# Patient Record
Sex: Female | Born: 1950
Health system: Southern US, Community
[De-identification: ages and names within clinical notes are randomized; demographics above are authoritative.]

## PROBLEM LIST (undated history)

## (undated) DIAGNOSIS — I5032 Chronic diastolic (congestive) heart failure: Secondary | ICD-10-CM

## (undated) DIAGNOSIS — E782 Mixed hyperlipidemia: Secondary | ICD-10-CM

## (undated) DIAGNOSIS — I639 Cerebral infarction, unspecified: Secondary | ICD-10-CM

## (undated) DIAGNOSIS — F32A Depression, unspecified: Secondary | ICD-10-CM

## (undated) DIAGNOSIS — R519 Headache, unspecified: Secondary | ICD-10-CM

## (undated) DIAGNOSIS — E663 Overweight: Secondary | ICD-10-CM

## (undated) DIAGNOSIS — G2581 Restless legs syndrome: Secondary | ICD-10-CM

## (undated) DIAGNOSIS — I219 Acute myocardial infarction, unspecified: Secondary | ICD-10-CM

## (undated) DIAGNOSIS — G4730 Sleep apnea, unspecified: Secondary | ICD-10-CM

## (undated) DIAGNOSIS — G9341 Metabolic encephalopathy: Secondary | ICD-10-CM

## (undated) DIAGNOSIS — M519 Unspecified thoracic, thoracolumbar and lumbosacral intervertebral disc disorder: Secondary | ICD-10-CM

## (undated) DIAGNOSIS — M1990 Unspecified osteoarthritis, unspecified site: Secondary | ICD-10-CM

## (undated) DIAGNOSIS — G459 Transient cerebral ischemic attack, unspecified: Secondary | ICD-10-CM

## (undated) DIAGNOSIS — M810 Age-related osteoporosis without current pathological fracture: Secondary | ICD-10-CM

## (undated) DIAGNOSIS — R5381 Other malaise: Secondary | ICD-10-CM

## (undated) DIAGNOSIS — R42 Dizziness and giddiness: Secondary | ICD-10-CM

## (undated) DIAGNOSIS — K5903 Drug induced constipation: Secondary | ICD-10-CM

## (undated) DIAGNOSIS — R61 Generalized hyperhidrosis: Secondary | ICD-10-CM

## (undated) DIAGNOSIS — E785 Hyperlipidemia, unspecified: Secondary | ICD-10-CM

## (undated) DIAGNOSIS — F419 Anxiety disorder, unspecified: Secondary | ICD-10-CM

## (undated) DIAGNOSIS — I2510 Atherosclerotic heart disease of native coronary artery without angina pectoris: Secondary | ICD-10-CM

## (undated) DIAGNOSIS — Z720 Tobacco use: Secondary | ICD-10-CM

## (undated) DIAGNOSIS — N3941 Urge incontinence: Secondary | ICD-10-CM

## (undated) DIAGNOSIS — K219 Gastro-esophageal reflux disease without esophagitis: Secondary | ICD-10-CM

## (undated) DIAGNOSIS — G4733 Obstructive sleep apnea (adult) (pediatric): Secondary | ICD-10-CM

## (undated) DIAGNOSIS — I119 Hypertensive heart disease without heart failure: Secondary | ICD-10-CM

## (undated) DIAGNOSIS — R51 Headache: Secondary | ICD-10-CM

## (undated) DIAGNOSIS — I739 Peripheral vascular disease, unspecified: Secondary | ICD-10-CM

## (undated) DIAGNOSIS — F329 Major depressive disorder, single episode, unspecified: Secondary | ICD-10-CM

## (undated) HISTORY — DX: Acute myocardial infarction, unspecified: I21.9

## (undated) HISTORY — DX: Tobacco use: Z72.0

## (undated) HISTORY — DX: Mixed hyperlipidemia: E78.2

## (undated) HISTORY — DX: Restless legs syndrome: G25.81

## (undated) HISTORY — DX: Other malaise: R53.81

## (undated) HISTORY — DX: Chronic diastolic (congestive) heart failure: I50.32

## (undated) HISTORY — DX: Overweight: E66.3

## (undated) HISTORY — DX: Metabolic encephalopathy: G93.41

## (undated) HISTORY — DX: Depression, unspecified: F32.A

## (undated) HISTORY — DX: Drug induced constipation: K59.03

## (undated) HISTORY — DX: Obstructive sleep apnea (adult) (pediatric): G47.33

## (undated) HISTORY — PX: EYE SURGERY: SHX253

## (undated) HISTORY — DX: Major depressive disorder, single episode, unspecified: F32.9

## (undated) HISTORY — DX: Dizziness and giddiness: R42

## (undated) HISTORY — DX: Generalized hyperhidrosis: R61

## (undated) HISTORY — DX: Peripheral vascular disease, unspecified: I73.9

## (undated) HISTORY — DX: Age-related osteoporosis without current pathological fracture: M81.0

## (undated) HISTORY — DX: Unspecified osteoarthritis, unspecified site: M19.90

## (undated) HISTORY — PX: KNEE ARTHROSCOPY: SUR90

## (undated) HISTORY — DX: Cerebral infarction, unspecified: I63.9

## (undated) HISTORY — PX: TUBAL LIGATION: SHX77

## (undated) HISTORY — DX: Urge incontinence: N39.41

## (undated) HISTORY — PX: LUMBAR LAMINECTOMY: SHX95

## (undated) HISTORY — DX: Anxiety disorder, unspecified: F41.9

## (undated) HISTORY — DX: Hypertensive heart disease without heart failure: I11.9

## (undated) HISTORY — PX: ILIAC ARTERY STENT: SHX1786

## (undated) HISTORY — PX: OTHER SURGICAL HISTORY: SHX169

## (undated) HISTORY — DX: Hyperlipidemia, unspecified: E78.5

---

## 1999-05-18 ENCOUNTER — Other Ambulatory Visit: Admission: RE | Admit: 1999-05-18 | Discharge: 1999-05-18 | Payer: Self-pay | Admitting: Obstetrics and Gynecology

## 1999-05-30 ENCOUNTER — Encounter: Payer: Self-pay | Admitting: Obstetrics and Gynecology

## 1999-05-30 ENCOUNTER — Ambulatory Visit (HOSPITAL_COMMUNITY): Admission: RE | Admit: 1999-05-30 | Discharge: 1999-05-30 | Payer: Self-pay | Admitting: Obstetrics and Gynecology

## 1999-06-23 ENCOUNTER — Encounter: Payer: Self-pay | Admitting: Gastroenterology

## 1999-06-28 ENCOUNTER — Ambulatory Visit (HOSPITAL_COMMUNITY): Admission: RE | Admit: 1999-06-28 | Discharge: 1999-06-28 | Payer: Self-pay | Admitting: Otolaryngology

## 1999-06-28 ENCOUNTER — Encounter: Payer: Self-pay | Admitting: Otolaryngology

## 1999-06-30 ENCOUNTER — Encounter (INDEPENDENT_AMBULATORY_CARE_PROVIDER_SITE_OTHER): Payer: Self-pay | Admitting: Specialist

## 1999-06-30 ENCOUNTER — Other Ambulatory Visit: Admission: RE | Admit: 1999-06-30 | Discharge: 1999-06-30 | Payer: Self-pay | Admitting: Otolaryngology

## 1999-07-24 ENCOUNTER — Encounter (INDEPENDENT_AMBULATORY_CARE_PROVIDER_SITE_OTHER): Payer: Self-pay | Admitting: *Deleted

## 1999-12-28 ENCOUNTER — Encounter: Payer: Self-pay | Admitting: Otolaryngology

## 1999-12-28 ENCOUNTER — Encounter: Admission: RE | Admit: 1999-12-28 | Discharge: 1999-12-28 | Payer: Self-pay | Admitting: Otolaryngology

## 2004-07-31 ENCOUNTER — Ambulatory Visit (HOSPITAL_COMMUNITY): Admission: RE | Admit: 2004-07-31 | Discharge: 2004-07-31 | Payer: Self-pay | Admitting: Neurosurgery

## 2004-08-30 ENCOUNTER — Observation Stay (HOSPITAL_COMMUNITY): Admission: RE | Admit: 2004-08-30 | Discharge: 2004-09-01 | Payer: Self-pay | Admitting: Neurosurgery

## 2008-11-10 ENCOUNTER — Ambulatory Visit (HOSPITAL_COMMUNITY): Admission: AD | Admit: 2008-11-10 | Discharge: 2008-11-10 | Payer: Self-pay | Admitting: Vascular Surgery

## 2008-11-10 ENCOUNTER — Ambulatory Visit: Payer: Self-pay | Admitting: Vascular Surgery

## 2008-11-25 ENCOUNTER — Ambulatory Visit: Payer: Self-pay | Admitting: *Deleted

## 2008-12-02 ENCOUNTER — Ambulatory Visit: Payer: Self-pay | Admitting: Cardiology

## 2008-12-02 ENCOUNTER — Ambulatory Visit (HOSPITAL_COMMUNITY): Admission: RE | Admit: 2008-12-02 | Discharge: 2008-12-02 | Payer: Self-pay | Admitting: Cardiology

## 2008-12-03 ENCOUNTER — Inpatient Hospital Stay (HOSPITAL_COMMUNITY): Admission: EM | Admit: 2008-12-03 | Discharge: 2008-12-05 | Payer: Self-pay | Admitting: Emergency Medicine

## 2008-12-03 ENCOUNTER — Ambulatory Visit: Payer: Self-pay | Admitting: Surgery

## 2008-12-03 ENCOUNTER — Encounter: Payer: Self-pay | Admitting: Cardiology

## 2008-12-29 ENCOUNTER — Ambulatory Visit: Payer: Self-pay | Admitting: *Deleted

## 2008-12-30 ENCOUNTER — Ambulatory Visit: Payer: Self-pay | Admitting: *Deleted

## 2009-01-17 ENCOUNTER — Ambulatory Visit: Payer: Self-pay | Admitting: *Deleted

## 2009-04-07 ENCOUNTER — Ambulatory Visit: Payer: Self-pay | Admitting: *Deleted

## 2009-07-07 ENCOUNTER — Encounter (INDEPENDENT_AMBULATORY_CARE_PROVIDER_SITE_OTHER): Payer: Self-pay | Admitting: *Deleted

## 2009-10-12 ENCOUNTER — Ambulatory Visit: Payer: Self-pay | Admitting: Vascular Surgery

## 2009-11-11 ENCOUNTER — Ambulatory Visit: Payer: Self-pay | Admitting: Vascular Surgery

## 2010-01-22 DIAGNOSIS — I2510 Atherosclerotic heart disease of native coronary artery without angina pectoris: Secondary | ICD-10-CM

## 2010-01-22 HISTORY — DX: Atherosclerotic heart disease of native coronary artery without angina pectoris: I25.10

## 2010-02-10 ENCOUNTER — Ambulatory Visit: Payer: Self-pay | Admitting: Internal Medicine

## 2010-02-13 ENCOUNTER — Encounter (INDEPENDENT_AMBULATORY_CARE_PROVIDER_SITE_OTHER): Payer: Self-pay | Admitting: *Deleted

## 2010-03-07 ENCOUNTER — Encounter (INDEPENDENT_AMBULATORY_CARE_PROVIDER_SITE_OTHER): Payer: Self-pay | Admitting: *Deleted

## 2010-04-25 ENCOUNTER — Ambulatory Visit: Payer: Self-pay | Admitting: Cardiology

## 2010-08-31 ENCOUNTER — Inpatient Hospital Stay (HOSPITAL_COMMUNITY): Admission: EM | Admit: 2010-08-31 | Discharge: 2010-02-13 | Payer: Self-pay | Admitting: Internal Medicine

## 2010-10-12 ENCOUNTER — Ambulatory Visit
Admission: RE | Admit: 2010-10-12 | Discharge: 2010-10-12 | Payer: Self-pay | Source: Home / Self Care | Attending: Vascular Surgery | Admitting: Vascular Surgery

## 2010-10-20 NOTE — Procedures (Unsigned)
AORTA-ILIAC DUPLEX EVALUATION  INDICATION:  Followup right external iliac artery stent.  HISTORY: Diabetes:  No. Cardiac:  No. Hypertension:  Yes. Smoking:  Yes. Previous Surgery:  Right external iliac artery stent on 11/10/2008.              SINGLE LEVEL ARTERIAL EXAM                             RIGHT                  LEFT Brachial:                  143                    150 Anterior tibial:           162                    154 Posterior tibial:          180                    169 Peroneal: Ankle/brachial index:      1.2                    1.13 Previous ABI/date:         10/12/09  1.14            1.05  AORTA-ILIAC DUPLEX EXAM Aorta - Proximal Aorta - Mid Aorta - Distal  RIGHT                                   LEFT 122 cm/s          CIA-PROXIMAL 72 cm/s           CIA-DISTAL Not visualized    HYPOGASTRIC 104 cm/s          EIA-PROXIMAL 133 cm/s          EIA-MID 151 cm/s          EIA-DISTAL    IMPRESSION: 1. Patent right external iliac artery stent with triphasic waveforms     and no significant increase in velocities noted in the right common     iliac and external iliac arteries. 2. Bilateral ankle brachial indices and Doppler waveforms suggest     normal perfusion of the bilateral lower extremities.  The bilateral     ankle brachial indices appears stable when compared to the previous     exam.  ___________________________________________ Janetta Hora. Fields, MD  CH/MEDQ  D:  10/13/2010  T:  10/13/2010  Job:  [id number]

## 2010-10-24 NOTE — Procedures (Signed)
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

## 2010-10-24 NOTE — Letter (Signed)
Summary: New Patient letter  [facility]  [address] [place], [place] [address]   Phone: [phone]  Fax: [phone]       03/07/2010 MRN: [id number]  Sonya Small [address] [place], [place]  [address]  Dear Ms. Stradford,  Welcome to the Gastroenterology Division at [facility].    You are scheduled to see Dr.  Russella Dar on 04/24/2010 at 2:30pm on the 3rd floor at [facility], [address]  We ask that you try to arrive at our office 15 minutes prior to your appointment time to allow for check-in.  We would like you to complete the enclosed self-administered evaluation form prior to your visit and bring it with you on the day of your appointment.  We will review it with you.  Also, please bring a complete list of all your medications or, if you prefer, bring the medication bottles and we will list them.  Please bring your insurance card so that we may make a copy of it.  If your insurance requires a referral to see a specialist, please bring your referral form from your primary care physician.  Co-payments are due at the time of your visit and may be paid by cash, check or credit card.     Your office visit will consist of a consult with your physician (includes a physical exam), any laboratory testing he/she may order, scheduling of any necessary diagnostic testing (e.g. x-ray, ultrasound, CT-scan), and scheduling of a procedure (e.g. Endoscopy, Colonoscopy) if required.  Please allow enough time on your schedule to allow for any/all of these possibilities.    If you cannot keep your appointment, please call [phone] to cancel or reschedule prior to your appointment date.  This allows [place] the opportunity to schedule an appointment for another patient in need of care.  If you do not cancel or reschedule by 5 p.m. the business day prior to your appointment date, you will be charged a $50.00 late cancellation/no-show fee.    Thank you for choosing   Gastroenterology for your medical needs.  We appreciate the opportunity to care for you.  Please visit [place] at our website  to learn more about our practice.                     Sincerely,                                                             The Gastroenterology Division

## 2010-10-24 NOTE — Procedures (Signed)
Summary: EGD/[facility]  EGD/[facility]   Imported By: Sherian Rein 03/07/2010 13:44:37  _____________________________________________________________________  External Attachment:    Type:   Image     Comment:   External Document

## 2010-10-24 NOTE — Progress Notes (Signed)
Summary: [facility]   Imported By: Sherian Rein 03/07/2010 13:45:51  _____________________________________________________________________  External Attachment:    Type:   Image     Comment:   External Document

## 2010-10-24 NOTE — Discharge Summary (Signed)
Summary: Acute non-ST-elevation myocardial infarction     NAME:  Sonya Small, Sonya Small                  ACCOUNT NO.:  [id number]      MEDICAL RECORD NO.:  [id number]          PATIENT TYPE:  INP      LOCATION:  2022                         FACILITY:  [facility]      PHYSICIAN:  [name]. [name], M.D.  DATE OF BIRTH:  1951/06/05      DATE OF ADMISSION:  02/10/2010   DATE OF DISCHARGE:  02/13/2010                                  DISCHARGE SUMMARY      HISTORY OF PRESENT ILLNESS:  Sonya Small is a 60 year old female with   history of tobacco abuse, hypertension, hyperlipidemia, and coronary   artery disease who presents with symptoms of chest pain.  She is status   post stenting of the left circumflex coronary artery in March 2010,   using a 3.0 x 12-mm Xience stent.  She had done well since then.  She   had a pharmacologic stress Cardiolite study 1 week ago that showed a   fixed small apical defect.  There was no other ischemia.  Left   ventricular function was normal.  Her aspirin and Plavix were held and   she underwent lumbar surgery 1 week ago at [facility].  She had not resumed   her Plavix yet and then started her aspirin back just 2 days prior.  On   the day of admission, the patient states she had a sudden onset of chest   discomfort.  Her pain persisted throughout the day.  She took some   nitroglycerin without relief.  The pain then began radiating into her   jaw and arms and she presented to the hospital.  Point-of-care cardiac   markers were abnormal with a troponin of 0.15.  ECG showed no acute   changes.  The patient was transferred to our facility for further   management.  For details of her past medical history, social history,   family history, and physical exam, please see admission history and   physical.      LABORATORY DATA:  ECG shows normal sinus rhythm with no ST or T-wave   changes.  Chest x-ray showed no active disease.  White count was 11,000,   hemoglobin 12.2, hematocrit 36.6,  platelets 370,000.  Coags were normal.   Sodium 138, potassium 3.6, chloride 104, CO2 of 26, glucose 148, BUN 17,   creatinine 0.73.  Total cholesterol is 146, triglycerides 163, HDL is   27, LDL 86.  Troponin peaked at 2.33, CPK was 160 with 23 MB.      HOSPITAL COURSE:  The patient was admitted to telemetry monitoring.  She   was started on IV heparin.  She was given IV nitroglycerin.  She was   loaded with p.o. Plavix and aspirin.  Given the time course of events,   we were very concerned about a possible acute stent thrombosis.  The   patient later in the morning developed recurrent chest pain.  She was   started on IV Integrilin in addition to her other medications.  Her pain   was resolved with IV morphine.  On Feb 10, 2010, she underwent   diagnostic cardiac catheterization.  This demonstrated the second   diagonal had a 50-70% stenosis in the midvessel.  The first obtuse   marginal vessel had a 20-30% proximal stenosis.  The stent in the mid   circumflex was widely patent.  Right coronary artery was a dominant   vessel.  We noted oscillating filling defects noted in the distal vessel   consistent with thrombus that were nonobstructive.  Left ventricular   angiography demonstrated distal inferior hypokinesia with akinesia and   ejection fraction of approximately 55%.  Based on these findings, it was   felt that her acute event was related to thrombotic lesion in the distal   right coronary artery.  Since this appeared to be nonobstructive, we   treated her with aggressive medical therapy.  She was maintained on IV   Integrilin for an additional 24 hours.  She was maintained on IV heparin   for 48 hours.  She was continued on aspirin and Plavix.  Blood pressure   control was achieved with her usual medications.  She continued to do   well.  The second hospital day, she still had an atypical chest   discomfort described as a pulling in her chest when she sat up.  This   also  resolved.  At the time of discharge, she had not had any recurrent   chest pain for 48 hours.  ECG showed no serial changes.  Her CBC and   BMET remained stable.  She was ambulated.  She was discharged home in   stable condition on Feb 13, 2010.      DISCHARGE DIAGNOSES:   1. Acute non-ST-elevation myocardial infarction secondary to       thrombotic disease in the distal right coronary artery.   2. Coronary artery disease status post stenting of the mid left       circumflex coronary artery.   3. Ongoing tobacco use.   4. Peripheral vascular disease status post right iliac stent.   5. Hypertension.   6. Hyperlipidemia.   7. Chronic obstructive pulmonary disease.   8. History of anxiety/depression.   9. Recent lumbar surgery.      DISCHARGE MEDICATIONS:   1. Alprazolam 0.25 mg at bedtime p.r.n.   2. Metoprolol 50 mg b.i.d.   3. Ranitidine 150 mg twice daily.   4. Aspirin 325 mg daily.   5. Citalopram 10 mg daily.   6. Cyclobenzaprine 10 mg p.r.n.   7. Fish oil 1000 mg daily.   8. Hydrochlorothiazide 25 mg daily.   9. Lisinopril 10 mg daily.   10.Nitroglycerin sublingual p.r.n.   11.Oxycodone 5 mg 1-2 tablets every 4 hours as needed.   12.Plavix 75 mg daily.   13.Pravastatin 40 mg 2 tablets daily.   14.Trilipix 135 mg daily.      The patient will follow up with Dr. [name] in 2 weeks.  She will   increase her activity slowly.  Her discharge status is improved.______________________________   [name]. [name], M.D.            PMJ/MEDQ  D:  02/13/2010  T:  02/13/2010  Job:  [id number]      cc:   Larina Earthly, M.D.   Lacretia Leigh. Quintella Reichert, M.D.      Electronically Signed by [name] [name] M.D. on 02/23/2010 09:09:59 AM

## 2010-11-01 ENCOUNTER — Ambulatory Visit (INDEPENDENT_AMBULATORY_CARE_PROVIDER_SITE_OTHER): Payer: Medicaid Other | Admitting: Cardiology

## 2010-11-01 DIAGNOSIS — F17200 Nicotine dependence, unspecified, uncomplicated: Secondary | ICD-10-CM

## 2010-11-01 DIAGNOSIS — I739 Peripheral vascular disease, unspecified: Secondary | ICD-10-CM

## 2010-11-01 DIAGNOSIS — E7800 Pure hypercholesterolemia, unspecified: Secondary | ICD-10-CM

## 2010-11-01 DIAGNOSIS — I2510 Atherosclerotic heart disease of native coronary artery without angina pectoris: Secondary | ICD-10-CM

## 2010-12-11 LAB — CBC
HCT: 36 % (ref 36.0–46.0)
Hemoglobin: 13 g/dL (ref 12.0–15.0)
MCHC: 33.2 g/dL (ref 30.0–36.0)
MCHC: 34.2 g/dL (ref 30.0–36.0)
MCV: 86.6 fL (ref 78.0–100.0)
MCV: 86.9 fL (ref 78.0–100.0)
Platelets: 327 10*3/uL (ref 150–400)
RBC: 4.17 MIL/uL (ref 3.87–5.11)
RBC: 4.39 MIL/uL (ref 3.87–5.11)
WBC: 11 10*3/uL — ABNORMAL HIGH (ref 4.0–10.5)
WBC: 9.5 10*3/uL (ref 4.0–10.5)

## 2010-12-11 LAB — BASIC METABOLIC PANEL
BUN: 12 mg/dL (ref 6–23)
CO2: 24 mEq/L (ref 19–32)
CO2: 26 mEq/L (ref 19–32)
Chloride: 104 mEq/L (ref 96–112)
Chloride: 106 mEq/L (ref 96–112)
GFR calc Af Amer: >60 mL/min (ref >60)
Glucose, Bld: 148 mg/dL — ABNORMAL HIGH (ref 70–99)
Potassium: 3.6 mEq/L (ref 3.5–5.1)
Potassium: 3.7 mEq/L (ref 3.5–5.1)
Sodium: 138 mEq/L (ref 135–145)

## 2010-12-11 LAB — LIPID PANEL
Cholesterol: 146 mg/dL (ref 0–200)
HDL: 27 mg/dL — ABNORMAL LOW (ref >39)
LDL Cholesterol: 86 mg/dL (ref 0–99)
Total CHOL/HDL Ratio: 5.4 RATIO
Triglycerides: 163 mg/dL — ABNORMAL HIGH (ref <150)

## 2010-12-11 LAB — DIFFERENTIAL
Basophils Relative: 0 % (ref 0–1)
Eosinophils Absolute: 0.1 10*3/uL (ref 0.0–0.7)
Eosinophils Absolute: 0.2 10*3/uL (ref 0.0–0.7)
Eosinophils Relative: 2 % (ref 0–5)
Lymphs Abs: 2.6 10*3/uL (ref 0.7–4.0)
Monocytes Absolute: 1 10*3/uL (ref 0.1–1.0)
Monocytes Relative: 9 % (ref 3–12)
Monocytes Relative: 9 % (ref 3–12)

## 2010-12-11 LAB — CARDIAC PANEL(CRET KIN+CKTOT+MB+TROPI)
CK, MB: 23.1 ng/mL (ref 0.3–4.0)
Relative Index: 14.4 — ABNORMAL HIGH (ref 0.0–2.5)
Total CK: 160 U/L (ref 7–177)

## 2010-12-11 LAB — APTT: aPTT: 32 seconds (ref 24–37)

## 2011-01-04 LAB — CBC
HCT: 38.7 % (ref 36.0–46.0)
Hemoglobin: 13.2 g/dL (ref 12.0–15.0)
Hemoglobin: 13.7 g/dL (ref 12.0–15.0)
MCHC: 34 g/dL (ref 30.0–36.0)
MCHC: 34.6 g/dL (ref 30.0–36.0)
MCV: 85.6 fL (ref 78.0–100.0)
Platelets: 243 10*3/uL (ref 150–400)
RBC: 4.68 MIL/uL (ref 3.87–5.11)
RDW: 13.9 % (ref 11.5–15.5)
RDW: 14.1 % (ref 11.5–15.5)
WBC: 13.4 10*3/uL — ABNORMAL HIGH (ref 4.0–10.5)

## 2011-01-04 LAB — BASIC METABOLIC PANEL
BUN: 13 mg/dL (ref 6–23)
BUN: 9 mg/dL (ref 6–23)
CO2: 23 mEq/L (ref 19–32)
CO2: 29 mEq/L (ref 19–32)
Calcium: 8.3 mg/dL — ABNORMAL LOW (ref 8.4–10.5)
Calcium: 8.3 mg/dL — ABNORMAL LOW (ref 8.4–10.5)
Chloride: 104 mEq/L (ref 96–112)
Chloride: 105 mEq/L (ref 96–112)
Creatinine, Ser: 0.62 mg/dL (ref 0.4–1.2)
Creatinine, Ser: 0.82 mg/dL (ref 0.4–1.2)
GFR calc non Af Amer: >60 mL/min (ref >60)
Glucose, Bld: 117 mg/dL — ABNORMAL HIGH (ref 70–99)
Glucose, Bld: 145 mg/dL — ABNORMAL HIGH (ref 70–99)
Potassium: 4.9 mEq/L (ref 3.5–5.1)

## 2011-01-04 LAB — CK TOTAL AND CKMB (NOT AT ARMC): Total CK: 85 U/L (ref 7–177)

## 2011-01-04 LAB — PROTIME-INR: Prothrombin Time: 12.5 seconds (ref 11.6–15.2)

## 2011-01-04 LAB — POCT CARDIAC MARKERS: Myoglobin, poc: 214 ng/mL (ref 12–200)

## 2011-01-04 LAB — COMPREHENSIVE METABOLIC PANEL
ALT: 18 U/L (ref 0–35)
AST: 26 U/L (ref 0–37)
Alkaline Phosphatase: 114 U/L (ref 39–117)
GFR calc Af Amer: >60 mL/min (ref >60)
Glucose, Bld: 131 mg/dL — ABNORMAL HIGH (ref 70–99)
Potassium: 3 mEq/L — ABNORMAL LOW (ref 3.5–5.1)
Sodium: 142 mEq/L (ref 135–145)
Total Protein: 6.4 g/dL (ref 6.0–8.3)

## 2011-01-04 LAB — APTT: aPTT: 31 seconds (ref 24–37)

## 2011-01-04 LAB — CARDIAC PANEL(CRET KIN+CKTOT+MB+TROPI)
Relative Index: 11.6 — ABNORMAL HIGH (ref 0.0–2.5)
Total CK: 361 U/L — ABNORMAL HIGH (ref 7–177)
Troponin I: 4.9 ng/mL (ref 0.00–0.06)
Troponin I: 6.27 ng/mL (ref 0.00–0.06)

## 2011-01-04 LAB — HEPARIN LEVEL (UNFRACTIONATED): Heparin Unfractionated: 0.17 IU/mL — ABNORMAL LOW (ref 0.30–0.70)

## 2011-01-25 ENCOUNTER — Encounter: Payer: Self-pay | Admitting: Family Medicine

## 2011-02-06 NOTE — Procedures (Signed)
BYPASS GRAFT EVALUATION   INDICATION:  Right first and fifth toe discoloration which has been  occurring for 3 weeks.   HISTORY:  Diabetes:  No.  Cardiac:  MI in February of 2010 by Dr. [name].  Hypertension:  Yes.  Smoking:  Less than pack per day.  Previous Surgery:  Right external iliac artery PTA and stent on  11/10/2008 by Dr. Madilyn Fireman.   SINGLE LEVEL ARTERIAL EXAM                               RIGHT              LEFT  Brachial:                    130                138  Anterior tibial:             138                122  Posterior tibial:            140                138  Peroneal:  Ankle/brachial index:        >1.0               1.0   PREVIOUS ABI:  Date:  RIGHT:  LEFT:   LOWER EXTREMITY BYPASS GRAFT DUPLEX EXAM:   DUPLEX:  Doppler arterial waveforms are triphasic throughout the  external iliac artery and common femoral artery with no evidence of  significant stenosis.   IMPRESSION:  1. ABI suggests no evidence of arterial occlusive disease bilaterally.  2. Patent right external iliac artery stent.   ___________________________________________  P. Liliane Bade, M.D.   MC/MEDQ  D:  04/07/2009  T:  04/07/2009  Job:  [id number]

## 2011-02-06 NOTE — Assessment & Plan Note (Signed)
OFFICE VISIT   Sonya [name], Sonya [name]  DOB:  1951/05/12                                       12/30/2008  CHART#:[id number]   The patient suffered an atheroembolic episode to her right lower  extremity in February of this year.  She underwent a diagnostic  arteriogram and angioplasty and stenting of a right external iliac  lesion.   Since last seen she has been evaluated by Dr. [name] [name], underwent  cardiac catheterization, apparently suffered a postprocedure MI and  required placement of 2 coronary stents.  She is on Plavix and aspirin.   Her complaints at present are that of some lower extremity swelling.  She notes some discomfort in the thighs bilaterally.  Aching discomfort  in her right foot where she suffered an atheroembolic shower.   On evaluation, she appears generally well.  BP 171/97, pulse of 69 per  minute.  Her femoral pulses are intact bilaterally.  No bruits.  Her  right foot has recovered remarkably well.  She is forming new skin.  I  do not feel she will lose any tissue.  She has a 2+ right dorsalis pedis  pulse.   Overall doing fairly well following her right iliac angioplasty.  Will  plan follow-up in 6 months with protocol Doppler evaluation.  Renewed a  prescription for Darvocet times 30 tablets.  Return sooner if any  problems should arise.   Balinda Quails, M.D.  Electronically Signed   PGH/MEDQ  D:  12/30/2008  T:  12/31/2008  Job:  [id number]   cc:   [name]. [name], M.D.

## 2011-02-06 NOTE — H&P (Signed)
Sonya [name], Sonya [name]                  ACCOUNT NO.:  [id number]   MEDICAL RECORD NO.:  [id number]          PATIENT TYPE:  OIB   LOCATION:  2899                         FACILITY:  [facility]   PHYSICIAN:  Quita Skye. Hart Rochester, M.D.  DATE OF BIRTH:  Apr 27, 1951   DATE OF ADMISSION:  11/10/2008  DATE OF DISCHARGE:  11/10/2008                              HISTORY & PHYSICAL   CHIEF COMPLAINT:  Pain and bluish discoloration of toes of right foot.   HISTORY OF PRESENT ILLNESS:  This 60 year old female patient has been  having pain in the right foot for 3 weeks.  This was preceded by severe  claudication symptoms in the right hip and thigh over the last several  months limiting her to walk less than half block.  She has no symptoms  in the contralateral left leg.  Today, she noted some bluish  discoloration of the foot and toes, and she went to the [facility] Department where she was evaluated with a CT  angiogram of the abdominal aorta with runoff.  This revealed a severe  stenosis in the right external iliac artery with the impression that  this had probably embolized to her right foot.  She was referred to [facility] for further evaluation.   PAST MEDICAL HISTORY:  1. Hypertension.  2. Negative for diabetes, coronary artery disease, COPD,      hyperlipidemia, or stroke.   PREVIOUS SURGERIES:  1. Lumbar laminectomy.  2. Hysterectomy.  3. Tubal ligation.   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   FAMILY HISTORY:  Negative for coronary artery disease, diabetes, or  stroke.   REVIEW OF SYSTEMS:  Denies any chest pain, dyspnea on exertion, PND,  orthopnea, or GU symptoms.  Denies any neurologic symptoms, such as,  hemiparesis, aphasia, amaurosis fugax, diplopia,  blurred vision, or  syncope.   PHYSICAL EXAMINATION:  VITAL SIGNS:  Blood pressure 170/80, heart rate  70, respirations 18.  GENERAL:  She is a middle-aged female, in no apparent distress, alert  and oriented x3.  NECK:  Supple, 3+ carotid pulses palpable.  No bruits are audible.  No  palpable adenopathy in the neck.  NEUROLOGIC:  Normal.  CHEST:  Clear to auscultation.  CARDIOVASCULAR:  Regular rhythm.  No murmurs.  ABDOMEN:  Obese.  No palpable masses.  EXTREMITIES:  Left leg has 3+ femoral, popliteal, and dorsalis pedis  pulse palpable.  Right leg has absent to 1+ femoral pulse.  No distal  pulses.  There is embolic bluish discoloration in the right first,  third, and fifth toes on the lateral aspect of the right foot.  There is  no dry gangrene or cellulitis or fluctuance, and the foot is slightly  mottled but does have capillary refill.   IMPRESSION:  1. Emboli to right foot from partially occlusive lesion in right      external iliac artery.  2. Tobacco abuse.  3. Hypertension.   Plan is to proceed with angiography and  possible percutaneous  transluminal angioplasty and stenting of right external iliac artery by  Dr. Madilyn Fireman.  Risks and benefits have been thoroughly discussed with Ms.  Vrba, who would like to proceed.   ALLERGIES:  None known on the history and physical.      Quita Skye. Hart Rochester, M.D.  Electronically Signed     JDL/MEDQ  D:  11/10/2008  T:  11/11/2008  Job:  [id number]

## 2011-02-06 NOTE — Cardiovascular Report (Signed)
Sonya [name], IRVING                  ACCOUNT NO.:  [id number]   MEDICAL RECORD NO.:  [id number]          PATIENT TYPE:  OIB   LOCATION:  2899                         FACILITY:  [facility]   PHYSICIAN:  [name]. [name], M.D.  DATE OF BIRTH:  Mar 12, 1951   DATE OF PROCEDURE:  12/02/2008  DATE OF DISCHARGE:  12/02/2008                            CARDIAC CATHETERIZATION   INDICATION FOR PROCEDURE:  A 60 year old white female with history of  hypertension, hypercholesterolemia, tobacco abuse, and peripheral  vascular disease presents with symptoms of chest pain, dyspnea on  exertion, and fatigue.   PROCEDURES:  Left heart catheterization, coronary and left ventricular  angiography.   EQUIPMENT USED:  6-French 4 cm right and left Judkins catheter, 6-French  pigtail catheter, 6-French arterial sheath.  Access is via the left  femoral artery using the standard Seldinger technique.   MEDICATIONS:  Local anesthesia:  1% Xylocaine, Versed 2 mg IV.  Contrast:  100 mL of Omnipaque.   HEMODYNAMIC DATA:  Aortic pressure 189/89 with a mean of 131 mmHg.  Left  ventricular pressure is 187 with EDP of 21 mmHg.   ANGIOGRAPHIC DATA:  Left coronary arises and distributes normally.  The  left main coronary artery is normal.   The left anterior descending artery with a large vessel and appears  normal.  There is a small first diagonal branch which has a 20%  narrowing proximally.   The left circumflex coronary artery gives rise to 2 marginal branches.  There is a 30% narrowing in the mid circumflex.   The right coronary artery is a dominant vessel.  It has a 10-20%  irregularity in the midvessel.   Left ventricular angiography was performed in the RAO view.  This  demonstrates normal left ventricular size and contractility with normal  systolic function.  Ejection fraction is estimated at 55%.  There is no  significant mitral insufficiency.   FINAL INTERPRETATION:  1. Minor nonobstructive  atherosclerotic coronary artery disease.  2. Normal left ventricular function.   PLAN:  We would recommend to continue risk factor modification.           ______________________________  [name]. [name], M.D.     PMJ/MEDQ  D:  12/02/2008  T:  12/03/2008  Job:  [id number]   cc:   Balinda Quails, M.D.  Lacretia Leigh. Quintella Reichert, M.D.

## 2011-02-06 NOTE — Procedures (Signed)
BYPASS GRAFT EVALUATION   INDICATION:  Followup right external iliac stent.   HISTORY:  Diabetes:  No.  Cardiac:  MI in February of 2010.  Hypertension:  Yes.  Smoking:  Yes.  Previous Surgery:  Right external iliac stent on 11/10/2008.   SINGLE LEVEL ARTERIAL EXAM                               RIGHT              LEFT  Brachial:                    121                129  Anterior tibial:             144                130  Posterior tibial:            142                136  Peroneal:  Ankle/brachial index:        1.12               1.05   PREVIOUS ABI:  Date:  04/07/2009  RIGHT:  >1.0  LEFT:  1.0   LOWER EXTREMITY BYPASS GRAFT DUPLEX EXAM:   DUPLEX:  Patent right external iliac stent with biphasic waveforms  noted.   IMPRESSION:  1. Normal ankle brachial indices bilaterally.  2. Patent right external iliac artery stent.   ___________________________________________  Janetta Hora. Fields, MD   CB/MEDQ  D:  10/12/2009  T:  10/12/2009  Job:  [id number]

## 2011-02-06 NOTE — H&P (Signed)
NAMELINDALEE, HUIZINGA                  ACCOUNT NO.:  [id number]   MEDICAL RECORD NO.:  [id number]          PATIENT TYPE:  OIB   LOCATION:                               FACILITY:  [facility]   PHYSICIAN:  [name]. [name], M.D.  DATE OF BIRTH:  02-Sep-1951   DATE OF ADMISSION:  12/02/2008  DATE OF DISCHARGE:                              HISTORY & PHYSICAL   HISTORY OF PRESENT ILLNESS:  Ms. Byrns is a 60 year old white female who  is seen at the request of Dr. Madilyn Fireman for evaluation of chest pain.  The  patient has known history of significant peripheral vascular disease.  She is status post stenting of the right iliac artery on November 10, 2008.  She has a known history of hypercholesterolemia and hypertension.  She is a chronic smoker.  She reports having some type of nuclear stress  test approximately 5 years ago in [place].  More recently, she has been  experiencing symptoms of significant fatigue and dyspnea on exertion and  chest pain.  She states her chest pain is described as a tightness.  Occasionally, she feels her heart is speeding up as well.  Given her  fairly typical anginal symptoms and multiple cardiac risk factors, it  was recommended she undergo cardiac evaluation with cardiac  catheterization, and she is now admitted for that purpose.   PAST MEDICAL HISTORY:  1. Hypertension.  2. Hypercholesterolemia.  3. Status post stenting of the right external iliac artery.  4. Status post knee surgery, arthroscopic, in 1984.  5. History of hysterectomy at age 14.  55. History eye surgery at age 23.  4. History of osteoporosis.  8. History of lumbar laminectomy.  9. Status post tubal ligation.   SOCIAL HISTORY:  The patient works as a Neurosurgeon.  She smokes 1 pack  per day since approximately 30 years.  She denies alcohol use.  She is  single and has 3 children.   FAMILY HISTORY:  Father died at age 30, accident.  Mother died at age 55  with Alzheimer disease.  She has 4 siblings,  one sister has a history of  heart disease.   REVIEW OF SYSTEMS:  She still has numbness and discomfort in her right  foot and toe.  She still has bluish black discoloration of the right big  toe and pinky toe.  She has had no edema, orthopnea, or PND.  She denies  any bleeding difficulties.  She has had no history of TIA or stroke.  Appetite has been good.  She has had no recent change in bowel or  bladder habits.  All other systems are reviewed and are negative.   PHYSICAL EXAMINATION:  GENERAL:  The patient is a pleasant white female,  in no distress.  VITAL SIGNS:  Weight is 179, blood pressure 152/98, pulse is 80 and  regular, respirations were normal.  HEENT:  She is normocephalic, atraumatic.  Pupils equal, round, and  reactive to light and accommodation.  Extraocular movements were full.  Oropharynx is clear.  NECK:  Supple without JVD, adenopathy, thyromegaly,  or bruits.  There  are no subclavian bruits.  LUNGS:  Clear to auscultation and percussion.  CARDIAC:  Regular rate and rhythm without gallop, murmur, rub, or click.  ABDOMEN:  Obese, soft, nontender without mass or bruits.  EXTREMITIES:  She has excellent femoral pulses bilaterally.  She has  good pedal pulses bilaterally.  There is evidence of embolic disease to  her right foot with bluish black discoloration of the right first and  fifth toe.  NEUROLOGIC:  She is alert and oriented x4.  Cranial nerves II-XII are  intact.   CURRENT MEDICATIONS:  1. Oxycodone p.r.n.  2. Pravastatin 40 mg per day.  3. Triamterene/hydrochlorothiazide 75/50 mg daily.  4. Plavix 75 mg daily.  5. Omeprazole 20 mg per day.  6. Aspirin 81 mg per day.   LABORATORY DATA:  Her BMET is normal.  Coags are normal.  CBC shows a  white count of 15,400, otherwise, is normal.   Her ECG at rest shows normal sinus rhythm, possible anterior infarct,  age undetermined.   IMPRESSION:  1. Typical symptoms of angina with dyspnea on exertion, chest  pain,      and fatigue in patient with multiple cardiac risk factors.  2. Peripheral vascular disease status post right external iliac      stenting with some dry gangrene in her right fifth toe.  3. Hypertension.  4. Hypercholesterolemia.  5. Tobacco abuse.   PLAN:  We will proceed with diagnostic cardiac catheterization with  further therapy pending these results.           ______________________________  [name]. [name], M.D.     PMJ/MEDQ  D:  11/30/2008  T:  11/30/2008  Job:  [id number]   cc:   Balinda Quails, M.D.  Lacretia Leigh. Quintella Reichert, M.D.

## 2011-02-06 NOTE — H&P (Signed)
NAMEMAHOGONY, GILCHREST                  ACCOUNT NO.:  [id number]   MEDICAL RECORD NO.:  [id number]          PATIENT TYPE:  INP   LOCATION:  2603                         FACILITY:  [facility]   PHYSICIAN:  Darryl D. Prime, MD    DATE OF BIRTH:  July 03, 1951   DATE OF ADMISSION:  12/02/2008  DATE OF DISCHARGE:                              HISTORY & PHYSICAL   The patient is full code.   PRIMARY CARE PHYSICIAN:  Lacretia Leigh. Quintella Reichert, MD   CARDIOLOGIST:  [name]. [name], MD   The patient is here for chest pain.   HISTORY OF PRESENT ILLNESS:  Ms. Penn is a 60 year old female with a  history of peripheral vascular disease.  She had a possible embolic  phenomenon with the right blue toe in February 2010 and had on the 17th,  a right iliac stent placed in February 2010.  She does still have a dry  gangrene of first and second toes on the right.  She has a history of  hyperlipidemia, hypertension, and tobacco abuse, who has been having  shortness of breath, chest pain, and fatigue.  The patient describes  dyspnea on exertion over the last year that has been progressive and she  thinks it is related to smoking and she has had chest pain and chest  tightness occasionally, but mild over the last 10 years.  She has had a  recent worsening symptoms and because of the significant peripheral  vascular disease, she was referred to Dr. [name] for possible cardiac  catheterization.  She had a stress test greater than 5 years ago that  was negative per patient.  She underwent cardiac catheterization today  and she notes the results were unremarkable and left around 7:00 p.m.  She notes at home, however, tonight a sudden onset of chest pain,  substernal, sharp sensation and a squeezing sensation at the same time,  10/10, radiating to the jaw and to the bilateral arms and elbows.  She  has never had pain like this before.  She called EMS after taking half a  Percocet.  She was given sublingual nitroglycerin x3 and  morphine, which  helped the pain somewhat.  The patient notes chronic wheezing and yellow  productive cough, but this is chronic.  The patient in the emergency  room was still having mild chest discomfort and admission was requested.   PAST MEDICAL HISTORY:  As above.  She has also a history of status post  right knee surgery, arthroscopic in 1984; status post hysterectomy at  age 44; status post eye surgery at age 38; history of osteoporosis; and  history of lumbar laminectomy in 2005.   ALLERGIES:  No known drug allergies.   MEDICATIONS:  She is on;  1. Oxycodone as stated.  2. Pravastatin 40 mg daily.  3. Triamterene and hydrochlorothiazide 75/50 daily.  4. Plavix 75 mg daily.  5. Aspirin 81 mg daily.  6. Omeprazole 20 mg daily.   SOCIAL HISTORY:  She lives with her sister.  She has 3 children.  She is  single.  History of  tobacco for 30 years, 1 pack per day.  No alcohol.  She is a Museum/gallery conservator.   FAMILY HISTORY:  Mother died with complications of Alzheimer's at age  65.  Father died of an accident.  No premature coronary artery disease  in the family.   REVIEW OF SYSTEMS:  Positive for persistent numbness and tingling in the  right toe and foot, and discoloration of the right toe, first and  second.   PHYSICAL EXAMINATION:  VITAL SIGNS:  Blood pressure on the right forearm  is 171/64 by cuff and on the left 166/84.  The patient's temperature,  she is afebrile, pulse of 83, respiratory rate of 14, and sats 98% on  room air.  GENERAL:  She is a female that seems to be in no acute distress, lying  flat in bed.  HEENT:  Normocephalic, atraumatic.  Pupils are equal, round, and  reactive to light with the extraocular movements being intact.  The  oropharynx shows no posterior oropharyngeal lesion.  NECK:  Supple with no lymphadenopathy or thyromegaly.  No carotid  bruits.  No jugular venous distention.  CARDIOVASCULAR:  Regular rhythm and rate with normal S1 and S2.  No S3  or  S4.  Point of maximal impulse is not displaced.  LUNGS:  Clear to auscultation bilaterally.  No wheezing.  SKIN:  Discoloration with first and second toe on the right.  She has  decreased pulses, right side greater than the left dorsalis pedis.  Her  femoral access site on the left is clean, dry and intact with no signs  of hematoma.  ABDOMEN:  Soft, nontender, and nondistended with no hepatosplenomegaly.  EXTREMITIES:  No clubbing, cyanosis, or edema.  MUSCULOSKELETAL:  No signs of joint deformity, effusions, or CVA  tenderness.  NEUROLOGIC:  She is alert and oriented x4.  Cranial nerves II-XII are  grossly intact.  Strength and sensation is grossly intact.   Chest x-ray showed no acute cardiopulmonary disease.  There is no  widening of her mediastinum.  EKG showed sinus rhythm at a vent rate of  83 beats per minute with normal axis.  PR interval 136, QRS 90, QT  corrected 456.  She has a possible anterior infarct.  No major change  from EKG in 2005.  White count is 10.7 with hemoglobin of 13.2,  hematocrit of 38.2, and platelets 278.  Cardiac markers are pending as  well as the basic metabolic panel.   ASSESSMENT AND PLAN:  This is a patient with a history of significant  peripheral vascular disease who had a cardiac catheterization today with  typical angina.  She now has severe, sudden chest pain and is concerned  for pulmonary embolus or dissection.  Given her recent history, as she  has had to lay flat, she is at risk for pulmonary embolus, and given her  history for recent catheterization and significant peripheral vascular  disease, she is at risk for dissection or embolic phenomenon from her  aorta to a coronary artery.  At this time, we will get a CT scan of the  chest to rule out dissection and to rule out pulmonary embolism and also  ultrasound of the lower extremities to rule out DVT.  We will follow her  cardiac markers closely.  If the findings on the CT are negative  for  dissection, we will start anticoagulation while we cycle her markers.  She will be continued on her statin therapy, aspirin, and Plavix.  We  will  also, as her blood pressure is elevated, give her beta-blockers IV  then p.o. and then also nitrates.  She will be followed closely in a  level 2 bed.  DVT and GI prophylaxis will be with a proton pump  inhibitor.  DVT prophylaxis with pneumatic compression devices for now.      Darryl D. Prime, MD  Electronically Signed     DDP/MEDQ  D:  12/03/2008  T:  12/03/2008  Job:  [id number]

## 2011-02-06 NOTE — Assessment & Plan Note (Signed)
OFFICE VISIT   KAISLEE, CHAO  DOB:  21-Aug-1951                                       11/11/2009  CHART#:[id number]   The patient presents today for followup of lower extremity arterial  pathology.  She was an established patient of Dr. Liliane Bade.  She  presented initially with emboli to her right foot on 11/10/2008.  She  underwent arteriogram and right external iliac percutaneous transluminal  angioplasty of and a SMART stent placement.  She initially had recovery  of the tissue loss on her right foot with no amputations required.  She  had been seen recently and had noninvasive vascular laboratory studies  revealing normal ankle arm indices bilaterally with normal waveforms  bilaterally.  She requested followup today due to multiple complaints.  She reports that she falls frequently.  She has no energy and is  depressed.  She reports that she has cramping in her feet and legs at  night.  She reports that both legs give out.  She also reports some  discomfort in her right groin at her prior puncture site.  Her symptoms  can occur with walking and she reports a discomfort across her lower  back extending into her legs with numbness and burning.  She does have a  history of prior back surgery.  She does not have any other episodes of  tissue loss.   REVIEW OF SYSTEMS:  Is unchanged from in the past.  She has no new  cardiac difficulties since her prior coronary stenting and does not have  any weight loss or weight gain.   SOCIAL HISTORY:  She unfortunately does continue to smoke cigarettes.   PHYSICAL EXAM:  General:  A well-developed, well-nourished white female  appearing stated age of 39, in no acute distress.  Vital signs:  Blood  pressure is 122/80, pulse 68, respirations 18, her temperature is 97.9.  HEENT:  Normal.  Musculoskeletal:  No major deformities or cyanosis.  Neurological:  No weakness or paresthesias.  Skin:  No ulcers or rashes.  She  does have 2+ dorsalis pedis pulses bilaterally and 2+ femoral pulses  bilaterally.  Her right groin does not show any evidence of false  aneurysm or other abnormality from her prior groin puncture.   I reviewed her recent ultrasound with the patient and her family  present.  I explained that I do not see any evidence of arterial  insufficiency which would cause her multiple symptoms.  I explained that  this sounds to be more neurologic than vascular.  They had requested  that we refer her to Dr. Timoteo Ace in [place], [place], and we will  attempt to do this.  She has a followup with Dr. [name] in 1 month.  She  will continue to be followed in our noninvasive vascular lab per  protocol.     Larina Earthly, M.D.  Electronically Signed   TFE/MEDQ  D:  11/11/2009  T:  11/11/2009  Job:  [id number]   cc:   [name]. [name], M.D.

## 2011-02-06 NOTE — Cardiovascular Report (Signed)
Sonya [name], Sonya [name] NO.:  [id number]   MEDICAL RECORD NO.:  [id number]          PATIENT TYPE:  INP   LOCATION:  2505                         FACILITY:  [facility]   PHYSICIAN:  [name]. [name], M.D.  DATE OF BIRTH:  Apr 17, 1951   DATE OF PROCEDURE:  DATE OF DISCHARGE:                            CARDIAC CATHETERIZATION   INDICATIONS FOR PROCEDURE:  The patient is a 60 year old white female  who presented with recent increase in chest pain.  She has peripheral  vascular disease and has had a recent right iliac stent.  She has a  history of hypertension, hypercholesterolemia, and tobacco abuse.  The  patient underwent cardiac catheterization on December 02, 2008.  This  demonstrated nonobstructive coronary artery disease with only a 40%  stenosis in the mid circumflex.  She returned; however, that night with  recurrent chest pain and had positive cardiac troponins.  She was  brought back to the cardiac catheterization for reevaluation.   PROCEDURE:  Left heart catheterization, coronary left ventricular  angiography.   EQUIPMENT USED:  A 6-French 4-cm right and left Judkins catheter, 6-  French pigtail catheter, 6-French arterial sheath, 6-French FL-4 guide,  a 0.014 High-torque Floppy wire, a 2.5 x 12-mm apex balloon, a 3.0 x 12-  mm XIENCE stent, and a 3.25 x 8-mm Quantum Maverick balloon.   MEDICATIONS:  1. Local anesthesia 1% Xylocaine.  2. Nitroglycerin 200 mcg intracoronary.  3. Versed a total of 3 mg IV.  4. Fentanyl 50 mcg IV.  5. Angiomax bolus of 0.75 mg/kg followed by continuous infusion of      1.75 mg/kg per hour.   CONTRAST:  160 mL of Omnipaque.   HEMODYNAMIC DATA:  Aortic pressure is 154/77 with a mean of 106.  Left  ventricle pressure was 150 with EDP of 11.   ANGIOGRAPHIC DATA:  The left coronary artery arises and distributes  normally.  Left main coronary artery is normal.   The left anterior descending artery appears normal.  There is a  small  diagonal branch, which has a 20% narrowing proximally.   The left circumflex coronary artery gives rise to a large first marginal  vessel and then a second marginal vessel.  In the midvessel, there is an  eccentric 80% stenosis.  There is TIMI grade III flow.   The right coronary artery is a dominant vessel and has less than 10-20%  irregularities in the midvessel.   Left ventricular angiography was performed in the LAO cranial view.  This demonstrates minimal hypokinesia of the distal lateral wall.  Overall normal left ventricular function.   We proceeded with stenting of the mid circumflex lesion.  After  anticoagulation, we predilated that lesion using a 2.5 x 12-mm Apex  balloon up to 6 atmospheres.  This did reproduce her clinical symptoms  of chest and jaw pain.  We then stented the lesion using a 3.0 x 12-mm  IM stent deploying this at 8 atmospheres.  The stent was postdilated  with a 3.25 x 8-mm Quantum Maverick balloon up to 12 atmospheres x2.  This  yielded excellent angiographic result with 0% residual stenosis and  TIMI grade III flow.   FINAL ASSESSMENT:  1. Single-vessel obstructive coronary artery disease.  2. Good left ventricular function.  3. Successful stenting of the mid left circumflex coronary.           ______________________________  [name]. [name], M.D.     PMJ/MEDQ  D:  12/03/2008  T:  12/04/2008  Job:  [id number]   cc:   Lacretia Leigh. Quintella Reichert, M.D.  Balinda Quails, M.D.

## 2011-02-06 NOTE — Op Note (Signed)
[name], [name]                  ACCOUNT NO.:  [id number]   MEDICAL RECORD NO.:  [id number]          PATIENT TYPE:  OIB   LOCATION:  2899                         FACILITY:  [facility]   PHYSICIAN:  Balinda Quails, M.D.    DATE OF BIRTH:  19-Sep-1951   DATE OF PROCEDURE:  11/10/2008  DATE OF DISCHARGE:  11/10/2008                               OPERATIVE REPORT   PHYSICIAN:  Denman George, MD   DIAGNOSIS:  Atheroemboli right foot.   PROCEDURE:  1. Right lower extremity arteriogram.  2. Right external iliac percutaneous transluminal angioplasty and      stent (Smart 8 mm x 40 mm).   ACCESS:  Right common femoral artery 6-French sheath.   CONTRAST:  90 mL of Visipaque.   COMPLICATIONS:  None apparent.   CLINICAL NOTE:  Sonya [name] is a 60 year old female heavy tobacco user  who developed a painful discolored right foot over the past couple of  weeks.  She was seen in the emergency department at [name].  CT angiography revealed evidence of a severe right external iliac artery  stenosis.  Brought to the Cath Lab at this time for diagnostic  arteriogram and possible intervention.  The patient was preloaded with  Plavix and aspirin.   PROCEDURE NOTE:  The patient was brought to the Cath Lab in stable  condition.  Placed in a supine position.  Both groins were prepped and  draped in a sterile fashion.  Administered 50 mcg of fentanyl  intravenously.  Xylocaine 1% instilled in the right groin.  An 18-gauge  needle introduced into the right common femoral artery.  A 0.035 Wholey  guidewire advanced through the needle into the mid abdominal aorta under  fluoroscopy.  The needle removed and a short 6-French sheath advanced  over the guidewire.  The dilator removed and sheath flushed with heparin  saline solution.   Right lower extremity arteriography was obtained.  In the LAO  projection, retrograde left iliac arteriogram obtained.  This verified a  high-grade stenosis of the  proximal left external iliac artery.  This  was estimated to be greater than 90%.   A right lower extremity runoff arteriogram was obtained.  This revealed  the common femoral and profunda femoris to be patent.  The right  superficial, femoral and popliteal arteries were intact.  Tibial runoff  was via the anterior tibial and peroneal arteries.  The right posterior  tibial artery was occluded.   The patient was administered 5000 units of heparin intravenously.  An 8  x 40 Smart stent was then advanced over the guidewire and positioned at  the area of maximal stenosis and deployed.  This was then postdilated  with a 7 mm x 40 Powerflex balloon at 10 atmospheres for 60 seconds.   A completion arteriogram revealed an excellent technical result without  residual stenosis.   The patient tolerated the procedure well.  No apparent complications.   FINAL IMPRESSION:  1. Atheroemboli right foot.  2. High-grade right proximal right external iliac artery stenosis.  3. Successful stenting with angioplasty  right external iliac artery      stenosis.   DISPOSITION:  The patient will be prescribed Plavix and aspirin along  with Percocet for pain control.   PLAN:  Discharge today with follow up in the office in 2 weeks.      Balinda Quails, M.D.  Electronically Signed     PGH/MEDQ  D:  11/10/2008  T:  11/11/2008  Job:  [id number]   cc:   Lacretia Leigh. Quintella Reichert, M.D.

## 2011-02-06 NOTE — Assessment & Plan Note (Signed)
OFFICE VISIT   ARIZONA, SORN  DOB:  12-15-1950                                       11/25/2008  CHART#:[id number]   The patient suffered an atheroembolic episode to her right foot and was  seen at [facility] on 02/17/29010.  Workup for this revealed a  severe right external iliac artery stenosis and she underwent  angioplasty and stenting.   She returns at this time for a check of her foot.  She does have  atheroembolic changes in the right first toe and fifth toe.  2+ right  dorsalis pedis and posterior tibial pulses.   To continue aspirin 81 mg daily and Plavix 75 mg daily.  Once the 6  weeks of Plavix is completed continue aspirin 325 mg daily.   She does note shortness of breath and some chest tightness with  ambulation, very concerning for coronary artery disease.  Referred to  [facility] for evaluation and workup.   Balinda Quails, M.D.  Electronically Signed   PGH/MEDQ  D:  11/25/2008  T:  11/26/2008  Job:  [id number]   cc:   Lacretia Leigh. Quintella Reichert, M.D.  Colleen Can. Deborah Chalk, M.D.

## 2011-02-09 NOTE — H&P (Signed)
NAMEGREYDIS, STLOUIS                  ACCOUNT NO.:  [id number]   MEDICAL RECORD NO.:  [id number]          PATIENT TYPE:  INP   LOCATION:  3038                         FACILITY:  [facility]   PHYSICIAN:  Hilda Lias, M.D.   DATE OF BIRTH:  1951-01-25   DATE OF ADMISSION:  08/30/2004  DATE OF DISCHARGE:                                HISTORY & PHYSICAL   HISTORY OF PRESENT ILLNESS:  [name] is a lady who had been complaining of  back pain with radiation to both legs.  This problem had been going on for  many years and although it is mostly going to the left side, nevertheless,  she complained of some pain down into the right leg.  The patient has failed  with conservative treatment.  X-rays showed that she has a lumbar stenosis  at the level of 4-5.  Surgery was advised.   PAST MEDICAL HISTORY:   ALLERGIES:  She is not allergic to any medication.   SOCIAL HISTORY:  She used to smoke a pack a day, but she stopped smoking  about a week ago.   FAMILY HISTORY:  Unremarkable.   REVIEW OF SYSTEMS:  Review of systems positive for back pain, pain in the  shoulder, shortness of breath and balance disturbance.   PHYSICAL EXAMINATION:  HEENT:  Normal.  NECK:  Normal.  LUNGS:  Clear.  HEART:  Heart sounds normal.  ABDOMEN:  Normal.  EXTREMITIES:  Normal pulses.  NEUROLOGIC:  Mental status normal.  Cranial nerves normal.  Strength shows  weakness on dorsiflexion of both feet.  She has some decrease in flexibility  of the lumbar spine.  Reflexes are symmetrical.  Sensation normal.   IMAGING STUDIES:  The lumbar spine x-ray showed that she had a herniated  disk at the level of 4-5, mostly going to the left side.  Also in the right  side, she has foraminal stenosis.   CLINICAL IMPRESSION:  L4-L5 herniated disk with stenosis.   RECOMMENDATION:  The patient is being admitted for surgery and the procedure  will be an L4-L5 bilateral foraminotomy and diskectomy according to the  findings.  The  risks were explained to her in the office and include  infection, CSF leak, worsening in the pain, paralysis and need for further  surgery.       EB/MEDQ  D:  09/01/2004  T:  09/01/2004  Job:  [id number]

## 2011-02-09 NOTE — Op Note (Signed)
Sonya Small, Sonya Small                  ACCOUNT NO.:  [id number]   MEDICAL RECORD NO.:  [id number]          PATIENT TYPE:  INP   LOCATION:  3038                         FACILITY:  [facility]   PHYSICIAN:  Hilda Lias, M.D.   DATE OF BIRTH:  May 19, 1951   DATE OF PROCEDURE:  08/30/2004  DATE OF DISCHARGE:                                 OPERATIVE REPORT   PREOPERATIVE DIAGNOSIS:  L4-5 stenosis with central and foraminal, rule out  herniated disk.   POSTOPERATIVE DIAGNOSIS:  L4-5 stenosis with central and foraminal, rule out  herniated disk.   OPERATION PERFORMED:  Bilateral L4-5 laminotomy.  Decompression of the L4-5  nerve root with foraminotomy.  Microscope.   SURGEON:  Hilda Lias, M.D.   ASSISTANT:  Stefani Dama, M.D.   ANESTHESIA:  General.   INDICATIONS FOR PROCEDURE:  The patient was admitted because of back pain  with radiation to both legs.  The patient was scheduled for surgery but she  had to have a cardiovascular work-up.  Right now she is complaining of back  pain with radiation to both legs.  Surgery was fully explained including the  possibility of CSF leak, no improvement whatsoever, need for further surgery  which might require fusion.   DESCRIPTION OF PROCEDURE:  The patient was taken to the operating room and  she was positioned in prone manner.  The back was prepped with Betadine.  Midline incision from L4 to L5 was made.  X-ray showed that indeed we were  at the level 4-5.  With the microscope we drilled the lower lamina of L4 and  L5 bilaterally.  A thick yellow ligament was also excised.  In the left  side, we found that indeed the canal was quite narrow.  With the 1, 2 and 3  mm Kerrison punch, we did the foraminotomy, decompressing the L5 and L4  nerve roots as well as the dural sac.  We retracted the nerve root and the  disk was a little bulging but there was no need for any diskectomy.  The  same procedure was done on the right side with the same  finding.  No  diskectomy was accomplished but good decompression of the L4 and L5 was  achieved.  The Valsalva maneuver was negative although there was a little  bit of arachnoid pouch.  Tisseel was used bilaterally.  From then on the  area was irrigated and the wound was closed with Vicryl and Steri-Strips.       EB/MEDQ  D:  08/30/2004  T:  08/31/2004  Job:  [id number]

## 2011-02-09 NOTE — Discharge Summary (Signed)
Sonya Small, SOUCY NO.:  [id number]   MEDICAL RECORD NO.:  [id number]          PATIENT TYPE:  INP   LOCATION:  4714                         FACILITY:  [facility]   PHYSICIAN:  [name]. [name], M.D.  DATE OF BIRTH:  06/02/1951   DATE OF ADMISSION:  12/03/2008  DATE OF DISCHARGE:  12/05/2008                               DISCHARGE SUMMARY   HISTORY OF PRESENT ILLNESS:  Ms. Nash is a 60 year old white female  with history of peripheral vascular disease.  She has had previous  angioplasty of the right iliac artery in February 2010.  She has a  history of hyperlipidemia, hypertension, and tobacco abuse.  The patient  did have a cardiac catheterization earlier in the day, which appeared to  be nonobstructive disease.  On subsequent discharge to home, the patient  developed acute substernal chest pain and dyspnea.  She also had  bilateral elbow pain and jaw pain.  She presented back to the emergency  room for further evaluation.   For details of her past medical history, social history, family history,  and physical exam, please see admission history and physical.   LABORATORY DATA:  Her chest x-ray showed no active disease.  ECG showed  normal sinus rhythm with possible old anterior myocardial infarction.  There were no acute ST or T-wave changes.  White count 11,700,  hemoglobin 13.2, hematocrit 38.2, platelets 278,000.  Coags were normal.  Sodium 138, potassium 3.0, chloride 99, CO2 of 23, BUN 9, creatinine  0.82, glucose 145.  Subsequent potassium normalized to 4.9.  Liver  function studies were normal.  Albumin was 3.2.  Initial CPK was 85 with  6.9 MB then rose to 361 with 42 MB and then 271 with 27.9 MB, troponin  was 0.34 and then increased to 6.27 and then 4.90.   HOSPITAL COURSE:  The patient was treated with beta-blocker.  She was  continued on aspirin and Plavix.  She underwent a CT of the abdomen and  pelvis.  This demonstrated mild aortic atherosclerosis  without aneurysm.  There was dependent ground-glass opacity in the lungs.  There were no  other active findings in the chest.  Abdominal CT showed again moderate  aortic atherosclerosis without evidence of dissection.  There was a 2-cm  right adrenal gland nodule.  There was a left inguinal hematoma without  peritoneal component.   The patient continued to experience symptoms of chest pain.  Because of  her refractory pain and negative CT findings and positive cardiac  enzymes, we recommended repeat cardiac catheterization, this was  performed on December 03, 2008.  This demonstrated eccentric 8% stenosis in  the mid left circumflex coronary.  This was felt to be the most likely  culprit.  Otherwise, she had only scattered irregularities.  Left  ventricular angiography showed minimal distal lateral hypokinesia.  We  therefore went ahead and proceeded with stenting of the mid circumflex  using a 3.0 x 12 mm Xience stent.  This postdilated to 3.25 mm.  This  yielded excellent result with 0% residual stenosis and TIMI grade  3  flow.  The patient had complete resolution of her chest pain.  She had  no serial ECG changes.  She was progressively ambulated and discharged  home in stable condition on December 05, 2008.   DISCHARGE DIAGNOSES:  1. Non-Q-wave myocardial infarction with culprit lesion in the mid      left circumflex coronary artery.  2. Peripheral vascular disease.  3. Hypertension.  4. Hyperlipidemia.  5. Gastroesophageal reflux disease.   DISCHARGE MEDICATIONS:  1. Altace 5 mg per day.  2. Aspirin 325 mg per day.  3. Lopressor 50 mg 1-1/2 tablets 2 times daily.  4. Omeprazole 20 mg per day.  5. Plavix 75 mg per day.  6. Pravachol 80 mg per day.  7. Nitroglycerin sublingual p.r.n.   She was instructed to stop taking Maxzide.  She will increase her  activity slowly with no lifting for 1 week.  She will remain on low-  sodium, heart-healthy diet.  She will follow up with Dr.  [name] in 1-2  weeks.   DISCHARGE STATUS:  Improved.           ______________________________  [name]. [name], M.D.     PMJ/MEDQ  D:  01/20/2009  T:  01/20/2009  Job:  [id number]   cc:   Titus Dubin. Alwyn Ren, MD,FACP,FCCP

## 2011-03-02 ENCOUNTER — Telehealth: Payer: Self-pay | Admitting: *Deleted

## 2011-03-02 NOTE — Telephone Encounter (Signed)
Pt called with 90 day supply of plavix/arrived.Alfonso Ramus RN

## 2011-03-07 ENCOUNTER — Telehealth: Payer: Self-pay | Admitting: Cardiology

## 2011-03-07 NOTE — Telephone Encounter (Signed)
Called wanting a refill of Nexium and Alprazolam at [facility]. Please call back.

## 2011-03-08 ENCOUNTER — Other Ambulatory Visit: Payer: Self-pay | Admitting: *Deleted

## 2011-03-08 MED ORDER — ALPRAZOLAM 0.25 MG PO TABS: 0.2500 mg | ORAL_TABLET | Freq: Every evening | ORAL | Status: DC | PRN

## 2011-03-08 MED ORDER — ESOMEPRAZOLE MAGNESIUM 40 MG PO CPDR: 40.0000 mg | DELAYED_RELEASE_CAPSULE | Freq: Every day | ORAL | Status: DC

## 2011-03-08 NOTE — Telephone Encounter (Signed)
Faxed refill

## 2011-03-30 ENCOUNTER — Other Ambulatory Visit: Payer: Self-pay | Admitting: *Deleted

## 2011-03-30 MED ORDER — CITALOPRAM HYDROBROMIDE 20 MG PO TABS: 20.0000 mg | ORAL_TABLET | Freq: Every day | ORAL | Status: DC

## 2011-03-30 NOTE — Telephone Encounter (Signed)
escribe medication per fax request  

## 2011-05-02 ENCOUNTER — Encounter: Payer: Self-pay | Admitting: Cardiology

## 2011-05-02 ENCOUNTER — Telehealth: Payer: Self-pay | Admitting: Cardiology

## 2011-05-02 NOTE — Telephone Encounter (Signed)
lm

## 2011-05-02 NOTE — Telephone Encounter (Signed)
Called stating she just got back from [place]. But she was having chest pain,SOB,and pain in both arms last night. Didn't want to go to ER. Not having pain now. Will work her in tomorrow but advised needs to go to ER if has further pain and take NTG. States she did take NTG last PM w/relief.

## 2011-05-02 NOTE — Telephone Encounter (Signed)
Wants to speak with you regarding her recent chest discomfort last night. She said that she is in [place] [facility] and will be leaving for home soon. She has an appointment Sept.7th and wants to know if she should be seen sooner.

## 2011-05-03 ENCOUNTER — Ambulatory Visit (INDEPENDENT_AMBULATORY_CARE_PROVIDER_SITE_OTHER): Payer: Medicaid Other | Admitting: Cardiology

## 2011-05-03 ENCOUNTER — Encounter: Payer: Self-pay | Admitting: Cardiology

## 2011-05-03 DIAGNOSIS — E785 Hyperlipidemia, unspecified: Secondary | ICD-10-CM

## 2011-05-03 DIAGNOSIS — I214 Non-ST elevation (NSTEMI) myocardial infarction: Secondary | ICD-10-CM

## 2011-05-03 DIAGNOSIS — Z720 Tobacco use: Secondary | ICD-10-CM

## 2011-05-03 DIAGNOSIS — I739 Peripheral vascular disease, unspecified: Secondary | ICD-10-CM

## 2011-05-03 DIAGNOSIS — I2510 Atherosclerotic heart disease of native coronary artery without angina pectoris: Secondary | ICD-10-CM

## 2011-05-03 DIAGNOSIS — E782 Mixed hyperlipidemia: Secondary | ICD-10-CM | POA: Insufficient documentation

## 2011-05-03 DIAGNOSIS — F17200 Nicotine dependence, unspecified, uncomplicated: Secondary | ICD-10-CM

## 2011-05-03 NOTE — Assessment & Plan Note (Signed)
Her exam today reveals that good pedal pulses. I think some of her pain in her feet may be neuropathic. She was started on Lyrica by primary care. Her last lower extremity Doppler studies in early February were good.

## 2011-05-03 NOTE — Assessment & Plan Note (Signed)
I am pleased she quit smoking. I encouraged her with her continued efforts.

## 2011-05-03 NOTE — Patient Instructions (Signed)
Continue your current therapy.  I will get a copy of your lab work from Dr. Sedalia Muta  I will see you again in 6 months.  Congratulations on quitting smoking.

## 2011-05-03 NOTE — Assessment & Plan Note (Signed)
She did have one episode of chest pain recently. I have counseled her on the use of nitroglycerin. I think unless she has recurrent symptoms I would not recommend further evaluation at this time given her extensive evaluation from one year ago. If she should have recurrent chest pain we would need to consider repeating a nuclear stress test.

## 2011-05-03 NOTE — Progress Notes (Signed)
Sonya Small Date of Birth: Feb 15, 1951   History of Present Illness: Sonya Small is seen as a work in today. She reports that 2 nights ago she awoke with chest pain with some radiation to both arms and into her throat. It lasted 10-15 minutes and then resolved. She did not take nitroglycerin. This is the only episode of discomfort she has had. She has quit smoking. She has had no increase in edema orthopnea.  Current Outpatient Prescriptions on File Prior to Visit  Medication Sig Dispense Refill  . ALPRAZolam (XANAX) 0.25 MG tablet Take 0.5 mg by mouth daily.        [name] aspirin 325 MG tablet Take 325 mg by mouth daily.        [name] atorvastatin (LIPITOR) 80 MG tablet Take 80 mg by mouth daily.        [name] buPROPion (WELLBUTRIN XL) 300 MG 24 hr tablet Take 300 mg by mouth daily.        . Choline Fenofibrate (TRILIPIX) 135 MG capsule Take 135 mg by mouth daily.        . clopidogrel (PLAVIX) 75 MG tablet Take 75 mg by mouth daily.        [name] esomeprazole (NEXIUM) 40 MG capsule Take 1 capsule (40 mg total) by mouth daily before breakfast.  30 capsule  5  . fish oil-omega-3 fatty acids 1000 MG capsule Take 4 g by mouth daily.        . hydrochlorothiazide 25 MG tablet Take 25 mg by mouth daily.        [name] HYDROcodone-acetaminophen (NORCO) 5-325 MG per tablet Take 1 tablet by mouth every 6 (six) hours as needed.        [name] lisinopril (PRINIVIL,ZESTRIL) 10 MG tablet Take 10 mg by mouth daily.        . metoprolol (TOPROL-XL) 50 MG 24 hr tablet Take 50 mg by mouth daily.        . nitroGLYCERIN (NITROSTAT) 0.4 MG SL tablet Place 0.4 mg under the tongue every 5 (five) minutes as needed.        . ALPRAZolam (XANAX) 0.25 MG tablet Take 1 tablet (0.25 mg total) by mouth at bedtime as needed for sleep.  30 tablet  5  . pravastatin (PRAVACHOL) 80 MG tablet Take 80 mg by mouth daily.          No Known Allergies  Past Medical History  Diagnosis Date  . Coronary artery disease   . Non Q wave myocardial infarction  01/2010    RELATED TO THROMBOSIS OF THE DISTAL RIGHT CORONARY   . PAD (peripheral artery disease)   . Anxiety   . Depression   . Hyperlipidemia   . Tobacco abuse   . Overweight   . OP (osteoporosis)     Past Surgical History  Procedure Date  . Cardiac catheterization 12/03/2008    MINIMAL HYPOKINESIA OF THE DISTAL WALL. NORMAL LV FUNCTION  . Iliac artery stent     RIGHT ILIAC STENT  . Knee arthroscopy   . Lumbar laminectomy   . Tubal ligation   . Cardiovascular stress test 02/02/2010    EF 65%    History  Smoking status  . Former Smoker  . Quit date: 02/22/2010  Smokeless tobacco  . Not on file    History  Alcohol Use No    Family History  Problem Relation Age of Onset  . Alzheimer's disease Mother     Review of Systems: The  review of systems is positive for chronic pain in both feet. Sometimes her feet turn a little bit purple. This is non-positional..  All other systems were reviewed and are negative.  Physical Exam: BP 110/60  Pulse 68  Ht 5\' 4"  (1.626 m)  Wt 185 lb (83.915 kg)  BMI 31.76 kg/m2 She is a pleasant white female in no acute distress. She is normocephalic, atraumatic. Pupils are equal round and reactive to light accommodation. Extraocular movements are full. Oropharynx is clear. Neck is supple without JVD, adenopathy, thyromegaly, or bruits. Lungs are clear. Cardiac exam reveals a regular rate and rhythm without gallop, murmur, or click. Abdomen is soft and nontender. She has no masses or bruits. Extremities are without edema. Her feet are warm and dry. There is no discoloration. She has good capillary refill. Dorsalis pedis  pulses are easily palpable bilaterally. Neurologic exam is nonfocal. LABORATORY DATA: Date Jan 25, 2011 chemistry panel was normal. Hemoglobin was 15.1. Total cholesterol 176, triglycerides 162, HDL 40, LDL 104. LDL particle was 2228. She was switched from pravastatin to Lipitor at that time. ECG today is normal.  Assessment /  Plan:

## 2011-05-07 ENCOUNTER — Other Ambulatory Visit: Payer: Self-pay | Admitting: *Deleted

## 2011-05-07 MED ORDER — HYDROCHLOROTHIAZIDE 12.5 MG PO TABS: ORAL_TABLET | ORAL | Status: DC

## 2011-05-07 NOTE — Telephone Encounter (Signed)
escribe medication per fax request  

## 2011-05-17 ENCOUNTER — Telehealth: Payer: Self-pay | Admitting: Cardiology

## 2011-05-17 NOTE — Telephone Encounter (Signed)
Spoke w/Dr [name]. She has seen Mrs. Munley and is c/o SOB. She would like her to have an Echo. Advised her that we had not had an Echo w/ [place] or at [facility]. Will schedule and send her a copy. She states she didn't receive our office note from 8/9. Will fax to [phone]. OK with Dr. [name]

## 2011-05-17 NOTE — Telephone Encounter (Signed)
Dr. Sedalia Muta calling to speak about pt's latest Echo performed. Dr. Sedalia Muta states it is all right to call back.  Please call back.  Chart in box

## 2011-05-18 ENCOUNTER — Other Ambulatory Visit: Payer: Self-pay | Admitting: *Deleted

## 2011-05-18 DIAGNOSIS — R0600 Dyspnea, unspecified: Secondary | ICD-10-CM

## 2011-05-24 ENCOUNTER — Ambulatory Visit (HOSPITAL_COMMUNITY): Payer: Medicare Other | Attending: Cardiology | Admitting: Radiology

## 2011-05-24 DIAGNOSIS — R072 Precordial pain: Secondary | ICD-10-CM

## 2011-05-24 DIAGNOSIS — E669 Obesity, unspecified: Secondary | ICD-10-CM | POA: Insufficient documentation

## 2011-05-24 DIAGNOSIS — I079 Rheumatic tricuspid valve disease, unspecified: Secondary | ICD-10-CM | POA: Insufficient documentation

## 2011-05-24 DIAGNOSIS — I080 Rheumatic disorders of both mitral and aortic valves: Secondary | ICD-10-CM | POA: Insufficient documentation

## 2011-05-24 DIAGNOSIS — R0600 Dyspnea, unspecified: Secondary | ICD-10-CM

## 2011-05-24 DIAGNOSIS — F17200 Nicotine dependence, unspecified, uncomplicated: Secondary | ICD-10-CM | POA: Insufficient documentation

## 2011-05-24 DIAGNOSIS — E785 Hyperlipidemia, unspecified: Secondary | ICD-10-CM | POA: Insufficient documentation

## 2011-05-29 NOTE — Progress Notes (Signed)
lm

## 2011-05-30 ENCOUNTER — Telehealth: Payer: Self-pay | Admitting: Cardiology

## 2011-05-30 NOTE — Telephone Encounter (Signed)
Notified of Echo results. Will send copy to Dr. [name]

## 2011-05-30 NOTE — Telephone Encounter (Signed)
Returned your phone call.  Please call back. 

## 2011-05-30 NOTE — Telephone Encounter (Signed)
Message copied by Lorayne Bender on Wed May 30, 2011 10:41 AM ------      Message from: [name], [name]      Created: Fri May 25, 2011 10:15 AM       Echo looks great!      Theron Arista [name]

## 2011-06-01 ENCOUNTER — Ambulatory Visit: Payer: Medicaid Other | Admitting: Cardiology

## 2011-06-08 ENCOUNTER — Other Ambulatory Visit: Payer: Self-pay | Admitting: *Deleted

## 2011-06-08 MED ORDER — LISINOPRIL 10 MG PO TABS: 10.0000 mg | ORAL_TABLET | Freq: Every day | ORAL | Status: DC

## 2011-06-08 MED ORDER — CHOLINE FENOFIBRATE 135 MG PO CPDR: 135.0000 mg | DELAYED_RELEASE_CAPSULE | Freq: Every day | ORAL | Status: DC

## 2011-06-08 NOTE — Telephone Encounter (Signed)
escribe medication per fax request  

## 2011-06-20 ENCOUNTER — Other Ambulatory Visit: Payer: Self-pay | Admitting: Cardiology

## 2011-06-20 MED ORDER — CLOPIDOGREL BISULFATE 75 MG PO TABS: 75.0000 mg | ORAL_TABLET | Freq: Every day | ORAL | Status: DC

## 2011-06-20 NOTE — Telephone Encounter (Signed)
escribe medication per fax request  

## 2011-06-20 NOTE — Telephone Encounter (Signed)
Pt wants refill of plavix only two pills left

## 2011-09-14 ENCOUNTER — Telehealth: Payer: Self-pay | Admitting: Cardiology

## 2011-09-14 MED ORDER — ESOMEPRAZOLE MAGNESIUM 40 MG PO CPDR: 40.0000 mg | DELAYED_RELEASE_CAPSULE | Freq: Every day | ORAL | Status: DC

## 2011-09-14 NOTE — Telephone Encounter (Signed)
[facility] pt requesting refill of nexium

## 2011-10-30 ENCOUNTER — Encounter: Payer: Self-pay | Admitting: Cardiology

## 2011-10-30 ENCOUNTER — Ambulatory Visit (INDEPENDENT_AMBULATORY_CARE_PROVIDER_SITE_OTHER): Payer: Medicare Other | Admitting: Cardiology

## 2011-10-30 VITALS — BP 110/68 | HR 68 | Ht 65.0 in | Wt 185.6 lb

## 2011-10-30 DIAGNOSIS — I739 Peripheral vascular disease, unspecified: Secondary | ICD-10-CM

## 2011-10-30 DIAGNOSIS — E785 Hyperlipidemia, unspecified: Secondary | ICD-10-CM

## 2011-10-30 DIAGNOSIS — I779 Disorder of arteries and arterioles, unspecified: Secondary | ICD-10-CM

## 2011-10-30 DIAGNOSIS — Z720 Tobacco use: Secondary | ICD-10-CM

## 2011-10-30 DIAGNOSIS — I2510 Atherosclerotic heart disease of native coronary artery without angina pectoris: Secondary | ICD-10-CM

## 2011-10-30 DIAGNOSIS — F17200 Nicotine dependence, unspecified, uncomplicated: Secondary | ICD-10-CM

## 2011-10-30 MED ORDER — CLOPIDOGREL BISULFATE 75 MG PO TABS: 75.0000 mg | ORAL_TABLET | Freq: Every day | ORAL | Status: DC

## 2011-10-30 MED ORDER — LISINOPRIL 10 MG PO TABS: 10.0000 mg | ORAL_TABLET | Freq: Every day | ORAL | Status: DC

## 2011-10-30 NOTE — Assessment & Plan Note (Signed)
She remains asymptomatic from a cardiac standpoint. She will continue on aspirin and Plavix therapy but I have recommended that she reduce her aspirin to 81 mg daily. We will continue with risk factor modification.

## 2011-10-30 NOTE — Assessment & Plan Note (Signed)
She is currently on high-dose Lipitor and Trilipix. She is scheduled for fasting lab work in April.

## 2011-10-30 NOTE — Assessment & Plan Note (Signed)
I have encouraged her efforts at smoking cessation.

## 2011-10-30 NOTE — Assessment & Plan Note (Addendum)
She has chronic claudication symptoms. She is scheduled for followup with vascular surgery later this month. I have encouraged her to walk as much as possible.

## 2011-10-30 NOTE — Patient Instructions (Signed)
You may reduce your ASA to 81 mg daily.  Continue your other medications.  I will see you again in 6 months.

## 2011-10-30 NOTE — Progress Notes (Signed)
Sonya Small Date of Birth: June 08, 1951   History of Present Illness: Sonya Small is seen for a followup visit. She reports that she is doing well. Her biggest complaint is that her legs bother her. It hurts for her to cross her legs and it hurts when she is walking. She is scheduled to see Dr. Arbie Cookey in 2 weeks. Sometimes her legs changed colors she has had some swelling. She has had some shortness of breath and she is now on both Advair and Combivent inhalers. She has had no chest pain. She reports that she is no longer smoking.  Current Outpatient Prescriptions on File Prior to Visit  Medication Sig Dispense Refill  . ALPRAZolam (XANAX) 0.25 MG tablet Take 0.5 mg by mouth daily. Takes twice daily      . aspirin 325 MG tablet Take 325 mg by mouth daily.        [name] atorvastatin (LIPITOR) 80 MG tablet Take 80 mg by mouth daily.        [name] buPROPion (WELLBUTRIN XL) 300 MG 24 hr tablet Take 300 mg by mouth daily.        . Choline Fenofibrate (TRILIPIX) 135 MG capsule Take 1 capsule (135 mg total) by mouth daily.  30 capsule  5  . esomeprazole (NEXIUM) 40 MG capsule Take 1 capsule (40 mg total) by mouth daily before breakfast.  30 capsule  5  . fish oil-omega-3 fatty acids 1000 MG capsule Take 4 g by mouth daily.        . hydrochlorothiazide (HYDRODIURIL) 12.5 MG tablet Take one tablet daily  30 tablet  5  . metoprolol (TOPROL-XL) 50 MG 24 hr tablet Take 50 mg by mouth daily.        . nitroGLYCERIN (NITROSTAT) 0.4 MG SL tablet Place 0.4 mg under the tongue every 5 (five) minutes as needed.        [name] DISCONTD: clopidogrel (PLAVIX) 75 MG tablet Take 1 tablet (75 mg total) by mouth daily.  30 tablet  5  . DISCONTD: lisinopril (PRINIVIL,ZESTRIL) 10 MG tablet Take 1 tablet (10 mg total) by mouth daily.  30 tablet  5  . ALPRAZolam (XANAX) 0.25 MG tablet Take 1 tablet (0.25 mg total) by mouth at bedtime as needed for sleep.  30 tablet  5    No Known Allergies  Past Medical History  Diagnosis Date  .  Coronary artery disease   . Non Q wave myocardial infarction 01/2010    RELATED TO THROMBOSIS OF THE DISTAL RIGHT CORONARY   . PAD (peripheral artery disease)   . Anxiety   . Depression   . Hyperlipidemia   . Tobacco abuse   . Overweight   . OP (osteoporosis)     Past Surgical History  Procedure Date  . Cardiac catheterization 12/03/2008    MINIMAL HYPOKINESIA OF THE DISTAL WALL. NORMAL LV FUNCTION  . Iliac artery stent     RIGHT ILIAC STENT  . Knee arthroscopy   . Lumbar laminectomy   . Tubal ligation   . Cardiovascular stress test 02/02/2010    EF 65%    History  Smoking status  . Former Smoker  . Quit date: 02/22/2010  Smokeless tobacco  . Not on file    History  Alcohol Use No    Family History  Problem Relation Age of Onset  . Alzheimer's disease Mother     Review of Systems: The review of systems is positive for chronic pain in both feet.  Sometimes her feet turn a little bit purple. This is non-positional..  All other systems were reviewed and are negative.  Physical Exam: BP 110/68  Pulse 68  Ht 5\' 5"  (1.651 m)  Wt 185 lb 9.6 oz (84.188 kg)  BMI 30.89 kg/m2 She is a pleasant white female in no acute distress. She is normocephalic, atraumatic. Pupils are equal round and reactive to light accommodation. Extraocular movements are full. Oropharynx is clear. Neck is supple without JVD, adenopathy, thyromegaly, or bruits. Lungs are clear. Cardiac exam reveals a regular rate and rhythm without gallop, murmur, or click. Abdomen is soft and nontender. She has no masses or bruits. Extremities are without edema. Her feet are warm and dry. There is no discoloration. She has good capillary refill. Dorsalis pedis  pulses are easily palpable bilaterally. Neurologic exam is nonfocal. LABORATORY DATA:   Assessment / Plan:

## 2011-11-07 ENCOUNTER — Encounter: Payer: Self-pay | Admitting: Physician Assistant

## 2011-11-08 ENCOUNTER — Encounter (INDEPENDENT_AMBULATORY_CARE_PROVIDER_SITE_OTHER): Payer: Medicare Other | Admitting: *Deleted

## 2011-11-08 ENCOUNTER — Other Ambulatory Visit (INDEPENDENT_AMBULATORY_CARE_PROVIDER_SITE_OTHER): Payer: Medicare Other | Admitting: *Deleted

## 2011-11-08 DIAGNOSIS — Z48812 Encounter for surgical aftercare following surgery on the circulatory system: Secondary | ICD-10-CM

## 2011-11-08 DIAGNOSIS — I743 Embolism and thrombosis of arteries of the lower extremities: Secondary | ICD-10-CM

## 2011-11-16 ENCOUNTER — Encounter: Payer: Self-pay | Admitting: Vascular Surgery

## 2011-11-16 NOTE — Procedures (Unsigned)
AORTA-ILIAC DUPLEX EVALUATION  INDICATION:  Right external iliac artery stent.  HISTORY: Diabetes:  No. Cardiac:  No. Hypertension:  Yes. Smoking:  Yes. Previous Surgery:  Right external iliac artery PTA/stent on 11/10/2008.              SINGLE LEVEL ARTERIAL EXAM                             RIGHT                  LEFT Brachial: Anterior tibial: Posterior tibial: Peroneal: Ankle/brachial index: Previous ABI/date:  AORTA-ILIAC DUPLEX EXAM Aorta - Proximal     Not visualized Aorta - Mid          Not visualized Aorta - Distal       67 cm/s  RIGHT                                   LEFT 102 Cm/s          CIA-PROXIMAL 136 cm/s          CIA-DISTAL Not visualized    HYPOGASTRIC 129 cm/s          EIA-PROXIMAL 109 cm/s          EIA-MID 190 cm/s          EIA-DISTAL  IMPRESSION: 1. Patent right external iliac artery stent with no evidence of     hemodynamically significant stenosis. 2. Bilateral ankle brachial indices are noted on a separate report.  ___________________________________________ Larina Earthly, M.D.  CH/MEDQ  D:  11/08/2011  T:  11/08/2011  Job:  [id number]

## 2011-12-17 ENCOUNTER — Other Ambulatory Visit: Payer: Self-pay | Admitting: Rehabilitation

## 2011-12-17 DIAGNOSIS — M545 Low back pain: Secondary | ICD-10-CM

## 2011-12-25 ENCOUNTER — Ambulatory Visit
Admission: RE | Admit: 2011-12-25 | Discharge: 2011-12-25 | Disposition: A | Discharge status: Home / Self Care | Payer: Medicare Other | Source: Ambulatory Visit | Attending: Rehabilitation | Admitting: Rehabilitation

## 2011-12-25 DIAGNOSIS — M545 Low back pain: Secondary | ICD-10-CM

## 2011-12-25 MED ORDER — GADOBENATE DIMEGLUMINE 529 MG/ML IV SOLN
15.0000 mL | Freq: Once | INTRAVENOUS | Status: AC | PRN
  Administered 2011-12-25: 15 mL via INTRAVENOUS

## 2012-05-21 ENCOUNTER — Ambulatory Visit (INDEPENDENT_AMBULATORY_CARE_PROVIDER_SITE_OTHER): Payer: Medicare Other | Admitting: Cardiology

## 2012-05-21 ENCOUNTER — Encounter: Payer: Self-pay | Admitting: Cardiology

## 2012-05-21 VITALS — BP 117/71 | HR 61 | Ht 65.0 in | Wt 192.0 lb

## 2012-05-21 DIAGNOSIS — I2510 Atherosclerotic heart disease of native coronary artery without angina pectoris: Secondary | ICD-10-CM

## 2012-05-21 DIAGNOSIS — I10 Essential (primary) hypertension: Secondary | ICD-10-CM

## 2012-05-21 DIAGNOSIS — I739 Peripheral vascular disease, unspecified: Secondary | ICD-10-CM

## 2012-05-21 DIAGNOSIS — E786 Lipoprotein deficiency: Secondary | ICD-10-CM

## 2012-05-21 NOTE — Patient Instructions (Signed)
Stop smoking  Get more exercise.  I will get your lab work from Dr. Sedalia Muta.  I will see you again in 6 months.

## 2012-05-21 NOTE — Progress Notes (Signed)
Sonya Small Date of Birth: 12-09-1950   History of Present Illness: Sonya Small is seen for a followup visit. Sonya Small reports that Sonya Small has been doing well from a cardiac standpoint without any significant symptoms of chest pain. Her walking activity is predominantly limited by dyspnea and lower back pain. Sonya Small denies any significant claudication symptoms. Sonya Small still has some discoloration of her feet when they are dangling. Sonya Small has resumed smoking but states Sonya Small is smoking less than one pack per day.  Current Outpatient Prescriptions on File Prior to Visit  Medication Sig Dispense Refill  . ADVAIR DISKUS 500-50 MCG/DOSE AEPB       . ALPRAZolam (XANAX) 0.25 MG tablet Take 1 tablet (0.25 mg total) by mouth at bedtime as needed for sleep.  30 tablet  5  . ALPRAZolam (XANAX) 0.25 MG tablet Take 0.5 mg by mouth daily. Takes twice daily      . aspirin 325 MG tablet Take 81 mg by mouth daily.       [name] atorvastatin (LIPITOR) 80 MG tablet Take 80 mg by mouth daily.        [name] buPROPion (WELLBUTRIN XL) 300 MG 24 hr tablet Take 300 mg by mouth daily.        . Choline Fenofibrate (TRILIPIX) 135 MG capsule Take 1 capsule (135 mg total) by mouth daily.  30 capsule  5  . clopidogrel (PLAVIX) 75 MG tablet Take 1 tablet (75 mg total) by mouth daily.  90 tablet  5  . COMBIVENT RESPIMAT 20-100 MCG/ACT AERS respimat       . esomeprazole (NEXIUM) 40 MG capsule Take 1 capsule (40 mg total) by mouth daily before breakfast.  30 capsule  5  . furosemide (LASIX) 20 MG tablet Take 20 mg by mouth 2 (two) times daily.      [name] lisinopril (PRINIVIL,ZESTRIL) 10 MG tablet Take 1 tablet (10 mg total) by mouth daily.  90 tablet  5  . metoprolol (TOPROL-XL) 50 MG 24 hr tablet Take 50 mg by mouth daily.        . nitroGLYCERIN (NITROSTAT) 0.4 MG SL tablet Place 0.4 mg under the tongue every 5 (five) minutes as needed.        . pregabalin (LYRICA) 75 MG capsule Take 75 mg by mouth 2 (two) times daily.      . sertraline (ZOLOFT) 100 MG  tablet       . VESICARE 5 MG tablet Take 5 mg by mouth daily.       . fish oil-omega-3 fatty acids 1000 MG capsule Take 4 g by mouth daily.        . hydrochlorothiazide (HYDRODIURIL) 12.5 MG tablet Take one tablet daily  30 tablet  5  . HYDROcodone-acetaminophen (NORCO) 10-325 MG per tablet Take 1 tablet by mouth every 6 (six) hours as needed.        No Known Allergies  Past Medical History  Diagnosis Date  . Coronary artery disease   . Non Q wave myocardial infarction 01/2010    RELATED TO THROMBOSIS OF THE DISTAL RIGHT CORONARY   . PAD (peripheral artery disease)   . Anxiety   . Depression   . Hyperlipidemia   . Tobacco abuse   . Overweight   . OP (osteoporosis)     Past Surgical History  Procedure Date  . Cardiac catheterization 12/03/2008    MINIMAL HYPOKINESIA OF THE DISTAL WALL. NORMAL LV FUNCTION  . Iliac artery stent  RIGHT ILIAC STENT  . Knee arthroscopy   . Lumbar laminectomy   . Tubal ligation   . Cardiovascular stress test 02/02/2010    EF 65%    History  Smoking status  . Current Everyday Smoker  . Last Attempt to Quit: 02/22/2010  Smokeless tobacco  . Not on file    History  Alcohol Use No    Family History  Problem Relation Age of Onset  . Alzheimer's disease Mother     Review of Systems: The review of systems is positive for low back pain. Sometimes her feet turn a little bit purple. This is non-positional..  All other systems were reviewed and are negative.  Physical Exam: BP 117/71  Pulse 61  Ht 5\' 5"  (1.651 m)  Wt 192 lb (87.091 kg)  BMI 31.95 kg/m2 Sonya Small is a pleasant white female in no acute distress. Sonya Small is normocephalic, atraumatic. Pupils are equal round and reactive to light accommodation. Extraocular movements are full. Oropharynx is clear. Neck is supple without JVD, adenopathy, thyromegaly, or bruits. Lungs are clear. Cardiac exam reveals a regular rate and rhythm without gallop, murmur, or click. Abdomen is soft and nontender.  Sonya Small has no masses or bruits. Extremities are without edema. Her feet are warm and dry. There is no discoloration. Sonya Small has good capillary refill. Dorsalis pedis  pulses are easily palpable bilaterally. Neurologic exam is nonfocal. LABORATORY DATA: ECG demonstrates normal sinus rhythm with old septal infarct. No acute changes.  Assessment / Plan: 1. Coronary disease with history of non-ST elevation myocardial infarction in 2011 related to thrombosis of the distal right coronary. Clinically Sonya Small is stable medical therapy. Continue aspirin, Plavix, statin therapy, and metoprolol.  2. Peripheral arterial disease. Followed by Dr. Arbie Cookey. Stable claudication symptoms.  3. Hyperlipidemia. we will obtain a copy of her most recent lab work from Dr. Sedalia Muta.  4. Tobacco abuse. Patient has resumed smoking. We discussed the importance of smoking cessation. Sonya Small's not ready to quit again at this time.

## 2012-09-26 ENCOUNTER — Other Ambulatory Visit: Payer: Self-pay

## 2012-09-26 MED ORDER — NITROGLYCERIN 0.4 MG SL SUBL: 0.4000 mg | SUBLINGUAL_TABLET | SUBLINGUAL | Status: DC | PRN

## 2012-10-16 ENCOUNTER — Observation Stay (HOSPITAL_COMMUNITY): Payer: Medicare Other

## 2012-10-16 ENCOUNTER — Inpatient Hospital Stay (HOSPITAL_COMMUNITY)
Admission: EM | Admit: 2012-10-16 | Discharge: 2012-10-18 | DRG: 313 | Disposition: A | Discharge status: Home / Self Care | Payer: Medicare Other | Source: Other Acute Inpatient Hospital | Attending: Internal Medicine | Admitting: Internal Medicine

## 2012-10-16 ENCOUNTER — Encounter (HOSPITAL_COMMUNITY): Payer: Self-pay | Admitting: *Deleted

## 2012-10-16 DIAGNOSIS — I252 Old myocardial infarction: Secondary | ICD-10-CM

## 2012-10-16 DIAGNOSIS — I359 Nonrheumatic aortic valve disorder, unspecified: Secondary | ICD-10-CM

## 2012-10-16 DIAGNOSIS — R0789 Other chest pain: Principal | ICD-10-CM | POA: Diagnosis present

## 2012-10-16 DIAGNOSIS — Z7902 Long term (current) use of antithrombotics/antiplatelets: Secondary | ICD-10-CM

## 2012-10-16 DIAGNOSIS — E785 Hyperlipidemia, unspecified: Secondary | ICD-10-CM | POA: Diagnosis present

## 2012-10-16 DIAGNOSIS — Z720 Tobacco use: Secondary | ICD-10-CM

## 2012-10-16 DIAGNOSIS — I214 Non-ST elevation (NSTEMI) myocardial infarction: Secondary | ICD-10-CM

## 2012-10-16 DIAGNOSIS — Z7982 Long term (current) use of aspirin: Secondary | ICD-10-CM

## 2012-10-16 DIAGNOSIS — F17200 Nicotine dependence, unspecified, uncomplicated: Secondary | ICD-10-CM | POA: Diagnosis present

## 2012-10-16 DIAGNOSIS — J449 Chronic obstructive pulmonary disease, unspecified: Secondary | ICD-10-CM | POA: Diagnosis present

## 2012-10-16 DIAGNOSIS — I10 Essential (primary) hypertension: Secondary | ICD-10-CM | POA: Diagnosis present

## 2012-10-16 DIAGNOSIS — F411 Generalized anxiety disorder: Secondary | ICD-10-CM | POA: Diagnosis present

## 2012-10-16 DIAGNOSIS — I2510 Atherosclerotic heart disease of native coronary artery without angina pectoris: Secondary | ICD-10-CM | POA: Diagnosis present

## 2012-10-16 DIAGNOSIS — E782 Mixed hyperlipidemia: Secondary | ICD-10-CM | POA: Diagnosis present

## 2012-10-16 DIAGNOSIS — Z9861 Coronary angioplasty status: Secondary | ICD-10-CM

## 2012-10-16 DIAGNOSIS — F329 Major depressive disorder, single episode, unspecified: Secondary | ICD-10-CM | POA: Diagnosis present

## 2012-10-16 DIAGNOSIS — J4489 Other specified chronic obstructive pulmonary disease: Secondary | ICD-10-CM | POA: Diagnosis present

## 2012-10-16 DIAGNOSIS — Z79899 Other long term (current) drug therapy: Secondary | ICD-10-CM

## 2012-10-16 DIAGNOSIS — R079 Chest pain, unspecified: Secondary | ICD-10-CM

## 2012-10-16 DIAGNOSIS — M810 Age-related osteoporosis without current pathological fracture: Secondary | ICD-10-CM | POA: Diagnosis present

## 2012-10-16 DIAGNOSIS — F3289 Other specified depressive episodes: Secondary | ICD-10-CM | POA: Diagnosis present

## 2012-10-16 DIAGNOSIS — I739 Peripheral vascular disease, unspecified: Secondary | ICD-10-CM | POA: Diagnosis present

## 2012-10-16 HISTORY — DX: Unspecified thoracic, thoracolumbar and lumbosacral intervertebral disc disorder: M51.9

## 2012-10-16 HISTORY — DX: Atherosclerotic heart disease of native coronary artery without angina pectoris: I25.10

## 2012-10-16 LAB — COMPREHENSIVE METABOLIC PANEL
Albumin: 3.2 g/dL — ABNORMAL LOW (ref 3.5–5.2)
Alkaline Phosphatase: 76 U/L (ref 39–117)
BUN: 20 mg/dL (ref 6–23)
CO2: 25 mEq/L (ref 19–32)
Chloride: 104 mEq/L (ref 96–112)
Creatinine, Ser: 0.7 mg/dL (ref 0.50–1.10)
GFR calc non Af Amer: >90 mL/min (ref >90)
Potassium: 3.9 mEq/L (ref 3.5–5.1)
Total Bilirubin: 0.2 mg/dL — ABNORMAL LOW (ref 0.3–1.2)

## 2012-10-16 LAB — LIPID PANEL: Cholesterol: 172 mg/dL (ref 0–200)

## 2012-10-16 LAB — CBC WITH DIFFERENTIAL/PLATELET
Basophils Absolute: 0 10*3/uL (ref 0.0–0.1)
Eosinophils Absolute: 0.2 10*3/uL (ref 0.0–0.7)
Eosinophils Relative: 2 % (ref 0–5)
Lymphs Abs: 3 10*3/uL (ref 0.7–4.0)
MCH: 28.7 pg (ref 26.0–34.0)
MCV: 89 fL (ref 78.0–100.0)
Neutrophils Relative %: 54 % (ref 43–77)
Platelets: 225 10*3/uL (ref 150–400)
RBC: 4.53 MIL/uL (ref 3.87–5.11)
RDW: 13.9 % (ref 11.5–15.5)
WBC: 8.2 10*3/uL (ref 4.0–10.5)

## 2012-10-16 LAB — PRO B NATRIURETIC PEPTIDE: Pro B Natriuretic peptide (BNP): 51.4 pg/mL (ref 0–125)

## 2012-10-16 LAB — TSH: TSH: 1.484 u[IU]/mL (ref 0.350–4.500)

## 2012-10-16 MED ORDER — OXYCODONE-ACETAMINOPHEN 10-325 MG PO TABS: 1.0000 | ORAL_TABLET | Freq: Four times a day (QID) | ORAL | Status: DC | PRN

## 2012-10-16 MED ORDER — OXYCODONE-ACETAMINOPHEN 5-325 MG PO TABS
1.0000 | ORAL_TABLET | Freq: Four times a day (QID) | ORAL | Status: DC | PRN
  Administered 2012-10-16 – 2012-10-18 (×3): 1 via ORAL
  Filled 2012-10-16 (×3): qty 1

## 2012-10-16 MED ORDER — FUROSEMIDE 20 MG PO TABS
20.0000 mg | ORAL_TABLET | Freq: Two times a day (BID) | ORAL | Status: DC
  Administered 2012-10-16 – 2012-10-18 (×5): 20 mg via ORAL
  Filled 2012-10-16 (×8): qty 1

## 2012-10-16 MED ORDER — ACETAMINOPHEN 650 MG RE SUPP: 650.0000 mg | Freq: Four times a day (QID) | RECTAL | Status: DC | PRN

## 2012-10-16 MED ORDER — PANTOPRAZOLE SODIUM 40 MG PO TBEC
40.0000 mg | DELAYED_RELEASE_TABLET | Freq: Every day | ORAL | Status: DC
  Administered 2012-10-16: 40 mg via ORAL
  Filled 2012-10-16: qty 1

## 2012-10-16 MED ORDER — SODIUM CHLORIDE 0.9 % IJ SOLN
3.0000 mL | Freq: Two times a day (BID) | INTRAMUSCULAR | Status: DC
  Administered 2012-10-16 – 2012-10-18 (×4): 3 mL via INTRAVENOUS

## 2012-10-16 MED ORDER — SODIUM CHLORIDE 0.9 % IJ SOLN
3.0000 mL | Freq: Two times a day (BID) | INTRAMUSCULAR | Status: DC
  Administered 2012-10-16 – 2012-10-18 (×4): 3 mL via INTRAVENOUS

## 2012-10-16 MED ORDER — IPRATROPIUM-ALBUTEROL 20-100 MCG/ACT IN AERS: 1.0000 | INHALATION_SPRAY | Freq: Four times a day (QID) | RESPIRATORY_TRACT | Status: DC | PRN

## 2012-10-16 MED ORDER — ASPIRIN EC 81 MG PO TBEC
81.0000 mg | DELAYED_RELEASE_TABLET | Freq: Every day | ORAL | Status: DC
  Administered 2012-10-16 – 2012-10-18 (×3): 81 mg via ORAL
  Filled 2012-10-16 (×3): qty 1

## 2012-10-16 MED ORDER — MOMETASONE FURO-FORMOTEROL FUM 200-5 MCG/ACT IN AERO
2.0000 | INHALATION_SPRAY | Freq: Two times a day (BID) | RESPIRATORY_TRACT | Status: DC
  Administered 2012-10-16 – 2012-10-18 (×5): 2 via RESPIRATORY_TRACT
  Filled 2012-10-16 (×2): qty 8.8

## 2012-10-16 MED ORDER — ONDANSETRON HCL 4 MG PO TABS: 4.0000 mg | ORAL_TABLET | Freq: Four times a day (QID) | ORAL | Status: DC | PRN

## 2012-10-16 MED ORDER — ENOXAPARIN SODIUM 40 MG/0.4ML ~~LOC~~ SOLN
40.0000 mg | SUBCUTANEOUS | Status: DC
  Administered 2012-10-16 – 2012-10-17 (×2): 40 mg via SUBCUTANEOUS
  Filled 2012-10-16 (×3): qty 0.4

## 2012-10-16 MED ORDER — PREGABALIN 50 MG PO CAPS
75.0000 mg | ORAL_CAPSULE | Freq: Two times a day (BID) | ORAL | Status: DC
  Administered 2012-10-16 – 2012-10-18 (×5): 75 mg via ORAL
  Filled 2012-10-16 (×5): qty 1

## 2012-10-16 MED ORDER — FENOFIBRATE 160 MG PO TABS
160.0000 mg | ORAL_TABLET | Freq: Every day | ORAL | Status: DC
  Administered 2012-10-16 – 2012-10-18 (×3): 160 mg via ORAL
  Filled 2012-10-16 (×3): qty 1

## 2012-10-16 MED ORDER — METOPROLOL SUCCINATE ER 50 MG PO TB24
50.0000 mg | ORAL_TABLET | Freq: Every day | ORAL | Status: DC
  Administered 2012-10-17 – 2012-10-18 (×2): 50 mg via ORAL
  Filled 2012-10-16 (×3): qty 1

## 2012-10-16 MED ORDER — TECHNETIUM TC 99M SESTAMIBI GENERIC - CARDIOLITE
30.0000 | Freq: Once | INTRAVENOUS | Status: AC | PRN
  Administered 2012-10-16: 30 via INTRAVENOUS

## 2012-10-16 MED ORDER — OMEGA-3-ACID ETHYL ESTERS 1 G PO CAPS
4.0000 g | ORAL_CAPSULE | Freq: Every day | ORAL | Status: DC
  Administered 2012-10-16 – 2012-10-17 (×2): 4 g via ORAL
  Administered 2012-10-18: 1 g via ORAL
  Filled 2012-10-16 (×3): qty 4

## 2012-10-16 MED ORDER — ONDANSETRON HCL 4 MG/2ML IJ SOLN: 4.0000 mg | Freq: Four times a day (QID) | INTRAMUSCULAR | Status: DC | PRN

## 2012-10-16 MED ORDER — ALPRAZOLAM 0.25 MG PO TABS: 0.2500 mg | ORAL_TABLET | Freq: Every evening | ORAL | Status: DC | PRN

## 2012-10-16 MED ORDER — TECHNETIUM TC 99M SESTAMIBI GENERIC - CARDIOLITE
10.0000 | Freq: Once | INTRAVENOUS | Status: AC | PRN
  Administered 2012-10-16: 10 via INTRAVENOUS

## 2012-10-16 MED ORDER — ACETAMINOPHEN 325 MG PO TABS: 650.0000 mg | ORAL_TABLET | Freq: Four times a day (QID) | ORAL | Status: DC | PRN

## 2012-10-16 MED ORDER — ATORVASTATIN CALCIUM 80 MG PO TABS
80.0000 mg | ORAL_TABLET | Freq: Every day | ORAL | Status: DC
  Administered 2012-10-16 – 2012-10-18 (×3): 80 mg via ORAL
  Filled 2012-10-16 (×3): qty 1

## 2012-10-16 MED ORDER — DARIFENACIN HYDROBROMIDE ER 7.5 MG PO TB24
7.5000 mg | ORAL_TABLET | Freq: Every day | ORAL | Status: DC
  Administered 2012-10-17: 7.5 mg via ORAL
  Filled 2012-10-16 (×3): qty 1

## 2012-10-16 MED ORDER — ALPRAZOLAM 0.5 MG PO TABS
0.5000 mg | ORAL_TABLET | Freq: Every day | ORAL | Status: DC
  Administered 2012-10-16 – 2012-10-18 (×3): 0.5 mg via ORAL
  Filled 2012-10-16 (×3): qty 1

## 2012-10-16 MED ORDER — REGADENOSON 0.4 MG/5ML IV SOLN
INTRAVENOUS | Status: AC
  Filled 2012-10-16: qty 5

## 2012-10-16 MED ORDER — HYDROCHLOROTHIAZIDE 12.5 MG PO CAPS
12.5000 mg | ORAL_CAPSULE | Freq: Every day | ORAL | Status: DC
  Filled 2012-10-16: qty 1

## 2012-10-16 MED ORDER — OXYCODONE HCL 5 MG PO TABS
5.0000 mg | ORAL_TABLET | Freq: Four times a day (QID) | ORAL | Status: DC | PRN
  Administered 2012-10-17 – 2012-10-18 (×3): 5 mg via ORAL
  Filled 2012-10-16 (×3): qty 1

## 2012-10-16 MED ORDER — REGADENOSON 0.4 MG/5ML IV SOLN
0.4000 mg | Freq: Once | INTRAVENOUS | Status: AC
  Administered 2012-10-16: 0.4 mg via INTRAVENOUS
  Filled 2012-10-16: qty 5

## 2012-10-16 MED ORDER — CLOPIDOGREL BISULFATE 75 MG PO TABS
75.0000 mg | ORAL_TABLET | Freq: Every day | ORAL | Status: DC
  Administered 2012-10-16 – 2012-10-18 (×3): 75 mg via ORAL
  Filled 2012-10-16 (×4): qty 1

## 2012-10-16 MED ORDER — SERTRALINE HCL 100 MG PO TABS
100.0000 mg | ORAL_TABLET | Freq: Every day | ORAL | Status: DC
  Filled 2012-10-16 (×3): qty 1

## 2012-10-16 MED ORDER — MORPHINE SULFATE 2 MG/ML IJ SOLN
1.0000 mg | INTRAMUSCULAR | Status: DC | PRN
  Administered 2012-10-16: 1 mg via INTRAVENOUS
  Filled 2012-10-16: qty 1

## 2012-10-16 MED ORDER — BUPROPION HCL ER (XL) 300 MG PO TB24
300.0000 mg | ORAL_TABLET | Freq: Every day | ORAL | Status: DC
  Administered 2012-10-16 – 2012-10-18 (×3): 300 mg via ORAL
  Filled 2012-10-16 (×3): qty 1

## 2012-10-16 MED ORDER — LISINOPRIL 5 MG PO TABS
5.0000 mg | ORAL_TABLET | Freq: Every day | ORAL | Status: DC
  Administered 2012-10-16 – 2012-10-18 (×3): 5 mg via ORAL
  Filled 2012-10-16 (×3): qty 1

## 2012-10-16 MED ORDER — LISINOPRIL 10 MG PO TABS
10.0000 mg | ORAL_TABLET | Freq: Every day | ORAL | Status: DC
  Filled 2012-10-16: qty 1

## 2012-10-16 NOTE — Progress Notes (Signed)
Lexiscan Myoview performed. 

## 2012-10-16 NOTE — Progress Notes (Signed)
  Echocardiogram 2D Echocardiogram has been performed.  Cathie Beams 10/16/2012, 3:17 PM

## 2012-10-16 NOTE — H&P (Signed)
Sonya Small is an 62 y.o. female. Patient was seen and examined on October 16, 2012. PCP - in [place]. Cardiologist - Dr. [name] [name].  Chief Complaint: Chest pain. HPI: 62 year-old female with history of CAD, peripheral arterial disease, COPD and ongoing tobacco abuse presented to the ER at [facility] with complaints of chest pain. Last evening while patient was in the church at around 7:30 PM start helping chest pain which was pressure across the chest radiating to her left arm. The pressure lasted for around half an hour and had gone to the ER where nitroglycerin paste relieve the chest pain. Over there in the ER cardiac enzymes were negative and patient became chest pain-free and was planned to be admitted for further observation and at that point patient wanted to be transferred to [facility] has a cardiologist was here. Patient after reaching at [facility] started having another episode of chest pain which got relieved after morphine and sublingual nitroglycerin. Presently patient is chest pain-free. Repeat EKG done here does not show anything acute. Patient has been admitted for further management. Labs done at [facility] show normal chest x-ray negative cardiac enzyme negative d-dimer, sodium 142, potassium 3.9 bicarbonate 25 creatinine one post 127 calcium 9.3 WBC 10 hemoglobin 14 platelets 268.  Past Medical History  Diagnosis Date  . Coronary artery disease   . Non Q wave myocardial infarction 01/2010    RELATED TO THROMBOSIS OF THE DISTAL RIGHT CORONARY   . PAD (peripheral artery disease)   . Anxiety   . Depression   . Hyperlipidemia   . Tobacco abuse   . Overweight   . OP (osteoporosis)     Past Surgical History  Procedure Date  . Cardiac catheterization 12/03/2008    MINIMAL HYPOKINESIA OF THE DISTAL WALL. NORMAL LV FUNCTION  . Iliac artery stent     RIGHT ILIAC STENT  . Knee arthroscopy   . Lumbar laminectomy   . Tubal ligation   . Cardiovascular stress test  02/02/2010    EF 65%    Family History  Problem Relation Age of Onset  . Alzheimer's disease Mother    Social History:  reports that she has been smoking.  She does not have any smokeless tobacco history on file. She reports that she does not drink alcohol or use illicit drugs.  Allergies: No Known Allergies  Medications Prior to Admission  Medication Sig Dispense Refill  . ALPRAZolam (XANAX) 0.25 MG tablet Take 1 tablet (0.25 mg total) by mouth at bedtime as needed for sleep.  30 tablet  5  . atorvastatin (LIPITOR) 80 MG tablet Take 80 mg by mouth daily.        . clopidogrel (PLAVIX) 75 MG tablet Take 1 tablet (75 mg total) by mouth daily.  90 tablet  5  . HYDROcodone-acetaminophen (NORCO) 10-325 MG per tablet Take 1 tablet by mouth every 6 (six) hours as needed.      [name] lisinopril (PRINIVIL,ZESTRIL) 10 MG tablet Take 1 tablet (10 mg total) by mouth daily.  90 tablet  5  . metoprolol (TOPROL-XL) 50 MG 24 hr tablet Take 50 mg by mouth daily.        . pregabalin (LYRICA) 75 MG capsule Take 75 mg by mouth 2 (two) times daily.      . VESICARE 5 MG tablet Take 5 mg by mouth daily.       [name] ADVAIR DISKUS 500-50 MCG/DOSE AEPB       . ALPRAZolam (  XANAX) 0.25 MG tablet Take 0.5 mg by mouth daily. Takes twice daily      . aspirin 325 MG tablet Take 81 mg by mouth daily.       [name] buPROPion (WELLBUTRIN XL) 300 MG 24 hr tablet Take 300 mg by mouth daily.        . Choline Fenofibrate (TRILIPIX) 135 MG capsule Take 1 capsule (135 mg total) by mouth daily.  30 capsule  5  . COMBIVENT RESPIMAT 20-100 MCG/ACT AERS respimat       . esomeprazole (NEXIUM) 40 MG capsule Take 1 capsule (40 mg total) by mouth daily before breakfast.  30 capsule  5  . fish oil-omega-3 fatty acids 1000 MG capsule Take 4 g by mouth daily.        . furosemide (LASIX) 20 MG tablet Take 20 mg by mouth 2 (two) times daily.      . hydrochlorothiazide (HYDRODIURIL) 12.5 MG tablet Take one tablet daily  30 tablet  5  . methocarbamol  (ROBAXIN) 500 MG tablet       . nitroGLYCERIN (NITROSTAT) 0.4 MG SL tablet Place 1 tablet (0.4 mg total) under the tongue every 5 (five) minutes as needed.  25 tablet  2  . oxyCODONE-acetaminophen (PERCOCET) 10-325 MG per tablet       . sertraline (ZOLOFT) 100 MG tablet         No results found for this or any previous visit (from the past 48 hour(s)). No results found.  Review of Systems  Constitutional: Negative.   HENT: Negative.   Eyes: Negative.   Respiratory: Positive for cough and shortness of breath.   Cardiovascular: Positive for chest pain.  Gastrointestinal: Negative.   Genitourinary: Negative.   Musculoskeletal: Negative.   Skin: Negative.   Neurological: Negative.   Endo/Heme/Allergies: Negative.   Psychiatric/Behavioral: Negative.     Blood pressure 102/68, pulse 63, temperature 98 F (36.7 C), temperature source Oral, resp. rate 20, height 5\' 5"  (1.651 m), weight 87.363 kg (192 lb 9.6 oz), SpO2 95.00%. Physical Exam  Constitutional: She is oriented to person, place, and time. She appears well-developed and well-nourished. No distress.  HENT:  Head: Normocephalic and atraumatic.  Right Ear: External ear normal.  Left Ear: External ear normal.  Nose: Nose normal.  Mouth/Throat: Oropharynx is clear and moist. No oropharyngeal exudate.  Eyes: Conjunctivae normal are normal. Pupils are equal, round, and reactive to light. Right eye exhibits no discharge. Left eye exhibits no discharge. No scleral icterus.  Neck: Normal range of motion. Neck supple.  Cardiovascular: Normal rate and regular rhythm.   Respiratory: Effort normal and breath sounds normal. No respiratory distress. She has no wheezes. She has no rales.  GI: Soft. Bowel sounds are normal. She exhibits no distension. There is no tenderness. There is no rebound.  Musculoskeletal: She exhibits no edema and no tenderness.  Neurological: She is alert and oriented to person, place, and time.  Skin: Skin is warm  and dry. She is not diaphoretic.     Assessment/Plan #1. Chest pain with history of CAD - cycle cardiac markers. Continue patient's antiplatelet agents. For now patient will be kept n.p.o. except medications until cardiologist see. Further recommendations per cardiology. #2. COPD - presently not wheezing. Continue inhalers. #3. Tobacco abuse - patient advised to quit smoking. #4. Peripheral arterial disease - presently no acute issues. #5. Hyperlipidemia - continue present medications.  Patient's repeat metabolic panel cardiac enzymes CBC are pending.  CODE STATUS - full code.  Eduard Clos 10/16/2012, 6:22 AM

## 2012-10-16 NOTE — Plan of Care (Signed)
Problem: Phase I Progression Outcomes Goal: Anginal pain relieved Outcome: Not Met (add Reason) Pts chest pain is 5/10

## 2012-10-16 NOTE — Progress Notes (Signed)
Patient articulated to the nurse that she was experiencing chest pain 5/10. The nurse contacted NP Craige Cotta who wrote an order for 1mg  of Morphine IV. The nurse administered morphine to the patient. Harmon Pier

## 2012-10-16 NOTE — Consult Note (Signed)
CARDIOLOGY CONSULT NOTE  Patient ID: Sonya Small, MRN: [id number], DOB/AGE: [birth year] 62 y.o. Admit date: 10/16/2012   Date of Consult: 10/16/2012 Primary Physician: Blane Ohara, MD Primary Cardiologist: [name]  Chief Complaint: chest pain Reason for Consult: chest pain in a pt with known history of CAD  HPI: [name] is a 62 y/o F with history of CAD (NSTEMI 2010 s/p LCX DES, NSTEMI 2011 2/2 thrombotic RCA lesion tx medically), PAD s/p LE stent, ongoing tobacco abuse who presented initially to [facility] with complaints of chest pain.  Last night while standing at the [employer], she developed substernal chest pain that radiated into her neck/jaw with associated diaphoresis, and paleness. This pain reminded her of her prior MI in 2011. She took 2 SL NTG with some relief,  then her family drove her to the ER. She received full dose lovenox, metoprolol 12.5mg  x 1, morphine, NTG paste, and Zofran. She still had low grade pain this AM - she received IV morphine for her back pain, which completely relieved her chest discomfort as well. EKG is unchanged from 04/2012 with nonspecific ST-T changes. Troponins neg x 3 since 10pm last night. LFTs mildly elevated on admission, improved today. CXR no active disease. She has also noticed worsening of her chronic DOE over the last several weeks. A few days ago while carrying in groceries, she felt SOB, chest pounding, and diaphoresis. This resolved with rest. She thinks she may have had heart palpitations with each of these episodes but isn't sure if it was just increase in heart pounding. She also reports 14lb wt gain in 2 months. + Mild orthopnea, occasional tr LEE after being on feet al day but not now.   She also reports an occasional "terrible pain" in her RUQ typically occurring after meals that lasts for an hour. This is different than the presenting CP. She has also been taking 1 packet of BC powder 4x/day for back pain. She reports compliance with  other meds. .  Past Medical History  Diagnosis Date  . Coronary artery disease   . CAD (coronary artery disease) 01/2010    a. NSTEMI 11/2008 s/p DES to LCx. b. NSTEMI 01/2010 secondary to thrombotic RCA lesion, med rx.  [name] PAD (peripheral artery disease)     a. Emboli to R foot 2010 from partially occlusive lesion in R EIA, s/p stenting.  . Anxiety   . Depression   . Hyperlipidemia   . Tobacco abuse   . Overweight   . OP (osteoporosis)   . HTN (hypertension)   . Lumbar disc disease       Most Recent Cardiac Studies: Cardiac Cath 01/2010 HEMODYNAMIC DATA:  Aortic pressure was 139/73 with a mean of 100 mmHg. Left ventricle pressure was 136 with an EDP of 17 mmHg. ANGIOGRAPHIC DATA:  The left coronary artery arises and distributes normally.  The left main coronary artery appears normal. The left anterior descending artery is a large vessel, which extends out to the apex.  There are minor irregularities less than 10% in this vessel.  The first diagonal is small and without significant disease. The second diagonal branch has a focal 50-70% stenosis in the midvessel. Left circumflex coronary gives rise to two marginal branches.  The first marginal branch has a 20-30% narrowing in the proximal vessel.  The midcircumflex site of previous stent is widely patent with excellent runoff down the second marginal branch. The right coronary is a large dominant vessel.  Has 10-20% narrowing  in the proximal to mid vessel.  In the distal vessel, there are oscillating filling defects consistent with thrombus that her nonobstructive.  There is no clear evidence of dissection or intimal disruption. Left ventricular angiography performed in the RAO view demonstrates normal left ventricular size with focal apical akinesia and distal inferior hypokinesia with overall ejection fraction of 55%. IMPRESSION: 1. Nonobstructive atherosclerotic coronary artery disease.  There is     continued excellent  long-term patency of the stent in the     midcircumflex. 2. Filling defects noted in the distal right coronary artery     consistent with thrombus.  There does not appear to be any     obstruction. 3. Good left ventricular systolic function with inferior apical wall     motion abnormality. PLAN:  It appears that her culprit lesion is thrombus in the distal right coronary artery .  Since this is nonobstructive at this time, we will treat with aggressive antiplatelet and antithrombin therapy.  Watch closely for recurrent symptoms.  2D Echo Study Conclusions 04/2011  - Left ventricle: The cavity size was normal. Wall thickness was normal. Systolic function was normal. The estimated ejection fraction was in the range of 55% to 60%. Doppler parameters are consistent with abnormal left ventricular relaxation (grade 1 diastolic dysfunction). - Aortic valve: Mild regurgitation. - Mitral valve: Mild regurgitation.   Surgical History:  Past Surgical History  Procedure Date  . Iliac artery stent     RIGHT ILIAC STENT  . Knee arthroscopy   . Lumbar laminectomy   . Tubal ligation   . Hysterectomy -age 42      Home Meds: Prior to Admission medications   Medication Sig Start Date End Date Taking? Authorizing Provider  ALPRAZolam (XANAX) 0.25 MG tablet Take 1 tablet (0.25 mg total) by mouth at bedtime as needed for sleep. 03/08/11  Yes [name] [name], MD  atorvastatin (LIPITOR) 80 MG tablet Take 80 mg by mouth daily.     Yes Historical Provider, MD  clopidogrel (PLAVIX) 75 MG tablet Take 1 tablet (75 mg total) by mouth daily. 10/30/11  Yes [name] [name], MD  HYDROcodone-acetaminophen [facility]) 10-325 MG per tablet Take 1 tablet by mouth every 6 (six) hours as needed.   Yes Historical Provider, MD  lisinopril (PRINIVIL,ZESTRIL) 10 MG tablet Take 1 tablet (10 mg total) by mouth daily. 10/30/11  Yes [name] [name], MD  metoprolol (TOPROL-XL) 50 MG 24 hr tablet Take 50 mg by mouth daily.     Yes Historical  Provider, MD  pregabalin (LYRICA) 75 MG capsule Take 75 mg by mouth 2 (two) times daily.   Yes Historical Provider, MD  VESICARE 5 MG tablet Take 5 mg by mouth daily.  04/29/12  Yes Historical Provider, MD  ADVAIR DISKUS 500-50 MCG/DOSE AEPB  04/18/12   Historical Provider, MD  ALPRAZolam Prudy Feeler) 0.25 MG tablet Take 0.5 mg by mouth daily. Takes twice daily    Historical Provider, MD  aspirin 325 MG tablet Take 81 mg by mouth daily.     Historical Provider, MD  buPROPion (WELLBUTRIN XL) 300 MG 24 hr tablet Take 300 mg by mouth daily.      Historical Provider, MD  Choline Fenofibrate (TRILIPIX) 135 MG capsule Take 1 capsule (135 mg total) by mouth daily. 06/08/11   [name] [name], MD  COMBIVENT RESPIMAT 20-100 MCG/ACT AERS respimat  04/18/12   Historical Provider, MD  esomeprazole (NEXIUM) 40 MG capsule Take 1 capsule (40 mg total) by  mouth daily before breakfast. 09/14/11 09/13/12  [name] [name], MD  fish oil-omega-3 fatty acids 1000 MG capsule Take 4 g by mouth daily.      Historical Provider, MD  furosemide (LASIX) 20 MG tablet Take 20 mg by mouth 2 (two) times daily.    Historical Provider, MD  hydrochlorothiazide (HYDRODIURIL) 12.5 MG tablet Take one tablet daily 05/07/11   [name] [name], MD  methocarbamol (ROBAXIN) 500 MG tablet  04/29/12   Historical Provider, MD  nitroGLYCERIN (NITROSTAT) 0.4 MG SL tablet Place 1 tablet (0.4 mg total) under the tongue every 5 (five) minutes as needed. 09/26/12   [name] [name], MD  oxyCODONE-acetaminophen [facility]) 10-325 MG per tablet  05/16/12   Historical Provider, MD  sertraline (ZOLOFT) 100 MG tablet  04/18/12   Historical Provider, MD    Inpatient Medications:     . ALPRAZolam  0.5 mg Oral Daily  . aspirin EC  81 mg Oral Daily  . atorvastatin  80 mg Oral Daily  . buPROPion  300 mg Oral Daily  . clopidogrel  75 mg Oral Q breakfast  . darifenacin  7.5 mg Oral Daily  . enoxaparin (LOVENOX) injection  40 mg Subcutaneous Q24H  . fenofibrate  160 mg Oral  Daily  . furosemide  20 mg Oral BID  . hydrochlorothiazide  12.5 mg Oral Daily  . lisinopril  10 mg Oral Daily  . metoprolol succinate  50 mg Oral Daily  . mometasone-formoterol  2 puff Inhalation BID  . omega-3 acid ethyl esters  4 g Oral Daily  . pantoprazole  40 mg Oral Daily  . pregabalin  75 mg Oral BID  . sertraline  100 mg Oral Daily  . sodium chloride  3 mL Intravenous Q12H  . sodium chloride  3 mL Intravenous Q12H    Allergies: No Known Allergies  History   Social History  . Marital Status: Legally Separated    Spouse Name: N/A    Number of Children: 3  . Years of Education: N/A   Occupational History  . DISABLED    Social History Main Topics  . Smoking status: Current Every Day Smoker    Last Attempt to Quit: 02/22/2010  . Smokeless tobacco: Not on file  . Alcohol Use: No  . Drug Use: No  . Sexually Active:    Other Topics Concern  . Not on file   Social History Narrative  . No narrative on file     Family History  Problem Relation Age of Onset  . Alzheimer's disease Mother      Review of Systems: General: negative for chills, fever, night sweats. Wt gain as above Cardiovascular: see above Dermatological: negative for rash Respiratory: chronic unchanged cough, occasionally productive of sputum (white or brown, worse in AM, no hemoptysis) Urologic: negative for hematuria Abdominal: negative for nausea, vomiting, diarrhea, bright red blood per rectum, melena, or hematemesis. RUQ pain as above post-prandial Neurologic: negative for visual changes, syncope. Does occasionally feel dizzy when she exerts herself or stands up, occurs at random times. She reports short-term memory loss over the last year or so, no acute neurologic deficits/visual loss/weakness/delirium/hallucinations All other systems reviewed and are otherwise negative except as noted above.  Labs:  Labs at [place]: WBC 10, Hgb 14, Hct 42, Plt 268, INR 1, D-dimer 138 (neg is <400), Na  143, K 3.9, Cl 105, CO@ 25, BUN 22, Cr 1.0, glu 127 Mildly elevated LFTs with AST 44 (ref range 14-36), ALT 60 (  ref 9-52), Tbili 0.1, albumin 4.0 CE's: neg x 2 there (1/22 at 20:25, 23:20)   Basename 10/16/12 0640  CKTOTAL --  CKMB --  TROPONINI <0.30   Lab Results  Component Value Date   WBC 8.2 10/16/2012   HGB 13.0 10/16/2012   HCT 40.3 10/16/2012   MCV 89.0 10/16/2012   PLT 225 10/16/2012     Radiology/Studies:  CXR 1/22: No acute disease. Lungs clear. Heart size normal. No ptx or pleural fluid.  EKG: no significant change from 04/2012 [facility] EKG 1/22 at 20:17: NSR 78bpm, nonspecific ST-T changes including T wave inversion in avL, somewhat decreased voltage, Q's V1-V2 This AM's EKG: sinus bradycardia 59bpm, nonspecific ST-T changes including T wave inversion in avL, possible anterior infarct age undetermined (Qs V1-V3), decreased voltage.  Physical Exam: Blood pressure 102/68, pulse 63, temperature 98 F (36.7 C), temperature source Oral, resp. rate 20, height 5\' 5"  (1.651 m), weight 192 lb 9.6 oz (87.363 kg), SpO2 95.00%. General: Well developed, well nourished WF in no acute distress. Head: Normocephalic, atraumatic, sclera non-icteric, no xanthomas, nares are without discharge.  Neck: Negative for carotid bruits. JVD not elevated. Lungs: Clear bilaterally to auscultation without wheezes, rales, or rhonchi. Breathing is unlabored. Bronchitic sounding cough which pt states is chronic. Heart: RRR with S1 S2. No murmurs, rubs, or gallops appreciated. Abdomen: Soft, non-tender, non-distended with normoactive bowel sounds. No hepatomegaly. No rebound/guarding. No obvious abdominal masses. Neg Murphy's sign. Msk:  Strength and tone appear normal for age. Extremities: No clubbing or cyanosis. No edema.  Distal pedal pulses are 2+ and equal bilaterally. Neuro: Alert and oriented X 3. No facial asymmetry. No focal deficit. Moves all extremities spontaneously. Psych:  Responds to  questions appropriately with a normal affect.   Assessment and Plan:   1. Chest pain, concerning for unstable angina with known history of CAD - No objective evidence of ischemia thus far but does need further testing given her history. Will discuss further eval with MD. Continue ASA, statin, BB. She also reports some symptoms that may represent mild CHF but exam is benign. Check 2D echo. 2. Post-prandial RUQ discomfort - obtain abd [place]. 3. Mild transaminitis - improved this AM. Would recommend to decrease frequency of BC powder at discharge to avoid Tylenol toxicity as she is also taking Norco at home. Should be followed as OP given high dose statin use. 4. Ongoing tobacco use with suspicion for COPD - counseled regarding cessation. Probably needs OP eval with pulmonology. 5. HTN - she does report some dizziness on standing. Check orthostatics. Discontinue HCTZ and cut lisinopril in half given borderline BP this AM. 6. PAD, no recent claudication symptoms - stable. 7. Hyperlipidemia - LDL well controlled. See above.  Signed, Ronie Spies PA-C 10/16/2012, 9:18 AM  Patient seen with PA, agree with the above note.  Patient had an episode of chest pain lasting about 45 minutes that gradually resolved.  Cardiac enzymes have been negative and ECG is nonacute.  She has a history of CAD and is an active smoker.  She has chronic exertional dyspnea.  She also gets RUQ pain after eating that can be quite severe.  This is different than the chest pain.  - Lexiscan Sestamibi today - Echo  - RUQ [place] to assess for gallstones  Marca Ancona 10/16/2012 9:47 AM

## 2012-10-16 NOTE — Progress Notes (Signed)
Nurse inquired into whether the morphine elevated the 5/10 chest pain. Patient informed the nurse that the chest pain was elevated. Harmon Pier

## 2012-10-17 ENCOUNTER — Encounter (HOSPITAL_COMMUNITY): Payer: Self-pay | Admitting: Radiology

## 2012-10-17 ENCOUNTER — Inpatient Hospital Stay (HOSPITAL_COMMUNITY): Payer: Medicare Other

## 2012-10-17 DIAGNOSIS — R079 Chest pain, unspecified: Secondary | ICD-10-CM

## 2012-10-17 LAB — BASIC METABOLIC PANEL
CO2: 27 mEq/L (ref 19–32)
Chloride: 106 mEq/L (ref 96–112)
Glucose, Bld: 102 mg/dL — ABNORMAL HIGH (ref 70–99)
Potassium: 4 mEq/L (ref 3.5–5.1)
Sodium: 143 mEq/L (ref 135–145)

## 2012-10-17 LAB — CBC
HCT: 42.3 % (ref 36.0–46.0)
Hemoglobin: 13.1 g/dL (ref 12.0–15.0)
MCH: 27.7 pg (ref 26.0–34.0)
MCV: 89.4 fL (ref 78.0–100.0)
Platelets: 211 10*3/uL (ref 150–400)
RBC: 4.73 MIL/uL (ref 3.87–5.11)
WBC: 6.7 10*3/uL (ref 4.0–10.5)

## 2012-10-17 MED ORDER — PANTOPRAZOLE SODIUM 40 MG PO TBEC
40.0000 mg | DELAYED_RELEASE_TABLET | Freq: Two times a day (BID) | ORAL | Status: DC
  Administered 2012-10-17 – 2012-10-18 (×3): 40 mg via ORAL
  Filled 2012-10-17 (×2): qty 1

## 2012-10-17 MED ORDER — IOHEXOL 300 MG/ML  SOLN
100.0000 mL | Freq: Once | INTRAMUSCULAR | Status: AC | PRN
  Administered 2012-10-17: 100 mL via INTRAVENOUS

## 2012-10-17 NOTE — Progress Notes (Signed)
[facility] PROGRESS NOTE Assessment/Plan: Chest pain/epigastric - Myoview negative no further cardiac w/u. - Ct abdomen and pelvis rule out stone,  LFT's with normal limits. Pain worst with eating. - no rashes, tender in the costal area reproducible by palpation, check a CXR rule out fracture.  PAD (peripheral artery disease) () - cont ASA  Hyperlipidemia () -statins    Code Status: full Family Communication: sister  Disposition Plan: home 1-2 days   Consultants:  cards  Procedures:  [place] 1.23.2014: mild dilation of bile duct  myoview 1.24.2014 no ischemia  Antibiotics:  None  HPI/Subjective: Still having pain  Objective: Filed Vitals:   10/16/12 1435 10/16/12 1900 10/16/12 1947 10/17/12 0408  BP: 104/65  117/65 150/80  Pulse:  67 77 73  Temp:   98.2 F (36.8 C) 97.3 F (36.3 C)  TempSrc:   Oral Oral  Resp:  18 18 18   Height:      Weight:      SpO2: 100% 96% 95% 100%    Intake/Output Summary (Last 24 hours) at 10/17/12 1004 Last data filed at 10/16/12 2143  Gross per 24 hour  Intake    603 ml  Output      0 ml  Net    603 ml   Filed Weights   10/16/12 0441  Weight: 87.363 kg (192 lb 9.6 oz)    Exam:  General: Alert, awake, oriented x3, in no acute distress.  HEENT: No bruits, no goiter.  Heart: Regular rate and rhythm, without murmurs, rubs, gallops.  Lungs: Good air movement, bilateral air movement.  Abdomen: render in rib upper quadrant around the rib cage murphy negative.   Data Reviewed: Basic Metabolic Panel:  Lab 10/17/12 1478 10/16/12 0640  NA 143 141  K 4.0 3.9  CL 106 104  CO2 27 25  GLUCOSE 102* 93  BUN 15 20  CREATININE 0.63 0.70  CALCIUM 9.2 8.9  MG -- --  PHOS -- --   Liver Function Tests:  Lab 10/16/12 0640  AST 27  ALT 35  ALKPHOS 76  BILITOT 0.2*  PROT 6.2  ALBUMIN 3.2*   No results found for this basename: LIPASE:5,AMYLASE:5 in the last 168 hours No results found for this basename: AMMONIA:5 in  the last 168 hours CBC:  Lab 10/17/12 0515 10/16/12 0640  WBC 6.7 8.2  NEUTROABS -- 4.4  HGB 13.1 13.0  HCT 42.3 40.3  MCV 89.4 89.0  PLT 211 225   Cardiac Enzymes:  Lab 10/16/12 1757 10/16/12 1408 10/16/12 0640  CKTOTAL -- -- --  CKMB -- -- --  CKMBINDEX -- -- --  TROPONINI <0.30 <0.30 <0.30   BNP (last 3 results)  Basename 10/16/12 1408  PROBNP 51.4   CBG: No results found for this basename: GLUCAP:5 in the last 168 hours  No results found for this or any previous visit (from the past 240 hour(s)).   Studies: US Abdomen Complete  10/16/2012  *RADIOLOGY REPORT*  Clinical Data:  Right upper quadrant abdominal pain, chest pain, elevated enzymes  COMPLETE ABDOMINAL ULTRASOUND  Comparison:  CT abdomen and pelvis - 07/26/2011  Findings:  Gallbladder:  Sonographically normal.  No echogenic gallstones or gall sludge.  No gallbladder wall thickening or pericholecystic fluid.  Negative sonographic Murphy's sign.  Common bile duct:  Borderline enlarged measuring 7.5 mm in diameter  Liver:  There is diffuse increased echogenicity of the hepatic parenchyma suggestive of hepatic steatosis.  There is possible minimal amount of focal fatty sparing  adjacent to the gallbladder fossa (image 41).  No discrete hepatic lesions.  No definite intrahepatic biliary duct dilatation.  No ascites.  IVC:  Appears normal.  Pancreas:  Limited visualization of the pancreatic head and neck is normal.  Visualization of the pancreatic body and tail is obscured by bowel gas.  Spleen:  Normal in size measuring 9.2 cm in length  Right Kidney:  Normal cortical thickness, echogenicity and size, measuring 11.9 cm in length.  No focal renal lesions.  No echogenic renal stones.  Resolution of previously noted right-sided pelvicaliectasis as demonstrated on prior abdominal CT.  No urinary obstruction.  Left Kidney:  Normal cortical thickness, echogenicity and size, measuring 13.6 cm in length.  No focal renal lesions.  No  echogenic renal stones.  No urinary obstruction.  Abdominal aorta:  No aneurysm identified.  IMPRESSION:  1.   Findings suggestive of hepatic steatosis with focal fatty sparing adjacent to the gallbladder fossa.  2.  Borderline enlargement of the caliber of the common bile duct, the etiology of which is not depicted on this examination.  Further evaluation with MRCP may be performed as clinically indicated.   Original Report Authenticated By: Tacey Ruiz, MD    Nm Myocar Multi W/spect W/wall Motion / Ef  10/16/2012  Clinical Data:  Chest pain.  Tobacco use.  Hyper lipidemia. Coronary artery disease.  Peripheral vascular disease.  Technique:  Standard myocardial SPECT imaging performed after resting intravenous injection of Tc-53m tetrofosmin.  Subsequently, stress in the form of Lexiscan  was administered under the supervision of the Cardiology staff.  At peak stress, Tc-44m tetrofosmin was injected intravenously and standard myocardial SPECT imaging performed.  Quantitative gated imaging also performed to evaluate left ventricular wall motion and estimate left ventricular ejection fraction.  Radiopharmaceutical: Tc-56m tetrofosmin, 10 mCi at rest and 30 mCi at stress.  Comparison:  None  MYOCARDIAL IMAGING WITH SPECT (REST AND STRESS)  Findings:  Reduced activity and apical segments, especially anteriorly and laterally, on both stress and rest images favors scarring.  No inducible ischemia identified.  LEFT VENTRICULAR EJECTION FRACTION  Findings:  Left ventricular end-diastolic volume is 81 cc.  End- systolic volume is 31 cc.  Derived LV ejection fraction is 62%.  GATED LEFT VENTRICULAR WALL MOTION STUDY  Findings:  Wall motion unremarkable.  Mildly reduced wall thickening of the cardiac apex.  IMPRESSION:  1.  Suspected scarring at the cardiac apex, without inducible ischemia. Left ventricular ejection fraction calculated at 62%.   Original Report Authenticated By: Gaylyn Rong, M.D.     Scheduled  Meds:   . ALPRAZolam  0.5 mg Oral Daily  . aspirin EC  81 mg Oral Daily  . atorvastatin  80 mg Oral Daily  . buPROPion  300 mg Oral Daily  . clopidogrel  75 mg Oral Q breakfast  . darifenacin  7.5 mg Oral Daily  . enoxaparin (LOVENOX) injection  40 mg Subcutaneous Q24H  . fenofibrate  160 mg Oral Daily  . furosemide  20 mg Oral BID  . lisinopril  5 mg Oral Daily  . metoprolol succinate  50 mg Oral Daily  . mometasone-formoterol  2 puff Inhalation BID  . omega-3 acid ethyl esters  4 g Oral Daily  . pantoprazole  40 mg Oral BID  . pregabalin  75 mg Oral BID  . sertraline  100 mg Oral Daily  . sodium chloride  3 mL Intravenous Q12H  . sodium chloride  3 mL Intravenous Q12H  Continuous Infusions:    Marinda Elk  [facility] Pager [phone].  If 8PM-8AM, please contact night-coverage at [email or web address], password [email or web address] 10/17/2012, 10:04 AM  LOS: 1 day

## 2012-10-17 NOTE — Progress Notes (Signed)
Patient ID: Sonya Small, female   DOB: October 20, 1950, 62 y.o.   MRN: [id number]    SUBJECTIVE: No further chest pain.  She continues to have RUQ pain after eating.      [name] ALPRAZolam  0.5 mg Oral Daily  . aspirin EC  81 mg Oral Daily  . atorvastatin  80 mg Oral Daily  . buPROPion  300 mg Oral Daily  . clopidogrel  75 mg Oral Q breakfast  . darifenacin  7.5 mg Oral Daily  . enoxaparin (LOVENOX) injection  40 mg Subcutaneous Q24H  . fenofibrate  160 mg Oral Daily  . furosemide  20 mg Oral BID  . lisinopril  5 mg Oral Daily  . metoprolol succinate  50 mg Oral Daily  . mometasone-formoterol  2 puff Inhalation BID  . omega-3 acid ethyl esters  4 g Oral Daily  . pantoprazole  40 mg Oral BID  . pregabalin  75 mg Oral BID  . sertraline  100 mg Oral Daily  . sodium chloride  3 mL Intravenous Q12H  . sodium chloride  3 mL Intravenous Q12H      Filed Vitals:   10/16/12 1435 10/16/12 1900 10/16/12 1947 10/17/12 0408  BP: 104/65  117/65 150/80  Pulse:  67 77 73  Temp:   98.2 F (36.8 C) 97.3 F (36.3 C)  TempSrc:   Oral Oral  Resp:  18 18 18   Height:      Weight:      SpO2: 100% 96% 95% 100%    Intake/Output Summary (Last 24 hours) at 10/17/12 0812 Last data filed at 10/16/12 2143  Gross per 24 hour  Intake    603 ml  Output      0 ml  Net    603 ml    LABS: Basic Metabolic Panel:  Basename 10/17/12 0515 10/16/12 0640  NA 143 141  K 4.0 3.9  CL 106 104  CO2 27 25  GLUCOSE 102* 93  BUN 15 20  CREATININE 0.63 0.70  CALCIUM 9.2 8.9  MG -- --  PHOS -- --   Liver Function Tests:  Basename 10/16/12 0640  AST 27  ALT 35  ALKPHOS 76  BILITOT 0.2*  PROT 6.2  ALBUMIN 3.2*   No results found for this basename: LIPASE:2,AMYLASE:2 in the last 72 hours CBC:  Basename 10/17/12 0515 10/16/12 0640  WBC 6.7 8.2  NEUTROABS -- 4.4  HGB 13.1 13.0  HCT 42.3 40.3  MCV 89.4 89.0  PLT 211 225   Cardiac Enzymes:  Basename 10/16/12 1757 10/16/12 1408 10/16/12 0640    CKTOTAL -- -- --  CKMB -- -- --  CKMBINDEX -- -- --  TROPONINI <0.30 <0.30 <0.30   BNP: No components found with this basename: POCBNP:3 D-Dimer: No results found for this basename: DDIMER:2 in the last 72 hours Hemoglobin A1C: No results found for this basename: HGBA1C in the last 72 hours Fasting Lipid Panel:  Basename 10/16/12 0640  CHOL 172  HDL 37*  LDLCALC 75  TRIG 784*  CHOLHDL 4.6  LDLDIRECT --   Thyroid Function Tests:  Basename 10/16/12 0640  TSH 1.484  T4TOTAL --  T3FREE --  THYROIDAB --   Anemia Panel: No results found for this basename: VITAMINB12,FOLATE,FERRITIN,TIBC,IRON,RETICCTPCT in the last 72 hours  RADIOLOGY: US Abdomen Complete  10/16/2012  *RADIOLOGY REPORT*  Clinical Data:  Right upper quadrant abdominal pain, chest pain, elevated enzymes  COMPLETE ABDOMINAL ULTRASOUND  Comparison:  CT abdomen and pelvis -  07/26/2011  Findings:  Gallbladder:  Sonographically normal.  No echogenic gallstones or gall sludge.  No gallbladder wall thickening or pericholecystic fluid.  Negative sonographic Murphy's sign.  Common bile duct:  Borderline enlarged measuring 7.5 mm in diameter  Liver:  There is diffuse increased echogenicity of the hepatic parenchyma suggestive of hepatic steatosis.  There is possible minimal amount of focal fatty sparing adjacent to the gallbladder fossa (image 41).  No discrete hepatic lesions.  No definite intrahepatic biliary duct dilatation.  No ascites.  IVC:  Appears normal.  Pancreas:  Limited visualization of the pancreatic head and neck is normal.  Visualization of the pancreatic body and tail is obscured by bowel gas.  Spleen:  Normal in size measuring 9.2 cm in length  Right Kidney:  Normal cortical thickness, echogenicity and size, measuring 11.9 cm in length.  No focal renal lesions.  No echogenic renal stones.  Resolution of previously noted right-sided pelvicaliectasis as demonstrated on prior abdominal CT.  No urinary obstruction.   Left Kidney:  Normal cortical thickness, echogenicity and size, measuring 13.6 cm in length.  No focal renal lesions.  No echogenic renal stones.  No urinary obstruction.  Abdominal aorta:  No aneurysm identified.  IMPRESSION:  1.   Findings suggestive of hepatic steatosis with focal fatty sparing adjacent to the gallbladder fossa.  2.  Borderline enlargement of the caliber of the common bile duct, the etiology of which is not depicted on this examination.  Further evaluation with MRCP may be performed as clinically indicated.   Original Report Authenticated By: Tacey Ruiz, MD    Nm Myocar Multi W/spect W/wall Motion / Ef  10/16/2012  Clinical Data:  Chest pain.  Tobacco use.  Hyper lipidemia. Coronary artery disease.  Peripheral vascular disease.  Technique:  Standard myocardial SPECT imaging performed after resting intravenous injection of Tc-35m tetrofosmin.  Subsequently, stress in the form of Lexiscan  was administered under the supervision of the Cardiology staff.  At peak stress, Tc-49m tetrofosmin was injected intravenously and standard myocardial SPECT imaging performed.  Quantitative gated imaging also performed to evaluate left ventricular wall motion and estimate left ventricular ejection fraction.  Radiopharmaceutical: Tc-31m tetrofosmin, 10 mCi at rest and 30 mCi at stress.  Comparison:  None  MYOCARDIAL IMAGING WITH SPECT (REST AND STRESS)  Findings:  Reduced activity and apical segments, especially anteriorly and laterally, on both stress and rest images favors scarring.  No inducible ischemia identified.  LEFT VENTRICULAR EJECTION FRACTION  Findings:  Left ventricular end-diastolic volume is 81 cc.  End- systolic volume is 31 cc.  Derived LV ejection fraction is 62%.  GATED LEFT VENTRICULAR WALL MOTION STUDY  Findings:  Wall motion unremarkable.  Mildly reduced wall thickening of the cardiac apex.  IMPRESSION:  1.  Suspected scarring at the cardiac apex, without inducible ischemia. Left  ventricular ejection fraction calculated at 62%.   Original Report Authenticated By: Gaylyn Rong, M.D.     PHYSICAL EXAM General: NAD Neck: No JVD, no thyromegaly or thyroid nodule.  Lungs: Clear to auscultation bilaterally with normal respiratory effort. CV: Nondisplaced PMI.  Heart regular S1/S2, no S3/S4, no murmur.  No peripheral edema.  No carotid bruit.  Normal pedal pulses.  Abdomen: Soft, nontender, no hepatosplenomegaly, no distention.  Neurologic: Alert and oriented x 3.  Psych: Normal affect. Extremities: No clubbing or cyanosis.   TELEMETRY: Reviewed telemetry pt in NSR  ASSESSMENT AND PLAN:  62 yo with history of CAD presented with episode of chest  pain and RUQ pain after eating.  1. Chest pain: No further symptoms.  EF low normal on echo.  Myoview with fixed apical defect suggestive of prior MI, no ischemia (low risk study).  Probably noncardiac chest pain.  Continue home cardiac meds, no further workup at this time.  Will followup with Dr. [name].  2. RUQ pain: No gallstones in gallbladder on [place] but had dilated CBD.  Will leave workup to [facility], may need MRCP.   Marca Ancona 10/17/2012 8:15 AM

## 2012-10-17 NOTE — Care Management Note (Signed)
    Page 1 of 1   10/17/2012     2:08:27 PM   CARE MANAGEMENT NOTE 10/17/2012  Patient:  Sonya Small, Sonya Small   Account Number:  [id number]  Date Initiated:  10/17/2012  Documentation initiated by:  Pankaj Haack  Subjective/Objective Assessment:   PT ADM ON 10/16/12 WITH CHEST PAIN.  PTA, PT INDEPENDENT, LIVES WITH SISTER.     Action/Plan:   MET WITH PT TO DISCUSS DC NEEDS.  SISTER TO PROVIDE CARE AT DC.  PT CONCERNED ABOUT HOSPITAL BILL PAYMENT.  WILL CONSULT FINANCIAL COUNSELOR.   Anticipated DC Date:  10/17/2012   Anticipated DC Plan:  HOME/SELF CARE      DC Planning Services  CM consult      Choice offered to / List presented to:             Status of service:  Completed, signed off Medicare Important Message given?   (If response is "NO", the following Medicare IM given date fields will be blank) Date Medicare IM given:   Date Additional Medicare IM given:    Discharge Disposition:  HOME/SELF CARE  Per UR Regulation:  Reviewed for med. necessity/level of care/duration of stay  If discussed at Long Length of Stay Meetings, dates discussed:    Comments:

## 2012-10-18 DIAGNOSIS — E785 Hyperlipidemia, unspecified: Secondary | ICD-10-CM

## 2012-10-18 MED ORDER — PANTOPRAZOLE SODIUM 40 MG PO TBEC: 40.0000 mg | DELAYED_RELEASE_TABLET | Freq: Every day | ORAL | Status: DC

## 2012-10-18 MED ORDER — ASPIRIN 81 MG PO TBEC: 81.0000 mg | DELAYED_RELEASE_TABLET | Freq: Every day | ORAL | Status: DC

## 2012-10-18 NOTE — Progress Notes (Signed)
Pt given discharge instructions, medication lists, follow up appointments, and when to call the doctor.  Pt verbalizes understanding. Sonya Small   

## 2012-10-18 NOTE — Discharge Summary (Signed)
Physician Discharge Summary  Sonya Small:[id number] DOB: December 03, 1950 DOA: 10/16/2012  PCP: Blane Ohara, MD  Admit date: 10/16/2012 Discharge date: 10/18/2012  Time spent: 35 minutes  Recommendations for Outpatient Follow-up:  1. Follow up with PCP  Discharge Diagnoses:  Principal Problem:  *Chest pain Active Problems:  Coronary artery disease  PAD (peripheral artery disease)  Hyperlipidemia   Discharge Condition: stable  Diet recommendation: heart healthy diet  Filed Weights   10/16/12 0441  Weight: 87.363 kg (192 lb 9.6 oz)    History of present illness:  62 year-old female with history of CAD, peripheral arterial disease, COPD and ongoing tobacco abuse presented to the ER at [facility] with complaints of chest pain. Last evening while patient was in the church at around 7:30 PM start helping chest pain which was pressure across the chest radiating to her left arm. The pressure lasted for around half an hour and had gone to the ER where nitroglycerin paste relieve the chest pain. Over there in the ER cardiac enzymes were negative and patient became chest pain-free and was planned to be admitted for further observation and at that point patient wanted to be transferred to [facility] has a cardiologist was here. Patient after reaching at [facility] started having another episode of chest pain which got relieved after morphine and sublingual nitroglycerin. Presently patient is chest pain-free. Repeat EKG done here does not show anything acute. Patient has been admitted for further management.  Labs done at [facility] show normal chest x-ray negative cardiac enzyme negative d-dimer, sodium 142, potassium 3.9 bicarbonate 25 creatinine one post 127 calcium 9.3 WBC 10 hemoglobin 14 platelets 268.   Hospital Course:  Chest pain/epigastric, most likely muscular skeletal: - Myoview negative no further cardiac w/u.  - Ct abdomen and pelvis showed no stone, LFT's with normal limits.  Pain reproducible by palpation. - no rashes, tender in the costal area reproducible by palpation, check a CXR showed no fracture. - PPI.  PAD (peripheral artery disease) () - cont ASA   Hyperlipidemia () -statins   Procedures: Ct abdomen and pelvis 1.24.2014: no stone no bile duct dilation Myoview no inducible ischemia [place] abd: Findings suggestive of hepatic steatosis   Consultations:  cardiology  Discharge Exam: Filed Vitals:   10/17/12 0408 10/17/12 2034 10/17/12 2125 10/18/12 0409  BP: 150/80 120/71  109/69  Pulse: 73 73 74 75  Temp: 97.3 F (36.3 C) 98.3 F (36.8 C)  98.3 F (36.8 C)  TempSrc: Oral Oral  Oral  Resp: 18 18 18 19   Height:      Weight:      SpO2: 100% 98% 98% 96%    General: A&O x3 Cardiovascular: RRR Respiratory: good air movement CTA B/L  Discharge Instructions      Discharge Orders    Future Appointments: Provider: Department: Dept Phone: Center:   10/27/2012 10:00 AM Rosalio Macadamia, NP [facility] Main Office [place]) [phone] [email or web address]   11/18/2012 9:00 AM [facility] [phone] [facility]   11/18/2012 10:00 AM [facility] [phone] [facility]   11/18/2012 10:40 AM Evern Bio, NP [facility] [phone] [facility]     Future Orders Please Complete By Expires   Diet - low sodium heart healthy      Increase activity slowly          Medication List     As of 10/18/2012 10:43 AM  TAKE these medications         ADVAIR DISKUS 500-50 MCG/DOSE Aepb   Generic drug: Fluticasone-Salmeterol      ALPRAZolam 0.25 MG tablet   Commonly known as: XANAX   Take 1 tablet (0.25 mg total) by mouth at bedtime as needed for sleep.      aspirin 325 MG tablet   Take 81 mg by mouth daily.      aspirin 81 MG EC tablet   Take 1 tablet (81 mg total) by mouth daily.      atorvastatin 80 MG tablet   Commonly known as: LIPITOR   Take  80 mg by mouth daily.      buPROPion 300 MG 24 hr tablet   Commonly known as: WELLBUTRIN XL   Take 300 mg by mouth daily.      Choline Fenofibrate 135 MG capsule   Take 1 capsule (135 mg total) by mouth daily.      clopidogrel 75 MG tablet   Commonly known as: PLAVIX   Take 1 tablet (75 mg total) by mouth daily.      COMBIVENT RESPIMAT 20-100 MCG/ACT Aers respimat   Generic drug: Ipratropium-Albuterol      esomeprazole 40 MG capsule   Commonly known as: NEXIUM   Take 1 capsule (40 mg total) by mouth daily before breakfast.      fish oil-omega-3 fatty acids 1000 MG capsule   Take 4 g by mouth daily.      furosemide 20 MG tablet   Commonly known as: LASIX   Take 20 mg by mouth 2 (two) times daily.      hydrochlorothiazide 12.5 MG tablet   Commonly known as: HYDRODIURIL   Take one tablet daily      HYDROcodone-acetaminophen 10-325 MG per tablet   Commonly known as: NORCO   Take 1 tablet by mouth every 6 (six) hours as needed.      lisinopril 10 MG tablet   Commonly known as: PRINIVIL,ZESTRIL   Take 1 tablet (10 mg total) by mouth daily.      methocarbamol 500 MG tablet   Commonly known as: ROBAXIN      metoprolol succinate 50 MG 24 hr tablet   Commonly known as: TOPROL-XL   Take 50 mg by mouth daily.      nitroGLYCERIN 0.4 MG SL tablet   Commonly known as: NITROSTAT   Place 1 tablet (0.4 mg total) under the tongue every 5 (five) minutes as needed.      oxyCODONE-acetaminophen 10-325 MG per tablet   Commonly known as: PERCOCET      pantoprazole 40 MG tablet   Commonly known as: PROTONIX   Take 1 tablet (40 mg total) by mouth daily.      pregabalin 75 MG capsule   Commonly known as: LYRICA   Take 75 mg by mouth 2 (two) times daily.      sertraline 100 MG tablet   Commonly known as: ZOLOFT      VESICARE 5 MG tablet   Generic drug: solifenacin   Take 5 mg by mouth daily.        Follow-up Information    Follow up with Norma Fredrickson, NP. (10/27/12 at 10am)     Contact information:   [facility] [address] [place] [address] [phone]           The results of significant diagnostics from this hospitalization (including imaging, microbiology, ancillary and laboratory) are listed below  for reference.    Significant Diagnostic Studies: Dg Ribs Unilateral W/chest Right  10/17/2012  *RADIOLOGY REPORT*  Clinical Data: Right upper quadrant pain  RIGHT RIBS AND CHEST - 3+ VIEW  Comparison: 01/22/ 14  Findings: Four views right wrist submitted.  No acute infiltrate or pulmonary edema.  No right rib fracture is identified.  No diagnostic pneumothorax.  IMPRESSION: No right rib fracture is identified.  No diagnostic pneumothorax.   Original Report Authenticated By: Natasha Mead, M.D.    US Abdomen Complete  10/16/2012  *RADIOLOGY REPORT*  Clinical Data:  Right upper quadrant abdominal pain, chest pain, elevated enzymes  COMPLETE ABDOMINAL ULTRASOUND  Comparison:  CT abdomen and pelvis - 07/26/2011  Findings:  Gallbladder:  Sonographically normal.  No echogenic gallstones or gall sludge.  No gallbladder wall thickening or pericholecystic fluid.  Negative sonographic Murphy's sign.  Common bile duct:  Borderline enlarged measuring 7.5 mm in diameter  Liver:  There is diffuse increased echogenicity of the hepatic parenchyma suggestive of hepatic steatosis.  There is possible minimal amount of focal fatty sparing adjacent to the gallbladder fossa (image 41).  No discrete hepatic lesions.  No definite intrahepatic biliary duct dilatation.  No ascites.  IVC:  Appears normal.  Pancreas:  Limited visualization of the pancreatic head and neck is normal.  Visualization of the pancreatic body and tail is obscured by bowel gas.  Spleen:  Normal in size measuring 9.2 cm in length  Right Kidney:  Normal cortical thickness, echogenicity and size, measuring 11.9 cm in length.  No focal renal lesions.  No echogenic renal stones.  Resolution of  previously noted right-sided pelvicaliectasis as demonstrated on prior abdominal CT.  No urinary obstruction.  Left Kidney:  Normal cortical thickness, echogenicity and size, measuring 13.6 cm in length.  No focal renal lesions.  No echogenic renal stones.  No urinary obstruction.  Abdominal aorta:  No aneurysm identified.  IMPRESSION:  1.   Findings suggestive of hepatic steatosis with focal fatty sparing adjacent to the gallbladder fossa.  2.  Borderline enlargement of the caliber of the common bile duct, the etiology of which is not depicted on this examination.  Further evaluation with MRCP may be performed as clinically indicated.   Original Report Authenticated By: Tacey Ruiz, MD    Ct Abdomen Pelvis W Contrast  10/17/2012  *RADIOLOGY REPORT*  Clinical Data: Evaluate common bile duct.  Abdominal pain, worse after eating.  CT ABDOMEN AND PELVIS WITH CONTRAST  Technique:  Multidetector CT imaging of the abdomen and pelvis was performed following the standard protocol during bolus administration of intravenous contrast.  Contrast: OMNIPAQUE IOHEXOL 300 MG/ML  SOLN  Comparison: Ultrasound of 10/16/2012.  CT of 07/26/2011.  Findings: Lung bases:  Clear lung bases.  Normal heart size without pericardial or pleural effusion.  Abdomen/pelvis:  Mild hepatic steatosis, without focal hepatic lesion.  Hepatomegaly, greater than 22 cm cranial caudal.  Normal spleen.  Underdistended proximal stomach.  Normal pancreas, gallbladder.  No intra or extrahepatic biliary ductal dilatation. The common duct measures maximally 6 mm on coronal image 68/series 5.  No obstructive stone or mass.  Normal left adrenal gland.  A right adrenal nodule measures 2.0 cm and is similar in size to 07/26/2011 and 12/03/2008.  Too small to characterize bilateral renal lesions. Lower pole left renal calculus, nonobstructive.  Advanced aortic atherosclerosis. No retroperitoneal or retrocrural adenopathy.  Normal colon, appendix, and  terminal ileum.  Normal small bowel without abdominal ascites.  Status  post right common iliac artery stent, with distal patency. No pelvic adenopathy.  Significant atherosclerosis within the left common iliac artery as well.  Hysterectomy.  Mild pelvic floor laxity. No significant free fluid. .  Bones/Musculoskeletal:  Prior lumbar laminectomies. No acute osseous abnormality. Disc bulges at L4-L5 and L5-S1.  IMPRESSION:  1.  No biliary ductal dilatation or obstructive stone/mass. 2.  Hepatic steatosis and hepatomegaly. 3.  Left nephrolithiasis. 4.  Pelvic floor laxity.   Original Report Authenticated By: Jeronimo Greaves, M.D.    Nm Myocar Multi W/spect W/wall Motion / Ef  10/16/2012  Clinical Data:  Chest pain.  Tobacco use.  Hyper lipidemia. Coronary artery disease.  Peripheral vascular disease.  Technique:  Standard myocardial SPECT imaging performed after resting intravenous injection of Tc-69m tetrofosmin.  Subsequently, stress in the form of Lexiscan  was administered under the supervision of the Cardiology staff.  At peak stress, Tc-1m tetrofosmin was injected intravenously and standard myocardial SPECT imaging performed.  Quantitative gated imaging also performed to evaluate left ventricular wall motion and estimate left ventricular ejection fraction.  Radiopharmaceutical: Tc-73m tetrofosmin, 10 mCi at rest and 30 mCi at stress.  Comparison:  None  MYOCARDIAL IMAGING WITH SPECT (REST AND STRESS)  Findings:  Reduced activity and apical segments, especially anteriorly and laterally, on both stress and rest images favors scarring.  No inducible ischemia identified.  LEFT VENTRICULAR EJECTION FRACTION  Findings:  Left ventricular end-diastolic volume is 81 cc.  End- systolic volume is 31 cc.  Derived LV ejection fraction is 62%.  GATED LEFT VENTRICULAR WALL MOTION STUDY  Findings:  Wall motion unremarkable.  Mildly reduced wall thickening of the cardiac apex.  IMPRESSION:  1.  Suspected scarring at the cardiac  apex, without inducible ischemia. Left ventricular ejection fraction calculated at 62%.   Original Report Authenticated By: Gaylyn Rong, M.D.     Microbiology: No results found for this or any previous visit (from the past 240 hour(s)).   Labs: Basic Metabolic Panel:  Lab 10/17/12 1610 10/16/12 0640  NA 143 141  K 4.0 3.9  CL 106 104  CO2 27 25  GLUCOSE 102* 93  BUN 15 20  CREATININE 0.63 0.70  CALCIUM 9.2 8.9  MG -- --  PHOS -- --   Liver Function Tests:  Lab 10/16/12 0640  AST 27  ALT 35  ALKPHOS 76  BILITOT 0.2*  PROT 6.2  ALBUMIN 3.2*   No results found for this basename: LIPASE:5,AMYLASE:5 in the last 168 hours No results found for this basename: AMMONIA:5 in the last 168 hours CBC:  Lab 10/17/12 0515 10/16/12 0640  WBC 6.7 8.2  NEUTROABS -- 4.4  HGB 13.1 13.0  HCT 42.3 40.3  MCV 89.4 89.0  PLT 211 225   Cardiac Enzymes:  Lab 10/16/12 1757 10/16/12 1408 10/16/12 0640  CKTOTAL -- -- --  CKMB -- -- --  CKMBINDEX -- -- --  TROPONINI <0.30 <0.30 <0.30   BNP: BNP (last 3 results)  Basename 10/16/12 1408  PROBNP 51.4   CBG: No results found for this basename: GLUCAP:5 in the last 168 hours     Signed:  Marinda Elk  [facility] 10/18/2012, 10:43 AM

## 2012-10-18 NOTE — Progress Notes (Signed)
No further chest pain. No further workup in hospital from cardiac standpoint. Okay for discharge soon and follow up in office with Dr. [name]. Will sign off.

## 2012-10-19 NOTE — Progress Notes (Deleted)
Pt given discharge instructions, medication lists, follow up appointments, and when to call the doctor.  Pt verbalizes understanding. Sonya Small   

## 2012-10-27 ENCOUNTER — Encounter: Payer: Self-pay | Admitting: Nurse Practitioner

## 2012-10-27 ENCOUNTER — Ambulatory Visit (INDEPENDENT_AMBULATORY_CARE_PROVIDER_SITE_OTHER): Payer: Medicare Other | Admitting: Nurse Practitioner

## 2012-10-27 VITALS — BP 110/68 | HR 64 | Ht 65.0 in | Wt 191.4 lb

## 2012-10-27 DIAGNOSIS — R2681 Unsteadiness on feet: Secondary | ICD-10-CM

## 2012-10-27 DIAGNOSIS — R0989 Other specified symptoms and signs involving the circulatory and respiratory systems: Secondary | ICD-10-CM

## 2012-10-27 DIAGNOSIS — R42 Dizziness and giddiness: Secondary | ICD-10-CM

## 2012-10-27 DIAGNOSIS — W19XXXA Unspecified fall, initial encounter: Secondary | ICD-10-CM

## 2012-10-27 DIAGNOSIS — I739 Peripheral vascular disease, unspecified: Secondary | ICD-10-CM

## 2012-10-27 DIAGNOSIS — I259 Chronic ischemic heart disease, unspecified: Secondary | ICD-10-CM

## 2012-10-27 DIAGNOSIS — R51 Headache: Secondary | ICD-10-CM

## 2012-10-27 DIAGNOSIS — R269 Unspecified abnormalities of gait and mobility: Secondary | ICD-10-CM

## 2012-10-27 DIAGNOSIS — I639 Cerebral infarction, unspecified: Secondary | ICD-10-CM

## 2012-10-27 DIAGNOSIS — I6350 Cerebral infarction due to unspecified occlusion or stenosis of unspecified cerebral artery: Secondary | ICD-10-CM

## 2012-10-27 NOTE — Progress Notes (Signed)
Sonya Small Date of Birth: April 10, 1951 Medical Record #[id number]  History of Present Illness: [name] is seen back today for a post hospital visit. She is seen for Dr. [name]. She has known CAD with prior NSTEMI back in 2011, PAD, COPD and ongoing tobacco abuse. Last seen here in August and was doing ok. Smoking cessation encouraged.   She was most recently admitted towards the latter part of January with recurrent chest pain. Cardiac enzymes were negative. Myoview was negative. She had a CT of the abdomen and pelvis which showed no stones. Her pain was reportedly reproducible by palpation. No fracture on xray noted.   She comes in today. She is here with her daughter. She is doing ok from a cardiac standpoint. Really has not had any more chest pain. Her daughter is more upset about Jaylianna's balance. Says she has gotten unsteady. She is dropping things. She has a headache. She wonders if she has had a stroke and is asking if she had a stroke work up while recently admitted. They did NOT share these symptoms with the physicians taking care of her while hospitalized. She sees her PCP next week. She remains on Plavix. She is not ready to stop smoking.   Current Outpatient Prescriptions on File Prior to Visit  Medication Sig Dispense Refill  . ADVAIR DISKUS 500-50 MCG/DOSE AEPB       . ALPRAZolam (XANAX) 0.25 MG tablet Take 1 tablet (0.25 mg total) by mouth at bedtime as needed for sleep.  30 tablet  5  . atorvastatin (LIPITOR) 80 MG tablet Take 80 mg by mouth daily.        [name] buPROPion (WELLBUTRIN XL) 300 MG 24 hr tablet Take 300 mg by mouth daily.        . Choline Fenofibrate (TRILIPIX) 135 MG capsule Take 1 capsule (135 mg total) by mouth daily.  30 capsule  5  . clopidogrel (PLAVIX) 75 MG tablet Take 1 tablet (75 mg total) by mouth daily.  90 tablet  5  . COMBIVENT RESPIMAT 20-100 MCG/ACT AERS respimat       . esomeprazole (NEXIUM) 40 MG capsule Take 1 capsule (40 mg total) by mouth daily  before breakfast.  30 capsule  5  . fish oil-omega-3 fatty acids 1000 MG capsule Take 4 g by mouth daily.        . furosemide (LASIX) 20 MG tablet Take 20 mg by mouth 2 (two) times daily.      [name] HYDROcodone-acetaminophen (NORCO) 10-325 MG per tablet Take 1 tablet by mouth every 6 (six) hours as needed.      [name] lisinopril (PRINIVIL,ZESTRIL) 10 MG tablet Take 1 tablet (10 mg total) by mouth daily.  90 tablet  5  . methocarbamol (ROBAXIN) 500 MG tablet       . metoprolol (TOPROL-XL) 50 MG 24 hr tablet Take 75 mg by mouth daily.       . nitroGLYCERIN (NITROSTAT) 0.4 MG SL tablet Place 1 tablet (0.4 mg total) under the tongue every 5 (five) minutes as needed.  25 tablet  2  . pregabalin (LYRICA) 75 MG capsule Take 75 mg by mouth 2 (two) times daily.      . VESICARE 5 MG tablet Take 5 mg by mouth daily.         No Known Allergies  Past Medical History  Diagnosis Date  . CAD (coronary artery disease) 01/2010    a. NSTEMI 11/2008 s/p DES to LCx. b. NSTEMI  01/2010 secondary to thrombotic RCA lesion, med rx.; negative Myoview in January 2014  . PAD (peripheral artery disease)     a. Emboli to R foot 2010 from partially occlusive lesion in R EIA, s/p stenting. - followed by Dr. Arbie Cookey  . Anxiety   . Depression   . Hyperlipidemia   . Tobacco abuse   . Overweight   . OP (osteoporosis)   . HTN (hypertension)   . Lumbar disc disease     Past Surgical History  Procedure Date  . Iliac artery stent     RIGHT ILIAC STENT  . Knee arthroscopy   . Lumbar laminectomy   . Tubal ligation   . Hysterectomy -age 36     History  Smoking status  . Current Every Day Smoker  Smokeless tobacco  . Not on file    History  Alcohol Use No    Family History  Problem Relation Age of Onset  . Alzheimer's disease Mother     Review of Systems: The review of systems is per the HPI.  All other systems were reviewed and are negative.  Physical Exam: BP 110/68  Pulse 64  Ht 5\' 5"  (1.651 m)  Wt 191 lb 6.4  oz (86.818 kg)  BMI 31.85 kg/m2 Patient is pleasant and in no acute distress. Skin is warm and dry. Color is normal.  HEENT is unremarkable but I do appreciate a left carotid bruit. Normocephalic/atraumatic. PERRL. Sclera are nonicteric. Neck is supple. No masses. No JVD. Lungs are clear. Cardiac exam shows a regular rate and rhythm. Abdomen is soft. Extremities are without edema. Gait is a little unsteady. ROM appears intact. No gross neurologic deficits are noted.  LABORATORY DATA:  Lab Results  Component Value Date   WBC 6.7 10/17/2012   HGB 13.1 10/17/2012   HCT 42.3 10/17/2012   PLT 211 10/17/2012   GLUCOSE 102* 10/17/2012   CHOL 172 10/16/2012   TRIG 299* 10/16/2012   HDL 37* 10/16/2012   LDLCALC 75 10/16/2012   ALT 35 10/16/2012   AST 27 10/16/2012   NA 143 10/17/2012   K 4.0 10/17/2012   CL 106 10/17/2012   CREATININE 0.63 10/17/2012   BUN 15 10/17/2012   CO2 27 10/17/2012   TSH 1.484 10/16/2012   INR 0.98 02/10/2010     Echo Study Conclusions   - Left ventricle: The cavity size was normal. Wall thickness was normal. Systolic function was normal. The estimated ejection fraction was in the range of 50% to 55%. Wall motion was normal; there were no regional wall motion abnormalities. Doppler parameters are consistent with abnormal left ventricular relaxation (grade 1 diastolic dysfunction). - Aortic valve: Mild regurgitation. - Mitral valve: Mild regurgitation. - Atrial septum: There was redundancy of the septum, with borderline criteria for aneurysm. - Pulmonary arteries: Systolic pressure was mildly increased. PA peak pressure: 39mm Hg (S).  MYOVIEW IMPRESSION:  1. Suspected scarring at the cardiac apex, without inducible ischemia. Left ventricular ejection fraction calculated at 62%.   Original Report Authenticated By: Gaylyn Rong, M.D.        Last Resulted: 10/16/12 4:04 PM      Assessment / Plan: 1. Chest pain - has known CAD - recent admission for chest  pain with negative cardiac work up. This has basically improved. Smoking cessation and CV risk factor modification is encouraged.   2. Tobacco abuse - cessation is encouraged. She is not ready to quit  3. PVD - has follow up with [facility]  4. Unsteady gait/carotid bruit - will check MRA of head/neck and MRI of the brain to rule out stroke and look at posterior circulation.   Patient is agreeable to this plan and will call if any problems develop in the interim.

## 2012-10-27 NOTE — Patient Instructions (Addendum)
Stay on your current medicines  Stay active  Try to stop smoking  We are going to check a  MRI of the brain (no contrast), MRA of the head (without contrast) and MRA of the neck (with/without contrast) to see if you have had a stroke  See your primary care doctor as planned next week  See Dr. [name] in 6 months.   Call the [facility] office at [phone] if you have any questions, problems or concerns.

## 2012-10-29 ENCOUNTER — Encounter (HOSPITAL_COMMUNITY): Payer: Self-pay

## 2012-10-29 ENCOUNTER — Ambulatory Visit (HOSPITAL_COMMUNITY): Payer: Medicare Other

## 2012-10-29 ENCOUNTER — Ambulatory Visit (HOSPITAL_COMMUNITY)
Admission: RE | Admit: 2012-10-29 | Discharge: 2012-10-29 | Disposition: A | Discharge status: Home / Self Care | Payer: Medicare Other | Source: Ambulatory Visit | Attending: Nurse Practitioner | Admitting: Nurse Practitioner

## 2012-10-29 DIAGNOSIS — J3489 Other specified disorders of nose and nasal sinuses: Secondary | ICD-10-CM | POA: Insufficient documentation

## 2012-10-29 DIAGNOSIS — R51 Headache: Secondary | ICD-10-CM | POA: Insufficient documentation

## 2012-10-29 DIAGNOSIS — I6509 Occlusion and stenosis of unspecified vertebral artery: Secondary | ICD-10-CM | POA: Insufficient documentation

## 2012-10-29 DIAGNOSIS — I259 Chronic ischemic heart disease, unspecified: Secondary | ICD-10-CM

## 2012-10-29 DIAGNOSIS — Z8673 Personal history of transient ischemic attack (TIA), and cerebral infarction without residual deficits: Secondary | ICD-10-CM | POA: Insufficient documentation

## 2012-10-29 DIAGNOSIS — I739 Peripheral vascular disease, unspecified: Secondary | ICD-10-CM | POA: Insufficient documentation

## 2012-10-29 DIAGNOSIS — R42 Dizziness and giddiness: Secondary | ICD-10-CM | POA: Insufficient documentation

## 2012-10-29 DIAGNOSIS — I639 Cerebral infarction, unspecified: Secondary | ICD-10-CM

## 2012-10-29 DIAGNOSIS — I658 Occlusion and stenosis of other precerebral arteries: Secondary | ICD-10-CM | POA: Insufficient documentation

## 2012-10-29 DIAGNOSIS — R2681 Unsteadiness on feet: Secondary | ICD-10-CM

## 2012-10-29 DIAGNOSIS — I6529 Occlusion and stenosis of unspecified carotid artery: Secondary | ICD-10-CM | POA: Insufficient documentation

## 2012-10-29 DIAGNOSIS — R269 Unspecified abnormalities of gait and mobility: Secondary | ICD-10-CM | POA: Insufficient documentation

## 2012-10-29 DIAGNOSIS — R0989 Other specified symptoms and signs involving the circulatory and respiratory systems: Secondary | ICD-10-CM | POA: Insufficient documentation

## 2012-10-29 DIAGNOSIS — W19XXXA Unspecified fall, initial encounter: Secondary | ICD-10-CM

## 2012-10-29 MED ORDER — GADOBENATE DIMEGLUMINE 529 MG/ML IV SOLN
18.0000 mL | Freq: Once | INTRAVENOUS | Status: AC
  Administered 2012-10-29: 18 mL via INTRAVENOUS

## 2012-11-04 ENCOUNTER — Other Ambulatory Visit: Payer: Self-pay | Admitting: *Deleted

## 2012-11-04 DIAGNOSIS — Z48812 Encounter for surgical aftercare following surgery on the circulatory system: Secondary | ICD-10-CM

## 2012-11-04 DIAGNOSIS — I739 Peripheral vascular disease, unspecified: Secondary | ICD-10-CM

## 2012-11-08 ENCOUNTER — Other Ambulatory Visit: Payer: Self-pay

## 2012-11-18 ENCOUNTER — Ambulatory Visit: Payer: Medicare Other | Admitting: Neurosurgery

## 2012-11-18 ENCOUNTER — Other Ambulatory Visit: Payer: Medicare Other

## 2012-12-12 ENCOUNTER — Other Ambulatory Visit: Payer: Self-pay

## 2012-12-12 MED ORDER — CLOPIDOGREL BISULFATE 75 MG PO TABS: 75.0000 mg | ORAL_TABLET | Freq: Every day | ORAL | Status: DC

## 2013-01-20 ENCOUNTER — Ambulatory Visit: Payer: Medicare Other | Admitting: Neurosurgery

## 2013-01-20 ENCOUNTER — Other Ambulatory Visit: Payer: Medicare Other

## 2013-01-23 ENCOUNTER — Encounter: Payer: Self-pay | Admitting: Cardiology

## 2013-01-23 ENCOUNTER — Encounter: Payer: Self-pay | Admitting: Cardiovascular Disease

## 2013-02-13 ENCOUNTER — Encounter: Payer: Self-pay | Admitting: Vascular Surgery

## 2013-02-17 ENCOUNTER — Ambulatory Visit (INDEPENDENT_AMBULATORY_CARE_PROVIDER_SITE_OTHER): Payer: Medicare Other | Admitting: Vascular Surgery

## 2013-02-17 ENCOUNTER — Encounter (INDEPENDENT_AMBULATORY_CARE_PROVIDER_SITE_OTHER): Payer: Medicare Other | Admitting: *Deleted

## 2013-02-17 ENCOUNTER — Encounter: Payer: Self-pay | Admitting: Vascular Surgery

## 2013-02-17 ENCOUNTER — Other Ambulatory Visit (INDEPENDENT_AMBULATORY_CARE_PROVIDER_SITE_OTHER): Payer: Medicare Other | Admitting: *Deleted

## 2013-02-17 VITALS — BP 121/71 | HR 65 | Ht 65.0 in | Wt 197.0 lb

## 2013-02-17 DIAGNOSIS — I739 Peripheral vascular disease, unspecified: Secondary | ICD-10-CM

## 2013-02-17 DIAGNOSIS — Z48812 Encounter for surgical aftercare following surgery on the circulatory system: Secondary | ICD-10-CM

## 2013-02-17 NOTE — Progress Notes (Signed)
[facility] [place]   Patient name: Sonya Small MRN: [id number] DOB: [birth year] Sex: female   Referred by: [name]  Reason for referral:  Chief Complaint  Patient presents with  . Re-evaluation    right EIA pta/stent 11/10/2008 last OV 10/2009 - pt c/o bilateral swelling/pain X3 years    HISTORY OF PRESENT ILLNESS: The patient has today to discuss comfort in her lower extremities. She does have a prior history of atheroemboli to her right foot and was treated with right external iliac stenting. She's had very good long-term result from this. She does not have any area of concern regarding atheroemboli or acute ischemia. Does report multiple symptoms regarding her lower extremities. She does have swelling in her lower extremities which is worse at the end of the day and is somewhat relieved by elevation. She has not been able to tolerate compression garments. She also has total aching of both lower extremities and has been told that she does have degenerative disc disease and had prior back surgery as well  Past Medical History  Diagnosis Date  . CAD (coronary artery disease) 01/2010    a. NSTEMI 11/2008 s/p DES to LCx. b. NSTEMI 01/2010 secondary to thrombotic RCA lesion, med rx.; negative Myoview in January 2014  . PAD (peripheral artery disease)     a. Emboli to R foot 2010 from partially occlusive lesion in R EIA, s/p stenting. - followed by Dr. Arbie Cookey  . Anxiety   . Depression   . Hyperlipidemia   . Tobacco abuse   . Overweight   . OP (osteoporosis)   . HTN (hypertension)   . Lumbar disc disease   . CHF (congestive heart failure)   . Stroke   . Myocardial infarction     Past Surgical History  Procedure Laterality Date  . Iliac artery stent      RIGHT ILIAC STENT  . Knee arthroscopy    . Lumbar laminectomy    . Tubal ligation    . Hysterectomy -age 43      History   Social History  . Marital Status: Legally Separated    Spouse Name: N/A    Number  of Children: 3  . Years of Education: N/A   Occupational History  . DISABLED    Social History Main Topics  . Smoking status: Former Smoker    Quit date: 11/20/2012  . Smokeless tobacco: Not on file  . Alcohol Use: No  . Drug Use: No  . Sexually Active: No   Other Topics Concern  . Not on file   Social History Narrative  . No narrative on file    Family History  Problem Relation Age of Onset  . Alzheimer's disease Mother   . Cancer Brother     Allergies as of 02/17/2013  . (No Known Allergies)    Current Outpatient Prescriptions on File Prior to Visit  Medication Sig Dispense Refill  . ADVAIR DISKUS 500-50 MCG/DOSE AEPB       . ALPRAZolam (XANAX) 0.25 MG tablet Take 1 tablet (0.25 mg total) by mouth at bedtime as needed for sleep.  30 tablet  5  . atorvastatin (LIPITOR) 80 MG tablet Take 80 mg by mouth daily.        . baclofen (LIORESAL) 10 MG tablet Take 10 mg by mouth 2 (two) times daily.      [name] buPROPion (WELLBUTRIN XL) 300 MG 24 hr tablet Take 300 mg by mouth daily.        [name]  Choline Fenofibrate (TRILIPIX) 135 MG capsule Take 1 capsule (135 mg total) by mouth daily.  30 capsule  5  . clopidogrel (PLAVIX) 75 MG tablet Take 1 tablet (75 mg total) by mouth daily.  90 tablet  1  . COMBIVENT RESPIMAT 20-100 MCG/ACT AERS respimat       . fish oil-omega-3 fatty acids 1000 MG capsule Take 4 g by mouth daily.        . furosemide (LASIX) 20 MG tablet Take 20 mg by mouth 2 (two) times daily.      [name] HYDROcodone-acetaminophen (NORCO) 10-325 MG per tablet Take 1 tablet by mouth every 6 (six) hours as needed.      [name] lisinopril (PRINIVIL,ZESTRIL) 10 MG tablet Take 1 tablet (10 mg total) by mouth daily.  90 tablet  5  . methocarbamol (ROBAXIN) 500 MG tablet       . metoprolol (TOPROL-XL) 50 MG 24 hr tablet Take 75 mg by mouth daily.       . nitroGLYCERIN (NITROSTAT) 0.4 MG SL tablet Place 1 tablet (0.4 mg total) under the tongue every 5 (five) minutes as needed.  25 tablet  2  .  pregabalin (LYRICA) 75 MG capsule Take 75 mg by mouth 2 (two) times daily.      . VESICARE 5 MG tablet Take 5 mg by mouth daily.       [name] esomeprazole (NEXIUM) 40 MG capsule Take 1 capsule (40 mg total) by mouth daily before breakfast.  30 capsule  5   No current facility-administered medications on file prior to visit.     REVIEW OF SYSTEMS:  Positives indicated with an "X"  CARDIOVASCULAR:  [x ] chest pain   [ x] chest pressure   [ ]  palpitations   [x ] orthopnea   [x ] dyspnea on exertion   [x]  claudication   [x ] rest pain   [ ]  DVT   [ ]  phlebitis PULMONARY:   [ ]  productive cough   [ ]  asthma   [ ]  wheezing NEUROLOGIC:   [ ]  weakness  [x ] paresthesias  [ ]  aphasia  [ ]  amaurosis  [ ]  dizziness HEMATOLOGIC:   [ ]  bleeding problems   [ ]  clotting disorders MUSCULOSKELETAL:  [ ]  joint pain   [ ]  joint swelling GASTROINTESTINAL: [ ]   blood in stool  [ ]   hematemesis GENITOURINARY:  [ ]   dysuria  [ ]   hematuria PSYCHIATRIC:  [ ]  history of major depression INTEGUMENTARY:  [ ]  rashes  [ ]  ulcers CONSTITUTIONAL:  [ ]  fever   [ ]  chills  PHYSICAL EXAMINATION:  General: The patient is a well-nourished female, in no acute distress. Vital signs are BP 121/71  Pulse 65  Ht 5\' 5"  (1.651 m)  Wt 197 lb (89.359 kg)  BMI 32.78 kg/m2  SpO2 100% Pulmonary: There is a good air exchange bilaterally without wheezing or rales. Abdomen: Soft and non-tender with normal pitch bowel sounds. Musculoskeletal: There are no major deformities.  There is no significant extremity pain. Neurologic: No focal weakness or paresthesias are detected, Skin: There are no ulcer or rashes noted. Psychiatric: The patient has normal affect. Cardiovascular: There is a regular rate and rhythm without significant murmur appreciated. Pulse status: 2+ radial 2+ femoral and 2+ dorsalis pedis pulses bilaterally  VVS Vascular Lab Studies:  Ordered and Independently Reviewed this reveals normal ankle arm index  bilaterally and normal triphasic waveforms bilaterally  Impression and Plan:  Oral lower  extremity arterial exam. I had a long discussion with the patient explaining this. I do not see any evidence of vascular pathology to explain her discomfort. This appears to be mostly related to those fluid overload and edema and distal degenerative disc disease as well. The patient was reassured this discussion will continue to see [place] on an as-needed basis    Zaylon Bossier [facility] [place] Office: [phone]

## 2013-06-08 ENCOUNTER — Other Ambulatory Visit: Payer: Self-pay

## 2013-06-08 MED ORDER — CLOPIDOGREL BISULFATE 75 MG PO TABS: 75.0000 mg | ORAL_TABLET | Freq: Every day | ORAL | Status: DC

## 2013-07-30 ENCOUNTER — Other Ambulatory Visit: Payer: Self-pay

## 2014-05-28 ENCOUNTER — Telehealth: Payer: Self-pay | Admitting: Cardiology

## 2014-05-28 NOTE — Telephone Encounter (Signed)
Pt. Called and stated she has been having CP since last night with SOB pt encouraged to go to the ER at [facility] and trish call and message left

## 2014-05-28 NOTE — Telephone Encounter (Signed)
New problem   Pt is hurting across her chest and both sides of neck. Pt would like to be seen today. Pt took nitro last night. Please call.

## 2014-07-12 ENCOUNTER — Emergency Department (HOSPITAL_COMMUNITY): Payer: Medicare Other

## 2014-07-12 ENCOUNTER — Encounter (HOSPITAL_COMMUNITY): Payer: Self-pay | Admitting: Emergency Medicine

## 2014-07-12 ENCOUNTER — Inpatient Hospital Stay (HOSPITAL_COMMUNITY)
Admission: EM | Admit: 2014-07-12 | Discharge: 2014-07-13 | Disposition: A | Discharge status: Home / Self Care | Payer: Medicare Other | Attending: Internal Medicine | Admitting: Internal Medicine

## 2014-07-12 DIAGNOSIS — E785 Hyperlipidemia, unspecified: Secondary | ICD-10-CM | POA: Diagnosis present

## 2014-07-12 DIAGNOSIS — I739 Peripheral vascular disease, unspecified: Secondary | ICD-10-CM | POA: Diagnosis present

## 2014-07-12 DIAGNOSIS — Z79899 Other long term (current) drug therapy: Secondary | ICD-10-CM

## 2014-07-12 DIAGNOSIS — I5032 Chronic diastolic (congestive) heart failure: Secondary | ICD-10-CM | POA: Diagnosis present

## 2014-07-12 DIAGNOSIS — R29810 Facial weakness: Secondary | ICD-10-CM | POA: Diagnosis not present

## 2014-07-12 DIAGNOSIS — F418 Other specified anxiety disorders: Secondary | ICD-10-CM | POA: Diagnosis present

## 2014-07-12 DIAGNOSIS — R202 Paresthesia of skin: Secondary | ICD-10-CM | POA: Diagnosis present

## 2014-07-12 DIAGNOSIS — J449 Chronic obstructive pulmonary disease, unspecified: Secondary | ICD-10-CM | POA: Diagnosis present

## 2014-07-12 DIAGNOSIS — F329 Major depressive disorder, single episode, unspecified: Secondary | ICD-10-CM | POA: Diagnosis present

## 2014-07-12 DIAGNOSIS — I2510 Atherosclerotic heart disease of native coronary artery without angina pectoris: Secondary | ICD-10-CM | POA: Diagnosis present

## 2014-07-12 DIAGNOSIS — Z6830 Body mass index (BMI) 30.0-30.9, adult: Secondary | ICD-10-CM

## 2014-07-12 DIAGNOSIS — R4781 Slurred speech: Secondary | ICD-10-CM | POA: Diagnosis present

## 2014-07-12 DIAGNOSIS — E669 Obesity, unspecified: Secondary | ICD-10-CM | POA: Diagnosis present

## 2014-07-12 DIAGNOSIS — N390 Urinary tract infection, site not specified: Secondary | ICD-10-CM | POA: Diagnosis present

## 2014-07-12 DIAGNOSIS — I63511 Cerebral infarction due to unspecified occlusion or stenosis of right middle cerebral artery: Secondary | ICD-10-CM | POA: Diagnosis not present

## 2014-07-12 DIAGNOSIS — I639 Cerebral infarction, unspecified: Secondary | ICD-10-CM

## 2014-07-12 DIAGNOSIS — I10 Essential (primary) hypertension: Secondary | ICD-10-CM | POA: Diagnosis present

## 2014-07-12 DIAGNOSIS — M810 Age-related osteoporosis without current pathological fracture: Secondary | ICD-10-CM | POA: Diagnosis present

## 2014-07-12 DIAGNOSIS — Z720 Tobacco use: Secondary | ICD-10-CM

## 2014-07-12 DIAGNOSIS — I214 Non-ST elevation (NSTEMI) myocardial infarction: Secondary | ICD-10-CM

## 2014-07-12 DIAGNOSIS — Z7982 Long term (current) use of aspirin: Secondary | ICD-10-CM

## 2014-07-12 DIAGNOSIS — I252 Old myocardial infarction: Secondary | ICD-10-CM

## 2014-07-12 DIAGNOSIS — G4733 Obstructive sleep apnea (adult) (pediatric): Secondary | ICD-10-CM | POA: Diagnosis present

## 2014-07-12 DIAGNOSIS — Z955 Presence of coronary angioplasty implant and graft: Secondary | ICD-10-CM

## 2014-07-12 DIAGNOSIS — Z87891 Personal history of nicotine dependence: Secondary | ICD-10-CM

## 2014-07-12 DIAGNOSIS — E782 Mixed hyperlipidemia: Secondary | ICD-10-CM | POA: Diagnosis present

## 2014-07-12 HISTORY — DX: Gastro-esophageal reflux disease without esophagitis: K21.9

## 2014-07-12 HISTORY — DX: Sleep apnea, unspecified: G47.30

## 2014-07-12 HISTORY — DX: Transient cerebral ischemic attack, unspecified: G45.9

## 2014-07-12 HISTORY — DX: Headache: R51

## 2014-07-12 HISTORY — DX: Headache, unspecified: R51.9

## 2014-07-12 LAB — CBC WITH DIFFERENTIAL/PLATELET
BASOS ABS: 0 10*3/uL (ref 0.0–0.1)
Basophils Relative: 0 % (ref 0–1)
EOS ABS: 0.1 10*3/uL (ref 0.0–0.7)
EOS PCT: 1 % (ref 0–5)
HEMATOCRIT: 43.3 % (ref 36.0–46.0)
Hemoglobin: 13.6 g/dL (ref 12.0–15.0)
LYMPHS PCT: 28 % (ref 12–46)
Lymphs Abs: 3.1 10*3/uL (ref 0.7–4.0)
MCH: 27.7 pg (ref 26.0–34.0)
MCHC: 31.4 g/dL (ref 30.0–36.0)
MCV: 88.2 fL (ref 78.0–100.0)
MONO ABS: 0.7 10*3/uL (ref 0.1–1.0)
Monocytes Relative: 7 % (ref 3–12)
Neutro Abs: 7 10*3/uL (ref 1.7–7.7)
Neutrophils Relative %: 64 % (ref 43–77)
Platelets: 316 10*3/uL (ref 150–400)
RBC: 4.91 MIL/uL (ref 3.87–5.11)
RDW: 13.5 % (ref 11.5–15.5)
WBC: 10.9 10*3/uL — ABNORMAL HIGH (ref 4.0–10.5)

## 2014-07-12 LAB — URINALYSIS, ROUTINE W REFLEX MICROSCOPIC
GLUCOSE, UA: NEGATIVE mg/dL
Hgb urine dipstick: NEGATIVE
Ketones, ur: NEGATIVE mg/dL
Nitrite: NEGATIVE
PROTEIN: NEGATIVE mg/dL
Specific Gravity, Urine: 1.027 (ref 1.005–1.030)
UROBILINOGEN UA: 1 mg/dL (ref 0.0–1.0)
pH: 5 (ref 5.0–8.0)

## 2014-07-12 LAB — URINE MICROSCOPIC-ADD ON

## 2014-07-12 LAB — COMPREHENSIVE METABOLIC PANEL
ALT: 20 U/L (ref 0–35)
AST: 29 U/L (ref 0–37)
Albumin: 3.9 g/dL (ref 3.5–5.2)
Alkaline Phosphatase: 110 U/L (ref 39–117)
Anion gap: 15 (ref 5–15)
BUN: 28 mg/dL — ABNORMAL HIGH (ref 6–23)
CALCIUM: 9.4 mg/dL (ref 8.4–10.5)
CO2: 25 meq/L (ref 19–32)
CREATININE: 0.96 mg/dL (ref 0.50–1.10)
Chloride: 102 mEq/L (ref 96–112)
GFR, EST AFRICAN AMERICAN: 70 mL/min — AB (ref >90)
GFR, EST NON AFRICAN AMERICAN: 61 mL/min — AB (ref >90)
Glucose, Bld: 145 mg/dL — ABNORMAL HIGH (ref 70–99)
Potassium: 4.7 mEq/L (ref 3.7–5.3)
Sodium: 142 mEq/L (ref 137–147)
TOTAL PROTEIN: 7.3 g/dL (ref 6.0–8.3)
Total Bilirubin: <0.2 mg/dL — ABNORMAL LOW (ref 0.3–1.2)

## 2014-07-12 LAB — ETHANOL: Alcohol, Ethyl (B): <11 mg/dL (ref 0–11)

## 2014-07-12 LAB — PROTIME-INR
INR: 1.06 (ref 0.00–1.49)
PROTHROMBIN TIME: 14 s (ref 11.6–15.2)

## 2014-07-12 LAB — APTT: APTT: 32 s (ref 24–37)

## 2014-07-12 LAB — I-STAT TROPONIN, ED: TROPONIN I, POC: 0.01 ng/mL (ref 0.00–0.08)

## 2014-07-12 MED ORDER — ASPIRIN 325 MG PO TABS
325.0000 mg | ORAL_TABLET | Freq: Once | ORAL | Status: AC
  Administered 2014-07-12: 325 mg via ORAL
  Filled 2014-07-12: qty 1

## 2014-07-12 MED ORDER — ACETAMINOPHEN 325 MG PO TABS
650.0000 mg | ORAL_TABLET | ORAL | Status: AC
  Administered 2014-07-12: 650 mg via ORAL
  Filled 2014-07-12: qty 2

## 2014-07-12 NOTE — ED Notes (Signed)
Pt in c/o slurred speech and feels like her face was pulled to one side, states she woke up that way yesterday, pt was seen at [place] for same yesterday and dx with a UTI, started antibiotics yesterday. Family and patient concerned that symptoms have continued and are concerned that it is a stroke and not a UTI. Pt with left facial droop noted, c/o bilateral hand numbness and tingling, and generalized weakness to her legs.

## 2014-07-12 NOTE — H&P (Signed)
[facility] History and Physical  STEPHEN TURNBAUGH WNI:[id number] DOB: 10/07/1950 DOA: 07/12/2014  Referring physician: ED physician PCP: [name], MD  Specialists:   Chief Complaint: right side weakness and tingling  HPI: Sonya Small is a 63 y.o. female with PMH of coronary artery disease, s/p of stent placmentment, peripheral vascular disease, former smoker, who presents with right side weakness and tingling.   Patient reports that she started having slurred speech, and right side of weakness and tingling sensations at about 9:00 AM yesterday. She went to [facility] emergency room and had a negative CT-head. She was found to have UTI on urine analysis, but she denies any symptoms for UTI, including dysuria, increased urinary frequency and burning on urination. She was discharged home with oral Keflex for treatment of UTI. She took one pill of keflex. She still has weakness on the right side of her body. She was noticed by family members to have facial deviation to the right side. She also has tingling sensations on the right side of her body.  She has chronic mild shortness of breath due to COPD. She does not have fever, chills, cough chest pain, nausea, vomiting, abdominal pain, diarrhea, rashes.  Her CT head is negative. EKG showed Q-wave in the inferior lead III, which is old. She is admitted to inpatient for further evaluation and treatment. Neurology was consulted.  Review of Systems: As presented in the history of presenting illness, rest negative.  Where does patient live?  Lives with her sister in [place] Can patient participate in ADLs? Yes  Allergy: No Known Allergies  Past Medical History  Diagnosis Date  . CAD (coronary artery disease) 01/2010    a. NSTEMI 11/2008 s/p DES to LCx. b. NSTEMI 01/2010 secondary to thrombotic RCA lesion, med rx.; negative Myoview in January 2014  . PAD (peripheral artery disease)     a. Emboli to R foot 2010 from partially occlusive  lesion in R EIA, s/p stenting. - followed by Dr. Donnetta Hutching  . Anxiety   . Depression   . Hyperlipidemia   . Tobacco abuse   . Overweight(278.02)   . OP (osteoporosis)   . HTN (hypertension)   . Lumbar disc disease   . CHF (congestive heart failure)   . Stroke   . Myocardial infarction     Past Surgical History  Procedure Laterality Date  . Iliac artery stent      RIGHT ILIAC STENT  . Knee arthroscopy    . Lumbar laminectomy    . Tubal ligation    . Hysterectomy -age 39      Social History:  reports that she quit smoking about 19 months ago. She does not have any smokeless tobacco history on file. She reports that she does not drink alcohol or use illicit drugs.  Family History:  Family History  Problem Relation Age of Onset  . Alzheimer's disease Mother   . Cancer Brother      Prior to Admission medications   Medication Sig Start Date End Date Taking? Authorizing Provider  Aclidinium Bromide (TUDORZA PRESSAIR) 400 MCG/ACT AEPB Inhale 400 mcg into the lungs.   Yes Historical Provider, MD  albuterol (PROVENTIL HFA;VENTOLIN HFA) 108 (90 BASE) MCG/ACT inhaler Inhale 2 puffs into the lungs every 4 (four) hours as needed for wheezing or shortness of breath.   Yes Historical Provider, MD  ALPRAZolam Duanne Moron) 0.5 MG tablet Take 0.5 mg by mouth 3 (three) times daily.   Yes Historical Provider, MD  Armodafinil (  NUVIGIL) 200 MG TABS Take 200 mg by mouth daily.   Yes Historical Provider, MD  aspirin (ASPIRIN EC) 81 MG EC tablet Take 81 mg by mouth daily. Swallow whole.   Yes Historical Provider, MD  buPROPion (WELLBUTRIN XL) 300 MG 24 hr tablet Take 300 mg by mouth daily.    Yes Historical Provider, MD  cephALEXin (KEFLEX) 500 MG capsule Take 500 mg by mouth 4 (four) times daily.   Yes Historical Provider, MD  Choline Fenofibrate (TRILIPIX) 135 MG capsule Take 1 capsule (135 mg total) by mouth daily. 06/08/11  Yes [name] [name], MD  clopidogrel (PLAVIX) 75 MG tablet Take 1 tablet (75 mg  total) by mouth daily. 06/08/13  Yes [name] [name], MD  diclofenac sodium (VOLTAREN) 1 % GEL Apply 2 g topically daily as needed (mild pain).   Yes Historical Provider, MD  esomeprazole (NEXIUM) 40 MG capsule Take 40 mg by mouth daily at 12 noon.   Yes Historical Provider, MD  fluticasone (FLONASE) 50 MCG/ACT nasal spray Place 1 spray into both nostrils daily.   Yes Historical Provider, MD  Fluticasone-Salmeterol (ADVAIR) 250-50 MCG/DOSE AEPB Inhale 1 puff into the lungs 2 (two) times daily.   Yes Historical Provider, MD  furosemide (LASIX) 20 MG tablet Take 20 mg by mouth 2 (two) times daily.   Yes Historical Provider, MD  Icosapent Ethyl (VASCEPA) 1 G CAPS Take 1 g by mouth 2 (two) times daily.   Yes Historical Provider, MD  lisinopril (PRINIVIL,ZESTRIL) 10 MG tablet Take 1 tablet (10 mg total) by mouth daily. 10/30/11  Yes [name] [name], MD  meclizine (ANTIVERT) 25 MG tablet Take 25 mg by mouth 3 (three) times daily as needed for dizziness.   Yes Historical Provider, MD  metoprolol succinate (TOPROL-XL) 50 MG 24 hr tablet Take 50 mg by mouth daily. Take with or immediately following a meal.   Yes Historical Provider, MD  oxyCODONE-acetaminophen (PERCOCET) 10-325 MG per tablet Take 1 tablet by mouth every 8 (eight) hours as needed for pain.   Yes Historical Provider, MD  rosuvastatin (CRESTOR) 10 MG tablet Take 10 mg by mouth daily.   Yes Historical Provider, MD  tiZANidine (ZANAFLEX) 4 MG tablet Take 4 mg by mouth every 8 (eight) hours as needed for muscle spasms.   Yes Historical Provider, MD  valACYclovir (VALTREX) 500 MG tablet Take 500 mg by mouth daily as needed (fever blisters).   Yes Historical Provider, MD  VESICARE 5 MG tablet Take 5 mg by mouth daily.  04/29/12  Yes Historical Provider, MD  esomeprazole (NEXIUM) 40 MG capsule Take 1 capsule (40 mg total) by mouth daily before breakfast. 09/14/11 11/26/12  [name] [name], MD  nitroGLYCERIN (NITROSTAT) 0.4 MG SL tablet Place 1 tablet (0.4 mg  total) under the tongue every 5 (five) minutes as needed. 09/26/12   [name] [name], MD    Physical Exam: Filed Vitals:   07/12/14 2315 07/12/14 2330 07/13/14 0000 07/13/14 0025  BP: 109/69 110/60  137/64  Pulse: 64 65  69  Temp:   97.7 F (36.5 C) 97.5 F (36.4 C)  TempSrc:   Oral Oral  Resp: 11 12  16   Height:    5\' 5"  (1.651 m)  Weight:    83.961 kg (185 lb 1.6 oz)  SpO2: 96% 96%  99%   General: Not in acute distress HEENT:       Eyes: PERRL, EOMI, no scleral icterus       ENT:  No discharge from the ears and nose, no pharynx injection, no tonsillar enlargement.        Neck: No JVD, no bruit, no mass felt. Cardiac: S1/S2, RRR, No murmurs, gallops or rubs Pulm: Good air movement bilaterally. Clear to auscultation bilaterally. No rales, wheezing, rhonchi or rubs. Abd: Soft, nondistended, nontender, no rebound pain, no organomegaly, BS present Ext: No edema. 2+DP/PT pulse bilaterally Musculoskeletal: No joint deformities, erythema, or stiffness, ROM full Skin: No rashes.  Neuro: Alert and oriented X3, cranial nerves II-XII grossly intact, muscle strength 5/5 in all extremeties, sensation to light touch slightly decreased in right leg, but not in right arm.  Brachial reflex 2+ bilaterally. Knee reflex 1+ bilaterally. Negative Babinski's sign. Normal finger to nose test. Psych: Patient is not psychotic, no suicidal or hemocidal ideation.  Labs on Admission:  Basic Metabolic Panel:  Recent Labs Lab 07/12/14 1854  NA 142  K 4.7  CL 102  CO2 25  GLUCOSE 145*  BUN 28*  CREATININE 0.96  CALCIUM 9.4   Liver Function Tests:  Recent Labs Lab 07/12/14 1854  AST 29  ALT 20  ALKPHOS 110  BILITOT <0.2*  PROT 7.3  ALBUMIN 3.9   No results found for this basename: LIPASE, AMYLASE,  in the last 168 hours No results found for this basename: AMMONIA,  in the last 168 hours CBC:  Recent Labs Lab 07/12/14 1854  WBC 10.9*  NEUTROABS 7.0  HGB 13.6  HCT 43.3  MCV 88.2  PLT  316   Cardiac Enzymes: No results found for this basename: CKTOTAL, CKMB, CKMBINDEX, TROPONINI,  in the last 168 hours  BNP (last 3 results) No results found for this basename: PROBNP,  in the last 8760 hours CBG: No results found for this basename: GLUCAP,  in the last 168 hours  Radiological Exams on Admission: Ct Head Wo Contrast  07/12/2014   CLINICAL DATA:  Slurred speech, weakness, and facial droop. Patient was known well yesterday.  EXAM: CT HEAD WITHOUT CONTRAST  TECHNIQUE: Contiguous axial images were obtained from the base of the skull through the vertex without intravenous contrast.  COMPARISON:  07/11/2014  FINDINGS: Diffuse low attenuation change throughout the deep white matter bilaterally suggesting small vessel ischemia versus dysmyelinating or demyelinating process. Mild cerebral atrophy. No ventricular dilatation. No mass effect or midline shift. No abnormal extra-axial fluid collections. Gray-white matter junctions are distinct. Basal cisterns are not effaced. No evidence of acute intracranial hemorrhage. No depressed skull fractures. Mucosal thickening and partial opacification of the left maxillary antrum. Concha bullosa in the left middle terminate. Mastoid air cells are not opacified. Vascular calcifications.  IMPRESSION: No acute intracranial abnormalities. Chronic atrophy and small vessel ischemic changes.   Electronically Signed   By: Lucienne Capers M.D.   On: 07/12/2014 21:50    EKG: Independently reviewed.   Assessment/Plan Principal Problem:   Stroke Active Problems:   Coronary artery disease   Non Q wave myocardial infarction   PAD (peripheral artery disease)   Hyperlipidemia   HTN (hypertension)   COPD (chronic obstructive pulmonary disease)   OSA (obstructive sleep apnea)  1. Stroke: Patient's presentation is most likely caused by ischemic stroke. CT head is negative for acute intracranial abnormalities. Patient does not have fever or chills, making  infectious etiology unlikely to diagnosis. Neurology was consulted.  - will admit to tele bed - will follow up Neurology's Recs.  - Obtain MRI/MRA - will hold lasix and lisnopril to allow permissive HTN in the  setting of acute stroke - Check carotid dopplers  - Continue Plavix and add ASA -  fasting lipid panel and HbA1c - 2D transthoracic echocardiography  - Check UDS - PT/OT consult  2. CAD: s/p of MI and stent placment. No chest pain on admission - will continue crestor, Plavix and metoprolol - Aspirin was added.   3. PAD: No acute issues. continue home meds.   4. OSA: CPAP  5. COPD: stable. lung auscultation is clear bilaterally. - continue breathing treatment  6. positive UA: Patient is asymptomatic. -Will hold antibiotics -Repeat a urinalysis and urine   DVT ppx: SQ Heparin   Code Status: Full code Family Communication: None at bed side.  Disposition Plan: Admit to inpatient   Date of Service 07/13/2014    Ivor Costa [facility] Pager [phone]  If 7PM-7AM, please contact night-coverage [email or web address] Password [email or web address] 07/13/2014, 12:29 AM

## 2014-07-12 NOTE — ED Provider Notes (Signed)
CSN: [id number]     Arrival date & time 07/12/14  1835 History   First MD Initiated Contact with Patient 07/12/14 1955     Chief Complaint  Patient presents with  . Stroke Symptoms     (Consider location/radiation/quality/duration/timing/severity/associated sxs/prior Treatment) HPI 63 year old female with history of coronary artery disease, peripheral vascular disease, former smoker, last known well 2 evenings ago woke up yesterday morning with slightly slurred speech with slight numbness tingling to her right face arm and leg with slight weakness to her face arm and leg yesterday with slight persistent numbness to her right arm and leg today with slight persistent right leg weakness with slight change in gait today persistent. She was seen yesterday in the emergency department and discharged with a urinary tract infection. Patient denies any change in baseline chronic shortness of breath. She denies chest pain. She denies change in vision swallowing or understanding. She denies trauma headache or syncope. There is no treatment prior to arrival. Patient is concerned she had a stroke. Past Medical History  Diagnosis Date  . CAD (coronary artery disease) 01/2010    a. NSTEMI 11/2008 s/p DES to LCx. b. NSTEMI 01/2010 secondary to thrombotic RCA lesion, med rx.; negative Myoview in January 2014  . PAD (peripheral artery disease)     a. Emboli to R foot 2010 from partially occlusive lesion in R EIA, s/p stenting. - followed by Dr. Donnetta Hutching  . Anxiety   . Depression   . Hyperlipidemia   . Tobacco abuse   . Overweight(278.02)   . OP (osteoporosis)   . HTN (hypertension)   . Lumbar disc disease   . CHF (congestive heart failure)   . Stroke   . Myocardial infarction   . TIA (transient ischemic attack)   . Sleep apnea   . Shortness of breath   . GERD (gastroesophageal reflux disease)   . Headache    Past Surgical History  Procedure Laterality Date  . Iliac artery stent      RIGHT ILIAC STENT   . Knee arthroscopy    . Lumbar laminectomy    . Tubal ligation    . Hysterectomy -age 57    . Eye surgery      at age 48   Family History  Problem Relation Age of Onset  . Alzheimer's disease Mother   . Cancer Brother    History  Substance Use Topics  . Smoking status: Former Smoker    Quit date: 11/20/2012  . Smokeless tobacco: Never Used  . Alcohol Use: No   OB History    No data available     Review of Systems 10 Systems reviewed and are negative for acute change except as noted in the HPI.   Allergies  Review of patient's allergies indicates no known allergies.  Home Medications   Prior to Admission medications   Medication Sig Start Date End Date Taking? Authorizing Provider  Aclidinium Bromide (TUDORZA PRESSAIR) 400 MCG/ACT AEPB Inhale 400 mcg into the lungs.   Yes Historical Provider, MD  albuterol (PROVENTIL HFA;VENTOLIN HFA) 108 (90 BASE) MCG/ACT inhaler Inhale 2 puffs into the lungs every 4 (four) hours as needed for wheezing or shortness of breath.   Yes Historical Provider, MD  ALPRAZolam Duanne Moron) 0.5 MG tablet Take 0.5 mg by mouth 3 (three) times daily.   Yes Historical Provider, MD  Armodafinil (NUVIGIL) 200 MG TABS Take 200 mg by mouth daily.   Yes Historical Provider, MD  aspirin (ASPIRIN EC) 81 MG  EC tablet Take 81 mg by mouth daily. Swallow whole.   Yes Historical Provider, MD  buPROPion (WELLBUTRIN XL) 300 MG 24 hr tablet Take 300 mg by mouth daily.    Yes Historical Provider, MD  Choline Fenofibrate (TRILIPIX) 135 MG capsule Take 1 capsule (135 mg total) by mouth daily. 06/08/11  Yes [name] [name], MD  clopidogrel (PLAVIX) 75 MG tablet Take 1 tablet (75 mg total) by mouth daily. 06/08/13  Yes [name] [name], MD  diclofenac sodium (VOLTAREN) 1 % GEL Apply 2 g topically daily as needed (mild pain).   Yes Historical Provider, MD  esomeprazole (NEXIUM) 40 MG capsule Take 40 mg by mouth daily at 12 noon.   Yes Historical Provider, MD  fluticasone (FLONASE)  50 MCG/ACT nasal spray Place 1 spray into both nostrils daily.   Yes Historical Provider, MD  Fluticasone-Salmeterol (ADVAIR) 250-50 MCG/DOSE AEPB Inhale 1 puff into the lungs 2 (two) times daily.   Yes Historical Provider, MD  furosemide (LASIX) 20 MG tablet Take 20 mg by mouth 2 (two) times daily.   Yes Historical Provider, MD  Icosapent Ethyl (VASCEPA) 1 G CAPS Take 1 g by mouth 2 (two) times daily.   Yes Historical Provider, MD  lisinopril (PRINIVIL,ZESTRIL) 10 MG tablet Take 1 tablet (10 mg total) by mouth daily. 10/30/11  Yes [name] [name], MD  meclizine (ANTIVERT) 25 MG tablet Take 25 mg by mouth 3 (three) times daily as needed for dizziness.   Yes Historical Provider, MD  metoprolol succinate (TOPROL-XL) 50 MG 24 hr tablet Take 50 mg by mouth daily. Take with or immediately following a meal.   Yes Historical Provider, MD  oxyCODONE-acetaminophen (PERCOCET) 10-325 MG per tablet Take 1 tablet by mouth every 8 (eight) hours as needed for pain.   Yes Historical Provider, MD  tiZANidine (ZANAFLEX) 4 MG tablet Take 4 mg by mouth every 8 (eight) hours as needed for muscle spasms.   Yes Historical Provider, MD  valACYclovir (VALTREX) 500 MG tablet Take 500 mg by mouth daily as needed (fever blisters).   Yes Historical Provider, MD  VESICARE 5 MG tablet Take 5 mg by mouth daily.  04/29/12  Yes Historical Provider, MD  metoprolol (LOPRESSOR) 50 MG tablet Take 1 tablet by mouth daily. 05/17/14   Historical Provider, MD  nitroGLYCERIN (NITROSTAT) 0.4 MG SL tablet Place 1 tablet (0.4 mg total) under the tongue every 5 (five) minutes as needed. 09/26/12   [name] [name], MD  rosuvastatin (CRESTOR) 20 MG tablet Take 1 tablet (20 mg total) by mouth daily. 07/13/14   Annita Brod, MD   BP 143/68 mmHg  Pulse 75  Temp(Src) 97.7 F (36.5 C) (Oral)  Resp 18  Ht 5\' 5"  (1.651 m)  Wt 184 lb 15.5 oz (83.9 kg)  BMI 30.78 kg/m2  SpO2 98% Physical Exam  Nursing note and vitals reviewed. Constitutional:  Awake,  alert, nontoxic appearance with baseline speech for patient.  HENT:  Head: Atraumatic.  Mouth/Throat: No oropharyngeal exudate.  Eyes: EOM are normal. Pupils are equal, round, and reactive to light. Right eye exhibits no discharge. Left eye exhibits no discharge.  Neck: Neck supple.  Cardiovascular: Normal rate and regular rhythm.   No murmur heard. Pulmonary/Chest: Effort normal and breath sounds normal. No stridor. No respiratory distress. She has no wheezes. She has no rales. She exhibits no tenderness.  Abdominal: Soft. Bowel sounds are normal. She exhibits no mass. There is no tenderness. There is no rebound.  Musculoskeletal: She exhibits no tenderness.  Baseline ROM, moves extremities with no obvious new focal weakness.  Lymphadenopathy:    She has no cervical adenopathy.  Neurological: She is alert.  Awake, alert, cooperative and aware of situation; motor strength 5 out of 5 both arms 4+ out of 5 right leg 5 out of 5 left leg; sensation normal to light touch face with slight decreased light touch right arm and right leg; peripheral visual fields full to confrontation; no facial asymmetry; tongue midline; major cranial nerves appear intact; no pronator drift, normal finger to nose bilaterally, gait slightly abnormal seemingly due to mild right leg weakness without ataxia; slight dysarthria present  Skin: No rash noted.  Psychiatric: She has a normal mood and affect.    ED Course  Procedures (including critical care time) D/w Neurohosp so Pt added to list for consults; paged Unassigned Med for Obs. 2215 Labs Review Labs Reviewed  CBC WITH DIFFERENTIAL - Abnormal; Notable for the following:    WBC 10.9 (*)    All other components within normal limits  COMPREHENSIVE METABOLIC PANEL - Abnormal; Notable for the following:    Glucose, Bld 145 (*)    BUN 28 (*)    Total Bilirubin <0.2 (*)    GFR calc non Af Amer 61 (*)    GFR calc Af Amer 70 (*)    All other components within normal  limits  URINALYSIS, ROUTINE W REFLEX MICROSCOPIC - Abnormal; Notable for the following:    APPearance CLOUDY (*)    Bilirubin Urine SMALL (*)    Leukocytes, UA SMALL (*)    All other components within normal limits  URINE RAPID DRUG SCREEN (HOSP PERFORMED) - Abnormal; Notable for the following:    Benzodiazepines POSITIVE (*)    All other components within normal limits  URINE MICROSCOPIC-ADD ON - Abnormal; Notable for the following:    Squamous Epithelial / LPF FEW (*)    Bacteria, UA FEW (*)    All other components within normal limits  HEMOGLOBIN A1C - Abnormal; Notable for the following:    Hemoglobin A1C 6.1 (*)    Mean Plasma Glucose 128 (*)    All other components within normal limits  LIPID PANEL - Abnormal; Notable for the following:    Triglycerides 212 (*)    HDL 36 (*)    VLDL 42 (*)    LDL Cholesterol 101 (*)    All other components within normal limits  URINE CULTURE  ETHANOL  PROTIME-INR  APTT  TSH  I-STAT TROPOININ, ED    Imaging Review No results found.   EKG Interpretation   Date/Time:  Monday July 12 2014 20:26:09 EDT Ventricular Rate:  71 PR Interval:  160 QRS Duration: 93 QT Interval:  403 QTC Calculation: 438 R Axis:   36 Text Interpretation:  Sinus rhythm Abnormal inferior Q waves Probable  anteroseptal infarct, old ED PHYSICIAN INTERPRETATION AVAILABLE IN [place]  [place] Confirmed by TEST, Record ([id number]) on 07/14/2014 7:52:32 AM      MDM   Final diagnoses:  Stroke    Patient / Family / Caregiver informed of clinical course, understand medical decision-making process, and agree with plan. The patient appears reasonably stabilized for admission considering the current resources, flow, and capabilities available in the ED at this time, and I doubt any other [facility] requiring further screening and/or treatment in the ED prior to admission.    Babette Relic, MD 07/25/14 [phone]

## 2014-07-12 NOTE — ED Notes (Addendum)
Pt reports being seen at [facility] for slurred speech, weakness and facial droop.  Pt was cleared from [place] and pts symptoms did not improve. Pt reports last known well yesterday. Pt feels weaker than baseline with bilateral arm pain.

## 2014-07-12 NOTE — ED Notes (Signed)
Enid Derry, sister, [phone]

## 2014-07-13 ENCOUNTER — Encounter (HOSPITAL_COMMUNITY): Payer: Self-pay | Admitting: General Practice

## 2014-07-13 ENCOUNTER — Observation Stay (HOSPITAL_COMMUNITY): Payer: Medicare Other

## 2014-07-13 DIAGNOSIS — I639 Cerebral infarction, unspecified: Secondary | ICD-10-CM

## 2014-07-13 DIAGNOSIS — G4733 Obstructive sleep apnea (adult) (pediatric): Secondary | ICD-10-CM | POA: Diagnosis present

## 2014-07-13 DIAGNOSIS — Z6830 Body mass index (BMI) 30.0-30.9, adult: Secondary | ICD-10-CM | POA: Diagnosis not present

## 2014-07-13 DIAGNOSIS — Z7982 Long term (current) use of aspirin: Secondary | ICD-10-CM | POA: Diagnosis not present

## 2014-07-13 DIAGNOSIS — Z87891 Personal history of nicotine dependence: Secondary | ICD-10-CM | POA: Diagnosis not present

## 2014-07-13 DIAGNOSIS — I359 Nonrheumatic aortic valve disorder, unspecified: Secondary | ICD-10-CM

## 2014-07-13 DIAGNOSIS — R202 Paresthesia of skin: Secondary | ICD-10-CM | POA: Diagnosis present

## 2014-07-13 DIAGNOSIS — M810 Age-related osteoporosis without current pathological fracture: Secondary | ICD-10-CM | POA: Diagnosis present

## 2014-07-13 DIAGNOSIS — I5032 Chronic diastolic (congestive) heart failure: Secondary | ICD-10-CM | POA: Diagnosis present

## 2014-07-13 DIAGNOSIS — J449 Chronic obstructive pulmonary disease, unspecified: Secondary | ICD-10-CM | POA: Diagnosis present

## 2014-07-13 DIAGNOSIS — N390 Urinary tract infection, site not specified: Secondary | ICD-10-CM | POA: Diagnosis present

## 2014-07-13 DIAGNOSIS — E785 Hyperlipidemia, unspecified: Secondary | ICD-10-CM

## 2014-07-13 DIAGNOSIS — I10 Essential (primary) hypertension: Secondary | ICD-10-CM

## 2014-07-13 DIAGNOSIS — I739 Peripheral vascular disease, unspecified: Secondary | ICD-10-CM | POA: Diagnosis present

## 2014-07-13 DIAGNOSIS — Z955 Presence of coronary angioplasty implant and graft: Secondary | ICD-10-CM | POA: Diagnosis not present

## 2014-07-13 DIAGNOSIS — R4781 Slurred speech: Secondary | ICD-10-CM | POA: Diagnosis present

## 2014-07-13 DIAGNOSIS — F418 Other specified anxiety disorders: Secondary | ICD-10-CM | POA: Diagnosis present

## 2014-07-13 DIAGNOSIS — I252 Old myocardial infarction: Secondary | ICD-10-CM | POA: Diagnosis not present

## 2014-07-13 DIAGNOSIS — I2510 Atherosclerotic heart disease of native coronary artery without angina pectoris: Secondary | ICD-10-CM | POA: Diagnosis present

## 2014-07-13 DIAGNOSIS — R29810 Facial weakness: Secondary | ICD-10-CM | POA: Diagnosis present

## 2014-07-13 DIAGNOSIS — F329 Major depressive disorder, single episode, unspecified: Secondary | ICD-10-CM | POA: Diagnosis present

## 2014-07-13 DIAGNOSIS — Z79899 Other long term (current) drug therapy: Secondary | ICD-10-CM | POA: Diagnosis not present

## 2014-07-13 DIAGNOSIS — E669 Obesity, unspecified: Secondary | ICD-10-CM | POA: Diagnosis present

## 2014-07-13 DIAGNOSIS — I63511 Cerebral infarction due to unspecified occlusion or stenosis of right middle cerebral artery: Secondary | ICD-10-CM | POA: Diagnosis present

## 2014-07-13 LAB — RAPID URINE DRUG SCREEN, HOSP PERFORMED
Amphetamines: NOT DETECTED
Barbiturates: NOT DETECTED
Benzodiazepines: POSITIVE — AB
Cocaine: NOT DETECTED
Opiates: NOT DETECTED
Tetrahydrocannabinol: NOT DETECTED

## 2014-07-13 LAB — LIPID PANEL
CHOL/HDL RATIO: 5 ratio
CHOLESTEROL: 179 mg/dL (ref 0–200)
HDL: 36 mg/dL — AB (ref >39)
LDL Cholesterol: 101 mg/dL — ABNORMAL HIGH (ref 0–99)
Triglycerides: 212 mg/dL — ABNORMAL HIGH (ref <150)
VLDL: 42 mg/dL — AB (ref 0–40)

## 2014-07-13 LAB — TSH: TSH: 1.13 u[IU]/mL (ref 0.350–4.500)

## 2014-07-13 LAB — HEMOGLOBIN A1C
Hgb A1c MFr Bld: 6.1 % — ABNORMAL HIGH (ref <5.7)
Mean Plasma Glucose: 128 mg/dL — ABNORMAL HIGH (ref <117)

## 2014-07-13 MED ORDER — METOPROLOL SUCCINATE ER 50 MG PO TB24
50.0000 mg | ORAL_TABLET | Freq: Every day | ORAL | Status: DC
  Administered 2014-07-13: 50 mg via ORAL
  Filled 2014-07-13: qty 1

## 2014-07-13 MED ORDER — SENNOSIDES-DOCUSATE SODIUM 8.6-50 MG PO TABS: 1.0000 | ORAL_TABLET | Freq: Every evening | ORAL | Status: DC | PRN

## 2014-07-13 MED ORDER — OXYCODONE-ACETAMINOPHEN 10-325 MG PO TABS: 1.0000 | ORAL_TABLET | Freq: Three times a day (TID) | ORAL | Status: DC | PRN

## 2014-07-13 MED ORDER — OXYCODONE-ACETAMINOPHEN 5-325 MG PO TABS
1.0000 | ORAL_TABLET | Freq: Three times a day (TID) | ORAL | Status: DC | PRN
  Administered 2014-07-13: 1 via ORAL
  Filled 2014-07-13: qty 1

## 2014-07-13 MED ORDER — TIZANIDINE HCL 4 MG PO TABS
4.0000 mg | ORAL_TABLET | Freq: Three times a day (TID) | ORAL | Status: DC | PRN
  Filled 2014-07-13: qty 1

## 2014-07-13 MED ORDER — ALBUTEROL SULFATE (2.5 MG/3ML) 0.083% IN NEBU: 3.0000 mL | INHALATION_SOLUTION | RESPIRATORY_TRACT | Status: DC | PRN

## 2014-07-13 MED ORDER — CLOPIDOGREL BISULFATE 75 MG PO TABS
75.0000 mg | ORAL_TABLET | Freq: Every day | ORAL | Status: DC
Start: 2014-07-13 — End: 2014-07-13
  Administered 2014-07-13: 75 mg via ORAL
  Filled 2014-07-13: qty 1

## 2014-07-13 MED ORDER — MODAFINIL 100 MG PO TABS: 200.0000 mg | ORAL_TABLET | Freq: Every day | ORAL | Status: DC

## 2014-07-13 MED ORDER — HEPARIN SODIUM (PORCINE) 5000 UNIT/ML IJ SOLN
5000.0000 [IU] | Freq: Three times a day (TID) | INTRAMUSCULAR | Status: DC
  Filled 2014-07-13: qty 1

## 2014-07-13 MED ORDER — FENOFIBRATE 160 MG PO TABS
160.0000 mg | ORAL_TABLET | Freq: Every day | ORAL | Status: DC
  Administered 2014-07-13: 160 mg via ORAL
  Filled 2014-07-13: qty 1

## 2014-07-13 MED ORDER — ARMODAFINIL 200 MG PO TABS: 200.0000 mg | ORAL_TABLET | Freq: Every day | ORAL | Status: DC

## 2014-07-13 MED ORDER — ALPRAZOLAM 0.5 MG PO TABS: 0.5000 mg | ORAL_TABLET | Freq: Three times a day (TID) | ORAL | Status: DC

## 2014-07-13 MED ORDER — NITROGLYCERIN 0.4 MG SL SUBL: 0.4000 mg | SUBLINGUAL_TABLET | SUBLINGUAL | Status: DC | PRN

## 2014-07-13 MED ORDER — MODAFINIL 200 MG PO TABS: 200.0000 mg | ORAL_TABLET | Freq: Every day | ORAL | Status: DC

## 2014-07-13 MED ORDER — ALPRAZOLAM 0.5 MG PO TABS
0.5000 mg | ORAL_TABLET | Freq: Three times a day (TID) | ORAL | Status: DC
  Administered 2014-07-13 (×3): 0.5 mg via ORAL
  Filled 2014-07-13 (×3): qty 1

## 2014-07-13 MED ORDER — TIOTROPIUM BROMIDE MONOHYDRATE 18 MCG IN CAPS
18.0000 ug | ORAL_CAPSULE | Freq: Every day | RESPIRATORY_TRACT | Status: DC
  Administered 2014-07-13: 18 ug via RESPIRATORY_TRACT
  Filled 2014-07-13: qty 5

## 2014-07-13 MED ORDER — MOMETASONE FURO-FORMOTEROL FUM 100-5 MCG/ACT IN AERO
2.0000 | INHALATION_SPRAY | Freq: Two times a day (BID) | RESPIRATORY_TRACT | Status: DC
Start: 2014-07-13 — End: 2014-07-13
  Administered 2014-07-13: 2 via RESPIRATORY_TRACT
  Filled 2014-07-13: qty 8.8

## 2014-07-13 MED ORDER — DARIFENACIN HYDROBROMIDE ER 7.5 MG PO TB24
7.5000 mg | ORAL_TABLET | Freq: Every day | ORAL | Status: DC
  Filled 2014-07-13 (×2): qty 1

## 2014-07-13 MED ORDER — ICOSAPENT ETHYL 1 G PO CAPS: 1.0000 g | ORAL_CAPSULE | Freq: Two times a day (BID) | ORAL | Status: DC

## 2014-07-13 MED ORDER — OMEGA-3-ACID ETHYL ESTERS 1 G PO CAPS
1.0000 g | ORAL_CAPSULE | Freq: Two times a day (BID) | ORAL | Status: DC
  Administered 2014-07-13: 1 g via ORAL
  Filled 2014-07-13: qty 1

## 2014-07-13 MED ORDER — INFLUENZA VAC SPLIT QUAD 0.5 ML IM SUSY
0.5000 mL | PREFILLED_SYRINGE | Freq: Once | INTRAMUSCULAR | Status: AC
  Administered 2014-07-13: 0.5 mL via INTRAMUSCULAR
  Filled 2014-07-13: qty 0.5

## 2014-07-13 MED ORDER — FLUTICASONE PROPIONATE 50 MCG/ACT NA SUSP
1.0000 | Freq: Every day | NASAL | Status: DC
  Filled 2014-07-13 (×2): qty 16

## 2014-07-13 MED ORDER — ROSUVASTATIN CALCIUM 10 MG PO TABS
10.0000 mg | ORAL_TABLET | Freq: Every day | ORAL | Status: DC
  Administered 2014-07-13: 10 mg via ORAL
  Filled 2014-07-13 (×2): qty 1

## 2014-07-13 MED ORDER — OXYCODONE HCL 5 MG PO TABS: 5.0000 mg | ORAL_TABLET | Freq: Three times a day (TID) | ORAL | Status: DC | PRN

## 2014-07-13 MED ORDER — PANTOPRAZOLE SODIUM 40 MG PO TBEC
40.0000 mg | DELAYED_RELEASE_TABLET | Freq: Every day | ORAL | Status: DC
  Administered 2014-07-13: 40 mg via ORAL
  Filled 2014-07-13: qty 1

## 2014-07-13 MED ORDER — ASPIRIN EC 81 MG PO TBEC
81.0000 mg | DELAYED_RELEASE_TABLET | Freq: Every day | ORAL | Status: DC
Start: 2014-07-13 — End: 2014-07-13
  Administered 2014-07-13: 81 mg via ORAL
  Filled 2014-07-13 (×2): qty 1

## 2014-07-13 MED ORDER — DICLOFENAC SODIUM 1 % TD GEL
2.0000 g | Freq: Every day | TRANSDERMAL | Status: DC | PRN
  Filled 2014-07-13: qty 100

## 2014-07-13 MED ORDER — ROSUVASTATIN CALCIUM 20 MG PO TABS: 20.0000 mg | ORAL_TABLET | Freq: Every day | ORAL | Status: DC

## 2014-07-13 MED ORDER — STROKE: EARLY STAGES OF RECOVERY BOOK
Freq: Once | Status: AC
  Administered 2014-07-13: 06:00:00
  Filled 2014-07-13: qty 1

## 2014-07-13 MED ORDER — BUPROPION HCL ER (XL) 150 MG PO TB24
300.0000 mg | ORAL_TABLET | Freq: Every day | ORAL | Status: DC
  Administered 2014-07-13: 300 mg via ORAL
  Filled 2014-07-13: qty 2

## 2014-07-13 MED ORDER — INFLUENZA VAC SPLIT QUAD 0.5 ML IM SUSY: 0.5000 mL | PREFILLED_SYRINGE | INTRAMUSCULAR | Status: DC

## 2014-07-13 NOTE — Progress Notes (Signed)
*  PRELIMINARY RESULTS* Vascular Ultrasound Carotid Duplex (Doppler) has been completed.  Preliminary findings: Bilateral:  1-39% ICA stenosis.  Vertebral artery flow is antegrade.      Landry Mellow, RDMS, RVT  07/13/2014, 2:39 PM

## 2014-07-13 NOTE — Care Management Note (Unsigned)
    Page 1 of 1   07/13/2014     3:19:47 PM CARE MANAGEMENT NOTE 07/13/2014  Patient:  Sonya Small, Sonya Small   Account Number:  [id number]  Date Initiated:  07/13/2014  Documentation initiated by:  GRAVES-BIGELOW,Taneasha Fuqua  Subjective/Objective Assessment:   Pt admitted for Facial droop and right-sided weakness and numbness. MRI positive for stroke.     Action/Plan:   CM will continue to monitor for disposition needs.   Anticipated DC Date:  07/15/2014   Anticipated DC Plan:  [place]         Choice offered to / List presented to:             Status of service:  In process, will continue to follow Medicare Important Message given?   (If response is "NO", the following Medicare IM given date fields will be blank) Date Medicare IM given:   Medicare IM given by:   Date Additional Medicare IM given:   Additional Medicare IM given by:    Discharge Disposition:    Per UR Regulation:  Reviewed for med. necessity/level of care/duration of stay  If discussed at [place] of Stay Meetings, dates discussed:    Comments:

## 2014-07-13 NOTE — Discharge Instructions (Signed)
STROKE/TIA DISCHARGE INSTRUCTIONS SMOKING Cigarette smoking nearly doubles your risk of having a stroke & is the single most alterable risk factor  If you smoke or have smoked in the last 12 months, you are advised to quit smoking for your health.  Most of the excess cardiovascular risk related to smoking disappears within a year of stopping.  Ask you doctor about anti-smoking medications  [place] Quit Line: 1-800-QUIT NOW  Free Smoking Cessation Classes [phone]  CHOLESTEROL Know your levels; limit fat & cholesterol in your diet  Lipid Panel     Component Value Date/Time   CHOL 179 07/13/2014 0108   TRIG 212* 07/13/2014 0108   HDL 36* 07/13/2014 0108   CHOLHDL 5.0 07/13/2014 0108   VLDL 42* 07/13/2014 0108   LDLCALC 101* 07/13/2014 0108      Many patients benefit from treatment even if their cholesterol is at goal.  Goal: Total Cholesterol (CHOL) less than 160  Goal:  Triglycerides (TRIG) less than 150  Goal:  HDL greater than 40  Goal:  LDL (LDLCALC) less than 100   BLOOD PRESSURE American Stroke Association blood pressure target is less that 120/80 mm/Hg  Your discharge blood pressure is:  BP: 143/68 mmHg  Monitor your blood pressure  Limit your salt and alcohol intake  Many individuals will require more than one medication for high blood pressure  DIABETES (A1c is a blood sugar average for last 3 months) Goal HGBA1c is under 7% (HBGA1c is blood sugar average for last 3 months)  Diabetes: No known diagnosis of diabetes    Lab Results  Component Value Date   HGBA1C 6.1* 07/13/2014     Your HGBA1c can be lowered with medications, healthy diet, and exercise.  Check your blood sugar as directed by your physician  Call your physician if you experience unexplained or low blood sugars.  PHYSICAL ACTIVITY/REHABILITATION Goal is 30 minutes at least 4 days per week  Activity: Increase activity slowly, Therapies:  Return to work:   Activity decreases your risk of  heart attack and stroke and makes your heart stronger.  It helps control your weight and blood pressure; helps you relax and can improve your mood.  Participate in a regular exercise program.  Talk with your doctor about the best form of exercise for you (dancing, walking, swimming, cycling).  DIET/WEIGHT Goal is to maintain a healthy weight  Your discharge diet is: Cardiac  liquids Your height is:  Height: 5\' 5"  (165.1 cm) Your current weight is: Weight: 83.9 kg (184 lb 15.5 oz) Your Body Mass Index (BMI) is:  BMI (Calculated): 30.9  Following the type of diet specifically designed for you will help prevent another stroke.  Your goal weight range is:    Your goal Body Mass Index (BMI) is 19-24.  Healthy food habits can help reduce 3 risk factors for stroke:  High cholesterol, hypertension, and excess weight.  RESOURCES Stroke/Support Group:  Call [phone]   STROKE EDUCATION PROVIDED/REVIEWED AND GIVEN TO PATIENT Stroke warning signs and symptoms How to activate emergency medical system (call 911). Medications prescribed at discharge. Need for follow-up after discharge. Personal risk factors for stroke. Pneumonia vaccine given: No Flu vaccine given: Yes, Date 07/13/2014 My questions have been answered, the writing is legible, and I understand these instructions.  I will adhere to these goals & educational materials that have been provided to me after my discharge from the hospital.     Dyslipidemia Dyslipidemia is an imbalance of the lipids in  your blood. Lipids are waxy, fat-like proteins that your body needs in small amounts. Dyslipidemia often involves the lipids cholesterol or triglycerides. Common forms of dyslipidemia are:  High levels of bad cholesterol (LDL cholesterol). LDL cholesterol is the type of cholesterol that causes heart disease.  Low levels of good cholesterol (HDL cholesterol). HDL cholesterol is the type of cholesterol that helps protect against heart  disease.  High levels of triglycerides. Triglycerides are a fatty substance in the blood linked to a buildup of plaque on your arteries. RISK FACTORS  Increased age.  Having a family history of high cholesterol.  Certain medicines, including birth control pills, diuretics, beta-blockers, and some medicines for depression.  Smoking.  Eating a high-fat diet.  Being overweight.  Medical conditions such as diabetes, polycystic ovary syndrome, pregnancy, kidney disease, and hypothyroidism.  Lack of regular exercise. SIGNS AND SYMPTOMS There are no signs or symptoms with dyslipidemia.  DIAGNOSIS  A simple blood test called a fasting blood test can be done to determine your level of:  Total cholesterol. This is the combined number of LDL cholesterol and HDL cholesterol. A healthy number is lower than 200.  LDL cholesterol. The goal number for LDL cholesterol is different for each person depending on risk factors. Ask your health care provider what your LDL cholesterol number should be.  HDL cholesterol. A healthy level of HDL cholesterol is 60 or higher. A number lower than 40 for men or 50 for women is a danger sign.  Triglycerides. A healthy triglyceride number is less than 150. TREATMENT  Dyslipidemia is a treatable condition. Your health care provider will advise you on what type of treatment is best based on your age, your test results, and current guidelines. Treatment may include:   Dietary changes. A dietitian can help you create a meal plan. You may need to:  Eat more foods that contain omega-3s, such as salmon and other fish.  Replace saturated fats and trans fats in your diet with healthy fats such as nuts, seeds, avocados, olive oil, and canola oil.  Regular exercise. This can help lower your LDL cholesterol, raise your HDL cholesterol, and help with weight management. Check with your health care provider before beginning an exercise program. Most people should  participate in 30 minutes of brisk exercise 5 days a week.  Quitting smoking.  Medicines to lower LDL cholesterol and triglycerides. Your health care provider will monitor your lipid levels with regular blood tests. HOME CARE INSTRUCTIONS  Eat a healthy diet. Follow any diet instructions if they were given to you by your health care provider.  Maintain a healthy weight.  Exercise regularly based on the recommendations of your health care provider.  Do not use any tobacco products, including cigarettes, chewing tobacco, or electronic cigarettes.  Take medicines only as directed by your health care provider.  Keep all follow-up visits as directed by your health care provider. SEEK MEDICAL CARE IF: You are having possible side effects from your medicines. Document Released: 09/15/2013 Document Revised: 01/25/2014 Document Reviewed: 09/15/2013 [facility] Patient Information 2015 [place], [place]. This information is not intended to replace advice given to you by your health care provider. Make sure you discuss any questions you have with your health care provider.  Hypertension Hypertension is another name for high blood pressure. High blood pressure forces your heart to work harder to pump blood. A blood pressure reading has two numbers, which includes a higher number over a lower number (example: 110/72). HOME CARE  Have your blood pressure rechecked by your doctor.  Only take medicine as told by your doctor. Follow the directions carefully. The medicine does not work as well if you skip doses. Skipping doses also puts you at risk for problems.  Do not smoke.  Monitor your blood pressure at home as told by your doctor. GET HELP IF:  You think you are having a reaction to the medicine you are taking.  You have repeat headaches or feel dizzy.  You have puffiness (swelling) in your ankles.  You have trouble with your vision. GET HELP RIGHT AWAY IF:   You get a very bad headache  and are confused.  You feel weak, numb, or faint.  You get chest or belly (abdominal) pain.  You throw up (vomit).  You cannot breathe very well. MAKE SURE YOU:   Understand these instructions.  Will watch your condition.  Will get help right away if you are not doing well or get worse. Document Released: 02/27/2008 Document Revised: 09/15/2013 Document Reviewed: 07/03/2013 [facility] Patient Information 2015 [place], [place]. This information is not intended to replace advice given to you by your health care provider. Make sure you discuss any questions you have with your health care provider.  Ischemic Stroke Blood carries oxygen to all areas of your body. A stroke happens when your blood does not flow to your brain like normal. If this happens, your brain will not get the oxygen it needs and brain tissue will die. This is an emergency. Problems (symptoms) of a stroke usually happen suddenly. You may notice them when you wake up. They can include:  Loss of feeling or weakness on one side of the body (face, arm, leg).  Feeling confused.  Trouble talking or understanding.  Trouble seeing.  Trouble walking.  Feeling dizzy.  Loss of balance or coordination.  Severe headache without a cause.  Trouble reading or writing. Get help as soon as any of these problems first start. This is important.  RISK FACTORS  Risk factors are things that make it more likely for you to have a stroke. These things include:  High blood pressure (hypertension).  High cholesterol.  Diabetes.  Heart disease.  Having a buildup of fatty deposits in the blood vessels.  Having an abnormal heart rhythm (atrial fibrillation).  Being very overweight (obese).  Smoking.  Taking birth control pills, especially if you smoke.  Not being active.  Having a diet high in fats, salt, and calories.  Drinking too much alcohol.  Using illegal drugs.  Being African American.  Being over the age  of 9.  Having a family history of stroke.  Having a history of blood clots, stroke, warning stroke (transient ischemic attack, TIA), or heart attack.  Sickle cell disease. HOME CARE  Take all medicines exactly as told by your doctor. Understand all your medicine instructions.  You may need to take a medicine to thin your blood, like aspirin or warfarin. Take warfarin exactly as told.  Taking too much or too little warfarin is dangerous. Get regular blood tests as told, including the PT and INR tests. The test results help your doctor adjust your dose of warfarin. Your PT and INR levels must be done as often as told by your doctor.  Food can cause problems with warfarin and affect the results of your blood tests. This is true for foods high in vitamin K, such as spinach, kale, broccoli, cabbage, collard and turnip greens, Brussels sprouts, peas, cauliflower, seaweed, and  parsley, as well as beef and pork liver, green tea, and soybean oil. Eat the same amount of food high in vitamin K. Avoid major changes in your diet. Tell your doctor before changing your diet. Talk to a food specialist (dietitian) if you have questions.  Many medicines can cause problems with warfarin and affect your PT and INR test results. Tell your doctor about all medicines you take. This includes vitamins and dietary pills (supplements). Be careful with aspirin and medicines that relieve redness, soreness, and puffiness (inflammation). Do not take or stop medicines unless your doctor tells you to.  Warfarin can cause a lot of bruising or bleeding. Hold pressure over cuts for longer than normal. Talk to your doctor about other side effects of warfarin.  Avoid sports or activities that may cause injury or bleeding.  Be careful when you shave, floss your teeth, or use sharp objects.  Avoid alcoholic drinks or drink very little alcohol while taking warfarin. Tell your doctor if you change how much alcohol you  drink.  Tell your dentist and other doctors that you take warfarin before procedures.  If you are able to swallow, eat healthy foods. Eat 5 or more servings of fruits and vegetables a day. Eat soft foods, pureed foods, or eat small bites of food so you do not choke.  Follow your diet program as told, if you are given one.  Keep a healthy weight.  Stay active. Try to get at least 30 minutes of activity on most or all days.  Do not smoke.  Limit how much alcohol you drink even if you are not taking warfarin. Moderate alcohol use is:  No more than 2 drinks each day for men.  No more than 1 drink each day for women who are not pregnant.  Keep your home safe so you do not fall. Try:  Putting grab bars in the bedroom and bathroom.  Raising toilet seats.  Putting a seat in the shower.  Go to therapy sessions (physical, occupational, and speech) as told by your doctor.  Use a walker or cane at all times if told to do so.  Keep all doctor visits as told. GET HELP IF:  Your personality changes.  You have trouble swallowing.  You are seeing two of everything.  You are dizzy.  You have a fever.  Your skin starts to break down. GET HELP RIGHT AWAY IF:  The symptoms below may be a sign of an emergency. Do not wait to see if the symptoms go away. Call for help (911 in [place] Do not drive yourself to the hospital.  You have sudden weakness or numbness on the face, arm, or leg (especially on one side of the body).  You have sudden trouble walking or moving your arms or legs.  You have sudden confusion.  You have trouble talking or understanding.  You have sudden trouble seeing in one or both eyes.  You lose your balance or your movements are not smooth.  You have a sudden, severe headache with no known cause.  You have new chest pain or you feel your heart beating in an unsteady way.  You are partly or totally unaware of what is going on around you. Document Released:  08/30/2011 Document Revised: 01/25/2014 Document Reviewed: 04/20/2012 [facility] Patient Information 2015 [place], [place]. This information is not intended to replace advice given to you by your health care provider. Make sure you discuss any questions you have with your health care provider.  Smoking Cessation Quitting smoking is important to your health and has many advantages. However, it is not always easy to quit since nicotine is a very addictive drug. Oftentimes, people try 3 times or more before being able to quit. This document explains the best ways for you to prepare to quit smoking. Quitting takes hard work and a lot of effort, but you can do it. ADVANTAGES OF QUITTING SMOKING  You will live longer, feel better, and live better.  Your body will feel the impact of quitting smoking almost immediately.  Within 20 minutes, blood pressure decreases. Your pulse returns to its normal level.  After 8 hours, carbon monoxide levels in the blood return to normal. Your oxygen level increases.  After 24 hours, the chance of having a heart attack starts to decrease. Your breath, hair, and body stop smelling like smoke.  After 48 hours, damaged nerve endings begin to recover. Your sense of taste and smell improve.  After 72 hours, the body is virtually free of nicotine. Your bronchial tubes relax and breathing becomes easier.  After 2 to 12 weeks, lungs can hold more air. Exercise becomes easier and circulation improves.  The risk of having a heart attack, stroke, cancer, or lung disease is greatly reduced.  After 1 year, the risk of coronary heart disease is cut in half.  After 5 years, the risk of stroke falls to the same as a nonsmoker.  After 10 years, the risk of lung cancer is cut in half and the risk of other cancers decreases significantly.  After 15 years, the risk of coronary heart disease drops, usually to the level of a nonsmoker.  If you are pregnant, quitting smoking will  improve your chances of having a healthy baby.  The people you live with, especially any children, will be healthier.  You will have extra money to spend on things other than cigarettes. QUESTIONS TO THINK ABOUT BEFORE ATTEMPTING TO QUIT You may want to talk about your answers with your health care provider.  Why do you want to quit?  If you tried to quit in the past, what helped and what did not?  What will be the most difficult situations for you after you quit? How will you plan to handle them?  Who can help you through the tough times? Your family? Friends? A health care provider?  What pleasures do you get from smoking? What ways can you still get pleasure if you quit? Here are some questions to ask your health care provider:  How can you help me to be successful at quitting?  What medicine do you think would be best for me and how should I take it?  What should I do if I need more help?  What is smoking withdrawal like? How can I get information on withdrawal? GET READY  Set a quit date.  Change your environment by getting rid of all cigarettes, ashtrays, matches, and lighters in your home, car, or work. Do not let people smoke in your home.  Review your past attempts to quit. Think about what worked and what did not. GET SUPPORT AND ENCOURAGEMENT You have a better chance of being successful if you have help. You can get support in many ways.  Tell your family, friends, and coworkers that you are going to quit and need their support. Ask them not to smoke around you.  Get individual, group, or telephone counseling and support. Programs are available at [employer] and health centers. Call  your local health department for information about programs in your area.  Spiritual beliefs and practices may help some smokers quit.  Download a "quit meter" on your computer to keep track of quit statistics, such as how long you have gone without smoking, cigarettes not smoked,  and money saved.  Get a self-help book about quitting smoking and staying off tobacco. Kountze yourself from urges to smoke. Talk to someone, go for a walk, or occupy your time with a task.  Change your normal routine. Take a different route to work. Drink tea instead of coffee. Eat breakfast in a different place.  Reduce your stress. Take a hot bath, exercise, or read a book.  Plan something enjoyable to do every day. Reward yourself for not smoking.  Explore interactive web-based programs that specialize in helping you quit. GET MEDICINE AND USE IT CORRECTLY Medicines can help you stop smoking and decrease the urge to smoke. Combining medicine with the above behavioral methods and support can greatly increase your chances of successfully quitting smoking.  Nicotine replacement therapy helps deliver nicotine to your body without the negative effects and risks of smoking. Nicotine replacement therapy includes nicotine gum, lozenges, inhalers, nasal sprays, and skin patches. Some may be available over-the-counter and others require a prescription.  Antidepressant medicine helps people abstain from smoking, but how this works is unknown. This medicine is available by prescription.  Nicotinic receptor partial agonist medicine simulates the effect of nicotine in your brain. This medicine is available by prescription. Ask your health care provider for advice about which medicines to use and how to use them based on your health history. Your health care provider will tell you what side effects to look out for if you choose to be on a medicine or therapy. Carefully read the information on the package. Do not use any other product containing nicotine while using a nicotine replacement product.  RELAPSE OR DIFFICULT SITUATIONS Most relapses occur within the first 3 months after quitting. Do not be discouraged if you start smoking again. Remember, most people try  several times before finally quitting. You may have symptoms of withdrawal because your body is used to nicotine. You may crave cigarettes, be irritable, feel very hungry, cough often, get headaches, or have difficulty concentrating. The withdrawal symptoms are only temporary. They are strongest when you first quit, but they will go away within 10-14 days. To reduce the chances of relapse, try to:  Avoid drinking alcohol. Drinking lowers your chances of successfully quitting.  Reduce the amount of caffeine you consume. Once you quit smoking, the amount of caffeine in your body increases and can give you symptoms, such as a rapid heartbeat, sweating, and anxiety.  Avoid smokers because they can make you want to smoke.  Do not let weight gain distract you. Many smokers will gain weight when they quit, usually less than 10 pounds. Eat a healthy diet and stay active. You can always lose the weight gained after you quit.  Find ways to improve your mood other than smoking. FOR MORE INFORMATION  www.smokefree.gov  Document Released: 09/04/2001 Document Revised: 01/25/2014 Document Reviewed: 12/20/2011 [facility] Patient Information 2015 [place], [place]. This information is not intended to replace advice given to you by your health care provider. Make sure you discuss any questions you have with your health care provider.

## 2014-07-13 NOTE — Progress Notes (Signed)
STROKE TEAM PROGRESS NOTE   HISTORY Sonya BRIDGERS is an 63 y.o. female history coronary artery disease, peripheral artery disease, hypertension and hyperlipidemia, who presented to the [facility] with a complaint of numbness and weakness involving right side as well as facial asymmetry. Patient was last known well at 10 PM on 07/10/2014. There's no previous history of stroke or TIA. She's been on aspirin and Plavix daily. CT scan of the head showed no acute intracranial abnormality. NIH stroke score was 4. Patient was not administered TPA secondary to  Beyond time under for treatment consideration. She was admitted  for further evaluation and treatment.   SUBJECTIVE (INTERVAL HISTORY) Her sister and niece along with the pts nurse are at the bedside.  Overall she feels her condition is stable. She is not worse from yesterday. Patient reports she takes a lot of [employer].   OBJECTIVE Temp:  [97.5 F (36.4 C)-98.4 F (36.9 C)] 98.4 F (36.9 C) (10/20 1015) Pulse Rate:  [63-80] 77 (10/20 1015) Cardiac Rhythm:  [-] Normal sinus rhythm (10/20 0816) Resp:  [11-20] 12 (10/20 1015) BP: (109-138)/(46-75) 125/64 mmHg (10/20 1015) SpO2:  [95 %-100 %] 97 % (10/20 1015) Weight:  [83.9 kg (184 lb 15.5 oz)-83.961 kg (185 lb 1.6 oz)] 83.9 kg (184 lb 15.5 oz) (10/20 0400)   Recent Labs Lab 07/12/14 1854  NA 142  K 4.7  CL 102  CO2 25  GLUCOSE 145*  BUN 28*  CREATININE 0.96  CALCIUM 9.4    Recent Labs Lab 07/12/14 1854  AST 29  ALT 20  ALKPHOS 110  BILITOT <0.2*  PROT 7.3  ALBUMIN 3.9    Recent Labs Lab 07/12/14 1854  WBC 10.9*  NEUTROABS 7.0  HGB 13.6  HCT 43.3  MCV 88.2  PLT 316   No results found for this basename: CKTOTAL, CKMB, CKMBINDEX, TROPONINI,  in the last 168 hours  Recent Labs  07/12/14 2028  LABPROT 14.0  INR 1.06    Recent Labs  07/12/14 1947  COLORURINE YELLOW  LABSPEC 1.027  PHURINE 5.0  GLUCOSEU NEGATIVE  HGBUR NEGATIVE  BILIRUBINUR SMALL*   KETONESUR NEGATIVE  PROTEINUR NEGATIVE  UROBILINOGEN 1.0  NITRITE NEGATIVE  LEUKOCYTESUR SMALL*       Component Value Date/Time   CHOL 179 07/13/2014 0108   TRIG 212* 07/13/2014 0108   HDL 36* 07/13/2014 0108   CHOLHDL 5.0 07/13/2014 0108   VLDL 42* 07/13/2014 0108   LDLCALC 101* 07/13/2014 0108   Lab Results  Component Value Date   HGBA1C 6.1* 07/13/2014      Component Value Date/Time   LABOPIA NONE DETECTED 07/12/2014 1947   COCAINSCRNUR NONE DETECTED 07/12/2014 1947   LABBENZ POSITIVE* 07/12/2014 1947   AMPHETMU NONE DETECTED 07/12/2014 1947   THCU NONE DETECTED 07/12/2014 1947   LABBARB NONE DETECTED 07/12/2014 1947     Recent Labs Lab 07/12/14 2028  ETH <11    Ct Head Wo Contrast 07/12/2014    No acute intracranial abnormalities. Chronic atrophy and small vessel ischemic changes.    Mr Brain Wo Contrast 07/13/2014    Acute RIGHT MCA lenticulostriate territory infarct affecting the basal ganglia and periventricular white matter.  Extensive chronic microvascular ischemic change, likely hypertensive related.  Mr Jodene Nam Head Wo Contrast 07/13/2014    No proximal flow reducing lesion on MRA.     Carotid Doppler  There is 1-39% bilateral ICA stenosis. Vertebral artery flow is antegrade.     PHYSICAL EXAM Pleasant middle aged caucasian  lady not in distress.Awake alert. Afebrile. Head is nontraumatic. Neck is supple without bruit. Hearing is normal. Cardiac exam no murmur or gallop. Lungs are clear to auscultation. Distal pulses are well felt. Neurological Exam ;  Awake  Alert oriented x 3. Normal speech and language.eye movements full without nystagmus.fundi were not visualized. Vision acuity and fields appear normal. Hearing is normal. Palatal movements are normal. Face symmetric. Tongue midline. Normal strength, tone, reflexes and coordination. Normal sensation. Gait deferred. ASSESSMENT/PLAN Sonya Small is a 63 y.o. female with history of coronary artery  disease, peripheral artery disease, hypertension and hyperlipidemia presenting with complaint of numbness and weakness involving right side as well as facial asymmetry. She did not receive IV t-PA due to delay in arrival.   Stroke - Left brain infarct not seen on MRI, associated with presenting symptoms - incidental Non-dominant right  MCA lenticulostriate territory infarct secondary to small vessel disease source     MRI  R MCA lenticulostriate infarct  MRA  Unremarkable   Carotid Doppler  No significant stenosis   2D Echo  pending   HgbA1c 6.1  Heparin 5000 units sq tid for VTE prophylaxis  Cardiac thin liquids  Activity as tolerated.  aspirin 81 mg orally every day and clopidogrel 75 mg orally every day prior to admission, now on aspirin 81 mg orally every day and clopidogrel 75 mg orally every day. Given cardiac issues, recommend ongoing aspirin and Plavix use. Patient has been advised to decrease Goody Powder intake.  Patient counseled to be compliant with her antithrombotic medications  Risk factor education  Ongoing aggressive risk factor management  Resultant right hemiparesis and sensory deficit  Therapy recommendations:  pending   Disposition:  Anticipated return home  Hypertension  Stable  Permissive hypertension (OK if < 220/120) but gradually normalize in 5-7 days  Patient counseled to be compliant with her blood pressure medications  Hyperlipidemia  Home meds:  Crestor 10 mg resumed in hospital  LDL 101 goal < 70  Continue statin at discharge  Other Stroke Risk Factors Advanced age Former Cigarette smoker, stopped just over 1 year ago   Obesity, Body mass index is 30.78 kg/(m^2).    Coronary artery disease - MI, stent 2010 PAD - stent in leg  Other Active Problems  tightness in the chest, being addressed by primary care MD per pt  Other Pertinent History  Anxiety/depression  CHF  Hospital day # 1  [facility] BIBY, MSN, RN, ANVP-BC,  ANP-BC, [facility] Pager: [phone] 07/13/2014 10:37 AM  I have personally examined this patient, reviewed notes, independently viewed imaging studies, participated in medical decision making and plan of care. I have made any additions or clarifications directly to the above note. Agree with note above. She has symptoms for left brin infarct which is not visualized on MRI but has a silent right subcortical infarct from small vessel disease. She needs to remain on dual antiplatelet therapy given her cardiac and peripheral artery stents  Antony Contras, MD Medical Director [facility] Pager: [phone] 07/13/2014 8:23 PM    To contact Stroke Continuity provider, please refer to [email or web address] After hours, contact General Neurology

## 2014-07-13 NOTE — Progress Notes (Signed)
UR Completed Shawni Volkov Graves-Bigelow, RN,BSN [phone]  

## 2014-07-13 NOTE — Consult Note (Signed)
Referring Physician: Mora Bellman    Chief Complaint: Facial droop and right-sided weakness and numbness.  HPI: Sonya Small is an 63 y.o. female history coronary artery disease, peripheral artery disease, hypertension and hyperlipidemia, who presented to the [facility] with a complaint of numbness and weakness involving right side as well as facial asymmetry. Patient was last known well at 10 PM on 07/10/2014. There's no previous history of stroke or TIA. She's been on aspirin and Plavix daily. CT scan of the head showed no acute intracranial abnormality. NIH stroke score was 4.  LSN: 10 PM on 07/10/2014 tPA Given: No: Beyond time under for treatment consideration mRankin:  Past Medical History  Diagnosis Date  . CAD (coronary artery disease) 01/2010    a. NSTEMI 11/2008 s/p DES to LCx. b. NSTEMI 01/2010 secondary to thrombotic RCA lesion, med rx.; negative Myoview in January 2014  . PAD (peripheral artery disease)     a. Emboli to R foot 2010 from partially occlusive lesion in R EIA, s/p stenting. - followed by Dr. Donnetta Hutching  . Anxiety   . Depression   . Hyperlipidemia   . Tobacco abuse   . Overweight(278.02)   . OP (osteoporosis)   . HTN (hypertension)   . Lumbar disc disease   . CHF (congestive heart failure)   . Stroke   . Myocardial infarction     Family History  Problem Relation Age of Onset  . Alzheimer's disease Mother   . Cancer Brother      Medications: I have reviewed the patient's current medications.  ROS: History obtained from chart review and the patient  General ROS: negative for - chills, fatigue, fever, night sweats, weight gain or weight loss Psychological ROS: negative for - behavioral disorder, hallucinations, memory difficulties, mood swings or suicidal ideation Ophthalmic ROS: negative for - blurry vision, double vision, eye pain or loss of vision ENT ROS: negative for - epistaxis, nasal discharge, oral lesions, sore throat, tinnitus or vertigo Allergy and Immunology  ROS: negative for - hives or itchy/watery eyes Hematological and Lymphatic ROS: negative for - bleeding problems, bruising or swollen lymph nodes Endocrine ROS: negative for - galactorrhea, hair pattern changes, polydipsia/polyuria or temperature intolerance Respiratory ROS: negative for - cough, hemoptysis, shortness of breath or wheezing Cardiovascular ROS: negative for - chest pain, dyspnea on exertion, edema or irregular heartbeat Gastrointestinal ROS: negative for - abdominal pain, diarrhea, hematemesis, nausea/vomiting or stool incontinence Genito-Urinary ROS: negative for - dysuria, hematuria, incontinence or urinary frequency/urgency Musculoskeletal ROS: negative for - joint swelling or muscular weakness Neurological ROS: as noted in HPI Dermatological ROS: negative for rash and skin lesion changes  Physical Examination: Blood pressure 137/64, pulse 69, temperature 97.5 F (36.4 C), temperature source Oral, resp. rate 16, height 5\' 5"  (1.651 m), weight 83.961 kg (185 lb 1.6 oz), SpO2 99.00%.  Neurologic Examination: Mental Status: Alert, oriented, thought content appropriate.  Speech fluent without evidence of aphasia. Able to follow commands without difficulty. Cranial Nerves: II-Visual fields were normal. III/IV/VI-Pupils were equal and reacted. Extraocular movements were full and conjugate.    V/VII-slightly reduced perception of tactile sensation of the right side of the face; slight facial asymmetry suggestive of left lower facial weakness. VIII-normal. X-normal speech and symmetrical palatal movement. Motor: Slight drift of right upper and lower extremities. Motor exam is otherwise unremarkable. Sensory: Reduced perception of tactile sensation over left extremities compared to right extremities. Deep Tendon Reflexes: 2+ and symmetric. Plantars: Flexor bilaterally Cerebellar: Slightly reduced coordination of right  upper cavity compared to left.  Ct Head Wo  Contrast  07/12/2014   CLINICAL DATA:  Slurred speech, weakness, and facial droop. Patient was known well yesterday.  EXAM: CT HEAD WITHOUT CONTRAST  TECHNIQUE: Contiguous axial images were obtained from the base of the skull through the vertex without intravenous contrast.  COMPARISON:  07/11/2014  FINDINGS: Diffuse low attenuation change throughout the deep white matter bilaterally suggesting small vessel ischemia versus dysmyelinating or demyelinating process. Mild cerebral atrophy. No ventricular dilatation. No mass effect or midline shift. No abnormal extra-axial fluid collections. Gray-white matter junctions are distinct. Basal cisterns are not effaced. No evidence of acute intracranial hemorrhage. No depressed skull fractures. Mucosal thickening and partial opacification of the left maxillary antrum. Concha bullosa in the left middle terminate. Mastoid air cells are not opacified. Vascular calcifications.  IMPRESSION: No acute intracranial abnormalities. Chronic atrophy and small vessel ischemic changes.   Electronically Signed   By: Lucienne Capers M.D.   On: 07/12/2014 21:50    Assessment: 63 y.o. female with multiple risk factors for stroke presenting with probable acute subcortical small vessel right CVA.  Stroke Risk Factors - hyperlipidemia, hypertension and smoking  Plan: 1. HgbA1c, fasting lipid panel 2. MRI, MRA  of the brain without contrast 3. PT consult, OT consult, Speech consult 4. Echocardiogram 5. Carotid dopplers 6. Prophylactic therapy-aspirin 81 mg per day and Plavix 75 mg per day 7. Risk factor modification 8. Telemetry monitoring   C.R. Nicole Kindred, MD [facility] [phone]  07/13/2014, 12:50 AM

## 2014-07-13 NOTE — Discharge Summary (Signed)
Discharge Summary  Sonya Small:[id number] DOB: Jul 22, 1951  PCP: [name], MD  Admit date: 07/12/2014 Discharge date: 07/13/2014  Time spent: 35 minutes  Recommendations for Outpatient Follow-up:  1. Patient will follow up with neurology in 3 months 2. Medication change: Patient's Crestor been increased from 10 mg to 20 mg Patient advised to stop taking Goody powders and she needs to be on aspirin and Plavix daily for heart healthy and stroke prevention.  Discharge Diagnoses:  Active Hospital Problems   Diagnosis Date Noted  . Stroke 07/12/2014  . COPD (chronic obstructive pulmonary disease) 07/13/2014  . OSA (obstructive sleep apnea) 07/13/2014  . HTN (hypertension) 07/12/2014  . Coronary artery disease   . Hyperlipidemia   . PAD (peripheral artery disease)   . History of myocardial infarction 01/22/2010    Resolved Hospital Problems   Diagnosis Date Noted Date Resolved  No resolved problems to display.    Discharge Condition: Improved, being discharged home  Diet recommendation: Heart healthy  Filed Weights   07/13/14 0025 07/13/14 0400  Weight: 83.961 kg (185 lb 1.6 oz) 83.9 kg (184 lb 15.5 oz)    History of present illness:  63 year old female with past medical history of CAD status post stent placement on aspirin and Plavix who presented on 10/19 the evening with complaints of slurred speech and right-sided weakness and tingling sensation starting earlier in the day. Initial CT scan of the head was done which was unremarkable. Urinalysis noted a UTI patient said on Keflex. Patient was also noted to have some facial droop after she was returned home and so they brought her to the emergency room at [facility]. CT scan unremarkable, but with symptoms persisting, highly suspicious for CVA. Patient brought into the hospitalist service.  Hospital Course:  Principal Problem: Acute CVA: MRI confirmed acute right MCA infarct affecting basal ganglia and periventricular  white matter. Patient underwent stroke workup. Neurology consulted. Carotid Dopplers done which were unremarkable. Echocardiogram noting grade 1 diastolic dysfunction*.  Lipid panel done noting LDL of 101 despite being on Crestor. A1c normal at 6.1. Had discussion with family and patient is supposed on aspirin and Plavix daily, although at times she stops her aspirin and takes [place] powders instead for chronic headaches. Overall her symptoms are mild and I felt that she did not need any physical therapy needs. Spoke with neurology and their recommendation is for better control of her lipids continued adherence to aspirin Plavix and to follow up with her in 3 months. Patient does not smoke anymore. Active Problems:   Coronary artery disease   History of myocardial infarction: Stable continued on aspirin and Plavix    PAD (peripheral artery disease): As above   Hyperlipidemia: As above, Crestor increased   HTN (hypertension): As above, antihypertensives continued   COPD (chronic obstructive pulmonary disease)   OSA (obstructive sleep apnea)   Chronic diastolic heart failure: Patient already on ACE inhibitor and Lasix. Concerned about beta blocker given COPD. Incidentally noted on echocardiogram. No evidence of acute heart failure. Patient given educational information.  Procedures:  Echocardiogram done 63/78: Grade 1 diastolic dysfunction  Carotid Dopplers done 10/20: Preliminary report notes no evidence of stenoses  Consultations:  Neurology  Discharge Exam: BP 143/68  Pulse 75  Temp(Src) 97.7 F (36.5 C) (Oral)  Resp 18  Ht 5\' 5"  (1.651 m)  Wt 83.9 kg (184 lb 15.5 oz)  BMI 30.78 kg/m2  SpO2 98%  General: Alert and oriented x3, no acute distress Cardiovascular: Regular rate  and rhythm, S1-S2 Respiratory: Clear to auscultation bilaterally Neuro: No deficits appreciated  Discharge Instructions You were cared for by a hospitalist during your hospital stay. If you have any questions  about your discharge medications or the care you received while you were in the hospital after you are discharged, you can call the unit and asked to speak with the hospitalist on call if the hospitalist that took care of you is not available. Once you are discharged, your primary care physician will handle any further medical issues. Please note that NO REFILLS for any discharge medications will be authorized once you are discharged, as it is imperative that you return to your primary care physician (or establish a relationship with a primary care physician if you do not have one) for your aftercare needs so that they can reassess your need for medications and monitor your lab values.     Medication List         albuterol 108 (90 BASE) MCG/ACT inhaler  Commonly known as:  PROVENTIL HFA;VENTOLIN HFA  Inhale 2 puffs into the lungs every 4 (four) hours as needed for wheezing or shortness of breath.     ALPRAZolam 0.5 MG tablet  Commonly known as:  XANAX  Take 0.5 mg by mouth 3 (three) times daily.     aspirin EC 81 MG EC tablet  Generic drug:  aspirin  Take 81 mg by mouth daily. Swallow whole.     buPROPion 300 MG 24 hr tablet  Commonly known as:  WELLBUTRIN XL  Take 300 mg by mouth daily.     cephALEXin 500 MG capsule  Commonly known as:  KEFLEX  Take 500 mg by mouth 4 (four) times daily.     Choline Fenofibrate 135 MG capsule  Commonly known as:  TRILIPIX  Take 1 capsule (135 mg total) by mouth daily.     clopidogrel 75 MG tablet  Commonly known as:  PLAVIX  Take 1 tablet (75 mg total) by mouth daily.     diclofenac sodium 1 % Gel  Commonly known as:  VOLTAREN  Apply 2 g topically daily as needed (mild pain).     esomeprazole 40 MG capsule  Commonly known as:  NEXIUM  Take 1 capsule (40 mg total) by mouth daily before breakfast.     esomeprazole 40 MG capsule  Commonly known as:  NEXIUM  Take 40 mg by mouth daily at 12 noon.     fluticasone 50 MCG/ACT nasal spray    Commonly known as:  FLONASE  Place 1 spray into both nostrils daily.     Fluticasone-Salmeterol 250-50 MCG/DOSE Aepb  Commonly known as:  ADVAIR  Inhale 1 puff into the lungs 2 (two) times daily.     furosemide 20 MG tablet  Commonly known as:  LASIX  Take 20 mg by mouth 2 (two) times daily.     lisinopril 10 MG tablet  Commonly known as:  PRINIVIL,ZESTRIL  Take 1 tablet (10 mg total) by mouth daily.     meclizine 25 MG tablet  Commonly known as:  ANTIVERT  Take 25 mg by mouth 3 (three) times daily as needed for dizziness.     metoprolol succinate 50 MG 24 hr tablet  Commonly known as:  TOPROL-XL  Take 50 mg by mouth daily. Take with or immediately following a meal.     nitroGLYCERIN 0.4 MG SL tablet  Commonly known as:  NITROSTAT  Place 1 tablet (0.4 mg total) under the tongue  every 5 (five) minutes as needed.     NUVIGIL 200 MG Tabs  Generic drug:  Armodafinil  Take 200 mg by mouth daily.     oxyCODONE-acetaminophen 10-325 MG per tablet  Commonly known as:  PERCOCET  Take 1 tablet by mouth every 8 (eight) hours as needed for pain.     rosuvastatin 20 MG tablet  Commonly known as:  CRESTOR  Take 1 tablet (20 mg total) by mouth daily.     tiZANidine 4 MG tablet  Commonly known as:  ZANAFLEX  Take 4 mg by mouth every 8 (eight) hours as needed for muscle spasms.     TUDORZA PRESSAIR 400 MCG/ACT Aepb  Generic drug:  Aclidinium Bromide  Inhale 400 mcg into the lungs.     valACYclovir 500 MG tablet  Commonly known as:  VALTREX  Take 500 mg by mouth daily as needed (fever blisters).     VASCEPA 1 G Caps  Generic drug:  Icosapent Ethyl  Take 1 g by mouth 2 (two) times daily.     VESICARE 5 MG tablet  Generic drug:  solifenacin  Take 5 mg by mouth daily.       No Known Allergies    The results of significant diagnostics from this hospitalization (including imaging, microbiology, ancillary and laboratory) are listed below for reference.    Significant  Diagnostic Studies: Ct Head Wo Contrast  07/12/2014   CLINICAL DATA:  Slurred speech, weakness, and facial droop. Patient was known well yesterday.  EXAM: CT HEAD WITHOUT CONTRAST  TECHNIQUE: Contiguous axial images were obtained from the base of the skull through the vertex without intravenous contrast.  COMPARISON:  07/11/2014  FINDINGS: Diffuse low attenuation change throughout the deep white matter bilaterally suggesting small vessel ischemia versus dysmyelinating or demyelinating process. Mild cerebral atrophy. No ventricular dilatation. No mass effect or midline shift. No abnormal extra-axial fluid collections. Gray-white matter junctions are distinct. Basal cisterns are not effaced. No evidence of acute intracranial hemorrhage. No depressed skull fractures. Mucosal thickening and partial opacification of the left maxillary antrum. Concha bullosa in the left middle terminate. Mastoid air cells are not opacified. Vascular calcifications.  IMPRESSION: No acute intracranial abnormalities. Chronic atrophy and small vessel ischemic changes.   Electronically Signed   By: Lucienne Capers M.D.   On: 07/12/2014 21:50   Mr Jodene Nam Head Wo Contrast  07/13/2014   CLINICAL DATA:  LEFT facial droop. LEFT hemisensory deficit. Patient last known well at 07/10/2014 10 p.m. Stroke risk factors include hypertension and hyperlipidemia.  EXAM: MRI HEAD WITHOUT CONTRAST  MRA HEAD WITHOUT CONTRAST  TECHNIQUE: Multiplanar, multiecho pulse sequences of the brain and surrounding structures were obtained without intravenous contrast. Angiographic images of the head were obtained using MRA technique without contrast.  COMPARISON:  CT head 07/11/2014 and 07/12/2014.  MR head 10/29/2012.  FINDINGS: MRI HEAD FINDINGS  There is an acute infarct involving the RIGHT MCA lenticulostriate territory, affecting the posterior aspect of the basal ganglia and periventricular deep white matter. No other areas of acute infarction are seen.  There  is no acute hemorrhage, mass lesion, hydrocephalus, or extra-axial fluid.  Slight premature for age cerebral and cerebellar atrophy. Extensive T2 and FLAIR hyperintensities throughout the periventricular and subcortical white matter, also affecting the brainstem, associated with prominent perivascular spaces, likely hypertensive related cerebral vascular disease. No microbleeds. Remote LEFT cerebellar infarct. Scattered remote lacunar infarcts. Flow voids are maintained, with RIGHT vertebral dominant. No midline abnormalities. Extracranial soft tissues unremarkable. Mild  chronic disease. No osseous lesions. Compared with prior CTs, the infarct is not visible.  MRA HEAD FINDINGS  Internal carotid arteries are widely patent. Basilar artery is widely patent, somewhat hypoplastic due to fetal LEFT PCA. RIGHT vertebral is the sole contributor to the basilar. LEFT vertebral ends in PICA. There is no proximal middle cerebral artery stenosis on the RIGHT or LEFT. No MCA branch occlusion.  Minor irregularity of the distal RIGHT anterior cerebral artery A1 segment is redemonstrated and stable. Moderate irregularity of the proximal P1 posterior cerebral artery segment is redemonstrated and stable. No cerebellar branch occlusion. No intracranial aneurysm. Similar appearance to priors.  IMPRESSION: Acute RIGHT MCA lenticulostriate territory infarct affecting the basal ganglia and periventricular white matter.  Extensive chronic microvascular ischemic change, likely hypertensive related.  No proximal flow reducing lesion on MRA.   Electronically Signed   By: Rolla Flatten M.D.   On: 07/13/2014 07:48   Mr Brain Wo Contrast  07/13/2014   CLINICAL DATA:  LEFT facial droop. LEFT hemisensory deficit. Patient last known well at 07/10/2014 10 p.m. Stroke risk factors include hypertension and hyperlipidemia.  EXAM: MRI HEAD WITHOUT CONTRAST  MRA HEAD WITHOUT CONTRAST  TECHNIQUE: Multiplanar, multiecho pulse sequences of the brain and  surrounding structures were obtained without intravenous contrast. Angiographic images of the head were obtained using MRA technique without contrast.  COMPARISON:  CT head 07/11/2014 and 07/12/2014.  MR head 10/29/2012.  FINDINGS: MRI HEAD FINDINGS  There is an acute infarct involving the RIGHT MCA lenticulostriate territory, affecting the posterior aspect of the basal ganglia and periventricular deep white matter. No other areas of acute infarction are seen.  There is no acute hemorrhage, mass lesion, hydrocephalus, or extra-axial fluid.  Slight premature for age cerebral and cerebellar atrophy. Extensive T2 and FLAIR hyperintensities throughout the periventricular and subcortical white matter, also affecting the brainstem, associated with prominent perivascular spaces, likely hypertensive related cerebral vascular disease. No microbleeds. Remote LEFT cerebellar infarct. Scattered remote lacunar infarcts. Flow voids are maintained, with RIGHT vertebral dominant. No midline abnormalities. Extracranial soft tissues unremarkable. Mild chronic disease. No osseous lesions. Compared with prior CTs, the infarct is not visible.  MRA HEAD FINDINGS  Internal carotid arteries are widely patent. Basilar artery is widely patent, somewhat hypoplastic due to fetal LEFT PCA. RIGHT vertebral is the sole contributor to the basilar. LEFT vertebral ends in PICA. There is no proximal middle cerebral artery stenosis on the RIGHT or LEFT. No MCA branch occlusion.  Minor irregularity of the distal RIGHT anterior cerebral artery A1 segment is redemonstrated and stable. Moderate irregularity of the proximal P1 posterior cerebral artery segment is redemonstrated and stable. No cerebellar branch occlusion. No intracranial aneurysm. Similar appearance to priors.  IMPRESSION: Acute RIGHT MCA lenticulostriate territory infarct affecting the basal ganglia and periventricular white matter.  Extensive chronic microvascular ischemic change, likely  hypertensive related.  No proximal flow reducing lesion on MRA.   Electronically Signed   By: Rolla Flatten M.D.   On: 07/13/2014 07:48    Microbiology: No results found for this or any previous visit (from the past 240 hour(s)).   Labs: Basic Metabolic Panel:  Recent Labs Lab 07/12/14 1854  NA 142  K 4.7  CL 102  CO2 25  GLUCOSE 145*  BUN 28*  CREATININE 0.96  CALCIUM 9.4   Liver Function Tests:  Recent Labs Lab 07/12/14 1854  AST 29  ALT 20  ALKPHOS 110  BILITOT <0.2*  PROT 7.3  ALBUMIN 3.9  No results found for this basename: LIPASE, AMYLASE,  in the last 168 hours No results found for this basename: AMMONIA,  in the last 168 hours CBC:  Recent Labs Lab 07/12/14 1854  WBC 10.9*  NEUTROABS 7.0  HGB 13.6  HCT 43.3  MCV 88.2  PLT 316   Cardiac Enzymes: No results found for this basename: CKTOTAL, CKMB, CKMBINDEX, TROPONINI,  in the last 168 hours BNP: BNP (last 3 results) No results found for this basename: PROBNP,  in the last 8760 hours CBG: No results found for this basename: GLUCAP,  in the last 168 hours     Signed:  Annita Brod  [facility] 07/13/2014, 4:39 PM

## 2014-07-13 NOTE — Progress Notes (Signed)
Echo Lab  2D Echocardiogram completed.  Taver[place], RDCS 07/13/2014 2:58 PM

## 2014-07-14 ENCOUNTER — Encounter: Payer: Self-pay | Admitting: Cardiology

## 2014-07-14 ENCOUNTER — Ambulatory Visit (INDEPENDENT_AMBULATORY_CARE_PROVIDER_SITE_OTHER): Payer: Medicare Other | Admitting: Cardiology

## 2014-07-14 ENCOUNTER — Other Ambulatory Visit: Payer: Self-pay | Admitting: *Deleted

## 2014-07-14 VITALS — BP 110/52 | HR 66 | Ht 65.0 in | Wt 184.4 lb

## 2014-07-14 DIAGNOSIS — I739 Peripheral vascular disease, unspecified: Secondary | ICD-10-CM

## 2014-07-14 DIAGNOSIS — I639 Cerebral infarction, unspecified: Secondary | ICD-10-CM

## 2014-07-14 DIAGNOSIS — I25119 Atherosclerotic heart disease of native coronary artery with unspecified angina pectoris: Secondary | ICD-10-CM

## 2014-07-14 DIAGNOSIS — E785 Hyperlipidemia, unspecified: Secondary | ICD-10-CM

## 2014-07-14 DIAGNOSIS — I10 Essential (primary) hypertension: Secondary | ICD-10-CM

## 2014-07-14 DIAGNOSIS — Z23 Encounter for immunization: Secondary | ICD-10-CM

## 2014-07-14 DIAGNOSIS — I209 Angina pectoris, unspecified: Secondary | ICD-10-CM

## 2014-07-14 LAB — URINE CULTURE
COLONY COUNT: NO GROWTH
Culture: NO GROWTH

## 2014-07-14 NOTE — Progress Notes (Signed)
Sonya Small Date of Birth: [birth year] Medical Record #[id number]  History of Present Illness: Sonya Small is seen back today for a post hospital visit. Last seen in Feb 2014. Sonya Small has known CAD with prior NSTEMI back in 2011, PAD s/p iliac stent, COPD and  tobacco abuse.   In January 2014 Sonya Small was admitted with recurrent chest pain. Cardiac enzymes were negative. Myoview was negative. Sonya Small had a CT of the abdomen and pelvis which showed no stones.   More recently Sonya Small was admitted with acute CVA. Admitted 10/19-10/20, 2015. Sonya Small presented with slurred speech, drawing of her mouth and emotional lability. MRI showed an acute right MCA CVA involving the basal ganglia and periventricular white matter. Sonya Small was started on ASA 81 mg daily along with Plavix. Crestor dose was increased to 20 mg. Echo was unremarkable. Carotids were patent by MRI. Carotid doppler report pending. Today Sonya Small is feeling better. Still has some "drawing" but other symptoms resolved.  Sonya Small has multiple chronic symptoms. Sometimes her chest feels tight and her arms hurt. Sonya Small has some SOB. Sonya Small did quit smoking in 2/14. Occasional sweats. Sonya Small also has pain in bilateral buttocks running down both legs. Worse with walking. Last seen by Dr. Donnetta Hutching in May 2014 and dopplers looked good.   Current Outpatient Prescriptions on File Prior to Visit  Medication Sig Dispense Refill  . Aclidinium Bromide (TUDORZA PRESSAIR) 400 MCG/ACT AEPB Inhale 400 mcg into the lungs.      [name] albuterol (PROVENTIL HFA;VENTOLIN HFA) 108 (90 BASE) MCG/ACT inhaler Inhale 2 puffs into the lungs every 4 (four) hours as needed for wheezing or shortness of breath.      . ALPRAZolam (XANAX) 0.5 MG tablet Take 0.5 mg by mouth 3 (three) times daily.      . Armodafinil (NUVIGIL) 200 MG TABS Take 200 mg by mouth daily.      [name] aspirin (ASPIRIN EC) 81 MG EC tablet Take 81 mg by mouth daily. Swallow whole.      [name] buPROPion (WELLBUTRIN XL) 300 MG 24 hr tablet Take 300 mg by mouth  daily.       . Choline Fenofibrate (TRILIPIX) 135 MG capsule Take 1 capsule (135 mg total) by mouth daily.  30 capsule  5  . clopidogrel (PLAVIX) 75 MG tablet Take 1 tablet (75 mg total) by mouth daily.  90 tablet  0  . diclofenac sodium (VOLTAREN) 1 % GEL Apply 2 g topically daily as needed (mild pain).      [name] esomeprazole (NEXIUM) 40 MG capsule Take 40 mg by mouth daily at 12 noon.      . fluticasone (FLONASE) 50 MCG/ACT nasal spray Place 1 spray into both nostrils daily.      . Fluticasone-Salmeterol (ADVAIR) 250-50 MCG/DOSE AEPB Inhale 1 puff into the lungs 2 (two) times daily.      . furosemide (LASIX) 20 MG tablet Take 20 mg by mouth 2 (two) times daily.      Sonya Kick Ethyl (VASCEPA) 1 G CAPS Take 1 g by mouth 2 (two) times daily.      [name] lisinopril (PRINIVIL,ZESTRIL) 10 MG tablet Take 1 tablet (10 mg total) by mouth daily.  90 tablet  5  . meclizine (ANTIVERT) 25 MG tablet Take 25 mg by mouth 3 (three) times daily as needed for dizziness.      . metoprolol succinate (TOPROL-XL) 50 MG 24 hr tablet Take 50 mg by mouth daily. Take with or immediately following a meal.      .  nitroGLYCERIN (NITROSTAT) 0.4 MG SL tablet Place 1 tablet (0.4 mg total) under the tongue every 5 (five) minutes as needed.  25 tablet  2  . oxyCODONE-acetaminophen (PERCOCET) 10-325 MG per tablet Take 1 tablet by mouth every 8 (eight) hours as needed for pain.      . rosuvastatin (CRESTOR) 20 MG tablet Take 1 tablet (20 mg total) by mouth daily.  30 tablet  1  . tiZANidine (ZANAFLEX) 4 MG tablet Take 4 mg by mouth every 8 (eight) hours as needed for muscle spasms.      . valACYclovir (VALTREX) 500 MG tablet Take 500 mg by mouth daily as needed (fever blisters).      . VESICARE 5 MG tablet Take 5 mg by mouth daily.        No current facility-administered medications on file prior to visit.    No Known Allergies  Past Medical History  Diagnosis Date  . CAD (coronary artery disease) 01/2010    a. NSTEMI 11/2008 s/p  DES to LCx. b. NSTEMI 01/2010 secondary to thrombotic RCA lesion, med rx.; negative Myoview in January 2014  . PAD (peripheral artery disease)     a. Emboli to R foot 2010 from partially occlusive lesion in R EIA, s/p stenting. - followed by Dr. Donnetta Hutching  . Anxiety   . Depression   . Hyperlipidemia   . Tobacco abuse   . Overweight(278.02)   . OP (osteoporosis)   . HTN (hypertension)   . Lumbar disc disease   . CHF (congestive heart failure)   . Stroke   . Myocardial infarction   . TIA (transient ischemic attack)   . Sleep apnea   . Shortness of breath   . GERD (gastroesophageal reflux disease)   . Headache     Past Surgical History  Procedure Laterality Date  . Iliac artery stent      RIGHT ILIAC STENT  . Knee arthroscopy    . Lumbar laminectomy    . Tubal ligation    . Hysterectomy -age 19    . Eye surgery      at age 63    History  Smoking status  . Former Smoker  . Quit date: 11/20/2012  Smokeless tobacco  . Never Used    History  Alcohol Use No    Family History  Problem Relation Age of Onset  . Alzheimer's disease Mother   . Cancer Brother     Review of Systems: The review of systems is per the HPI. Sonya Small does complain of increased depression.  All other systems were reviewed and are negative.  Physical Exam: BP 110/52  Pulse 66  Ht 5\' 5"  (1.651 m)  Wt 184 lb 6.4 oz (83.643 kg)  BMI 30.69 kg/m2 Patient is pleasant and in no acute distress. Skin is warm and dry. Color is normal.  HEENT is unremarkable. There is a soft left carotid bruit. Normocephalic/atraumatic. PERRL. Sclera are nonicteric. Neck is supple. No masses. No JVD. Lungs are clear. Cardiac exam shows a regular rate and rhythm. Abdomen is soft. Extremities are without edema. Gait is a little unsteady. ROM appears intact. No gross neurologic deficits are noted.  LABORATORY DATA:  Lab Results  Component Value Date   WBC 10.9* 07/12/2014   HGB 13.6 07/12/2014   HCT 43.3 07/12/2014   PLT 316  07/12/2014   GLUCOSE 145* 07/12/2014   CHOL 179 07/13/2014   TRIG 212* 07/13/2014   HDL 36* 07/13/2014   LDLCALC 101* 07/13/2014  ALT 20 07/12/2014   AST 29 07/12/2014   NA 142 07/12/2014   K 4.7 07/12/2014   CL 102 07/12/2014   CREATININE 0.96 07/12/2014   BUN 28* 07/12/2014   CO2 25 07/12/2014   TSH 1.130 07/13/2014   INR 1.06 07/12/2014   HGBA1C 6.1* 07/13/2014   Ecg: NSR with old anteroseptal infarct. No acute change.  Echo Study Conclusions 07/13/14   Study Conclusions  - Left ventricle: The cavity size was normal. Systolic function was normal. The estimated ejection fraction was in the range of 55% to 60%. Wall motion was normal; there were no regional wall motion abnormalities. Doppler parameters are consistent with abnormal left ventricular relaxation (grade 1 diastolic dysfunction). - Aortic valve: There was mild regurgitation.    MYOVIEW IMPRESSION:10/16/12  1. Suspected scarring at the cardiac apex, without inducible ischemia. Left ventricular ejection fraction calculated at 62%.   Original Report Authenticated By: Van Clines, M.D.        Last Resulted: 10/16/12 4:04 PM      Assessment / Plan: 1. Chest pain- chronic - has known CAD - Myoview in 2014 normal. Symptoms are stable. Will continue medical therapy. Congratulated her on Smoking cessation. Follow up in 4 months  2. Tobacco abuse - now stopped.  3. PVD - chronic leg pain. Not clearly claudication. Feet well perfused. Continue follow up with Dr. Donnetta Hutching.   4. Recent right MCA CVA. On ASA and Plavix. Follow up with Neuro as planned.   5. Hyperlipidemia on higher dose statin with recent CVA  6. Depression. On Wellbutrin. Needs to discuss with primary MD.

## 2014-07-14 NOTE — Patient Instructions (Signed)
Continue your current therapy  I will see you in 4 months  

## 2014-08-12 ENCOUNTER — Ambulatory Visit: Payer: Self-pay | Admitting: Neurology

## 2014-08-23 ENCOUNTER — Ambulatory Visit (INDEPENDENT_AMBULATORY_CARE_PROVIDER_SITE_OTHER): Payer: Medicare Other | Admitting: Neurology

## 2014-08-23 ENCOUNTER — Encounter: Payer: Self-pay | Admitting: Neurology

## 2014-08-23 VITALS — BP 130/76 | HR 66 | Ht 65.0 in | Wt 182.8 lb

## 2014-08-23 DIAGNOSIS — I25119 Atherosclerotic heart disease of native coronary artery with unspecified angina pectoris: Secondary | ICD-10-CM

## 2014-08-23 DIAGNOSIS — E785 Hyperlipidemia, unspecified: Secondary | ICD-10-CM | POA: Insufficient documentation

## 2014-08-23 DIAGNOSIS — Z87891 Personal history of nicotine dependence: Secondary | ICD-10-CM | POA: Insufficient documentation

## 2014-08-23 DIAGNOSIS — I739 Peripheral vascular disease, unspecified: Secondary | ICD-10-CM

## 2014-08-23 DIAGNOSIS — I10 Essential (primary) hypertension: Secondary | ICD-10-CM

## 2014-08-23 DIAGNOSIS — I63312 Cerebral infarction due to thrombosis of left middle cerebral artery: Secondary | ICD-10-CM | POA: Insufficient documentation

## 2014-08-23 NOTE — Progress Notes (Signed)
STROKE NEUROLOGY FOLLOW UP NOTE  NAME: Sonya Small DOB: 1951/03/26  REASON FOR VISIT: stroke follow up HISTORY FROM: chart and pt  Today we had the pleasure of seeing Sonya Small in follow-up at our Neurology Clinic. Pt was accompanied by neice.   History Summary Sonya Small is an 63 y.o. female history CAD, PVD, HTN, HLD and former smoker was admitted to the [facility] with a complaint of numbness and weakness involving right side as well as facial asymmetry. She has no previous history of stroke or TIA. She's been on aspirin and Plavix daily. CT scan of the head showed no acute intracranial abnormality. MRI showed small left posterior internal capsular infarct. She was kept on ASA and plavix. Her symptoms recovered quickly and she was discharged in good condition.  Interval History During the interval time, the patient has been doing well. No neurological complains. She saw her PCP last week and will see her again in 10/2014 for repeat lab works. She has quit smoking one year ago. On ASA and plavix and complaint with medication. She is still using CPAP daily for OSA but she still has daytime sleepiness, so she is on armodafinil which is able to keep her awake alert during the day.    REVIEW OF SYSTEMS: Full 14 system review of systems performed and notable only for those listed below and in HPI above, all others are negative:  Constitutional: fatigue  Cardiovascular: swelling in legs  Ear/Nose/Throat: N/A  Skin: N/A  Eyes: blurry vision  Respiratory: SOB, wheezing, snoring  Gastroitestinal: N/A  Genitourinary: N/A Hematology/Lymphatic: easy bruising  Endocrine: increased thirst  Musculoskeletal: aching muscles  Allergy/Immunology: N/A  Neurological: memory loss, confusion, HA, numbness, weakness, sleepiness, restless leg  Psychiatric: anxiety, decreased energy, disinterest in activities  The following represents the patient's updated allergies and side effects list: No Known  Allergies  Labs since last visit of relevance include the following: Results for orders placed or performed during the hospital encounter of 07/12/14  Urine culture  Result Value Ref Range   Specimen Description URINE, RANDOM    Special Requests NONE    Culture  Setup Time      07/13/2014 15:08 Performed at [place] Performed at [facility]    Culture NO GROWTH Performed at [facility]    Report Status 07/14/2014 FINAL   CBC with Differential  Result Value Ref Range   WBC 10.9 (H) 4.0 - 10.5 K/uL   RBC 4.91 3.87 - 5.11 MIL/uL   Hemoglobin 13.6 12.0 - 15.0 g/dL   HCT 43.3 36.0 - 46.0 %   MCV 88.2 78.0 - 100.0 fL   MCH 27.7 26.0 - 34.0 pg   MCHC 31.4 30.0 - 36.0 g/dL   RDW 13.5 11.5 - 15.5 %   Platelets 316 150 - 400 K/uL   Neutrophils Relative % 64 43 - 77 %   Neutro Abs 7.0 1.7 - 7.7 K/uL   Lymphocytes Relative 28 12 - 46 %   Lymphs Abs 3.1 0.7 - 4.0 K/uL   Monocytes Relative 7 3 - 12 %   Monocytes Absolute 0.7 0.1 - 1.0 K/uL   Eosinophils Relative 1 0 - 5 %   Eosinophils Absolute 0.1 0.0 - 0.7 K/uL   Basophils Relative 0 0 - 1 %   Basophils Absolute 0.0 0.0 - 0.1 K/uL  Comprehensive metabolic panel  Result Value Ref Range   Sodium 142  137 - 147 mEq/L   Potassium 4.7 3.7 - 5.3 mEq/L   Chloride 102 96 - 112 mEq/L   CO2 25 19 - 32 mEq/L   Glucose, Bld 145 (H) 70 - 99 mg/dL   BUN 28 (H) 6 - 23 mg/dL   Creatinine, Ser 0.96 0.50 - 1.10 mg/dL   Calcium 9.4 8.4 - 10.5 mg/dL   Total Protein 7.3 6.0 - 8.3 g/dL   Albumin 3.9 3.5 - 5.2 g/dL   AST 29 0 - 37 U/L   ALT 20 0 - 35 U/L   Alkaline Phosphatase 110 39 - 117 U/L   Total Bilirubin <0.2 (L) 0.3 - 1.2 mg/dL   GFR calc non Af Amer 61 (L) >90 mL/min   GFR calc Af Amer 70 (L) >90 mL/min   Anion gap 15 5 - 15  Urinalysis, Routine w reflex microscopic  Result Value Ref Range   Color, Urine YELLOW YELLOW   APPearance CLOUDY (A) CLEAR   Specific Gravity, Urine 1.027  1.005 - 1.030   pH 5.0 5.0 - 8.0   Glucose, UA NEGATIVE NEGATIVE mg/dL   Hgb urine dipstick NEGATIVE NEGATIVE   Bilirubin Urine SMALL (A) NEGATIVE   Ketones, ur NEGATIVE NEGATIVE mg/dL   Protein, ur NEGATIVE NEGATIVE mg/dL   Urobilinogen, UA 1.0 0.0 - 1.0 mg/dL   Nitrite NEGATIVE NEGATIVE   Leukocytes, UA SMALL (A) NEGATIVE  Ethanol  Result Value Ref Range   Alcohol, Ethyl (B) <11 0 - 11 mg/dL  Protime-INR  Result Value Ref Range   Prothrombin Time 14.0 11.6 - 15.2 seconds   INR 1.06 0.00 - 1.49  APTT  Result Value Ref Range   aPTT 32 24 - 37 seconds  Urine Drug Screen  Result Value Ref Range   Opiates NONE DETECTED NONE DETECTED   Cocaine NONE DETECTED NONE DETECTED   Benzodiazepines POSITIVE (A) NONE DETECTED   Amphetamines NONE DETECTED NONE DETECTED   Tetrahydrocannabinol NONE DETECTED NONE DETECTED   Barbiturates NONE DETECTED NONE DETECTED  Urine microscopic-add on  Result Value Ref Range   Squamous Epithelial / LPF FEW (A) RARE   WBC, UA 3-6 <3 WBC/hpf   RBC / HPF 0-2 <3 RBC/hpf   Bacteria, UA FEW (A) RARE  Hemoglobin A1c  Result Value Ref Range   Hgb A1c MFr Bld 6.1 (H) <5.7 %   Mean Plasma Glucose 128 (H) <117 mg/dL  TSH  Result Value Ref Range   TSH 1.130 0.350 - 4.500 uIU/mL  Lipid panel  Result Value Ref Range   Cholesterol 179 0 - 200 mg/dL   Triglycerides 212 (H) <150 mg/dL   HDL 36 (L) >39 mg/dL   Total CHOL/HDL Ratio 5.0 RATIO   VLDL 42 (H) 0 - 40 mg/dL   LDL Cholesterol 101 (H) 0 - 99 mg/dL  I-stat troponin, ED (not at [facility])  Result Value Ref Range   Troponin i, poc 0.01 0.00 - 0.08 ng/mL   Comment 3            The neurologically relevant items on the patient's problem list were reviewed on today's visit.  Neurologic Examination  A problem focused neurological exam (12 or more points of the single system neurologic examination, vital signs counts as 1 point, cranial nerves count for 8 points) was performed.  Blood pressure 130/76, pulse 66,  height 5\' 5"  (1.651 m), weight 182 lb 12.8 oz (82.918 kg).  General - Well nourished, well developed, in no apparent distress.  Ophthalmologic - not able to see through.  Cardiovascular - Regular rate and rhythm with no murmur.  Mental Status -  Level of arousal and orientation to time, place, and person were intact. Language including expression, naming, repetition, comprehension, reading, and writing was assessed and found intact. Fund of Knowledge was assessed and was intact.  Cranial Nerves II - XII - II - Visual field intact OU. III, IV, VI - Extraocular movements intact. V - Facial sensation intact bilaterally. VII - Facial movement intact bilaterally. VIII - Hearing & vestibular intact bilaterally. X - Palate elevates symmetrically. XI - Chin turning & shoulder shrug intact bilaterally. XII - Tongue protrusion intact.  Motor Strength - The patient's strength was normal in all extremities and pronator drift was absent.  Bulk was normal and fasciculations were absent.   Motor Tone - Muscle tone was assessed at the neck and appendages and was normal.  Reflexes - The patient's reflexes were normal in all extremities and she had no pathological reflexes.  Sensory - Light touch, temperature/pinprick, vibration and proprioception, and Romberg testing were assessed and were normal.    Coordination - The patient had normal movements in the hands and feet with no ataxia or dysmetria.  Tremor was absent.  Gait and Station - The patient's transfers, posture, gait, station, and turns were observed as normal.  Data reviewed: I personally reviewed the images and agree with the radiology interpretations.  Ct Head Wo Contrast 07/12/2014 No acute intracranial abnormalities. Chronic atrophy and small vessel ischemic changes.   Mr Brain Wo Contrast 07/13/2014 Acute RIGHT MCA lenticulostriate territory infarct affecting the basal ganglia and periventricular white matter. Extensive  chronic microvascular ischemic change, likely hypertensive related.  Mr Jodene Nam Head Wo Contrast 07/13/2014 No proximal flow reducing lesion on MRA.   Carotid Doppler There is 1-39% bilateral ICA stenosis. Vertebral artery flow is antegrade.  2D echo - - Left ventricle: The cavity size was normal. Systolic function was normal. The estimated ejection fraction was in the range of 55% to 60%. Wall motion was normal; there were no regional wall motion abnormalities. Doppler parameters are consistent with abnormal left ventricular relaxation (grade 1 diastolic dysfunction). - Aortic valve: There was mild regurgitation.  Assessment: As you may recall, she is a 63 y.o. Caucasian female with PMH of HTN, HLD, CAD, PVD, former smoker was admitted for small left posterior IC infarct. Currently on neurological deficit. On ASA and plavix before the admission for cardiac prevention. Now continued on ASA and plavix and crestor for cardiac and stroke prevention.   Plan:  - continue ASA and plavix and crestor for stroke and cardiac prevention - check BP at home - Follow up with your primary care physician for stroke risk factor modification. Recommend maintain blood pressure goal <130/80, diabetes with hemoglobin A1c goal below 6.5% and lipids with LDL cholesterol goal below 70 mg/dL.  - continue CPAP for OSA - RTC in 6 months  No orders of the defined types were placed in this encounter.    Meds ordered this encounter  Medications  . imipramine (TOFRANIL) 50 MG tablet    Sig:     Patient Instructions  - continue ASA and plavix and crestor for stroke prevention - ask Dr. [name] for a prescription for home BP monitoring device and check BP at home - Follow up with your primary care physician for stroke risk factor modification. Recommend maintain blood pressure goal <130/80, diabetes with hemoglobin A1c goal below 6.5% and lipids with LDL  cholesterol goal below 70 mg/dL.  - continue to  use CPAP for OSA.  - follow up in 6 months.   Rosalin Hawking, MD PhD [facility] [address], [place], [place] [address] [phone]

## 2014-08-23 NOTE — Patient Instructions (Addendum)
-   continue ASA and plavix and crestor for stroke prevention - ask Dr. [name] for a prescription for home BP monitoring device and check BP at home - Follow up with your primary care physician for stroke risk factor modification. Recommend maintain blood pressure goal <130/80, diabetes with hemoglobin A1c goal below 6.5% and lipids with LDL cholesterol goal below 70 mg/dL.  - continue to use CPAP for OSA.  - follow up in 6 months.

## 2014-10-28 ENCOUNTER — Ambulatory Visit: Payer: Medicare Other | Admitting: Cardiology

## 2015-01-03 ENCOUNTER — Ambulatory Visit (INDEPENDENT_AMBULATORY_CARE_PROVIDER_SITE_OTHER): Payer: Medicare Other | Admitting: Cardiology

## 2015-01-03 ENCOUNTER — Encounter: Payer: Self-pay | Admitting: Cardiology

## 2015-01-03 VITALS — BP 110/70 | HR 62 | Ht 65.0 in | Wt 183.2 lb

## 2015-01-03 DIAGNOSIS — E785 Hyperlipidemia, unspecified: Secondary | ICD-10-CM | POA: Diagnosis not present

## 2015-01-03 DIAGNOSIS — I10 Essential (primary) hypertension: Secondary | ICD-10-CM | POA: Diagnosis not present

## 2015-01-03 DIAGNOSIS — I25119 Atherosclerotic heart disease of native coronary artery with unspecified angina pectoris: Secondary | ICD-10-CM

## 2015-01-03 DIAGNOSIS — I739 Peripheral vascular disease, unspecified: Secondary | ICD-10-CM

## 2015-01-03 NOTE — Patient Instructions (Addendum)
Continue your current therapy  Get more exercise!!!  I will see you in 6 months.

## 2015-01-03 NOTE — Progress Notes (Signed)
Sonya Small Date of Birth: 11-27-1950 Medical Record #[id number]  History of Present Illness: Sonya Small is seen for follow up CAD.  She has known CAD with prior NSTEMI back in 2011, PAD s/p iliac stent, COPD and  tobacco abuse.   In January 2014 she was admitted with recurrent chest pain. Cardiac enzymes were negative. Myoview was negative. She had a CT of the abdomen and pelvis which showed no stones.   She was admitted with acute CVA 10/19-10/20, 2015. She presented with slurred speech, drawing of her mouth and emotional lability. MRI showed an acute right MCA CVA involving the basal ganglia and periventricular white matter. She was started on ASA 81 mg daily along with Plavix. Crestor dose was increased to 20 mg. Echo was unremarkable. Carotids were patent by MRI.   She has multiple chronic symptoms. She complains of pain in her back radiating down her left arm with numbness and tingling. She has pain in her buttocks radiating down her legs. Dopplers in the past were OK. She has chronic sneezing spells. She feels well overall. She is fairly sedentary. She did quit smoking over a year ago.    Current Outpatient Prescriptions on File Prior to Visit  Medication Sig Dispense Refill  . Aclidinium Bromide (TUDORZA PRESSAIR) 400 MCG/ACT AEPB Inhale 400 mcg into the lungs.    [name] albuterol (PROVENTIL HFA;VENTOLIN HFA) 108 (90 BASE) MCG/ACT inhaler Inhale 2 puffs into the lungs every 4 (four) hours as needed for wheezing or shortness of breath.    . ALPRAZolam (XANAX) 0.5 MG tablet Take 0.5 mg by mouth 2 (two) times daily as needed.     . Armodafinil (NUVIGIL) 200 MG TABS Take 200 mg by mouth daily.    [name] aspirin (ASPIRIN EC) 81 MG EC tablet Take 81 mg by mouth daily. Swallow whole.    [name] buPROPion (WELLBUTRIN XL) 300 MG 24 hr tablet Take 300 mg by mouth daily.     . Choline Fenofibrate (TRILIPIX) 135 MG capsule Take 1 capsule (135 mg total) by mouth daily. 30 capsule 5  . clopidogrel (PLAVIX) 75 MG  tablet Take 1 tablet (75 mg total) by mouth daily. 90 tablet 0  . diclofenac sodium (VOLTAREN) 1 % GEL Apply 2 g topically daily as needed (mild pain).    [name] esomeprazole (NEXIUM) 40 MG capsule Take 40 mg by mouth daily at 12 noon.    . fluticasone (FLONASE) 50 MCG/ACT nasal spray Place 1 spray into both nostrils daily.    . Fluticasone-Salmeterol (ADVAIR) 250-50 MCG/DOSE AEPB Inhale 1 puff into the lungs 2 (two) times daily.    . furosemide (LASIX) 20 MG tablet Take 20 mg by mouth 2 (two) times daily.    Vanessa Kick Ethyl (VASCEPA) 1 G CAPS Take 1 g by mouth 2 (two) times daily.    [name] lisinopril (PRINIVIL,ZESTRIL) 10 MG tablet Take 1 tablet (10 mg total) by mouth daily. 90 tablet 5  . metoprolol (LOPRESSOR) 50 MG tablet Take 1 tablet by mouth daily.    . nitroGLYCERIN (NITROSTAT) 0.4 MG SL tablet Place 1 tablet (0.4 mg total) under the tongue every 5 (five) minutes as needed. 25 tablet 2  . oxyCODONE-acetaminophen (PERCOCET) 10-325 MG per tablet Take 1 tablet by mouth every 8 (eight) hours as needed for pain.    . rosuvastatin (CRESTOR) 20 MG tablet Take 1 tablet (20 mg total) by mouth daily. 30 tablet 1  . valACYclovir (VALTREX) 500 MG tablet Take 500 mg by  mouth daily as needed (fever blisters).    . VESICARE 5 MG tablet Take 5 mg by mouth daily.     [name] imipramine (TOFRANIL) 50 MG tablet      No current facility-administered medications on file prior to visit.    No Known Allergies  Past Medical History  Diagnosis Date  . CAD (coronary artery disease) 01/2010    a. NSTEMI 11/2008 s/p DES to LCx. b. NSTEMI 01/2010 secondary to thrombotic RCA lesion, med rx.; negative Myoview in January 2014  . PAD (peripheral artery disease)     a. Emboli to R foot 2010 from partially occlusive lesion in R EIA, s/p stenting. - followed by Dr. Donnetta Hutching  . Anxiety   . Depression   . Hyperlipidemia   . Tobacco abuse   . Overweight(278.02)   . OP (osteoporosis)   . HTN (hypertension)   . Lumbar disc disease     . CHF (congestive heart failure)   . Stroke   . Myocardial infarction   . TIA (transient ischemic attack)   . Sleep apnea   . Shortness of breath   . GERD (gastroesophageal reflux disease)   . Headache     Past Surgical History  Procedure Laterality Date  . Iliac artery stent      RIGHT ILIAC STENT  . Knee arthroscopy    . Lumbar laminectomy    . Tubal ligation    . Hysterectomy -age 67    . Eye surgery      at age 80    History  Smoking status  . Former Smoker  . Quit date: 11/20/2012  Smokeless tobacco  . Never Used    History  Alcohol Use No    Family History  Problem Relation Age of Onset  . Alzheimer's disease Mother   . Cancer Brother   . Heart disease      Grandfather  . Hepatitis C Brother   . Diabetes Brother   . Thyroid disease Brother   . Hypertension Brother   . Depression Brother     Review of Systems: The review of systems is per the HPI.   All other systems were reviewed and are negative.  Physical Exam: BP 110/70 mmHg  Pulse 62  Ht 5\' 5"  (1.651 m)  Wt 183 lb 3 oz (83.093 kg)  BMI 30.48 kg/m2 Patient is pleasant and in no acute distress. Skin is warm and dry. Color is normal.  HEENT is unremarkable. There is a soft left carotid bruit. Normocephalic/atraumatic. PERRL. Sclera are nonicteric. Neck is supple. No masses. No JVD. Lungs are clear. Cardiac exam shows a regular rate and rhythm. Abdomen is soft. Extremities are without edema. Gait is a little unsteady. ROM appears intact. No gross neurologic deficits are noted.  LABORATORY DATA:  Lab Results  Component Value Date   WBC 10.9* 07/12/2014   HGB 13.6 07/12/2014   HCT 43.3 07/12/2014   PLT 316 07/12/2014   GLUCOSE 145* 07/12/2014   CHOL 179 07/13/2014   TRIG 212* 07/13/2014   HDL 36* 07/13/2014   LDLCALC 101* 07/13/2014   ALT 20 07/12/2014   AST 29 07/12/2014   NA 142 07/12/2014   K 4.7 07/12/2014   CL 102 07/12/2014   CREATININE 0.96 07/12/2014   BUN 28* 07/12/2014    CO2 25 07/12/2014   TSH 1.130 07/13/2014   INR 1.06 07/12/2014   HGBA1C 6.1* 07/13/2014     Echo Study Conclusions 07/13/14   Study Conclusions  -  Left ventricle: The cavity size was normal. Systolic function was normal. The estimated ejection fraction was in the range of 55% to 60%. Wall motion was normal; there were no regional wall motion abnormalities. Doppler parameters are consistent with abnormal left ventricular relaxation (grade 1 diastolic dysfunction). - Aortic valve: There was mild regurgitation.    MYOVIEW IMPRESSION:10/16/12  1. Suspected scarring at the cardiac apex, without inducible ischemia. Left ventricular ejection fraction calculated at 62%.   Original Report Authenticated By: Van Clines, M.D.        Last Resulted: 10/16/12 4:04 PM      Assessment / Plan: 1.  CAD - Myoview in 2014 normal. Symptoms are stable. Will continue medical therapy. Minimal atypical symptoms. Congratulated her on Smoking cessation. Follow up in 6 months.  2. Tobacco abuse - now stopped.  3. PVD - chronic leg pain. Not clearly claudication. Feet well perfused. Continue follow up with Dr. Donnetta Hutching.   4. S/p right MCA CVA. On ASA and Plavix. No recurrent symptoms.  5. Hyperlipidemia good control by labs in October.   I have recommended increased aerobic activity. I will follow up in 6 months.

## 2015-02-22 ENCOUNTER — Encounter: Payer: Self-pay | Admitting: Neurology

## 2015-02-22 ENCOUNTER — Ambulatory Visit (INDEPENDENT_AMBULATORY_CARE_PROVIDER_SITE_OTHER): Payer: Medicare Other | Admitting: Neurology

## 2015-02-22 VITALS — BP 103/64 | HR 64 | Wt 183.2 lb

## 2015-02-22 DIAGNOSIS — E785 Hyperlipidemia, unspecified: Secondary | ICD-10-CM

## 2015-02-22 DIAGNOSIS — I63312 Cerebral infarction due to thrombosis of left middle cerebral artery: Secondary | ICD-10-CM | POA: Diagnosis not present

## 2015-02-22 DIAGNOSIS — I25119 Atherosclerotic heart disease of native coronary artery with unspecified angina pectoris: Secondary | ICD-10-CM

## 2015-02-22 DIAGNOSIS — Z87891 Personal history of nicotine dependence: Secondary | ICD-10-CM

## 2015-02-22 DIAGNOSIS — I739 Peripheral vascular disease, unspecified: Secondary | ICD-10-CM | POA: Diagnosis not present

## 2015-02-22 DIAGNOSIS — D352 Benign neoplasm of pituitary gland: Secondary | ICD-10-CM | POA: Diagnosis not present

## 2015-02-22 DIAGNOSIS — I10 Essential (primary) hypertension: Secondary | ICD-10-CM | POA: Diagnosis not present

## 2015-02-22 NOTE — Patient Instructions (Signed)
-   continue ASA and plavix and crestor for stroke prevention - ask Dr. [name] for a prescription for home BP monitoring device and check BP at home - Follow up with your primary care physician for stroke risk factor modification. Recommend maintain blood pressure goal <130/80, diabetes with hemoglobin A1c goal below 6.5% and lipids with LDL cholesterol goal below 70 mg/dL.  - continue to use CPAP for OSA.  - your recent MRI showed stable pituitary adenoma, may consider to follow up in a year with repeat MRI via your PCP.  - follow up as needed.

## 2015-02-22 NOTE — Progress Notes (Signed)
STROKE NEUROLOGY FOLLOW UP NOTE  NAME: Sonya Small DOB: Oct 26, 1950  REASON FOR VISIT: stroke follow up HISTORY FROM: chart and pt  Today we had the pleasure of seeing Sonya Small in follow-up at our Neurology Clinic. Pt was accompanied by neice.   History Summary Sonya Small is an 64 y.o. female history CAD, PVD, HTN, HLD and former smoker was admitted to the [facility] with a complaint of numbness and weakness involving right side as well as facial asymmetry. She has no previous history of stroke or TIA. She's been on aspirin and Plavix daily. CT scan of the head showed no acute intracranial abnormality. MRI showed small left posterior internal capsular infarct. She was kept on ASA and plavix. Her symptoms recovered quickly and she was discharged in good condition.  Follow up 08/23/14 -  the patient has been doing well. No neurological complains. She saw her PCP last week and will see her again in 10/2014 for repeat lab works. She has quit smoking one year ago. On ASA and plavix and complaint with medication. She is still using CPAP daily for OSA but she still has daytime sleepiness, so she is on armodafinil which is able to keep her awake alert during the day.   Interval History During the interval time, the patient has been doing well. No recurrent neuro symptoms. Had MRI brain on 01/26/15 showed stable pituitary adenoma. TSH was normal in the past. Pt has no complains. Continued on ASA and plavix. BP 103/4 today.    REVIEW OF SYSTEMS: Full 14 system review of systems performed and notable only for those listed below and in HPI above, all others are negative:  Constitutional: excessive sweating  Cardiovascular:   Ear/Nose/Throat: ringing in ears  Skin: N/A  Eyes: blurry vision  Respiratory: SOB  Gastroitestinal: N/A  Genitourinary: N/A Hematology/Lymphatic: easy bruising  Endocrine: heat intolerance  Musculoskeletal: aching muscles, back pain  Allergy/Immunology: N/A  Neurological: HA,  dizziness Psychiatric: restless leg  The following represents the patient's updated allergies and side effects list: No Known Allergies  Labs since last visit of relevance include the following: Results for orders placed or performed during the hospital encounter of 07/12/14  Urine culture  Result Value Ref Range   Specimen Description URINE, RANDOM    Special Requests NONE    Culture  Setup Time      07/13/2014 15:08 Performed at [place] Performed at [facility]    Culture NO GROWTH Performed at [facility]    Report Status 07/14/2014 FINAL   CBC with Differential  Result Value Ref Range   WBC 10.9 (H) 4.0 - 10.5 K/uL   RBC 4.91 3.87 - 5.11 MIL/uL   Hemoglobin 13.6 12.0 - 15.0 g/dL   HCT 43.3 36.0 - 46.0 %   MCV 88.2 78.0 - 100.0 fL   MCH 27.7 26.0 - 34.0 pg   MCHC 31.4 30.0 - 36.0 g/dL   RDW 13.5 11.5 - 15.5 %   Platelets 316 150 - 400 K/uL   Neutrophils Relative % 64 43 - 77 %   Neutro Abs 7.0 1.7 - 7.7 K/uL   Lymphocytes Relative 28 12 - 46 %   Lymphs Abs 3.1 0.7 - 4.0 K/uL   Monocytes Relative 7 3 - 12 %   Monocytes Absolute 0.7 0.1 - 1.0 K/uL   Eosinophils Relative 1 0 - 5 %   Eosinophils Absolute 0.1 0.0 -  0.7 K/uL   Basophils Relative 0 0 - 1 %   Basophils Absolute 0.0 0.0 - 0.1 K/uL  Comprehensive metabolic panel  Result Value Ref Range   Sodium 142 137 - 147 mEq/L   Potassium 4.7 3.7 - 5.3 mEq/L   Chloride 102 96 - 112 mEq/L   CO2 25 19 - 32 mEq/L   Glucose, Bld 145 (H) 70 - 99 mg/dL   BUN 28 (H) 6 - 23 mg/dL   Creatinine, Ser 0.96 0.50 - 1.10 mg/dL   Calcium 9.4 8.4 - 10.5 mg/dL   Total Protein 7.3 6.0 - 8.3 g/dL   Albumin 3.9 3.5 - 5.2 g/dL   AST 29 0 - 37 U/L   ALT 20 0 - 35 U/L   Alkaline Phosphatase 110 39 - 117 U/L   Total Bilirubin <0.2 (L) 0.3 - 1.2 mg/dL   GFR calc non Af Amer 61 (L) >90 mL/min   GFR calc Af Amer 70 (L) >90 mL/min   Anion gap 15 5 - 15  Urinalysis, Routine w reflex  microscopic  Result Value Ref Range   Color, Urine YELLOW YELLOW   APPearance CLOUDY (A) CLEAR   Specific Gravity, Urine 1.027 1.005 - 1.030   pH 5.0 5.0 - 8.0   Glucose, UA NEGATIVE NEGATIVE mg/dL   Hgb urine dipstick NEGATIVE NEGATIVE   Bilirubin Urine SMALL (A) NEGATIVE   Ketones, ur NEGATIVE NEGATIVE mg/dL   Protein, ur NEGATIVE NEGATIVE mg/dL   Urobilinogen, UA 1.0 0.0 - 1.0 mg/dL   Nitrite NEGATIVE NEGATIVE   Leukocytes, UA SMALL (A) NEGATIVE  Ethanol  Result Value Ref Range   Alcohol, Ethyl (B) <11 0 - 11 mg/dL  Protime-INR  Result Value Ref Range   Prothrombin Time 14.0 11.6 - 15.2 seconds   INR 1.06 0.00 - 1.49  APTT  Result Value Ref Range   aPTT 32 24 - 37 seconds  Urine Drug Screen  Result Value Ref Range   Opiates NONE DETECTED NONE DETECTED   Cocaine NONE DETECTED NONE DETECTED   Benzodiazepines POSITIVE (A) NONE DETECTED   Amphetamines NONE DETECTED NONE DETECTED   Tetrahydrocannabinol NONE DETECTED NONE DETECTED   Barbiturates NONE DETECTED NONE DETECTED  Urine microscopic-add on  Result Value Ref Range   Squamous Epithelial / LPF FEW (A) RARE   WBC, UA 3-6 <3 WBC/hpf   RBC / HPF 0-2 <3 RBC/hpf   Bacteria, UA FEW (A) RARE  Hemoglobin A1c  Result Value Ref Range   Hgb A1c MFr Bld 6.1 (H) <5.7 %   Mean Plasma Glucose 128 (H) <117 mg/dL  TSH  Result Value Ref Range   TSH 1.130 0.350 - 4.500 uIU/mL  Lipid panel  Result Value Ref Range   Cholesterol 179 0 - 200 mg/dL   Triglycerides 212 (H) <150 mg/dL   HDL 36 (L) >39 mg/dL   Total CHOL/HDL Ratio 5.0 RATIO   VLDL 42 (H) 0 - 40 mg/dL   LDL Cholesterol 101 (H) 0 - 99 mg/dL  I-stat troponin, ED (not at [facility])  Result Value Ref Range   Troponin i, poc 0.01 0.00 - 0.08 ng/mL   Comment 3            The neurologically relevant items on the patient's problem list were reviewed on today's visit.  Neurologic Examination  A problem focused neurological exam (12 or more points of the single system  neurologic examination, vital signs counts as 1 point, cranial nerves count for 8  points) was performed.  Blood pressure 103/64, pulse 64, weight 183 lb 3.2 oz (83.099 kg).  General - Well nourished, well developed, in no apparent distress.  Ophthalmologic - not able to see through.  Cardiovascular - Regular rate and rhythm with no murmur.  Mental Status -  Level of arousal and orientation to time, place, and person were intact. Language including expression, naming, repetition, comprehension, reading, and writing was assessed and found intact. Fund of Knowledge was assessed and was intact.  Cranial Nerves II - XII - II - Visual field intact OU. III, IV, VI - Extraocular movements intact. V - Facial sensation intact bilaterally. VII - Facial movement intact bilaterally. VIII - Hearing & vestibular intact bilaterally. X - Palate elevates symmetrically. XI - Chin turning & shoulder shrug intact bilaterally. XII - Tongue protrusion intact.  Motor Strength - The patient's strength was normal in all extremities and pronator drift was absent.  Bulk was normal and fasciculations were absent.   Motor Tone - Muscle tone was assessed at the neck and appendages and was normal.  Reflexes - The patient's reflexes were normal in all extremities and she had no pathological reflexes.  Sensory - Light touch, temperature/pinprick, vibration and proprioception, and Romberg testing were assessed and were normal.    Coordination - The patient had normal movements in the hands and feet with no ataxia or dysmetria.  Tremor was absent.  Gait and Station - The patient's transfers, posture, gait, station, and turns were observed as normal.  Data reviewed: I personally reviewed the images and agree with the radiology interpretations.  Ct Head Wo Contrast 07/12/2014 No acute intracranial abnormalities. Chronic atrophy and small vessel ischemic changes.   MRI brain 01/26/15 1. Mild enlargement of the  pituitary gland that stable since at least 2014. Main ddx is hyperplasia or occult adenoma. 2. Extensive chronic SVD with remote right lateral lenticulostriate infarct.  Mr Brain Wo Contrast 07/13/2014 Acute RIGHT MCA lenticulostriate territory infarct affecting the basal ganglia and periventricular white matter. Extensive chronic microvascular ischemic change, likely hypertensive related.  Mr Jodene Nam Head Wo Contrast 07/13/2014 No proximal flow reducing lesion on MRA.   Carotid Doppler There is 1-39% bilateral ICA stenosis. Vertebral artery flow is antegrade.  2D echo - - Left ventricle: The cavity size was normal. Systolic function was normal. The estimated ejection fraction was in the range of 55% to 60%. Wall motion was normal; there were no regional wall motion abnormalities. Doppler parameters are consistent with abnormal left ventricular relaxation (grade 1 diastolic dysfunction). - Aortic valve: There was mild regurgitation.  Assessment: As you may recall, she is a 65 y.o. Caucasian female with PMH of HTN, HLD, CAD, PVD, former smoker was admitted for small right BG infarct. Currently no neurological deficit. On ASA and plavix before the admission for cardiac prevention. Now continued on ASA and plavix and crestor for cardiac and stroke prevention. Doing well during the interval time.  Plan:  - continue ASA and plavix and crestor for stroke prevention - check BP at home - Follow up with your primary care physician for stroke risk factor modification. Recommend maintain blood pressure goal <130/80, diabetes with hemoglobin A1c goal below 6.5% and lipids with LDL cholesterol goal below 70 mg/dL.  - continue CPAP for OSA.  - Consider repeat MRI to follow up pituitary adenoma in a year via PCP.  - RTC PRN  No orders of the defined types were placed in this encounter.    Meds ordered this  encounter  Medications  . nystatin (MYCOSTATIN) 100000 UNIT/ML suspension      Sig:   . meclizine (ANTIVERT) 25 MG tablet    Sig: Take 25 mg by mouth 3 (three) times daily as needed for dizziness.  . polyethylene glycol (MIRALAX / GLYCOLAX) packet    Sig: Take 17 g by mouth daily.  . baclofen (LIORESAL) 10 MG tablet    Sig: Take 10 mg by mouth 2 (two) times daily.    Patient Instructions  - continue ASA and plavix and crestor for stroke prevention - ask Dr. [name] for a prescription for home BP monitoring device and check BP at home - Follow up with your primary care physician for stroke risk factor modification. Recommend maintain blood pressure goal <130/80, diabetes with hemoglobin A1c goal below 6.5% and lipids with LDL cholesterol goal below 70 mg/dL.  - continue to use CPAP for OSA.  - your recent MRI showed stable pituitary adenoma, may consider to follow up in a year with repeat MRI via your PCP.  - follow up as needed.    Rosalin Hawking, MD PhD [facility] [address], [place], [place] [address] [phone]

## 2015-04-01 ENCOUNTER — Other Ambulatory Visit: Payer: Self-pay | Admitting: Orthopaedic Surgery

## 2015-04-01 DIAGNOSIS — M5000 Cervical disc disorder with myelopathy, unspecified cervical region: Secondary | ICD-10-CM

## 2015-04-14 ENCOUNTER — Ambulatory Visit
Admission: RE | Admit: 2015-04-14 | Discharge: 2015-04-14 | Disposition: A | Discharge status: Home / Self Care | Payer: Medicare Other | Source: Ambulatory Visit | Attending: Orthopaedic Surgery | Admitting: Orthopaedic Surgery

## 2015-04-14 DIAGNOSIS — M5000 Cervical disc disorder with myelopathy, unspecified cervical region: Secondary | ICD-10-CM

## 2015-10-28 ENCOUNTER — Encounter: Payer: Self-pay | Admitting: Cardiology

## 2015-10-28 ENCOUNTER — Ambulatory Visit (INDEPENDENT_AMBULATORY_CARE_PROVIDER_SITE_OTHER): Payer: Medicare Other | Admitting: Cardiology

## 2015-10-28 VITALS — BP 102/60 | HR 66 | Ht 65.0 in | Wt 185.5 lb

## 2015-10-28 DIAGNOSIS — I5032 Chronic diastolic (congestive) heart failure: Secondary | ICD-10-CM | POA: Diagnosis not present

## 2015-10-28 DIAGNOSIS — I739 Peripheral vascular disease, unspecified: Secondary | ICD-10-CM

## 2015-10-28 DIAGNOSIS — I10 Essential (primary) hypertension: Secondary | ICD-10-CM

## 2015-10-28 DIAGNOSIS — I2510 Atherosclerotic heart disease of native coronary artery without angina pectoris: Secondary | ICD-10-CM

## 2015-10-28 DIAGNOSIS — B370 Candidal stomatitis: Secondary | ICD-10-CM

## 2015-10-28 MED ORDER — CLOTRIMAZOLE 10 MG MT TROC: 10.0000 mg | Freq: Every day | OROMUCOSAL | Status: DC

## 2015-10-28 NOTE — Progress Notes (Signed)
Sonya Small Date of Birth: 1951-07-13 Medical Record [id number]  History of Present Illness: Ms. Andresen is seen for follow up CAD.  She has known CAD with prior NSTEMI back in 2011, PAD s/p iliac stent, COPD and  tobacco abuse.   In January 2014 she was admitted with recurrent chest pain. Cardiac enzymes were negative. Myoview was negative.   She was admitted with acute CVA 10/19-10/20, 2015. She presented with slurred speech, drawing of her mouth and emotional lability. MRI showed an acute right MCA CVA involving the basal ganglia and periventricular white matter. She was started on ASA 81 mg daily along with Plavix. Crestor dose was increased to 20 mg. Echo was unremarkable. Carotids were patent by MRI.   On follow up she complains of pain in her back radiating from her coccyx down both legs to her knees with numbness.  She complains of dry mouth with thrush. Uses Advair bid. Complains that she has no energy. She is very sedentary. Notes SOB with exertion that is chronic and unchanged. No chest pain. Uses CPAP most nights but sometimes has to take it off because she feels like she is drowning.    Current Outpatient Prescriptions on File Prior to Visit  Medication Sig Dispense Refill  . Aclidinium Bromide (TUDORZA PRESSAIR) 400 MCG/ACT AEPB Inhale 400 mcg into the lungs.    [name] albuterol (PROVENTIL HFA;VENTOLIN HFA) 108 (90 BASE) MCG/ACT inhaler Inhale 2 puffs into the lungs every 4 (four) hours as needed for wheezing or shortness of breath.    . ALPRAZolam (XANAX) 0.5 MG tablet Take 0.5 mg by mouth 2 (two) times daily as needed.     [name] aspirin (ASPIRIN EC) 81 MG EC tablet Take 81 mg by mouth daily. Swallow whole.    [name] buPROPion (WELLBUTRIN XL) 300 MG 24 hr tablet Take 300 mg by mouth daily.     . clopidogrel (PLAVIX) 75 MG tablet Take 1 tablet (75 mg total) by mouth daily. 90 tablet 0  . diclofenac sodium (VOLTAREN) 1 % GEL Apply 2 g topically daily as needed (mild pain).    [name] esomeprazole  (NEXIUM) 40 MG capsule Take 40 mg by mouth daily at 12 noon.    . fluticasone (FLONASE) 50 MCG/ACT nasal spray Place 1 spray into both nostrils daily.    . Fluticasone-Salmeterol (ADVAIR) 250-50 MCG/DOSE AEPB Inhale 1 puff into the lungs 2 (two) times daily.    . furosemide (LASIX) 20 MG tablet Take 20 mg by mouth 2 (two) times daily.    [name] lisinopril (PRINIVIL,ZESTRIL) 10 MG tablet Take 1 tablet (10 mg total) by mouth daily. 90 tablet 5  . metoprolol (LOPRESSOR) 50 MG tablet Take 1 tablet by mouth daily.    . nitroGLYCERIN (NITROSTAT) 0.4 MG SL tablet Place 1 tablet (0.4 mg total) under the tongue every 5 (five) minutes as needed. 25 tablet 2  . oxyCODONE-acetaminophen (PERCOCET) 10-325 MG per tablet Take 1 tablet by mouth every 8 (eight) hours as needed for pain.    . polyethylene glycol (MIRALAX / GLYCOLAX) packet Take 17 g by mouth daily.    . rosuvastatin (CRESTOR) 20 MG tablet Take 1 tablet (20 mg total) by mouth daily. 30 tablet 1  . valACYclovir (VALTREX) 500 MG tablet Take 500 mg by mouth daily as needed (fever blisters).    . VESICARE 5 MG tablet Take 5 mg by mouth daily.      No current facility-administered medications on file prior to visit.  No Known Allergies  Past Medical History  Diagnosis Date  . CAD (coronary artery disease) 01/2010    a. NSTEMI 11/2008 s/p DES to LCx. b. NSTEMI 01/2010 secondary to thrombotic RCA lesion, med rx.; negative Myoview in January 2014  . PAD (peripheral artery disease) ([place])     a. Emboli to R foot 2010 from partially occlusive lesion in R EIA, s/p stenting. - followed by Dr. Donnetta Hutching  . Anxiety   . Depression   . Hyperlipidemia   . Tobacco abuse   . Overweight(278.02)   . OP (osteoporosis)   . HTN (hypertension)   . Lumbar disc disease   . CHF (congestive heart failure) ([place])   . Stroke ([place])   . Myocardial infarction ([place])   . TIA (transient ischemic attack)   . Sleep apnea   . Shortness of breath   . GERD (gastroesophageal reflux  disease)   . Headache   . Restless leg   . Dizziness     Past Surgical History  Procedure Laterality Date  . Iliac artery stent      RIGHT ILIAC STENT  . Knee arthroscopy    . Lumbar laminectomy    . Tubal ligation    . Hysterectomy -age 6    . Eye surgery      at age 65    History  Smoking status  . Former Smoker  . Quit date: 11/20/2012  Smokeless tobacco  . Never Used    History  Alcohol Use No    Family History  Problem Relation Age of Onset  . Alzheimer's disease Mother   . Cancer Brother   . Heart disease      Grandfather  . Hepatitis C Brother   . Diabetes Brother   . Thyroid disease Brother   . Hypertension Brother   . Depression Brother     Review of Systems: The review of systems is per the HPI.   All other systems were reviewed and are negative.  Physical Exam: BP 102/60 mmHg  Pulse 66  Ht 5\' 5"  (1.651 m)  Wt 84.142 kg (185 lb 8 oz)  BMI 30.87 kg/m2 Patient is pleasant and in no acute distress. Skin is warm and dry. Color is normal.  HEENT reveals oral thrush with diffusely reddened tongue and white coating the back of tongue.  There is a soft left carotid bruit. Normocephalic/atraumatic. PERRL. Sclera are nonicteric. Neck is supple. No masses. No JVD. Lungs are clear. Cardiac exam shows a regular rate and rhythm. normal S1-2. No gallop or murmur. Abdomen is soft. Extremities are without edema. ROM appears intact. No gross neurologic deficits are noted.  LABORATORY DATA:  Lab Results  Component Value Date   WBC 10.9* 07/12/2014   HGB 13.6 07/12/2014   HCT 43.3 07/12/2014   PLT 316 07/12/2014   GLUCOSE 145* 07/12/2014   CHOL 179 07/13/2014   TRIG 212* 07/13/2014   HDL 36* 07/13/2014   LDLCALC 101* 07/13/2014   ALT 20 07/12/2014   AST 29 07/12/2014   NA 142 07/12/2014   K 4.7 07/12/2014   CL 102 07/12/2014   CREATININE 0.96 07/12/2014   BUN 28* 07/12/2014   CO2 25 07/12/2014   TSH 1.130 07/13/2014   INR 1.06 07/12/2014   HGBA1C  6.1* 07/13/2014     Echo Study Conclusions 07/13/14   Study Conclusions  - Left ventricle: The cavity size was normal. Systolic function was normal. The estimated ejection fraction was in the range of 55%  to 60%. Wall motion was normal; there were no regional wall motion abnormalities. Doppler parameters are consistent with abnormal left ventricular relaxation (grade 1 diastolic dysfunction). - Aortic valve: There was mild regurgitation.    MYOVIEW IMPRESSION:10/16/12  1. Suspected scarring at the cardiac apex, without inducible ischemia. Left ventricular ejection fraction calculated at 62%.   Original Report Authenticated By: Van Clines, M.D.        Last Resulted: 10/16/12 4:04 PM     Ecg today shows NSR rate 66. Normal Ecg.   Assessment / Plan: 1.  CAD - Myoview in 2014 normal. Symptoms are stable. Will continue medical therapy. Follow up in 6 months.  2. Tobacco abuse - now stopped.  3. PAD - chronic leg pain. Remote right iliac stent. Not clearly claudication. Feet well perfused. Last dopplers in May 2014 will update now.  4. S/p right MCA CVA. On ASA and Plavix. No recurrent symptoms.  5. Hyperlipidemia on statin. Labs followed by primary care.  6. Oral thrush. Recommend clotrimazol troche 5x daily for 1-2 weeks. Recommend she reduce lasix dose by half.

## 2015-10-28 NOTE — Patient Instructions (Signed)
Reduce your lasix dose by half.  Take clotrimazole 5 times daily for one to two weeks. Allow to dissolve in mouth.  We will schedule you for lower extremity arterial dopplers.  I will see you in 6 months.

## 2015-11-01 ENCOUNTER — Other Ambulatory Visit: Payer: Self-pay | Admitting: Cardiology

## 2015-11-01 DIAGNOSIS — I5032 Chronic diastolic (congestive) heart failure: Secondary | ICD-10-CM

## 2015-11-01 DIAGNOSIS — I739 Peripheral vascular disease, unspecified: Secondary | ICD-10-CM

## 2015-11-01 DIAGNOSIS — I10 Essential (primary) hypertension: Secondary | ICD-10-CM

## 2015-11-01 DIAGNOSIS — I2510 Atherosclerotic heart disease of native coronary artery without angina pectoris: Secondary | ICD-10-CM

## 2015-11-01 DIAGNOSIS — B370 Candidal stomatitis: Secondary | ICD-10-CM

## 2015-11-03 ENCOUNTER — Ambulatory Visit (HOSPITAL_COMMUNITY)
Admission: RE | Admit: 2015-11-03 | Discharge: 2015-11-03 | Disposition: A | Discharge status: Home / Self Care | Payer: Medicare Other | Source: Ambulatory Visit | Attending: Cardiology | Admitting: Cardiology

## 2015-11-03 DIAGNOSIS — I708 Atherosclerosis of other arteries: Secondary | ICD-10-CM | POA: Insufficient documentation

## 2015-11-03 DIAGNOSIS — I2510 Atherosclerotic heart disease of native coronary artery without angina pectoris: Secondary | ICD-10-CM | POA: Diagnosis not present

## 2015-11-03 DIAGNOSIS — I700 Atherosclerosis of aorta: Secondary | ICD-10-CM | POA: Insufficient documentation

## 2015-11-03 DIAGNOSIS — I5032 Chronic diastolic (congestive) heart failure: Secondary | ICD-10-CM | POA: Diagnosis not present

## 2015-11-03 DIAGNOSIS — I10 Essential (primary) hypertension: Secondary | ICD-10-CM

## 2015-11-03 DIAGNOSIS — I739 Peripheral vascular disease, unspecified: Secondary | ICD-10-CM | POA: Diagnosis not present

## 2015-11-03 DIAGNOSIS — B370 Candidal stomatitis: Secondary | ICD-10-CM

## 2015-11-03 DIAGNOSIS — E785 Hyperlipidemia, unspecified: Secondary | ICD-10-CM | POA: Insufficient documentation

## 2016-05-22 ENCOUNTER — Ambulatory Visit (INDEPENDENT_AMBULATORY_CARE_PROVIDER_SITE_OTHER): Payer: Medicare Other | Admitting: Nurse Practitioner

## 2016-05-22 ENCOUNTER — Encounter: Payer: Self-pay | Admitting: Nurse Practitioner

## 2016-05-22 VITALS — BP 158/80 | HR 69 | Ht 65.0 in | Wt 188.8 lb

## 2016-05-22 DIAGNOSIS — I25119 Atherosclerotic heart disease of native coronary artery with unspecified angina pectoris: Secondary | ICD-10-CM

## 2016-05-22 DIAGNOSIS — I119 Hypertensive heart disease without heart failure: Secondary | ICD-10-CM | POA: Diagnosis not present

## 2016-05-22 DIAGNOSIS — E785 Hyperlipidemia, unspecified: Secondary | ICD-10-CM | POA: Diagnosis not present

## 2016-05-22 DIAGNOSIS — R079 Chest pain, unspecified: Secondary | ICD-10-CM | POA: Diagnosis not present

## 2016-05-22 MED ORDER — LISINOPRIL 20 MG PO TABS: 20.0000 mg | ORAL_TABLET | Freq: Every day | ORAL | 3 refills | Status: DC

## 2016-05-22 MED ORDER — LISINOPRIL 40 MG PO TABS: 40.0000 mg | ORAL_TABLET | Freq: Every day | ORAL | 3 refills | Status: DC

## 2016-05-22 NOTE — Progress Notes (Signed)
Office Visit    Patient Name: Sonya Small Date of Encounter: 05/22/2016  Primary Care Provider:  [name], MD Primary Cardiologist:  P. [name], MD   Chief Complaint    65 year old female with prior history of CAD and peripheral arterial disease, who presents for follow-up.  Past Medical History    Past Medical History:  Diagnosis Date  . Anxiety   . CAD (coronary artery disease)    a. NSTEMI 11/2008 s/p DES to LCx (3.0x12 Xience); b. NSTEMI 01/2010 secondary to thrombotic RCA lesion (non-obstructive)-->med rx (integrilin x 24 hrs + plavix); c. 09/2012 negative Myoview.  . Chronic diastolic CHF (congestive heart failure) ([place])    a. 06/2014 Echo: EF 55-60%, no rwma, Gr1 DD, mild AI.  [name] Depression   . Dizziness   . GERD (gastroesophageal reflux disease)   . Headache   . Hyperlipidemia   . Hypertensive heart disease   . Lumbar disc disease   . Myocardial infarction ([place])   . Obstructive sleep apnea   . OP (osteoporosis)   . Overweight(278.02)   . PAD (peripheral artery disease) ([place])    a. Emboli to R foot 2010 from partially occlusive lesion in R EIA, s/p stenting. - followed by Dr. Donnetta Hutching;  b. 10/2015 ABIs: R 1.03, L 0.97.  [name] Restless leg   . Stroke ([place])   . TIA (transient ischemic attack)   . Tobacco abuse    Past Surgical History:  Procedure Laterality Date  . EYE SURGERY     at age 29  . hysterectomy -age 44    . ILIAC ARTERY STENT     RIGHT ILIAC STENT  . KNEE ARTHROSCOPY    . LUMBAR LAMINECTOMY    . TUBAL LIGATION      Allergies  No Known Allergies  History of Present Illness    65 year old female with the above complex past medical history including CAD status post non-ST segment elevation myocardial infarction in 11/2008 with stenting of the left circumflex, followed by non-STEMI in May 2011, at which time she was noted to have a thrombotic lesion in the right coronary artery, which was nonobstructive. She was treated with aggressive medical therapy.  Her last Myoview was in 2014 and was nonischemic. She also has a history of peripheral arterial disease and is status post right iliac stenting. Following her last visit here in February, she had repeat arterial brachial indices, which were normal. Other history includes hypertension, hyperlipidemia, sleep apnea, and prior right MCA CVA, treated with chronic aspirin and Plavix.  Since her last visit, she has noticed some increase in exertional dyspnea and also mild chest squeezing. This has been occurring for a few months, especially noted when she goes out to feed her dogs. She isn't sure if this is similar to prior angina or not. She also says that she is often tired throughout the day and admits to not sleeping well at night. On nights that she wears CPAP, she is uncomfortable with it and often restless throughout the night. On the nights that she does not wear CPAP she feels as though she gets a better night sleep but is still tired the following day. She also reports bilateral plantar foot pain that is constant for the past 2 months. She does not have a history of diabetes. Pain is somewhat worse with positioning of her feet. Of note, she mostly only wears flip flops or sandals.  Home Medications    Prior to Admission medications  Medication Sig Start Date End Date Taking? Authorizing Provider  Aclidinium Bromide (TUDORZA PRESSAIR) 400 MCG/ACT AEPB Inhale 400 mcg into the lungs.    Historical Provider, MD  albuterol (PROVENTIL HFA;VENTOLIN HFA) 108 (90 BASE) MCG/ACT inhaler Inhale 2 puffs into the lungs every 4 (four) hours as needed for wheezing or shortness of breath.    Historical Provider, MD  ALPRAZolam Duanne Moron) 0.5 MG tablet Take 0.5 mg by mouth 2 (two) times daily as needed.     Historical Provider, MD  aspirin (ASPIRIN EC) 81 MG EC tablet Take 81 mg by mouth daily. Swallow whole.    Historical Provider, MD  buPROPion (WELLBUTRIN XL) 300 MG 24 hr tablet Take 300 mg by mouth daily.      Historical Provider, MD  clopidogrel (PLAVIX) 75 MG tablet Take 1 tablet (75 mg total) by mouth daily. 06/08/13   [name] [name], MD  clotrimazole (MYCELEX) 10 MG troche Take 1 tablet (10 mg total) by mouth 5 (five) times daily. 10/28/15   [name] [name], MD  diclofenac sodium (VOLTAREN) 1 % GEL Apply 2 g topically daily as needed (mild pain).    Historical Provider, MD  esomeprazole (NEXIUM) 40 MG capsule Take 40 mg by mouth daily at 12 noon.    Historical Provider, MD  fluticasone (FLONASE) 50 MCG/ACT nasal spray Place 1 spray into both nostrils daily.    Historical Provider, MD  Fluticasone-Salmeterol (ADVAIR) 250-50 MCG/DOSE AEPB Inhale 1 puff into the lungs 2 (two) times daily.    Historical Provider, MD  furosemide (LASIX) 20 MG tablet Take 20 mg by mouth 2 (two) times daily.    Historical Provider, MD  lisinopril (PRINIVIL,ZESTRIL) 10 MG tablet Take 1 tablet (10 mg total) by mouth daily. 10/30/11   [name] [name], MD  metoprolol (LOPRESSOR) 50 MG tablet Take 1 tablet by mouth daily. 05/17/14   Historical Provider, MD  nitroGLYCERIN (NITROSTAT) 0.4 MG SL tablet Place 1 tablet (0.4 mg total) under the tongue every 5 (five) minutes as needed. 09/26/12   [name] [name], MD  oxyCODONE-acetaminophen (PERCOCET) 10-325 MG per tablet Take 1 tablet by mouth every 8 (eight) hours as needed for pain.    Historical Provider, MD  polyethylene glycol (MIRALAX / GLYCOLAX) packet Take 17 g by mouth daily.    Historical Provider, MD  rosuvastatin (CRESTOR) 20 MG tablet Take 1 tablet (20 mg total) by mouth daily. 07/13/14   Annita Brod, MD  valACYclovir (VALTREX) 500 MG tablet Take 500 mg by mouth daily as needed (fever blisters).    Historical Provider, MD  VESICARE 5 MG tablet Take 5 mg by mouth daily.  04/29/12   Historical Provider, MD    Review of Systems    As above, she has been having some exertional dyspnea and also chest squeezing/tightness. She has bilateral foot pain and daytime somnolence. She  denies palpitations, PND, orthopnea, dizziness, syncope, edema, or early satiety.  All other systems reviewed and are otherwise negative except as noted above.  Physical Exam    VS:  BP (!) 158/80   Pulse 69   Ht 5\' 5"  (1.651 m)   Wt 188 lb 12.8 oz (85.6 kg)   BMI 31.42 kg/m  , BMI Body mass index is 31.42 kg/m. GEN: Well nourished, well developed, in no acute distress.  HEENT: normal.  Neck: Supple, no JVD, carotid bruits, or masses. Cardiac: RRR, no murmurs, rubs, or gallops. No clubbing, cyanosis, edema.  DP/PT 2+ On the right, 1+  on the left. Respiratory:  Respirations regular and unlabored, clear to auscultation bilaterally. GI: Soft, nontender, nondistended, BS + x 4. MS: no deformity or atrophy. Skin: warm and dry, no rash. Neuro:  Strength and sensation are intact. Psych: Normal affect.  Accessory Clinical Findings    ECG - Sinus rhythm, 69, no acute ST or T changes.  Assessment & Plan    1.  Exertional chest tightness/CAD: Patient presented for follow-up today and reports that over the past few months, she's been expressing some exertional dyspnea and also mild chest tightness. She is not sure if this is similar to prior angina. Symptoms occur most days of the week and resolve within a few minutes. She is being treated with aspirin, Plavix, beta blocker, ACE inhibitor, and statin therapy. I will arrange for an exercise Myoview to rule out ischemia and she is agreeable.  2. Hypertensive heart disease: Blood pressure is elevated today at 158/80. She says she was recently in the 160s and that her primary care provider increased her lisinopril from 10-20 mg daily. I will increase her lisinopril to 40 mg daily. She is otherwise on beta blocker and Lasix therapy.  3. Hyperlipidemia: She is on statin therapy and this is managed by primary care.  4. Daytime somnolence: I suspect that this is primarily attributable to restless sleep both in the setting of wearing CPAP and when not  wearing CPAP. She has follow-up with the physician who manages her CPAP soon.  5. Bilateral foot pain involving the plantar surface: Over the past 2 months, she has had constant bilateral foot pain. She says she only wears foot flops or sandals. She has previously had ABIs that were normal in February. She has good distal pulses. I recommended that she invest in a more supportive shoe/sneaker and follow-up with primary care.  6. Disposition: Follow-up stress testing as above. Follow-up with Dr. [name] in 3 months or sooner if necessary pending results.  Murray Hodgkins, NP 05/22/2016, 2:54 PM

## 2016-05-22 NOTE — Patient Instructions (Addendum)
Medication Instructions:  INCREASE lasix to 40mg  take 1 tab by mouth once a day.  Labwork: None Ordered  Testing/Procedures: Your physician has requested that you have en exercise stress myoview. For further information please visit [email or web address]. Please follow instruction sheet, as given.  Follow-Up: Your physician recommends that you schedule a follow-up appointment in: 3 MONTHS with [name]   Any Other Special Instructions Will Be Listed Below (If Applicable).     If you need a refill on your cardiac medications before your next appointment, please call your pharmacy.

## 2016-05-23 ENCOUNTER — Telehealth (HOSPITAL_COMMUNITY): Payer: Self-pay

## 2016-05-23 NOTE — Telephone Encounter (Signed)
Encounter complete. 

## 2016-05-25 ENCOUNTER — Ambulatory Visit (HOSPITAL_COMMUNITY)
Admission: RE | Admit: 2016-05-25 | Discharge: 2016-05-25 | Disposition: A | Discharge status: Home / Self Care | Payer: Medicare Other | Source: Ambulatory Visit | Attending: Nurse Practitioner | Admitting: Nurse Practitioner

## 2016-05-25 DIAGNOSIS — R002 Palpitations: Secondary | ICD-10-CM | POA: Insufficient documentation

## 2016-05-25 DIAGNOSIS — Z87891 Personal history of nicotine dependence: Secondary | ICD-10-CM | POA: Diagnosis not present

## 2016-05-25 DIAGNOSIS — I509 Heart failure, unspecified: Secondary | ICD-10-CM | POA: Diagnosis not present

## 2016-05-25 DIAGNOSIS — G4733 Obstructive sleep apnea (adult) (pediatric): Secondary | ICD-10-CM | POA: Insufficient documentation

## 2016-05-25 DIAGNOSIS — Z6831 Body mass index (BMI) 31.0-31.9, adult: Secondary | ICD-10-CM | POA: Insufficient documentation

## 2016-05-25 DIAGNOSIS — R5383 Other fatigue: Secondary | ICD-10-CM | POA: Insufficient documentation

## 2016-05-25 DIAGNOSIS — I110 Hypertensive heart disease with heart failure: Secondary | ICD-10-CM | POA: Diagnosis not present

## 2016-05-25 DIAGNOSIS — I2510 Atherosclerotic heart disease of native coronary artery without angina pectoris: Secondary | ICD-10-CM | POA: Diagnosis not present

## 2016-05-25 DIAGNOSIS — E669 Obesity, unspecified: Secondary | ICD-10-CM | POA: Insufficient documentation

## 2016-05-25 DIAGNOSIS — R42 Dizziness and giddiness: Secondary | ICD-10-CM | POA: Insufficient documentation

## 2016-05-25 DIAGNOSIS — R0609 Other forms of dyspnea: Secondary | ICD-10-CM | POA: Insufficient documentation

## 2016-05-25 DIAGNOSIS — R079 Chest pain, unspecified: Secondary | ICD-10-CM | POA: Diagnosis present

## 2016-05-25 DIAGNOSIS — R9439 Abnormal result of other cardiovascular function study: Secondary | ICD-10-CM | POA: Diagnosis not present

## 2016-05-25 LAB — MYOCARDIAL PERFUSION IMAGING
CHL CUP NUCLEAR SSS: 6
LVDIAVOL: 82 mL (ref 46–106)
LVSYSVOL: 35 mL
NUC STRESS TID: 1.03
Peak HR: 93 {beats}/min
Rest HR: 74 {beats}/min
SDS: 3
SRS: 3

## 2016-05-25 MED ORDER — TECHNETIUM TC 99M TETROFOSMIN IV KIT
10.4000 | PACK | Freq: Once | INTRAVENOUS | Status: AC | PRN
  Administered 2016-05-25: 10 via INTRAVENOUS
  Filled 2016-05-25: qty 10

## 2016-05-25 MED ORDER — REGADENOSON 0.4 MG/5ML IV SOLN
0.4000 mg | Freq: Once | INTRAVENOUS | Status: AC
  Administered 2016-05-25: 0.4 mg via INTRAVENOUS

## 2016-05-25 MED ORDER — TECHNETIUM TC 99M TETROFOSMIN IV KIT
30.7000 | PACK | Freq: Once | INTRAVENOUS | Status: AC | PRN
  Administered 2016-05-25: 30.7 via INTRAVENOUS
  Filled 2016-05-25: qty 31

## 2016-05-29 ENCOUNTER — Telehealth: Payer: Self-pay | Admitting: Nurse Practitioner

## 2016-05-29 NOTE — Telephone Encounter (Signed)
Follow Up:    Returning call,concerning her stress test results.

## 2016-05-29 NOTE — Telephone Encounter (Signed)
-----   Message from Rogelia Mire, NP sent at 05/25/2016  3:08 PM EDT ----- Study is low risk without evidence for poor blood flow (ischemia).  Overall, reassuring.

## 2016-05-29 NOTE — Telephone Encounter (Signed)
Pt has been made aware of her stress test results and verbalized understanding.

## 2016-07-04 ENCOUNTER — Encounter: Payer: Self-pay | Admitting: Family Medicine

## 2016-07-04 LAB — HM DEXA SCAN

## 2016-09-06 NOTE — Progress Notes (Signed)
Sonya Small Date of Birth: [birth year] Medical Record [id number]  History of Present Illness: [name] is seen for follow up CAD.  She has known CAD with prior NSTEMI back in 2011, PAD s/p iliac stent, COPD and  tobacco abuse.   In January 2014 she was admitted with recurrent chest pain. Cardiac enzymes were negative. Myoview was negative.   She was admitted with acute CVA 10/19-10/20, 2015. She presented with slurred speech, drawing of her mouth and emotional lability. MRI showed an acute right MCA CVA involving the basal ganglia and periventricular white matter. She was started on ASA 81 mg daily along with Plavix. Crestor dose was increased to 20 mg. Echo was unremarkable. Carotids were patent by MRI.   She was seen in August 2017 with symptoms of dyspnea. Myoview study showed no ischemia. EF normal.   On follow up she is doing about the same. Very sedentary. Notes DOE. Denies tobacco use. Not using CPAP. Rare chest pain radiating down left arm. Back and hip pain are unchanged. No thigh or calf claudication.    Current Outpatient Prescriptions on File Prior to Visit  Medication Sig Dispense Refill  . Aclidinium Bromide (TUDORZA PRESSAIR) 400 MCG/ACT AEPB Inhale 400 mcg into the lungs.    [name] albuterol (PROVENTIL HFA;VENTOLIN HFA) 108 (90 BASE) MCG/ACT inhaler Inhale 2 puffs into the lungs every 4 (four) hours as needed for wheezing or shortness of breath.    . ALPRAZolam (XANAX) 0.5 MG tablet Take 0.5 mg by mouth 2 (two) times daily as needed.     [name] aspirin (ASPIRIN EC) 81 MG EC tablet Take 81 mg by mouth daily. Swallow whole.    [name] buPROPion (WELLBUTRIN XL) 300 MG 24 hr tablet Take 300 mg by mouth daily.     . clopidogrel (PLAVIX) 75 MG tablet Take 1 tablet (75 mg total) by mouth daily. 90 tablet 0  . diclofenac sodium (VOLTAREN) 1 % GEL Apply 2 g topically daily as needed (mild pain).    [name] esomeprazole (NEXIUM) 40 MG capsule Take 40 mg by mouth daily at 12 noon.    . fluticasone  (FLONASE) 50 MCG/ACT nasal spray Place 1 spray into both nostrils daily.    . Fluticasone-Salmeterol (ADVAIR) 250-50 MCG/DOSE AEPB Inhale 1 puff into the lungs 2 (two) times daily.    . furosemide (LASIX) 20 MG tablet Take 20 mg by mouth 2 (two) times daily.    . metoprolol (LOPRESSOR) 50 MG tablet Take 1 tablet by mouth daily.    . nitroGLYCERIN (NITROSTAT) 0.4 MG SL tablet Place 1 tablet (0.4 mg total) under the tongue every 5 (five) minutes as needed. 25 tablet 2  . oxyCODONE-acetaminophen (PERCOCET) 10-325 MG per tablet Take 1 tablet by mouth every 8 (eight) hours as needed for pain.    . polyethylene glycol (MIRALAX / GLYCOLAX) packet Take 17 g by mouth daily.    . rosuvastatin (CRESTOR) 20 MG tablet Take 1 tablet (20 mg total) by mouth daily. 30 tablet 1  . valACYclovir (VALTREX) 500 MG tablet Take 500 mg by mouth daily as needed (fever blisters).    . VESICARE 5 MG tablet Take 5 mg by mouth daily.     [name] lisinopril (PRINIVIL,ZESTRIL) 40 MG tablet Take 1 tablet (40 mg total) by mouth daily. 90 tablet 3   No current facility-administered medications on file prior to visit.     No Known Allergies  Past Medical History:  Diagnosis Date  . Anxiety   .  CAD (coronary artery disease)    a. NSTEMI 11/2008 s/p DES to LCx (3.0x12 Xience); b. NSTEMI 01/2010 secondary to thrombotic RCA lesion (non-obstructive)-->med rx (integrilin x 24 hrs + plavix); c. 09/2012 negative Myoview.  . Chronic diastolic CHF (congestive heart failure) ([place])    a. 06/2014 Echo: EF 55-60%, no rwma, Gr1 DD, mild AI.  [name] Depression   . Dizziness   . GERD (gastroesophageal reflux disease)   . Headache   . Hyperlipidemia   . Hypertensive heart disease   . Lumbar disc disease   . Myocardial infarction   . Obstructive sleep apnea   . OP (osteoporosis)   . Overweight(278.02)   . PAD (peripheral artery disease) ([place])    a. Emboli to R foot 2010 from partially occlusive lesion in R EIA, s/p stenting. - followed by Dr.  Donnetta Hutching;  b. 10/2015 ABIs: R 1.03, L 0.97.  [name] Restless leg   . Stroke ([place])   . TIA (transient ischemic attack)   . Tobacco abuse     Past Surgical History:  Procedure Laterality Date  . EYE SURGERY     at age 64  . hysterectomy -age 65    . ILIAC ARTERY STENT     RIGHT ILIAC STENT  . KNEE ARTHROSCOPY    . LUMBAR LAMINECTOMY    . TUBAL LIGATION      History  Smoking Status  . Former Smoker  . Quit date: 11/20/2012  Smokeless Tobacco  . Never Used    History  Alcohol Use No    Family History  Problem Relation Age of Onset  . Alzheimer's disease Mother   . Cancer Brother   . Heart disease      Grandfather  . Hepatitis C Brother   . Diabetes Brother   . Thyroid disease Brother   . Hypertension Brother   . Depression Brother     Review of Systems: The review of systems is per the HPI.   All other systems were reviewed and are negative.  Physical Exam: BP 125/77   Pulse 62   Ht 5\' 5"  (1.651 m)   Wt 188 lb (85.3 kg)   BMI 31.28 kg/m  Patient is pleasant and in no acute distress. Skin is warm and dry. Color is normal.  HEENT reveals oral thrush with diffusely reddened tongue and white coating the back of tongue.  There is a soft left carotid bruit. Normocephalic/atraumatic. PERRL. Sclera are nonicteric. Neck is supple. No masses. No JVD. Lungs are clear. Cardiac exam shows a regular rate and rhythm. normal S1-2. No gallop or murmur. Abdomen is soft. Extremities are without edema. ROM appears intact. No gross neurologic deficits are noted.  LABORATORY DATA:  Lab Results  Component Value Date   WBC 10.9 (H) 07/12/2014   HGB 13.6 07/12/2014   HCT 43.3 07/12/2014   PLT 316 07/12/2014   GLUCOSE 145 (H) 07/12/2014   CHOL 179 07/13/2014   TRIG 212 (H) 07/13/2014   HDL 36 (L) 07/13/2014   LDLCALC 101 (H) 07/13/2014   ALT 20 07/12/2014   AST 29 07/12/2014   NA 142 07/12/2014   K 4.7 07/12/2014   CL 102 07/12/2014   CREATININE 0.96 07/12/2014   BUN 28 (H)  07/12/2014   CO2 25 07/12/2014   TSH 1.130 07/13/2014   INR 1.06 07/12/2014   HGBA1C 6.1 (H) 07/13/2014   Labs dated 06/14/16: cholesterol 142, triglycerides 178, LDL 62, HDL 44. A1c 6.6%. CMET and TSH normal.  Echo  Study Conclusions 07/13/14   Study Conclusions  - Left ventricle: The cavity size was normal. Systolic function was normal. The estimated ejection fraction was in the range of 55% to 60%. Wall motion was normal; there were no regional wall motion abnormalities. Doppler parameters are consistent with abnormal left ventricular relaxation (grade 1 diastolic dysfunction). - Aortic valve: There was mild regurgitation.    MYOVIEW IMPRESSION:10/16/12  1. Suspected scarring at the cardiac apex, without inducible ischemia. Left ventricular ejection fraction calculated at 62%.   Original Report Authenticated By: Van Clines, M.D.        Last Resulted: 10/16/12 4:04 PM      Myoview 05/25/16: Study Highlights     The left ventricular ejection fraction is normal (55-65%).  Nuclear stress EF: 58%.  There was no ST segment deviation noted during stress.  Findings consistent with prior myocardial infarction.  This is a low risk study.   Small apical infarct no ischemia Apical hypokinesis overall normal EF 58%  Evidence for LVH    Assessment / Plan: 1.  CAD - Myoview in September was normal. Symptoms are stable. Will continue medical therapy. Follow up in 6 months.  2. Tobacco abuse - now stopped.  3. PAD - chronic leg pain. Remote right iliac stent. Not clearly claudication. A lot of arthritic complaints. Feet well perfused. Last dopplers in February 2017. Encourage increased walking.  4. S/p right MCA CVA. On ASA and Plavix. No recurrent symptoms.  5. Hyperlipidemia on statin. Good control.  6. Hypertensive heart disease without CHF.

## 2016-09-07 ENCOUNTER — Ambulatory Visit (INDEPENDENT_AMBULATORY_CARE_PROVIDER_SITE_OTHER): Payer: Medicare Other | Admitting: Cardiology

## 2016-09-07 ENCOUNTER — Encounter: Payer: Self-pay | Admitting: Cardiology

## 2016-09-07 VITALS — BP 125/77 | HR 62 | Ht 65.0 in | Wt 188.0 lb

## 2016-09-07 DIAGNOSIS — E7800 Pure hypercholesterolemia, unspecified: Secondary | ICD-10-CM | POA: Diagnosis not present

## 2016-09-07 DIAGNOSIS — I119 Hypertensive heart disease without heart failure: Secondary | ICD-10-CM

## 2016-09-07 DIAGNOSIS — I10 Essential (primary) hypertension: Secondary | ICD-10-CM | POA: Diagnosis not present

## 2016-09-07 DIAGNOSIS — I739 Peripheral vascular disease, unspecified: Secondary | ICD-10-CM | POA: Diagnosis not present

## 2016-09-07 DIAGNOSIS — I209 Angina pectoris, unspecified: Secondary | ICD-10-CM | POA: Diagnosis not present

## 2016-09-07 DIAGNOSIS — E782 Mixed hyperlipidemia: Secondary | ICD-10-CM

## 2016-09-07 DIAGNOSIS — I25119 Atherosclerotic heart disease of native coronary artery with unspecified angina pectoris: Secondary | ICD-10-CM

## 2016-09-07 NOTE — Patient Instructions (Signed)
Continue your current therapy  I will see you in 6 months.   

## 2017-03-06 NOTE — Progress Notes (Signed)
[name] Date of Birth: 1951/01/06 Medical Record #[id number]  History of Present Illness: Ms. Audi is seen for follow up CAD.  She has known CAD with prior NSTEMI back in 2011, PAD s/p remote iliac stent, COPD and  tobacco abuse.   In January 2014 she was admitted with recurrent chest pain. Cardiac enzymes were negative. Myoview was negative.   She was admitted with acute CVA 10/19-10/20/15. She presented with slurred speech, drawing of her mouth and emotional lability. MRI showed an acute right MCA CVA involving the basal ganglia and periventricular white matter. She was started on ASA 81 mg daily along with Plavix. Crestor dose was increased to 20 mg. Echo was unremarkable. Carotids were patent by MRI.   She was seen in August 2017 with symptoms of dyspnea. Myoview study showed no ischemia. EF normal.   On follow up she is doing about the same. Very sedentary. Notes DOE with any activity. She is back smoking some.  Unable to tolerate CPAP. States she hurts all over- arms, legs, chest, and back.   Current Outpatient Prescriptions on File Prior to Visit  Medication Sig Dispense Refill  . Aclidinium Bromide (TUDORZA PRESSAIR) 400 MCG/ACT AEPB Inhale 400 mcg into the lungs.    [name] albuterol (PROVENTIL HFA;VENTOLIN HFA) 108 (90 BASE) MCG/ACT inhaler Inhale 2 puffs into the lungs every 4 (four) hours as needed for wheezing or shortness of breath.    . ALPRAZolam (XANAX) 0.5 MG tablet Take 0.5 mg by mouth 2 (two) times daily as needed.     [name] aspirin (ASPIRIN EC) 81 MG EC tablet Take 81 mg by mouth daily. Swallow whole.    [name] buPROPion (WELLBUTRIN XL) 300 MG 24 hr tablet Take 300 mg by mouth daily.     . clopidogrel (PLAVIX) 75 MG tablet Take 1 tablet (75 mg total) by mouth daily. 90 tablet 0  . diclofenac sodium (VOLTAREN) 1 % GEL Apply 2 g topically daily as needed (mild pain).    [name] esomeprazole (NEXIUM) 40 MG capsule Take 40 mg by mouth daily at 12 noon.    . fluticasone (FLONASE) 50  MCG/ACT nasal spray Place 1 spray into both nostrils daily.    . Fluticasone-Salmeterol (ADVAIR) 250-50 MCG/DOSE AEPB Inhale 1 puff into the lungs 2 (two) times daily.    . furosemide (LASIX) 20 MG tablet Take 20 mg by mouth 2 (two) times daily.    . metoprolol (LOPRESSOR) 50 MG tablet Take 1 tablet by mouth daily.    . nitroGLYCERIN (NITROSTAT) 0.4 MG SL tablet Place 1 tablet (0.4 mg total) under the tongue every 5 (five) minutes as needed. 25 tablet 2  . oxyCODONE-acetaminophen (PERCOCET) 10-325 MG per tablet Take 1 tablet by mouth every 8 (eight) hours as needed for pain.    . polyethylene glycol (MIRALAX / GLYCOLAX) packet Take 17 g by mouth daily.    . rosuvastatin (CRESTOR) 20 MG tablet Take 1 tablet (20 mg total) by mouth daily. 30 tablet 1  . tiZANidine (ZANAFLEX) 4 MG tablet Take 4 mg by mouth 2 (two) times daily.    . valACYclovir (VALTREX) 500 MG tablet Take 500 mg by mouth daily as needed (fever blisters).    [name] lisinopril (PRINIVIL,ZESTRIL) 40 MG tablet Take 1 tablet (40 mg total) by mouth daily. 90 tablet 3   No current facility-administered medications on file prior to visit.     No Known Allergies  Past Medical History:  Diagnosis Date  . Anxiety   .  CAD (coronary artery disease)    a. NSTEMI 11/2008 s/p DES to LCx (3.0x12 Xience); b. NSTEMI 01/2010 secondary to thrombotic RCA lesion (non-obstructive)-->med rx (integrilin x 24 hrs + plavix); c. 09/2012 negative Myoview.  . Chronic diastolic CHF (congestive heart failure) ([place])    a. 06/2014 Echo: EF 55-60%, no rwma, Gr1 DD, mild AI.  [name] Depression   . Dizziness   . GERD (gastroesophageal reflux disease)   . Headache   . Hyperlipidemia   . Hypertensive heart disease   . Lumbar disc disease   . Myocardial infarction ([place])   . Obstructive sleep apnea   . OP (osteoporosis)   . Overweight(278.02)   . PAD (peripheral artery disease) ([place])    a. Emboli to R foot 2010 from partially occlusive lesion in R EIA, s/p stenting. -  followed by Dr. Donnetta Hutching;  b. 10/2015 ABIs: R 1.03, L 0.97.  [name] Restless leg   . Stroke ([place])   . TIA (transient ischemic attack)   . Tobacco abuse     Past Surgical History:  Procedure Laterality Date  . EYE SURGERY     at age 56  . hysterectomy -age 72    . ILIAC ARTERY STENT     RIGHT ILIAC STENT  . KNEE ARTHROSCOPY    . LUMBAR LAMINECTOMY    . TUBAL LIGATION      History  Smoking Status  . Former Smoker  . Quit date: 11/20/2012  Smokeless Tobacco  . Never Used    History  Alcohol Use No    Family History  Problem Relation Age of Onset  . Alzheimer's disease Mother   . Cancer Brother   . Heart disease Unknown        Grandfather  . Hepatitis C Brother   . Diabetes Brother   . Thyroid disease Brother   . Hypertension Brother   . Depression Brother     Review of Systems: The review of systems is per the HPI.   All other systems were reviewed and are negative.  Physical Exam: BP 110/68   Pulse 70   Ht 5\' 5"  (1.651 m)   Wt 185 lb 6.4 oz (84.1 kg)   BMI 30.85 kg/m  Patient is pleasant and in no acute distress. Skin is warm and dry. Color is normal.  HEENT is normal.   There is a soft left carotid bruit. Normocephalic/atraumatic. PERRL. Sclera are nonicteric. Neck is supple. No masses. No JVD. Lungs are clear. Cardiac exam shows a regular rate and rhythm. normal S1-2. No gallop or murmur. Abdomen is soft. Extremities are without edema. Feet are warm and dry. Pedal pulses are palpable.  ROM appears intact. No gross neurologic deficits are noted.  LABORATORY DATA:  Lab Results  Component Value Date   WBC 10.9 (H) 07/12/2014   HGB 13.6 07/12/2014   HCT 43.3 07/12/2014   PLT 316 07/12/2014   GLUCOSE 145 (H) 07/12/2014   CHOL 179 07/13/2014   TRIG 212 (H) 07/13/2014   HDL 36 (L) 07/13/2014   LDLCALC 101 (H) 07/13/2014   ALT 20 07/12/2014   AST 29 07/12/2014   NA 142 07/12/2014   K 4.7 07/12/2014   CL 102 07/12/2014   CREATININE 0.96 07/12/2014   BUN 28 (H)  07/12/2014   CO2 25 07/12/2014   TSH 1.130 07/13/2014   INR 1.06 07/12/2014   HGBA1C 6.1 (H) 07/13/2014   Labs dated 12/26/16: cholesterol 143, triglycerides 199, LDL 68, HDL 35. A1c 6.9%. CMET  normal.    Assessment / Plan: 1.  CAD - Myoview in September 2017 was normal. Symptoms are stable. Will continue medical therapy. Follow up in 6 months.  2. Tobacco abuse - now smoking some again. Recommend complete cessation.  3. PAD - chronic leg pain. Not clearly ischemic. Remote external right iliac stent. Not clearly claudication. A lot of arthritic complaints. Feet well perfused. Last dopplers in February 2017. Recommend regular walking program. Will update vascular dopplers of LE and iliacs. If there is evidence of progression will refer to vascular.  4. S/p right MCA CVA. On ASA and Plavix. No recurrent symptoms.  5. Hyperlipidemia on statin. Good control.  6. Hypertensive heart disease without CHF. BP is well controlled.

## 2017-03-07 ENCOUNTER — Encounter: Payer: Self-pay | Admitting: Cardiology

## 2017-03-07 ENCOUNTER — Ambulatory Visit (INDEPENDENT_AMBULATORY_CARE_PROVIDER_SITE_OTHER): Payer: Medicare Other | Admitting: Cardiology

## 2017-03-07 VITALS — BP 110/68 | HR 70 | Ht 65.0 in | Wt 185.4 lb

## 2017-03-07 DIAGNOSIS — I739 Peripheral vascular disease, unspecified: Secondary | ICD-10-CM | POA: Diagnosis not present

## 2017-03-07 DIAGNOSIS — I25119 Atherosclerotic heart disease of native coronary artery with unspecified angina pectoris: Secondary | ICD-10-CM | POA: Diagnosis not present

## 2017-03-07 DIAGNOSIS — E7800 Pure hypercholesterolemia, unspecified: Secondary | ICD-10-CM

## 2017-03-07 DIAGNOSIS — I10 Essential (primary) hypertension: Secondary | ICD-10-CM | POA: Diagnosis not present

## 2017-03-07 NOTE — Patient Instructions (Signed)
Continue your current therapy  We will schedule you for dopplers of your legs.  Quit smoking

## 2017-03-18 ENCOUNTER — Other Ambulatory Visit: Payer: Self-pay | Admitting: Cardiology

## 2017-03-18 DIAGNOSIS — I739 Peripheral vascular disease, unspecified: Secondary | ICD-10-CM

## 2017-04-12 ENCOUNTER — Ambulatory Visit (HOSPITAL_COMMUNITY)
Admission: RE | Admit: 2017-04-12 | Discharge: 2017-04-12 | Disposition: A | Discharge status: Home / Self Care | Payer: Medicare Other | Source: Ambulatory Visit | Attending: Cardiology | Admitting: Cardiology

## 2017-04-12 ENCOUNTER — Ambulatory Visit (HOSPITAL_COMMUNITY)
Admission: RE | Admit: 2017-04-12 | Discharge: 2017-04-12 | Disposition: A | Discharge status: Home / Self Care | Payer: Medicare Other | Source: Ambulatory Visit | Attending: Cardiovascular Disease | Admitting: Cardiovascular Disease

## 2017-04-12 DIAGNOSIS — I739 Peripheral vascular disease, unspecified: Secondary | ICD-10-CM | POA: Insufficient documentation

## 2017-04-12 DIAGNOSIS — Z87891 Personal history of nicotine dependence: Secondary | ICD-10-CM | POA: Diagnosis not present

## 2017-04-12 DIAGNOSIS — Z8673 Personal history of transient ischemic attack (TIA), and cerebral infarction without residual deficits: Secondary | ICD-10-CM | POA: Insufficient documentation

## 2017-04-12 DIAGNOSIS — I10 Essential (primary) hypertension: Secondary | ICD-10-CM | POA: Diagnosis not present

## 2017-04-12 DIAGNOSIS — E7800 Pure hypercholesterolemia, unspecified: Secondary | ICD-10-CM | POA: Diagnosis not present

## 2017-04-12 DIAGNOSIS — I25119 Atherosclerotic heart disease of native coronary artery with unspecified angina pectoris: Secondary | ICD-10-CM | POA: Diagnosis not present

## 2017-04-12 DIAGNOSIS — J449 Chronic obstructive pulmonary disease, unspecified: Secondary | ICD-10-CM | POA: Diagnosis not present

## 2017-06-26 ENCOUNTER — Other Ambulatory Visit: Payer: Self-pay | Admitting: *Deleted

## 2017-06-26 MED ORDER — LISINOPRIL 40 MG PO TABS: 40.0000 mg | ORAL_TABLET | Freq: Every day | ORAL | 3 refills | Status: DC

## 2017-08-30 ENCOUNTER — Other Ambulatory Visit: Payer: Self-pay | Admitting: Orthopaedic Surgery

## 2017-08-30 DIAGNOSIS — M545 Low back pain: Secondary | ICD-10-CM

## 2017-09-15 ENCOUNTER — Ambulatory Visit
Admission: RE | Admit: 2017-09-15 | Discharge: 2017-09-15 | Disposition: A | Discharge status: Home / Self Care | Payer: Medicare Other | Source: Ambulatory Visit | Attending: Orthopaedic Surgery | Admitting: Orthopaedic Surgery

## 2017-09-15 DIAGNOSIS — M545 Low back pain: Secondary | ICD-10-CM

## 2018-03-12 ENCOUNTER — Other Ambulatory Visit: Payer: Self-pay

## 2018-03-12 ENCOUNTER — Telehealth: Payer: Self-pay | Admitting: Cardiology

## 2018-03-12 ENCOUNTER — Emergency Department (HOSPITAL_COMMUNITY): Payer: Medicare Other

## 2018-03-12 ENCOUNTER — Encounter (HOSPITAL_COMMUNITY): Payer: Self-pay

## 2018-03-12 ENCOUNTER — Emergency Department (HOSPITAL_COMMUNITY)
Admission: EM | Admit: 2018-03-12 | Discharge: 2018-03-12 | Disposition: A | Discharge status: Home / Self Care | Payer: Medicare Other | Attending: Emergency Medicine | Admitting: Emergency Medicine

## 2018-03-12 DIAGNOSIS — I739 Peripheral vascular disease, unspecified: Secondary | ICD-10-CM | POA: Insufficient documentation

## 2018-03-12 DIAGNOSIS — Z8673 Personal history of transient ischemic attack (TIA), and cerebral infarction without residual deficits: Secondary | ICD-10-CM | POA: Diagnosis not present

## 2018-03-12 DIAGNOSIS — Z7901 Long term (current) use of anticoagulants: Secondary | ICD-10-CM | POA: Diagnosis not present

## 2018-03-12 DIAGNOSIS — I5032 Chronic diastolic (congestive) heart failure: Secondary | ICD-10-CM | POA: Diagnosis not present

## 2018-03-12 DIAGNOSIS — R079 Chest pain, unspecified: Secondary | ICD-10-CM | POA: Diagnosis not present

## 2018-03-12 DIAGNOSIS — I110 Hypertensive heart disease with heart failure: Secondary | ICD-10-CM | POA: Insufficient documentation

## 2018-03-12 DIAGNOSIS — R9431 Abnormal electrocardiogram [ECG] [EKG]: Secondary | ICD-10-CM | POA: Diagnosis present

## 2018-03-12 DIAGNOSIS — Z79899 Other long term (current) drug therapy: Secondary | ICD-10-CM | POA: Insufficient documentation

## 2018-03-12 DIAGNOSIS — Z7982 Long term (current) use of aspirin: Secondary | ICD-10-CM | POA: Diagnosis not present

## 2018-03-12 DIAGNOSIS — J449 Chronic obstructive pulmonary disease, unspecified: Secondary | ICD-10-CM | POA: Insufficient documentation

## 2018-03-12 LAB — CBC
HCT: 47.8 % — ABNORMAL HIGH (ref 36.0–46.0)
Hemoglobin: 14.6 g/dL (ref 12.0–15.0)
MCH: 27.7 pg (ref 26.0–34.0)
MCHC: 30.5 g/dL (ref 30.0–36.0)
MCV: 90.7 fL (ref 78.0–100.0)
PLATELETS: 263 10*3/uL (ref 150–400)
RBC: 5.27 MIL/uL — ABNORMAL HIGH (ref 3.87–5.11)
RDW: 13.4 % (ref 11.5–15.5)
WBC: 10.9 10*3/uL — AB (ref 4.0–10.5)

## 2018-03-12 LAB — BASIC METABOLIC PANEL
ANION GAP: 13 (ref 5–15)
BUN: 12 mg/dL (ref 6–20)
CALCIUM: 8.9 mg/dL (ref 8.9–10.3)
CO2: 28 mmol/L (ref 22–32)
Chloride: 104 mmol/L (ref 101–111)
Creatinine, Ser: 0.64 mg/dL (ref 0.44–1.00)
GFR calc Af Amer: >60 mL/min (ref >60)
GLUCOSE: 132 mg/dL — AB (ref 65–99)
Potassium: 3.3 mmol/L — ABNORMAL LOW (ref 3.5–5.1)
SODIUM: 145 mmol/L (ref 135–145)

## 2018-03-12 LAB — I-STAT TROPONIN, ED: TROPONIN I, POC: 0 ng/mL (ref 0.00–0.08)

## 2018-03-12 LAB — CBG MONITORING, ED: Glucose-Capillary: 116 mg/dL — ABNORMAL HIGH (ref 65–99)

## 2018-03-12 NOTE — Telephone Encounter (Addendum)
Dr Oval Linsey spoke with Dr [name] regarding patient Per Dr [name] patient had been having intermittent chest pain for the last three days and getting worse. Dr Oval Linsey recommended patient go to ED for evaluation. If patient refused could get Troponin level and send later if elevated.

## 2018-03-12 NOTE — ED Provider Notes (Signed)
[facility] EMERGENCY DEPARTMENT Provider Note   CSN: [id number] Arrival date & time: 03/12/18  1325     History   Chief Complaint Chief Complaint  Patient presents with  . Abnormal ECG  . Chest Pain    HPI Sonya Small is a 67 y.o. female who was sent to the emergency department for evaluation by her primary care physician.  Patient states that 2weeks ago she had an episode of sudden onset crushing retrosternal chest pain with associated nausea, diaphoresis and shortness of breath lasting 5 minutes that resolved with a single nitroglycerin.  She told her PCP today when she was at a follow-up for being newly started on Zoloft about the incident.  She states she is not very sure why she is here except that they got an EKG and her primary care doctor talked to Dr. Doug Sou office and she was told she needed to come to the ER.  She has a history of coronary artery disease and previous heart attack with drug-eluting stent placement, CHF, COPD, sleep apnea and continues to smoke daily.  She has a previous history of what sounds like stable angina.  She states that for instance when she walks her dog at the end of the walk she has palpitations and chest pain that resolved with rest.  This also occurs when she walks back up the hill to her house from her daughter's house.  She states that this is been the same for years and unchanged.  The only abnormal event she had was what occurred 2 weeks ago.  I did call her cardiology office and spoke with the nurse who works with Dr. Minerva Ends who states that her PCP told them she was having increasing episodes of chest pain.  Patient denies any of that here. Patient also denies any current chest pain, worsening in her baseline anginal symptoms, cough, fevers, chills, unilateral leg swelling or history of DVT or pulmonary embolus.  HPI  Past Medical History:  Diagnosis Date  . Anxiety   . CAD (coronary artery disease)    a. NSTEMI  11/2008 s/p DES to LCx (3.0x12 Xience); b. NSTEMI 01/2010 secondary to thrombotic RCA lesion (non-obstructive)-->med rx (integrilin x 24 hrs + plavix); c. 09/2012 negative Myoview.  . Chronic diastolic CHF (congestive heart failure) ([place])    a. 06/2014 Echo: EF 55-60%, no rwma, Gr1 DD, mild AI.  [name] Depression   . Dizziness   . GERD (gastroesophageal reflux disease)   . Headache   . Hyperlipidemia   . Hypertensive heart disease   . Lumbar disc disease   . Myocardial infarction ([place])   . Obstructive sleep apnea   . OP (osteoporosis)   . Overweight(278.02)   . PAD (peripheral artery disease) ([place])    a. Emboli to R foot 2010 from partially occlusive lesion in R EIA, s/p stenting. - followed by Dr. Donnetta Hutching;  b. 10/2015 ABIs: R 1.03, L 0.97.  [name] Restless leg   . Stroke ([place])   . TIA (transient ischemic attack)   . Tobacco abuse     Patient Active Problem List   Diagnosis Date Noted  . Hypertensive heart disease   . Oral thrush 10/28/2015  . Pituitary adenoma ([place]) 02/22/2015  . Cerebral infarction due to thrombosis of left middle cerebral artery ([place]) 08/23/2014  . HLD (hyperlipidemia) 08/23/2014  . Essential hypertension 08/23/2014  . PVD (peripheral vascular disease) ([place]) 08/23/2014  . Former smoker 08/23/2014  . COPD (chronic obstructive pulmonary  disease) ([place]) 07/13/2014  . OSA (obstructive sleep apnea) 07/13/2014  . Chronic diastolic heart failure ([place]) 07/13/2014  . Stroke ([place]) 07/12/2014  . HTN (hypertension) 07/12/2014  . Peripheral vascular disease, unspecified ([place]) 02/17/2013  . Aftercare following surgery of the circulatory system, [place] 02/17/2013  . Chest pain 10/16/2012  . Coronary artery disease   . PAD (peripheral artery disease) ([place])   . Hyperlipidemia   . Tobacco abuse   . History of myocardial infarction 01/22/2010    Past Surgical History:  Procedure Laterality Date  . EYE SURGERY     at age 13  . hysterectomy -age 22    . ILIAC ARTERY STENT     RIGHT  ILIAC STENT  . KNEE ARTHROSCOPY    . LUMBAR LAMINECTOMY    . TUBAL LIGATION       OB History   None      Home Medications    Prior to Admission medications   Medication Sig Start Date End Date Taking? Authorizing Provider  Aclidinium Bromide (TUDORZA PRESSAIR) 400 MCG/ACT AEPB Inhale 400 mcg into the lungs.    [provider]  albuterol (PROVENTIL HFA;VENTOLIN HFA) 108 (90 BASE) MCG/ACT inhaler Inhale 2 puffs into the lungs every 4 (four) hours as needed for wheezing or shortness of breath.    [provider]  ALPRAZolam Duanne Moron) 0.5 MG tablet Take 0.5 mg by mouth 2 (two) times daily as needed.     [provider]  aspirin (ASPIRIN EC) 81 MG EC tablet Take 81 mg by mouth daily. Swallow whole.    [provider]  buPROPion (WELLBUTRIN XL) 300 MG 24 hr tablet Take 300 mg by mouth daily.     [provider]  clopidogrel (PLAVIX) 75 MG tablet Take 1 tablet (75 mg total) by mouth daily. 06/08/13   [name], [name], MD  diclofenac sodium (VOLTAREN) 1 % GEL Apply 2 g topically daily as needed (mild pain).    [provider]  esomeprazole (NEXIUM) 40 MG capsule Take 40 mg by mouth daily at 12 noon.    [provider]  fluticasone (FLONASE) 50 MCG/ACT nasal spray Place 1 spray into both nostrils daily.    [provider]  Fluticasone-Salmeterol (ADVAIR) 250-50 MCG/DOSE AEPB Inhale 1 puff into the lungs 2 (two) times daily.    [provider]  furosemide (LASIX) 20 MG tablet Take 20 mg by mouth 2 (two) times daily.    [provider]  lisinopril (PRINIVIL,ZESTRIL) 40 MG tablet Take 1 tablet (40 mg total) by mouth daily. 06/26/17 09/24/17  [name], [name], MD  metoprolol (LOPRESSOR) 50 MG tablet Take 1 tablet by mouth daily. 05/17/14   [provider]  nitroGLYCERIN (NITROSTAT) 0.4 MG SL tablet Place 1 tablet (0.4 mg total) under the tongue every 5 (five) minutes as needed. 09/26/12   [name], [name], MD    oxyCODONE-acetaminophen (PERCOCET) 10-325 MG per tablet Take 1 tablet by mouth every 8 (eight) hours as needed for pain.    [provider]  polyethylene glycol (MIRALAX / GLYCOLAX) packet Take 17 g by mouth daily.    [provider]  rosuvastatin (CRESTOR) 20 MG tablet Take 1 tablet (20 mg total) by mouth daily. 07/13/14   Annita Brod, MD  tiZANidine (ZANAFLEX) 4 MG tablet Take 4 mg by mouth 2 (two) times daily. 09/06/16   [provider]  valACYclovir (VALTREX) 500 MG tablet Take 500 mg by mouth daily as needed (fever blisters).  [provider]  VASCEPA 1 g CAPS Take 1 capsule by mouth daily. 01/28/17   [provider]  VESICARE 10 MG tablet Take 10 tablets by mouth daily. 02/11/17   [provider]    Family History Family History  Problem Relation Age of Onset  . Alzheimer's disease Mother   . Cancer Brother   . Heart disease Unknown        Grandfather  . Hepatitis C Brother   . Diabetes Brother   . Thyroid disease Brother   . Hypertension Brother   . Depression Brother     Social History Social History   Tobacco Use  . Smoking status: Former Smoker    Last attempt to quit: 11/20/2012    Years since quitting: 5.3  . Smokeless tobacco: Never Used  Substance Use Topics  . Alcohol use: No    Alcohol/week: 0.0 oz  . Drug use: No     Allergies   Patient has no known allergies.   Review of Systems Review of Systems Ten systems reviewed and are negative for acute change, except as noted in the HPI.    Physical Exam Updated Vital Signs BP (!) 168/80 (BP Location: Left Arm)   Pulse 62   Temp 98.3 F (36.8 C) (Oral)   Resp 12   Ht 5\' 5"  (1.651 m)   Wt 84.4 kg (186 lb)   SpO2 97%   BMI 30.95 kg/m   Physical Exam  Constitutional: She is oriented to person, place, and time. She appears well-developed and well-nourished. No distress.  HENT:  Head: Normocephalic and atraumatic.  Eyes: Conjunctivae are  normal. No scleral icterus.  Neck: Normal range of motion.  Cardiovascular: Normal rate, regular rhythm and normal heart sounds. Exam reveals no gallop and no friction rub.  No murmur heard. Pulmonary/Chest: Effort normal and breath sounds normal. No respiratory distress.  Abdominal: Soft. Bowel sounds are normal. She exhibits no distension and no mass. There is no tenderness. There is no guarding.  Neurological: She is alert and oriented to person, place, and time.  Skin: Skin is warm and dry. She is not diaphoretic.  Psychiatric: Her behavior is normal.  Nursing note and vitals reviewed.    ED Treatments / Results  Labs (all labs ordered are listed, but only abnormal results are displayed) Labs Reviewed  BASIC METABOLIC PANEL - Abnormal; Notable for the following components:      Result Value   Potassium 3.3 (*)    Glucose, Bld 132 (*)    All other components within normal limits  CBC - Abnormal; Notable for the following components:   WBC 10.9 (*)    RBC 5.27 (*)    HCT 47.8 (*)    All other components within normal limits  I-STAT TROPONIN, ED    EKG EKG Interpretation  Date/Time:  Wednesday March 12 2018 13:34:32 EDT Ventricular Rate:  64 PR Interval:  158 QRS Duration: 86 QT Interval:  432 QTC Calculation: 445 R Axis:   26 Text Interpretation:  Normal sinus rhythm Anterior infarct , age undetermined Abnormal ECG Confirmed by Quintella Reichert [phone]) on 03/12/2018 3:51:38 PM   Radiology Dg Chest 2 View  Result Date: 03/12/2018 CLINICAL DATA:  Chest pain.  Shortness of breath.  Abnormal EKG. EXAM: CHEST - 2 VIEW COMPARISON:  Chest x-rays dated 07/11/2014 and 12/02/2008 FINDINGS: The heart size and pulmonary vascularity are normal and the lungs are clear. No effusions. Chronic accentuation of the thoracic kyphosis. IMPRESSION: No  active cardiopulmonary disease. Electronically Signed   By: Lorriane Shire M.D.   On: 03/12/2018 14:45    Procedures Procedures (including  critical care time)  Medications Ordered in ED Medications - No data to display   Initial Impression / Assessment and Plan / ED Course  I have reviewed the triage vital signs and the nursing notes.  Pertinent labs & imaging results that were available during my care of the patient were reviewed by me and considered in my medical decision making (see chart for details).  Clinical Course as of Mar 12 1637  Wed Mar 12, 2018  1600 I spoke with Alvina Filbert, LPN working with Dr. Oval Linsey at [facility] cardiology division.  Apparently they were under the impression per the conversation with Dr. [name] that the patient had had worsening anginal symptoms and had active chest pain at the office.  According to the patient that is not what she stated.  During my evaluation she notes stable anginal symptoms except for the single episode that occurred 2 weeks ago.   [AH]    Clinical Course User Index [AH] Margarita Mail, PA-C    Patient EKG unchanged from previous.  She is noted to be mildly hypertensive here but states that she did take her regular antihypertensives today.  Chest x-ray reviewed and labs reviewed.  Her initial troponin is negative.  I did discuss the case with Dr. Angelena Form who is on-call for cardiology today who feels that no emergent work-up is further needed.  Dr. Ralene Bathe and I are in agreement with this assessment and feel she needs close outpatient follow-up.  He has sent a message to his partner Dr. [name] to have the patient seen within the week.  I advised the patient that should she had a repeat episode of the pain she described 2 weeks ago that she should call 911 and be evaluated in the ER at that time.  She is in agreement with plan for discharge and close outpatient follow-up and understands reasons to return to the emergency department.  Final Clinical Impressions(s) / ED Diagnoses   Final diagnoses:  Chest pain, unspecified type    ED Discharge Orders    None        Margarita Mail, PA-C 03/12/18 1640    Quintella Reichert, MD 03/19/18 [phone]

## 2018-03-12 NOTE — Discharge Instructions (Addendum)
If you have another episode of severe pain, please return to the emergency department for evaluation at that time. Otherwise follow-up with Dr. [name] this week for evaluation of your heart.   Your caregiver has diagnosed you as having chest pain that is not specific for one problem, but does not require admission.  You are at low risk for an acute heart condition or other serious illness. Chest pain comes from many different causes.  SEEK IMMEDIATE MEDICAL ATTENTION IF: You have severe chest pain, especially if the pain is crushing or pressure-like and spreads to the arms, back, neck, or jaw, or if you have sweating, nausea (feeling sick to your stomach), or shortness of breath. THIS IS AN EMERGENCY. Don't wait to see if the pain will go away. Get medical help at once. Call 911 or 0 (operator). DO NOT drive yourself to the hospital.  Your chest pain gets worse and does not go away with rest.  You have an attack of chest pain lasting longer than usual, despite rest and treatment with the medications your caregiver has prescribed.  You wake from sleep with chest pain or shortness of breath.  You feel dizzy or faint.  You have chest pain not typical of your usual pain for which you originally saw your caregiver.

## 2018-03-12 NOTE — ED Triage Notes (Signed)
Pt sent here by pcp for abnormal ekg. Pt states that she was having chest pain with radiation down both arms. Still having it intermittently with shob. Denies dizziness n/v. VSS

## 2018-03-13 ENCOUNTER — Ambulatory Visit (INDEPENDENT_AMBULATORY_CARE_PROVIDER_SITE_OTHER): Payer: Medicare Other | Admitting: Physician Assistant

## 2018-03-13 ENCOUNTER — Encounter: Payer: Self-pay | Admitting: Physician Assistant

## 2018-03-13 VITALS — BP 173/73 | HR 64 | Ht 65.0 in | Wt 185.4 lb

## 2018-03-13 DIAGNOSIS — R079 Chest pain, unspecified: Secondary | ICD-10-CM

## 2018-03-13 DIAGNOSIS — E785 Hyperlipidemia, unspecified: Secondary | ICD-10-CM

## 2018-03-13 DIAGNOSIS — I5032 Chronic diastolic (congestive) heart failure: Secondary | ICD-10-CM | POA: Diagnosis not present

## 2018-03-13 DIAGNOSIS — I10 Essential (primary) hypertension: Secondary | ICD-10-CM

## 2018-03-13 DIAGNOSIS — I2510 Atherosclerotic heart disease of native coronary artery without angina pectoris: Secondary | ICD-10-CM

## 2018-03-13 NOTE — Patient Instructions (Addendum)
Isaac Laud PA has ordered a Exercise Myocardial Perfusion Imaging Study. Instructions noted below.   Isaac Laud PA has recommended to take metoprolol tartrate 25mg  twice daily (half of the 50mg  tablet twice daily)   It is recommended that you schedule a follow-up appointment in [place] with Isaac Laud PA on a day Dr. [name] is in the office    The test will take approximately 3 to 4 hours to complete; you may bring reading material.  If someone comes with you to your appointment, they will need to remain in the main lobby due to limited space in the testing area.   You will need to hold the following medications prior to your stress test: beta-blockers (24 hours prior to test)   How to prepare for your Myocardial Perfusion Test:  Do not eat or drink 3 hours prior to your test, except you may have water.  Do not consume products containing caffeine (regular or decaffeinated) 12 hours prior to your test. (ex: coffee, chocolate, sodas, tea).  Do wear comfortable clothes (no dresses or overalls) and walking shoes, tennis shoes preferred (No heels or open toe shoes are allowed).  Do NOT wear cologne, perfume, aftershave, or lotions (deodorant is allowed).  If these instructions are not followed, your test will have to be rescheduled.

## 2018-03-13 NOTE — Progress Notes (Signed)
Cardiology Office Note    Date:  03/15/2018   ID:  Sonya Small, Sonya Small 1950/12/03, MRN [id number]  PCP:  [name], MD  Cardiologist:  Dr. [name]  Chief Complaint  Patient presents with  . Follow-up    seen for Dr. [name].     History of Present Illness:  Sonya Small is a 67 y.o. female with PMH of HTN, HLD, OSA, PAD, CVA, chronic diastolic heart failure and CAD.  Patient had a NSTEMI in March 2010 and underwent DES to left circumflex.  She had another NSTEMI in May 2011 secondary to thrombotic RCA lesion, cardiac catheterization showed nonobstructive disease, medical therapy was recommended.  She was admitted for recurrent chest pain in January 2014, Myoview was negative.  She was admitted for acute CVA in October 2015 after presenting with slurred speech and facial drooping.  MRI showed acute right MCA infarct involving basal ganglia and periventricular white matter.  She was started on aspirin along with Plavix.  Last Myoview obtained in August 2017 showed no ischemia, normal EF.  She was last seen by Dr. [name] in June 2018, she was very sedentary at the time and notes dyspnea on exertion with any activity.  ABI obtained in July 2018 was normal.  Patient presented to the ED yesterday for evaluation of chest pain.  This has been ongoing for the past 2 weeks.  Patient presents today for cardiology office visit.  She has no lower extremity edema, orthopnea or PND.  She does not do much strenuous activity other than walking her dog.  Her recent chest pain does not have clear correlation with the degree of exertion.  For the past 2 weeks, she has been noticing intermittent chest discomfort.  I recommended treadmill Myoview, if she cannot tolerate the treadmill portion, we will switch it to [employer].   Past Medical History:  Diagnosis Date  . Anxiety   . CAD (coronary artery disease)    a. NSTEMI 11/2008 s/p DES to LCx (3.0x12 Xience); b. NSTEMI 01/2010 secondary to thrombotic RCA lesion  (non-obstructive)-->med rx (integrilin x 24 hrs + plavix); c. 09/2012 negative Myoview.  . Chronic diastolic CHF (congestive heart failure) ([place])    a. 06/2014 Echo: EF 55-60%, no rwma, Gr1 DD, mild AI.  [name] Depression   . Dizziness   . GERD (gastroesophageal reflux disease)   . Headache   . Hyperlipidemia   . Hypertensive heart disease   . Lumbar disc disease   . Myocardial infarction ([place])   . Obstructive sleep apnea   . OP (osteoporosis)   . Overweight(278.02)   . PAD (peripheral artery disease) ([place])    a. Emboli to R foot 2010 from partially occlusive lesion in R EIA, s/p stenting. - followed by Dr. Donnetta Hutching;  b. 10/2015 ABIs: R 1.03, L 0.97.  [name] Restless leg   . Stroke ([place])   . TIA (transient ischemic attack)   . Tobacco abuse     Past Surgical History:  Procedure Laterality Date  . EYE SURGERY     at age 24  . hysterectomy -age 68    . ILIAC ARTERY STENT     RIGHT ILIAC STENT  . KNEE ARTHROSCOPY    . LUMBAR LAMINECTOMY    . TUBAL LIGATION      Current Medications: Outpatient Medications Prior to Visit  Medication Sig Dispense Refill  . metoprolol tartrate (LOPRESSOR) 50 MG tablet Take 25 mg by mouth 2 (two) times daily.    [name]  Aclidinium Bromide (TUDORZA PRESSAIR) 400 MCG/ACT AEPB Inhale 400 mcg into the lungs.    [name] albuterol (PROVENTIL HFA;VENTOLIN HFA) 108 (90 BASE) MCG/ACT inhaler Inhale 2 puffs into the lungs every 4 (four) hours as needed for wheezing or shortness of breath.    . ALPRAZolam (XANAX) 0.5 MG tablet Take 0.5 mg by mouth 2 (two) times daily as needed.     [name] aspirin (ASPIRIN EC) 81 MG EC tablet Take 81 mg by mouth daily. Swallow whole.    [name] buPROPion (WELLBUTRIN XL) 300 MG 24 hr tablet Take 300 mg by mouth daily.     . clopidogrel (PLAVIX) 75 MG tablet Take 1 tablet (75 mg total) by mouth daily. 90 tablet 0  . diclofenac sodium (VOLTAREN) 1 % GEL Apply 2 g topically daily as needed (mild pain).    [name] esomeprazole (NEXIUM) 40 MG capsule Take 40 mg by mouth  daily at 12 noon.    . fluticasone (FLONASE) 50 MCG/ACT nasal spray Place 1 spray into both nostrils daily.    . Fluticasone-Salmeterol (ADVAIR) 250-50 MCG/DOSE AEPB Inhale 1 puff into the lungs 2 (two) times daily.    . furosemide (LASIX) 20 MG tablet Take 20 mg by mouth 2 (two) times daily.    [name] lisinopril (PRINIVIL,ZESTRIL) 40 MG tablet Take 1 tablet (40 mg total) by mouth daily. 90 tablet 3  . nitroGLYCERIN (NITROSTAT) 0.4 MG SL tablet Place 1 tablet (0.4 mg total) under the tongue every 5 (five) minutes as needed. 25 tablet 2  . oxyCODONE-acetaminophen (PERCOCET) 10-325 MG per tablet Take 1 tablet by mouth every 8 (eight) hours as needed for pain.    . polyethylene glycol (MIRALAX / GLYCOLAX) packet Take 17 g by mouth daily.    . rosuvastatin (CRESTOR) 20 MG tablet Take 1 tablet (20 mg total) by mouth daily. 30 tablet 1  . tiZANidine (ZANAFLEX) 4 MG tablet Take 4 mg by mouth 2 (two) times daily.    . valACYclovir (VALTREX) 500 MG tablet Take 500 mg by mouth daily as needed (fever blisters).    . VASCEPA 1 g CAPS Take 1 capsule by mouth daily.    . VESICARE 10 MG tablet Take 10 tablets by mouth daily.    . metoprolol (LOPRESSOR) 50 MG tablet Take 1 tablet by mouth daily.     No facility-administered medications prior to visit.      Allergies:   Patient has no known allergies.   Social History   Socioeconomic History  . Marital status: Legally Separated    Spouse name: Not on file  . Number of children: 3  . Years of education: 10 th  . Highest education level: Not on file  Occupational History  . Occupation: DISABLED    Employer: UNEMPLOYED  Social Needs  . Financial resource strain: Not on file  . Food insecurity:    Worry: Not on file    Inability: Not on file  . Transportation needs:    Medical: Not on file    Non-medical: Not on file  Tobacco Use  . Smoking status: Former Smoker    Last attempt to quit: 11/20/2012    Years since quitting: 5.3  . Smokeless tobacco:  Never Used  Substance and Sexual Activity  . Alcohol use: No    Alcohol/week: 0.0 oz  . Drug use: No  . Sexual activity: Never  Lifestyle  . Physical activity:    Days per week: Not on file    Minutes per session:  Not on file  . Stress: Not on file  Relationships  . Social connections:    Talks on phone: Not on file    Gets together: Not on file    Attends religious service: Not on file    Active member of club or organization: Not on file    Attends meetings of clubs or organizations: Not on file    Relationship status: Not on file  Other Topics Concern  . Not on file  Social History Narrative   Patient is single with 3 children, 1 deceased.   Patient is right handed.   Patient has 10 th grade education.   Patient drinks 5 or more cups daily.     Family History:  The patient's family history includes Alzheimer's disease in her mother; Cancer in her brother; Depression in her brother; Diabetes in her brother; Heart disease in her unknown relative; Hepatitis C in her brother; Hypertension in her brother; Thyroid disease in her brother.   ROS:   Please see the history of present illness.    ROS All other systems reviewed and are negative.   PHYSICAL EXAM:   VS:  BP (!) 173/73   Pulse 64   Ht 5\' 5"  (1.651 m)   Wt 185 lb 6.4 oz (84.1 kg)   BMI 30.85 kg/m    GEN: Well nourished, well developed, in no acute distress  HEENT: normal  Neck: no JVD, carotid bruits, or masses Cardiac: RRR; no murmurs, rubs, or gallops,no edema  Respiratory:  clear to auscultation bilaterally, normal work of breathing GI: soft, nontender, nondistended, + BS MS: no deformity or atrophy  Skin: warm and dry, no rash Neuro:  Alert and Oriented x 3, Strength and sensation are intact Psych: euthymic mood, full affect  Wt Readings from Last 3 Encounters:  03/13/18 185 lb 6.4 oz (84.1 kg)  03/12/18 186 lb (84.4 kg)  03/07/17 185 lb 6.4 oz (84.1 kg)      Studies/Labs Reviewed:   EKG:  EKG is  not ordered today.    Recent Labs: 03/12/2018: BUN 12; Creatinine, Ser 0.64; Hemoglobin 14.6; Platelets 263; Potassium 3.3; Sodium 145   Lipid Panel    Component Value Date/Time   CHOL 179 07/13/2014 0108   TRIG 212 (H) 07/13/2014 0108   HDL 36 (L) 07/13/2014 0108   CHOLHDL 5.0 07/13/2014 0108   VLDL 42 (H) 07/13/2014 0108   LDLCALC 101 (H) 07/13/2014 0108    Additional studies/ records that were reviewed today include:   Myoview 05/25/2016 Study Highlights     The left ventricular ejection fraction is normal (55-65%).  Nuclear stress EF: 58%.  There was no ST segment deviation noted during stress.  Findings consistent with prior myocardial infarction.  This is a low risk study.   Small apical infarct no ischemia Apical hypokinesis overall normal EF 58%  Evidence for LVH      ASSESSMENT:    1. Chest pain, unspecified type   2. Essential hypertension   3. Hyperlipidemia, unspecified hyperlipidemia type   4. Chronic diastolic heart failure ([place])   5. Coronary artery disease involving native coronary artery of native heart without angina pectoris      PLAN:  In order of problems listed above:  1. Chest pain: Her symptom does not have clear correlation with exertion.  I recommend a treadmill Myoview, if she cannot keep up with a treadmill, then we will switch to [place]  2. CAD: On aspirin and Crestor. Recent recurrent chest  discomfort does not have clear correlation with exertion, proceed with Myoview.  3. Hypertension: Blood pressure elevated today.  She is somewhat anxious today, however she says her normal blood pressure at home has been good.  She is on 50 mg daily of metoprolol tartrate, I will switch this to 25 mg twice daily.  4. Hyperlipidemia: On Crestor, will defer annual lipid panel to primary care provider.  5. Chronic diastolic heart failure: Appears to be euvolemic on examination.    Medication Adjustments/Labs and Tests Ordered: Current  medicines are reviewed at length with the patient today.  Concerns regarding medicines are outlined above.  Medication changes, Labs and Tests ordered today are listed in the Patient Instructions below. Patient Instructions  Isaac Laud PA has ordered a Exercise Myocardial Perfusion Imaging Study. Instructions noted below.   Isaac Laud PA has recommended to take metoprolol tartrate 25mg  twice daily (half of the 50mg  tablet twice daily)   It is recommended that you schedule a follow-up appointment in [place] with Isaac Laud PA on a day Dr. [name] is in the office    The test will take approximately 3 to 4 hours to complete; you may bring reading material.  If someone comes with you to your appointment, they will need to remain in the main lobby due to limited space in the testing area.   You will need to hold the following medications prior to your stress test: beta-blockers (24 hours prior to test)   How to prepare for your Myocardial Perfusion Test:  Do not eat or drink 3 hours prior to your test, except you may have water.  Do not consume products containing caffeine (regular or decaffeinated) 12 hours prior to your test. (ex: coffee, chocolate, sodas, tea).  Do wear comfortable clothes (no dresses or overalls) and walking shoes, tennis shoes preferred (No heels or open toe shoes are allowed).  Do NOT wear cologne, perfume, aftershave, or lotions (deodorant is allowed).  If these instructions are not followed, your test will have to be rescheduled.     Hilbert Corrigan, [place]  03/15/2018 11:47 PM    [address], [place],   [address] Phone: [phone]; Fax: [phone]

## 2018-03-15 ENCOUNTER — Encounter: Payer: Self-pay | Admitting: Physician Assistant

## 2018-03-20 ENCOUNTER — Telehealth (HOSPITAL_COMMUNITY): Payer: Self-pay

## 2018-03-20 NOTE — Telephone Encounter (Signed)
Encounter complete. 

## 2018-03-25 ENCOUNTER — Ambulatory Visit (HOSPITAL_COMMUNITY)
Admission: RE | Admit: 2018-03-25 | Discharge: 2018-03-25 | Disposition: A | Discharge status: Home / Self Care | Payer: Medicare Other | Source: Ambulatory Visit | Attending: Cardiovascular Disease | Admitting: Cardiovascular Disease

## 2018-03-25 DIAGNOSIS — R110 Nausea: Secondary | ICD-10-CM | POA: Diagnosis not present

## 2018-03-25 DIAGNOSIS — J449 Chronic obstructive pulmonary disease, unspecified: Secondary | ICD-10-CM | POA: Diagnosis not present

## 2018-03-25 DIAGNOSIS — G4733 Obstructive sleep apnea (adult) (pediatric): Secondary | ICD-10-CM | POA: Diagnosis not present

## 2018-03-25 DIAGNOSIS — R002 Palpitations: Secondary | ICD-10-CM | POA: Diagnosis not present

## 2018-03-25 DIAGNOSIS — R42 Dizziness and giddiness: Secondary | ICD-10-CM | POA: Insufficient documentation

## 2018-03-25 DIAGNOSIS — I252 Old myocardial infarction: Secondary | ICD-10-CM | POA: Diagnosis not present

## 2018-03-25 DIAGNOSIS — Z6830 Body mass index (BMI) 30.0-30.9, adult: Secondary | ICD-10-CM | POA: Diagnosis not present

## 2018-03-25 DIAGNOSIS — R9439 Abnormal result of other cardiovascular function study: Secondary | ICD-10-CM | POA: Insufficient documentation

## 2018-03-25 DIAGNOSIS — I509 Heart failure, unspecified: Secondary | ICD-10-CM | POA: Insufficient documentation

## 2018-03-25 DIAGNOSIS — Z8249 Family history of ischemic heart disease and other diseases of the circulatory system: Secondary | ICD-10-CM | POA: Insufficient documentation

## 2018-03-25 DIAGNOSIS — R0609 Other forms of dyspnea: Secondary | ICD-10-CM | POA: Insufficient documentation

## 2018-03-25 DIAGNOSIS — I2510 Atherosclerotic heart disease of native coronary artery without angina pectoris: Secondary | ICD-10-CM | POA: Insufficient documentation

## 2018-03-25 DIAGNOSIS — E669 Obesity, unspecified: Secondary | ICD-10-CM | POA: Insufficient documentation

## 2018-03-25 DIAGNOSIS — G2581 Restless legs syndrome: Secondary | ICD-10-CM | POA: Diagnosis not present

## 2018-03-25 DIAGNOSIS — R079 Chest pain, unspecified: Secondary | ICD-10-CM | POA: Diagnosis not present

## 2018-03-25 DIAGNOSIS — I110 Hypertensive heart disease with heart failure: Secondary | ICD-10-CM | POA: Diagnosis not present

## 2018-03-25 DIAGNOSIS — Z8673 Personal history of transient ischemic attack (TIA), and cerebral infarction without residual deficits: Secondary | ICD-10-CM | POA: Diagnosis not present

## 2018-03-25 LAB — MYOCARDIAL PERFUSION IMAGING
CHL CUP NUCLEAR SDS: 0
CHL CUP RESTING HR STRESS: 70 {beats}/min
LVDIAVOL: 96 mL (ref 46–106)
LVSYSVOL: 46 mL
Peak HR: 122 {beats}/min
SRS: 4
SSS: 4
TID: 0.91

## 2018-03-25 MED ORDER — REGADENOSON 0.4 MG/5ML IV SOLN
0.4000 mg | Freq: Once | INTRAVENOUS | Status: AC
  Administered 2018-03-25: 0.4 mg via INTRAVENOUS

## 2018-03-25 MED ORDER — AMINOPHYLLINE 25 MG/ML IV SOLN
75.0000 mg | Freq: Once | INTRAVENOUS | Status: AC
  Administered 2018-03-25: 75 mg via INTRAVENOUS

## 2018-03-25 MED ORDER — TECHNETIUM TC 99M TETROFOSMIN IV KIT
27.0000 | PACK | Freq: Once | INTRAVENOUS | Status: AC | PRN
  Administered 2018-03-25: 27 via INTRAVENOUS
  Filled 2018-03-25: qty 27

## 2018-03-25 MED ORDER — TECHNETIUM TC 99M TETROFOSMIN IV KIT
7.8000 | PACK | Freq: Once | INTRAVENOUS | Status: AC | PRN
  Administered 2018-03-25: 7.8 via INTRAVENOUS
  Filled 2018-03-25: qty 8

## 2018-05-22 ENCOUNTER — Encounter: Payer: Self-pay | Admitting: Physician Assistant

## 2018-05-22 ENCOUNTER — Ambulatory Visit (INDEPENDENT_AMBULATORY_CARE_PROVIDER_SITE_OTHER): Payer: Medicare Other | Admitting: Physician Assistant

## 2018-05-22 VITALS — BP 158/82 | HR 68 | Ht 65.0 in | Wt 185.8 lb

## 2018-05-22 DIAGNOSIS — I10 Essential (primary) hypertension: Secondary | ICD-10-CM

## 2018-05-22 DIAGNOSIS — I2510 Atherosclerotic heart disease of native coronary artery without angina pectoris: Secondary | ICD-10-CM | POA: Diagnosis not present

## 2018-05-22 DIAGNOSIS — G4733 Obstructive sleep apnea (adult) (pediatric): Secondary | ICD-10-CM

## 2018-05-22 DIAGNOSIS — I5032 Chronic diastolic (congestive) heart failure: Secondary | ICD-10-CM

## 2018-05-22 DIAGNOSIS — R42 Dizziness and giddiness: Secondary | ICD-10-CM

## 2018-05-22 DIAGNOSIS — E785 Hyperlipidemia, unspecified: Secondary | ICD-10-CM | POA: Diagnosis not present

## 2018-05-22 NOTE — Progress Notes (Signed)
Cardiology Office Note    Date:  05/24/2018   ID:  Sonya Small, Sonya Small 02-18-1951, MRN [id number]  PCP:  [name], MD  Cardiologist:  Dr. [name]   Chief Complaint  Patient presents with  . office visit    still having dizziness, denies current chest pains, does mention tightness, mentions swelling in feet, SOB    History of Present Illness:  Sonya Small is a 67 y.o. female with PMH of HTN, HLD, OSA, PAD, CVA, chronic diastolic heart failure and CAD.  Patient had a NSTEMI in March 2010 and underwent DES to left circumflex.  She had another NSTEMI in May 2011 secondary to thrombotic RCA lesion, cardiac catheterization showed nonobstructive disease, medical therapy was recommended.  She was admitted for recurrent chest pain in January 2014, Myoview was negative.  She was admitted for acute CVA in October 2015 after presenting with slurred speech and facial drooping.  MRI showed acute right MCA infarct involving basal ganglia and periventricular white matter.  She was started on aspirin along with Plavix.  Myoview obtained in August 2017 showed no ischemia, normal EF.  She was last seen by Dr. [name] in June 2018, she was very sedentary at the time and notes dyspnea on exertion with any activity.  ABI obtained in July 2018 was normal.    I last saw the patient on 03/13/2018, she presented to the ED today prior with chest pain.  I repeated a Myoview on 03/25/2018 which showed EF 52%, small sized moderate intensity fixed apical perfusion defect likely attenuation artifact was seen, no reversible ischemia otherwise.  Overall this is a low risk stress test.  Patient presents today for cardiology office visit.  For the past 3 weeks, he she has been having persistent dizziness.  She feels like her room is spinning.  This symptom is constant.  Otherwise she denies any chest pain or shortness of breath.  On physical exam, her heart rate is quite regular.  Her blood pressure remain elevated.  On further  discussion, it sounds like she is on a lot of medication that is not on her medication list.  She reportedly has been taking for blood pressure medications at home.  My concern is that her dizziness may be the result of polypharmacy and drug interaction, I will hold off on prescribing meclizine at this time and will bring the patient back in about 3 weeks for reassessment.  I asked her to bring all of her medications with her during the next visit.  During the meantime, I will obtain a CBC and basic metabolic panel to rule out secondary causes for dizziness.   Past Medical History:  Diagnosis Date  . Anxiety   . CAD (coronary artery disease)    a. NSTEMI 11/2008 s/p DES to LCx (3.0x12 Xience); b. NSTEMI 01/2010 secondary to thrombotic RCA lesion (non-obstructive)-->med rx (integrilin x 24 hrs + plavix); c. 09/2012 negative Myoview.  . Chronic diastolic CHF (congestive heart failure) ([place])    a. 06/2014 Echo: EF 55-60%, no rwma, Gr1 DD, mild AI.  [name] Depression   . Dizziness   . GERD (gastroesophageal reflux disease)   . Headache   . Hyperlipidemia   . Hypertensive heart disease   . Lumbar disc disease   . Myocardial infarction ([place])   . Obstructive sleep apnea   . OP (osteoporosis)   . Overweight(278.02)   . PAD (peripheral artery disease) ([place])    a. Emboli to R foot 2010 from  partially occlusive lesion in R EIA, s/p stenting. - followed by Dr. Donnetta Hutching;  b. 10/2015 ABIs: R 1.03, L 0.97.  [name] Restless leg   . Stroke ([place])   . TIA (transient ischemic attack)   . Tobacco abuse     Past Surgical History:  Procedure Laterality Date  . EYE SURGERY     at age 82  . hysterectomy -age 4    . ILIAC ARTERY STENT     RIGHT ILIAC STENT  . KNEE ARTHROSCOPY    . LUMBAR LAMINECTOMY    . TUBAL LIGATION      Current Medications: Outpatient Medications Prior to Visit  Medication Sig Dispense Refill  . Aclidinium Bromide (TUDORZA PRESSAIR) 400 MCG/ACT AEPB Inhale 400 mcg into the lungs.    [name]  albuterol (PROVENTIL HFA;VENTOLIN HFA) 108 (90 BASE) MCG/ACT inhaler Inhale 2 puffs into the lungs every 4 (four) hours as needed for wheezing or shortness of breath.    . ALPRAZolam (XANAX) 0.5 MG tablet Take 0.5 mg by mouth 2 (two) times daily as needed.     [name] aspirin (ASPIRIN EC) 81 MG EC tablet Take 81 mg by mouth daily. Swallow whole.    [name] buPROPion (WELLBUTRIN XL) 300 MG 24 hr tablet Take 300 mg by mouth daily.     . clopidogrel (PLAVIX) 75 MG tablet Take 1 tablet (75 mg total) by mouth daily. 90 tablet 0  . diclofenac sodium (VOLTAREN) 1 % GEL Apply 2 g topically daily as needed (mild pain).    [name] esomeprazole (NEXIUM) 40 MG capsule Take 40 mg by mouth daily at 12 noon.    . fluticasone (FLONASE) 50 MCG/ACT nasal spray Place 1 spray into both nostrils daily.    . Fluticasone-Salmeterol (ADVAIR) 250-50 MCG/DOSE AEPB Inhale 1 puff into the lungs 2 (two) times daily.    . furosemide (LASIX) 20 MG tablet Take 20 mg by mouth 2 (two) times daily.    . metoprolol tartrate (LOPRESSOR) 50 MG tablet Take 25 mg by mouth 2 (two) times daily.    . nitroGLYCERIN (NITROSTAT) 0.4 MG SL tablet Place 1 tablet (0.4 mg total) under the tongue every 5 (five) minutes as needed. 25 tablet 2  . oxyCODONE-acetaminophen (PERCOCET) 10-325 MG per tablet Take 1 tablet by mouth every 8 (eight) hours as needed for pain.    . rosuvastatin (CRESTOR) 20 MG tablet Take 1 tablet (20 mg total) by mouth daily. 30 tablet 1  . tiZANidine (ZANAFLEX) 4 MG tablet Take 4 mg by mouth 2 (two) times daily.    . valACYclovir (VALTREX) 500 MG tablet Take 500 mg by mouth daily as needed (fever blisters).    . VASCEPA 1 g CAPS Take 2 capsules by mouth daily.     . VESICARE 10 MG tablet Take 10 tablets by mouth daily.    [name] lisinopril (PRINIVIL,ZESTRIL) 40 MG tablet Take 1 tablet (40 mg total) by mouth daily. 90 tablet 3  . polyethylene glycol (MIRALAX / GLYCOLAX) packet Take 17 g by mouth daily.     No facility-administered medications  prior to visit.      Allergies:   Patient has no known allergies.   Social History   Socioeconomic History  . Marital status: Legally Separated    Spouse name: Not on file  . Number of children: 3  . Years of education: 10 th  . Highest education level: Not on file  Occupational History  . Occupation: Tour manager: UNEMPLOYED  Social Needs  . Financial resource strain: Not on file  . Food insecurity:    Worry: Not on file    Inability: Not on file  . Transportation needs:    Medical: Not on file    Non-medical: Not on file  Tobacco Use  . Smoking status: Former Smoker    Last attempt to quit: 11/20/2012    Years since quitting: 5.5  . Smokeless tobacco: Never Used  Substance and Sexual Activity  . Alcohol use: No    Alcohol/week: 0.0 standard drinks  . Drug use: No  . Sexual activity: Never  Lifestyle  . Physical activity:    Days per week: Not on file    Minutes per session: Not on file  . Stress: Not on file  Relationships  . Social connections:    Talks on phone: Not on file    Gets together: Not on file    Attends religious service: Not on file    Active member of club or organization: Not on file    Attends meetings of clubs or organizations: Not on file    Relationship status: Not on file  Other Topics Concern  . Not on file  Social History Narrative   Patient is single with 3 children, 1 deceased.   Patient is right handed.   Patient has 10 th grade education.   Patient drinks 5 or more cups daily.     Family History:  The patient's family history includes Alzheimer's disease in her mother; Cancer in her brother; Depression in her brother; Diabetes in her brother; Heart disease in her unknown relative; Hepatitis C in her brother; Hypertension in her brother; Thyroid disease in her brother.   ROS:   Please see the history of present illness.    ROS All other systems reviewed and are negative.   PHYSICAL EXAM:   VS:  BP (!) 158/82 (BP  Location: Right Arm, Patient Position: Sitting)   Pulse 68   Ht 5\' 5"  (1.651 m)   Wt 185 lb 12.8 oz (84.3 kg)   SpO2 95%   BMI 30.92 kg/m    GEN: Well nourished, well developed, in no acute distress  HEENT: normal  Neck: no JVD, carotid bruits, or masses Cardiac: RRR; no murmurs, rubs, or gallops,no edema  Respiratory:  clear to auscultation bilaterally, normal work of breathing GI: soft, nontender, nondistended, + BS MS: no deformity or atrophy  Skin: warm and dry, no rash Neuro:  Alert and Oriented x 3, Strength and sensation are intact Psych: euthymic mood, full affect  Wt Readings from Last 3 Encounters:  05/22/18 185 lb 12.8 oz (84.3 kg)  03/25/18 185 lb (83.9 kg)  03/13/18 185 lb 6.4 oz (84.1 kg)      Studies/Labs Reviewed:   EKG:  EKG is not ordered today.    Recent Labs: 05/22/2018: BUN 11; Creatinine, Ser 0.57; Hemoglobin 14.7; Platelets 262; Potassium 4.0; Sodium 150   Lipid Panel    Component Value Date/Time   CHOL 179 07/13/2014 0108   TRIG 212 (H) 07/13/2014 0108   HDL 36 (L) 07/13/2014 0108   CHOLHDL 5.0 07/13/2014 0108   VLDL 42 (H) 07/13/2014 0108   LDLCALC 101 (H) 07/13/2014 0108    Additional studies/ records that were reviewed today include:   Myoview 03/25/2018 Study Highlights     The left ventricular ejection fraction is mildly decreased (45-54%).  Nuclear stress EF: 52%.  No T wave inversion was noted during stress.  There  was no ST segment deviation noted during stress.  Defect 1: There is a small defect of moderate severity.  This is a low risk study.   Small size, moderate intensity fixed apical perfusion defect, likely attenuation artifact. No reversible ischemia. LVEF 52% with normal wall motion. This is a low risk study.      ASSESSMENT:    1. Dizziness   2. Chronic diastolic heart failure ([place])   3. Essential hypertension   4. Hyperlipidemia, unspecified hyperlipidemia type   5. OSA (obstructive sleep apnea)   6.  Coronary artery disease involving native coronary artery of native heart without angina pectoris      PLAN:  In order of problems listed above:  1. Dizziness: Obtain CBC and basic metabolic panel.  She describes the symptom as the room is spinning, this may represent Vertigo.  However I am also concerned about polypharmacy, apparently she is taking a lot of medication that may not be on the current medication list.  I will bring her back in about 3 weeks for medication adjustment.  She has been instructed to bring in all of her medications with her on the next follow-up.  2. CAD: Recent Myoview was low risk.  Continue aspirin Plavix  3. Hypertension: Blood pressure improved compared to the last visit, however remain very high.  Hesitant to adjust medication prior to reviewing her home medication.  I am concerned about polypharmacy.  4. Hyperlipidemia: On Crestor 20 mg daily.  Will defer fasting lipid panel to primary care provider.  LDL goal less than 70    Medication Adjustments/Labs and Tests Ordered: Current medicines are reviewed at length with the patient today.  Concerns regarding medicines are outlined above.  Medication changes, Labs and Tests ordered today are listed in the Patient Instructions below. Patient Instructions  Medication Instructions: Almyra Deforest, PA recommends that you continue on your current medications as directed. Please refer to the Current Medication list given to you today.  Labwork: Your physician recommends that you return for lab work TODAY.  Testing/Procedures: NONE ORDERED  Follow-up: Your physician recommends that you schedule a follow-up appointment in 3 weeks with [facility]. BRING ALL MEDICATIONS TO THIS APPOINTMENT!!!  Isaac Laud recommends that you schedule a follow-up appointment in 2-3 months with Dr [name].  If you need a refill on your cardiac medications before your next appointment, please call your pharmacy.    Hilbert Corrigan, [place]  05/24/2018  10:57 PM    [facility] [address], [place], [place]  [address] Phone: [phone]; Fax: [phone]

## 2018-05-22 NOTE — Patient Instructions (Signed)
Medication Instructions: Almyra Deforest, PA recommends that you continue on your current medications as directed. Please refer to the Current Medication list given to you today.  Labwork: Your physician recommends that you return for lab work TODAY.  Testing/Procedures: NONE ORDERED  Follow-up: Your physician recommends that you schedule a follow-up appointment in 3 weeks with [facility]. BRING ALL MEDICATIONS TO THIS APPOINTMENT!!!  Isaac Laud recommends that you schedule a follow-up appointment in 2-3 months with Dr [name].  If you need a refill on your cardiac medications before your next appointment, please call your pharmacy.

## 2018-05-23 LAB — CBC
HEMOGLOBIN: 14.7 g/dL (ref 11.1–15.9)
Hematocrit: 45.1 % (ref 34.0–46.6)
MCH: 27.9 pg (ref 26.6–33.0)
MCHC: 32.6 g/dL (ref 31.5–35.7)
MCV: 86 fL (ref 79–97)
Platelets: 262 10*3/uL (ref 150–450)
RBC: 5.26 x10E6/uL (ref 3.77–5.28)
RDW: 12.6 % (ref 12.3–15.4)
WBC: 9.3 10*3/uL (ref 3.4–10.8)

## 2018-05-23 LAB — BASIC METABOLIC PANEL
BUN / CREAT RATIO: 19 (ref 12–28)
BUN: 11 mg/dL (ref 8–27)
CO2: 26 mmol/L (ref 20–29)
CREATININE: 0.57 mg/dL (ref 0.57–1.00)
Calcium: 9.1 mg/dL (ref 8.7–10.3)
Chloride: 104 mmol/L (ref 96–106)
GFR calc Af Amer: 109 mL/min/{1.73_m2} (ref >59)
GFR, EST NON AFRICAN AMERICAN: 95 mL/min/{1.73_m2} (ref >59)
Glucose: 156 mg/dL — ABNORMAL HIGH (ref 65–99)
Potassium: 4 mmol/L (ref 3.5–5.2)
SODIUM: 150 mmol/L — AB (ref 134–144)

## 2018-05-24 ENCOUNTER — Encounter: Payer: Self-pay | Admitting: Physician Assistant

## 2018-06-10 ENCOUNTER — Telehealth: Payer: Self-pay | Admitting: Physician Assistant

## 2018-06-10 NOTE — Telephone Encounter (Signed)
New Message   Pt states she is returning call for nurse about labs and also that it is best to reach her on her mobile. Please call

## 2018-06-10 NOTE — Telephone Encounter (Signed)
Left message on patients cell advising her to contact the office to discuss her recent lab work.

## 2018-06-10 NOTE — Telephone Encounter (Signed)
Pt updated with lab results. Verbalized understanding. 

## 2018-06-17 ENCOUNTER — Encounter: Payer: Self-pay | Admitting: Cardiology

## 2018-06-18 ENCOUNTER — Ambulatory Visit (INDEPENDENT_AMBULATORY_CARE_PROVIDER_SITE_OTHER): Payer: Medicare Other | Admitting: Physician Assistant

## 2018-06-18 ENCOUNTER — Telehealth: Payer: Self-pay | Admitting: Physician Assistant

## 2018-06-18 ENCOUNTER — Encounter: Payer: Self-pay | Admitting: Physician Assistant

## 2018-06-18 VITALS — BP 104/62 | HR 72 | Ht 65.0 in | Wt 182.0 lb

## 2018-06-18 DIAGNOSIS — I739 Peripheral vascular disease, unspecified: Secondary | ICD-10-CM

## 2018-06-18 DIAGNOSIS — I10 Essential (primary) hypertension: Secondary | ICD-10-CM | POA: Diagnosis not present

## 2018-06-18 DIAGNOSIS — R42 Dizziness and giddiness: Secondary | ICD-10-CM | POA: Diagnosis not present

## 2018-06-18 DIAGNOSIS — I2510 Atherosclerotic heart disease of native coronary artery without angina pectoris: Secondary | ICD-10-CM | POA: Diagnosis not present

## 2018-06-18 DIAGNOSIS — E785 Hyperlipidemia, unspecified: Secondary | ICD-10-CM

## 2018-06-18 DIAGNOSIS — I5032 Chronic diastolic (congestive) heart failure: Secondary | ICD-10-CM

## 2018-06-18 DIAGNOSIS — G4733 Obstructive sleep apnea (adult) (pediatric): Secondary | ICD-10-CM

## 2018-06-18 MED ORDER — NITROGLYCERIN 0.4 MG SL SUBL: 0.4000 mg | SUBLINGUAL_TABLET | SUBLINGUAL | 11 refills | Status: DC | PRN

## 2018-06-18 MED ORDER — NEXIUM 40 MG PO CPDR: 40.0000 mg | DELAYED_RELEASE_CAPSULE | Freq: Every day | ORAL | 6 refills | Status: DC

## 2018-06-18 MED ORDER — ESOMEPRAZOLE MAGNESIUM 40 MG PO CPDR: 40.0000 mg | DELAYED_RELEASE_CAPSULE | Freq: Every day | ORAL | 3 refills | Status: DC

## 2018-06-18 MED ORDER — NITROGLYCERIN 0.4 MG SL SUBL: 0.4000 mg | SUBLINGUAL_TABLET | SUBLINGUAL | 2 refills | Status: DC | PRN

## 2018-06-18 NOTE — Progress Notes (Signed)
Cardiology Office Note    Date:  06/18/2018   ID:  Sonya Small, Sonya Small [birth year], MRN [id number]  PCP:  [name], MD  Cardiologist:  Dr. [name]  Chief Complaint  Patient presents with  . Follow-up    seen for Dr. [name].     History of Present Illness:  Sonya Small is a 67 y.o. female with PMH of HTN, HLD, OSA, PAD, CVA, chronic diastolic heart failureand CAD. Patient had a NSTEMI in March 2010 and underwent DES to left circumflex.She had anotherNSTEMI in May 2011 secondary to thrombotic RCA lesion, cardiac catheterization showed nonobstructive disease, medical therapy was recommended. She was admitted for recurrent chest pain in January 2014, Myoview was negative. She was admitted for acute CVA in October 2015 after presenting with slurred speech and facial drooping. MRI showed acute right MCA infarct involving basal ganglia and periventricular white matter. She was started on aspirin along with Plavix. Myoview obtained in August 2017 showed no ischemia, normal EF. She was last seen by Dr. [name] in June 2018, she was very sedentary at the time and notes dyspnea on exertion with any activity. ABI obtained in July 2018 was normal.   I last saw the patient on 03/13/2018, she presented to the ED today prior with chest pain.  I repeated a Myoview on 03/25/2018 which showed EF 52%, small sized moderate intensity fixed apical perfusion defect likely attenuation artifact was seen, no reversible ischemia otherwise.  Overall this is a low risk stress test.  I saw the patient on 05/22/2018, he was having a lot of dizziness for the past 3 weeks.  She also complaining of her room spinning.  The symptom seems to be constant at the time.  On further discussion, her friend mentions she was on a lot of medication that may not be on her medication list.  I wanted to hold off on prescribing meclizine at the time and bring the patient back to go over all of her medication bottles.  Patient returns  today for another week visit.  She says the room does not spin so much but she feels like she is living at fault.  I went through all her medication bottles.  Instead of 40 mg lisinopril she is taking 10 mg.  Despite her medication bottles mentioning 75 mg twice daily of metoprolol, she is taking 25 mg twice daily.  Her blood pressure is normal today.  She does complain of lower extremity edema as the day goes on, this is consistent with venous insufficiency.  I will keep her on the current dose of diuretic as it has been working very well for her.  Recent lab work shows stable renal function and the electrolyte.  I am concerned about possibility of polypharmacy.  She is on the Zoloft, Lyrica, oxycodone, Xanax and tizanidine.  She is taking oxycodone and Xanax almost as a scheduled medication instead of as needed.  I will defer to primary care provider to manage the Zoloft and the Lyrica.  I did advise the patient to use oxycodone, Xanax and the tizanidine only on a as needed basis and to minimize the number uses through the day.  I will obtain a ultrasound to make sure there is no significant carotid artery disease.  But my suspicion is her symptom is related to polypharmacy instead of vascular issue.  Otherwise she denies any significant chest discomfort or shortness of breath.  She is euvolemic on physical exam today.   Past Medical  History:  Diagnosis Date  . Anxiety   . CAD (coronary artery disease)    a. NSTEMI 11/2008 s/p DES to LCx (3.0x12 Xience); b. NSTEMI 01/2010 secondary to thrombotic RCA lesion (non-obstructive)-->med rx (integrilin x 24 hrs + plavix); c. 09/2012 negative Myoview.  . Chronic diastolic CHF (congestive heart failure) ([place])    a. 06/2014 Echo: EF 55-60%, no rwma, Gr1 DD, mild AI.  [name] Depression   . Dizziness   . GERD (gastroesophageal reflux disease)   . Headache   . Hyperlipidemia   . Hypertensive heart disease   . Lumbar disc disease   . Myocardial infarction ([place])   .  Obstructive sleep apnea   . OP (osteoporosis)   . Overweight(278.02)   . PAD (peripheral artery disease) ([place])    a. Emboli to R foot 2010 from partially occlusive lesion in R EIA, s/p stenting. - followed by Dr. Donnetta Hutching;  b. 10/2015 ABIs: R 1.03, L 0.97.  [name] Restless leg   . Stroke ([place])   . TIA (transient ischemic attack)   . Tobacco abuse     Past Surgical History:  Procedure Laterality Date  . EYE SURGERY     at age 73  . hysterectomy -age 20    . ILIAC ARTERY STENT     RIGHT ILIAC STENT  . KNEE ARTHROSCOPY    . LUMBAR LAMINECTOMY    . TUBAL LIGATION      Current Medications: Outpatient Medications Prior to Visit  Medication Sig Dispense Refill  . Aclidinium Bromide (TUDORZA PRESSAIR) 400 MCG/ACT AEPB Inhale 400 mcg into the lungs.    [name] albuterol (PROVENTIL HFA;VENTOLIN HFA) 108 (90 BASE) MCG/ACT inhaler Inhale 2 puffs into the lungs every 4 (four) hours as needed for wheezing or shortness of breath.    . ALPRAZolam (XANAX) 0.5 MG tablet Take 0.5 mg by mouth 2 (two) times daily as needed.     [name] aspirin (ASPIRIN EC) 81 MG EC tablet Take 81 mg by mouth daily. Swallow whole.    [name] buPROPion (WELLBUTRIN XL) 300 MG 24 hr tablet Take 300 mg by mouth daily.     . clopidogrel (PLAVIX) 75 MG tablet Take 1 tablet (75 mg total) by mouth daily. 90 tablet 0  . clotrimazole (MYCELEX) 10 MG troche Take 10 mg by mouth 5 (five) times daily.    . diclofenac sodium (VOLTAREN) 1 % GEL Apply 2 g topically daily as needed (mild pain).    . fenofibrate 160 MG tablet Take 160 mg by mouth daily.    . fluticasone (FLONASE) 50 MCG/ACT nasal spray Place 1 spray into both nostrils daily.    . Fluticasone-Salmeterol (ADVAIR) 250-50 MCG/DOSE AEPB Inhale 1 puff into the lungs 2 (two) times daily.    . furosemide (LASIX) 20 MG tablet Take 20 mg by mouth 2 (two) times daily.    [name] lisinopril (PRINIVIL,ZESTRIL) 10 MG tablet Take 10 mg by mouth daily.    . metoprolol tartrate (LOPRESSOR) 50 MG tablet Take 25 mg by  mouth 2 (two) times daily.    . montelukast (SINGULAIR) 10 MG tablet Take 10 mg by mouth at bedtime.    . mupirocin ointment (BACTROBAN) 2 % Place 1 application into the nose 2 (two) times daily.    [name] oxyCODONE-acetaminophen (PERCOCET) 10-325 MG per tablet Take 1 tablet by mouth every 8 (eight) hours as needed for pain.    . polyethylene glycol (MIRALAX / GLYCOLAX) packet Take 17 g by mouth daily.    [name]  potassium chloride SA (K-DUR,KLOR-CON) 20 MEQ tablet Take 20 mEq by mouth daily.    . pregabalin (LYRICA) 50 MG capsule Take 50 mg by mouth 2 (two) times daily.    . rosuvastatin (CRESTOR) 20 MG tablet Take 1 tablet (20 mg total) by mouth daily. 30 tablet 1  . sertraline (ZOLOFT) 100 MG tablet Take 100 mg by mouth daily.    . sitaGLIPtin-metformin (JANUMET) 50-500 MG tablet Take 1 tablet by mouth 2 (two) times daily with a meal.    . tiZANidine (ZANAFLEX) 4 MG tablet Take 4 mg by mouth 2 (two) times daily.    . valACYclovir (VALTREX) 500 MG tablet Take 500 mg by mouth daily as needed (fever blisters).    . VASCEPA 1 g CAPS Take 2 capsules by mouth daily.     . VESICARE 10 MG tablet Take 10 tablets by mouth daily.    [name] esomeprazole (NEXIUM) 40 MG capsule Take 40 mg by mouth daily at 12 noon.    . nitroGLYCERIN (NITROSTAT) 0.4 MG SL tablet Place 1 tablet (0.4 mg total) under the tongue every 5 (five) minutes as needed. 25 tablet 2  . lisinopril (PRINIVIL,ZESTRIL) 40 MG tablet Take 1 tablet (40 mg total) by mouth daily. 90 tablet 3   No facility-administered medications prior to visit.      Allergies:   Patient has no known allergies.   Social History   Socioeconomic History  . Marital status: Legally Separated    Spouse name: Not on file  . Number of children: 3  . Years of education: 10 th  . Highest education level: Not on file  Occupational History  . Occupation: DISABLED    Employer: UNEMPLOYED  Social Needs  . Financial resource strain: Not on file  . Food insecurity:    Worry:  Not on file    Inability: Not on file  . Transportation needs:    Medical: Not on file    Non-medical: Not on file  Tobacco Use  . Smoking status: Former Smoker    Last attempt to quit: 11/20/2012    Years since quitting: 5.5  . Smokeless tobacco: Never Used  Substance and Sexual Activity  . Alcohol use: No    Alcohol/week: 0.0 standard drinks  . Drug use: No  . Sexual activity: Never  Lifestyle  . Physical activity:    Days per week: Not on file    Minutes per session: Not on file  . Stress: Not on file  Relationships  . Social connections:    Talks on phone: Not on file    Gets together: Not on file    Attends religious service: Not on file    Active member of club or organization: Not on file    Attends meetings of clubs or organizations: Not on file    Relationship status: Not on file  Other Topics Concern  . Not on file  Social History Narrative   Patient is single with 3 children, 1 deceased.   Patient is right handed.   Patient has 10 th grade education.   Patient drinks 5 or more cups daily.     Family History:  The patient's family history includes Alzheimer's disease in her mother; Cancer in her brother; Depression in her brother; Diabetes in her brother; Heart disease in her unknown relative; Hepatitis C in her brother; Hypertension in her brother; Thyroid disease in her brother.   ROS:   Please see the history of present illness.  ROS All other systems reviewed and are negative.   PHYSICAL EXAM:   VS:  BP 104/62   Pulse 72   Ht 5\' 5"  (1.651 m)   Wt 182 lb (82.6 kg)   BMI 30.29 kg/m    GEN: Well nourished, well developed, in no acute distress  HEENT: normal  Neck: no JVD, carotid bruits, or masses Cardiac: RRR; no murmurs, rubs, or gallops,no edema  Respiratory:  clear to auscultation bilaterally, normal work of breathing GI: soft, nontender, nondistended, + BS MS: no deformity or atrophy  Skin: warm and dry, no rash Neuro:  Alert and Oriented x  3, Strength and sensation are intact Psych: euthymic mood, full affect  Wt Readings from Last 3 Encounters:  06/18/18 182 lb (82.6 kg)  05/22/18 185 lb 12.8 oz (84.3 kg)  03/25/18 185 lb (83.9 kg)      Studies/Labs Reviewed:   EKG:  EKG is not ordered today.   Recent Labs: 05/22/2018: BUN 11; Creatinine, Ser 0.57; Hemoglobin 14.7; Platelets 262; Potassium 4.0; Sodium 150   Lipid Panel    Component Value Date/Time   CHOL 179 07/13/2014 0108   TRIG 212 (H) 07/13/2014 0108   HDL 36 (L) 07/13/2014 0108   CHOLHDL 5.0 07/13/2014 0108   VLDL 42 (H) 07/13/2014 0108   LDLCALC 101 (H) 07/13/2014 0108    Additional studies/ records that were reviewed today include:   Myoview 03/25/2018  The left ventricular ejection fraction is mildly decreased (45-54%).  Nuclear stress EF: 52%.  No T wave inversion was noted during stress.  There was no ST segment deviation noted during stress.  Defect 1: There is a small defect of moderate severity.  This is a low risk study.   Small size, moderate intensity fixed apical perfusion defect, likely attenuation artifact. No reversible ischemia. LVEF 52% with normal wall motion. This is a low risk study.    ASSESSMENT:    1. Dizziness   2. Essential hypertension   3. Hyperlipidemia, unspecified hyperlipidemia type   4. OSA (obstructive sleep apnea)   5. PAD (peripheral artery disease) ([place])   6. Coronary artery disease involving native coronary artery of native heart without angina pectoris   7. Chronic diastolic heart failure ([facility])      PLAN:  In order of problems listed above:  1. Dizziness: She says sometimes she feels like her head is in a fog.  She also occasionally feels her room is spinning.  Her blood pressure is borderline today, whereas normally her blood pressure is elevated.  Although there is some suspicion of vertigo, however I am more concerned about polypharmacy.  Patient is on scheduled Zoloft and Lyrica, on top of that  she is taking oxycodone and Xanax along with muscle relaxer.  I asked her to limit the amount of oxycodone and Xanax and muscle relaxer only on a as needed basis.  2. CAD: Denies any chest pain or shortness of breath.  Continue aspirin and Plavix.  I have refilled her nitroglycerin.  3. Hypertension: Blood pressure typically high, however today her blood pressure is borderline.  Her symptom of dizziness does not have the classic orthostatic nature.  4. Carotid artery disease: She has a history of mild carotid artery disease, last checked 2015, given the recent dizziness, I will check a carotid Doppler as well  5. Chronic diastolic heart failure: Appears to be euvolemic on physical exam.    Medication Adjustments/Labs and Tests Ordered: Current medicines are reviewed at length with  the patient today.  Concerns regarding medicines are outlined above.  Medication changes, Labs and Tests ordered today are listed in the Patient Instructions below. Patient Instructions  Medication Instructions:  Your physician recommends that you continue on your current medications as directed. Please refer to the Current Medication list given to you today.   Labwork: None ordered  Testing/Procedures: Your physician has requested that you have a carotid duplex. This test is an ultrasound of the carotid arteries in your neck. It looks at blood flow through these arteries that supply the brain with blood. Allow one hour for this exam. There are no restrictions or special instructions.    Follow-Up: Your physician wants you to follow-up WITH DR. [name] ON 12/2 @10 :20 AM.  Any Other Special Instructions Will Be Listed Below (If Applicable).  Please limited pain and anxiety medications    If you need a refill on your cardiac medications before your next appointment, please call your pharmacy.      Hilbert Corrigan, [place]  06/18/2018 12:51 PM    [facility] [place],  [place],   [address] Phone: [phone]; Fax: [phone]

## 2018-06-18 NOTE — Telephone Encounter (Signed)
Returned call to patient Refills sent to pharmacy.

## 2018-06-18 NOTE — Patient Instructions (Addendum)
Medication Instructions:  Your physician recommends that you continue on your current medications as directed. Please refer to the Current Medication list given to you today.   Labwork: None ordered  Testing/Procedures: Your physician has requested that you have a carotid duplex. This test is an ultrasound of the carotid arteries in your neck. It looks at blood flow through these arteries that supply the brain with blood. Allow one hour for this exam. There are no restrictions or special instructions.    Follow-Up: Your physician wants you to follow-up WITH DR. [name] ON 12/2 @10 :20 AM.  Any Other Special Instructions Will Be Listed Below (If Applicable).  Please limited pain and anxiety medications    If you need a refill on your cardiac medications before your next appointment, please call your pharmacy.

## 2018-06-18 NOTE — Telephone Encounter (Signed)
New Message:       Pt c/o medication issue:  1. Name of Medication: nitroGLYCERIN (NITROSTAT) 0.4 MG SL tablet esomeprazole (NEXIUM) 40 MG capsule  2. How are you currently taking this medication (dosage and times per day)? No  3. Are you having a reaction (difficulty breathing--STAT)?   4. What is your medication issue? Pt states the Nitroglycerin was supposed to be sent to the pharmacy today after she left her appt. Pt states she does not want to take the generic version of the Nexium.

## 2018-06-30 ENCOUNTER — Ambulatory Visit (HOSPITAL_COMMUNITY)
Admission: RE | Admit: 2018-06-30 | Payer: Medicare Other | Source: Ambulatory Visit | Attending: Physician Assistant | Admitting: Physician Assistant

## 2018-06-30 ENCOUNTER — Encounter (HOSPITAL_COMMUNITY): Payer: Self-pay

## 2018-08-20 NOTE — Progress Notes (Signed)
Cardiology Office Note    Date:  08/25/2018   ID:  Sonya, Small 1951/08/14, MRN [id number]  PCP:  [name], MD  Cardiologist:  Dr. [name]  Chief Complaint  Patient presents with  . Follow-up    pt having cataract surgery on wednesday, pt c/o dumdness in her body    History of Present Illness:  Sonya Small is a 67 y.o. female with PMH of HTN, HLD, OSA, PAD, CVA, chronic diastolic heart failureand CAD. Patient had a NSTEMI in March 2010 and underwent DES to left circumflex.She had anotherNSTEMI in May 2011 secondary to thrombotic RCA lesion, cardiac catheterization showed nonobstructive disease, medical therapy was recommended. She was admitted for recurrent chest pain in January 2014, Myoview was negative. She was admitted for acute CVA in October 2015 after presenting with slurred speech and facial drooping. MRI showed acute right MCA infarct involving basal ganglia and periventricular white matter. She was started on aspirin along with Plavix. Myoview obtained in August 2017 showed no ischemia, normal EF. She was last seen by Dr. [name] in June 2018, she was very sedentary at the time and notes dyspnea on exertion with any activity. ABI obtained in July 2018 was normal.   She was seen by Almyra Deforest PA-C on 03/13/2018  with chest pain.  A Myoview study on 03/25/2018 which showed EF 52%, small sized moderate intensity fixed apical perfusion defect likely attenuation artifact was seen, no reversible ischemia otherwise.  Overall this is a low risk stress test.  Seen again on 05/22/2018, he was having a lot of dizziness. She was noted to be taking different doses of medication than what was prescribed. BP was stable. Polypharmacy with use of Xanax, oxycodone, Tizanidiine, Zoloft. Dizziness felt to be more related to medication than to a vascular issue. Carotid dopplers were checked and were OK.  On follow up today she is seen with her sister. She lives with her sister now. She  complains that her head feels empty and she is shaky. Memory is poor. Sister notes she sleeps a lot, is more antisocial, and staggers. Concerned about all the pills she is taking. Her muscle relaxant and oxycodone are prescribed by the pain clinic.    Past Medical History:  Diagnosis Date  . Anxiety   . CAD (coronary artery disease)    a. NSTEMI 11/2008 s/p DES to LCx (3.0x12 Xience); b. NSTEMI 01/2010 secondary to thrombotic RCA lesion (non-obstructive)-->med rx (integrilin x 24 hrs + plavix); c. 09/2012 negative Myoview.  . Chronic diastolic CHF (congestive heart failure) ([place])    a. 06/2014 Echo: EF 55-60%, no rwma, Gr1 DD, mild AI.  [name] Depression   . Dizziness   . GERD (gastroesophageal reflux disease)   . Headache   . Hyperlipidemia   . Hypertensive heart disease   . Lumbar disc disease   . Myocardial infarction ([place])   . Obstructive sleep apnea   . OP (osteoporosis)   . Overweight(278.02)   . PAD (peripheral artery disease) ([place])    a. Emboli to R foot 2010 from partially occlusive lesion in R EIA, s/p stenting. - followed by Dr. Donnetta Hutching;  b. 10/2015 ABIs: R 1.03, L 0.97.  [name] Restless leg   . Stroke ([place])   . TIA (transient ischemic attack)   . Tobacco abuse     Past Surgical History:  Procedure Laterality Date  . EYE SURGERY     at age 67  . hysterectomy -age 6    .  ILIAC ARTERY STENT     RIGHT ILIAC STENT  . KNEE ARTHROSCOPY    . LUMBAR LAMINECTOMY    . TUBAL LIGATION      Current Medications: Outpatient Medications Prior to Visit  Medication Sig Dispense Refill  . Aclidinium Bromide (TUDORZA PRESSAIR) 400 MCG/ACT AEPB Inhale 400 mcg into the lungs.    [name] albuterol (PROVENTIL HFA;VENTOLIN HFA) 108 (90 BASE) MCG/ACT inhaler Inhale 2 puffs into the lungs every 4 (four) hours as needed for wheezing or shortness of breath.    . ALPRAZolam (XANAX) 0.5 MG tablet Take 0.5 mg by mouth 2 (two) times daily as needed.     [name] aspirin (ASPIRIN EC) 81 MG EC tablet Take 81 mg by mouth  daily. Swallow whole.    [name] BREO ELLIPTA 200-25 MCG/INH AEPB 1 Inhaler as needed.    . clopidogrel (PLAVIX) 75 MG tablet Take 1 tablet (75 mg total) by mouth daily. 90 tablet 0  . diclofenac sodium (VOLTAREN) 1 % GEL Apply 2 g topically daily as needed (mild pain).    . fenofibrate 160 MG tablet Take 160 mg by mouth daily.    . fluticasone (FLONASE) 50 MCG/ACT nasal spray Place 1 spray into both nostrils daily.    . Fluticasone-Salmeterol (ADVAIR) 250-50 MCG/DOSE AEPB Inhale 1 puff into the lungs 2 (two) times daily.    . furosemide (LASIX) 20 MG tablet Take 20 mg by mouth 2 (two) times daily.    . metoprolol tartrate (LOPRESSOR) 50 MG tablet Take 25 mg by mouth 2 (two) times daily.    . montelukast (SINGULAIR) 10 MG tablet Take 10 mg by mouth at bedtime.    . mupirocin ointment (BACTROBAN) 2 % Place 1 application into the nose 2 (two) times daily.    [name] NEXIUM 40 MG capsule Take 1 capsule (40 mg total) by mouth daily at 12 noon. 30 capsule 6  . nitroGLYCERIN (NITROSTAT) 0.4 MG SL tablet Place 1 tablet (0.4 mg total) under the tongue every 5 (five) minutes as needed. 25 tablet 11  . oxyCODONE-acetaminophen (PERCOCET) 10-325 MG per tablet Take 1 tablet by mouth every 8 (eight) hours as needed for pain.    . polyethylene glycol (MIRALAX / GLYCOLAX) packet Take 17 g by mouth daily.    . potassium chloride SA (K-DUR,KLOR-CON) 20 MEQ tablet Take 20 mEq by mouth daily.    . pregabalin (LYRICA) 50 MG capsule Take 50 mg by mouth 2 (two) times daily.    . rosuvastatin (CRESTOR) 20 MG tablet Take 1 tablet (20 mg total) by mouth daily. 30 tablet 1  . sertraline (ZOLOFT) 100 MG tablet Take 100 mg by mouth daily.    . sitaGLIPtin-metformin (JANUMET) 50-500 MG tablet Take 1 tablet by mouth 2 (two) times daily with a meal.    . tiZANidine (ZANAFLEX) 4 MG tablet Take 4 mg by mouth 2 (two) times daily.    . valACYclovir (VALTREX) 500 MG tablet Take 500 mg by mouth daily as needed (fever blisters).    . VASCEPA 1  g CAPS Take 2 capsules by mouth daily.     . VESICARE 10 MG tablet Take 10 tablets by mouth daily.    [name] buPROPion (WELLBUTRIN XL) 300 MG 24 hr tablet Take 300 mg by mouth daily.     [name] lisinopril (PRINIVIL,ZESTRIL) 10 MG tablet Take 10 mg by mouth daily.    . clotrimazole (MYCELEX) 10 MG troche Take 10 mg by mouth 5 (five) times daily.  No facility-administered medications prior to visit.      Allergies:   Patient has no known allergies.   Social History   Socioeconomic History  . Marital status: Legally Separated    Spouse name: Not on file  . Number of children: 3  . Years of education: 10 th  . Highest education level: Not on file  Occupational History  . Occupation: DISABLED    Employer: UNEMPLOYED  Social Needs  . Financial resource strain: Not on file  . Food insecurity:    Worry: Not on file    Inability: Not on file  . Transportation needs:    Medical: Not on file    Non-medical: Not on file  Tobacco Use  . Smoking status: Former Smoker    Last attempt to quit: 11/20/2012    Years since quitting: 5.7  . Smokeless tobacco: Never Used  Substance and Sexual Activity  . Alcohol use: No    Alcohol/week: 0.0 standard drinks  . Drug use: No  . Sexual activity: Never  Lifestyle  . Physical activity:    Days per week: Not on file    Minutes per session: Not on file  . Stress: Not on file  Relationships  . Social connections:    Talks on phone: Not on file    Gets together: Not on file    Attends religious service: Not on file    Active member of club or organization: Not on file    Attends meetings of clubs or organizations: Not on file    Relationship status: Not on file  Other Topics Concern  . Not on file  Social History Narrative   Patient is single with 3 children, 1 deceased.   Patient is right handed.   Patient has 10 th grade education.   Patient drinks 5 or more cups daily.     Family History:  The patient's family history includes Alzheimer's  disease in her mother; Cancer in her brother; Depression in her brother; Diabetes in her brother; Heart disease in her unknown relative; Hepatitis C in her brother; Hypertension in her brother; Thyroid disease in her brother.   ROS:   Please see the history of present illness.    ROS All other systems reviewed and are negative.   PHYSICAL EXAM:   VS:  BP 101/60   Pulse 67   Ht 5\' 5"  (1.651 m)   Wt 182 lb 6.4 oz (82.7 kg)   BMI 30.35 kg/m    GEN: Well nourished, well developed, in no acute distress  HEENT: normal  Neck: no JVD, carotid bruits, or masses Cardiac: RRR; no murmurs, rubs, or gallops,no edema  Respiratory:  clear to auscultation bilaterally, normal work of breathing GI: soft, nontender, nondistended, + BS MS: no deformity or atrophy  Skin: warm and dry, no rash Neuro:  Alert and Oriented x 3, Strength and sensation are intact Psych: euthymic mood, full affect  Wt Readings from Last 3 Encounters:  08/25/18 182 lb 6.4 oz (82.7 kg)  06/18/18 182 lb (82.6 kg)  05/22/18 185 lb 12.8 oz (84.3 kg)      Studies/Labs Reviewed:   EKG:  EKG is not ordered today.   Recent Labs: 05/22/2018: BUN 11; Creatinine, Ser 0.57; Hemoglobin 14.7; Platelets 262; Potassium 4.0; Sodium 150   Lipid Panel    Component Value Date/Time   CHOL 179 07/13/2014 0108   TRIG 212 (H) 07/13/2014 0108   HDL 36 (L) 07/13/2014 0108   CHOLHDL 5.0  07/13/2014 0108   VLDL 42 (H) 07/13/2014 0108   LDLCALC 101 (H) 07/13/2014 0108   Labs dated 06/17/18: CBC normal except for elevated WBC 13.3.  Creatinine 1.14. Potassium 5.5.  Dated 05/23/18: cholesterol 142, triglycerides 212, HDL 35, LDL 65.  Dated 07/29/18: Normal chemistries and CBC.   Additional studies/ records that were reviewed today include:   Myoview 03/25/2018  The left ventricular ejection fraction is mildly decreased (45-54%).  Nuclear stress EF: 52%.  No T wave inversion was noted during stress.  There was no ST segment deviation  noted during stress.  Defect 1: There is a small defect of moderate severity.  This is a low risk study.   Small size, moderate intensity fixed apical perfusion defect, likely attenuation artifact. No reversible ischemia. LVEF 52% with normal wall motion. This is a low risk study.    ASSESSMENT:    1. Coronary artery disease involving native coronary artery of native heart without angina pectoris   2. Essential hypertension   3. PAD (peripheral artery disease) ([facility])      PLAN:  In order of problems listed above:  1. Polypharmacy. Patient has symptoms of personality changes, sleepiness, shaking, staggering. As has been noted on prior visits here I feel strongly that this is related to the number and combination of centrally active medications she is taking. Really needs to work with primary care and pain clinic to limit the number of meds she is taking.   2. CAD: asymptomatic. Myoview in August 2019 was low risk.  Continue aspirin and Plavix.   3. Hypertension: Blood pressure low. Will reduce lisinopril to 5 mg daily.   4. Carotid artery disease: She has a history of mild carotid artery disease. Unchanged on recent doppler.  5. Chronic diastolic heart failure: Appears to be euvolemic on physical exam.    Medication Adjustments/Labs and Tests Ordered: Current medicines are reviewed at length with the patient today.  Concerns regarding medicines are outlined above.  Medication changes, Labs and Tests ordered today are listed in the Patient Instructions below. Patient Instructions  Reduce lisinopril to 5 mg daily  Try and reduce the amount of muscle relaxants and pain medications you take daily      Signed,  [name], MD  08/25/2018 11:13 AM    [facility] [address], [place], [place]  [address] Phone: [phone]; Fax: [phone]

## 2018-08-25 ENCOUNTER — Encounter: Payer: Self-pay | Admitting: Cardiology

## 2018-08-25 ENCOUNTER — Ambulatory Visit (INDEPENDENT_AMBULATORY_CARE_PROVIDER_SITE_OTHER): Payer: Medicare Other | Admitting: Cardiology

## 2018-08-25 VITALS — BP 101/60 | HR 67 | Ht 65.0 in | Wt 182.4 lb

## 2018-08-25 DIAGNOSIS — I10 Essential (primary) hypertension: Secondary | ICD-10-CM | POA: Diagnosis not present

## 2018-08-25 DIAGNOSIS — I2510 Atherosclerotic heart disease of native coronary artery without angina pectoris: Secondary | ICD-10-CM

## 2018-08-25 DIAGNOSIS — I739 Peripheral vascular disease, unspecified: Secondary | ICD-10-CM | POA: Diagnosis not present

## 2018-08-25 MED ORDER — LISINOPRIL 5 MG PO TABS: 5.0000 mg | ORAL_TABLET | Freq: Every day | ORAL | 3 refills | Status: DC

## 2018-08-25 NOTE — Patient Instructions (Signed)
Reduce lisinopril to 5 mg daily  Try and reduce the amount of muscle relaxants and pain medications you take daily

## 2019-01-31 ENCOUNTER — Inpatient Hospital Stay (HOSPITAL_COMMUNITY): Payer: Medicare Other

## 2019-01-31 ENCOUNTER — Inpatient Hospital Stay (HOSPITAL_COMMUNITY)
Admission: AD | Admit: 2019-01-31 | Discharge: 2019-02-06 | DRG: 871 | Disposition: A | Discharge status: Home Health | Payer: Medicare Other | Source: Other Acute Inpatient Hospital | Attending: Internal Medicine | Admitting: Internal Medicine

## 2019-01-31 DIAGNOSIS — I110 Hypertensive heart disease with heart failure: Secondary | ICD-10-CM | POA: Diagnosis present

## 2019-01-31 DIAGNOSIS — I252 Old myocardial infarction: Secondary | ICD-10-CM

## 2019-01-31 DIAGNOSIS — R6520 Severe sepsis without septic shock: Secondary | ICD-10-CM | POA: Diagnosis present

## 2019-01-31 DIAGNOSIS — Z79899 Other long term (current) drug therapy: Secondary | ICD-10-CM | POA: Diagnosis not present

## 2019-01-31 DIAGNOSIS — Z7951 Long term (current) use of inhaled steroids: Secondary | ICD-10-CM | POA: Diagnosis not present

## 2019-01-31 DIAGNOSIS — F17210 Nicotine dependence, cigarettes, uncomplicated: Secondary | ICD-10-CM | POA: Diagnosis not present

## 2019-01-31 DIAGNOSIS — Z6830 Body mass index (BMI) 30.0-30.9, adult: Secondary | ICD-10-CM

## 2019-01-31 DIAGNOSIS — Z8673 Personal history of transient ischemic attack (TIA), and cerebral infarction without residual deficits: Secondary | ICD-10-CM

## 2019-01-31 DIAGNOSIS — Z833 Family history of diabetes mellitus: Secondary | ICD-10-CM

## 2019-01-31 DIAGNOSIS — I10 Essential (primary) hypertension: Secondary | ICD-10-CM | POA: Diagnosis not present

## 2019-01-31 DIAGNOSIS — M549 Dorsalgia, unspecified: Secondary | ICD-10-CM | POA: Diagnosis present

## 2019-01-31 DIAGNOSIS — Z90710 Acquired absence of both cervix and uterus: Secondary | ICD-10-CM

## 2019-01-31 DIAGNOSIS — E1151 Type 2 diabetes mellitus with diabetic peripheral angiopathy without gangrene: Secondary | ICD-10-CM | POA: Diagnosis present

## 2019-01-31 DIAGNOSIS — E785 Hyperlipidemia, unspecified: Secondary | ICD-10-CM | POA: Diagnosis present

## 2019-01-31 DIAGNOSIS — J9621 Acute and chronic respiratory failure with hypoxia: Secondary | ICD-10-CM | POA: Diagnosis present

## 2019-01-31 DIAGNOSIS — R339 Retention of urine, unspecified: Secondary | ICD-10-CM | POA: Diagnosis present

## 2019-01-31 DIAGNOSIS — A419 Sepsis, unspecified organism: Principal | ICD-10-CM | POA: Diagnosis present

## 2019-01-31 DIAGNOSIS — Z955 Presence of coronary angioplasty implant and graft: Secondary | ICD-10-CM

## 2019-01-31 DIAGNOSIS — K219 Gastro-esophageal reflux disease without esophagitis: Secondary | ICD-10-CM | POA: Diagnosis present

## 2019-01-31 DIAGNOSIS — J309 Allergic rhinitis, unspecified: Secondary | ICD-10-CM | POA: Diagnosis present

## 2019-01-31 DIAGNOSIS — G9341 Metabolic encephalopathy: Secondary | ICD-10-CM | POA: Diagnosis present

## 2019-01-31 DIAGNOSIS — G4733 Obstructive sleep apnea (adult) (pediatric): Secondary | ICD-10-CM | POA: Diagnosis present

## 2019-01-31 DIAGNOSIS — J690 Pneumonitis due to inhalation of food and vomit: Secondary | ICD-10-CM | POA: Diagnosis present

## 2019-01-31 DIAGNOSIS — Z9981 Dependence on supplemental oxygen: Secondary | ICD-10-CM | POA: Diagnosis not present

## 2019-01-31 DIAGNOSIS — I2510 Atherosclerotic heart disease of native coronary artery without angina pectoris: Secondary | ICD-10-CM | POA: Diagnosis present

## 2019-01-31 DIAGNOSIS — J189 Pneumonia, unspecified organism: Secondary | ICD-10-CM | POA: Diagnosis present

## 2019-01-31 DIAGNOSIS — Z8249 Family history of ischemic heart disease and other diseases of the circulatory system: Secondary | ICD-10-CM | POA: Diagnosis not present

## 2019-01-31 DIAGNOSIS — J181 Lobar pneumonia, unspecified organism: Secondary | ICD-10-CM | POA: Diagnosis not present

## 2019-01-31 DIAGNOSIS — E279 Disorder of adrenal gland, unspecified: Secondary | ICD-10-CM | POA: Diagnosis present

## 2019-01-31 DIAGNOSIS — J449 Chronic obstructive pulmonary disease, unspecified: Secondary | ICD-10-CM | POA: Diagnosis present

## 2019-01-31 DIAGNOSIS — G8929 Other chronic pain: Secondary | ICD-10-CM | POA: Diagnosis present

## 2019-01-31 DIAGNOSIS — F329 Major depressive disorder, single episode, unspecified: Secondary | ICD-10-CM | POA: Diagnosis present

## 2019-01-31 DIAGNOSIS — G2581 Restless legs syndrome: Secondary | ICD-10-CM | POA: Diagnosis present

## 2019-01-31 DIAGNOSIS — E876 Hypokalemia: Secondary | ICD-10-CM | POA: Diagnosis not present

## 2019-01-31 DIAGNOSIS — N289 Disorder of kidney and ureter, unspecified: Secondary | ICD-10-CM | POA: Diagnosis present

## 2019-01-31 DIAGNOSIS — J9601 Acute respiratory failure with hypoxia: Secondary | ICD-10-CM | POA: Diagnosis not present

## 2019-01-31 DIAGNOSIS — J9600 Acute respiratory failure, unspecified whether with hypoxia or hypercapnia: Secondary | ICD-10-CM | POA: Diagnosis present

## 2019-01-31 DIAGNOSIS — Z7902 Long term (current) use of antithrombotics/antiplatelets: Secondary | ICD-10-CM

## 2019-01-31 DIAGNOSIS — Z20828 Contact with and (suspected) exposure to other viral communicable diseases: Secondary | ICD-10-CM | POA: Diagnosis present

## 2019-01-31 DIAGNOSIS — Z7982 Long term (current) use of aspirin: Secondary | ICD-10-CM

## 2019-01-31 DIAGNOSIS — E669 Obesity, unspecified: Secondary | ICD-10-CM | POA: Diagnosis present

## 2019-01-31 DIAGNOSIS — I5032 Chronic diastolic (congestive) heart failure: Secondary | ICD-10-CM | POA: Diagnosis present

## 2019-01-31 DIAGNOSIS — M810 Age-related osteoporosis without current pathological fracture: Secondary | ICD-10-CM | POA: Diagnosis present

## 2019-01-31 DIAGNOSIS — I160 Hypertensive urgency: Secondary | ICD-10-CM | POA: Diagnosis not present

## 2019-01-31 LAB — POCT I-STAT 7, (LYTES, BLD GAS, ICA,H+H)
Acid-base deficit: 4 mmol/L — ABNORMAL HIGH (ref 0.0–2.0)
Bicarbonate: 21 mmol/L (ref 20.0–28.0)
Calcium, Ion: 1.11 mmol/L — ABNORMAL LOW (ref 1.15–1.40)
HCT: 42 % (ref 36.0–46.0)
Hemoglobin: 14.3 g/dL (ref 12.0–15.0)
O2 Saturation: 98 %
Potassium: 4.5 mmol/L (ref 3.5–5.1)
Sodium: 144 mmol/L (ref 135–145)
TCO2: 22 mmol/L (ref 22–32)
pCO2 arterial: 36 mmHg (ref 32.0–48.0)
pH, Arterial: 7.374 (ref 7.350–7.450)
pO2, Arterial: 110 mmHg — ABNORMAL HIGH (ref 83.0–108.0)

## 2019-01-31 LAB — COMPREHENSIVE METABOLIC PANEL
ALT: 36 U/L (ref 0–44)
AST: 51 U/L — ABNORMAL HIGH (ref 15–41)
Albumin: 3.1 g/dL — ABNORMAL LOW (ref 3.5–5.0)
Alkaline Phosphatase: 65 U/L (ref 38–126)
Anion gap: 9 (ref 5–15)
BUN: 15 mg/dL (ref 8–23)
CO2: 22 mmol/L (ref 22–32)
Calcium: 8 mg/dL — ABNORMAL LOW (ref 8.9–10.3)
Chloride: 112 mmol/L — ABNORMAL HIGH (ref 98–111)
Creatinine, Ser: 1.14 mg/dL — ABNORMAL HIGH (ref 0.44–1.00)
GFR calc Af Amer: 57 mL/min — ABNORMAL LOW (ref >60)
GFR calc non Af Amer: 49 mL/min — ABNORMAL LOW (ref >60)
Glucose, Bld: 164 mg/dL — ABNORMAL HIGH (ref 70–99)
Potassium: 4.5 mmol/L (ref 3.5–5.1)
Sodium: 143 mmol/L (ref 135–145)
Total Bilirubin: 0.5 mg/dL (ref 0.3–1.2)
Total Protein: 5.8 g/dL — ABNORMAL LOW (ref 6.5–8.1)

## 2019-01-31 LAB — CBC WITH DIFFERENTIAL/PLATELET
Abs Immature Granulocytes: 0.31 10*3/uL — ABNORMAL HIGH (ref 0.00–0.07)
Basophils Absolute: 0 10*3/uL (ref 0.0–0.1)
Basophils Relative: 0 %
Eosinophils Absolute: 0 10*3/uL (ref 0.0–0.5)
Eosinophils Relative: 0 %
HCT: 46.9 % — ABNORMAL HIGH (ref 36.0–46.0)
Hemoglobin: 14.5 g/dL (ref 12.0–15.0)
Immature Granulocytes: 2 %
Lymphocytes Relative: 6 %
Lymphs Abs: 0.9 10*3/uL (ref 0.7–4.0)
MCH: 27.8 pg (ref 26.0–34.0)
MCHC: 30.9 g/dL (ref 30.0–36.0)
MCV: 90 fL (ref 80.0–100.0)
Monocytes Absolute: 0.9 10*3/uL (ref 0.1–1.0)
Monocytes Relative: 6 %
Neutro Abs: 12.6 10*3/uL — ABNORMAL HIGH (ref 1.7–7.7)
Neutrophils Relative %: 86 %
Platelets: 247 10*3/uL (ref 150–400)
RBC: 5.21 MIL/uL — ABNORMAL HIGH (ref 3.87–5.11)
RDW: 13.4 % (ref 11.5–15.5)
WBC: 14.7 10*3/uL — ABNORMAL HIGH (ref 4.0–10.5)
nRBC: 0 % (ref 0.0–0.2)

## 2019-01-31 LAB — PROTIME-INR
INR: 1 (ref 0.8–1.2)
Prothrombin Time: 13.4 seconds (ref 11.4–15.2)

## 2019-01-31 LAB — MAGNESIUM: Magnesium: 1.8 mg/dL (ref 1.7–2.4)

## 2019-01-31 LAB — LACTIC ACID, PLASMA
Lactic Acid, Venous: 2.1 mmol/L (ref 0.5–1.9)
Lactic Acid, Venous: 1.6 mmol/L (ref 0.5–1.9)

## 2019-01-31 LAB — PHOSPHORUS: Phosphorus: 4.1 mg/dL (ref 2.5–4.6)

## 2019-01-31 LAB — GLUCOSE, CAPILLARY
Glucose-Capillary: 213 mg/dL — ABNORMAL HIGH (ref 70–99)
Glucose-Capillary: 159 mg/dL — ABNORMAL HIGH (ref 70–99)
Glucose-Capillary: 152 mg/dL — ABNORMAL HIGH (ref 70–99)

## 2019-01-31 LAB — MRSA PCR SCREENING: MRSA by PCR: NEGATIVE

## 2019-01-31 LAB — STREP PNEUMONIAE URINARY ANTIGEN: Strep Pneumo Urinary Antigen: NEGATIVE

## 2019-01-31 LAB — BRAIN NATRIURETIC PEPTIDE: B Natriuretic Peptide: 299.1 pg/mL — ABNORMAL HIGH (ref 0.0–100.0)

## 2019-01-31 MED ORDER — PRO-STAT SUGAR FREE PO LIQD
30.0000 mL | Freq: Two times a day (BID) | ORAL | Status: DC
  Administered 2019-01-31 – 2019-02-01 (×3): 30 mL
  Filled 2019-01-31 (×3): qty 30

## 2019-01-31 MED ORDER — FENTANYL 2500MCG IN NS 250ML (10MCG/ML) PREMIX INFUSION
25.0000 ug/h | INTRAVENOUS | Status: DC
  Administered 2019-01-31: 200 ug/h via INTRAVENOUS
  Administered 2019-02-01: 08:00:00 100 ug/h via INTRAVENOUS
  Filled 2019-01-31 (×2): qty 250

## 2019-01-31 MED ORDER — DOCUSATE SODIUM 50 MG/5ML PO LIQD: 100.0000 mg | Freq: Two times a day (BID) | ORAL | Status: DC | PRN

## 2019-01-31 MED ORDER — IPRATROPIUM-ALBUTEROL 0.5-2.5 (3) MG/3ML IN SOLN
3.0000 mL | Freq: Four times a day (QID) | RESPIRATORY_TRACT | Status: DC
  Administered 2019-01-31 – 2019-02-01 (×4): 3 mL via RESPIRATORY_TRACT
  Filled 2019-01-31 (×4): qty 3

## 2019-01-31 MED ORDER — CHLORHEXIDINE GLUCONATE 0.12% ORAL RINSE (MEDLINE KIT)
15.0000 mL | Freq: Two times a day (BID) | OROMUCOSAL | Status: DC
  Administered 2019-01-31 – 2019-02-01 (×3): 15 mL via OROMUCOSAL

## 2019-01-31 MED ORDER — NICOTINE 21 MG/24HR TD PT24
21.0000 mg | MEDICATED_PATCH | Freq: Every day | TRANSDERMAL | Status: DC
  Administered 2019-01-31 – 2019-02-01 (×2): 21 mg via TRANSDERMAL
  Filled 2019-01-31 (×3): qty 1

## 2019-01-31 MED ORDER — ONDANSETRON HCL 4 MG/2ML IJ SOLN
4.0000 mg | Freq: Four times a day (QID) | INTRAMUSCULAR | Status: DC | PRN
  Administered 2019-02-01 – 2019-02-02 (×2): 4 mg via INTRAVENOUS
  Filled 2019-01-31 (×2): qty 2

## 2019-01-31 MED ORDER — HYDROMORPHONE HCL 1 MG/ML IJ SOLN: 0.5000 mg | INTRAMUSCULAR | Status: DC | PRN

## 2019-01-31 MED ORDER — MIDAZOLAM HCL 2 MG/2ML IJ SOLN
1.0000 mg | INTRAMUSCULAR | Status: DC | PRN
  Administered 2019-01-31 – 2019-02-01 (×3): 1 mg via INTRAVENOUS
  Filled 2019-01-31 (×3): qty 2

## 2019-01-31 MED ORDER — SODIUM CHLORIDE 0.9 % IV SOLN
2.0000 g | INTRAVENOUS | Status: DC
  Administered 2019-02-01: 2 g via INTRAVENOUS
  Filled 2019-01-31 (×2): qty 20

## 2019-01-31 MED ORDER — SODIUM CHLORIDE 0.9 % IV SOLN
500.0000 mg | INTRAVENOUS | Status: DC
  Administered 2019-01-31 – 2019-02-01 (×2): 500 mg via INTRAVENOUS
  Filled 2019-01-31 (×3): qty 500

## 2019-01-31 MED ORDER — ORAL CARE MOUTH RINSE
15.0000 mL | OROMUCOSAL | Status: DC
  Administered 2019-01-31 – 2019-02-01 (×12): 15 mL via OROMUCOSAL

## 2019-01-31 MED ORDER — ALBUTEROL SULFATE (2.5 MG/3ML) 0.083% IN NEBU: 2.5000 mg | INHALATION_SOLUTION | RESPIRATORY_TRACT | Status: DC | PRN

## 2019-01-31 MED ORDER — LACTATED RINGERS IV SOLN
INTRAVENOUS | Status: DC
  Administered 2019-01-31 – 2019-02-01 (×2): via INTRAVENOUS

## 2019-01-31 MED ORDER — INSULIN ASPART 100 UNIT/ML ~~LOC~~ SOLN
2.0000 [IU] | SUBCUTANEOUS | Status: DC
  Administered 2019-01-31 (×2): 4 [IU] via SUBCUTANEOUS
  Administered 2019-02-01: 2 [IU] via SUBCUTANEOUS
  Administered 2019-02-01 (×5): 4 [IU] via SUBCUTANEOUS
  Administered 2019-02-02 (×2): 2 [IU] via SUBCUTANEOUS

## 2019-01-31 MED ORDER — ACETAMINOPHEN 325 MG PO TABS: 650.0000 mg | ORAL_TABLET | ORAL | Status: DC | PRN

## 2019-01-31 MED ORDER — SODIUM CHLORIDE 0.9 % IV SOLN
1.0000 g | Freq: Once | INTRAVENOUS | Status: AC
  Administered 2019-01-31: 1 g via INTRAVENOUS
  Filled 2019-01-31: qty 10

## 2019-01-31 MED ORDER — MIDAZOLAM HCL 2 MG/2ML IJ SOLN: 1.0000 mg | INTRAMUSCULAR | Status: DC | PRN

## 2019-01-31 MED ORDER — VITAL HIGH PROTEIN PO LIQD
1000.0000 mL | ORAL | Status: DC
  Administered 2019-01-31: 1000 mL

## 2019-01-31 MED ORDER — FAMOTIDINE 20 MG PO TABS
20.0000 mg | ORAL_TABLET | Freq: Two times a day (BID) | ORAL | Status: DC
  Administered 2019-01-31 – 2019-02-01 (×4): 20 mg via ORAL
  Filled 2019-01-31 (×4): qty 1

## 2019-01-31 MED ORDER — BISACODYL 10 MG RE SUPP: 10.0000 mg | Freq: Every day | RECTAL | Status: DC | PRN

## 2019-01-31 MED ORDER — FENTANYL BOLUS VIA INFUSION
25.0000 ug | INTRAVENOUS | Status: DC | PRN
  Administered 2019-01-31 – 2019-02-01 (×4): 25 ug via INTRAVENOUS
  Filled 2019-01-31: qty 25

## 2019-01-31 MED ORDER — ENOXAPARIN SODIUM 40 MG/0.4ML ~~LOC~~ SOLN
40.0000 mg | SUBCUTANEOUS | Status: DC
  Administered 2019-01-31: 40 mg via SUBCUTANEOUS
  Filled 2019-01-31: qty 0.4

## 2019-01-31 NOTE — Progress Notes (Signed)
CRITICAL VALUE ALERT  Critical Value:  Lactic acid 2.1  Date & Time Notied:  01/31/19 4:45 PM  Provider Notified: Dr. Lynetta Mare  Orders Received/Actions taken: Continue treatment and monitor.

## 2019-01-31 NOTE — Progress Notes (Signed)
ABG results shown to Eric Form NP. Notified NP of pt not being on 8cc Vt of 450. Verbal order received to change pt to corrected Vt and lower Fio2 as tolerated and to obtain AM ABG.

## 2019-01-31 NOTE — Progress Notes (Signed)
Pt arrived from [facility] via [place]. Pt placed on current settings from [facility]: PRVC: RR24, Vt 500 +5 70%.   Per [facility] ABG on PRVC 24/500/+6/100% was: pH 7.23 PCO2 50 PO2 200+ HCO3 20.9

## 2019-01-31 NOTE — H&P (Addendum)
PULMONARY / CRITICAL CARE MEDICINE   NAME:  Sonya Small, MRN:  [id number], DOB:  Feb 13, 1951, LOS: 0 ADMISSION DATE:  01/31/2019, CONSULTATION DATE:  01/31/2019 REFERRING QQ:[id number] ED  , CHIEF COMPLAINT:  Acute on Chronic Respiratory Failure with hypoxia / New Right infiltrate/ AMS  BRIEF HISTORY:    68 year old female, current every day smoker, COPD with multiple co morbidities,   told her family she felt short of breath this morning and suddenly collapsed.  There is limited data from the family and the patient was unresponsive on arrival to [facility].  At the time she was being bagged with a BVM and  patient had a Glasgow Coma Scale of 3. She required emergent intubation in the ED. She was given  2 mg of Narcan in the field with no response. Urine drug screen was negative,  patient was mottled on arrival to [facility] ED. Imaging revealed pneumonia. Pt, was stabilized and transferred to [facility] for further care. PCCM were asked to admit and manage patient upon arrival. HISTORY OF PRESENT ILLNESS   68 year old female, current every day smoker,  told her family she felt short of breath this morning and suddenly collapsed.  There is limited data from the family and the patient was unresponsive on arrival to [facility].  At the time she was being bagged with a BVM and  patient had a Glasgow Coma Scale of 3. She required emergent intubation. She was given  2 mg of Narcan in the field with no response.  Urine drug screen was negative patient was mottled on arrival to [facility] ED. Lactate was 10.9, WBC was 21.3. CT chest revealed airspace opacities in the dependent portion of the lobes of the lungs in the right peribronchovascular region appearance favors multifocal pneumonia versus atelectasis, possible component of interstitial edema at the bases.  Additionally there was notation of small bilateral pleural effusions and mild cardiomegaly.  There is a right adrenal mass that is stable since 2014 and  considered benign.  Left IJ triple-lumen was inserted, patient was treated with 2 L normal saline and received a dose of Rocephin 1 gram. She was transferred to [facility] for further management and care.PCCM were asked to admit and manage patient care. SIGNIFICANT PAST MEDICAL HISTORY    Past Medical History:  Diagnosis Date  . Anxiety   . CAD (coronary artery disease)    a. NSTEMI 11/2008 s/p DES to LCx (3.0x12 Xience); b. NSTEMI 01/2010 secondary to thrombotic RCA lesion (non-obstructive)-->med rx (integrilin x 24 hrs + plavix); c. 09/2012 negative Myoview.  . Chronic diastolic CHF (congestive heart failure) ([place])    a. 06/2014 Echo: EF 55-60%, no rwma, Gr1 DD, mild AI.  [name] Depression   . Dizziness   . GERD (gastroesophageal reflux disease)   . Headache   . Hyperlipidemia   . Hypertensive heart disease   . Lumbar disc disease   . Myocardial infarction ([place])   . Obstructive sleep apnea   . OP (osteoporosis)   . Overweight(278.02)   . PAD (peripheral artery disease) ([place])    a. Emboli to R foot 2010 from partially occlusive lesion in R EIA, s/p stenting. - followed by Dr. Donnetta Hutching;  b. 10/2015 ABIs: R 1.03, L 0.97.  [name] Restless leg   . Stroke ([place])   . TIA (transient ischemic attack)   . Tobacco abuse     SIGNIFICANT EVENTS:  01/31/2019>> Admission [place] STUDIES:   01/31/2019 CT Chest [facility]  Airspace opacities in the dependent portion of the lobes of the lungs in the right peribronchovascular region appearance favors multifocal pneumonia versus atelectasis, possible component of interstitial edema at the bases.  Additionally there was notation of small bilateral pleural effusions and mild cardiomegaly.  There is a right adrenal mass that is stable since 2014 and considered benign. 01/31/2019 CXR  Apparent improvement in RIGHT lung airspace disease. Otherwise unchanged appearance of the chest.  CT head 01/31/2019 No acute intracranial findings White matter disease stable,  suggestive of small vessel ischemia Chronic left maxillary sinus inflammation  CULTURES:  5/9>> Respiratory 5/9>> Blood  ANTIBIOTICS:  01/31/2019 Rocephin x 1 [place] 01/31/2019 Rocephin>> 01/31/2019 Azithro>>  LINES/TUBES:  01/31/2019 ETT>> 01/31/2019: L IJ triple lumen CONSULTANTS:  PCCM SUBJECTIVE:  Unable, intubated and sedated. Appears comfortable on vent 70%, TV 500, PEEP 5, Rate 24 Sats 99%  CONSTITUTIONAL: Pulse 68   Resp (!) 24   SpO2 100%   No intake/output data recorded.     Vent Mode: PRVC FiO2 (%):  [70 %] 70 % Set Rate:  [24 bmp] 24 bmp Vt Set:  [500 mL] 500 mL PEEP:  [5 cmH20] 5 cmH20 Plateau Pressure:  [25 cmH20] 25 cmH20  PHYSICAL EXAM: General: Sedated and intubated elderly female , in NAD Neuro: Sedated, Arouses to call of name, MAE x 4, nods appropriately HEENT:  NCAT, ETT secure, OG tube secure, No LAD, No JVD Cardiovascular:  S1, S2, RRR, No RMG, NSR per tele Lungs:  Bilateral Chest Excursion, Coarse throughout with wheezes noted, diminished per bases Abdomen:Soft, NT, ND, BS +, Obese Musculoskeletal:  No obvious deformities noted Skin:  Cool to touch, intact, No rash or lesions noted  RESOLVED PROBLEM LIST   ASSESSMENT AND PLAN   Acute on Chronic Respiratory Failure with Hypoxemia  New CAP per CT chest 5/9 COPD on home O2 at 2 L Received 1 gram Rocephin at OSH Current every day smoker Plan Full vent support 5/9 ABG now VAP protocol Wean FiO2 and PEEP for sats > 90 ( COPD) Trend CXR Sedation for RASS of -1 Check Mag Scheduled Duonebs Consider Solumedrol if continued Bronchospasm   Lactate of 10.9 BP  Soft, No pressors at present Sedation may be contributing Hx CHF Plan Received 2 L IVF at OSH LR at 50 cc/hr Trend Lactate MAP goal > 65, add pressors as needed  BNP Consider troponin monitoring   CAP Per CT 5/9 WBC 21,000 Plan Sputum Cx now Blood Cx now Urine for Legionella and Strep Azithro/ Rocephin IV as above Trend  PCT Follow Micro for sensitivities Trend fever Curve/ WBC Re-culture as is clinically indicated  Renal Adequate UO Plan Strict I&O Trend BMET Replete electrolytes as needed Maintain renal perfusion Avoid nephrotoxic medications  Elevated LDH>> 845 Plan Trend LDH for clearance Consider Troponin monitoring  GI Plan Start TF in am 5/10  DM BMI 30.96 Plan HGB A1C CBG Q5 SSI  AMS Following commands at present CT Head negative for Acute intracranial findings Plan Neuro checks per unit Wean sedation as able  Family ( Sister)  updated by RN 01/31/2019   SUMMARY OF TODAY'S PLAN:  Admitted from [place] with Acute on Chronic Resp Failure with Hypoxemia/ New CAP. Will start Azithro and Rocephin Trend lactate and PCT CXR and ABG now>> Weaned to 55%, Sat goals are > 90%   Best Practice / Goals of Care / Disposition.   DVT PROPHYLAXIS:Lovenox EHM:CNOBSJ NUTRITION:NPO/ TF MOBILITY:BR GOALS OF CARE:Full Code FAMILY DISCUSSIONS: Sister updated  by RN DISPOSITION ICU  LABS  Glucose Recent Labs  Lab 01/31/19 1324  GLUCAP 213*    BMET No results for input(s): NA, K, CL, CO2, BUN, CREATININE, GLUCOSE in the last 168 hours.  Liver Enzymes No results for input(s): AST, ALT, ALKPHOS, BILITOT, ALBUMIN in the last 168 hours.  Electrolytes No results for input(s): CALCIUM, MG, PHOS in the last 168 hours.  CBC No results for input(s): WBC, HGB, HCT, PLT in the last 168 hours.  ABG No results for input(s): PHART, PCO2ART, PO2ART in the last 168 hours.  Coag's No results for input(s): APTT, INR in the last 168 hours.  Sepsis Markers No results for input(s): LATICACIDVEN, PROCALCITON, O2SATVEN in the last 168 hours.  Cardiac Enzymes No results for input(s): TROPONINI, PROBNP in the last 168 hours.  PAST MEDICAL HISTORY :   She  has a past medical history of Anxiety, CAD (coronary artery disease), Chronic diastolic CHF (congestive heart failure) ([place]),  Depression, Dizziness, GERD (gastroesophageal reflux disease), Headache, Hyperlipidemia, Hypertensive heart disease, Lumbar disc disease, Myocardial infarction ([place]), Obstructive sleep apnea, OP (osteoporosis), Overweight(278.02), PAD (peripheral artery disease) ([place]), Restless leg, Stroke ([place]), TIA (transient ischemic attack), and Tobacco abuse.  PAST SURGICAL HISTORY:  She  has a past surgical history that includes Iliac artery stent; Knee arthroscopy; Lumbar laminectomy; Tubal ligation; hysterectomy -age 79; and Eye surgery.  No Known Allergies  No current facility-administered medications on file prior to encounter.    Current Outpatient Medications on File Prior to Encounter  Medication Sig  . Aclidinium Bromide (TUDORZA PRESSAIR) 400 MCG/ACT AEPB Inhale 400 mcg into the lungs.  [name] albuterol (PROVENTIL HFA;VENTOLIN HFA) 108 (90 BASE) MCG/ACT inhaler Inhale 2 puffs into the lungs every 4 (four) hours as needed for wheezing or shortness of breath.  . ALPRAZolam (XANAX) 0.5 MG tablet Take 0.5 mg by mouth 2 (two) times daily as needed.   [name] aspirin (ASPIRIN EC) 81 MG EC tablet Take 81 mg by mouth daily. Swallow whole.  [name] BREO ELLIPTA 200-25 MCG/INH AEPB 1 Inhaler as needed.  . clopidogrel (PLAVIX) 75 MG tablet Take 1 tablet (75 mg total) by mouth daily.  . diclofenac sodium (VOLTAREN) 1 % GEL Apply 2 g topically daily as needed (mild pain).  . fenofibrate 160 MG tablet Take 160 mg by mouth daily.  . fluticasone (FLONASE) 50 MCG/ACT nasal spray Place 1 spray into both nostrils daily.  . Fluticasone-Salmeterol (ADVAIR) 250-50 MCG/DOSE AEPB Inhale 1 puff into the lungs 2 (two) times daily.  . furosemide (LASIX) 20 MG tablet Take 20 mg by mouth 2 (two) times daily.  [name] lisinopril (PRINIVIL,ZESTRIL) 5 MG tablet Take 1 tablet (5 mg total) by mouth daily.  . metoprolol tartrate (LOPRESSOR) 50 MG tablet Take 25 mg by mouth 2 (two) times daily.  . montelukast (SINGULAIR) 10 MG tablet Take 10 mg by  mouth at bedtime.  . mupirocin ointment (BACTROBAN) 2 % Place 1 application into the nose 2 (two) times daily.  [name] NEXIUM 40 MG capsule Take 1 capsule (40 mg total) by mouth daily at 12 noon.  . nitroGLYCERIN (NITROSTAT) 0.4 MG SL tablet Place 1 tablet (0.4 mg total) under the tongue every 5 (five) minutes as needed.  [name] oxyCODONE-acetaminophen (PERCOCET) 10-325 MG per tablet Take 1 tablet by mouth every 8 (eight) hours as needed for pain.  . polyethylene glycol (MIRALAX / GLYCOLAX) packet Take 17 g by mouth daily.  . potassium chloride SA (K-DUR,KLOR-CON) 20 MEQ tablet Take 20 mEq  by mouth daily.  . pregabalin (LYRICA) 50 MG capsule Take 50 mg by mouth 2 (two) times daily.  . rosuvastatin (CRESTOR) 20 MG tablet Take 1 tablet (20 mg total) by mouth daily.  . sertraline (ZOLOFT) 100 MG tablet Take 100 mg by mouth daily.  . sitaGLIPtin-metformin (JANUMET) 50-500 MG tablet Take 1 tablet by mouth 2 (two) times daily with a meal.  . tiZANidine (ZANAFLEX) 4 MG tablet Take 4 mg by mouth 2 (two) times daily.  . valACYclovir (VALTREX) 500 MG tablet Take 500 mg by mouth daily as needed (fever blisters).  . VASCEPA 1 g CAPS Take 2 capsules by mouth daily.   . VESICARE 10 MG tablet Take 10 tablets by mouth daily.    FAMILY HISTORY:   Her family history includes Alzheimer's disease in her mother; Cancer in her brother; Depression in her brother; Diabetes in her brother; Heart disease in her unknown relative; Hepatitis C in her brother; Hypertension in her brother; Thyroid disease in her brother.  SOCIAL HISTORY:  She  reports that she quit smoking about 6 years ago. She has never used smokeless tobacco. She reports that she does not drink alcohol or use drugs.  REVIEW OF SYSTEMS:    Unable, Patient intubated and sedated  CCM APP time 60 minutes  Magdalen Spatz, AGACNP-BC [place] Pager # [phone] After 4 pm please call [phone] 01/31/2019 3:27 PM

## 2019-02-01 ENCOUNTER — Encounter (HOSPITAL_COMMUNITY): Payer: Self-pay

## 2019-02-01 ENCOUNTER — Inpatient Hospital Stay (HOSPITAL_COMMUNITY): Payer: Medicare Other

## 2019-02-01 DIAGNOSIS — J181 Lobar pneumonia, unspecified organism: Secondary | ICD-10-CM

## 2019-02-01 LAB — CBC
HCT: 39.1 % (ref 36.0–46.0)
Hemoglobin: 12.2 g/dL (ref 12.0–15.0)
MCH: 28.3 pg (ref 26.0–34.0)
MCHC: 31.2 g/dL (ref 30.0–36.0)
MCV: 90.7 fL (ref 80.0–100.0)
Platelets: 177 10*3/uL (ref 150–400)
RBC: 4.31 MIL/uL (ref 3.87–5.11)
RDW: 13.5 % (ref 11.5–15.5)
WBC: 13.1 10*3/uL — ABNORMAL HIGH (ref 4.0–10.5)
nRBC: 0 % (ref 0.0–0.2)

## 2019-02-01 LAB — BLOOD GAS, ARTERIAL
Acid-base deficit: 1.2 mmol/L (ref 0.0–2.0)
Bicarbonate: 22.8 mmol/L (ref 20.0–28.0)
Drawn by: 331001
FIO2: 40
MECHVT: 450 mL
O2 Saturation: 97.4 %
PEEP: 5 cmH2O
Patient temperature: 99
RATE: 24 resp/min
pCO2 arterial: 37.7 mmHg (ref 32.0–48.0)
pH, Arterial: 7.4 (ref 7.350–7.450)
pO2, Arterial: 97.1 mmHg (ref 83.0–108.0)

## 2019-02-01 LAB — HEMOGLOBIN A1C
Hgb A1c MFr Bld: 7.6 % — ABNORMAL HIGH (ref 4.8–5.6)
Mean Plasma Glucose: 171.42 mg/dL

## 2019-02-01 LAB — BASIC METABOLIC PANEL
Anion gap: 10 (ref 5–15)
BUN: 28 mg/dL — ABNORMAL HIGH (ref 8–23)
CO2: 22 mmol/L (ref 22–32)
Calcium: 8.4 mg/dL — ABNORMAL LOW (ref 8.9–10.3)
Chloride: 112 mmol/L — ABNORMAL HIGH (ref 98–111)
Creatinine, Ser: 1.15 mg/dL — ABNORMAL HIGH (ref 0.44–1.00)
GFR calc Af Amer: 56 mL/min — ABNORMAL LOW (ref >60)
GFR calc non Af Amer: 49 mL/min — ABNORMAL LOW (ref >60)
Glucose, Bld: 202 mg/dL — ABNORMAL HIGH (ref 70–99)
Potassium: 3.9 mmol/L (ref 3.5–5.1)
Sodium: 144 mmol/L (ref 135–145)

## 2019-02-01 LAB — GLUCOSE, CAPILLARY
Glucose-Capillary: 118 mg/dL — ABNORMAL HIGH (ref 70–99)
Glucose-Capillary: 148 mg/dL — ABNORMAL HIGH (ref 70–99)
Glucose-Capillary: 161 mg/dL — ABNORMAL HIGH (ref 70–99)
Glucose-Capillary: 174 mg/dL — ABNORMAL HIGH (ref 70–99)
Glucose-Capillary: 186 mg/dL — ABNORMAL HIGH (ref 70–99)
Glucose-Capillary: 191 mg/dL — ABNORMAL HIGH (ref 70–99)
Glucose-Capillary: 196 mg/dL — ABNORMAL HIGH (ref 70–99)

## 2019-02-01 LAB — PROCALCITONIN: Procalcitonin: 3.06 ng/mL

## 2019-02-01 LAB — MAGNESIUM: Magnesium: 1.7 mg/dL (ref 1.7–2.4)

## 2019-02-01 LAB — HIV ANTIBODY (ROUTINE TESTING W REFLEX): HIV Screen 4th Generation wRfx: NONREACTIVE

## 2019-02-01 LAB — LACTATE DEHYDROGENASE: LDH: 159 U/L (ref 98–192)

## 2019-02-01 MED ORDER — TRAMADOL HCL 50 MG PO TABS
50.0000 mg | ORAL_TABLET | Freq: Four times a day (QID) | ORAL | Status: DC | PRN
  Administered 2019-02-01 – 2019-02-06 (×5): 50 mg via ORAL
  Filled 2019-02-01 (×5): qty 1

## 2019-02-01 MED ORDER — METOPROLOL TARTRATE 25 MG PO TABS
25.0000 mg | ORAL_TABLET | Freq: Every day | ORAL | Status: DC
  Administered 2019-02-01 – 2019-02-06 (×6): 25 mg via ORAL
  Filled 2019-02-01: qty 1
  Filled 2019-02-01: qty 2
  Filled 2019-02-01 (×2): qty 1
  Filled 2019-02-01: qty 2
  Filled 2019-02-01: qty 1

## 2019-02-01 MED ORDER — LABETALOL HCL 5 MG/ML IV SOLN
10.0000 mg | INTRAVENOUS | Status: DC | PRN
  Administered 2019-02-01 – 2019-02-04 (×5): 10 mg via INTRAVENOUS
  Filled 2019-02-01 (×5): qty 4

## 2019-02-01 MED ORDER — HYDRALAZINE HCL 20 MG/ML IJ SOLN
10.0000 mg | Freq: Four times a day (QID) | INTRAMUSCULAR | Status: DC | PRN
  Administered 2019-02-02 – 2019-02-04 (×3): 20 mg via INTRAVENOUS
  Administered 2019-02-06: 10 mg via INTRAVENOUS
  Filled 2019-02-01 (×4): qty 1

## 2019-02-01 MED ORDER — IPRATROPIUM-ALBUTEROL 0.5-2.5 (3) MG/3ML IN SOLN
3.0000 mL | Freq: Four times a day (QID) | RESPIRATORY_TRACT | Status: DC
  Administered 2019-02-01 – 2019-02-02 (×4): 3 mL via RESPIRATORY_TRACT
  Filled 2019-02-01 (×4): qty 3

## 2019-02-01 MED ORDER — HYDRALAZINE HCL 20 MG/ML IJ SOLN
10.0000 mg | Freq: Once | INTRAMUSCULAR | Status: AC
  Administered 2019-02-01: 12:00:00 10 mg via INTRAVENOUS
  Filled 2019-02-01: qty 1

## 2019-02-01 MED ORDER — ORAL CARE MOUTH RINSE
15.0000 mL | Freq: Two times a day (BID) | OROMUCOSAL | Status: DC
  Administered 2019-02-02 – 2019-02-06 (×7): 15 mL via OROMUCOSAL

## 2019-02-01 MED ORDER — HYDRALAZINE HCL 20 MG/ML IJ SOLN
10.0000 mg | Freq: Once | INTRAMUSCULAR | Status: AC
  Administered 2019-02-01: 09:00:00 10 mg via INTRAVENOUS
  Filled 2019-02-01: qty 1

## 2019-02-01 MED ORDER — FUROSEMIDE 10 MG/ML IJ SOLN
20.0000 mg | Freq: Once | INTRAMUSCULAR | Status: AC
  Administered 2019-02-01: 16:00:00 20 mg via INTRAVENOUS
  Filled 2019-02-01: qty 2

## 2019-02-01 MED ORDER — CHLORHEXIDINE GLUCONATE CLOTH 2 % EX PADS
6.0000 | MEDICATED_PAD | Freq: Every day | CUTANEOUS | Status: DC
  Administered 2019-02-01 – 2019-02-06 (×4): 6 via TOPICAL

## 2019-02-01 MED ORDER — LISINOPRIL 5 MG PO TABS
5.0000 mg | ORAL_TABLET | Freq: Every day | ORAL | Status: DC
  Administered 2019-02-01 – 2019-02-03 (×3): 5 mg via ORAL
  Filled 2019-02-01 (×4): qty 1

## 2019-02-01 MED ORDER — NITROGLYCERIN 2 % TD OINT
1.0000 [in_us] | TOPICAL_OINTMENT | Freq: Four times a day (QID) | TRANSDERMAL | Status: DC
  Administered 2019-02-01 (×2): 1 [in_us] via TOPICAL
  Filled 2019-02-01: qty 30

## 2019-02-01 NOTE — Progress Notes (Signed)
[place] Progress Note Patient Name: Sonya Small DOB: 19-May-1951 MRN: [id number]   Date of Service  02/01/2019  HPI/Events of Note  Patient c/o chronic back pain - History of Percocet use at home. However, AST is elevated, therefore, will avoid Percocet.   eICU Interventions  Will order: 1. Ultram 50 mg PO Q 6 hours PRN pain.      Intervention Category Major Interventions: Other:  Leander Tout Cornelia Copa 02/01/2019, 8:04 PM

## 2019-02-01 NOTE — Progress Notes (Signed)
Wasted 240 mL Fentanyl in waste container with Marcha Solders, RN

## 2019-02-01 NOTE — Procedures (Signed)
Extubation Procedure Note  Patient Details:   Name: Sonya Small DOB: 1950-12-02 MRN: [id number]   Airway Documentation:    Vent end date: 02/01/19 Vent end time: 1100   Evaluation  O2 sats: stable throughout Complications: No apparent complications Patient did tolerate procedure well. Bilateral Breath Sounds: Diminished   Yes.  Pt extubated per physician order. Pt suctioned prior to extubation with no secretions via ETT/orally. Pt extubated to 3L nasal cannula. Pt with good, strong cough, able to speak name and no stridor was heard. RT will continue to monitor.   [name] [name] 02/01/2019, 11:06 AM

## 2019-02-01 NOTE — Progress Notes (Signed)
PULMONARY / CRITICAL CARE MEDICINE   NAME:  Sonya Small, MRN:  [id number], DOB:  1951/08/24, LOS: 1 ADMISSION DATE:  01/31/2019, CONSULTATION DATE:  01/31/2019 REFERRING UT:[id number] ED  , CHIEF COMPLAINT:  Acute on Chronic Respiratory Failure with hypoxia / New Right infiltrate/ AMS  BRIEF HISTORY:    68 year old female, current every day smoker, COPD with multiple co morbidities,   told her family she felt short of breath this morning and suddenly collapsed.  There is limited data from the family and the patient was unresponsive on arrival to [facility].  At the time she was being bagged with a BVM and  patient had a Glasgow Coma Scale of 3. She required emergent intubation in the ED. She was given  2 mg of Narcan in the field with no response. Urine drug screen was negative,  patient was mottled on arrival to [facility] ED. Imaging revealed pneumonia. Pt, was stabilized and transferred to [facility] for further care. PCCM were asked to admit and manage patient upon arrival.  HISTORY OF PRESENT ILLNESS   68 year old female, current every day smoker,  told her family she felt short of breath this morning and suddenly collapsed.  There is limited data from the family and the patient was unresponsive on arrival to [facility].  At the time she was being bagged with a BVM and  patient had a Glasgow Coma Scale of 3. She required emergent intubation. She was given  2 mg of Narcan in the field with no response.  Urine drug screen was negative patient was mottled on arrival to [facility] ED. Lactate was 10.9, WBC was 21.3. CT chest revealed airspace opacities in the dependent portion of the lobes of the lungs in the right peribronchovascular region appearance favors multifocal pneumonia versus atelectasis, possible component of interstitial edema at the bases.  Additionally there was notation of small bilateral pleural effusions and mild cardiomegaly.  There is a right adrenal mass that is stable since 2014 and  considered benign.  Left IJ triple-lumen was inserted, patient was treated with 2 L normal saline and received a dose of Rocephin 1 gram. She was transferred to [facility] for further management and care.PCCM were asked to admit and manage patient care.   SIGNIFICANT EVENTS:  01/31/2019>> Admission [place]  STUDIES:   01/31/2019 CT Chest [place] Airspace opacities in the dependent portion of the lobes of the lungs in the right peribronchovascular region appearance favors multifocal pneumonia versus atelectasis, possible component of interstitial edema at the bases.  Additionally there was notation of small bilateral pleural effusions and mild cardiomegaly.  There is a right adrenal mass that is stable since 2014 and considered benign. 01/31/2019 CXR  Apparent improvement in RIGHT lung airspace disease. Otherwise unchanged appearance of the chest.  CT head 01/31/2019 No acute intracranial findings White matter disease stable, suggestive of small vessel ischemia Chronic left maxillary sinus inflammation  CULTURES:  Respiratory 5/9 >>  Blood 5/9 >>  SARS Co2 5/9 >> negative  ANTIBIOTICS:  01/31/2019 Rocephin x 1 [place] 01/31/2019 Rocephin>> 01/31/2019 Azithro>>  LINES/TUBES:  01/31/2019 ETT>> 01/31/2019: L IJ triple lumen  CONSULTANTS:  PCCM  SUBJECTIVE:  Awake, interacting and writing notes Tolerating PSV 10 well No pressors  CONSTITUTIONAL: BP (!) 184/77   Pulse (!) 101   Temp 99.3 F (37.4 C) (Axillary)   Resp 15   Ht 5\' 5"  (1.651 m)   Wt 84.3 kg   SpO2 97%  BMI 30.93 kg/m   I/O last 3 completed shifts: In: 2119.2 [I.V.:1138.7; NG/GT:605.3; IV Piggyback:375.1] Out: 720 [Urine:720]     Vent Mode: CPAP;PSV FiO2 (%):  [40 %-70 %] 40 % Set Rate:  [24 bmp] 24 bmp Vt Set:  [450 mL-500 mL] 450 mL PEEP:  [5 cmH20] 5 cmH20 Pressure Support:  [10 cmH20] 10 cmH20 Plateau Pressure:  [17 cmH20-25 cmH20] 19 cmH20  PHYSICAL EXAM: General: obese woman, NAD on  MV Neuro: wide awake and appropriate on fent 50. Writing notes, following commands, strong cough HEENT:  ETT and OGT in position, large neck Cardiovascular:  Regular, no M Lungs:  Comfortable resp pattern, distant on exp but no wheezes.  Abdomen: soft, non-distended, + BS Musculoskeletal:  No deformities Skin:  No rash  RESOLVED PROBLEM LIST  Lactic acidosis   ASSESSMENT AND PLAN   Acute on Chronic Respiratory Failure with Hypoxemia  CAP RLL; COVID negative COPD on home O2 at 2 L Current every day smoker Plan Tolerating PSV well, meets criteria for extubation as long as she has a cuff leak.  VAP prevention orders Follow intermittent CXR azithro + ceftriaxone as ordered Was apparently on Tudorza, had Breo but used prn?? Also duoneb. Continue the duoneb scheduled for now, clarify her home regimen./   Severe sepsis, lactate cleared Decrease IVF on 5/10  Hypertension Restart home regimen as BP dictates once extubated  Acute renal insufficiency (S Cr 0.57 04/2018) -adequate UOP Decrease IVF as above Follow UOP and S Cr Insure renal perfusion Replace electrolytes as indicated.   Elevated LDH>> 845 >> 159 Plan Trend LDH for clearance  GI Plan Start diet post-extubation as tolerated.   DM BMI 30.96 Plan SSI per protocol Restart home DM regimen once stabilized from acute process  Acute multifactorial encephalopathy, improved.  -CT Head negative for Acute intracranial findings Depression Plan Minimize sedating meds Restart home meds lyrica, zoloft when able to take PO  Family ( Sister)  updated by RN 01/31/2019   SUMMARY OF TODAY'S PLAN:  Extubate today   Best Practice / Goals of Care / Disposition.   DVT PROPHYLAXIS:Lovenox WUJ:[id number] NUTRITION:NPO MOBILITY:BR GOALS OF CARE:Full Code FAMILY DISCUSSIONS: Sister updated by RN on 5/9 DISPOSITION ICU  LABS  Glucose Recent Labs  Lab 01/31/19 1324 01/31/19 1516 01/31/19 1928 01/31/19 2348 02/01/19 0358  02/01/19 0724  GLUCAP 213* 159* 152* 148* 191* 186*    BMET Recent Labs  Lab 01/31/19 1516 01/31/19 1547 02/01/19 0437  NA 144 143 144  K 4.5 4.5 3.9  CL  --  112* 112*  CO2  --  22 22  BUN  --  15 28*  CREATININE  --  1.14* 1.15*  GLUCOSE  --  164* 202*    Liver Enzymes Recent Labs  Lab 01/31/19 1547  AST 51*  ALT 36  ALKPHOS 65  BILITOT 0.5  ALBUMIN 3.1*    Electrolytes Recent Labs  Lab 01/31/19 1547 02/01/19 0437  CALCIUM 8.0* 8.4*  MG 1.8 1.7  PHOS 4.1  --     CBC Recent Labs  Lab 01/31/19 1516 01/31/19 1547 02/01/19 0437  WBC  --  14.7* 13.1*  HGB 14.3 14.5 12.2  HCT 42.0 46.9* 39.1  PLT  --  247 177    ABG Recent Labs  Lab 01/31/19 1516 02/01/19 0415  PHART 7.374 7.400  PCO2ART 36.0 37.7  PO2ART 110.0* 97.1    Coag's Recent Labs  Lab 01/31/19 1547  INR 1.0    Sepsis Markers  Recent Labs  Lab 01/31/19 1547 01/31/19 1959 02/01/19 0437  LATICACIDVEN 2.1* 1.6  --   PROCALCITON  --   --  3.06    Independent CC time 35 minutes   Baltazar Apo, MD, PhD 02/01/2019, 10:38 AM [facility] [phone] or if no answer [phone]

## 2019-02-02 ENCOUNTER — Other Ambulatory Visit: Payer: Self-pay

## 2019-02-02 ENCOUNTER — Encounter (HOSPITAL_COMMUNITY): Payer: Self-pay

## 2019-02-02 LAB — GLUCOSE, CAPILLARY
Glucose-Capillary: 139 mg/dL — ABNORMAL HIGH (ref 70–99)
Glucose-Capillary: 150 mg/dL — ABNORMAL HIGH (ref 70–99)
Glucose-Capillary: 154 mg/dL — ABNORMAL HIGH (ref 70–99)
Glucose-Capillary: 127 mg/dL — ABNORMAL HIGH (ref 70–99)
Glucose-Capillary: 148 mg/dL — ABNORMAL HIGH (ref 70–99)
Glucose-Capillary: 129 mg/dL — ABNORMAL HIGH (ref 70–99)

## 2019-02-02 LAB — COMPREHENSIVE METABOLIC PANEL
ALT: 22 U/L (ref 0–44)
AST: 22 U/L (ref 15–41)
Albumin: 2.8 g/dL — ABNORMAL LOW (ref 3.5–5.0)
Alkaline Phosphatase: 49 U/L (ref 38–126)
Anion gap: 8 (ref 5–15)
BUN: 23 mg/dL (ref 8–23)
CO2: 27 mmol/L (ref 22–32)
Calcium: 8.7 mg/dL — ABNORMAL LOW (ref 8.9–10.3)
Chloride: 109 mmol/L (ref 98–111)
Creatinine, Ser: 0.84 mg/dL (ref 0.44–1.00)
GFR calc Af Amer: >60 mL/min (ref >60)
GFR calc non Af Amer: >60 mL/min (ref >60)
Glucose, Bld: 152 mg/dL — ABNORMAL HIGH (ref 70–99)
Potassium: 3.9 mmol/L (ref 3.5–5.1)
Sodium: 144 mmol/L (ref 135–145)
Total Bilirubin: 0.6 mg/dL (ref 0.3–1.2)
Total Protein: 5.7 g/dL — ABNORMAL LOW (ref 6.5–8.1)

## 2019-02-02 LAB — LEGIONELLA PNEUMOPHILA SEROGP 1 UR AG: L. pneumophila Serogp 1 Ur Ag: NEGATIVE

## 2019-02-02 LAB — CBC
HCT: 38.8 % (ref 36.0–46.0)
Hemoglobin: 12.1 g/dL (ref 12.0–15.0)
MCH: 28.4 pg (ref 26.0–34.0)
MCHC: 31.2 g/dL (ref 30.0–36.0)
MCV: 91.1 fL (ref 80.0–100.0)
Platelets: 182 10*3/uL (ref 150–400)
RBC: 4.26 MIL/uL (ref 3.87–5.11)
RDW: 14.1 % (ref 11.5–15.5)
WBC: 13 10*3/uL — ABNORMAL HIGH (ref 4.0–10.5)
nRBC: 0 % (ref 0.0–0.2)

## 2019-02-02 LAB — MAGNESIUM: Magnesium: 1.7 mg/dL (ref 1.7–2.4)

## 2019-02-02 MED ORDER — NICOTINE 14 MG/24HR TD PT24
14.0000 mg | MEDICATED_PATCH | Freq: Every day | TRANSDERMAL | Status: DC
  Administered 2019-02-02 – 2019-02-06 (×5): 14 mg via TRANSDERMAL
  Filled 2019-02-02 (×5): qty 1

## 2019-02-02 MED ORDER — PANTOPRAZOLE SODIUM 40 MG PO TBEC
40.0000 mg | DELAYED_RELEASE_TABLET | Freq: Every day | ORAL | Status: DC
  Administered 2019-02-02 – 2019-02-06 (×5): 40 mg via ORAL
  Filled 2019-02-02 (×6): qty 1

## 2019-02-02 MED ORDER — INSULIN ASPART 100 UNIT/ML ~~LOC~~ SOLN
0.0000 [IU] | Freq: Every day | SUBCUTANEOUS | Status: DC
  Administered 2019-02-03 – 2019-02-05 (×2): 2 [IU] via SUBCUTANEOUS

## 2019-02-02 MED ORDER — INSULIN ASPART 100 UNIT/ML ~~LOC~~ SOLN
0.0000 [IU] | Freq: Three times a day (TID) | SUBCUTANEOUS | Status: DC
  Administered 2019-02-02 – 2019-02-03 (×4): 2 [IU] via SUBCUTANEOUS
  Administered 2019-02-03 – 2019-02-04 (×2): 3 [IU] via SUBCUTANEOUS
  Administered 2019-02-04: 2 [IU] via SUBCUTANEOUS
  Administered 2019-02-04: 8 [IU] via SUBCUTANEOUS
  Administered 2019-02-05: 3 [IU] via SUBCUTANEOUS
  Administered 2019-02-05: 5 [IU] via SUBCUTANEOUS
  Administered 2019-02-05: 2 [IU] via SUBCUTANEOUS
  Administered 2019-02-06 (×2): 5 [IU] via SUBCUTANEOUS

## 2019-02-02 MED ORDER — MIRABEGRON ER 25 MG PO TB24
50.0000 mg | ORAL_TABLET | Freq: Every day | ORAL | Status: DC
  Administered 2019-02-02 – 2019-02-06 (×5): 50 mg via ORAL
  Filled 2019-02-02: qty 1
  Filled 2019-02-02 (×4): qty 2

## 2019-02-02 MED ORDER — PREGABALIN 50 MG PO CAPS
50.0000 mg | ORAL_CAPSULE | Freq: Two times a day (BID) | ORAL | Status: DC
  Administered 2019-02-02 – 2019-02-06 (×9): 50 mg via ORAL
  Filled 2019-02-02 (×9): qty 1

## 2019-02-02 MED ORDER — IPRATROPIUM-ALBUTEROL 0.5-2.5 (3) MG/3ML IN SOLN: 3.0000 mL | Freq: Three times a day (TID) | RESPIRATORY_TRACT | Status: DC

## 2019-02-02 MED ORDER — FLUTICASONE FUROATE-VILANTEROL 200-25 MCG/INH IN AEPB
1.0000 | INHALATION_SPRAY | Freq: Every day | RESPIRATORY_TRACT | Status: DC
  Administered 2019-02-03 – 2019-02-06 (×4): 1 via RESPIRATORY_TRACT
  Filled 2019-02-02: qty 28

## 2019-02-02 MED ORDER — DARIFENACIN HYDROBROMIDE ER 7.5 MG PO TB24
15.0000 mg | ORAL_TABLET | Freq: Every day | ORAL | Status: DC
  Administered 2019-02-02 – 2019-02-06 (×5): 15 mg via ORAL
  Filled 2019-02-02 (×4): qty 2
  Filled 2019-02-02: qty 1

## 2019-02-02 MED ORDER — MONTELUKAST SODIUM 10 MG PO TABS
10.0000 mg | ORAL_TABLET | Freq: Every day | ORAL | Status: DC
  Administered 2019-02-02 – 2019-02-05 (×4): 10 mg via ORAL
  Filled 2019-02-02 (×5): qty 1

## 2019-02-02 MED ORDER — LEVOFLOXACIN 500 MG PO TABS
500.0000 mg | ORAL_TABLET | ORAL | Status: DC
  Administered 2019-02-02 – 2019-02-05 (×4): 500 mg via ORAL
  Filled 2019-02-02 (×5): qty 1

## 2019-02-02 MED ORDER — FLUTICASONE PROPIONATE 50 MCG/ACT NA SUSP
1.0000 | Freq: Every day | NASAL | Status: DC
  Administered 2019-02-02 – 2019-02-06 (×5): 1 via NASAL
  Filled 2019-02-02: qty 16

## 2019-02-02 MED ORDER — ROSUVASTATIN CALCIUM 20 MG PO TABS
20.0000 mg | ORAL_TABLET | Freq: Every day | ORAL | Status: DC
  Administered 2019-02-02 – 2019-02-06 (×5): 20 mg via ORAL
  Filled 2019-02-02 (×5): qty 1

## 2019-02-02 MED ORDER — IPRATROPIUM-ALBUTEROL 0.5-2.5 (3) MG/3ML IN SOLN: 3.0000 mL | Freq: Four times a day (QID) | RESPIRATORY_TRACT | Status: DC | PRN

## 2019-02-02 MED ORDER — CLOPIDOGREL BISULFATE 75 MG PO TABS
75.0000 mg | ORAL_TABLET | Freq: Every day | ORAL | Status: DC
  Administered 2019-02-02 – 2019-02-06 (×5): 75 mg via ORAL
  Filled 2019-02-02 (×5): qty 1

## 2019-02-02 MED ORDER — FENOFIBRATE 160 MG PO TABS
160.0000 mg | ORAL_TABLET | Freq: Every day | ORAL | Status: DC
  Administered 2019-02-02 – 2019-02-06 (×5): 160 mg via ORAL
  Filled 2019-02-02 (×5): qty 1

## 2019-02-02 MED ORDER — SERTRALINE HCL 100 MG PO TABS
200.0000 mg | ORAL_TABLET | Freq: Every day | ORAL | Status: DC
  Administered 2019-02-02 – 2019-02-06 (×5): 200 mg via ORAL
  Filled 2019-02-02 (×5): qty 2

## 2019-02-02 MED ORDER — FUROSEMIDE 20 MG PO TABS
20.0000 mg | ORAL_TABLET | Freq: Two times a day (BID) | ORAL | Status: DC
  Administered 2019-02-02 – 2019-02-06 (×9): 20 mg via ORAL
  Filled 2019-02-02 (×9): qty 1

## 2019-02-02 MED ORDER — ASPIRIN EC 81 MG PO TBEC
81.0000 mg | DELAYED_RELEASE_TABLET | Freq: Every day | ORAL | Status: DC
  Administered 2019-02-02 – 2019-02-06 (×5): 81 mg via ORAL
  Filled 2019-02-02 (×5): qty 1

## 2019-02-02 NOTE — Progress Notes (Signed)
Nutrition Consult -- Brief Note  Received MD Consult for TF initiation and management. Patient was extubated on 5/10. She is not on TF. Current diet is clear liquids. Chart screened. No nutrition interventions indicated at this time. Please re-consult if nutrition concerns arise.   Molli Barrows, RD, LDN, [place] Pager [phone] After Hours Pager [phone]

## 2019-02-02 NOTE — Progress Notes (Addendum)
Dr. Lynetta Mare rounding MD paged to see if iv fluids could be modified to prn d/t pt on strict I/o and currently on lasix.

## 2019-02-02 NOTE — Progress Notes (Signed)
Report received from nurse. Inquired about pt blood sugar and insulin. Nurse states she will give before transferring.

## 2019-02-02 NOTE — Progress Notes (Signed)
Dr. Lamonte Sakai office paged for modification.

## 2019-02-02 NOTE — Evaluation (Signed)
Occupational Therapy Evaluation Patient Details Name: Sonya Small MRN: [id number] DOB: 1950-10-05 Today's Date: 02/02/2019    History of Present Illness Pt taken to [facility] after SOB and collapse at home. Pt found to have PNA and intubated. Transferred to [facility]. Covid negative. Extubated 5/10. PMH - copd, active smoker, CAD, MI, CVA, PAD, HTN,    Clinical Impression   Pt typically functions independently with occasional use of a cane. She presents with mild unsteadiness requiring hand hand assist for ambulation in unit and set up to min guard assist for ADL. Pt likely to progress well. Will follow acutely.    Follow Up Recommendations  No OT follow up    Equipment Recommendations  None recommended by OT    Recommendations for Other Services       Precautions / Restrictions Precautions Precautions: Fall Precaution Comments: mild unsteadiness Restrictions Weight Bearing Restrictions: No      Mobility Bed Mobility Overal bed mobility: Modified Independent                Transfers Overall transfer level: Needs assistance Equipment used: None Transfers: Sit to/from Stand Sit to Stand: Min guard              Balance Overall balance assessment: Mild deficits observed, not formally tested                                         ADL either performed or assessed with clinical judgement   ADL Overall ADL's : Needs assistance/impaired Eating/Feeding: Independent;Sitting   Grooming: Brushing hair;Standing;Supervision/safety   Upper Body Bathing: Set up;Sitting   Lower Body Bathing: Sit to/from stand;Min guard   Upper Body Dressing : Set up;Sitting   Lower Body Dressing: Min guard;Sit to/from stand Lower Body Dressing Details (indicate cue type and reason): set up for socks using figure 4 technique Toilet Transfer: Minimal assistance;Ambulation   Toileting- Clothing Manipulation and Hygiene: Min guard;Sit to/from stand        Functional mobility during ADLs: Minimal assistance(hand held)       Vision Patient Visual Report: No change from baseline       Perception     Praxis      Pertinent Vitals/Pain Pain Assessment: Faces Faces Pain Scale: Hurts little more Pain Location: back Pain Descriptors / Indicators: Grimacing Pain Intervention(s): Monitored during session     Hand Dominance Right   Extremity/Trunk Assessment Upper Extremity Assessment Upper Extremity Assessment: Overall WFL for tasks assessed   Lower Extremity Assessment Lower Extremity Assessment: Defer to PT evaluation   Cervical / Trunk Assessment Cervical / Trunk Assessment: Normal   Communication Communication Communication: No difficulties   Cognition Arousal/Alertness: Awake/alert Behavior During Therapy: WFL for tasks assessed/performed Overall Cognitive Status: Within Functional Limits for tasks assessed                                     General Comments       Exercises     Shoulder Instructions      Home Living Family/patient expects to be discharged to:: Private residence Living Arrangements: Other relatives(sister) Available Help at Discharge: Family;Available PRN/intermittently Type of Home: House Home Access: Level entry     Home Layout: Two level;Bed/bath upstairs Alternate Level Stairs-Number of Steps: 9 Alternate Level Stairs-Rails: None Bathroom Shower/Tub: Tub/shower  unit   Bathroom Toilet: Standard     Home Equipment: Cane - single point;Shower seat;Other (comment)(02)          Prior Functioning/Environment Level of Independence: Independent with assistive device(s)        Comments: occasionally uses a cane        OT Problem List: Cardiopulmonary status limiting activity;Pain;Decreased activity tolerance;Impaired balance (sitting and/or standing)      OT Treatment/Interventions: Self-care/ADL training;Energy conservation;Patient/family education;Balance  training    OT Goals(Current goals can be found in the care plan section) Acute Rehab OT Goals Patient Stated Goal: return to PLOF OT Goal Formulation: With patient Time For Goal Achievement: 02/16/19 Potential to Achieve Goals: Good ADL Goals Pt Will Perform Grooming: Independently;standing Pt Will Perform Lower Body Bathing: Independently;sit to/from stand Pt Will Perform Lower Body Dressing: Independently;sit to/from stand Pt Will Transfer to Toilet: Independently;ambulating;regular height toilet Pt Will Perform Toileting - Clothing Manipulation and hygiene: Independently;sit to/from stand Pt Will Perform Tub/Shower Transfer: with modified independence;ambulating;shower seat;Tub transfer Additional ADL Goal #1: Pt will employ energy conservation strategies duirng ADL and mobility independently.  OT Frequency: Min 2X/week   Barriers to D/C:            Co-evaluation              AM-PAC OT "6 Clicks" Daily Activity     Outcome Measure Help from another person eating meals?: None Help from another person taking care of personal grooming?: A Little Help from another person toileting, which includes using toliet, bedpan, or urinal?: A Little Help from another person bathing (including washing, rinsing, drying)?: A Little Help from another person to put on and taking off regular upper body clothing?: None Help from another person to put on and taking off regular lower body clothing?: A Little 6 Click Score: 20   End of Session Equipment Utilized During Treatment: Oxygen(2L) Nurse Communication: Mobility status  Activity Tolerance: Patient tolerated treatment well Patient left: in chair;with call bell/phone within reach  OT Visit Diagnosis: Other abnormalities of gait and mobility (R26.89);Unsteadiness on feet (R26.81);Other (comment)(decreased activity tolerance)                Time: 1610-9604 OT Time Calculation (min): 21 min Charges:  OT General Charges $OT Visit: 1  Visit OT Evaluation $OT Eval Low Complexity: [place], OTR/L Acute Rehabilitation Services Pager: [phone] Office: [phone]  Malka So 02/02/2019, 2:00 PM

## 2019-02-02 NOTE — Progress Notes (Signed)
eLink Physician-Brief Progress Note Patient Name: RHAELYN GIRON DOB: 1950/09/27 MRN: [id number]   Date of Service  02/02/2019  HPI/Events of Note  Request for diet - Tolerating liquids well. History of DM.  eICU Interventions  Will order Thin carb modified diet.     Intervention Category Major Interventions: Other:  Lysle Dingwall 02/02/2019, 8:15 PM

## 2019-02-02 NOTE — Progress Notes (Signed)
PULMONARY / CRITICAL CARE MEDICINE   NAME:  Sonya Small, MRN:  [id number], DOB:  02-Apr-1951, LOS: 2 ADMISSION DATE:  01/31/2019, CONSULTATION DATE:  01/31/2019 REFERRING [facility]J:[id number] ED  , CHIEF COMPLAINT:  Acute on Chronic Respiratory Failure with hypoxia / New Right infiltrate/ AMS  BRIEF HISTORY:    68 year old female, current every day smoker, COPD with multiple co morbidities,   told her family she felt short of breath this morning and suddenly collapsed.  There is limited data from the family and the patient was unresponsive on arrival to [facility].  At the time she was being bagged with a BVM and  patient had a Glasgow Coma Scale of 3. She required emergent intubation in the ED. She was given  2 mg of Narcan in the field with no response. Urine drug screen was negative,  patient was mottled on arrival to [facility] ED. Imaging revealed pneumonia. Pt, was stabilized and transferred to [facility] for further care. PCCM were asked to admit and manage patient upon arrival.  HISTORY OF PRESENT ILLNESS   68 year old female, current every day smoker,  told her family she felt short of breath this morning and suddenly collapsed.  There is limited data from the family and the patient was unresponsive on arrival to [facility].  At the time she was being bagged with a BVM and  patient had a Glasgow Coma Scale of 3. She required emergent intubation. She was given  2 mg of Narcan in the field with no response.  Urine drug screen was negative patient was mottled on arrival to [facility] ED. Lactate was 10.9, WBC was 21.3. CT chest revealed airspace opacities in the dependent portion of the lobes of the lungs in the right peribronchovascular region appearance favors multifocal pneumonia versus atelectasis, possible component of interstitial edema at the bases.  Additionally there was notation of small bilateral pleural effusions and mild cardiomegaly.  There is a right adrenal mass that is stable since 2014 and  considered benign.  Left IJ triple-lumen was inserted, patient was treated with 2 L normal saline and received a dose of Rocephin 1 gram. She was transferred to [facility] for further management and care.PCCM were asked to admit and manage patient care.   SIGNIFICANT EVENTS:  01/31/2019>> Admission [place]  STUDIES:   01/31/2019 CT Chest [place] Airspace opacities in the dependent portion of the lobes of the lungs in the right peribronchovascular region appearance favors multifocal pneumonia versus atelectasis, possible component of interstitial edema at the bases.  Additionally there was notation of small bilateral pleural effusions and mild cardiomegaly.  There is a right adrenal mass that is stable since 2014 and considered benign. 01/31/2019 CXR  Apparent improvement in RIGHT lung airspace disease. Otherwise unchanged appearance of the chest.  CT head 01/31/2019 No acute intracranial findings White matter disease stable, suggestive of small vessel ischemia Chronic left maxillary sinus inflammation  CULTURES:  Respiratory 5/9 >>  Blood 5/9 >>  SARS Co2 5/9 >> negative  ANTIBIOTICS:  01/31/2019 Rocephin x 1 [place] 01/31/2019 Rocephin>> 01/31/2019 [place]ro>>  LINES/TUBES:  01/31/2019 ETT>> 5/10 01/31/2019: L IJ triple lumen  CONSULTANTS:  PCCM  SUBJECTIVE:  Extubated successfully Tolerating some clear diet Has not gotten out of bed yet, Foley still in place  CONSTITUTIONAL: BP 136/83 (BP Location: Right Arm)   Pulse 89   Temp 97.8 F (36.6 C) (Oral)   Resp 18   Ht 5\' 5"  (1.651 m)  Wt 84.4 kg   SpO2 96%   BMI 30.96 kg/m   I/O last 3 completed shifts: In: 3130.9 [P.O.:600; I.V.:1320.5; Other:50; NG/GT:764.7; IV Piggyback:395.7] Out: 3790 [Urine:3145]     FiO2 (%):  [40 %] 40 %  PHYSICAL EXAM: General: Obese woman, comfortable in bed, interacting Neuro: Awake, appropriate, follows commands, answers all questions, moves all extremities, well oriented HEENT:  Oropharynx clear, mild expiratory upper airway noise Cardiovascular: Regular, no murmur Lungs: Comfortable respiratory pattern, distant on expiration, a few scattered end expiratory wheezes with some referred upper airway noise Abdomen: Soft, nondistended, positive bowel sounds Musculoskeletal: No deformities Skin: No rash  RESOLVED PROBLEM LIST  Lactic acidosis  Acute respiratory failure Severe sepsis Elevated LDH  ASSESSMENT AND PLAN   Acute on Chronic Respiratory Failure with Hypoxemia  CAP RLL; COVID negative COPD on home O2 at 2 L Current every day smoker Plan Continue to push pulmonary hygiene Consider convert azithromycin, ceftriaxone to levofloxacin to complete a 7-day course Reviewed her bronchodilator regimen with her today.  States that she is supposed to be on Breo, only takes occasionally.  She also has Advair available.  She does not recognize [name] (which may also on her medicine list) For now I think it reasonable to restart Breo, determine what LAMA  her insurance coverage prefers Change DuoNeb to as needed  Hypertension Increased blood pressure since extubated 5/10 Restarted lisinopril, metoprolol 5/10 Start home Lasix 5/11 and follow BMP  Acute renal insufficiency (S Cr 0.57 04/2018) -adequate UOP Decrease IVF as above Follow UOP and S Cr Insure renal perfusion Replace electrolytes as indicated.   GI Plan Start diet post-extubation as tolerated.  Restart her home PPI (pantoprazole substituted for Nexium)  History of CAD, stroke Plan Restart ASA, Plavix, fenofibrate, rosuvastatin  DM BMI 30.96 Plan Sliding-scale insulin as per protocol Restart Janumet, Farxiga once she is taking a good p.o. diet  Allergic rhinitis Plan Fluticasone nasal spray, Singulair  Urinary retention Plan Restart Myrbetriq, vesicare (substituted  Acute multifactorial encephalopathy, improved.  -CT Head negative for Acute intracranial findings Depression Chronic  pain Plan Minimize sedating meds Restart Zoloft, Lyrica Continue tramadol for now, try to defer narcotics, relaxers, Xanax if possible  Family ( Sister)  updated by RN 01/31/2019   SUMMARY OF TODAY'S PLAN:  Extubate today   Best Practice / Goals of Care / Disposition.   DVT PROPHYLAXIS:Lovenox WIO:XBDZHG NUTRITION:NPO MOBILITY:BR GOALS OF CARE:Full Code FAMILY DISCUSSIONS: Sister updated by RN on 5/9 DISPOSITION ICU  LABS  Glucose Recent Labs  Lab 02/01/19 1121 02/01/19 1520 02/01/19 1932 02/01/19 2332 02/02/19 0357 02/02/19 0739  GLUCAP 161* 174* 196* 118* 139* 150*    BMET Recent Labs  Lab 01/31/19 1516 01/31/19 1547 02/01/19 0437  NA 144 143 144  K 4.5 4.5 3.9  CL  --  112* 112*  CO2  --  22 22  BUN  --  15 28*  CREATININE  --  1.14* 1.15*  GLUCOSE  --  164* 202*    Liver Enzymes Recent Labs  Lab 01/31/19 1547  AST 51*  ALT 36  ALKPHOS 65  BILITOT 0.5  ALBUMIN 3.1*    Electrolytes Recent Labs  Lab 01/31/19 1547 02/01/19 0437  CALCIUM 8.0* 8.4*  MG 1.8 1.7  PHOS 4.1  --     CBC Recent Labs  Lab 01/31/19 1547 02/01/19 0437 02/02/19 0757  WBC 14.7* 13.1* 13.0*  HGB 14.5 12.2 12.1  HCT 46.9* 39.1 38.8  PLT 247 177 182  ABG Recent Labs  Lab 01/31/19 1516 02/01/19 0415  PHART 7.374 7.400  PCO2ART 36.0 37.7  PO2ART 110.0* 97.1    Coag's Recent Labs  Lab 01/31/19 1547  INR 1.0    Sepsis Markers Recent Labs  Lab 01/31/19 1547 01/31/19 1959 02/01/19 0437  LATICACIDVEN 2.1* 1.6  --   PROCALCITON  --   --  3.06    Plan transition out of the ICU on 5/11.  Will ask TRH to assume her care as of 5/12.   Baltazar Apo, MD, PhD 02/02/2019, 9:25 AM [facility] [phone] or if no answer [phone]

## 2019-02-02 NOTE — Progress Notes (Signed)
Attempted to call report to 2w x2

## 2019-02-02 NOTE — Progress Notes (Signed)
1st attempt to call RN for report-No answer. 2nd attempt to call RN for report-RN request call back, stated on phone with doctor. This RN requested reporting RN to ask Dr. About pt's COVID screening d/t not seeing any results in lab work. RN states ok, let me do this first. RN made aware that may be a factor of placement on unit. RN stated that pt is negative.

## 2019-02-02 NOTE — Evaluation (Signed)
Physical Therapy Evaluation Patient Details Name: Sonya Small MRN: [id number] DOB: 10/27/1950 Today's Date: 02/02/2019   History of Present Illness  Pt taken to [facility] after SOB and collapse at home. Pt found to have PNA and intubated. Transferred to [facility]. Covid negative. Extubated 5/10. PMH - copd, active smoker, CAD, MI, CVA, PAD, HTN,   Clinical Impression  Pt doing relatively well with mobility. Expect will progress quickly and be able to return home with sister. Doubt will need PT after DC.   Follow Up Recommendations No PT follow up    Equipment Recommendations  None recommended by PT    Recommendations for Other Services       Precautions / Restrictions Precautions Precautions: Fall Precaution Comments: mild unsteadiness Restrictions Weight Bearing Restrictions: No      Mobility  Bed Mobility Overal bed mobility: Modified Independent                Transfers Overall transfer level: Needs assistance Equipment used: None Transfers: Sit to/from Stand Sit to Stand: Min guard         General transfer comment: Assist for balance  Ambulation/Gait Ambulation/Gait assistance: Min assist Gait Distance (Feet): 200 Feet Assistive device: 1 person hand held assist Gait Pattern/deviations: Step-through pattern;Decreased stride length;Drifts right/left Gait velocity: decr Gait velocity interpretation: 1.31 - 2.62 ft/sec, indicative of limited community ambulator General Gait Details: Slightly unsteady gait. Assist for balance. Pt amb on O2.  Stairs            Wheelchair Mobility    Modified Rankin (Stroke Patients Only)       Balance Overall balance assessment: Mild deficits observed, not formally tested                                           Pertinent Vitals/Pain Pain Assessment: Faces Faces Pain Scale: Hurts little more Pain Location: back Pain Descriptors / Indicators: Grimacing Pain Intervention(s):  Limited activity within patient's tolerance;Monitored during session    Home Living Family/patient expects to be discharged to:: Private residence Living Arrangements: Other relatives(sister) Available Help at Discharge: Family;Available PRN/intermittently Type of Home: House Home Access: Level entry     Home Layout: Two level;Bed/bath upstairs Home Equipment: Cane - single point;Shower seat;Other (comment)(02) Additional Comments: Pt on home O2    Prior Function Level of Independence: Independent with assistive device(s)         Comments: occasionally uses a cane     Hand Dominance   Dominant Hand: Right    Extremity/Trunk Assessment   Upper Extremity Assessment Upper Extremity Assessment: Defer to OT evaluation    Lower Extremity Assessment Lower Extremity Assessment: Generalized weakness    Cervical / Trunk Assessment Cervical / Trunk Assessment: Normal  Communication   Communication: No difficulties  Cognition Arousal/Alertness: Awake/alert Behavior During Therapy: WFL for tasks assessed/performed Overall Cognitive Status: Within Functional Limits for tasks assessed                                        General Comments      Exercises     Assessment/Plan    PT Assessment Patient needs continued PT services  PT Problem List Decreased strength;Decreased balance;Decreased mobility;Decreased activity tolerance       PT Treatment Interventions DME instruction;Gait training;Stair  training;Functional mobility training;Therapeutic activities;Therapeutic exercise;Balance training;Patient/family education    PT Goals (Current goals can be found in the Care Plan section)  Acute Rehab PT Goals Patient Stated Goal: return to PLOF PT Goal Formulation: With patient Time For Goal Achievement: 02/09/19 Potential to Achieve Goals: Good    Frequency Min 3X/week   Barriers to discharge Inaccessible home environment 2 level home     Co-evaluation PT/OT/SLP Co-Evaluation/Treatment: Yes Reason for Co-Treatment: Other (comment)(expected complexity post extubationq)           AM-PAC PT "6 Clicks" Mobility  Outcome Measure Help needed turning from your back to your side while in a flat bed without using bedrails?: None Help needed moving from lying on your back to sitting on the side of a flat bed without using bedrails?: None Help needed moving to and from a bed to a chair (including a wheelchair)?: A Little Help needed standing up from a chair using your arms (e.g., wheelchair or bedside chair)?: A Little Help needed to walk in hospital room?: A Little Help needed climbing 3-5 steps with a railing? : A Little 6 Click Score: 20    End of Session Equipment Utilized During Treatment: Oxygen Activity Tolerance: Patient tolerated treatment well Patient left: in chair;with call bell/phone within reach;with chair alarm set   PT Visit Diagnosis: Unsteadiness on feet (R26.81);Muscle weakness (generalized) (M62.81)    Time: 7915-0569 PT Time Calculation (min) (ACUTE ONLY): 21 min   Charges:   PT Evaluation $PT Eval Low Complexity: [place] Pager [phone] Office [place] 02/02/2019, 4:45 PM

## 2019-02-02 NOTE — Progress Notes (Signed)
Pt confirmed negative Covid-19, approved to transfer to 2W.

## 2019-02-03 LAB — BASIC METABOLIC PANEL
Anion gap: 6 (ref 5–15)
BUN: 15 mg/dL (ref 8–23)
CO2: 31 mmol/L (ref 22–32)
Calcium: 8.7 mg/dL — ABNORMAL LOW (ref 8.9–10.3)
Chloride: 105 mmol/L (ref 98–111)
Creatinine, Ser: 0.83 mg/dL (ref 0.44–1.00)
GFR calc Af Amer: >60 mL/min (ref >60)
GFR calc non Af Amer: >60 mL/min (ref >60)
Glucose, Bld: 189 mg/dL — ABNORMAL HIGH (ref 70–99)
Potassium: 3.1 mmol/L — ABNORMAL LOW (ref 3.5–5.1)
Sodium: 142 mmol/L (ref 135–145)

## 2019-02-03 LAB — GLUCOSE, CAPILLARY
Glucose-Capillary: 139 mg/dL — ABNORMAL HIGH (ref 70–99)
Glucose-Capillary: 132 mg/dL — ABNORMAL HIGH (ref 70–99)
Glucose-Capillary: 177 mg/dL — ABNORMAL HIGH (ref 70–99)
Glucose-Capillary: 209 mg/dL — ABNORMAL HIGH (ref 70–99)

## 2019-02-03 LAB — CBC
HCT: 41.2 % (ref 36.0–46.0)
Hemoglobin: 13.1 g/dL (ref 12.0–15.0)
MCH: 28.5 pg (ref 26.0–34.0)
MCHC: 31.8 g/dL (ref 30.0–36.0)
MCV: 89.8 fL (ref 80.0–100.0)
Platelets: 214 10*3/uL (ref 150–400)
RBC: 4.59 MIL/uL (ref 3.87–5.11)
RDW: 13.9 % (ref 11.5–15.5)
WBC: 13.1 10*3/uL — ABNORMAL HIGH (ref 4.0–10.5)
nRBC: 0 % (ref 0.0–0.2)

## 2019-02-03 MED ORDER — LISINOPRIL 5 MG PO TABS
5.0000 mg | ORAL_TABLET | Freq: Once | ORAL | Status: AC
  Administered 2019-02-03: 5 mg via ORAL
  Filled 2019-02-03: qty 1

## 2019-02-03 MED ORDER — AMLODIPINE BESYLATE 5 MG PO TABS
5.0000 mg | ORAL_TABLET | Freq: Every day | ORAL | Status: DC
  Administered 2019-02-03 – 2019-02-06 (×4): 5 mg via ORAL
  Filled 2019-02-03 (×4): qty 1

## 2019-02-03 MED ORDER — POTASSIUM CHLORIDE CRYS ER 20 MEQ PO TBCR
40.0000 meq | EXTENDED_RELEASE_TABLET | ORAL | Status: AC
  Administered 2019-02-03 (×2): 40 meq via ORAL
  Filled 2019-02-03 (×2): qty 2

## 2019-02-03 MED ORDER — LISINOPRIL 10 MG PO TABS
10.0000 mg | ORAL_TABLET | Freq: Every day | ORAL | Status: DC
  Administered 2019-02-04 – 2019-02-05 (×2): 10 mg via ORAL
  Filled 2019-02-03 (×2): qty 1

## 2019-02-03 NOTE — Progress Notes (Signed)
Occupational Therapy Treatment Patient Details Name: Sonya Small MRN: [id number] DOB: 14-Apr-1951 Today's Date: 02/03/2019    History of present illness Pt taken to [facility] after SOB and collapse at home. Pt found to have PNA and intubated. Transferred to [facility]. Covid negative. Extubated 5/10. PMH - copd, active smoker, CAD, MI, CVA, PAD, HTN,    OT comments  Pt progressing towards established OT goals. However, pt was limited by nausea and elevated BP this session. RN reporting pt stable to mobilize. Pt performing grooming tasks while seated and functional mobility in hallway with Min Guard A. Pt with two episode of LOB but was able to self correct. SpO2 89-86% on RA; placed on 2L and pt maintaining 96%-94% during activity. Notified RN. Continue to recommend dc home once medically stable per physician.    Follow Up Recommendations  No OT follow up    Equipment Recommendations  None recommended by OT    Recommendations for Other Services      Precautions / Restrictions Precautions Precautions: Fall Precaution Comments: mild unsteadiness Restrictions Weight Bearing Restrictions: No       Mobility Bed Mobility Overal bed mobility: Modified Independent                Transfers Overall transfer level: Needs assistance Equipment used: None Transfers: Sit to/from Stand Sit to Stand: Min guard         General transfer comment: Min Guard A for safety    Balance Overall balance assessment: Mild deficits observed, not formally tested                                         ADL either performed or assessed with clinical judgement   ADL Overall ADL's : Needs assistance/impaired     Grooming: Brushing hair;Supervision/safety;Wash/dry face;Sitting Grooming Details (indicate cue type and reason): Pt feeling nauseous and performing grooming tasks while seated.                  Toilet Transfer: Ambulation;Min guard(simulated to  recliner)           Functional mobility during ADLs: Min guard General ADL Comments: Pt performing grooming while seated. Reporting nausea and BP elevated to 221/97. RN stating pt ok to mobilize and administered pt BP medication. Pt performing functional mobility in hallway with Min Guard A. Presenting with two episodes of LOB in which pt was able to self correct     Vision       Perception     Praxis      Cognition Arousal/Alertness: Awake/alert Behavior During Therapy: WFL for tasks assessed/performed Overall Cognitive Status: Within Functional Limits for tasks assessed                                          Exercises     Shoulder Instructions       General Comments BP 221/97. SpO2 86% on RA. Placed on 2L O2 and she maintained SpO2 96-94%    Pertinent Vitals/ Pain       Pain Assessment: Faces Faces Pain Scale: Hurts little more Pain Location: back Pain Descriptors / Indicators: Grimacing Pain Intervention(s): Monitored during session;Limited activity within patient's tolerance;Repositioned  Home Living  Prior Functioning/Environment              Frequency  Min 2X/week        Progress Toward Goals  OT Goals(current goals can now be found in the care plan section)  Progress towards OT goals: Progressing toward goals  Acute Rehab OT Goals Patient Stated Goal: return to PLOF OT Goal Formulation: With patient Time For Goal Achievement: 02/16/19 Potential to Achieve Goals: Good ADL Goals Pt Will Perform Grooming: Independently;standing Pt Will Perform Lower Body Bathing: Independently;sit to/from stand Pt Will Perform Lower Body Dressing: Independently;sit to/from stand Pt Will Transfer to Toilet: Independently;ambulating;regular height toilet Pt Will Perform Toileting - Clothing Manipulation and hygiene: Independently;sit to/from stand Pt Will Perform Tub/Shower Transfer:  with modified independence;ambulating;shower seat;Tub transfer Additional ADL Goal #1: Pt will employ energy conservation strategies duirng ADL and mobility independently.  Plan Discharge plan remains appropriate    Co-evaluation                 AM-PAC OT "6 Clicks" Daily Activity     Outcome Measure   Help from another person eating meals?: None Help from another person taking care of personal grooming?: A Little Help from another person toileting, which includes using toliet, bedpan, or urinal?: A Little Help from another person bathing (including washing, rinsing, drying)?: A Little Help from another person to put on and taking off regular upper body clothing?: None Help from another person to put on and taking off regular lower body clothing?: A Little 6 Click Score: 20    End of Session Equipment Utilized During Treatment: Oxygen(2L)  OT Visit Diagnosis: Other abnormalities of gait and mobility (R26.89);Unsteadiness on feet (R26.81);Other (comment)(decreased activity tolerance)   Activity Tolerance Patient tolerated treatment well   Patient Left in chair;with call bell/phone within reach   Nurse Communication Mobility status        Time: 0730-0806 OT Time Calculation (min): 36 min  Charges: OT General Charges $OT Visit: 1 Visit OT Treatments $Self Care/Home Management : 23-37 mins  [place], OTR/L Acute Rehab Pager: [phone] Office: [place] 02/03/2019, 8:11 AM

## 2019-02-03 NOTE — Progress Notes (Addendum)
PROGRESS NOTE    Sonya Small  DGU:[id number] DOB: 11/10/1950 DOA: 01/31/2019 PCP: [name], MD   Brief Narrative:  68 year old female, current every day smoker, COPD with multiple co morbidities,   told her family she felt short of breath this morning and suddenly collapsed.  There is limited data from the family and the patient was unresponsive on arrival to [facility].  At the time she was being bagged with Kynnedy Carreno BVM and  patient had Perry Brucato Glasgow Coma Scale of 3. She required emergent intubation in the ED. She was given  2 mg of Narcan in the field with no response. Urine drug screen was negative,  patient was mottled on arrival to [facility] ED. Imaging revealed pneumonia. Pt, was stabilized and transferred to [facility] for further care. PCCM were asked to admit and manage patient upon arrival.  Transferred to [facility] on 5/12.  Now extubated, being treated for pneumonia.     Assessment & Plan:   Active Problems:   Acute respiratory failure ([place])   Community acquired pneumonia   Pneumonia  Acute on Chronic Respiratory Failure with Hypoxemia  Pneumonia CAP RLL; COVID negative (confirmed in [place] records) COPD on home O2 at 2 L - have gotten conflicting reports, pt notes she has been on this at night only -> per sister, she doesn't use oxygen at home (she did have Kymber Kosar cpap, but she apparently gave this away) Current every day smoker CXR from 5/10 with right lower lobe pneumonia vs aspiration pneumonitis IS/flutter valve Ceftriaxone/azithromycin (5/9-5/10) -> transitioned to levoquin on 5/11 by PCCM - plan for 7 days abx Breo ellipta has been restarted Will look into cost of LAMA's to add prior to discharge Apparently per PCCM notes, "states that she is supposed to be on Breo, only takes occasionally.  She also has Advair available.  She does not recognize [name] (which may also on her medicine list)" Blood and urine cx from [place]. Cx here, NGTD.  Hypertension  Hypertensive Urgency  Increased blood pressure since extubated 5/10 Restarted lisinopril, metoprolol 5/10 Start home Lasix 5/11  Continues to have significantly elevated BP's today into the 956'L systolic Add amlodipine and increase lisinopril to 10 mg  Acute renal insufficiency (S Cr 0.57 04/2018) Creatinine peaked at 1.15 Improved   GI Start diet post-extubation as tolerated.  Restart her home PPI (pantoprazole substituted for Nexium)  History of CAD, stroke Restart ASA, Plavix, fenofibrate, rosuvastatin  DM BMI 30.96 Sliding-scale insulin as per protocol Janumet, Farxiga on hold  Allergic rhinitis Fluticasone nasal spray, Singulair  Urinary retention Restart Myrbetriq, vesicare (substituted  Acute multifactorial encephalopathy, improved.  -CT Head negative for Acute intracranial findings  Depression Chronic pain Minimize sedating meds Restart Zoloft, Lyrica Continue tramadol for now, try to defer narcotics, relaxers, Xanax if possible  Headache: chronic, continue to monitor, treat with apap as needed.   Hypokalemia: replace, follow  Stable R adrenal mass: on [place] imaging, considered benign, follow  DVT prophylaxis: SCD Code Status: full Family Communication: none at bedside - discussed  Disposition Plan: pending further improvement, pt with significantly elevated BP, weaning O2.  hopefully d/c within next 1-2 days  Consultants:   PCCM  Procedures:  01/31/2019 ETT>> 5/10 01/31/2019: L IJ triple lumen  Antimicrobials:  Anti-infectives (From admission, onward)   Start     Dose/Rate Route Frequency Ordered Stop   02/02/19 1700  levofloxacin (LEVAQUIN) tablet 500 mg     500 mg Oral Every 24 hours 02/02/19 0932  02/07/19 1659   02/01/19 1000  cefTRIAXone (ROCEPHIN) 2 g in sodium chloride 0.9 % 100 mL IVPB  Status:  Discontinued    Note to Pharmacy:  Received Shreena Baines dose in ED at [facility]   2 g 200 mL/hr over 30 Minutes Intravenous Every 24 hours 01/31/19 1522 02/02/19 0934    01/31/19 1530  azithromycin (ZITHROMAX) 500 mg in sodium chloride 0.9 % 250 mL IVPB  Status:  Discontinued     500 mg 250 mL/hr over 60 Minutes Intravenous Every 24 hours 01/31/19 1522 02/02/19 0934   01/31/19 1530  cefTRIAXone (ROCEPHIN) 1 g in sodium chloride 0.9 % 100 mL IVPB    Note to Pharmacy:  Today, only got 1 gram in ED at [facility]   1 g 200 mL/hr over 30 Minutes Intravenous  Once 01/31/19 1522 01/31/19 1704     Subjective: Feels poorly. Notes HA, she notes this is chronic for her.  Objective: Vitals:   02/03/19 0739 02/03/19 0835 02/03/19 1245 02/03/19 1604  BP: (!) 221/97  (!) 182/79 (!) 168/72  Pulse: 92  79 80  Resp: (!) 22   20  Temp: 97.7 F (36.5 C)   98.2 F (36.8 C)  TempSrc: Oral   Oral  SpO2: (!) 89% 96%  97%  Weight:      Height:        Intake/Output Summary (Last 24 hours) at 02/03/2019 1654 Last data filed at 02/02/2019 2142 Gross per 24 hour  Intake 240 ml  Output 700 ml  Net -460 ml   Filed Weights   01/31/19 1434 02/01/19 0424 02/02/19 0500  Weight: 84.4 kg 84.3 kg 84.4 kg    Examination:  General exam: Appears calm and comfortable  Respiratory system: Clear to auscultation. Respiratory effort normal. Cardiovascular system: S1 & S2 heard, RRR.  Gastrointestinal system: Abdomen is nondistended, soft and nontender. Central nervous system: Alert and oriented. No focal neurological deficits. Extremities: no LEE Skin: No rashes, lesions or ulcers Psychiatry: Judgement and insight appear normal. Mood & affect appropriate.     Data Reviewed: I have personally reviewed following labs and imaging studies  CBC: Recent Labs  Lab 01/31/19 1516 01/31/19 1547 02/01/19 0437 02/02/19 0757 02/03/19 1038  WBC  --  14.7* 13.1* 13.0* 13.1*  NEUTROABS  --  12.6*  --   --   --   HGB 14.3 14.5 12.2 12.1 13.1  HCT 42.0 46.9* 39.1 38.8 41.2  MCV  --  90.0 90.7 91.1 89.8  PLT  --  247 177 182 081   Basic Metabolic Panel: Recent Labs  Lab 01/31/19  1516 01/31/19 1547 02/01/19 0437 02/02/19 0757 02/03/19 1038  NA 144 143 144 144 142  K 4.5 4.5 3.9 3.9 3.1*  CL  --  112* 112* 109 105  CO2  --  22 22 27 31   GLUCOSE  --  164* 202* 152* 189*  BUN  --  15 28* 23 15  CREATININE  --  1.14* 1.15* 0.84 0.83  CALCIUM  --  8.0* 8.4* 8.7* 8.7*  MG  --  1.8 1.7 1.7  --   PHOS  --  4.1  --   --   --    GFR: Estimated Creatinine Clearance: 68.7 mL/min (by C-G formula based on SCr of 0.83 mg/dL). Liver Function Tests: Recent Labs  Lab 01/31/19 1547 02/02/19 0757  AST 51* 22  ALT 36 22  ALKPHOS 65 49  BILITOT 0.5 0.6  PROT 5.8* 5.7*  ALBUMIN 3.1* 2.8*   No results for input(s): LIPASE, AMYLASE in the last 168 hours. No results for input(s): AMMONIA in the last 168 hours. Coagulation Profile: Recent Labs  Lab 01/31/19 1547  INR 1.0   Cardiac Enzymes: No results for input(s): CKTOTAL, CKMB, CKMBINDEX, TROPONINI in the last 168 hours. BNP (last 3 results) No results for input(s): PROBNP in the last 8760 hours. HbA1C: Recent Labs    02/01/19 0437  HGBA1C 7.6*   CBG: Recent Labs  Lab 02/02/19 1631 02/02/19 2017 02/03/19 0733 02/03/19 1147 02/03/19 1559  GLUCAP 148* 129* 139* 132* 177*   Lipid Profile: No results for input(s): CHOL, HDL, LDLCALC, TRIG, CHOLHDL, LDLDIRECT in the last 72 hours. Thyroid Function Tests: No results for input(s): TSH, T4TOTAL, FREET4, T3FREE, THYROIDAB in the last 72 hours. Anemia Panel: No results for input(s): VITAMINB12, FOLATE, FERRITIN, TIBC, IRON, RETICCTPCT in the last 72 hours. Sepsis Labs: Recent Labs  Lab 01/31/19 1547 01/31/19 1959 02/01/19 0437  PROCALCITON  --   --  3.06  LATICACIDVEN 2.1* 1.6  --     Recent Results (from the past 240 hour(s))  MRSA PCR Screening     Status: None   Collection Time: 01/31/19  1:34 PM  Result Value Ref Range Status   MRSA by PCR NEGATIVE NEGATIVE Final    Comment:        The GeneXpert MRSA Assay (FDA approved for NASAL specimens  only), is one component of Destani Wamser comprehensive MRSA colonization surveillance program. It is not intended to diagnose MRSA infection nor to guide or monitor treatment for MRSA infections. Performed at [facility], [place] [address], [place], [place] [address]   Culture, blood (routine x 2)     Status: None (Preliminary result)   Collection Time: 01/31/19  4:00 PM  Result Value Ref Range Status   Specimen Description BLOOD LEFT ARM  Final   Special Requests   Final    BOTTLES DRAWN AEROBIC AND ANAEROBIC Blood Culture adequate volume   Culture   Final    NO GROWTH 3 DAYS Performed at [facility], [place] [address], [place], [place] [address]    Report Status PENDING  Incomplete  Culture, blood (routine x 2)     Status: None (Preliminary result)   Collection Time: 01/31/19  4:05 PM  Result Value Ref Range Status   Specimen Description BLOOD RIGHT ANTECUBITAL  Final   Special Requests AEROBIC BOTTLE ONLY Blood Culture adequate volume  Final   Culture   Final    NO GROWTH 3 DAYS Performed at [facility], [place] [address], [place], [place] [address]    Report Status PENDING  Incomplete         Radiology Studies: No results found.      Scheduled Meds: . amLODipine  5 mg Oral Daily  . aspirin EC  81 mg Oral Daily  . Chlorhexidine Gluconate Cloth  6 each Topical Q0600  . clopidogrel  75 mg Oral Daily  . darifenacin  15 mg Oral Daily  . fenofibrate  160 mg Oral Daily  . fluticasone  1 spray Each Nare Daily  . fluticasone furoate-vilanterol  1 puff Inhalation Daily  . furosemide  20 mg Oral BID  . insulin aspart  0-15 Units Subcutaneous TID WC  . insulin aspart  0-5 Units Subcutaneous QHS  . levofloxacin  500 mg Oral Q24H  . [START ON 02/04/2019] lisinopril  10 mg Oral Daily  . mouth  rinse  15 mL Mouth Rinse BID  . metoprolol tartrate  25 mg Oral Daily  . mirabegron ER  50 mg Oral Daily  . montelukast  10 mg Oral QHS  . nicotine  14 mg Transdermal Daily  .  pantoprazole  40 mg Oral Daily  . pregabalin  50 mg Oral BID  . rosuvastatin  20 mg Oral Daily  . sertraline  200 mg Oral Daily   Continuous Infusions:   LOS: 3 days    Time spent: over 30 min    Fayrene Helper, MD [facility] Pager AMION  If 7PM-7AM, please contact night-coverage [email or web address] Password [facility] 02/03/2019, 4:54 PM

## 2019-02-03 NOTE — TOC Initial Note (Signed)
Transition of Care [facility]) - Initial/Assessment Note    Patient Details  Name: Sonya Small MRN: [id number] Date of Birth: 26-Oct-1950  Transition of [facility] CM/SW Contact:    Zenon Mayo, RN Phone Number: 02/03/2019, 4:34 PM  Clinical Narrative:                 From home with sister, NCM offered choice, she chose [facility] referral given to [facility] for HHPT, soc will begin 24 to 48 hrs post dc  She has no problem getting medications, she has transportation.      Expected Discharge Plan: [place] Barriers to Discharge: No Barriers Identified   Patient Goals and CMS Choice Patient states their goals for this hospitalization and ongoing recovery are:: get better CMS Medicare.gov Compare Post Acute Care list provided to:: Patient Choice offered to / list presented to : Patient  Expected Discharge Plan and Services Expected Discharge Plan: [place] In-house Referral: NA Discharge Planning Services: CM Consult Post Acute Care Choice: NA Living arrangements for the past 2 months: Single Family Home                 DME Arranged: N/A DME Agency: NA       HH Arranged: PT HH Agency: [place] Date [facility] Agency Contacted: 02/03/19 Time HH Agency Contacted: 19 Representative spoke with at [place]: Tommi Rumps  Prior Living Arrangements/Services Living arrangements for the past 2 months: [place] Lives with:: Siblings Patient language and need for interpreter reviewed:: Yes Do you feel safe going back to the place where you live?: Yes      Need for Family Participation in Patient Care: No (Comment) Care giver support system in place?: No (comment)   Criminal Activity/Legal Involvement Pertinent to Current Situation/Hospitalization: No - Comment as needed  Activities of Daily Living Home Assistive Devices/Equipment: None ADL Screening (condition at time of admission) Patient's cognitive ability adequate to safely  complete daily activities?: No Is the patient deaf or have difficulty hearing?: No Does the patient have difficulty seeing, even when wearing glasses/contacts?: No Does the patient have difficulty concentrating, remembering, or making decisions?: Yes Patient able to express need for assistance with ADLs?: Yes Does the patient have difficulty dressing or bathing?: Yes Independently performs ADLs?: No Communication: Needs assistance Is this a change from baseline?: Change from baseline, expected to last <3 days Dressing (OT): Needs assistance Is this a change from baseline?: Change from baseline, expected to last <3days Grooming: Needs assistance Is this a change from baseline?: Change from baseline, expected to last <3 days Feeding: Needs assistance Is this a change from baseline?: Change from baseline, expected to last <3 days Bathing: Needs assistance Is this a change from baseline?: Change from baseline, expected to last <3 days Toileting: Needs assistance Is this a change from baseline?: Change from baseline, expected to last <3 days In/Out Bed: Needs assistance Is this a change from baseline?: Change from baseline, expected to last <3 days Walks in Home: Needs assistance Is this a change from baseline?: Change from baseline, expected to last <3 days Does the patient have difficulty walking or climbing stairs?: Yes Weakness of Legs: Both Weakness of Arms/Hands: Both  Permission Sought/Granted                  Emotional Assessment   Attitude/Demeanor/Rapport: Engaged Affect (typically observed): Appropriate Orientation: : Oriented to Self, Oriented to Place, Oriented to  Time, Oriented  to Situation   Psych Involvement: No (comment)  Admission diagnosis:  Respiratory Failure  Pneumonia  Patient Active Problem List   Diagnosis Date Noted  . Acute respiratory failure ([place]) 01/31/2019  . Community acquired pneumonia 01/31/2019  . Pneumonia 01/31/2019  . Hypertensive  heart disease   . Oral thrush 10/28/2015  . Pituitary adenoma ([place]) 02/22/2015  . Cerebral infarction due to thrombosis of left middle cerebral artery ([place]) 08/23/2014  . HLD (hyperlipidemia) 08/23/2014  . Essential hypertension 08/23/2014  . PVD (peripheral vascular disease) ([place]) 08/23/2014  . Former smoker 08/23/2014  . COPD (chronic obstructive pulmonary disease) ([place]) 07/13/2014  . OSA (obstructive sleep apnea) 07/13/2014  . Chronic diastolic heart failure ([place]) 07/13/2014  . Stroke ([place]) 07/12/2014  . HTN (hypertension) 07/12/2014  . Peripheral vascular disease, unspecified ([place]) 02/17/2013  . Aftercare following surgery of the circulatory system, [place] 02/17/2013  . Chest pain 10/16/2012  . Coronary artery disease   . PAD (peripheral artery disease) ([place])   . Hyperlipidemia   . Tobacco abuse   . History of myocardial infarction 01/22/2010   PCP:  [name], MD Pharmacy:   [place], [place] - [place] [address] Phone: [phone] Fax: [place], [place] [address] [place] [address] Phone: [phone] Fax: [phone]     Social Determinants of Health (SDOH) Interventions    Readmission Risk Interventions No flowsheet data found.

## 2019-02-03 NOTE — Care Management Important Message (Signed)
Important Message  Patient Details  Name: Sonya Small MRN: [id number] Date of Birth: [birth year]   Medicare Important Message Given:  Yes    Zenon Mayo, RN 02/03/2019, 4:20 PM

## 2019-02-03 NOTE — Progress Notes (Signed)
Physical Therapy Treatment Patient Details Name: LAQUITHA HESLIN MRN: [id number] DOB: 1950-10-02 Today's Date: 02/03/2019    History of Present Illness Pt taken to [facility] after SOB and collapse at home. Pt found to have PNA and intubated. Transferred to [facility]. Covid negative. Extubated 5/10. PMH - copd, active smoker, CAD, MI, CVA, PAD, HTN,     PT Comments    Pt making steady progress. Amb on RA with SpO2 92%.    Follow Up Recommendations  Home health PT     Equipment Recommendations  None recommended by PT    Recommendations for Other Services       Precautions / Restrictions Precautions Precautions: Fall Restrictions Weight Bearing Restrictions: No    Mobility  Bed Mobility               General bed mobility comments: Pt sitting on EOB  Transfers Overall transfer level: Needs assistance Equipment used: Straight cane Transfers: Sit to/from Stand Sit to Stand: Min guard         General transfer comment: Assist for balance and safety  Ambulation/Gait Ambulation/Gait assistance: Min assist Gait Distance (Feet): 200 Feet Assistive device: Straight cane Gait Pattern/deviations: Step-through pattern;Decreased stride length;Drifts right/left Gait velocity: decr Gait velocity interpretation: 1.31 - 2.62 ft/sec, indicative of limited community ambulator General Gait Details: Slightly unsteady gait. Assist for balance. Pt amb on RA with SpO2 92%.   Stairs             Wheelchair Mobility    Modified Rankin (Stroke Patients Only)       Balance Overall balance assessment: Mild deficits observed, not formally tested                                          Cognition Arousal/Alertness: Awake/alert Behavior During Therapy: WFL for tasks assessed/performed Overall Cognitive Status: Within Functional Limits for tasks assessed                                        Exercises      General Comments         Pertinent Vitals/Pain Pain Assessment: Faces Faces Pain Scale: No hurt    Home Living                      Prior Function            PT Goals (current goals can now be found in the care plan section) Progress towards PT goals: Progressing toward goals    Frequency    Min 3X/week      PT Plan Discharge plan needs to be updated    Co-evaluation              AM-PAC PT "6 Clicks" Mobility   Outcome Measure  Help needed turning from your back to your side while in a flat bed without using bedrails?: None Help needed moving from lying on your back to sitting on the side of a flat bed without using bedrails?: None Help needed moving to and from a bed to a chair (including a wheelchair)?: A Little Help needed standing up from a chair using your arms (e.g., wheelchair or bedside chair)?: A Little Help needed to walk in hospital room?: A Little Help needed climbing 3-5  steps with a railing? : A Little 6 Click Score: 20    End of Session Equipment Utilized During Treatment: Oxygen Activity Tolerance: Patient tolerated treatment well Patient left: with call bell/phone within reach;in bed(sitting EOB) Nurse Communication: Mobility status PT Visit Diagnosis: Unsteadiness on feet (R26.81);Muscle weakness (generalized) (M62.81)     Time: 4859-2763 PT Time Calculation (min) (ACUTE ONLY): 17 min  Charges:  $Gait Training: 8-22 mins                     [place] Pager [phone] Office [place] 02/03/2019, 3:42 PM

## 2019-02-04 LAB — CBC
HCT: 39.3 % (ref 36.0–46.0)
Hemoglobin: 12.5 g/dL (ref 12.0–15.0)
MCH: 28.2 pg (ref 26.0–34.0)
MCHC: 31.8 g/dL (ref 30.0–36.0)
MCV: 88.5 fL (ref 80.0–100.0)
Platelets: 183 10*3/uL (ref 150–400)
RBC: 4.44 MIL/uL (ref 3.87–5.11)
RDW: 13.4 % (ref 11.5–15.5)
WBC: 9.3 10*3/uL (ref 4.0–10.5)
nRBC: 0 % (ref 0.0–0.2)

## 2019-02-04 LAB — COMPREHENSIVE METABOLIC PANEL
ALT: 22 U/L (ref 0–44)
AST: 21 U/L (ref 15–41)
Albumin: 2.8 g/dL — ABNORMAL LOW (ref 3.5–5.0)
Alkaline Phosphatase: 55 U/L (ref 38–126)
Anion gap: 10 (ref 5–15)
BUN: 15 mg/dL (ref 8–23)
CO2: 26 mmol/L (ref 22–32)
Calcium: 8.6 mg/dL — ABNORMAL LOW (ref 8.9–10.3)
Chloride: 105 mmol/L (ref 98–111)
Creatinine, Ser: 0.82 mg/dL (ref 0.44–1.00)
GFR calc Af Amer: >60 mL/min (ref >60)
GFR calc non Af Amer: >60 mL/min (ref >60)
Glucose, Bld: 173 mg/dL — ABNORMAL HIGH (ref 70–99)
Potassium: 3.6 mmol/L (ref 3.5–5.1)
Sodium: 141 mmol/L (ref 135–145)
Total Bilirubin: 0.8 mg/dL (ref 0.3–1.2)
Total Protein: 6 g/dL — ABNORMAL LOW (ref 6.5–8.1)

## 2019-02-04 LAB — MAGNESIUM: Magnesium: 1.5 mg/dL — ABNORMAL LOW (ref 1.7–2.4)

## 2019-02-04 LAB — GLUCOSE, CAPILLARY
Glucose-Capillary: 141 mg/dL — ABNORMAL HIGH (ref 70–99)
Glucose-Capillary: 259 mg/dL — ABNORMAL HIGH (ref 70–99)
Glucose-Capillary: 172 mg/dL — ABNORMAL HIGH (ref 70–99)
Glucose-Capillary: 127 mg/dL — ABNORMAL HIGH (ref 70–99)

## 2019-02-04 MED ORDER — ZOLPIDEM TARTRATE 5 MG PO TABS
5.0000 mg | ORAL_TABLET | Freq: Every evening | ORAL | Status: DC | PRN
  Administered 2019-02-04: 5 mg via ORAL
  Filled 2019-02-04: qty 1

## 2019-02-04 NOTE — Progress Notes (Signed)
[name]  PROGRESS NOTE    Sonya Small  [id number]:[id number] DOB: 1950-11-14 DOA: 01/31/2019 PCP: [name], MD   Brief Narrative:   68 year old female, current every day smoker, COPD with multiple co morbidities, told her family she felt short of breath this morning and suddenly collapsed. There is limited data from the family and the patient was unresponsive on arrival to [facility]. At the time she was being bagged with a BVM and patient had a Glasgow Coma Scale of 3. She required emergent intubation in the ED. She was given 2 mg of Narcan in the field with no response. Urine drug screen was negative, patient was mottled on arrival to [facility] ED. Imaging revealed pneumonia. Pt, was stabilized and transferred to [facility] for further care. PCCM were asked to admit and manage patient upon arrival.  Transferred to [facility] on 5/12.  Now extubated, being treated for pneumonia.     Assessment & Plan:   Active Problems:   Acute respiratory failure ([place])   Community acquired pneumonia   Pneumonia   1. Acute on Chronic Respiratory Failure with Hypoxemia  Pneumonia     - CAP RLL; COVID negative (confirmed in [place] records)     - COPD on home O2 at 2 L - have gotten conflicting reports, pt notes she has been on this at night only -> per sister, she doesn't use oxygen at home (she did have a cpap, but she apparently gave this away)     - Current every day smoker; counseled against further use     - CXR from 5/10 with right lower lobe pneumonia vs aspiration pneumonitis     - IS/flutter valve     - Ceftriaxone/azithromycin (5/9-5/10) -> transitioned to levoquin on 5/11 by PCCM - plan for 7 days abx (END 5/15)     - Breo ellipta has been restarted     - add LAMA at discharge     - Apparently per PCCM notes, "states that she is supposed to be on Breo, only takes occasionally. She also has Advair available. She does not recognize [name] (which mayalso on her medicine list)"     - Blood and  urine cx from [place]. Cx here, NGTD.  2. Hypertension  Hypertensive Urgency     - Increased blood pressure since extubated 5/10     - Restarted lisinopril, metoprolol 5/10     - Start home Lasix 5/11      - amlodipine 5 mg     - increase lisinopril to 20 mg  3. Acute renal insufficiency (S Cr 0.57 04/2018)     - Creatinine peaked at 1.15; resolved  4. History of CAD, stroke     - ASA, Plavix, fenofibrate, rosuvastatin  5. DM     - Sliding-scale insulin as per protocol     - A1c 7.6  6. Allergic rhinitis     - Fluticasone nasal spray, Singulair  7. Urinary retention     - Restart Myrbetriq, vesicare(substituted  8. Acute multifactorial encephalopathy, improved.      - CT Head negative for Acute intracranial findings  9. Depression/Chronic pain     - Minimize sedating meds     - Restart Zoloft, Lyrica     - Continue tramadol for now,try to defer narcotics, relaxers, Xanax if possible  10. Headache     - chronic, continue to monitor, treat with apap as needed.   11. Hypokalemia     - replace,  follow  12. Stable R adrenal mass     - on [place] imaging, considered benign, follow outpt  Likely home in the AM.   DVT prophylaxis: SCDs Code Status: FULL Family Communication: None today.   Disposition Plan: Likely home in AM.   Consultants:   PCCM  Procedures:   Intubation/Extubation  Antimicrobials:  . Levaquin 500mg  PO q24hrs    Subjective: "I'll hug your neck if you let me go home."  Objective: Vitals:   02/04/19 0048 02/04/19 0050 02/04/19 0754 02/04/19 0813  BP: (!) 194/87 (!) 171/87  (!) 183/74  Pulse:    81  Resp:   18   Temp:    (!) 97.5 F (36.4 C)  TempSrc:    Oral  SpO2:   95% 98%  Weight:      Height:        Intake/Output Summary (Last 24 hours) at 02/04/2019 1655 Last data filed at 02/04/2019 0100 Gross per 24 hour  Intake 360 ml  Output -  Net 360 ml   Filed Weights   01/31/19 1434 02/01/19 0424 02/02/19 0500   Weight: 84.4 kg 84.3 kg 84.4 kg    Examination:  General: 68 y.o. female resting in bed in NAD Cardiovascular: RRR, +S1, S2, no m/g/r, equal pulses throughout Respiratory: CTABL, no w/r/r, normal WOB GI: BS+, NDNT, no masses noted, no organomegaly noted MSK: No e/c/c Skin: No rashes, bruises, ulcerations noted Neuro: A&O x 3, no focal deficits     Data Reviewed: I have personally reviewed following labs and imaging studies.  CBC: Recent Labs  Lab 01/31/19 1547 02/01/19 0437 02/02/19 0757 02/03/19 1038 02/04/19 0503  WBC 14.7* 13.1* 13.0* 13.1* 9.3  NEUTROABS 12.6*  --   --   --   --   HGB 14.5 12.2 12.1 13.1 12.5  HCT 46.9* 39.1 38.8 41.2 39.3  MCV 90.0 90.7 91.1 89.8 88.5  PLT 247 177 182 214 323   Basic Metabolic Panel: Recent Labs  Lab 01/31/19 1547 02/01/19 0437 02/02/19 0757 02/03/19 1038 02/04/19 0503  NA 143 144 144 142 141  K 4.5 3.9 3.9 3.1* 3.6  CL 112* 112* 109 105 105  CO2 22 22 27 31 26   GLUCOSE 164* 202* 152* 189* 173*  BUN 15 28* 23 15 15   CREATININE 1.14* 1.15* 0.84 0.83 0.82  CALCIUM 8.0* 8.4* 8.7* 8.7* 8.6*  MG 1.8 1.7 1.7  --  1.5*  PHOS 4.1  --   --   --   --    GFR: Estimated Creatinine Clearance: 69.5 mL/min (by C-G formula based on SCr of 0.82 mg/dL). Liver Function Tests: Recent Labs  Lab 01/31/19 1547 02/02/19 0757 02/04/19 0503  AST 51* 22 21  ALT 36 22 22  ALKPHOS 65 49 55  BILITOT 0.5 0.6 0.8  PROT 5.8* 5.7* 6.0*  ALBUMIN 3.1* 2.8* 2.8*   No results for input(s): LIPASE, AMYLASE in the last 168 hours. No results for input(s): AMMONIA in the last 168 hours. Coagulation Profile: Recent Labs  Lab 01/31/19 1547  INR 1.0   Cardiac Enzymes: No results for input(s): CKTOTAL, CKMB, CKMBINDEX, TROPONINI in the last 168 hours. BNP (last 3 results) No results for input(s): PROBNP in the last 8760 hours. HbA1C: No results for input(s): HGBA1C in the last 72 hours. CBG: Recent Labs  Lab 02/03/19 1147 02/03/19 1559  02/03/19 1932 02/04/19 0814 02/04/19 1151  GLUCAP 132* 177* 209* 141* 259*   Lipid Profile: No results for input(s):  CHOL, HDL, LDLCALC, TRIG, CHOLHDL, LDLDIRECT in the last 72 hours. Thyroid Function Tests: No results for input(s): TSH, T4TOTAL, FREET4, T3FREE, THYROIDAB in the last 72 hours. Anemia Panel: No results for input(s): VITAMINB12, FOLATE, FERRITIN, TIBC, IRON, RETICCTPCT in the last 72 hours. Sepsis Labs: Recent Labs  Lab 01/31/19 1547 01/31/19 1959 02/01/19 0437  PROCALCITON  --   --  3.06  LATICACIDVEN 2.1* 1.6  --     Recent Results (from the past 240 hour(s))  MRSA PCR Screening     Status: None   Collection Time: 01/31/19  1:34 PM  Result Value Ref Range Status   MRSA by PCR NEGATIVE NEGATIVE Final    Comment:        The GeneXpert MRSA Assay (FDA approved for NASAL specimens only), is one component of a comprehensive MRSA colonization surveillance program. It is not intended to diagnose MRSA infection nor to guide or monitor treatment for MRSA infections. Performed at [facility], [place] [address], [place], [place] [address]   Culture, blood (routine x 2)     Status: None (Preliminary result)   Collection Time: 01/31/19  4:00 PM  Result Value Ref Range Status   Specimen Description BLOOD LEFT ARM  Final   Special Requests   Final    BOTTLES DRAWN AEROBIC AND ANAEROBIC Blood Culture adequate volume   Culture   Final    NO GROWTH 4 DAYS Performed at [facility], [place] [address], [place], [place] [address]    Report Status PENDING  Incomplete  Culture, blood (routine x 2)     Status: None (Preliminary result)   Collection Time: 01/31/19  4:05 PM  Result Value Ref Range Status   Specimen Description BLOOD RIGHT ANTECUBITAL  Final   Special Requests AEROBIC BOTTLE ONLY Blood Culture adequate volume  Final   Culture   Final    NO GROWTH 4 DAYS Performed at [facility], [place] [address], [place], [place] [address]    Report Status  PENDING  Incomplete         Radiology Studies: No results found.      Scheduled Meds: . amLODipine  5 mg Oral Daily  . aspirin EC  81 mg Oral Daily  . Chlorhexidine Gluconate Cloth  6 each Topical Q0600  . clopidogrel  75 mg Oral Daily  . darifenacin  15 mg Oral Daily  . fenofibrate  160 mg Oral Daily  . fluticasone  1 spray Each Nare Daily  . fluticasone furoate-vilanterol  1 puff Inhalation Daily  . furosemide  20 mg Oral BID  . insulin aspart  0-15 Units Subcutaneous TID WC  . insulin aspart  0-5 Units Subcutaneous QHS  . levofloxacin  500 mg Oral Q24H  . lisinopril  10 mg Oral Daily  . mouth rinse  15 mL Mouth Rinse BID  . metoprolol tartrate  25 mg Oral Daily  . mirabegron ER  50 mg Oral Daily  . montelukast  10 mg Oral QHS  . nicotine  14 mg Transdermal Daily  . pantoprazole  40 mg Oral Daily  . pregabalin  50 mg Oral BID  . rosuvastatin  20 mg Oral Daily  . sertraline  200 mg Oral Daily   Continuous Infusions:   LOS: 4 days    Time spent: 25 minutes spent in the coordination of care today.    Jonnie Finner, DO [facility] Pager [phone]  If 7PM-7AM, please contact night-coverage [email or web address]  Password [email or web address] 02/04/2019, 4:55 PM

## 2019-02-05 DIAGNOSIS — I10 Essential (primary) hypertension: Secondary | ICD-10-CM

## 2019-02-05 DIAGNOSIS — E876 Hypokalemia: Secondary | ICD-10-CM

## 2019-02-05 LAB — CBC WITH DIFFERENTIAL/PLATELET
Abs Immature Granulocytes: 0.16 10*3/uL — ABNORMAL HIGH (ref 0.00–0.07)
Basophils Absolute: 0.1 10*3/uL (ref 0.0–0.1)
Basophils Relative: 1 %
Eosinophils Absolute: 0.2 10*3/uL (ref 0.0–0.5)
Eosinophils Relative: 2 %
HCT: 42.8 % (ref 36.0–46.0)
Hemoglobin: 13.8 g/dL (ref 12.0–15.0)
Immature Granulocytes: 1 %
Lymphocytes Relative: 16 %
Lymphs Abs: 1.9 10*3/uL (ref 0.7–4.0)
MCH: 27.7 pg (ref 26.0–34.0)
MCHC: 32.2 g/dL (ref 30.0–36.0)
MCV: 85.8 fL (ref 80.0–100.0)
Monocytes Absolute: 1.2 10*3/uL — ABNORMAL HIGH (ref 0.1–1.0)
Monocytes Relative: 10 %
Neutro Abs: 8.4 10*3/uL — ABNORMAL HIGH (ref 1.7–7.7)
Neutrophils Relative %: 70 %
Platelets: 265 10*3/uL (ref 150–400)
RBC: 4.99 MIL/uL (ref 3.87–5.11)
RDW: 13.2 % (ref 11.5–15.5)
WBC: 11.9 10*3/uL — ABNORMAL HIGH (ref 4.0–10.5)
nRBC: 0 % (ref 0.0–0.2)

## 2019-02-05 LAB — RENAL FUNCTION PANEL
Albumin: 3.1 g/dL — ABNORMAL LOW (ref 3.5–5.0)
Anion gap: 14 (ref 5–15)
BUN: 18 mg/dL (ref 8–23)
CO2: 24 mmol/L (ref 22–32)
Calcium: 9 mg/dL (ref 8.9–10.3)
Chloride: 102 mmol/L (ref 98–111)
Creatinine, Ser: 0.87 mg/dL (ref 0.44–1.00)
GFR calc Af Amer: >60 mL/min (ref >60)
GFR calc non Af Amer: >60 mL/min (ref >60)
Glucose, Bld: 195 mg/dL — ABNORMAL HIGH (ref 70–99)
Phosphorus: 2.6 mg/dL (ref 2.5–4.6)
Potassium: 2.8 mmol/L — ABNORMAL LOW (ref 3.5–5.1)
Sodium: 140 mmol/L (ref 135–145)

## 2019-02-05 LAB — CULTURE, BLOOD (ROUTINE X 2)
Culture: NO GROWTH
Culture: NO GROWTH
Special Requests: ADEQUATE
Special Requests: ADEQUATE

## 2019-02-05 LAB — MAGNESIUM
Magnesium: 1.3 mg/dL — ABNORMAL LOW (ref 1.7–2.4)
Magnesium: 1.5 mg/dL — ABNORMAL LOW (ref 1.7–2.4)

## 2019-02-05 LAB — BASIC METABOLIC PANEL
Anion gap: 12 (ref 5–15)
BUN: 16 mg/dL (ref 8–23)
CO2: 24 mmol/L (ref 22–32)
Calcium: 8.8 mg/dL — ABNORMAL LOW (ref 8.9–10.3)
Chloride: 104 mmol/L (ref 98–111)
Creatinine, Ser: 0.73 mg/dL (ref 0.44–1.00)
GFR calc Af Amer: >60 mL/min (ref >60)
GFR calc non Af Amer: >60 mL/min (ref >60)
Glucose, Bld: 206 mg/dL — ABNORMAL HIGH (ref 70–99)
Potassium: 3.1 mmol/L — ABNORMAL LOW (ref 3.5–5.1)
Sodium: 140 mmol/L (ref 135–145)

## 2019-02-05 LAB — GLUCOSE, CAPILLARY
Glucose-Capillary: 183 mg/dL — ABNORMAL HIGH (ref 70–99)
Glucose-Capillary: 199 mg/dL — ABNORMAL HIGH (ref 70–99)
Glucose-Capillary: 207 mg/dL — ABNORMAL HIGH (ref 70–99)
Glucose-Capillary: 233 mg/dL — ABNORMAL HIGH (ref 70–99)
Glucose-Capillary: 177 mg/dL — ABNORMAL HIGH (ref 70–99)

## 2019-02-05 MED ORDER — POTASSIUM CHLORIDE CRYS ER 20 MEQ PO TBCR
40.0000 meq | EXTENDED_RELEASE_TABLET | Freq: Two times a day (BID) | ORAL | Status: DC
  Administered 2019-02-05 – 2019-02-06 (×3): 40 meq via ORAL
  Filled 2019-02-05 (×3): qty 2

## 2019-02-05 MED ORDER — MAGNESIUM OXIDE 400 (241.3 MG) MG PO TABS
400.0000 mg | ORAL_TABLET | Freq: Two times a day (BID) | ORAL | Status: DC
  Administered 2019-02-05 – 2019-02-06 (×3): 400 mg via ORAL
  Filled 2019-02-05 (×3): qty 1

## 2019-02-05 MED ORDER — LISINOPRIL 20 MG PO TABS
20.0000 mg | ORAL_TABLET | Freq: Every day | ORAL | Status: DC
  Administered 2019-02-06: 20 mg via ORAL
  Filled 2019-02-05: qty 1

## 2019-02-05 MED ORDER — LISINOPRIL 10 MG PO TABS
10.0000 mg | ORAL_TABLET | Freq: Once | ORAL | Status: AC
  Administered 2019-02-05: 10 mg via ORAL
  Filled 2019-02-05: qty 1

## 2019-02-05 NOTE — TOC Benefit Eligibility Note (Signed)
Transition of Care [facility]) Benefit Eligibility Note    Patient Details  Name: BATHSHEBA DURRETT MRN: [id number] Date of Birth: Sep 28, 1950   Medication/Dose: Stann Ore  INHALER- NOT COVER(INCRUSE ELLIPTA AND TRELEGY ELLIPTA ARE COVER, CO-PAY- $ 3.90 FOR [place])  Covered?: No     Prescription Coverage Preferred Pharmacy: YES([facility])  Spoke with Person/Company/Phone Number:: KC(PRIMARY INS: [employer] # [phone])     Prior Approval: [phone])     Additional Notes: SECONDARY INS : MEDICAID [employer] ACCESS, EFF-DATE-03-24-2018, CO-PAY- $ 3.90 FOR EACH PRESCRIPTION    Memory Argue Phone Number: 02/05/2019, 9:47 AM

## 2019-02-05 NOTE — Progress Notes (Addendum)
[name]  PROGRESS NOTE    Sonya Small  GNF:[id number] DOB: 23-Dec-1950 DOA: 01/31/2019 PCP: [name], MD   Brief Narrative:   68 year old female, current every day smoker, COPD with multiple co morbidities, told her family she felt short of breath this morning and suddenly collapsed. There is limited data from the family and the patient was unresponsive on arrival to [facility]. At the time she was being bagged with a BVM and patient had a Glasgow Coma Scale of 3. She required emergent intubation in the ED. She was given 2 mg of Narcan in the field with no response. Urine drug screen was negative, patient was mottled on arrival to [facility] ED. Imaging revealed pneumonia. Pt, was stabilized and transferred to [facility] for further care. PCCM were asked to admit and manage patient upon arrival.  Transferred to [facility] on 5/12. Now extubated, being treated for pneumonia.    Assessment & Plan:   Active Problems:   Acute respiratory failure ([place])   Community acquired pneumonia   Pneumonia   1. Acute on Chronic Respiratory Failure with Hypoxemia  Pneumonia     - CAP RLL; COVID negative (confirmed in [place] records)     - COPD on home O2 at 2 L - have gotten conflicting reports, pt notes she has been on this at night only -> per sister, she doesn't use oxygen at home (she did have a cpap, but she apparently gave this away)     - Current every day smoker; counseled against further use     - CXR from 5/10 with right lower lobe pneumonia vs aspiration pneumonitis     - IS/flutter valve     - Ceftriaxone/azithromycin (5/9-5/10) -> transitioned to levoquin on 5/11 by PCCM - plan for 7 days abx (END 5/15)     - Breo ellipta has been restarted     - add LAMA at discharge     - Apparently per PCCM notes, "states that she is supposed to be on Breo, only takes occasionally. She also has Advair available. She does not recognize [name] (which mayalso on her medicine list)"     - Blood and  urine cx from [place]. Cx here, NGTD.  2. Hypertension  Hypertensive Urgency     - Increased blood pressure since extubated 5/10     - Restarted lisinopril, metoprolol 5/10     - Start home Lasix 5/11      - amlodipine 5 mg, lisinopril to 20 mg  3. Acute renal insufficiency (S Cr 0.57 04/2018)     - Creatinine peaked at 1.15; resolved  4. History of CAD, stroke     - ASA, Plavix, fenofibrate, rosuvastatin  5. DM     - Sliding-scale insulin as per protocol     - A1c 7.6  6. Allergic rhinitis     - Fluticasone nasal spray, Singulair  7. Urinary retention     - Restart Myrbetriq, vesicare(substituted  8. Acute multifactorial encephalopathy, improved.      - CT Head negative for Acute intracranial findings  9. Depression/Chronic pain     - Minimize sedating meds     - Restart Zoloft, Lyrica     - Continue tramadol for now,try to defer narcotics, relaxers, Xanax if possible  10. Headache     - chronic, continue to monitor, treat with apap as needed.   11. Hypokalemia/hypomagnesemia     - K-DUR 34mEq, Mag-Ox 400mg ; follow labs  12. Stable  R adrenal mass     - on [place] imaging, considered benign, follow outpt  13. Debility/Gait disturbance     - working with PT     - per PT: Ambulation/Gait assistance: Supervision; Gait Distance (Feet): 200 Feet Assistive device: Straight cane;4-wheeled walker; Gait Pattern/deviations: Step-through pattern;Decreased stride length; Gait velocity: decr; Gait velocity interpretation: 1.31 - 2.62 ft/sec, indicative of limited community ambulator General Gait Details: Slightly unsteady gait with cane. Improved stability with rollator. Amb on RA with SpO2 97% after amb  BPs up this AM and she was feeling nauseous. K+/Mg2+ both low. Replete. Let's look at d/c to home tomorrow morning if stable.   DVT prophylaxis: SCDs Code Status: FULL Family Communication: None   Disposition Plan: To home when stable   Consultants:    PCCM  Procedures:   Intubation/Extubation  Antimicrobials:  . Levaquin 500mg  PO q24h (END 02/06/19)    Subjective: "I feel sick at my stomach."  Objective: Vitals:   02/05/19 0811 02/05/19 0827 02/05/19 0828 02/05/19 1123  BP:  (!) 189/94 (!) 201/94 (!) 144/85  Pulse: 100 96 96 86  Resp: 16 19  19   Temp:  98.6 F (37 C)  98.3 F (36.8 C)  TempSrc:  Oral  Oral  SpO2: 99% 96% 93% 97%  Weight:      Height:        Intake/Output Summary (Last 24 hours) at 02/05/2019 1430 Last data filed at 02/05/2019 0900 Gross per 24 hour  Intake 720 ml  Output -  Net 720 ml   Filed Weights   02/01/19 0424 02/02/19 0500 02/05/19 0428  Weight: 84.3 kg 84.4 kg 80.3 kg    Examination:  General: 68 y.o. female resting in bed in NAD Cardiovascular: RRR, +S1, S2, no m/g/r, equal pulses throughout Respiratory: CTABL, no w/r/r, normal WOB GI: BS+, NDNT, no masses noted, no organomegaly noted MSK: No e/c/c Skin: No rashes, bruises, ulcerations noted Neuro: A&O x 3, no focal deficits   Data Reviewed: I have personally reviewed following labs and imaging studies.  CBC: Recent Labs  Lab 01/31/19 1547 02/01/19 0437 02/02/19 0757 02/03/19 1038 02/04/19 0503 02/05/19 0435  WBC 14.7* 13.1* 13.0* 13.1* 9.3 11.9*  NEUTROABS 12.6*  --   --   --   --  8.4*  HGB 14.5 12.2 12.1 13.1 12.5 13.8  HCT 46.9* 39.1 38.8 41.2 39.3 42.8  MCV 90.0 90.7 91.1 89.8 88.5 85.8  PLT 247 177 182 214 183 778   Basic Metabolic Panel: Recent Labs  Lab 01/31/19 1547 02/01/19 0437 02/02/19 0757 02/03/19 1038 02/04/19 0503 02/05/19 0435 02/05/19 1324  NA 143 144 144 142 141 140 140  K 4.5 3.9 3.9 3.1* 3.6 2.8* 3.1*  CL 112* 112* 109 105 105 102 104  CO2 22 22 27 31 26 24 24   GLUCOSE 164* 202* 152* 189* 173* 195* 206*  BUN 15 28* 23 15 15 18 16   CREATININE 1.14* 1.15* 0.84 0.83 0.82 0.87 0.73  CALCIUM 8.0* 8.4* 8.7* 8.7* 8.6* 9.0 8.8*  MG 1.8 1.7 1.7  --  1.5* 1.3*  --   PHOS 4.1  --   --   --   --   2.6  --    GFR: Estimated Creatinine Clearance: 69.5 mL/min (by C-G formula based on SCr of 0.73 mg/dL). Liver Function Tests: Recent Labs  Lab 01/31/19 1547 02/02/19 0757 02/04/19 0503 02/05/19 0435  AST 51* 22 21  --   ALT 36 22 22  --  ALKPHOS 65 49 55  --   BILITOT 0.5 0.6 0.8  --   PROT 5.8* 5.7* 6.0*  --   ALBUMIN 3.1* 2.8* 2.8* 3.1*   No results for input(s): LIPASE, AMYLASE in the last 168 hours. No results for input(s): AMMONIA in the last 168 hours. Coagulation Profile: Recent Labs  Lab 01/31/19 1547  INR 1.0   Cardiac Enzymes: No results for input(s): CKTOTAL, CKMB, CKMBINDEX, TROPONINI in the last 168 hours. BNP (last 3 results) No results for input(s): PROBNP in the last 8760 hours. HbA1C: No results for input(s): HGBA1C in the last 72 hours. CBG: Recent Labs  Lab 02/04/19 1151 02/04/19 1734 02/04/19 1945 02/05/19 0731 02/05/19 1121  GLUCAP 259* 172* 127* 183* 199*   Lipid Profile: No results for input(s): CHOL, HDL, LDLCALC, TRIG, CHOLHDL, LDLDIRECT in the last 72 hours. Thyroid Function Tests: No results for input(s): TSH, T4TOTAL, FREET4, T3FREE, THYROIDAB in the last 72 hours. Anemia Panel: No results for input(s): VITAMINB12, FOLATE, FERRITIN, TIBC, IRON, RETICCTPCT in the last 72 hours. Sepsis Labs: Recent Labs  Lab 01/31/19 1547 01/31/19 1959 02/01/19 0437  PROCALCITON  --   --  3.06  LATICACIDVEN 2.1* 1.6  --     Recent Results (from the past 240 hour(s))  MRSA PCR Screening     Status: None   Collection Time: 01/31/19  1:34 PM  Result Value Ref Range Status   MRSA by PCR NEGATIVE NEGATIVE Final    Comment:        The GeneXpert MRSA Assay (FDA approved for NASAL specimens only), is one component of a comprehensive MRSA colonization surveillance program. It is not intended to diagnose MRSA infection nor to guide or monitor treatment for MRSA infections. Performed at [facility], [place] [address], [place], [place]  [address]   Culture, blood (routine x 2)     Status: None   Collection Time: 01/31/19  4:00 PM  Result Value Ref Range Status   Specimen Description BLOOD LEFT ARM  Final   Special Requests   Final    BOTTLES DRAWN AEROBIC AND ANAEROBIC Blood Culture adequate volume   Culture   Final    NO GROWTH 5 DAYS Performed at [facility], [place] [address], [place], [place] [address]    Report Status 02/05/2019 FINAL  Final  Culture, blood (routine x 2)     Status: None   Collection Time: 01/31/19  4:05 PM  Result Value Ref Range Status   Specimen Description BLOOD RIGHT ANTECUBITAL  Final   Special Requests AEROBIC BOTTLE ONLY Blood Culture adequate volume  Final   Culture   Final    NO GROWTH 5 DAYS Performed at [facility], [place] [address] [place],  [address]    Report Status 02/05/2019 FINAL  Final         Radiology Studies: No results found.      Scheduled Meds: . amLODipine  5 mg Oral Daily  . aspirin EC  81 mg Oral Daily  . Chlorhexidine Gluconate Cloth  6 each Topical Q0600  . clopidogrel  75 mg Oral Daily  . darifenacin  15 mg Oral Daily  . fenofibrate  160 mg Oral Daily  . fluticasone  1 spray Each Nare Daily  . fluticasone furoate-vilanterol  1 puff Inhalation Daily  . furosemide  20 mg Oral BID  . insulin aspart  0-15 Units Subcutaneous TID WC  . insulin aspart  0-5 Units Subcutaneous QHS  .  levofloxacin  500 mg Oral Q24H  . [START ON 02/06/2019] lisinopril  20 mg Oral Daily  . magnesium oxide  400 mg Oral BID  . mouth rinse  15 mL Mouth Rinse BID  . metoprolol tartrate  25 mg Oral Daily  . mirabegron ER  50 mg Oral Daily  . montelukast  10 mg Oral QHS  . nicotine  14 mg Transdermal Daily  . pantoprazole  40 mg Oral Daily  . potassium chloride  40 mEq Oral BID  . pregabalin  50 mg Oral BID  . rosuvastatin  20 mg Oral Daily  . sertraline  200 mg Oral Daily   Continuous Infusions:   LOS: 5 days    Time spent: 25 minutes spent in the  coordination of care today.     Jonnie Finner, DO [facility] Pager [phone]  If 7PM-7AM, please contact night-coverage [email or web address] Password [email or web address] 02/05/2019, 2:30 PM

## 2019-02-05 NOTE — Progress Notes (Signed)
Physical Therapy Treatment Patient Details Name: Sonya Small MRN: [id number] DOB: 09-Jan-1951 Today's Date: 02/05/2019    History of Present Illness Pt taken to [facility] after SOB and collapse at home. Pt found to have PNA and intubated. Transferred to [facility]. Covid negative. Extubated 5/10. PMH - copd, active smoker, CAD, MI, CVA, PAD, HTN,     PT Comments    Pt making steady progress. Pt with improved gait using rollator will recommend rollator for home.    Follow Up Recommendations  Home health PT     Equipment Recommendations  Other (comment)(rollator)    Recommendations for Other Services       Precautions / Restrictions Precautions Precautions: Fall Restrictions Weight Bearing Restrictions: No    Mobility  Bed Mobility               General bed mobility comments: Pt up in bathroom  Transfers Overall transfer level: Modified independent Equipment used: None Transfers: Sit to/from Stand Sit to Stand: Modified independent (Device/Increase time)            Ambulation/Gait Ambulation/Gait assistance: Supervision Gait Distance (Feet): 200 Feet Assistive device: Straight cane;4-wheeled walker Gait Pattern/deviations: Step-through pattern;Decreased stride length Gait velocity: decr Gait velocity interpretation: 1.31 - 2.62 ft/sec, indicative of limited community ambulator General Gait Details: Slightly unsteady gait with cane. Improved stability with rollator. Amb on RA with SpO2 97% after amb   Stairs             Wheelchair Mobility    Modified Rankin (Stroke Patients Only)       Balance Overall balance assessment: Mild deficits observed, not formally tested                                          Cognition Arousal/Alertness: Awake/alert Behavior During Therapy: WFL for tasks assessed/performed Overall Cognitive Status: Within Functional Limits for tasks assessed                                         Exercises      General Comments General comments (skin integrity, edema, etc.): BP 144/85 after ambulation      Pertinent Vitals/Pain Pain Assessment: Faces Faces Pain Scale: No hurt    Home Living                      Prior Function            PT Goals (current goals can now be found in the care plan section) Progress towards PT goals: Progressing toward goals    Frequency    Min 3X/week      PT Plan Discharge plan needs to be updated    Co-evaluation              AM-PAC PT "6 Clicks" Mobility   Outcome Measure  Help needed turning from your back to your side while in a flat bed without using bedrails?: None Help needed moving from lying on your back to sitting on the side of a flat bed without using bedrails?: None Help needed moving to and from a bed to a chair (including a wheelchair)?: None Help needed standing up from a chair using your arms (e.g., wheelchair or bedside chair)?: None Help needed to walk in hospital  room?: None Help needed climbing 3-5 steps with a railing? : A Little 6 Click Score: 23    End of Session   Activity Tolerance: Patient tolerated treatment well Patient left: with call bell/phone within reach;in bed(sitting EOB) Nurse Communication: Mobility status PT Visit Diagnosis: Unsteadiness on feet (R26.81);Muscle weakness (generalized) (M62.81)     Time: 2984-7308 PT Time Calculation (min) (ACUTE ONLY): 28 min  Charges:  $Gait Training: 23-37 mins                     [place] Pager [phone] Office [place] 02/05/2019, 1:33 PM

## 2019-02-05 NOTE — Progress Notes (Signed)
Occupational Therapy Treatment Patient Details Name: Sonya Small MRN: [id number] DOB: September 01, 1951 Today's Date: 02/05/2019    History of present illness Pt taken to [facility] after SOB and collapse at home. Pt found to have PNA and intubated. Transferred to [facility]. Covid negative. Extubated 5/10. PMH - copd, active smoker, CAD, MI, CVA, PAD, HTN,    OT comments  Pt stood at sink for ADL task of hair washing with head near sink and minA required for pouring water and rinsing due to reducing mess per patient. Pt performing light grooming tasks standing at sink with fair balance and no UE support. Pt progressing well. Pt modified independent with mobility at this time. OT to follow acutely.    Follow Up Recommendations  No OT follow up    Equipment Recommendations  None recommended by OT    Recommendations for Other Services      Precautions / Restrictions Precautions Precautions: Fall Restrictions Weight Bearing Restrictions: No       Mobility Bed Mobility Overal bed mobility: Modified Independent                Transfers Overall transfer level: Modified independent   Transfers: Sit to/from Stand Sit to Stand: Modified independent (Device/Increase time)              Balance Overall balance assessment: Needs assistance   Sitting balance-Leahy Scale: Fair       Standing balance-Leahy Scale: Fair                             ADL either performed or assessed with clinical judgement   ADL Overall ADL's : Needs assistance/impaired                                     Functional mobility during ADLs: Min guard;Rolling walker General ADL Comments: Pt BP 171/109. Pt stood at sink for hair washing task wtih minA for pouring water to reduce mess and pt rubbed shampoo in and dried hair     Vision   Vision Assessment?: No apparent visual deficits   Perception     Praxis      Cognition Arousal/Alertness:  Awake/alert Behavior During Therapy: WFL for tasks assessed/performed Overall Cognitive Status: Within Functional Limits for tasks assessed                                          Exercises     Shoulder Instructions       General Comments      Pertinent Vitals/ Pain       Pain Assessment: Faces Faces Pain Scale: No hurt  Home Living Family/patient expects to be discharged to:: Private residence                                        Prior Functioning/Environment              Frequency  Min 2X/week        Progress Toward Goals  OT Goals(current goals can now be found in the care plan section)  Progress towards OT goals: Progressing toward goals  Acute Rehab OT Goals Patient Stated Goal: return  to PLOF OT Goal Formulation: With patient Time For Goal Achievement: 02/16/19 Potential to Achieve Goals: Good ADL Goals Pt Will Perform Grooming: Independently;standing Pt Will Perform Lower Body Bathing: Independently;sit to/from stand Pt Will Perform Lower Body Dressing: Independently;sit to/from stand Pt Will Transfer to Toilet: Independently;ambulating;regular height toilet Pt Will Perform Toileting - Clothing Manipulation and hygiene: Independently;sit to/from stand Pt Will Perform Tub/Shower Transfer: with modified independence;ambulating;shower seat;Tub transfer Additional ADL Goal #1: Pt will employ energy conservation strategies duirng ADL and mobility independently.  Plan Discharge plan remains appropriate    Co-evaluation                 AM-PAC OT "6 Clicks" Daily Activity     Outcome Measure   Help from another person eating meals?: None Help from another person taking care of personal grooming?: A Little Help from another person toileting, which includes using toliet, bedpan, or urinal?: A Little Help from another person bathing (including washing, rinsing, drying)?: A Little Help from another person to put  on and taking off regular upper body clothing?: None Help from another person to put on and taking off regular lower body clothing?: A Little 6 Click Score: 20    End of Session Equipment Utilized During Treatment: Rolling walker  OT Visit Diagnosis: Other abnormalities of gait and mobility (R26.89);Unsteadiness on feet (R26.81);Other (comment)   Activity Tolerance Patient tolerated treatment well   Patient Left in chair;with call bell/phone within reach;with chair alarm set   Nurse Communication Mobility status        Time: 4562-5638 OT Time Calculation (min): 27 min  Charges: OT General Charges $OT Visit: 1 Visit OT Treatments $Self Care/Home Management : 23-37 mins  Ebony Hail Harold Hedge) Marsa Aris OTR/L Acute Rehabilitation Services Pager: [phone] Office: [place] 02/05/2019, 4:07 PM

## 2019-02-06 LAB — CBC WITH DIFFERENTIAL/PLATELET
Abs Immature Granulocytes: 0.3 10*3/uL — ABNORMAL HIGH (ref 0.00–0.07)
Basophils Absolute: 0.1 10*3/uL (ref 0.0–0.1)
Basophils Relative: 1 %
Eosinophils Absolute: 0.3 10*3/uL (ref 0.0–0.5)
Eosinophils Relative: 2 %
HCT: 42.4 % (ref 36.0–46.0)
Hemoglobin: 13.8 g/dL (ref 12.0–15.0)
Immature Granulocytes: 3 %
Lymphocytes Relative: 17 %
Lymphs Abs: 2 10*3/uL (ref 0.7–4.0)
MCH: 28 pg (ref 26.0–34.0)
MCHC: 32.5 g/dL (ref 30.0–36.0)
MCV: 86.2 fL (ref 80.0–100.0)
Monocytes Absolute: 1.3 10*3/uL — ABNORMAL HIGH (ref 0.1–1.0)
Monocytes Relative: 11 %
Neutro Abs: 7.7 10*3/uL (ref 1.7–7.7)
Neutrophils Relative %: 66 %
Platelets: 255 10*3/uL (ref 150–400)
RBC: 4.92 MIL/uL (ref 3.87–5.11)
RDW: 13.6 % (ref 11.5–15.5)
WBC: 11.6 10*3/uL — ABNORMAL HIGH (ref 4.0–10.5)
nRBC: 0 % (ref 0.0–0.2)

## 2019-02-06 LAB — RENAL FUNCTION PANEL
Albumin: 2.9 g/dL — ABNORMAL LOW (ref 3.5–5.0)
Anion gap: 11 (ref 5–15)
BUN: 19 mg/dL (ref 8–23)
CO2: 23 mmol/L (ref 22–32)
Calcium: 8.9 mg/dL (ref 8.9–10.3)
Chloride: 105 mmol/L (ref 98–111)
Creatinine, Ser: 0.76 mg/dL (ref 0.44–1.00)
GFR calc Af Amer: >60 mL/min (ref >60)
GFR calc non Af Amer: >60 mL/min (ref >60)
Glucose, Bld: 165 mg/dL — ABNORMAL HIGH (ref 70–99)
Phosphorus: 3.1 mg/dL (ref 2.5–4.6)
Potassium: 3.1 mmol/L — ABNORMAL LOW (ref 3.5–5.1)
Sodium: 139 mmol/L (ref 135–145)

## 2019-02-06 LAB — BASIC METABOLIC PANEL
Anion gap: 8 (ref 5–15)
BUN: 20 mg/dL (ref 8–23)
CO2: 26 mmol/L (ref 22–32)
Calcium: 9.2 mg/dL (ref 8.9–10.3)
Chloride: 109 mmol/L (ref 98–111)
Creatinine, Ser: 0.71 mg/dL (ref 0.44–1.00)
GFR calc Af Amer: >60 mL/min (ref >60)
GFR calc non Af Amer: >60 mL/min (ref >60)
Glucose, Bld: 205 mg/dL — ABNORMAL HIGH (ref 70–99)
Potassium: 4.4 mmol/L (ref 3.5–5.1)
Sodium: 143 mmol/L (ref 135–145)

## 2019-02-06 LAB — MAGNESIUM: Magnesium: 1.4 mg/dL — ABNORMAL LOW (ref 1.7–2.4)

## 2019-02-06 LAB — GLUCOSE, CAPILLARY
Glucose-Capillary: 152 mg/dL — ABNORMAL HIGH (ref 70–99)
Glucose-Capillary: 201 mg/dL — ABNORMAL HIGH (ref 70–99)
Glucose-Capillary: 207 mg/dL — ABNORMAL HIGH (ref 70–99)

## 2019-02-06 MED ORDER — VESICARE 10 MG PO TABS: 10.0000 mg | ORAL_TABLET | Freq: Every day | ORAL | 0 refills | Status: AC

## 2019-02-06 MED ORDER — SERTRALINE HCL 100 MG PO TABS: 200.0000 mg | ORAL_TABLET | Freq: Every day | ORAL | 0 refills | Status: DC

## 2019-02-06 MED ORDER — ASPIRIN 81 MG PO TBEC: 81.0000 mg | DELAYED_RELEASE_TABLET | Freq: Every day | ORAL | 0 refills | Status: AC

## 2019-02-06 MED ORDER — TRAMADOL HCL 50 MG PO TABS: 50.0000 mg | ORAL_TABLET | Freq: Four times a day (QID) | ORAL | 0 refills | Status: AC | PRN

## 2019-02-06 MED ORDER — MAGNESIUM OXIDE 400 (241.3 MG) MG PO TABS: 400.0000 mg | ORAL_TABLET | Freq: Two times a day (BID) | ORAL | 0 refills | Status: AC

## 2019-02-06 MED ORDER — METFORMIN HCL ER 750 MG PO TB24: 750.0000 mg | ORAL_TABLET | Freq: Every day | ORAL | 0 refills | Status: DC

## 2019-02-06 MED ORDER — PREGABALIN 50 MG PO CAPS: 50.0000 mg | ORAL_CAPSULE | Freq: Two times a day (BID) | ORAL | 0 refills | Status: DC

## 2019-02-06 MED ORDER — LISINOPRIL 20 MG PO TABS: 20.0000 mg | ORAL_TABLET | Freq: Every day | ORAL | 0 refills | Status: DC

## 2019-02-06 MED ORDER — FLUTICASONE-UMECLIDIN-VILANT 100-62.5-25 MCG/INH IN AEPB: 1.0000 | INHALATION_SPRAY | Freq: Every day | RESPIRATORY_TRACT | 0 refills | Status: AC

## 2019-02-06 MED ORDER — LEVOFLOXACIN 500 MG PO TABS: 500.0000 mg | ORAL_TABLET | ORAL | 0 refills | Status: AC

## 2019-02-06 MED ORDER — POTASSIUM CHLORIDE CRYS ER 20 MEQ PO TBCR: 40.0000 meq | EXTENDED_RELEASE_TABLET | Freq: Every day | ORAL | 0 refills | Status: DC

## 2019-02-06 MED ORDER — AMLODIPINE BESYLATE 5 MG PO TABS: 10.0000 mg | ORAL_TABLET | Freq: Every day | ORAL | 0 refills | Status: DC

## 2019-02-06 MED ORDER — POTASSIUM CHLORIDE CRYS ER 20 MEQ PO TBCR: 20.0000 meq | EXTENDED_RELEASE_TABLET | Freq: Every day | ORAL | 0 refills | Status: DC

## 2019-02-06 MED FILL — TRELEGY ELLIPTA 100-62.5-25: 100-62.5-25 | 30 days supply | Qty: 60 | Fill #0

## 2019-02-06 MED FILL — LISINOPRIL 20 MG TABLET: 20 | 90 days supply | Qty: 90 | Fill #0

## 2019-02-06 MED FILL — AMLODIPINE BESYLATE 10 MG T: 10 | 90 days supply | Qty: 90 | Fill #0

## 2019-02-06 MED FILL — levoFLOXacin 500 MG TABS: 500 | 1 days supply | Qty: 1 | Fill #0

## 2019-02-06 MED FILL — POTASSIUM CL ER 20 MEQ TABL: 20 | 3 days supply | Qty: 3 | Fill #0

## 2019-02-06 MED FILL — MAGNESIUM OXIDE 400 MG TABS: 400 | 3 days supply | Qty: 6 | Fill #0

## 2019-02-06 MED FILL — ASPIRIN LOW DOSE 81 MG TBEC: 81 | 90 days supply | Qty: 90 | Fill #0

## 2019-02-06 NOTE — TOC Transition Note (Signed)
Transition of Care [facility]) - CM/SW Discharge Note   Patient Details  Name: Sonya Small MRN: [id number] Date of Birth: November 11, 1950  Transition of Care [facility]) CM/SW Contact:  Zenon Mayo, RN Phone Number: 02/06/2019, 1:28 PM   Clinical Narrative:    From home with sister, NCM offered choice, she chose [facility] referral given to [facility] for HHPT, soc will begin 24 to 48 hrs post dc  She has no problem getting medications, she has transportation. She will also need a rollator, Zack notified with [employer] to bring up rollator and also [place] notified with [facility] for [employer] .      Final next level of care: [place] Barriers to Discharge: No Barriers Identified   Patient Goals and CMS Choice Patient states their goals for this hospitalization and ongoing recovery are:: get better CMS Medicare.gov Compare Post Acute Care list provided to:: Patient Choice offered to / list presented to : Patient  Discharge Placement                       Discharge Plan and Services In-house Referral: NA Discharge Planning Services: CM Consult Post Acute Care Choice: Durable Medical Equipment, Home Health          DME Arranged: Walker rolling with seat DME Agency: [place] Date DME Agency Contacted: 02/06/19 Time DME Agency Contacted: [phone] Representative spoke with at DME Agency: [place]: PT [place]: May Date [place]: 02/03/19 Time [place]: [phone] Representative spoke with at [place]: [place] ([place]) Interventions     Readmission Risk Interventions No flowsheet data found.

## 2019-02-06 NOTE — Discharge Summary (Signed)
. Physician Discharge Summary  Sonya Small JAS:[id number] DOB: 10/25/1950 DOA: 01/31/2019  PCP: [name], MD  Admit date: 01/31/2019 Discharge date: 02/06/2019  Admitted From: Home Disposition:  To home with HHPT  Recommendations for Outpatient Follow-up:  1. Follow up with PCP in 1-2 weeks 2. Please obtain BMP/CBC in one week  Home Health: Yes  Equipment/Devices: Rolling walker   Discharge Condition: Stable  CODE STATUS: FULL    Brief/Interim Summary: 68 year old female, current every day smoker, COPD with multiple co morbidities, told her family she felt short of breath this morning and suddenly collapsed. There is limited data from the family and the patient was unresponsive on arrival to [facility]. At the time she was being bagged with a BVM and patient had a Glasgow Coma Scale of 3. She required emergent intubation in the ED. She was given 2 mg of Narcan in the field with no response. Urine drug screen was negative, patient was mottled on arrival to [facility] ED. Imaging revealed pneumonia. Pt, was stabilized and transferred to [facility] for further care. PCCM were asked to admit and manage patient upon arrival.  Transferred to [facility] on 5/12. Now extubated, being treated for pneumonia.  1. Acute on Chronic Respiratory Failure with Hypoxemia  Pneumonia     - CAP RLL; COVID negative (confirmed in [facility] records)     - COPD on home O2 at 2 L - have gotten conflicting reports, pt notes she has been on this at night only -> per sister, she doesn't use oxygen at home (she did have a cpap, but she apparently gave this away)     - Current every day smoker; counseled against further use     - CXR from 5/10 with right lower lobe pneumonia vs aspiration pneumonitis     - IS/flutter valve     - Ceftriaxone/azithromycin (5/9-5/10) -> transitioned to levoquin on 5/11 by PCCM - plan for 7 days abx (END 5/15)     - Breo ellipta has been restarted     - add LAMA at discharge      - Apparently per PCCM notes, "states that she is supposed to be on Breo, only takes occasionally. She also has Advair available. She does not recognize [name] (which mayalso on her medicine list)"     - Blood and urine cx from [place]. Cx here, NGTD.  2. Hypertension  Hypertensive Urgency     - Increased blood pressure since extubated 5/10     - Restarted lisinopril, metoprolol 5/10     - Start home Lasix 5/11      - amlodipine 5 mg, lisinopril to 20 mg; increase amlodipine to 10 at discharge  3. Acute renal insufficiency (S Cr 0.57 04/2018)     - Creatinine peaked at 1.15; resolved  4. History of CAD, stroke     - ASA, Plavix, fenofibrate, rosuvastatin  5. DM     - Sliding-scale insulin as per protocol     - A1c 7.6     - metformin ER at discharge; follow up with PCP  6. Allergic rhinitis     - Fluticasone nasal spray, Singulair  7. Urinary retention     - Restart Myrbetriq, vesicare  8. Acute multifactorial encephalopathy, improved.      - CT Head negative for Acute intracranial findings  9. Depression/Chronic pain     - Minimize sedating meds     - Restart Zoloft, Lyrica     -  Continue tramadol for now,try to defer narcotics, relaxers, Xanax if possible  10. Headache     - chronic, continue to monitor, treat with apap as needed.   11. Hypokalemia/hypomagnesemia     - K-DUR 87mEq, Mag-Ox 400mg ; follow labs  12. Stable R adrenal mass     - on [place] imaging, considered benign, follow outpt  13. Debility/Gait disturbance     - working with PT     - per PT: Ambulation/Gait assistance: Supervision; Gait Distance (Feet): 200 Feet Assistive device: Straight cane;4-wheeled walker; Gait Pattern/deviations: Step-through pattern;Decreased stride length; Gait velocity: decr; Gait velocity interpretation: 1.31 - 2.62 ft/sec, indicative of limited community ambulator General Gait Details: Slightly unsteady gait with cane. Improved stability with rollator.  Amb on RA with SpO2 97% after amb  Discharge Diagnoses:  Active Problems:   Acute respiratory failure ([place])   Community acquired pneumonia   Pneumonia    Discharge Instructions   Allergies as of 02/06/2019   No Known Allergies     Medication List    STOP taking these medications   ALPRAZolam 0.5 MG tablet Commonly known as:  XANAX   Fluticasone-Salmeterol 250-50 MCG/DOSE Aepb Commonly known as:  ADVAIR   GOODY HEADACHE PO   ipratropium-albuterol 0.5-2.5 (3) MG/3ML Soln Commonly known as:  DUONEB   nystatin 100000 UNIT/ML suspension Commonly known as:  MYCOSTATIN   oxyCODONE-acetaminophen 10-325 MG tablet Commonly known as:  PERCOCET   valACYclovir 500 MG tablet Commonly known as:  VALTREX     TAKE these medications   albuterol 108 (90 Base) MCG/ACT inhaler Commonly known as:  VENTOLIN HFA Inhale 2 puffs into the lungs every 4 (four) hours as needed for wheezing or shortness of breath.   amLODipine 5 MG tablet Commonly known as:  NORVASC Take 2 tablets (10 mg total) by mouth daily.   aspirin 81 MG EC tablet Take 1 tablet (81 mg total) by mouth daily. Start taking on:  Feb 07, 2019 What changed:  additional instructions   clopidogrel 75 MG tablet Commonly known as:  PLAVIX Take 1 tablet (75 mg total) by mouth daily.   diclofenac sodium 1 % Gel Commonly known as:  VOLTAREN Apply 2 g topically daily as needed (mild pain).   fenofibrate 160 MG tablet Take 160 mg by mouth daily.   fluticasone 50 MCG/ACT nasal spray Commonly known as:  FLONASE Place 1 spray into both nostrils daily.   Fluticasone-Umeclidin-Vilant 100-62.5-25 MCG/INH Aepb Commonly known as:  Trelegy Ellipta Inhale 1 puff into the lungs daily for 30 days.   furosemide 20 MG tablet Commonly known as:  LASIX Take 20 mg by mouth 2 (two) times daily.   levofloxacin 500 MG tablet Commonly known as:  LEVAQUIN Take 1 tablet (500 mg total) by mouth daily for 1 day.   lisinopril 20 MG  tablet Commonly known as:  ZESTRIL Take 1 tablet (20 mg total) by mouth daily. Start taking on:  Feb 07, 2019 What changed:    medication strength  how much to take   magnesium oxide 400 (241.3 Mg) MG tablet Commonly known as:  MAG-OX Take 1 tablet (400 mg total) by mouth 2 (two) times daily for 3 days.   metFORMIN 750 MG 24 hr tablet Commonly known as:  GLUCOPHAGE-XR Take 1 tablet (750 mg total) by mouth daily with breakfast for 30 days.   metoprolol tartrate 50 MG tablet Commonly known as:  LOPRESSOR Take 25 mg by mouth daily.   Myrbetriq 50 MG Tb24  tablet Generic drug:  mirabegron ER Take 50 mg by mouth daily.   NexIUM 40 MG capsule Generic drug:  esomeprazole Take 1 capsule (40 mg total) by mouth daily at 12 noon.   nitroGLYCERIN 0.4 MG SL tablet Commonly known as:  NITROSTAT Place 1 tablet (0.4 mg total) under the tongue every 5 (five) minutes as needed.   potassium chloride SA 20 MEQ tablet Commonly known as:  K-DUR Take 1 tablet (20 mEq total) by mouth daily for 3 days.   pregabalin 50 MG capsule Commonly known as:  LYRICA Take 1 capsule (50 mg total) by mouth 2 (two) times daily for 30 days. What changed:  when to take this   rosuvastatin 20 MG tablet Commonly known as:  CRESTOR Take 1 tablet (20 mg total) by mouth daily.   sertraline 100 MG tablet Commonly known as:  ZOLOFT Take 2 tablets (200 mg total) by mouth daily for 30 days. Start taking on:  Feb 07, 2019 What changed:  how much to take   tiZANidine 4 MG tablet Commonly known as:  ZANAFLEX Take 4 mg by mouth 2 (two) times daily as needed for muscle spasms.   traMADol 50 MG tablet Commonly known as:  ULTRAM Take 1 tablet (50 mg total) by mouth every 6 (six) hours as needed for up to 5 days for moderate pain or severe pain.   Vascepa 1 g Caps Generic drug:  Icosapent Ethyl Take 1-2 capsules by mouth See admin instructions. 2 capsules and 1 capsule at night   VESIcare 10 MG tablet Generic  drug:  solifenacin Take 1 tablet (10 mg total) by mouth daily for 30 days. What changed:  how much to take            Durable Medical Equipment  (From admission, onward)         Start     Ordered   02/06/19 0516  For home use only DME 4 wheeled rolling walker with seat  Once    Question Answer Comment  Patient needs a walker to treat with the following condition Debility   Patient needs a walker to treat with the following condition Gait disturbance      02/06/19 0518         Follow-up Information    [facility] Follow up.   Specialty:  Home Health Services Why:  HHPT Contact information: [place] [address] [phone]        [place] Follow up.   Why:  rollator         No Known Allergies  Consultations:  PCCM   Procedures/Studies: Dg Chest Port 1 View  Result Date: 02/01/2019 CLINICAL DATA:  pneumoniapneumonia EXAM: PORTABLE CHEST 1 VIEW COMPARISON:  01/31/2019, CT, chest x-ray FINDINGS: Endotracheal tube and central venous line unchanged. NG tube extends the stomach. Stable cardiac silhouette. Airspace disease in the RIGHT lower lobe. Low lung volumes. IMPRESSION: 1. Airspace disease in the RIGHT lower lobe representing pneumonia or aspiration pneumonitis. Findings similar to CT 1 day prior. 2. Stable support apparatus. Electronically Signed   By: Suzy Bouchard M.D.   On: 02/01/2019 04:55   Dg Chest Port 1 View  Result Date: 01/31/2019 CLINICAL DATA:  Pneumonia.  Endotracheal tube evaluation. EXAM: PORTABLE CHEST 1 VIEW COMPARISON:  01/31/2019 chest radiograph FINDINGS: An endotracheal tube is identified with tip 2.5 cm above the carina. A LEFT IJ central venous catheter with tip overlying the UPPER SVC and  NG tube entering the stomach with tip off the field of view again noted. RIGHT lung airspace opacities appear slightly improved. Mild LEFT LOWER lung airspace disease/atelectasis again noted. There is  no evidence of pneumothorax. IMPRESSION: Apparent improvement in RIGHT lung airspace disease. Otherwise unchanged appearance of the chest. Electronically Signed   By: Margarette Canada M.D.   On: 01/31/2019 14:08      Subjective: "I feel better today!"  Discharge Exam: Vitals:   02/06/19 0749 02/06/19 0833  BP: (!) 165/105   Pulse: 99 93  Resp: 19 17  Temp: 98.6 F (37 C)   SpO2: 96% 94%   Vitals:   02/05/19 2343 02/06/19 0600 02/06/19 0749 02/06/19 0833  BP: (!) 172/86  (!) 165/105   Pulse: 90  99 93  Resp: 16  19 17   Temp: 99 F (37.2 C)  98.6 F (37 C)   TempSrc: Oral  Oral   SpO2: 95%  96% 94%  Weight:  81.6 kg    Height:        General:67 y.o.femaleresting in bed in NAD Cardiovascular: RRR, +S1, S2, no m/g/r, equal pulses throughout Respiratory: CTABL, no w/r/r, normal WOB GI: BS+, NDNT, no masses noted, no organomegaly noted MSK: No e/c/c Skin: No rashes, bruises, ulcerations noted Neuro: A&O x 3, no focal deficits    The results of significant diagnostics from this hospitalization (including imaging, microbiology, ancillary and laboratory) are listed below for reference.     Microbiology: Recent Results (from the past 240 hour(s))  MRSA PCR Screening     Status: None   Collection Time: 01/31/19  1:34 PM  Result Value Ref Range Status   MRSA by PCR NEGATIVE NEGATIVE Final    Comment:        The GeneXpert MRSA Assay (FDA approved for NASAL specimens only), is one component of a comprehensive MRSA colonization surveillance program. It is not intended to diagnose MRSA infection nor to guide or monitor treatment for MRSA infections. Performed at [facility], [place] [address] [place], [place] [address]   Culture, blood (routine x 2)     Status: None   Collection Time: 01/31/19  4:00 PM  Result Value Ref Range Status   Specimen Description BLOOD LEFT ARM  Final   Special Requests   Final    BOTTLES DRAWN AEROBIC AND ANAEROBIC Blood Culture  adequate volume   Culture   Final    NO GROWTH 5 DAYS Performed at [facility], [place] [address] [place], [place] [address]    Report Status 02/05/2019 FINAL  Final  Culture, blood (routine x 2)     Status: None   Collection Time: 01/31/19  4:05 PM  Result Value Ref Range Status   Specimen Description BLOOD RIGHT ANTECUBITAL  Final   Special Requests AEROBIC BOTTLE ONLY Blood Culture adequate volume  Final   Culture   Final    NO GROWTH 5 DAYS Performed at [facility], [place] [address], [place], [place] [address]    Report Status 02/05/2019 FINAL  Final     Labs: BNP (last 3 results) Recent Labs    01/31/19 1547  BNP 767.2*   Basic Metabolic Panel: Recent Labs  Lab 01/31/19 1547  02/02/19 0757 02/03/19 1038 02/04/19 0503 02/05/19 0435 02/05/19 1324 02/06/19 0432  NA 143   < > 144 142 141 140 140 139  K 4.5   < > 3.9 3.1* 3.6 2.8* 3.1* 3.1*  CL 112*   < >  109 105 105 102 104 105  CO2 22   < > 27 31 26 24 24 23   GLUCOSE 164*   < > 152* 189* 173* 195* 206* 165*  BUN 15   < > 23 15 15 18 16 19   CREATININE 1.14*   < > 0.84 0.83 0.82 0.87 0.73 0.76  CALCIUM 8.0*   < > 8.7* 8.7* 8.6* 9.0 8.8* 8.9  MG 1.8   < > 1.7  --  1.5* 1.3* 1.5* 1.4*  PHOS 4.1  --   --   --   --  2.6  --  3.1   < > = values in this interval not displayed.   Liver Function Tests: Recent Labs  Lab 01/31/19 1547 02/02/19 0757 02/04/19 0503 02/05/19 0435 02/06/19 0432  AST 51* 22 21  --   --   ALT 36 22 22  --   --   ALKPHOS 65 49 55  --   --   BILITOT 0.5 0.6 0.8  --   --   PROT 5.8* 5.7* 6.0*  --   --   ALBUMIN 3.1* 2.8* 2.8* 3.1* 2.9*   No results for input(s): LIPASE, AMYLASE in the last 168 hours. No results for input(s): AMMONIA in the last 168 hours. CBC: Recent Labs  Lab 01/31/19 1547  02/02/19 0757 02/03/19 1038 02/04/19 0503 02/05/19 0435 02/06/19 0432  WBC 14.7*   < > 13.0* 13.1* 9.3 11.9* 11.6*  NEUTROABS 12.6*  --   --   --   --  8.4* 7.7  HGB 14.5   < >  12.1 13.1 12.5 13.8 13.8  HCT 46.9*   < > 38.8 41.2 39.3 42.8 42.4  MCV 90.0   < > 91.1 89.8 88.5 85.8 86.2  PLT 247   < > 182 214 183 265 255   < > = values in this interval not displayed.   Cardiac Enzymes: No results for input(s): CKTOTAL, CKMB, CKMBINDEX, TROPONINI in the last 168 hours. BNP: Invalid input(s): POCBNP CBG: Recent Labs  Lab 02/05/19 1951 02/05/19 2326 02/06/19 0310 02/06/19 0745 02/06/19 1203  GLUCAP 233* 177* 152* 201* 207*   D-Dimer No results for input(s): DDIMER in the last 72 hours. Hgb A1c No results for input(s): HGBA1C in the last 72 hours. Lipid Profile No results for input(s): CHOL, HDL, LDLCALC, TRIG, CHOLHDL, LDLDIRECT in the last 72 hours. Thyroid function studies No results for input(s): TSH, T4TOTAL, T3FREE, THYROIDAB in the last 72 hours.  Invalid input(s): FREET3 Anemia work up No results for input(s): VITAMINB12, FOLATE, FERRITIN, TIBC, IRON, RETICCTPCT in the last 72 hours. Urinalysis    Component Value Date/Time   COLORURINE YELLOW 07/12/2014 1947   APPEARANCEUR CLOUDY (A) 07/12/2014 1947   LABSPEC 1.027 07/12/2014 1947   PHURINE 5.0 07/12/2014 1947   GLUCOSEU NEGATIVE 07/12/2014 1947   HGBUR NEGATIVE 07/12/2014 1947   BILIRUBINUR SMALL (A) 07/12/2014 1947   KETONESUR NEGATIVE 07/12/2014 1947   PROTEINUR NEGATIVE 07/12/2014 1947   UROBILINOGEN 1.0 07/12/2014 1947   NITRITE NEGATIVE 07/12/2014 1947   LEUKOCYTESUR SMALL (A) 07/12/2014 1947   Sepsis Labs Invalid input(s): PROCALCITONIN,  WBC,  LACTICIDVEN Microbiology Recent Results (from the past 240 hour(s))  MRSA PCR Screening     Status: None   Collection Time: 01/31/19  1:34 PM  Result Value Ref Range Status   MRSA by PCR NEGATIVE NEGATIVE Final    Comment:        The GeneXpert MRSA Assay (FDA  approved for NASAL specimens only), is one component of a comprehensive MRSA colonization surveillance program. It is not intended to diagnose MRSA infection nor to guide  or monitor treatment for MRSA infections. Performed at [facility], [place] [address], [place], [place] [address]   Culture, blood (routine x 2)     Status: None   Collection Time: 01/31/19  4:00 PM  Result Value Ref Range Status   Specimen Description BLOOD LEFT ARM  Final   Special Requests   Final    BOTTLES DRAWN AEROBIC AND ANAEROBIC Blood Culture adequate volume   Culture   Final    NO GROWTH 5 DAYS Performed at [facility], [place] [address], [place], [place] [address]    Report Status 02/05/2019 FINAL  Final  Culture, blood (routine x 2)     Status: None   Collection Time: 01/31/19  4:05 PM  Result Value Ref Range Status   Specimen Description BLOOD RIGHT ANTECUBITAL  Final   Special Requests AEROBIC BOTTLE ONLY Blood Culture adequate volume  Final   Culture   Final    NO GROWTH 5 DAYS Performed at [facility], [place] [address] [place], [place] [address]    Report Status 02/05/2019 FINAL  Final     Time coordinating discharge: 45 minutes spent in the coordination of this discharge today.  SIGNED:   Jonnie Finner, DO  [facility] 02/06/2019, 1:34 PM Pager   If 7PM-7AM, please contact night-coverage [email or web address] Password [email or web address]

## 2019-02-06 NOTE — Progress Notes (Signed)
Inpatient Diabetes Program Recommendations  AACE/ADA: New Consensus Statement on Inpatient Glycemic Control   Target Ranges:  Prepandial:   less than 140 mg/dL      Peak postprandial:   less than 180 mg/dL (1-2 hours)      Critically ill patients:  140 - 180 mg/dL   Results for Sonya Small, Sonya Small (MRN [id number]) as of 02/06/2019 08:13  Ref. Range 02/05/2019 07:31 02/05/2019 11:21 02/05/2019 15:28 02/05/2019 19:51 02/05/2019 23:26 02/06/2019 03:10 02/06/2019 07:45  Glucose-Capillary Latest Ref Range: 70 - 99 mg/dL 183 (H) 199 (H) 207 (H) 233 (H) 177 (H) 152 (H) 201 (H)   Review of Glycemic Control  Current orders for Inpatient glycemic control: Novolog 0-15 units TID with meals, Novolog 0-5 units QHS  Inpatient Diabetes Program Recommendations:   Insulin - Meal Coverage: Please consider ordering Novolog 3 units TID with meals for meal coverage if patient eats at least 50% of meals.  Thanks, Barnie Alderman, RN, MSN, CDE Diabetes Coordinator Inpatient Diabetes Program [phone] (Team Pager from 8am to 5pm)

## 2019-02-06 NOTE — Care Management Important Message (Signed)
Important Message  Patient Details  Name: Sonya Small MRN: [id number] Date of Birth: 05/24/1951   Medicare Important Message Given:  Yes    Orbie Pyo 02/06/2019, 1:55 PM

## 2019-05-07 ENCOUNTER — Telehealth: Payer: Self-pay | Admitting: Physician Assistant

## 2019-05-07 NOTE — Telephone Encounter (Signed)
Called patient, she states that her sister is her caregiver and helps her at home with her medications and things, she would like for her to be able to come with her to the appointment. Patient seemed slightly confused on the phone with me, having someone seemed like a good idea.  Advised her it was okay, but would route to PA to make aware, if any issues we would let them know.

## 2019-05-07 NOTE — Telephone Encounter (Signed)
New Message    Patient would like to bring someone to help her at the appointment on 9/3.  Please call patient to advise.

## 2019-05-12 NOTE — Telephone Encounter (Signed)
Yes, it is fine. I would be happy to have her sister join the visit

## 2019-05-13 NOTE — Telephone Encounter (Signed)
Noted! Thank you

## 2019-05-25 ENCOUNTER — Telehealth: Payer: Self-pay | Admitting: Physician Assistant

## 2019-05-25 NOTE — Telephone Encounter (Signed)
° ° °  Pt c/o swelling: STAT is pt has developed SOB within 24 hours  1) How much weight have you gained and in what time span? N/A  2) If swelling, where is the swelling located? LEGS, FEET  3) Are you currently taking a fluid pill? UNSURE  4) Are you currently SOB? NO  5) Do you have a log of your daily weights (if so, list)? N/A  6) Have you gained 3 pounds in a day or 5 pounds in a week? N/A  7) Have you traveled recently? NO

## 2019-05-25 NOTE — Telephone Encounter (Signed)
Pt does not have a scale. Pt states that this happens daily she is SOB exertional. She takes lasix 20mg  BID. She will get a scale and start to track weight daily. She will take extra lasix with tonight's lasix dose (40mg ) and then again in the am (40mg ). She will CB if additional sx or SOB worsens. She will go to the ER if SOB worsens. Verbalizes understanding.

## 2019-05-28 ENCOUNTER — Other Ambulatory Visit: Payer: Self-pay

## 2019-05-28 ENCOUNTER — Ambulatory Visit (INDEPENDENT_AMBULATORY_CARE_PROVIDER_SITE_OTHER): Payer: Medicare Other | Admitting: Physician Assistant

## 2019-05-28 ENCOUNTER — Encounter: Payer: Self-pay | Admitting: Physician Assistant

## 2019-05-28 VITALS — BP 124/77 | HR 64 | Temp 97.2°F | Ht 66.0 in | Wt 179.8 lb

## 2019-05-28 DIAGNOSIS — J449 Chronic obstructive pulmonary disease, unspecified: Secondary | ICD-10-CM | POA: Diagnosis not present

## 2019-05-28 DIAGNOSIS — I10 Essential (primary) hypertension: Secondary | ICD-10-CM | POA: Diagnosis not present

## 2019-05-28 DIAGNOSIS — I25119 Atherosclerotic heart disease of native coronary artery with unspecified angina pectoris: Secondary | ICD-10-CM

## 2019-05-28 DIAGNOSIS — E785 Hyperlipidemia, unspecified: Secondary | ICD-10-CM

## 2019-05-28 DIAGNOSIS — I2510 Atherosclerotic heart disease of native coronary artery without angina pectoris: Secondary | ICD-10-CM

## 2019-05-28 DIAGNOSIS — I5032 Chronic diastolic (congestive) heart failure: Secondary | ICD-10-CM

## 2019-05-28 DIAGNOSIS — I739 Peripheral vascular disease, unspecified: Secondary | ICD-10-CM

## 2019-05-28 DIAGNOSIS — Z8673 Personal history of transient ischemic attack (TIA), and cerebral infarction without residual deficits: Secondary | ICD-10-CM

## 2019-05-28 NOTE — Patient Instructions (Signed)
Medication Instructions:  Sonya Deforest, PA recommends that you continue on your current medications as directed. Please refer to the Current Medication list given to you today.  If you need a refill on your cardiac medications before your next appointment, please call your pharmacy.   Follow-Up: At [facility], you and your health needs are our priority.  As part of our continuing mission to provide you with exceptional heart care, we have created designated Provider Care Teams.  These Care Teams include your primary Cardiologist (physician) and Advanced Practice Providers (APPs -  Physician Assistants and Nurse Practitioners) who all work together to provide you with the care you need, when you need it. You will need a follow up appointment in 6 months.  Please call our office 2 months in advance to schedule this appointment.  You may see [name] [name], MD or one of the following Advanced Practice Providers on your designated Care Team: [place], [place] . Fabian Sharp, PA-C . You will receive a reminder letter in the mail two months in advance. If you don't receive a letter, please call our office to schedule the follow-up appointment.

## 2019-05-28 NOTE — Progress Notes (Signed)
Cardiology Office Note    Date:  05/29/2019   ID:  Sonya Small, Sonya Small 1951/05/09, MRN [id number]  PCP:  [name], MD  Cardiologist:  Dr. [name]   Chief Complaint  Patient presents with  . other    6 mo f/u. Medications reviewed verbally.    History of Present Illness:  Sonya Small is a 68 y.o. female with PMH of HTN, HLD, OSA, PAD, CVA, chronic diastolic heart failureand CAD. Patient had a NSTEMI in March 2010 and underwent DES to left circumflex.She had anotherNSTEMI in May 2011 secondary to thrombotic RCA lesion, cardiac catheterization showed nonobstructive disease, medical therapy was recommended.She was admitted for acute CVA in October 2015 after presenting with slurred speech and facial drooping. MRI showed acute right MCA infarct involving basal ganglia and periventricular white matter. She was started on aspirin and Plavix.ABI obtained in July 2018 was normal. Myoview obtained on 03/25/2018 showed EF 52%, small sized moderate intensity fixed apical perfusion defect likely attenuation artifact, no reversible ischemia was seen.  Overall this was a low risk study.   I last saw the patient in September 2019, there was a lot of medication discrepancies.  I suspect her dizziness was likely related to polypharmacy use.  Carotid Doppler was repeated which was stable.  She was last seen by Dr. [name] in December 2019, and lisinopril was further reduced to 5 mg daily.  Patient was admitted in May 2021 with shortness of breath and the sudden collapse.  She was unresponsive on arrival to [facility].  Her Glasgow Coma Scale was 3 on arrival.  She required emergent intubation and was given 2 mg of Narcan in the field with no response.  Urine drug test was negative.  Imaging revealed a pneumonia.  Patient was transferred to [facility] and was treated for right lower lobe pneumonia.  COVID-19 test was negative.  Her Xanax and Percocet was stopped during the admission.   Recently, she has been having some leg swelling and was instructed to double up on the Lasix for a few days.  She presents today for cardiology office evaluation.  On physical exam, she does not have any significant lower extremity edema, orthopnea or PND.  She continued to have some shortness of breath with exertion and has been smoking.  We discussed again the importance of tobacco cessation.  The only time she has some chest tightness is if she overexerted herself.  This is likely related to her history of COPD.  She is aware that if she started having increasing frequency of chest pain or any chest pain at rest, she will need further evaluation.  Otherwise, I would recommend continued observation.  Her functional status is very poor.  Otherwise, she denies any sign of reinfection.  She does have a productive cough in the morning, this was likely related to her history of smoking.    Past Medical History:  Diagnosis Date  . Anxiety   . CAD (coronary artery disease)    a. NSTEMI 11/2008 s/p DES to LCx (3.0x12 Xience); b. NSTEMI 01/2010 secondary to thrombotic RCA lesion (non-obstructive)-->med rx (integrilin x 24 hrs + plavix); c. 09/2012 negative Myoview.  . Chronic diastolic CHF (congestive heart failure) ([place])    a. 06/2014 Echo: EF 55-60%, no rwma, Gr1 DD, mild AI.  [name] Depression   . Dizziness   . GERD (gastroesophageal reflux disease)   . Headache   . Hyperlipidemia   . Hypertensive heart disease   .  Lumbar disc disease   . Myocardial infarction ([place])   . Obstructive sleep apnea   . OP (osteoporosis)   . Overweight(278.02)   . PAD (peripheral artery disease) ([place])    a. Emboli to R foot 2010 from partially occlusive lesion in R EIA, s/p stenting. - followed by Dr. Donnetta Hutching;  b. 10/2015 ABIs: R 1.03, L 0.97.  [name] Restless leg   . Stroke ([place])   . TIA (transient ischemic attack)   . Tobacco abuse     Past Surgical History:  Procedure Laterality Date  . EYE SURGERY     at age 57  .  hysterectomy -age 65    . ILIAC ARTERY STENT     RIGHT ILIAC STENT  . KNEE ARTHROSCOPY    . LUMBAR LAMINECTOMY    . TUBAL LIGATION      Current Medications: Outpatient Medications Prior to Visit  Medication Sig Dispense Refill  . albuterol (PROVENTIL HFA;VENTOLIN HFA) 108 (90 BASE) MCG/ACT inhaler Inhale 2 puffs into the lungs every 4 (four) hours as needed for wheezing or shortness of breath.    [name] amLODipine (NORVASC) 10 MG tablet Take 10 mg by mouth daily.    . clopidogrel (PLAVIX) 75 MG tablet Take 1 tablet (75 mg total) by mouth daily. 90 tablet 0  . diclofenac sodium (VOLTAREN) 1 % GEL Apply 2 g topically daily as needed (mild pain).    . fenofibrate 160 MG tablet Take 160 mg by mouth daily.    . fluticasone (FLONASE) 50 MCG/ACT nasal spray Place 1 spray into both nostrils daily.    . furosemide (LASIX) 20 MG tablet Take 20 mg by mouth 2 (two) times daily.    [name] lisinopril (ZESTRIL) 5 MG tablet Take 5 mg by mouth daily.    . metoprolol tartrate (LOPRESSOR) 50 MG tablet Take 25 mg by mouth daily.     [name] NEXIUM 40 MG capsule Take 1 capsule (40 mg total) by mouth daily at 12 noon. 30 capsule 6  . nitroGLYCERIN (NITROSTAT) 0.4 MG SL tablet Place 1 tablet (0.4 mg total) under the tongue every 5 (five) minutes as needed. 25 tablet 11  . rosuvastatin (CRESTOR) 20 MG tablet Take 1 tablet (20 mg total) by mouth daily. 30 tablet 1  . tiZANidine (ZANAFLEX) 4 MG tablet Take 4 mg by mouth 2 (two) times daily as needed for muscle spasms.     [name] VASCEPA 1 g CAPS Take 1-2 capsules by mouth See admin instructions. 2 capsules and 1 capsule at night    . venlafaxine (EFFEXOR) 75 MG tablet Take 150 mg by mouth daily.    [name] amLODipine (NORVASC) 5 MG tablet Take 2 tablets (10 mg total) by mouth daily. 180 tablet 0  . mirabegron ER (MYRBETRIQ) 50 MG TB24 tablet Take 50 mg by mouth daily.    . potassium chloride SA (K-DUR) 20 MEQ tablet Take 1 tablet (20 mEq total) by mouth daily for 3 days. 3 tablet 0  .  pregabalin (LYRICA) 50 MG capsule Take 1 capsule (50 mg total) by mouth 2 (two) times daily for 30 days. 60 capsule 0  . sertraline (ZOLOFT) 100 MG tablet Take 2 tablets (200 mg total) by mouth daily for 30 days. 60 tablet 0  . lisinopril (ZESTRIL) 20 MG tablet Take 1 tablet (20 mg total) by mouth daily. 90 tablet 0  . metFORMIN (GLUCOPHAGE-XR) 750 MG 24 hr tablet Take 1 tablet (750 mg total) by mouth daily with breakfast for  30 days. 30 tablet 0   No facility-administered medications prior to visit.      Allergies:   Patient has no known allergies.   Social History   Socioeconomic History  . Marital status: Legally Separated    Spouse name: Not on file  . Number of children: 3  . Years of education: 10 th  . Highest education level: Not on file  Occupational History  . Occupation: DISABLED    Employer: UNEMPLOYED  Social Needs  . Financial resource strain: Not on file  . Food insecurity    Worry: Not on file    Inability: Not on file  . Transportation needs    Medical: Not on file    Non-medical: Not on file  Tobacco Use  . Smoking status: Former Smoker    Types: Cigarettes    Quit date: 11/20/2012    Years since quitting: 6.5  . Smokeless tobacco: Never Used  Substance and Sexual Activity  . Alcohol use: No    Alcohol/week: 0.0 standard drinks  . Drug use: No  . Sexual activity: Never  Lifestyle  . Physical activity    Days per week: Not on file    Minutes per session: Not on file  . Stress: Not on file  Relationships  . Social Herbalist on phone: Not on file    Gets together: Not on file    Attends religious service: Not on file    Active member of club or organization: Not on file    Attends meetings of clubs or organizations: Not on file    Relationship status: Not on file  Other Topics Concern  . Not on file  Social History Narrative   Patient is single with 3 children, 1 deceased.   Patient is right handed.   Patient has 10 th grade  education.   Patient drinks 5 or more cups daily.     Family History:  The patient's family history includes Alzheimer's disease in her mother; Cancer in her brother; Depression in her brother; Diabetes in her brother; Heart disease in an other family member; Hepatitis C in her brother; Hypertension in her brother; Thyroid disease in her brother.   ROS:   Please see the history of present illness.    ROS All other systems reviewed and are negative.   PHYSICAL EXAM:   VS:  BP 124/77 (BP Location: Left Arm, Patient Position: Lying right side, Cuff Size: Normal)   Pulse 64   Temp (!) 97.2 F (36.2 C) (Temporal)   Ht 5\' 6"  (1.676 m)   Wt 179 lb 12.8 oz (81.6 kg)   SpO2 97%   BMI 29.02 kg/m    GEN: Well nourished, well developed, in no acute distress  HEENT: normal  Neck: no JVD, carotid bruits, or masses Cardiac: RRR; no murmurs, rubs, or gallops,no edema  Respiratory:  clear to auscultation bilaterally, normal work of breathing GI: soft, nontender, nondistended, + BS MS: no deformity or atrophy  Skin: warm and dry, no rash Neuro:  Alert and Oriented x 3, Strength and sensation are intact Psych: euthymic mood, full affect  Wt Readings from Last 3 Encounters:  05/28/19 179 lb 12.8 oz (81.6 kg)  02/06/19 179 lb 14.3 oz (81.6 kg)  08/25/18 182 lb 6.4 oz (82.7 kg)      Studies/Labs Reviewed:   EKG:  EKG is ordered today.  The ekg ordered today demonstrates normal sinus rhythm, poor R wave progression anterior leads,  nonspecific T wave changes  Recent Labs: 01/31/2019: B Natriuretic Peptide 299.1 02/04/2019: ALT 22 02/06/2019: BUN 20; Creatinine, Ser 0.71; Hemoglobin 13.8; Magnesium 1.4; Platelets 255; Potassium 4.4; Sodium 143   Lipid Panel    Component Value Date/Time   CHOL 179 07/13/2014 0108   TRIG 212 (H) 07/13/2014 0108   HDL 36 (L) 07/13/2014 0108   CHOLHDL 5.0 07/13/2014 0108   VLDL 42 (H) 07/13/2014 0108   LDLCALC 101 (H) 07/13/2014 0108    Additional studies/  records that were reviewed today include:   Echo 07/13/2014 LV EF: 55% -  60%  Study Conclusions   - Left ventricle: The cavity size was normal. Systolic function was  normal. The estimated ejection fraction was in the range of 55%  to 60%. Wall motion was normal; there were no regional wall  motion abnormalities. Doppler parameters are consistent with  abnormal left ventricular relaxation (grade 1 diastolic  dysfunction).  - Aortic valve: There was mild regurgitation.    Myoview 03/25/2018 Study Highlights    The left ventricular ejection fraction is mildly decreased (45-54%).  Nuclear stress EF: 52%.  No T wave inversion was noted during stress.  There was no ST segment deviation noted during stress.  Defect 1: There is a small defect of moderate severity.  This is a low risk study.   Small size, moderate intensity fixed apical perfusion defect, likely attenuation artifact. No reversible ischemia. LVEF 52% with normal wall motion. This is a low risk study.     ASSESSMENT:    1. Coronary artery disease involving native coronary artery of native heart with angina pectoris ([place])   2. Chronic obstructive pulmonary disease, unspecified COPD type ([place])   3. Essential hypertension   4. Hyperlipidemia, unspecified hyperlipidemia type   5. Chronic diastolic heart failure ([place])   6. H/O: CVA (cerebrovascular accident)      PLAN:  In order of problems listed above:  1. CAD: Patient does have chest discomfort with extreme exertion but not with everyday activity.  If this is likely related to shortness of breath secondary to COPD.  Myoview obtained on 03/25/2018 was stable.  If there is any increase in the frequency of chest pain or duration, patient has been instructed to contact cardiology.  Continue Plavix  2. COPD: Tobacco cessation strongly advised  3. Hypertension: Blood pressure stable  4. Hyperlipidemia: Continue current statin therapy  5. Chronic  diastolic heart failure: Euvolemic on physical exam  6. History of CVA: No recent recurrence.    Medication Adjustments/Labs and Tests Ordered: Current medicines are reviewed at length with the patient today.  Concerns regarding medicines are outlined above.  Medication changes, Labs and Tests ordered today are listed in the Patient Instructions below. Patient Instructions  Medication Instructions:  Almyra Deforest, PA recommends that you continue on your current medications as directed. Please refer to the Current Medication list given to you today.  If you need a refill on your cardiac medications before your next appointment, please call your pharmacy.   Follow-Up: At [facility], you and your health needs are our priority.  As part of our continuing mission to provide you with exceptional heart care, we have created designated Provider Care Teams.  These Care Teams include your primary Cardiologist (physician) and Advanced Practice Providers (APPs -  Physician Assistants and Nurse Practitioners) who all work together to provide you with the care you need, when you need it. You will need a follow up appointment in 6  months.  Please call our office 2 months in advance to schedule this appointment.  You may see [name] [name], MD or one of the following Advanced Practice Providers on your designated Care Team: [place], [place] . Fabian Sharp, PA-C . You will receive a reminder letter in the mail two months in advance. If you don't receive a letter, please call our office to schedule the follow-up appointment.    Hilbert Corrigan, [place]  05/29/2019 11:08 PM    [facility] [place], [place], [place]  [address] Phone: [phone]; Fax: [phone]

## 2019-06-03 ENCOUNTER — Other Ambulatory Visit (INDEPENDENT_AMBULATORY_CARE_PROVIDER_SITE_OTHER): Payer: Medicare Other

## 2019-06-03 DIAGNOSIS — I739 Peripheral vascular disease, unspecified: Secondary | ICD-10-CM

## 2019-06-03 DIAGNOSIS — I5032 Chronic diastolic (congestive) heart failure: Secondary | ICD-10-CM

## 2019-06-03 DIAGNOSIS — I25119 Atherosclerotic heart disease of native coronary artery with unspecified angina pectoris: Secondary | ICD-10-CM

## 2019-06-03 DIAGNOSIS — I10 Essential (primary) hypertension: Secondary | ICD-10-CM

## 2019-06-03 DIAGNOSIS — E785 Hyperlipidemia, unspecified: Secondary | ICD-10-CM

## 2019-06-03 DIAGNOSIS — I2510 Atherosclerotic heart disease of native coronary artery without angina pectoris: Secondary | ICD-10-CM

## 2019-06-03 DIAGNOSIS — J449 Chronic obstructive pulmonary disease, unspecified: Secondary | ICD-10-CM

## 2019-10-29 ENCOUNTER — Other Ambulatory Visit: Payer: Self-pay | Admitting: Family Medicine

## 2019-10-29 ENCOUNTER — Telehealth: Payer: Self-pay

## 2019-10-29 NOTE — Telephone Encounter (Signed)
Sonya Small called to report that she fell on Tuesday striking her head and hurting her arm. She remembers experiencing vertigo and nausea and then she awakened on the floor.  She was instructed per Dr. [name] to go to the ED for evaluation and treatment.

## 2019-11-03 ENCOUNTER — Other Ambulatory Visit: Payer: Self-pay | Admitting: Family Medicine

## 2019-11-03 ENCOUNTER — Other Ambulatory Visit: Payer: Self-pay | Admitting: Physician Assistant

## 2019-11-03 MED ORDER — PREGABALIN 50 MG PO CAPS: 50.0000 mg | ORAL_CAPSULE | Freq: Two times a day (BID) | ORAL | 2 refills | Status: DC

## 2019-11-18 NOTE — Progress Notes (Signed)
Cardiology Office Note    Date:  11/19/2019   ID:  Sonya Small, Sonya Small 25-Dec-1950, MRN [id number]  PCP:  Sonya Brome, MD  Cardiologist:  Sonya Small  Chief Complaint  Patient presents with  . Follow-up  . Shortness of Breath  . Headache  . Chest Pain  . Edema    History of Present Illness:  Sonya Small is a 69 y.o. female with PMH of HTN, HLD, OSA, PAD, CVA, chronic diastolic heart failureand CAD. Patient had a NSTEMI in March 2010 and underwent DES to left circumflex.She had anotherNSTEMI in May 2011 secondary to thrombotic RCA lesion, cardiac catheterization showed nonobstructive disease, medical therapy was recommended. She was admitted for recurrent chest pain in January 2014, Myoview was negative. She was admitted for acute CVA in October 2015 after presenting with slurred speech and facial drooping. MRI showed acute right MCA infarct involving basal ganglia and periventricular white matter. She was started on aspirin along with Plavix. Myoview obtained in August 2017 showed no ischemia, normal EF. She was last seen by Sonya Small in June 2018, she was very sedentary at the time and notes dyspnea on exertion with any activity. ABI obtained in July 2018 was normal.   She was seen by Sonya Deforest Sonya Small on 03/13/2018  with chest pain.  A Myoview study on 03/25/2018 which showed EF 52%, small sized moderate intensity fixed apical perfusion defect likely attenuation artifact was seen, no reversible ischemia otherwise.  Overall this is a low risk stress test.  Seen again on 05/22/2018, he was having a lot of dizziness. She was noted to be taking different doses of medication than what was prescribed. BP was stable. Polypharmacy with use of Xanax, oxycodone, Tizanidiine, Zoloft. Dizziness felt to be more related to medication than to a vascular issue. Carotid dopplers were checked and were OK.  She was admitted in May 2020 with PNA and respiratory failure requiring ventilator support.  Treated with antibiotics and breathing treatments with improvement. Still smoking.  On follow up today her primary complaint is of foot pain related to neuropathy. On Lyrica. Prior LE dopplers showed no significant PAD. She denies any chest pain or SOB. Occ. Tightness in chest. Fenofibrate discontinued and now on Vascepa.      Past Medical History:  Diagnosis Date  . Anxiety   . CAD (coronary artery disease)    a. NSTEMI 11/2008 s/p DES to LCx (3.0x12 Xience); b. NSTEMI 01/2010 secondary to thrombotic RCA lesion (non-obstructive)-->med rx (integrilin x 24 hrs + plavix); c. 09/2012 negative Myoview.  . Chronic diastolic CHF (congestive heart failure) ([place])    a. 06/2014 Echo: EF 55-60%, no rwma, Gr1 DD, mild AI.  [name] Depression   . Dizziness   . GERD (gastroesophageal reflux disease)   . Headache   . Hyperlipidemia   . Hypertensive heart disease   . Lumbar disc disease   . Myocardial infarction ([place])   . Obstructive sleep apnea   . OP (osteoporosis)   . Overweight(278.02)   . PAD (peripheral artery disease) ([place])    a. Emboli to R foot 2010 from partially occlusive lesion in R EIA, s/p stenting. - followed by Dr. Donnetta Hutching;  b. 10/2015 ABIs: R 1.03, L 0.97.  [name] Restless leg   . Stroke ([place])   . TIA (transient ischemic attack)   . Tobacco abuse     Past Surgical History:  Procedure Laterality Date  . EYE SURGERY     at age 70  .  hysterectomy -age 21    . ILIAC ARTERY STENT     RIGHT ILIAC STENT  . KNEE ARTHROSCOPY    . LUMBAR LAMINECTOMY    . TUBAL LIGATION      Current Medications: Outpatient Medications Prior to Visit  Medication Sig Dispense Refill  . albuterol (PROVENTIL HFA;VENTOLIN HFA) 108 (90 BASE) MCG/ACT inhaler Inhale 2 puffs into the lungs every 4 (four) hours as needed for wheezing or shortness of breath.    [name] amLODipine (NORVASC) 10 MG tablet Take 10 mg by mouth daily.    . clopidogrel (PLAVIX) 75 MG tablet Take 1 tablet (75 mg total) by mouth daily. 90 tablet 0  .  fenofibrate 160 MG tablet TAKE ONE (1) TABLET ONCE DAILY 90 tablet 0  . fluticasone (FLONASE) 50 MCG/ACT nasal spray Place 1 spray into both nostrils daily.    . furosemide (LASIX) 40 MG tablet Take 80 mg by mouth daily.    [name] lisinopril (ZESTRIL) 5 MG tablet Take 10 mg by mouth daily.     . metoprolol tartrate (LOPRESSOR) 50 MG tablet Take 50 mg by mouth daily.     . nitroGLYCERIN (NITROSTAT) 0.4 MG SL tablet Place 1 tablet (0.4 mg total) under the tongue every 5 (five) minutes as needed. 25 tablet 11  . pregabalin (LYRICA) 50 MG capsule Take 1 capsule (50 mg total) by mouth 2 (two) times daily. 60 capsule 2  . rosuvastatin (CRESTOR) 20 MG tablet TAKE ONE (1) TABLET BY MOUTH ONCE DAILY 90 tablet 1  . tiZANidine (ZANAFLEX) 4 MG tablet Take 4 mg by mouth in the morning and at bedtime.     . TRULICITY [id number] 0000000 SOPN INJECT 0.5ML BY SUBCUTANEOUS ROUTE EVERY7 DAYS IN THE ABDOMEN, THIGH, OR UPPER ARM ROTATING INJECTION SITES 6 mL 1  . VASCEPA 1 g CAPS Take 1-2 capsules by mouth See admin instructions. 2 capsules and 1 capsule at night    . venlafaxine XR (EFFEXOR-XR) 75 MG 24 hr capsule TAKE 1 CAPSULE BY MOUTH EVERY MORNING 90 capsule 1  . diclofenac sodium (VOLTAREN) 1 % GEL Apply 2 g topically daily as needed (mild pain).    . furosemide (LASIX) 20 MG tablet Take 20 mg by mouth 2 (two) times daily.    . mirabegron ER (MYRBETRIQ) 50 MG TB24 tablet Take 50 mg by mouth daily.    [name] NEXIUM 40 MG capsule Take 1 capsule (40 mg total) by mouth daily at 12 noon. 30 capsule 6  . potassium chloride SA (K-DUR) 20 MEQ tablet Take 1 tablet (20 mEq total) by mouth daily for 3 days. 3 tablet 0  . sertraline (ZOLOFT) 100 MG tablet Take 2 tablets (200 mg total) by mouth daily for 30 days. 60 tablet 0  . venlafaxine (EFFEXOR) 75 MG tablet Take 150 mg by mouth daily.     No facility-administered medications prior to visit.     Allergies:   Patient has no known allergies.   Social History   Socioeconomic  History  . Marital status: Legally Separated    Spouse name: Not on file  . Number of children: 3  . Years of education: 10 th  . Highest education level: Not on file  Occupational History  . Occupation: DISABLED    Employer: UNEMPLOYED  Tobacco Use  . Smoking status: Former Smoker    Types: Cigarettes    Quit date: 11/20/2012    Years since quitting: 7.0  . Smokeless tobacco: Never Used  Substance and  Sexual Activity  . Alcohol use: No    Alcohol/week: 0.0 standard drinks  . Drug use: No  . Sexual activity: Never  Other Topics Concern  . Not on file  Social History Narrative   Patient is single with 3 children, 1 deceased.   Patient is right handed.   Patient has 10 th grade education.   Patient drinks 5 or more cups daily.   Social Determinants of Health   Financial Resource Strain:   . Difficulty of Paying Living Expenses: Not on file  Food Insecurity:   . Worried About Charity fundraiser in the Last Year: Not on file  . Ran Out of Food in the Last Year: Not on file  Transportation Needs:   . Lack of Transportation (Medical): Not on file  . Lack of Transportation (Non-Medical): Not on file  Physical Activity:   . Days of Exercise per Week: Not on file  . Minutes of Exercise per Session: Not on file  Stress:   . Feeling of Stress : Not on file  Social Connections:   . Frequency of Communication with Friends and Family: Not on file  . Frequency of Social Gatherings with Friends and Family: Not on file  . Attends Religious Services: Not on file  . Active Member of Clubs or Organizations: Not on file  . Attends Archivist Meetings: Not on file  . Marital Status: Not on file     Family History:  The patient's family history includes Alzheimer's disease in her mother; Cancer in her brother; Depression in her brother; Diabetes in her brother; Heart disease in an other family member; Hepatitis C in her brother; Hypertension in her brother; Thyroid disease in  her brother.   ROS:   Please see the history of present illness.    ROS All other systems reviewed and are negative.   PHYSICAL EXAM:   VS:  BP 138/66 (BP Location: Left Arm, Patient Position: Sitting, Cuff Size: Normal)   Pulse 72   Temp (!) 97 F (36.1 C)   Ht 5\' 6"  (1.676 m)   Wt 176 lb (79.8 kg)   BMI 28.41 kg/m    GEN: Well nourished, well developed, in no acute distress  HEENT: normal  Neck: no JVD, carotid bruits, or masses Cardiac: RRR; no murmurs, rubs, or gallops,no edema  Respiratory:  clear to auscultation bilaterally, normal work of breathing GI: soft, nontender, nondistended, + BS MS: no deformity or atrophy  Skin: warm and dry, no rash Neuro:  Alert and Oriented x 3, Strength and sensation are intact Psych: euthymic mood, full affect  Wt Readings from Last 3 Encounters:  11/19/19 176 lb (79.8 kg)  05/28/19 179 lb 12.8 oz (81.6 kg)  02/06/19 179 lb 14.3 oz (81.6 kg)      Studies/Labs Reviewed:   EKG:  EKG is ordered today. NSR with old anterior infarct. I have personally reviewed and interpreted this study.   Recent Labs: 01/31/2019: B Natriuretic Peptide 299.1 02/04/2019: ALT 22 02/06/2019: BUN 20; Creatinine, Ser 0.71; Hemoglobin 13.8; Magnesium 1.4; Platelets 255; Potassium 4.4; Sodium 143   Lipid Panel    Component Value Date/Time   CHOL 179 07/13/2014 0108   TRIG 212 (H) 07/13/2014 0108   HDL 36 (L) 07/13/2014 0108   CHOLHDL 5.0 07/13/2014 0108   VLDL 42 (H) 07/13/2014 0108   LDLCALC 101 (H) 07/13/2014 0108   Labs dated 06/17/18: CBC normal except for elevated WBC 13.3.  Creatinine 1.14. Potassium  5.5.  Dated 05/23/18: cholesterol 142, triglycerides 212, HDL 35, LDL 65.  Dated 07/29/18: Normal chemistries and CBC.  Dated 09/02/19: cholesterol 165, triglycerides 146, HDL 49, LDL 62. A1c 7%. Creatinine 0.56. potassium 3.3. CBC and TSH normal.  Additional studies/ records that were reviewed today include:   Myoview 03/25/2018  The left ventricular  ejection fraction is mildly decreased (45-54%).  Nuclear stress EF: 52%.  No T wave inversion was noted during stress.  There was no ST segment deviation noted during stress.  Defect 1: There is a small defect of moderate severity.  This is a low risk study.   Small size, moderate intensity fixed apical perfusion defect, likely attenuation artifact. No reversible ischemia. LVEF 52% with normal wall motion. This is a low risk study.    ASSESSMENT:    No diagnosis found.   PLAN:  In order of problems listed above:  1. CAD: really asymptomatic. Marland KitchenMyoview in August 2019 was low risk.  Continue aspirin and Plavix.   2. Hypertension: Blood pressure is well controlled.  3. Carotid artery disease: She has a history of mild carotid artery disease. Unchanged on recent doppler.  4. Chronic diastolic heart failure: Appears to be euvolemic on physical exam. Continue lasix daily  5.   Dyslipidemia. On Crestor and Vascepa.  6.   Tobacco abuse. Recommend smoking cessation.       Medication Adjustments/Labs and Tests Ordered: Current medicines are reviewed at length with the patient today.  Concerns regarding medicines are outlined above.  Medication changes, Labs and Tests ordered today are listed in the Patient Instructions below. Patient Instructions  Quit smoking  Continue your current medication  Follow up 6 months.    Signed, Sehaj Kolden [name], MD  11/19/2019 3:37 PM    [facility] [address], [place], [place]  [address] Phone: [phone]; Fax: [phone]

## 2019-11-19 ENCOUNTER — Encounter: Payer: Self-pay | Admitting: Cardiology

## 2019-11-19 ENCOUNTER — Other Ambulatory Visit: Payer: Self-pay

## 2019-11-19 ENCOUNTER — Ambulatory Visit (INDEPENDENT_AMBULATORY_CARE_PROVIDER_SITE_OTHER): Payer: Medicare Other | Admitting: Cardiology

## 2019-11-19 VITALS — BP 138/66 | HR 72 | Temp 97.0°F | Ht 66.0 in | Wt 176.0 lb

## 2019-11-19 DIAGNOSIS — E785 Hyperlipidemia, unspecified: Secondary | ICD-10-CM | POA: Diagnosis not present

## 2019-11-19 DIAGNOSIS — I25119 Atherosclerotic heart disease of native coronary artery with unspecified angina pectoris: Secondary | ICD-10-CM

## 2019-11-19 DIAGNOSIS — I5032 Chronic diastolic (congestive) heart failure: Secondary | ICD-10-CM

## 2019-11-19 DIAGNOSIS — I10 Essential (primary) hypertension: Secondary | ICD-10-CM | POA: Diagnosis not present

## 2019-11-19 DIAGNOSIS — Z720 Tobacco use: Secondary | ICD-10-CM

## 2019-11-19 NOTE — Patient Instructions (Signed)
Quit smoking  Continue your current medication  Follow up 6 months.

## 2019-11-24 ENCOUNTER — Other Ambulatory Visit: Payer: Self-pay | Admitting: Family Medicine

## 2019-12-02 ENCOUNTER — Other Ambulatory Visit: Payer: Self-pay | Admitting: Family Medicine

## 2019-12-03 ENCOUNTER — Encounter: Payer: Self-pay | Admitting: Family Medicine

## 2019-12-03 DIAGNOSIS — F330 Major depressive disorder, recurrent, mild: Secondary | ICD-10-CM | POA: Insufficient documentation

## 2019-12-03 DIAGNOSIS — F331 Major depressive disorder, recurrent, moderate: Secondary | ICD-10-CM | POA: Insufficient documentation

## 2019-12-03 DIAGNOSIS — F17219 Nicotine dependence, cigarettes, with unspecified nicotine-induced disorders: Secondary | ICD-10-CM | POA: Insufficient documentation

## 2019-12-03 DIAGNOSIS — E1169 Type 2 diabetes mellitus with other specified complication: Secondary | ICD-10-CM | POA: Insufficient documentation

## 2019-12-03 NOTE — Assessment & Plan Note (Addendum)
Add Vraylar 1.5 mg once daily. Continue other medicatons.

## 2019-12-03 NOTE — Assessment & Plan Note (Signed)
Well controlled.  ?No changes to medicines.  ?Continue to work on eating a healthy diet and exercise.  ?Labs drawn today.  ?

## 2019-12-03 NOTE — Assessment & Plan Note (Signed)
Fair control No changes to medicines.  Recommend quit smoking.

## 2019-12-03 NOTE — Patient Instructions (Addendum)
Hypertensive heart disease Well controlled.  No changes to medicines.  Continue to work on eating a healthy diet and exercise.  Labs drawn today.   COPD (chronic obstructive pulmonary disease) Fair control No changes to medicines.  Recommend quit smoking.  Cigarette nicotine dependence with nicotine-induced disorder Recommend tobacco cessation.   Hyperlipidemia Well controlled.  No changes to medicines.  Continue to work on eating a healthy diet and exercise.  Labs drawn today.   Major depressive disorder, Major Start on vraylar 1.5 mg once daily. Continue effexor xr.   Bladder incontinence: Start oxybutynin.  STRONGLY RECOMMEND COVID 19 VACCINE.   Smoking Tobacco Information, Adult Smoking tobacco can be harmful to your health. Tobacco contains a poisonous (toxic), colorless chemical called nicotine. Nicotine is addictive. It changes the brain and can make it hard to stop smoking. Tobacco also has other toxic chemicals that can hurt your body and raise your risk of many cancers. How can smoking tobacco affect me? Smoking tobacco puts you at risk for:  Cancer. Smoking is most commonly associated with lung cancer, but can also lead to cancer in other parts of the body.  Chronic obstructive pulmonary disease (COPD). This is a long-term lung condition that makes it hard to breathe. It also gets worse over time.  High blood pressure (hypertension), heart disease, stroke, or heart attack.  Lung infections, such as pneumonia.  Cataracts. This is when the lenses in the eyes become clouded.  Digestive problems. This may include peptic ulcers, heartburn, and gastroesophageal reflux disease (GERD).  Oral health problems, such as gum disease and tooth loss.  Loss of taste and smell. Smoking can affect your appearance by causing:  Wrinkles.  Yellow or stained teeth, fingers, and fingernails. Smoking tobacco can also affect your social life, because:  It may be challenging to  find places to smoke when away from home. Many workplaces, [employer], hotels, and public places are tobacco-free.  Smoking is expensive. This is due to the cost of tobacco and the long-term costs of treating health problems from smoking.  Secondhand smoke may affect those around you. Secondhand smoke can cause lung cancer, breathing problems, and heart disease. Children of smokers have a higher risk for: ? Sudden infant death syndrome (SIDS). ? Ear infections. ? Lung infections. If you currently smoke tobacco, quitting now can help you:  Lead a longer and healthier life.  Look, smell, breathe, and feel better over time.  Save money.  Protect others from the harms of secondhand smoke. What actions can I take to prevent health problems? Quit smoking   Do not start smoking. Quit if you already do.  Make a plan to quit smoking and commit to it. Look for programs to help you and ask your health care provider for recommendations and ideas.  Set a date and write down all the reasons you want to quit.  Let your friends and family know you are quitting so they can help and support you. Consider finding friends who also want to quit. It can be easier to quit with someone else, so that you can support each other.  Talk with your health care provider about using nicotine replacement medicines to help you quit, such as gum, lozenges, patches, sprays, or pills.  Do not replace cigarette smoking with electronic cigarettes, which are commonly called e-cigarettes. The safety of e-cigarettes is not known, and some may contain harmful chemicals.  If you try to quit but return to smoking, stay positive. It is common to  slip up when you first quit, so take it one day at a time.  Be prepared for cravings. When you feel the urge to smoke, chew gum or suck on hard candy. Lifestyle  Stay busy and take care of your body.  Drink enough fluid to keep your urine pale yellow.  Get plenty of exercise  and eat a healthy diet. This can help prevent weight gain after quitting.  Monitor your eating habits. Quitting smoking can cause you to have a larger appetite than when you smoke.  Find ways to relax. Go out with friends or family to a movie or a restaurant where people do not smoke.  Ask your health care provider about having regular tests (screenings) to check for cancer. This may include blood tests, imaging tests, and other tests.  Find ways to manage your stress, such as meditation, yoga, or exercise. Where to find support To get support to quit smoking, consider:  Asking your health care provider for more information and resources.  Taking classes to learn more about quitting smoking.  Looking for local organizations that offer resources about quitting smoking.  Joining a support group for people who want to quit smoking in your local community.  Calling the smokefree.gov counselor helpline: 1-800-Quit-Now (801)381-8524) Where to find more information You may find more information about quitting smoking from:  HelpGuide.org: www.helpguide.org  https://hall.com/: smokefree.gov  American Lung Association: www.lung.org Contact a health care provider if you:  Have problems breathing.  Notice that your lips, nose, or fingers turn blue.  Have chest pain.  Are coughing up blood.  Feel faint or you pass out.  Have other health changes that cause you to worry. Summary  Smoking tobacco can negatively affect your health, the health of those around you, your finances, and your social life.  Do not start smoking. Quit if you already do. If you need help quitting, ask your health care provider.  Think about joining a support group for people who want to quit smoking in your local community. There are many effective programs that will help you to quit this behavior. This information is not intended to replace advice given to you by your health care provider. Make sure you  discuss any questions you have with your health care provider. Document Revised: 06/05/2019 Document Reviewed: 09/25/2016 Elsevier Patient Education  2020 [employer].

## 2019-12-03 NOTE — Progress Notes (Addendum)
Subjective:  Patient ID: Sonya Small, female    DOB: 11-25-1950  Age: 69 y.o. MRN: [id number]  Chief Complaint  Patient presents with  . Hyperlipidemia  . Diabetes  . Hypertension  . Congestive Heart Failure    HPI  Sonya Small presents with type 2 diabetes mellitus with other specified complication.  Compliance with treatment has been good; she takes her medication as directed and follows up as directed.  Typical diet includes low carbohydrate.  No exercise. Current meds include an oral hypoglycemic ( Janumet 50/500 1 PO daily ).  She does not perform home blood glucose monitoring.  In regard to preventative care, she performs foot self-exams daily and her last ophthalmology exam was in 05/20/2019.      Dx with atherosclerotic heart disease of native coronary artery without angina pectoris; she is here today for routine follow-up.  Sonya Small has a prior history of a myocardial infarction and is currently on a beta blocker.  Her heart disease was first diagnosed more than 5 years ago.  Currently, her treatment regimen consists of daily 81 mg aspirin, an ACEI ( lisinopril ),  Lopressor, and Crestor, fenofibrate, and vascepa.  No associated symptoms are reported.      Pt presents with hyperlipidemia.  Current treatment includes Crestor, Tricor, and Vascepa.  Compliance with treatment has been good; she takes her medication as directed, maintains her low cholesterol diet, follows up as directed, and maintains her exercise regimen.  She denies experiencing any hypercholesterolemia related symptoms.      Hypertension was first diagnosed several years ago.  Her current cardiac medication regimen includes a beta-blocker ( metoprolol ) and Zestril.  [name] does not check her blood pressure other than at her clinic appointments.  She is tolerating the medication well without side effects.  Compliance with treatment has been good; she takes her medication as directed and follows up as directed.      [name] presents  with a diagnosis of chronic diastolic (congestive) heart failure.  The course has been stable and nonprogressive.  Currently on lisinopril and lopressor.      Simple chronic bronchitis details; the duration of COPD has been several years.  Currently on Breo and ventolin.      Social History   Socioeconomic History  . Marital status: Divorced    Spouse name: Not on file  . Number of children: 3  . Years of education: 10 th  . Highest education level: Not on file  Occupational History  . Occupation: DISABLED    Employer: UNEMPLOYED  Tobacco Use  . Smoking status: Current Every Day Smoker    Packs/day: 0.50    Types: Cigarettes    Last attempt to quit: 11/20/2012    Years since quitting: 7.0  . Smokeless tobacco: Never Used  Substance and Sexual Activity  . Alcohol use: No    Alcohol/week: 0.0 standard drinks  . Drug use: No  . Sexual activity: Never  Other Topics Concern  . Not on file  Social History Narrative   Patient is single with 3 children, 1 deceased.   Patient is right handed.   Patient has 10 th grade education.   Patient drinks 5 or more cups daily.   Social Determinants of Health   Financial Resource Strain:   . Difficulty of Paying Living Expenses:   Food Insecurity:   . Worried About Charity fundraiser in the Last Year:   . [place] in the Last Year:  Transportation Needs:   . Film/video editor (Medical):   Sonya Small Lack of Transportation (Non-Medical):   Physical Activity:   . Days of Exercise per Week:   . Minutes of Exercise per Session:   Stress:   . Feeling of Stress :   Social Connections:   . Frequency of Communication with Friends and Family:   . Frequency of Social Gatherings with Friends and Family:   . Attends Religious Services:   . Active Member of Clubs or Organizations:   . Attends Archivist Meetings:   Sonya Small Marital Status:    Past Medical History:  Diagnosis Date  . Anxiety   . CAD (coronary artery disease)    a.  NSTEMI 11/2008 s/p DES to LCx (3.0x12 Xience); b. NSTEMI 01/2010 secondary to thrombotic RCA lesion (non-obstructive)-->med rx (integrilin x 24 hrs + plavix); c. 09/2012 negative Myoview.  . Chronic diastolic CHF (congestive heart failure) ([place])    a. 06/2014 Echo: EF 55-60%, no rwma, Gr1 DD, mild AI.  Sonya Small Depression   . Dizziness   . Drug induced constipation   . Generalized hyperhidrosis   . GERD (gastroesophageal reflux disease)   . Headache   . Hyperlipidemia   . Hypertensive heart disease   . Lumbar disc disease   . Metabolic encephalopathy   . Mixed hyperlipidemia   . Myocardial infarction ([place])   . Obstructive sleep apnea   . OP (osteoporosis)   . Osteoarthritis   . Osteoporosis   . Other malaise   . Overweight(278.02)   . PAD (peripheral artery disease) ([place])    a. Emboli to R foot 2010 from partially occlusive lesion in R EIA, s/p stenting. - followed by Dr. Donnetta Hutching;  b. 10/2015 ABIs: R 1.03, L 0.97.  Sonya Small Restless leg   . Sleep apnea   . Stroke ([place])   . TIA (transient ischemic attack)   . Tobacco abuse   . Urge incontinence    Family History  Problem Relation Age of Onset  . Alzheimer's disease Mother   . Cancer Brother   . Heart disease Other        Grandfather  . Hepatitis C Brother   . Diabetes Brother   . Thyroid disease Brother   . Hypertension Brother   . Depression Brother     Review of Systems  Constitutional: Negative for chills, fatigue and fever.  HENT: Negative for congestion, ear pain and sore throat.   Respiratory: Negative for cough and shortness of breath.   Cardiovascular: Negative for chest pain.  Gastrointestinal: Negative for abdominal pain, constipation, diarrhea, nausea and vomiting.  Endocrine: Negative for polydipsia, polyphagia and polyuria.  Genitourinary: Negative for dysuria and urgency.       Nocturia. Bladder incontinence. No dysuria.   Musculoskeletal: Negative for arthralgias and myalgias.  Neurological: Negative for dizziness and  headaches.  Psychiatric/Behavioral: Negative for dysphoric mood. The patient is not nervous/anxious.      Objective:  BP 136/70   Pulse 80   Temp 98.4 F (36.9 C)   Resp 16   Ht 5\' 6"  (1.676 m)   Wt 171 lb 12.8 oz (77.9 kg)   BMI 27.73 kg/m   BP/Weight 12/04/2019 [id number]  Systolic BP XX123456 [id number]  Diastolic BP 70 66 77  Wt. (Lbs) 171.8 176 179.8  BMI 27.73 28.41 29.02    Physical Exam  Constitutional: She is oriented to person, place, and time and well-developed, well-nourished, and in no distress.  Cardiovascular: Normal rate,  regular rhythm and normal heart sounds.  Pulmonary/Chest: Effort normal and breath sounds normal. No respiratory distress.  Abdominal: Soft. Bowel sounds are normal. There is no abdominal tenderness.  Neurological: She is alert and oriented to person, place, and time.  Skin: Skin is warm and dry.  Psychiatric: Her mood appears anxious. She exhibits a depressed mood. She has a flat affect.    Lab Results  Component Value Date   WBC 10.5 12/04/2019   HGB 15.5 12/04/2019   HCT 47.1 (H) 12/04/2019   PLT 244 12/04/2019   GLUCOSE 131 (H) 12/04/2019   CHOL 167 12/04/2019   TRIG 180 (H) 12/04/2019   HDL 49 12/04/2019   LDLCALC 87 12/04/2019   ALT 16 12/04/2019   AST 19 12/04/2019   NA 147 (H) 12/04/2019   K 4.0 12/04/2019   CL 102 12/04/2019   CREATININE 0.62 12/04/2019   BUN 13 12/04/2019   CO2 27 12/04/2019   TSH 0.619 12/04/2019   INR 1.0 01/31/2019   HGBA1C 7.1 (H) 12/04/2019   MICROALBUR 30 12/04/2019      Assessment & Plan:  Hypertensive heart disease Well controlled.  No changes to medicines.  Continue to work on eating a healthy diet and exercise.  Labs drawn today.   COPD (chronic obstructive pulmonary disease) Fair control No changes to medicines.  Recommend quit smoking.  Cigarette nicotine dependence with nicotine-induced disorder Recommend tobacco cessation.   Hyperlipidemia Well controlled.  No  changes to medicines.  Continue to work on eating a healthy diet and exercise.  Labs drawn today.   Moderate recurrent major depression (HCC) Add Vraylar 1.5 mg once daily. Continue other medicatons.   Type 2 diabetes mellitus with hypertriglyceridemia (HCC) Well controlled.  No changes to medicines.  Continue to work on eating a healthy diet and exercise.  Continue to check sugars daily. Labs drawn today.   PVD (peripheral vascular disease) Continue current treatment.   Ingrown toenail of both feet Refer to podiatry. Great toe nails BL.  No purulent drainage/erythema concerning for infection.  Hypertensive heart disease Well controlled.  No changes to medicines.  Continue to work on eating a healthy diet and exercise.  Labs drawn today.   COPD (chronic obstructive pulmonary disease) Fair control No changes to medicines.  Recommend quit smoking.  Cigarette nicotine dependence with nicotine-induced disorder Recommend tobacco cessation.   Hyperlipidemia Well controlled.  No changes to medicines.  Continue to work on eating a healthy diet and exercise.  Labs drawn today.   Moderate recurrent major depression (HCC) Add Vraylar 1.5 mg once daily. Continue other medicatons.   Type 2 diabetes mellitus with hypertriglyceridemia (HCC) Well controlled.  No changes to medicines.  Continue to work on eating a healthy diet and exercise.  Continue to check sugars daily. Labs drawn today.   PVD (peripheral vascular disease) Continue current treatment.   Ingrown toenail of both feet Refer to podiatry. Great toe nails BL.  No purulent drainage/erythema concerning for infection.      Follow-up: Return in about 4 weeks (around 01/01/2020).  AVS was given to patient prior to departure.  [name] [facility] [phone]

## 2019-12-03 NOTE — Assessment & Plan Note (Signed)
Recommend tobacco cessation

## 2019-12-04 ENCOUNTER — Other Ambulatory Visit: Payer: Self-pay

## 2019-12-04 ENCOUNTER — Ambulatory Visit (INDEPENDENT_AMBULATORY_CARE_PROVIDER_SITE_OTHER): Payer: Medicare Other | Admitting: Family Medicine

## 2019-12-04 VITALS — BP 136/70 | HR 80 | Temp 98.4°F | Resp 16 | Ht 66.0 in | Wt 171.8 lb

## 2019-12-04 DIAGNOSIS — E782 Mixed hyperlipidemia: Secondary | ICD-10-CM

## 2019-12-04 DIAGNOSIS — I739 Peripheral vascular disease, unspecified: Secondary | ICD-10-CM | POA: Diagnosis not present

## 2019-12-04 DIAGNOSIS — E781 Pure hyperglyceridemia: Secondary | ICD-10-CM | POA: Diagnosis not present

## 2019-12-04 DIAGNOSIS — I110 Hypertensive heart disease with heart failure: Secondary | ICD-10-CM | POA: Diagnosis not present

## 2019-12-04 DIAGNOSIS — I5042 Chronic combined systolic (congestive) and diastolic (congestive) heart failure: Secondary | ICD-10-CM

## 2019-12-04 DIAGNOSIS — E1169 Type 2 diabetes mellitus with other specified complication: Secondary | ICD-10-CM | POA: Diagnosis not present

## 2019-12-04 DIAGNOSIS — I63312 Cerebral infarction due to thrombosis of left middle cerebral artery: Secondary | ICD-10-CM

## 2019-12-04 DIAGNOSIS — R531 Weakness: Secondary | ICD-10-CM

## 2019-12-04 DIAGNOSIS — J410 Simple chronic bronchitis: Secondary | ICD-10-CM

## 2019-12-04 DIAGNOSIS — L600 Ingrowing nail: Secondary | ICD-10-CM

## 2019-12-04 DIAGNOSIS — F331 Major depressive disorder, recurrent, moderate: Secondary | ICD-10-CM

## 2019-12-04 DIAGNOSIS — F17219 Nicotine dependence, cigarettes, with unspecified nicotine-induced disorders: Secondary | ICD-10-CM

## 2019-12-04 LAB — POCT URINALYSIS DIPSTICK
Bilirubin, UA: NEGATIVE
Blood, UA: NEGATIVE
Glucose, UA: NEGATIVE
Ketones, UA: NEGATIVE
Leukocytes, UA: NEGATIVE
Nitrite, UA: NEGATIVE
Spec Grav, UA: 1.02 (ref 1.010–1.025)
Urobilinogen, UA: 0.2 E.U./dL
pH, UA: 6 (ref 5.0–8.0)

## 2019-12-04 LAB — POCT UA - MICROALBUMIN: Microalbumin Ur, POC: 30 mg/L

## 2019-12-04 MED ORDER — OXYBUTYNIN CHLORIDE 5 MG PO TABS: ORAL_TABLET | ORAL | 3 refills | Status: DC

## 2019-12-05 LAB — COMPREHENSIVE METABOLIC PANEL
ALT: 16 IU/L (ref 0–32)
AST: 19 IU/L (ref 0–40)
Albumin/Globulin Ratio: 2 (ref 1.2–2.2)
Albumin: 4.3 g/dL (ref 3.8–4.8)
Alkaline Phosphatase: 70 IU/L (ref 39–117)
BUN/Creatinine Ratio: 21 (ref 12–28)
BUN: 13 mg/dL (ref 8–27)
Bilirubin Total: 0.2 mg/dL (ref 0.0–1.2)
CO2: 27 mmol/L (ref 20–29)
Calcium: 9.2 mg/dL (ref 8.7–10.3)
Chloride: 102 mmol/L (ref 96–106)
Creatinine, Ser: 0.62 mg/dL (ref 0.57–1.00)
GFR calc Af Amer: 106 mL/min/{1.73_m2} (ref >59)
GFR calc non Af Amer: 92 mL/min/{1.73_m2} (ref >59)
Globulin, Total: 2.2 g/dL (ref 1.5–4.5)
Glucose: 131 mg/dL — ABNORMAL HIGH (ref 65–99)
Potassium: 4 mmol/L (ref 3.5–5.2)
Sodium: 147 mmol/L — ABNORMAL HIGH (ref 134–144)
Total Protein: 6.5 g/dL (ref 6.0–8.5)

## 2019-12-05 LAB — LIPID PANEL
Chol/HDL Ratio: 3.4 ratio (ref 0.0–4.4)
Cholesterol, Total: 167 mg/dL (ref 100–199)
HDL: 49 mg/dL (ref >39)
LDL Chol Calc (NIH): 87 mg/dL (ref 0–99)
Triglycerides: 180 mg/dL — ABNORMAL HIGH (ref 0–149)
VLDL Cholesterol Cal: 31 mg/dL (ref 5–40)

## 2019-12-05 LAB — HEMOGLOBIN A1C
Est. average glucose Bld gHb Est-mCnc: 157 mg/dL
Hgb A1c MFr Bld: 7.1 % — ABNORMAL HIGH (ref 4.8–5.6)

## 2019-12-05 LAB — TSH: TSH: 0.619 u[IU]/mL (ref 0.450–4.500)

## 2019-12-05 LAB — CBC WITH DIFFERENTIAL/PLATELET
Basophils Absolute: 0.1 10*3/uL (ref 0.0–0.2)
Basos: 1 %
EOS (ABSOLUTE): 0.1 10*3/uL (ref 0.0–0.4)
Eos: 1 %
Hematocrit: 47.1 % — ABNORMAL HIGH (ref 34.0–46.6)
Hemoglobin: 15.5 g/dL (ref 11.1–15.9)
Immature Grans (Abs): 0.2 10*3/uL — ABNORMAL HIGH (ref 0.0–0.1)
Immature Granulocytes: 1 %
Lymphocytes Absolute: 2.5 10*3/uL (ref 0.7–3.1)
Lymphs: 24 %
MCH: 29 pg (ref 26.6–33.0)
MCHC: 32.9 g/dL (ref 31.5–35.7)
MCV: 88 fL (ref 79–97)
Monocytes Absolute: 0.8 10*3/uL (ref 0.1–0.9)
Monocytes: 8 %
Neutrophils Absolute: 6.9 10*3/uL (ref 1.4–7.0)
Neutrophils: 65 %
Platelets: 244 10*3/uL (ref 150–450)
RBC: 5.34 x10E6/uL — ABNORMAL HIGH (ref 3.77–5.28)
RDW: 13.2 % (ref 11.7–15.4)
WBC: 10.5 10*3/uL (ref 3.4–10.8)

## 2019-12-05 LAB — CARDIOVASCULAR RISK ASSESSMENT

## 2019-12-07 ENCOUNTER — Telehealth: Payer: Self-pay

## 2019-12-07 DIAGNOSIS — L600 Ingrowing nail: Secondary | ICD-10-CM | POA: Insufficient documentation

## 2019-12-07 DIAGNOSIS — R531 Weakness: Secondary | ICD-10-CM | POA: Insufficient documentation

## 2019-12-07 MED ORDER — ESOMEPRAZOLE MAGNESIUM 40 MG PO CPDR: 40.0000 mg | DELAYED_RELEASE_CAPSULE | Freq: Every day | ORAL | 3 refills | Status: DC

## 2019-12-07 MED ORDER — NITROGLYCERIN 0.4 MG SL SUBL: 0.4000 mg | SUBLINGUAL_TABLET | SUBLINGUAL | 1 refills | Status: DC | PRN

## 2019-12-07 NOTE — Telephone Encounter (Signed)
Sonya Small called to make Dr. [name] aware of her intolerance to [facility].  She had terrible nightmares and she was unable to keep her eyes open during the day due to extreme drowsiness.  She stopped the Vraylar.  She will bring the additional samples back if needed.

## 2019-12-07 NOTE — Telephone Encounter (Signed)
Agree with stopping vraylar. Start on lamictal 25 mg once daily x 1 week, then increase to lamictal 50 mg once daily.  Kc

## 2019-12-07 NOTE — Addendum Note (Signed)
Addended byRochel Brome on: 12/07/2019 09:35 PM   Modules accepted: Orders

## 2019-12-07 NOTE — Assessment & Plan Note (Signed)
Continue current treatment. 

## 2019-12-07 NOTE — Assessment & Plan Note (Signed)
Well controlled.  No changes to medicines.  Continue to work on eating a healthy diet and exercise.  Continue to check sugars daily. Labs drawn today.

## 2019-12-07 NOTE — Assessment & Plan Note (Signed)
Refer to podiatry. Great toe nails BL.  No purulent drainage/erythema concerning for infection.

## 2019-12-08 ENCOUNTER — Other Ambulatory Visit: Payer: Self-pay

## 2019-12-08 MED ORDER — LAMOTRIGINE 25 MG PO TABS: 25.0000 mg | ORAL_TABLET | Freq: Every day | ORAL | 0 refills | Status: DC

## 2019-12-08 NOTE — Telephone Encounter (Signed)
Patient informed. 

## 2019-12-09 ENCOUNTER — Telehealth: Payer: Self-pay

## 2019-12-09 NOTE — Telephone Encounter (Signed)
Sonya Small called and she received notification from the insurance company that her Nexium will no longer be covered under her insurance.  She has 1 week of the medication left.

## 2019-12-10 ENCOUNTER — Other Ambulatory Visit: Payer: Self-pay | Admitting: Family Medicine

## 2019-12-10 MED ORDER — LANSOPRAZOLE 30 MG PO CPDR: 30.0000 mg | DELAYED_RELEASE_CAPSULE | Freq: Two times a day (BID) | ORAL | 0 refills | Status: DC

## 2019-12-14 ENCOUNTER — Telehealth: Payer: Self-pay

## 2019-12-14 NOTE — Telephone Encounter (Signed)
PA for Lansoprazole submitted and approved via covermymeds.

## 2019-12-14 NOTE — Telephone Encounter (Signed)
Pharmacy sent over a fax stating that Protonix has the least interaction with plavix vs Lansoprazole. Note from the pharmacist also states that the Lansoprazole: " P/A Max daily dose of 1 capsule qd and Prevacid also has a drug interaction."

## 2019-12-14 NOTE — Telephone Encounter (Signed)
Sonya Small called to report that she is drowsy with the current dosage of the lamotrigine and she is not going to increase the dose this week

## 2019-12-15 ENCOUNTER — Other Ambulatory Visit: Payer: Self-pay | Admitting: Family Medicine

## 2019-12-15 MED ORDER — PANTOPRAZOLE SODIUM 40 MG PO TBEC: 40.0000 mg | DELAYED_RELEASE_TABLET | Freq: Two times a day (BID) | ORAL | 3 refills | Status: DC

## 2019-12-16 DIAGNOSIS — E1142 Type 2 diabetes mellitus with diabetic polyneuropathy: Secondary | ICD-10-CM | POA: Insufficient documentation

## 2019-12-18 ENCOUNTER — Other Ambulatory Visit: Payer: Self-pay

## 2019-12-18 ENCOUNTER — Encounter: Payer: Self-pay | Admitting: Nurse Practitioner

## 2019-12-18 ENCOUNTER — Ambulatory Visit (INDEPENDENT_AMBULATORY_CARE_PROVIDER_SITE_OTHER): Payer: Medicare Other | Admitting: Nurse Practitioner

## 2019-12-18 VITALS — BP 182/72 | HR 88 | Temp 97.9°F | Ht 66.0 in | Wt 176.0 lb

## 2019-12-18 DIAGNOSIS — R682 Dry mouth, unspecified: Secondary | ICD-10-CM

## 2019-12-18 DIAGNOSIS — R600 Localized edema: Secondary | ICD-10-CM

## 2019-12-18 DIAGNOSIS — I10 Essential (primary) hypertension: Secondary | ICD-10-CM

## 2019-12-18 MED ORDER — BIOTENE DRY MOUTH MT LIQD: 15.0000 mL | OROMUCOSAL | 0 refills | Status: DC | PRN

## 2019-12-18 MED ORDER — TORSEMIDE 20 MG PO TABS: 20.0000 mg | ORAL_TABLET | Freq: Every day | ORAL | 0 refills | Status: DC

## 2019-12-18 NOTE — Progress Notes (Signed)
Established Patient Office Visit  Subjective:  Patient ID: Sonya Small, female    DOB: Oct 21, 1950  Age: 69 y.o. MRN: [id number]  CC: Patient  is a 69 year old  female. She is here for bilateral lower extremity  swelling. The patient's medications were reviewed and reconciled since the patient's last visit.  History details were provided by the patient. The history appears to be reliable.   Chief Complaint  Patient presents with  . Edema    ANKLES/FEET    HPI  Sonya Small presents for  Bilateral Lower extremity edema, this is not new for patient. Patient reports swelling is worse. She has taken 80 mg of lasix daily and propped up her feet with no alleviation. Pt has a history of hypertension and MI  Pt presents for follow up of hypertension. Patient was diagnosed a few years ago. The patient is tolerating the medication well without side effects. Compliance with treatment has been good; including taking medication as directed. Current mediation regimen: amLoDipine 10 mg daily, metoprolol 50 mg daily. Sonya Small maintains a healthy diet and regular exercise regimen , and following up as directed. Patient was evaluated using exam, physical, labs and other information to perform evidence based decision making.   Past Medical History:  Diagnosis Date  . Anxiety   . CAD (coronary artery disease)    a. NSTEMI 11/2008 s/p DES to LCx (3.0x12 Xience); b. NSTEMI 01/2010 secondary to thrombotic RCA lesion (non-obstructive)-->med rx (integrilin x 24 hrs + plavix); c. 09/2012 negative Myoview.  . Chronic diastolic CHF (congestive heart failure) ([place])    a. 06/2014 Echo: EF 55-60%, no rwma, Gr1 DD, mild AI.  [name] Depression   . Dizziness   . Drug induced constipation   . Generalized hyperhidrosis   . GERD (gastroesophageal reflux disease)   . Headache   . Hyperlipidemia   . Hypertensive heart disease   . Lumbar disc disease   . Metabolic encephalopathy   . Mixed hyperlipidemia   . Myocardial infarction  ([place])   . Obstructive sleep apnea   . OP (osteoporosis)   . Osteoarthritis   . Osteoporosis   . Other malaise   . Overweight(278.02)   . PAD (peripheral artery disease) ([place])    a. Emboli to R foot 2010 from partially occlusive lesion in R EIA, s/p stenting. - followed by Dr. Donnetta Hutching;  b. 10/2015 ABIs: R 1.03, L 0.97.  [name] Restless leg   . Sleep apnea   . Stroke ([place])   . TIA (transient ischemic attack)   . Tobacco abuse   . Urge incontinence     Past Surgical History:  Procedure Laterality Date  . EYE SURGERY     at age 18  . hysterectomy -age 29    . ILIAC ARTERY STENT     RIGHT ILIAC STENT  . KNEE ARTHROSCOPY    . LUMBAR LAMINECTOMY    . TUBAL LIGATION      Family History  Problem Relation Age of Onset  . Alzheimer's disease Mother   . Cancer Brother   . Heart disease Other        Grandfather  . Hepatitis C Brother   . Diabetes Brother   . Thyroid disease Brother   . Hypertension Brother   . Depression Brother     Social History   Socioeconomic History  . Marital status: Divorced    Spouse name: Not on file  . Number of children: 3  . Years of  education: 10 th  . Highest education level: Not on file  Occupational History  . Occupation: DISABLED    Employer: UNEMPLOYED  Tobacco Use  . Smoking status: Current Every Day Smoker    Packs/day: 0.50    Types: Cigarettes    Last attempt to quit: 11/20/2012    Years since quitting: 7.1  . Smokeless tobacco: Never Used  Substance and Sexual Activity  . Alcohol use: No    Alcohol/week: 0.0 standard drinks  . Drug use: No  . Sexual activity: Never  Other Topics Concern  . Not on file  Social History Narrative   Patient is single with 3 children, 1 deceased.   Patient is right handed.   Patient has 10 th grade education.   Patient drinks 5 or more cups daily.   Social Determinants of Health   Financial Resource Strain:   . Difficulty of Paying Living Expenses:   Food Insecurity:   . Worried About Paediatric nurse in the Last Year:   . Arboriculturist in the Last Year:   Transportation Needs:   . Film/video editor (Medical):   [name] Lack of Transportation (Non-Medical):   Physical Activity:   . Days of Exercise per Week:   . Minutes of Exercise per Session:   Stress:   . Feeling of Stress :   Social Connections:   . Frequency of Communication with Friends and Family:   . Frequency of Social Gatherings with Friends and Family:   . Attends Religious Services:   . Active Member of Clubs or Organizations:   . Attends Archivist Meetings:   [name] Marital Status:   Intimate Partner Violence:   . Fear of Current or Ex-Partner:   . Emotionally Abused:   [name] Physically Abused:   . Sexually Abused:     Outpatient Medications Prior to Visit  Medication Sig Dispense Refill  . ALPRAZolam (XANAX) 0.5 MG tablet TAKE 1 TABLET BY MOUTH THREE TIMES A DAY 90 tablet 2  . clopidogrel (PLAVIX) 75 MG tablet Take 1 tablet (75 mg total) by mouth daily. 90 tablet 0  . diclofenac Sodium (VOLTAREN) 1 % GEL     . fenofibrate 160 MG tablet TAKE ONE (1) TABLET ONCE DAILY 90 tablet 0  . fluticasone (FLONASE) 50 MCG/ACT nasal spray Place 1 spray into both nostrils daily.    . furosemide (LASIX) 40 MG tablet Take 80 mg by mouth daily.    [name] glucose blood (ONETOUCH VERIO) test strip USE DAILY TO CHECK YOUR BLOOD SUGAR (E11.69)    . lansoprazole (PREVACID) 30 MG capsule     . lisinopril (ZESTRIL) 5 MG tablet Take 10 mg by mouth daily.     . metoprolol tartrate (LOPRESSOR) 50 MG tablet Take 50 mg by mouth daily.     . nitroGLYCERIN (NITROSTAT) 0.4 MG SL tablet Place 1 tablet (0.4 mg total) under the tongue every 5 (five) minutes as needed. 25 tablet 1  . ONETOUCH VERIO test strip USE DAILY TO CHECK YOUR BLOOD SUGAR (E11.69)    . oxyCODONE-acetaminophen (PERCOCET) 10-325 MG tablet Take 1 tablet by mouth every 6 (six) hours as needed.    . pantoprazole (PROTONIX) 40 MG tablet Take 1 tablet (40 mg total) by  mouth 2 (two) times daily. 180 tablet 3  . pregabalin (LYRICA) 50 MG capsule Take 50 capsules by mouth in the morning and at bedtime.    . rosuvastatin (CRESTOR) 20 MG tablet TAKE ONE (  1) TABLET BY MOUTH ONCE DAILY 90 tablet 1  . TRULICITY A999333 0000000 SOPN INJECT 0.5ML BY SUBCUTANEOUS ROUTE EVERY7 DAYS IN THE ABDOMEN, THIGH, OR UPPER ARM ROTATING INJECTION SITES 6 mL 1  . VASCEPA 1 g CAPS Take 1-2 capsules by mouth See admin instructions. 2 capsules and 1 capsule at night    . venlafaxine XR (EFFEXOR-XR) 75 MG 24 hr capsule TAKE 1 CAPSULE BY MOUTH EVERY MORNING 90 capsule 1  . amLODipine (NORVASC) 10 MG tablet TAKE ONE-HALF TABLET DAILY 45 tablet 1  . oxybutynin (DITROPAN) 5 MG tablet Once daily in am. 30 tablet 3  . tiZANidine (ZANAFLEX) 4 MG tablet Take 4 mg by mouth in the morning and at bedtime.     [name] albuterol (PROVENTIL HFA;VENTOLIN HFA) 108 (90 BASE) MCG/ACT inhaler Inhale 2 puffs into the lungs every 4 (four) hours as needed for wheezing or shortness of breath.    . lamoTRIgine (LAMICTAL) 25 MG tablet Take 1 tablet (25 mg total) by mouth daily. Take 1 daily for 7 days then increase to 2 daily 49 tablet 0  . pregabalin (LYRICA) 50 MG capsule Take 1 capsule (50 mg total) by mouth 2 (two) times daily. 60 capsule 2   No facility-administered medications prior to visit.    No Known Allergies  ROS Review of Systems  Constitutional: Negative for activity change, appetite change, fever and unexpected weight change.  HENT: Negative for facial swelling.   Eyes: Negative for pain and visual disturbance.  Respiratory: Negative for cough, chest tightness and shortness of breath.   Cardiovascular: Negative for chest pain and palpitations.  Gastrointestinal: Negative for abdominal distention and abdominal pain.  Genitourinary: Negative for difficulty urinating.  Musculoskeletal: Negative for arthralgias and myalgias.  Skin: Negative for rash.  Neurological: Negative for light-headedness and  headaches.  Psychiatric/Behavioral: Negative for confusion.      Objective:    Physical Exam  Constitutional: She is oriented to person, place, and time. She appears well-developed and well-nourished.  HENT:  Mouth/Throat: Oropharynx is clear and moist.  Eyes: Conjunctivae are normal.  Cardiovascular: Normal rate, regular rhythm and normal heart sounds.  Pulmonary/Chest: Effort normal and breath sounds normal.  Abdominal: Soft. Bowel sounds are normal.  Musculoskeletal:        General: No tenderness.     Cervical back: Normal range of motion and neck supple.  Neurological: She is alert and oriented to person, place, and time.  Skin: Skin is warm. No rash noted.  Psychiatric: Her behavior is normal.    BP (!) 182/72 (BP Location: Left Arm, Patient Position: Sitting)   Pulse 88   Temp 97.9 F (36.6 C) (Temporal)   Ht 5\' 6"  (1.676 m)   Wt 176 lb (79.8 kg)   SpO2 98%   BMI 28.41 kg/m  Wt Readings from Last 3 Encounters:  12/31/19 162 lb (73.5 kg)  12/25/19 174 lb (78.9 kg)  12/18/19 176 lb (79.8 kg)     Health Maintenance Due  Topic Date Due  . Hepatitis C Screening  Never done  . FOOT EXAM  Never done  . OPHTHALMOLOGY EXAM  Never done  . TETANUS/TDAP  Never done  . MAMMOGRAM  Never done  . COLONOSCOPY  Never done  . DEXA SCAN  Never done  . PNA vac Low Risk Adult (1 of 2 - PCV13) 05/22/2014    There are no preventive care reminders to display for this patient.  Lab Results  Component Value Date  TSH 0.619 12/04/2019   Lab Results  Component Value Date   WBC 10.5 12/04/2019   HGB 15.5 12/04/2019   HCT 47.1 (H) 12/04/2019   MCV 88 12/04/2019   PLT 244 12/04/2019   Lab Results  Component Value Date   NA 147 (H) 12/04/2019   K 4.0 12/04/2019   CO2 27 12/04/2019   GLUCOSE 131 (H) 12/04/2019   BUN 13 12/04/2019   CREATININE 0.62 12/04/2019   BILITOT 0.2 12/04/2019   ALKPHOS 70 12/04/2019   AST 19 12/04/2019   ALT 16 12/04/2019   PROT 6.5 12/04/2019    ALBUMIN 4.3 12/04/2019   CALCIUM 9.2 12/04/2019   ANIONGAP 8 02/06/2019   Lab Results  Component Value Date   CHOL 167 12/04/2019   Lab Results  Component Value Date   HDL 49 12/04/2019   Lab Results  Component Value Date   LDLCALC 87 12/04/2019   Lab Results  Component Value Date   TRIG 180 (H) 12/04/2019   Lab Results  Component Value Date   CHOLHDL 3.4 12/04/2019   Lab Results  Component Value Date   HGBA1C 7.1 (H) 12/04/2019      Assessment & Plan:  Dry mouth Biotene oral mouth rinse recommended and sent to pharmacy.  HTN (hypertension) Not well controlled.   changes to medicines.  Continue to work on eating a low salt/healthy diet and exercise.  No labs drawn today.   Localized edema Not well controlled.  Flomax changed to Torsemide 20 mg daily and follow up in 2 weeks .  No labs drawn today.   Problem List Items Addressed This Visit      Cardiovascular and Mediastinum   HTN (hypertension)    Not well controlled.   changes to medicines.  Continue to work on eating a low salt/healthy diet and exercise.  No labs drawn today.       Relevant Medications   torsemide (DEMADEX) 20 MG tablet     Digestive   Dry mouth    Biotene oral mouth rinse recommended and sent to pharmacy.      Relevant Medications   antiseptic oral rinse (BIOTENE) LIQD     Other   Localized edema - Primary    Not well controlled.  Flomax changed to Torsemide 20 mg daily and follow up in 2 weeks .  No labs drawn today.       Relevant Medications   torsemide (DEMADEX) 20 MG tablet      Meds ordered this encounter  Medications  . torsemide (DEMADEX) 20 MG tablet    Sig: Take 1 tablet (20 mg total) by mouth daily.    Dispense:  30 tablet    Refill:  0    Order Specific Question:   Supervising Provider    AnswerRochel Brome [id number]  . antiseptic oral rinse (BIOTENE) LIQD    Sig: 15 mLs by Mouth Rinse route as needed for dry mouth.    Dispense:  473 mL     Refill:  0    Order Specific Question:   Supervising Provider    Answer:   Shelton Silvas    Follow-up: Return in about 1 week (around 12/25/2019) for Bilateral edema. ( new medication started).    Ivy Lynn, NP

## 2019-12-18 NOTE — Assessment & Plan Note (Signed)
Not well controlled.   changes to medicines.  Continue to work on eating a low salt/healthy diet and exercise.  No labs drawn today.

## 2019-12-18 NOTE — Patient Instructions (Addendum)
Reduce salt, elevate legs, smoking cessation recommended. Take medication as prescribed. Follow up one week, bilateral edema, and new medication started  Hypertension, Adult Hypertension is another name for high blood pressure. High blood pressure forces your heart to work harder to pump blood. This can cause problems over time. There are two numbers in a blood pressure reading. There is a top number (systolic) over a bottom number (diastolic). It is best to have a blood pressure that is below 120/80. Healthy choices can help lower your blood pressure, or you may need medicine to help lower it. What are the causes? The cause of this condition is not known. Some conditions may be related to high blood pressure. What increases the risk?  Smoking.  Having type 2 diabetes mellitus, high cholesterol, or both.  Not getting enough exercise or physical activity.  Being overweight.  Having too much fat, sugar, calories, or salt (sodium) in your diet.  Drinking too much alcohol.  Having long-term (chronic) kidney disease.  Having a family history of high blood pressure.  Age. Risk increases with age.  Race. You may be at higher risk if you are African American.  Gender. Men are at higher risk than women before age 40. After age 67, women are at higher risk than men.  Having obstructive sleep apnea.  Stress. What are the signs or symptoms?  High blood pressure may not cause symptoms. Very high blood pressure (hypertensive crisis) may cause: ? Headache. ? Feelings of worry or nervousness (anxiety). ? Shortness of breath. ? Nosebleed. ? A feeling of being sick to your stomach (nausea). ? Throwing up (vomiting). ? Changes in how you see. ? Very bad chest pain. ? Seizures. How is this treated?  This condition is treated by making healthy lifestyle changes, such as: ? Eating healthy foods. ? Exercising more. ? Drinking less alcohol.  Your health care provider may prescribe  medicine if lifestyle changes are not enough to get your blood pressure under control, and if: ? Your top number is above 130. ? Your bottom number is above 80.  Your personal target blood pressure may vary. Follow these instructions at home: Eating and drinking   If told, follow the DASH eating plan. To follow this plan: ? Fill one half of your plate at each meal with fruits and vegetables. ? Fill one fourth of your plate at each meal with whole grains. Whole grains include whole-wheat pasta, brown rice, and whole-grain bread. ? Eat or drink low-fat dairy products, such as skim milk or low-fat yogurt. ? Fill one fourth of your plate at each meal with low-fat (lean) proteins. Low-fat proteins include fish, chicken without skin, eggs, beans, and tofu. ? Avoid fatty meat, cured and processed meat, or chicken with skin. ? Avoid pre-made or processed food.  Eat less than 1,500 mg of salt each day.  Do not drink alcohol if: ? Your doctor tells you not to drink. ? You are pregnant, may be pregnant, or are planning to become pregnant.  If you drink alcohol: ? Limit how much you use to:  0-1 drink a day for women.  0-2 drinks a day for men. ? Be aware of how much alcohol is in your drink. In the [place] one drink equals one 12 oz bottle of beer (355 mL), one 5 oz glass of wine (148 mL), or one 1 oz glass of hard liquor (44 mL). Lifestyle   Work with your doctor to stay at a healthy weight or  to lose weight. Ask your doctor what the best weight is for you.  Get at least 30 minutes of exercise most days of the week. This may include walking, swimming, or biking.  Get at least 30 minutes of exercise that strengthens your muscles (resistance exercise) at least 3 days a week. This may include lifting weights or doing Pilates.  Do not use any products that contain nicotine or tobacco, such as cigarettes, e-cigarettes, and chewing tobacco. If you need help quitting, ask your doctor.  Check  your blood pressure at home as told by your doctor.  Keep all follow-up visits as told by your doctor. This is important. Medicines  Take over-the-counter and prescription medicines only as told by your doctor. Follow directions carefully.  Do not skip doses of blood pressure medicine. The medicine does not work as well if you skip doses. Skipping doses also puts you at risk for problems.  Ask your doctor about side effects or reactions to medicines that you should watch for. Contact a doctor if you:  Think you are having a reaction to the medicine you are taking.  Have headaches that keep coming back (recurring).  Feel dizzy.  Have swelling in your ankles.  Have trouble with your vision. Get help right away if you:  Get a very bad headache.  Start to feel mixed up (confused).  Feel weak or numb.  Feel faint.  Have very bad pain in your: ? Chest. ? Belly (abdomen).  Throw up more than once.  Have trouble breathing. Summary  Hypertension is another name for high blood pressure.  High blood pressure forces your heart to work harder to pump blood.  For most people, a normal blood pressure is less than 120/80.  Making healthy choices can help lower blood pressure. If your blood pressure does not get lower with healthy choices, you may need to take medicine. This information is not intended to replace advice given to you by your health care provider. Make sure you discuss any questions you have with your health care provider. Document Revised: 05/21/2018 Document Reviewed: 05/21/2018 Elsevier Patient Education  [place].  Edema  Edema is when you have too much fluid in your body or under your skin. Edema may make your legs, feet, and ankles swell up. Swelling is also common in looser tissues, like around your eyes. This is a common condition. It gets more common as you get older. There are many possible causes of edema. Eating too much salt (sodium) and being  on your feet or sitting for a long time can cause edema in your legs, feet, and ankles. Hot weather may make edema worse. Edema is usually painless. Your skin may look swollen or shiny. Follow these instructions at home:  Keep the swollen body part raised (elevated) above the level of your heart when you are sitting or lying down.  Do not sit still or stand for a long time.  Do not wear tight clothes. Do not wear garters on your upper legs.  Exercise your legs. This can help the swelling go down.  Wear elastic bandages or support stockings as told by your doctor.  Eat a low-salt (low-sodium) diet to reduce fluid as told by your doctor.  Depending on the cause of your swelling, you may need to limit how much fluid you drink (fluid restriction).  Take over-the-counter and prescription medicines only as told by your doctor. Contact a doctor if:  Treatment is not working.  You  have heart, liver, or kidney disease and have symptoms of edema.  You have sudden and unexplained weight gain. Get help right away if:  You have shortness of breath or chest pain.  You cannot breathe when you lie down.  You have pain, redness, or warmth in the swollen areas.  You have heart, liver, or kidney disease and get edema all of a sudden.  You have a fever and your symptoms get worse all of a sudden. Summary  Edema is when you have too much fluid in your body or under your skin.  Edema may make your legs, feet, and ankles swell up. Swelling is also common in looser tissues, like around your eyes.  Raise (elevate) the swollen body part above the level of your heart when you are sitting or lying down.  Follow your doctor's instructions about diet and how much fluid you can drink (fluid restriction). This information is not intended to replace advice given to you by your health care provider. Make sure you discuss any questions you have with your health care provider. Document Revised: 09/13/2017  Document Reviewed: 09/28/2016 Elsevier Patient Education  2020 [employer].

## 2019-12-18 NOTE — Assessment & Plan Note (Signed)
Biotene oral mouth rinse recommended and sent to pharmacy.

## 2019-12-18 NOTE — Assessment & Plan Note (Addendum)
Not well controlled.  Flomax changed to Torsemide 20 mg daily and follow up in 2 weeks .  No labs drawn today.

## 2019-12-23 ENCOUNTER — Telehealth: Payer: Self-pay

## 2019-12-23 NOTE — Telephone Encounter (Signed)
[employer] sent a possible therapeutic duplication for you to review.  They are questioning the use of furosemide and toresemide.  Call with any changes to [phone].

## 2019-12-25 ENCOUNTER — Ambulatory Visit (INDEPENDENT_AMBULATORY_CARE_PROVIDER_SITE_OTHER): Payer: Medicare Other | Admitting: Nurse Practitioner

## 2019-12-25 ENCOUNTER — Other Ambulatory Visit: Payer: Self-pay

## 2019-12-25 ENCOUNTER — Encounter: Payer: Self-pay | Admitting: Nurse Practitioner

## 2019-12-25 VITALS — BP 162/98 | HR 76 | Temp 97.3°F | Ht 66.0 in | Wt 174.0 lb

## 2019-12-25 DIAGNOSIS — N3946 Mixed incontinence: Secondary | ICD-10-CM | POA: Insufficient documentation

## 2019-12-25 DIAGNOSIS — I10 Essential (primary) hypertension: Secondary | ICD-10-CM

## 2019-12-25 MED ORDER — OXYBUTYNIN CHLORIDE 5 MG PO TABS: ORAL_TABLET | ORAL | 2 refills | Status: DC

## 2019-12-25 MED ORDER — AMLODIPINE BESYLATE 10 MG PO TABS: 10.0000 mg | ORAL_TABLET | Freq: Every day | ORAL | 0 refills | Status: DC

## 2019-12-25 MED ORDER — TIZANIDINE HCL 4 MG PO TABS: 4.0000 mg | ORAL_TABLET | Freq: Two times a day (BID) | ORAL | 0 refills | Status: DC

## 2019-12-25 NOTE — Assessment & Plan Note (Addendum)
Patient hypertension is not well controlled, this is not a new problem. Education provided to patient and print out reading material.  Medication dosage increase from half pill to one whole.  amlodipine 10 mg daily, reduce salt, keep blood pressure log, follow up in two weeks, call blood pressure log in one week with Nurse.

## 2019-12-25 NOTE — Progress Notes (Addendum)
Established Patient Office Visit  Subjective:  Patient ID: Sonya Small, female    DOB: 10/23/1950  Age: 69 y.o. MRN: [id number]  CC: Patient  is a 69 year  old female. She is here for edema and uncontrolled hypertension. The patient's medications were reviewed and reconciled since the patient's last visit.  History details were provided by the patient. The history appears to be reliable.   Chief Complaint  Patient presents with  . Edema    1 week f/u    HPI [name] presents for bilateral edema. This is not a new problem. [name] states swelling is getting worse even with lasix 80 mg daily she stopped taking lasix for one week because she didn't want to get up at night to urinate.  Concerning her hypertension, [name] is tolerating the medication well without side effects. Patient states she would like to get off some of her medication. Compliance with treatment has been good; including taking medication as directed, maintains a healthy diet and regular exercise regimen, and following up as directed.  Past Medical History:  Diagnosis Date  . Anxiety   . CAD (coronary artery disease)    a. NSTEMI 11/2008 s/p DES to LCx (3.0x12 Xience); b. NSTEMI 01/2010 secondary to thrombotic RCA lesion (non-obstructive)-->med rx (integrilin x 24 hrs + plavix); c. 09/2012 negative Myoview.  . Chronic diastolic CHF (congestive heart failure) ([place])    a. 06/2014 Echo: EF 55-60%, no rwma, Gr1 DD, mild AI.  [name] Depression   . Dizziness   . Drug induced constipation   . Generalized hyperhidrosis   . GERD (gastroesophageal reflux disease)   . Headache   . Hyperlipidemia   . Hypertensive heart disease   . Lumbar disc disease   . Metabolic encephalopathy   . Mixed hyperlipidemia   . Myocardial infarction ([place])   . Obstructive sleep apnea   . OP (osteoporosis)   . Osteoarthritis   . Osteoporosis   . Other malaise   . Overweight(278.02)   . PAD (peripheral artery disease) ([place])    a. Emboli to R foot 2010  from partially occlusive lesion in R EIA, s/p stenting. - followed by Dr. Donnetta Hutching;  b. 10/2015 ABIs: R 1.03, L 0.97.  [name] Restless leg   . Sleep apnea   . Stroke ([place])   . TIA (transient ischemic attack)   . Tobacco abuse   . Urge incontinence     Past Surgical History:  Procedure Laterality Date  . EYE SURGERY     at age 72  . hysterectomy -age 39    . ILIAC ARTERY STENT     RIGHT ILIAC STENT  . KNEE ARTHROSCOPY    . LUMBAR LAMINECTOMY    . TUBAL LIGATION      Family History  Problem Relation Age of Onset  . Alzheimer's disease Mother   . Cancer Brother   . Heart disease Other        Grandfather  . Hepatitis C Brother   . Diabetes Brother   . Thyroid disease Brother   . Hypertension Brother   . Depression Brother     Social History   Socioeconomic History  . Marital status: Divorced    Spouse name: Not on file  . Number of children: 3  . Years of education: 10 th  . Highest education level: Not on file  Occupational History  . Occupation: DISABLED    Employer: UNEMPLOYED  Tobacco Use  . Smoking status: Current Every  Day Smoker    Packs/day: 0.50    Types: Cigarettes    Last attempt to quit: 11/20/2012    Years since quitting: 7.1  . Smokeless tobacco: Never Used  Substance and Sexual Activity  . Alcohol use: No    Alcohol/week: 0.0 standard drinks  . Drug use: No  . Sexual activity: Never  Other Topics Concern  . Not on file  Social History Narrative   Patient is single with 3 children, 1 deceased.   Patient is right handed.   Patient has 10 th grade education.   Patient drinks 5 or more cups daily.   Social Determinants of Health   Financial Resource Strain:   . Difficulty of Paying Living Expenses:   Food Insecurity:   . Worried About Charity fundraiser in the Last Year:   . Arboriculturist in the Last Year:   Transportation Needs:   . Film/video editor (Medical):   [name] Lack of Transportation (Non-Medical):   Physical Activity:   . Days of  Exercise per Week:   . Minutes of Exercise per Session:   Stress:   . Feeling of Stress :   Social Connections:   . Frequency of Communication with Friends and Family:   . Frequency of Social Gatherings with Friends and Family:   . Attends Religious Services:   . Active Member of Clubs or Organizations:   . Attends Archivist Meetings:   [name] Marital Status:   Intimate Partner Violence:   . Fear of Current or Ex-Partner:   . Emotionally Abused:   [name] Physically Abused:   . Sexually Abused:     Outpatient Medications Prior to Visit  Medication Sig Dispense Refill  . albuterol (PROVENTIL HFA;VENTOLIN HFA) 108 (90 BASE) MCG/ACT inhaler Inhale 2 puffs into the lungs every 4 (four) hours as needed for wheezing or shortness of breath.    . ALPRAZolam (XANAX) 0.5 MG tablet TAKE 1 TABLET BY MOUTH THREE TIMES A DAY 90 tablet 2  . antiseptic oral rinse (BIOTENE) LIQD 15 mLs by Mouth Rinse route as needed for dry mouth. 473 mL 0  . diclofenac Sodium (VOLTAREN) 1 % GEL     . fenofibrate 160 MG tablet TAKE ONE (1) TABLET ONCE DAILY 90 tablet 0  . fluticasone (FLONASE) 50 MCG/ACT nasal spray Place 1 spray into both nostrils daily.    . furosemide (LASIX) 40 MG tablet Take 80 mg by mouth daily.    [name] glucose blood (ONETOUCH VERIO) test strip USE DAILY TO CHECK YOUR BLOOD SUGAR (E11.69)    . lansoprazole (PREVACID) 30 MG capsule     . lisinopril (ZESTRIL) 5 MG tablet Take 10 mg by mouth daily.     . metoprolol tartrate (LOPRESSOR) 50 MG tablet Take 50 mg by mouth daily.     . nitroGLYCERIN (NITROSTAT) 0.4 MG SL tablet Place 1 tablet (0.4 mg total) under the tongue every 5 (five) minutes as needed. 25 tablet 1  . ONETOUCH VERIO test strip USE DAILY TO CHECK YOUR BLOOD SUGAR (E11.69)    . oxyCODONE-acetaminophen (PERCOCET) 10-325 MG tablet Take 1 tablet by mouth every 6 (six) hours as needed.    . pantoprazole (PROTONIX) 40 MG tablet Take 1 tablet (40 mg total) by mouth 2 (two) times daily. 180  tablet 3  . pregabalin (LYRICA) 50 MG capsule Take 50 capsules by mouth in the morning and at bedtime.    . rosuvastatin (CRESTOR) 20 MG tablet TAKE  ONE (1) TABLET BY MOUTH ONCE DAILY 90 tablet 1  . torsemide (DEMADEX) 20 MG tablet Take 1 tablet (20 mg total) by mouth daily. 30 tablet 0  . TRULICITY [id number] 0000000 SOPN INJECT 0.5ML BY SUBCUTANEOUS ROUTE EVERY7 DAYS IN THE ABDOMEN, THIGH, OR UPPER ARM ROTATING INJECTION SITES 6 mL 1  . VASCEPA 1 g CAPS Take 1-2 capsules by mouth See admin instructions. 2 capsules and 1 capsule at night    . venlafaxine XR (EFFEXOR-XR) 75 MG 24 hr capsule TAKE 1 CAPSULE BY MOUTH EVERY MORNING 90 capsule 1  . amLODipine (NORVASC) 10 MG tablet TAKE ONE-HALF TABLET DAILY 45 tablet 1  . clopidogrel (PLAVIX) 75 MG tablet Take 1 tablet (75 mg total) by mouth daily. 90 tablet 0  . oxybutynin (DITROPAN) 5 MG tablet Once daily in am. 30 tablet 3  . tiZANidine (ZANAFLEX) 4 MG tablet Take 4 mg by mouth in the morning and at bedtime.      No facility-administered medications prior to visit.    No Known Allergies  ROS Review of Systems  Constitutional: Negative for activity change and appetite change.  Respiratory: Negative for cough, chest tightness and shortness of breath.   Cardiovascular: Positive for leg swelling. Negative for chest pain and palpitations.  Gastrointestinal: Negative for abdominal pain.  Endocrine: Negative for cold intolerance and heat intolerance.  Genitourinary: Negative for difficulty urinating.  Musculoskeletal: Positive for arthralgias. Negative for joint swelling.  Skin: Positive for rash.       feet  Neurological: Negative for light-headedness.      Objective:    Physical Exam  Constitutional: She is oriented to person, place, and time. She appears well-developed and well-nourished.  HENT:  Mouth/Throat: Oropharynx is clear and moist.  Eyes: Conjunctivae are normal.  Cardiovascular: Normal rate, regular rhythm and normal heart  sounds.  Pulmonary/Chest: Breath sounds normal.  Abdominal: Bowel sounds are normal.  Musculoskeletal:        General: Tenderness present.     Cervical back: Neck supple.     Comments: Muscle aches and pain, back pain (prior back surgery) +2 bilateral lower extremity edma  Neurological: She is alert and oriented to person, place, and time.  Skin: Skin is warm. Rash noted.  Psychiatric: Her behavior is normal.    BP (!) 162/98 (BP Location: Left Arm, Patient Position: Sitting)   Pulse 76   Temp (!) 97.3 F (36.3 C) (Temporal)   Ht 5\' 6"  (1.676 m)   Wt 174 lb (78.9 kg)   SpO2 99%   BMI 28.08 kg/m  Wt Readings from Last 3 Encounters:  12/31/19 162 lb (73.5 kg)  12/25/19 174 lb (78.9 kg)  12/18/19 176 lb (79.8 kg)     Health Maintenance Due  Topic Date Due  . Hepatitis C Screening  Never done  . FOOT EXAM  Never done  . OPHTHALMOLOGY EXAM  Never done  . COVID-19 Vaccine (1) Never done  . TETANUS/TDAP  Never done  . MAMMOGRAM  Never done  . COLONOSCOPY  Never done  . DEXA SCAN  Never done  . PNA vac Low Risk Adult (1 of 2 - PCV13) 05/22/2014    There are no preventive care reminders to display for this patient.  Lab Results  Component Value Date   TSH 0.619 12/04/2019   Lab Results  Component Value Date   WBC 10.5 12/04/2019   HGB 15.5 12/04/2019   HCT 47.1 (H) 12/04/2019   MCV 88 12/04/2019  PLT 244 12/04/2019   Lab Results  Component Value Date   NA 147 (H) 12/04/2019   K 4.0 12/04/2019   CO2 27 12/04/2019   GLUCOSE 131 (H) 12/04/2019   BUN 13 12/04/2019   CREATININE 0.62 12/04/2019   BILITOT 0.2 12/04/2019   ALKPHOS 70 12/04/2019   AST 19 12/04/2019   ALT 16 12/04/2019   PROT 6.5 12/04/2019   ALBUMIN 4.3 12/04/2019   CALCIUM 9.2 12/04/2019   ANIONGAP 8 02/06/2019   Lab Results  Component Value Date   CHOL 167 12/04/2019   Lab Results  Component Value Date   HDL 49 12/04/2019   Lab Results  Component Value Date   LDLCALC 87 12/04/2019     Lab Results  Component Value Date   TRIG 180 (H) 12/04/2019   Lab Results  Component Value Date   CHOLHDL 3.4 12/04/2019   Lab Results  Component Value Date   HGBA1C 7.1 (H) 12/04/2019      Assessment & Plan:  Essential hypertension Education provided to patient. Medication dosage increase from half pill to one whole.  amlodipine 10 mg daily, reduce salt, keep blood pressure log, follow up in two weeks, call blood pressure log in one week with Nurse.  Problem List Items Addressed This Visit      Cardiovascular and Mediastinum   Essential hypertension - Primary    Education provided to patient. Medication dosage increase from half pill to one whole.  amlodipine 10 mg daily, reduce salt, keep blood pressure log, follow up in two weeks, call blood pressure log in one week with Nurse.      Relevant Medications   amLODipine (NORVASC) 10 MG tablet    Other Visit Diagnoses    Mixed stress and urge urinary incontinence       Relevant Medications   oxybutynin (DITROPAN) 5 MG tablet      Meds ordered this encounter  Medications  . amLODipine (NORVASC) 10 MG tablet    Sig: Take 1 tablet (10 mg total) by mouth daily.    Dispense:  30 tablet    Refill:  0    Order Specific Question:   Supervising Provider    AnswerRochel Brome [id number]  . tiZANidine (ZANAFLEX) 4 MG tablet    Sig: Take 1 tablet (4 mg total) by mouth in the morning and at bedtime.    Dispense:  30 tablet    Refill:  0    Order Specific Question:   Supervising Provider    AnswerRochel Brome [id number]  . DISCONTD: oxybutynin (DITROPAN) 5 MG tablet    Sig: Once daily in am.    Dispense:  30 tablet    Refill:  2    Order Specific Question:   Supervising Provider    AnswerShelton Silvas    Follow-up: Return in about 2 weeks (around 01/08/2020), or patient has follow appointment 12/31/19 with Dr [name].    Ivy Lynn, NP

## 2019-12-25 NOTE — Patient Instructions (Addendum)
Increase amlodipine to 10 mg daily, reduce salt, keep blood pressure log, follow up in two weeks, call blood pressure log in one week with Nurse.   Hypertension, Adult Hypertension is another name for high blood pressure. High blood pressure forces your heart to work harder to pump blood. This can cause problems over time. There are two numbers in a blood pressure reading. There is a top number (systolic) over a bottom number (diastolic). It is best to have a blood pressure that is below 120/80. Healthy choices can help lower your blood pressure, or you may need medicine to help lower it. What are the causes? The cause of this condition is not known. Some conditions may be related to high blood pressure. What increases the risk?  Smoking.  Having type 2 diabetes mellitus, high cholesterol, or both.  Not getting enough exercise or physical activity.  Being overweight.  Having too much fat, sugar, calories, or salt (sodium) in your diet.  Drinking too much alcohol.  Having long-term (chronic) kidney disease.  Having a family history of high blood pressure.  Age. Risk increases with age.  Race. You may be at higher risk if you are African American.  Gender. Men are at higher risk than women before age 20. After age 94, women are at higher risk than men.  Having obstructive sleep apnea.  Stress. What are the signs or symptoms?  High blood pressure may not cause symptoms. Very high blood pressure (hypertensive crisis) may cause: ? Headache. ? Feelings of worry or nervousness (anxiety). ? Shortness of breath. ? Nosebleed. ? A feeling of being sick to your stomach (nausea). ? Throwing up (vomiting). ? Changes in how you see. ? Very bad chest pain. ? Seizures. How is this treated?  This condition is treated by making healthy lifestyle changes, such as: ? Eating healthy foods. ? Exercising more. ? Drinking less alcohol.  Your health care provider may prescribe medicine if  lifestyle changes are not enough to get your blood pressure under control, and if: ? Your top number is above 130. ? Your bottom number is above 80.  Your personal target blood pressure may vary. Follow these instructions at home: Eating and drinking   If told, follow the DASH eating plan. To follow this plan: ? Fill one half of your plate at each meal with fruits and vegetables. ? Fill one fourth of your plate at each meal with whole grains. Whole grains include whole-wheat pasta, brown rice, and whole-grain bread. ? Eat or drink low-fat dairy products, such as skim milk or low-fat yogurt. ? Fill one fourth of your plate at each meal with low-fat (lean) proteins. Low-fat proteins include fish, chicken without skin, eggs, beans, and tofu. ? Avoid fatty meat, cured and processed meat, or chicken with skin. ? Avoid pre-made or processed food.  Eat less than 1,500 mg of salt each day.  Do not drink alcohol if: ? Your doctor tells you not to drink. ? You are pregnant, may be pregnant, or are planning to become pregnant.  If you drink alcohol: ? Limit how much you use to:  0-1 drink a day for women.  0-2 drinks a day for men. ? Be aware of how much alcohol is in your drink. In the [place] one drink equals one 12 oz bottle of beer (355 mL), one 5 oz glass of wine (148 mL), or one 1 oz glass of hard liquor (44 mL). Lifestyle   Work with your doctor to  stay at a healthy weight or to lose weight. Ask your doctor what the best weight is for you.  Get at least 30 minutes of exercise most days of the week. This may include walking, swimming, or biking.  Get at least 30 minutes of exercise that strengthens your muscles (resistance exercise) at least 3 days a week. This may include lifting weights or doing Pilates.  Do not use any products that contain nicotine or tobacco, such as cigarettes, e-cigarettes, and chewing tobacco. If you need help quitting, ask your doctor.  Check your blood  pressure at home as told by your doctor.  Keep all follow-up visits as told by your doctor. This is important. Medicines  Take over-the-counter and prescription medicines only as told by your doctor. Follow directions carefully.  Do not skip doses of blood pressure medicine. The medicine does not work as well if you skip doses. Skipping doses also puts you at risk for problems.  Ask your doctor about side effects or reactions to medicines that you should watch for. Contact a doctor if you:  Think you are having a reaction to the medicine you are taking.  Have headaches that keep coming back (recurring).  Feel dizzy.  Have swelling in your ankles.  Have trouble with your vision. Get help right away if you:  Get a very bad headache.  Start to feel mixed up (confused).  Feel weak or numb.  Feel faint.  Have very bad pain in your: ? Chest. ? Belly (abdomen).  Throw up more than once.  Have trouble breathing. Summary  Hypertension is another name for high blood pressure.  High blood pressure forces your heart to work harder to pump blood.  For most people, a normal blood pressure is less than 120/80.  Making healthy choices can help lower blood pressure. If your blood pressure does not get lower with healthy choices, you may need to take medicine. This information is not intended to replace advice given to you by your health care provider. Make sure you discuss any questions you have with your health care provider. Document Revised: 05/21/2018 Document Reviewed: 05/21/2018 Elsevier Patient Education  2020 [employer].

## 2019-12-28 NOTE — Telephone Encounter (Signed)
Please call patient and see if she is taking both. kc

## 2019-12-30 NOTE — Progress Notes (Signed)
Established Patient Office Visit  Subjective:  Patient ID: Sonya Small, female    DOB: 09-14-1951  Age: 69 y.o. MRN: [id number]  CC:  Chief Complaint  Patient presents with  . Depression    Vraylar 1.5 was added last visit.  . Diabetes    Patient states yesterday she felt like she was going to pass out.    HPI [name] presents for hypertensive heart disease. Her bp at home is 155/98. Denies chest pain. Has chronic stable dypsnea. Feels lightheaded. She is currently on amlodipine 10 mg once daily, metoprolol 50 mg once daily, and lisinopril 5 mg 2 daily.  Urge incontinence - started on oxybutynin 5 mg once daily which has helped, but not enough. Pt is asking if could increase dose.   Diabetes - sugars 131-163. Patient states yesterday she felt like she was going to pass out. She did not check her sugar at the time to see if it was low.   Past Medical History:  Diagnosis Date  . Anxiety   . CAD (coronary artery disease)    a. NSTEMI 11/2008 s/p DES to LCx (3.0x12 Xience); b. NSTEMI 01/2010 secondary to thrombotic RCA lesion (non-obstructive)-->med rx (integrilin x 24 hrs + plavix); c. 09/2012 negative Myoview.  . Chronic diastolic CHF (congestive heart failure) ([place])    a. 06/2014 Echo: EF 55-60%, no rwma, Gr1 DD, mild AI.  [name] Depression   . Dizziness   . Drug induced constipation   . Generalized hyperhidrosis   . GERD (gastroesophageal reflux disease)   . Headache   . Hyperlipidemia   . Hypertensive heart disease   . Lumbar disc disease   . Metabolic encephalopathy   . Mixed hyperlipidemia   . Myocardial infarction ([place])   . Obstructive sleep apnea   . OP (osteoporosis)   . Osteoarthritis   . Osteoporosis   . Other malaise   . Overweight(278.02)   . PAD (peripheral artery disease) ([place])    a. Emboli to R foot 2010 from partially occlusive lesion in R EIA, s/p stenting. - followed by Dr. Donnetta Hutching;  b. 10/2015 ABIs: R 1.03, L 0.97.  [name] Restless leg   . Sleep apnea   .  Stroke ([place])   . TIA (transient ischemic attack)   . Tobacco abuse   . Urge incontinence     Past Surgical History:  Procedure Laterality Date  . EYE SURGERY     at age 65  . hysterectomy -age 11    . ILIAC ARTERY STENT     RIGHT ILIAC STENT  . KNEE ARTHROSCOPY    . LUMBAR LAMINECTOMY    . TUBAL LIGATION      Family History  Problem Relation Age of Onset  . Alzheimer's disease Mother   . Cancer Brother   . Heart disease Other        Grandfather  . Hepatitis C Brother   . Diabetes Brother   . Thyroid disease Brother   . Hypertension Brother   . Depression Brother     Social History   Socioeconomic History  . Marital status: Divorced    Spouse name: Not on file  . Number of children: 3  . Years of education: 10 th  . Highest education level: Not on file  Occupational History  . Occupation: DISABLED    Employer: UNEMPLOYED  Tobacco Use  . Smoking status: Current Every Day Smoker    Packs/day: 0.50    Types: Cigarettes  Last attempt to quit: 11/20/2012    Years since quitting: 7.1  . Smokeless tobacco: Never Used  Substance and Sexual Activity  . Alcohol use: No    Alcohol/week: 0.0 standard drinks  . Drug use: No  . Sexual activity: Never  Other Topics Concern  . Not on file  Social History Narrative   Patient is single with 3 children, 1 deceased.   Patient is right handed.   Patient has 10 th grade education.   Patient drinks 5 or more cups daily.   Social Determinants of Health   Financial Resource Strain:   . Difficulty of Paying Living Expenses:   Food Insecurity:   . Worried About Charity fundraiser in the Last Year:   . Arboriculturist in the Last Year:   Transportation Needs:   . Film/video editor (Medical):   [name] Lack of Transportation (Non-Medical):   Physical Activity:   . Days of Exercise per Week:   . Minutes of Exercise per Session:   Stress:   . Feeling of Stress :   Social Connections:   . Frequency of Communication with  Friends and Family:   . Frequency of Social Gatherings with Friends and Family:   . Attends Religious Services:   . Active Member of Clubs or Organizations:   . Attends Archivist Meetings:   [name] Marital Status:   Intimate Partner Violence:   . Fear of Current or Ex-Partner:   . Emotionally Abused:   [name] Physically Abused:   . Sexually Abused:     Outpatient Medications Prior to Visit  Medication Sig Dispense Refill  . albuterol (PROVENTIL HFA;VENTOLIN HFA) 108 (90 BASE) MCG/ACT inhaler Inhale 2 puffs into the lungs every 4 (four) hours as needed for wheezing or shortness of breath.    . ALPRAZolam (XANAX) 0.5 MG tablet TAKE 1 TABLET BY MOUTH THREE TIMES A DAY 90 tablet 2  . amLODipine (NORVASC) 10 MG tablet Take 1 tablet (10 mg total) by mouth daily. 30 tablet 0  . antiseptic oral rinse (BIOTENE) LIQD 15 mLs by Mouth Rinse route as needed for dry mouth. 473 mL 0  . clopidogrel (PLAVIX) 75 MG tablet Take 1 tablet (75 mg total) by mouth daily. 90 tablet 0  . diclofenac Sodium (VOLTAREN) 1 % GEL     . fenofibrate 160 MG tablet TAKE ONE (1) TABLET ONCE DAILY 90 tablet 0  . fluticasone (FLONASE) 50 MCG/ACT nasal spray Place 1 spray into both nostrils daily.    . furosemide (LASIX) 40 MG tablet Take 80 mg by mouth daily.    [name] glucose blood (ONETOUCH VERIO) test strip USE DAILY TO CHECK YOUR BLOOD SUGAR (E11.69)    . lansoprazole (PREVACID) 30 MG capsule     . lisinopril (ZESTRIL) 5 MG tablet Take 10 mg by mouth daily.     . metoprolol tartrate (LOPRESSOR) 50 MG tablet Take 50 mg by mouth daily.     . nitroGLYCERIN (NITROSTAT) 0.4 MG SL tablet Place 1 tablet (0.4 mg total) under the tongue every 5 (five) minutes as needed. 25 tablet 1  . ONETOUCH VERIO test strip USE DAILY TO CHECK YOUR BLOOD SUGAR (E11.69)    . oxyCODONE-acetaminophen (PERCOCET) 10-325 MG tablet Take 1 tablet by mouth every 6 (six) hours as needed.    . pantoprazole (PROTONIX) 40 MG tablet Take 1 tablet (40 mg total)  by mouth 2 (two) times daily. 180 tablet 3  . pregabalin (LYRICA) 50  MG capsule Take 50 capsules by mouth in the morning and at bedtime.    . rosuvastatin (CRESTOR) 20 MG tablet TAKE ONE (1) TABLET BY MOUTH ONCE DAILY 90 tablet 1  . tiZANidine (ZANAFLEX) 4 MG tablet Take 1 tablet (4 mg total) by mouth in the morning and at bedtime. 30 tablet 0  . torsemide (DEMADEX) 20 MG tablet Take 1 tablet (20 mg total) by mouth daily. 30 tablet 0  . TRULICITY A999333 0000000 SOPN INJECT 0.5ML BY SUBCUTANEOUS ROUTE EVERY7 DAYS IN THE ABDOMEN, THIGH, OR UPPER ARM ROTATING INJECTION SITES 6 mL 1  . VASCEPA 1 g CAPS Take 1-2 capsules by mouth See admin instructions. 2 capsules and 1 capsule at night    . venlafaxine XR (EFFEXOR-XR) 75 MG 24 hr capsule TAKE 1 CAPSULE BY MOUTH EVERY MORNING 90 capsule 1  . oxybutynin (DITROPAN) 5 MG tablet Once daily in am. 30 tablet 2   No facility-administered medications prior to visit.    No Known Allergies  ROS Review of Systems  Constitutional: Negative for chills, fatigue and fever.  HENT: Negative for congestion, ear pain, rhinorrhea and sore throat.   Respiratory: Negative for cough and shortness of breath.   Cardiovascular: Negative for chest pain.  Gastrointestinal: Negative for abdominal pain, constipation, diarrhea, nausea and vomiting.  Genitourinary: Negative for dysuria and urgency.  Musculoskeletal: Negative for back pain and myalgias.  Skin: Positive for rash.  Neurological: Negative for dizziness, weakness, light-headedness and headaches.  Psychiatric/Behavioral: Negative for dysphoric mood. The patient is not nervous/anxious.        Arman Filter has helped.      Objective:    Physical Exam  Constitutional: She appears well-developed and well-nourished.  Cardiovascular: Normal rate, regular rhythm and normal heart sounds.  Pulmonary/Chest: Effort normal and breath sounds normal.  Neurological: She is alert.  Skin:  White 1-2 mm plaques over ankles,  chest, arms.   Psychiatric: She has a normal mood and affect. Her behavior is normal.    BP (!) 164/80 (BP Location: Right Arm, Patient Position: Sitting)   Pulse 77   Temp 97.6 F (36.4 C) (Temporal)   Ht 5\' 5"  (1.651 m)   Wt 162 lb (73.5 kg)   SpO2 99%   BMI 26.96 kg/m  Wt Readings from Last 3 Encounters:  12/31/19 162 lb (73.5 kg)  12/25/19 174 lb (78.9 kg)  12/18/19 176 lb (79.8 kg)     Health Maintenance Due  Topic Date Due  . Hepatitis C Screening  Never done  . FOOT EXAM  Never done  . OPHTHALMOLOGY EXAM  Never done  . COVID-19 Vaccine (1) Never done  . TETANUS/TDAP  Never done  . MAMMOGRAM  Never done  . COLONOSCOPY  Never done  . DEXA SCAN  Never done  . PNA vac Low Risk Adult (1 of 2 - PCV13) 05/22/2014    There are no preventive care reminders to display for this patient.  Lab Results  Component Value Date   TSH 0.619 12/04/2019   Lab Results  Component Value Date   WBC 10.5 12/04/2019   HGB 15.5 12/04/2019   HCT 47.1 (H) 12/04/2019   MCV 88 12/04/2019   PLT 244 12/04/2019   Lab Results  Component Value Date   NA 147 (H) 12/04/2019   K 4.0 12/04/2019   CO2 27 12/04/2019   GLUCOSE 131 (H) 12/04/2019   BUN 13 12/04/2019   CREATININE 0.62 12/04/2019   BILITOT 0.2 12/04/2019  ALKPHOS 70 12/04/2019   AST 19 12/04/2019   ALT 16 12/04/2019   PROT 6.5 12/04/2019   ALBUMIN 4.3 12/04/2019   CALCIUM 9.2 12/04/2019   ANIONGAP 8 02/06/2019   Lab Results  Component Value Date   CHOL 167 12/04/2019   Lab Results  Component Value Date   HDL 49 12/04/2019   Lab Results  Component Value Date   LDLCALC 87 12/04/2019   Lab Results  Component Value Date   TRIG 180 (H) 12/04/2019   Lab Results  Component Value Date   CHOLHDL 3.4 12/04/2019   Lab Results  Component Value Date   HGBA1C 7.1 (H) 12/04/2019     Assessment & Plan:  1. Hypertensive heart disease with chronic systolic congestive heart failure (HCC) Discontinue   Lisinopril. Start lisinopril/hctz 10/12.5 mg once daily. Continue amlodipine 10 mg once daily.  Continue metoprolol 50 mg once daily.   2. Mixed stress and urge urinary incontinence Increase oxybutynin. Cautioned because has vertigo already and this can cause some lightheadedness. - oxybutynin (DITROPAN) 5 MG tablet; One three times a day  Dispense: 90 tablet; Refill: 2 - Amb Referral to Clinical Pharmacist  3. Rash - refer to dermatology.   4. Depression, moderate, recurrent. Continue current medications.   5. Diabetes - continue trulicity A999333 mg once weekly. Monitor sugars daily.   Follow-up: Return in about 4 weeks (around 01/28/2020) for hypertension followup. [name], MD

## 2019-12-31 ENCOUNTER — Ambulatory Visit (INDEPENDENT_AMBULATORY_CARE_PROVIDER_SITE_OTHER): Payer: Medicare Other | Admitting: Family Medicine

## 2019-12-31 ENCOUNTER — Encounter: Payer: Self-pay | Admitting: Family Medicine

## 2019-12-31 ENCOUNTER — Other Ambulatory Visit: Payer: Self-pay

## 2019-12-31 VITALS — BP 164/80 | HR 77 | Temp 97.6°F | Ht 65.0 in | Wt 162.0 lb

## 2019-12-31 DIAGNOSIS — I110 Hypertensive heart disease with heart failure: Secondary | ICD-10-CM | POA: Diagnosis not present

## 2019-12-31 DIAGNOSIS — F331 Major depressive disorder, recurrent, moderate: Secondary | ICD-10-CM

## 2019-12-31 DIAGNOSIS — N3946 Mixed incontinence: Secondary | ICD-10-CM

## 2019-12-31 DIAGNOSIS — E1142 Type 2 diabetes mellitus with diabetic polyneuropathy: Secondary | ICD-10-CM

## 2019-12-31 DIAGNOSIS — I5022 Chronic systolic (congestive) heart failure: Secondary | ICD-10-CM

## 2019-12-31 DIAGNOSIS — L309 Dermatitis, unspecified: Secondary | ICD-10-CM

## 2019-12-31 MED ORDER — OXYBUTYNIN CHLORIDE 5 MG PO TABS: ORAL_TABLET | ORAL | 2 refills | Status: DC

## 2019-12-31 MED ORDER — LISINOPRIL-HYDROCHLOROTHIAZIDE 10-12.5 MG PO TABS: 1.0000 | ORAL_TABLET | Freq: Every day | ORAL | 0 refills | Status: DC

## 2019-12-31 NOTE — Patient Instructions (Signed)
Hypertension - uncontrolled. Discontinue  Lisinopril. Start lisinopril/hctz 10/12.5 mg once daily. Continue amlodipine 10 mg once daily.  Continue metoprolol 50 mg once daily.   Urge incontinence Increase oxybutynin 5 mg 2-3 times per day spread out.   Referral to Lysle Rubens, PHD for medication review.  Refer to dermatologist for Rash/abnormal skin spots

## 2020-01-04 ENCOUNTER — Telehealth: Payer: Self-pay | Admitting: Family Medicine

## 2020-01-04 NOTE — Progress Notes (Signed)
  Chronic Care Management   Note  01/04/2020 Name: Sonya Small MRN: [id number] DOB: 1951-07-24  Sonya Small is a 69 y.o. year old female who is a primary care patient of [name], [name], MD. I reached out to [name] by phone today in response to a referral sent by Sonya Small PCP, [name], MD.   Sonya Small was given information about Chronic Care Management services today including:  1. CCM service includes personalized support from designated clinical staff supervised by her physician, including individualized plan of care and coordination with other care providers 2. 24/7 contact phone numbers for assistance for urgent and routine care needs. 3. Service will only be billed when office clinical staff spend 20 minutes or more in a month to coordinate care. 4. Only one practitioner may furnish and bill the service in a calendar month. 5. The patient may stop CCM services at any time (effective at the end of the month) by phone call to the office staff.   Patient agreed to services and verbal consent obtained.   Follow up plan:   Earney Hamburg Upstream Scheduler

## 2020-01-04 NOTE — Progress Notes (Signed)
  Chronic Care Management   Note  01/04/2020 Name: Sonya Small MRN: [id number] DOB: 04-18-1951  Sonya Small is a 69 y.o. year old female who is a primary care patient of [name], [name], MD. I reached out to [name] by phone today in response to a referral sent by [name] PCP, [name], MD.   [name] was given information about Chronic Care Management services today including:  1. CCM service includes personalized support from designated clinical staff supervised by her physician, including individualized plan of care and coordination with other care providers 2. 24/7 contact phone numbers for assistance for urgent and routine care needs. 3. Service will only be billed when office clinical staff spend 20 minutes or more in a month to coordinate care. 4. Only one practitioner may furnish and bill the service in a calendar month. 5. The patient may stop CCM services at any time (effective at the end of the month) by phone call to the office staff.   Patient agreed to services and verbal consent obtained.   Follow up plan:   Earney Hamburg Upstream Scheduler

## 2020-01-11 ENCOUNTER — Telehealth: Payer: Self-pay | Admitting: Family Medicine

## 2020-01-11 NOTE — Telephone Encounter (Signed)
Sugars 150-180. Patient feeling poorly.  Recommend increase trulicity to 1.5 mg once weekly.  Pt to pick up samples.  Dr. [name]

## 2020-01-12 DIAGNOSIS — L309 Dermatitis, unspecified: Secondary | ICD-10-CM | POA: Insufficient documentation

## 2020-01-13 ENCOUNTER — Other Ambulatory Visit: Payer: Self-pay | Admitting: Family Medicine

## 2020-01-13 ENCOUNTER — Other Ambulatory Visit: Payer: Self-pay | Admitting: Legal Medicine

## 2020-01-14 ENCOUNTER — Other Ambulatory Visit: Payer: Self-pay | Admitting: Family Medicine

## 2020-01-14 MED ORDER — FUROSEMIDE 40 MG PO TABS: 80.0000 mg | ORAL_TABLET | Freq: Every day | ORAL | 2 refills | Status: DC

## 2020-01-14 NOTE — Chronic Care Management (AMB) (Signed)
Duplicate Note Created in Error

## 2020-01-18 ENCOUNTER — Telehealth: Payer: Self-pay

## 2020-01-18 NOTE — Telephone Encounter (Signed)
Enid Derry, [name], called to report that Sonya Small fell outside and hit her head pretty hard on a rock.  Patient is taking Plavix 75 mg daily.  It is recommended that she be evaluated in the ED to rule out any hidden head trauma.  She states there is no visible laceration to the head however patient complains of a headache.

## 2020-01-21 ENCOUNTER — Ambulatory Visit: Payer: Medicare Other

## 2020-01-22 ENCOUNTER — Other Ambulatory Visit: Payer: Self-pay

## 2020-01-22 ENCOUNTER — Ambulatory Visit: Payer: Medicare Other

## 2020-01-22 VITALS — BP 94/50 | HR 71

## 2020-01-22 DIAGNOSIS — I10 Essential (primary) hypertension: Secondary | ICD-10-CM

## 2020-01-22 DIAGNOSIS — E1169 Type 2 diabetes mellitus with other specified complication: Secondary | ICD-10-CM

## 2020-01-22 DIAGNOSIS — E781 Pure hyperglyceridemia: Secondary | ICD-10-CM

## 2020-01-22 NOTE — Patient Instructions (Addendum)
Visit Information  Thank you for your time discussing your medications. I look forward to working with you to achieve your health care goals. Below is a summary of what we talked about during our visit.   Goals Addressed            This Visit's Progress   . Pharmacy Care Plan - DM      . Pharmacy Care Plan - HTN       CARE PLAN ENTRY (see longitudinal plan of care for additional care plan information)  Current Barriers:  . Chronic disease support, education and care coordination needs related to hypertension.  . Current antihypertensive regimen: furosemide, lisinopril-hctz amlodipine . Previous antihypertensives tried: lisniopril, torsemide . Last practice recorded BP readings:  BP Readings from Last 3 Encounters:  12/31/19 (!) 164/80  12/25/19 (!) 162/98  12/18/19 (!) 182/72 .   [name] Current home BP readings: begin checking today 89/57 . Most recent eGFR/CrCl: No results found for: EGFR  No components found for: CRCL  Pharmacist Clinical Goal(s):  [name] Over the next 30 days, patient will work with PharmD and providers to optimize antihypertensive regimen  Interventions: . Inter-disciplinary care team collaboration (see longitudinal plan of care) . Comprehensive medication review performed; medication list updated in the electronic medical record.  . Hold dose of amlodipine over the weekend and monitor blood pressure closely. Call PharmD with any change.   Patient Self Care Activities:  . Patient will continue to check BP twice daily, document, and provide at future appointments . Patient will focus on medication adherence by enrolling in adherence packaging, administering medications as prescribed, and contacting the providers office for any concerns or questions.   Initial goal documentation        Sonya Small was given information about Chronic Care Management services today including:  1. CCM service includes personalized support from designated clinical staff supervised by  her physician, including individualized plan of care and coordination with other care providers 2. 24/7 contact phone numbers for assistance for urgent and routine care needs. 3. Standard insurance, coinsurance, copays and deductibles apply for chronic care management only during months in which we provide at least 20 minutes of these services. Most insurances cover these services at 100%, however patients may be responsible for any copay, coinsurance and/or deductible if applicable. This service may help you avoid the need for more expensive face-to-face services. 4. Only one practitioner may furnish and bill the service in a calendar month. 5. The patient may stop CCM services at any time (effective at the end of the month) by phone call to the office staff.  Patient agreed to services and verbal consent obtained.   Print copy of patient instructions provided.  Telephone follow up appointment with pharmacy team member scheduled for: 1 month  Sherre Poot, PharmD Clinical Pharmacist [name] [phone]'  Exercises To Do While Sitting  Exercises that you do while sitting (chair exercises) can give you many of the same benefits as full exercise. Benefits include strengthening your heart, burning calories, and keeping muscles and joints healthy. Exercise can also improve your mood and help with depression and anxiety. You may benefit from chair exercises if you are unable to do standing exercises because of:  Diabetic foot pain.  Obesity.  Illness.  Arthritis.  Recovery from surgery or injury.  Breathing problems.  Balance problems.  Another type of disability. Before starting chair exercises, check with your health care provider or a physical therapist to find  out how much exercise you can tolerate and which exercises are safe for you. If your health care provider approves:  Start out slowly and build up over time. Aim to work up to about 10-20 minutes for each  exercise session.  Make exercise part of your daily routine.  Drink water when you exercise. Do not wait until you are thirsty. Drink every 10-15 minutes.  Stop exercising right away if you have pain, nausea, shortness of breath, or dizziness.  If you are exercising in a wheelchair, make sure to lock the wheels.  Ask your health care provider whether you can do tai chi or yoga. Many positions in these mind-body exercises can be modified to do while seated. Warm-up Before starting other exercises: 1. Sit up as straight as you can. Have your knees bent at 90 degrees, which is the shape of the capital letter "L." Keep your feet flat on the floor. 2. Sit at the front edge of your chair, if you can. 3. Pull in (tighten) the muscles in your abdomen and stretch your spine and neck as straight as you can. Hold this position for a few minutes. 4. Breathe in and out evenly. Try to concentrate on your breathing, and relax your mind. Stretching Exercise A: Arm stretch 1. Hold your arms out straight in front of your body. 2. Bend your hands at the wrist with your fingers pointing up, as if signaling someone to stop. Notice the slight tension in your forearms as you hold the position. 3. Keeping your arms out and your hands bent, rotate your hands outward as far as you can and hold this stretch. Aim to have your thumbs pointing up and your pinkie fingers pointing down. Slowly repeat arm stretches for one minute as tolerated. Exercise B: Leg stretch 1. If you can move your legs, try to "draw" letters on the floor with the toes of your foot. Write your name with one foot. 2. Write your name with the toes of your other foot. Slowly repeat the movements for one minute as tolerated. Exercise C: Reach for the sky 1. Reach your hands as far over your head as you can to stretch your spine. 2. Move your hands and arms as if you are climbing a rope. Slowly repeat the movements for one minute as  tolerated. Range of motion exercises Exercise A: Shoulder roll 1. Let your arms hang loosely at your sides. 2. Lift just your shoulders up toward your ears, then let them relax back down. 3. When your shoulders feel loose, rotate your shoulders in backward and forward circles. Do shoulder rolls slowly for one minute as tolerated. Exercise B: March in place 1. As if you are marching, pump your arms and lift your legs up and down. Lift your knees as high as you can. ? If you are unable to lift your knees, just pump your arms and move your ankles and feet up and down. March in place for one minute as tolerated. Exercise C: Seated jumping jacks 1. Let your arms hang down straight. 2. Keeping your arms straight, lift them up over your head. Aim to point your fingers to the ceiling. 3. While you lift your arms, straighten your legs and slide your heels along the floor to your sides, as wide as you can. 4. As you bring your arms back down to your sides, slide your legs back together. ? If you are unable to use your legs, just move your arms. Slowly repeat seated  jumping jacks for one minute as tolerated. Strengthening exercises Exercise A: Shoulder squeeze 1. Hold your arms straight out from your body to your sides, with your elbows bent and your fists pointed at the ceiling. 2. Keeping your arms in the bent position, move them forward so your elbows and forearms meet in front of your face. 3. Open your arms back out as wide as you can with your elbows still bent, until you feel your shoulder blades squeezing together. Hold for 5 seconds. Slowly repeat the movements forward and backward for one minute as tolerated. Contact a health care provider if you:  Had to stop exercising due to any of the following: ? Pain. ? Nausea. ? Shortness of breath. ? Dizziness. ? Fatigue.  Have significant pain or soreness after exercising. Get help right away if you have:  Chest pain.  Difficulty  breathing. These symptoms may represent a serious problem that is an emergency. Do not wait to see if the symptoms will go away. Get medical help right away. Call your local emergency services (911 in the [place] Do not drive yourself to the hospital. This information is not intended to replace advice given to you by your health care provider. Make sure you discuss any questions you have with your health care provider. Document Revised: 01/01/2019 Document Reviewed: 07/24/2017 Elsevier Patient Education  2020 [employer].

## 2020-01-22 NOTE — Chronic Care Management (AMB) (Signed)
Chronic Care Management Pharmacy  Name: CAREL SCHNEE  MRN: [id number] DOB: 19-Aug-1951  Chief Complaint/ HPI  Sonya Small,  69 y.o. , female presents for their Initial CCM visit with the clinical pharmacist via telephone due to COVID-19 Pandemic.  PCP : [name], MD  Their chronic conditions include: Chronic Diastolic Heart Failure, CAD, PAD, HTN, Hx of stroke, COPD, DM, HLD, Depression, Urge incontinence.   Office Visits: 01/11/2020 - Dr. [name] increased trulicity to 1.5 mg weekly and patient to pick up samples. Changed torsemide to furosemide.  12/31/2019 - Vraylar 1.5 mg added at last visit but not tolerated. Changed to lisinopril 10/12.5 mg daily, increased oxybutynin 5 mg to tid. 12/25/2019 - HTN elevated and edema x1 week at visit. Given amlodipine 10 mg daily, reduce salt, keep blood pressure log, follow up in two weeks, call blood pressure log in one week with Nurse. 12/18/2019 - Furosemide changed to torsemide 20 mg daily. 12/14/2019 - changed PPI to pantoprazole due to interaction with Plavix. 12/07/2019 - Vraylar changed to lamotrigine due to horrible nightmares. 12/04/2019 - Vraylar added. BP in office 136/70   Consult Visit: 01/18/2020 - Minor head injury with concussion. Patient went to ED after falling on a rock at home.  11/19/2019 - Cardiologist visit with Dr. [name]. BP well controlled. Asymptomatic CAD - continue aspirin and Plavix.  10/29/2019 - Patient experienced vertigo and nausea then fell. PCP referred her to ED.   Medications: Outpatient Encounter Medications as of 01/22/2020  Medication Sig Note  . albuterol (PROVENTIL HFA;VENTOLIN HFA) 108 (90 BASE) MCG/ACT inhaler Inhale 2 puffs into the lungs every 4 (four) hours as needed for wheezing or shortness of breath.   . ALPRAZolam (XANAX) 0.5 MG tablet TAKE 1 TABLET BY MOUTH THREE TIMES A DAY   . amLODipine (NORVASC) 10 MG tablet Take 1 tablet (10 mg total) by mouth daily.   . clopidogrel (PLAVIX) 75 MG  tablet TAKE ONE (1) TABLET BY MOUTH ONCE DAILY   . Dulaglutide (TRULICITY) 1.5 HW/8.0SU SOPN Inject 1.5 mg into the skin once a week.   . fenofibrate 160 MG tablet TAKE ONE (1) TABLET ONCE DAILY   . fluticasone (FLONASE) 50 MCG/ACT nasal spray Place 1 spray into both nostrils daily.   . Fluticasone-Salmeterol,sensor, (AIRDUO DIGIHALER) 113-14 MCG/ACT AEPB Inhale 1 puff into the lungs in the morning and at bedtime.   . furosemide (LASIX) 40 MG tablet Take 2 tablets (80 mg total) by mouth daily.   [name] glucose blood (ONETOUCH VERIO) test strip USE DAILY TO CHECK YOUR BLOOD SUGAR (E11.69)   . lamoTRIgine (LAMICTAL) 25 MG tablet TAKE 1 TABLET DAILY FOR 7 DAYS, THEN INCREASE TO 2 TABLETS DAILY (Patient taking differently: Take 50 mg by mouth daily. )   . lansoprazole (PREVACID) 30 MG capsule Take 30 mg by mouth daily.    [name] lisinopril-hydrochlorothiazide (ZESTORETIC) 10-12.5 MG tablet Take 1 tablet by mouth daily.   . metoprolol tartrate (LOPRESSOR) 50 MG tablet Take 50 mg by mouth daily.  02/02/2019: LF 12/24/18  . ONETOUCH VERIO test strip USE DAILY TO CHECK YOUR BLOOD SUGAR (E11.69)   . oxybutynin (DITROPAN) 5 MG tablet One three times a day   . oxyCODONE-acetaminophen (PERCOCET) 10-325 MG tablet Take 1 tablet by mouth every 6 (six) hours as needed.   . pregabalin (LYRICA) 50 MG capsule Take 50 capsules by mouth in the morning and at bedtime.   . rosuvastatin (CRESTOR) 20 MG tablet TAKE ONE (1) TABLET BY MOUTH  ONCE DAILY   . tiZANidine (ZANAFLEX) 4 MG tablet Take 1 tablet (4 mg total) by mouth in the morning and at bedtime.   [name] VASCEPA 1 g capsule TAKE 2 CAPSULES TWICE A DAY WITH FOOD   . venlafaxine XR (EFFEXOR-XR) 75 MG 24 hr capsule TAKE 1 CAPSULE BY MOUTH EVERY MORNING   . antiseptic oral rinse (BIOTENE) LIQD 15 mLs by Mouth Rinse route as needed for dry mouth. (Patient not taking: Reported on 01/22/2020)   . diclofenac Sodium (VOLTAREN) 1 % GEL    . nitroGLYCERIN (NITROSTAT) 0.4 MG SL tablet Place 1  tablet (0.4 mg total) under the tongue every 5 (five) minutes as needed.   . pantoprazole (PROTONIX) 40 MG tablet Take 1 tablet (40 mg total) by mouth 2 (two) times daily. (Patient not taking: Reported on 01/22/2020)   . TRULICITY 6.31 SH/7.71YO SOPN INJECT 0.5ML BY SUBCUTANEOUS ROUTE EVERY7 DAYS IN THE ABDOMEN, THIGH, OR UPPER ARM ROTATING INJECTION SITES (Patient not taking: Reported on 01/22/2020)    No facility-administered encounter medications on file as of 01/22/2020.   No Known Allergies  SDOH Screenings   Alcohol Screen:   . Last Alcohol Screening Score (AUDIT):   Depression (PHQ2-9): Medium Risk  . PHQ-2 Score: 13  Financial Resource Strain:   . Difficulty of Paying Living Expenses:   Food Insecurity:   . Worried About Charity fundraiser in the Last Year:   . [employer] of Food in the Last Year:   Housing: [place]   . Last Housing Risk Score: 0  Physical Activity:   . Days of Exercise per Week:   . Minutes of Exercise per Session:   Social Connections:   . Frequency of Communication with Friends and Family:   . Frequency of Social Gatherings with Friends and Family:   . Attends Religious Services:   . Active Member of Clubs or Organizations:   . Attends Archivist Meetings:   [name] Marital Status:   Stress: Stress Concern Present  . Feeling of Stress : Rather much  Tobacco Use: High Risk  . Smoking Tobacco Use: Current Every Day Smoker  . Smokeless Tobacco Use: Never Used  Transportation Needs: No Transportation Needs  . Lack of Transportation (Medical): No  . Lack of Transportation (Non-Medical): No      Current Diagnosis/Assessment:  Goals Addressed            This Visit's Progress   . Pharmacy Care Plan - DM      . Pharmacy Care Plan - HTN       CARE PLAN ENTRY (see longitudinal plan of care for additional care plan information)  Current Barriers:  . Chronic disease support, education and care coordination needs related to hypertension.   . Current antihypertensive regimen: furosemide, lisinopril-hctz amlodipine . Previous antihypertensives tried: lisniopril, torsemide . Last practice recorded BP readings:  BP Readings from Last 3 Encounters:  12/31/19 (!) 164/80  12/25/19 (!) 162/98  12/18/19 (!) 182/72 .   [name] Current home BP readings: begin checking today 89/57 . Most recent eGFR/CrCl: No results found for: EGFR  No components found for: CRCL  Pharmacist Clinical Goal(s):  [name] Over the next 30 days, patient will work with PharmD and providers to optimize antihypertensive regimen  Interventions: . Inter-disciplinary care team collaboration (see longitudinal plan of care) . Comprehensive medication review performed; medication list updated in the electronic medical record.  . Hold dose of amlodipine over the weekend and monitor blood  pressure closely. Call PharmD with any change.   Patient Self Care Activities:  . Patient will continue to check BP twice daily, document, and provide at future appointments . Patient will focus on medication adherence by enrolling in adherence packaging, administering medications as prescribed, and contacting the providers office for any concerns or questions.   Initial goal documentation        COPD/Tobacco Abuse   Eosinophil count:   Lab Results  Component Value Date/Time   EOSPCT 2 02/06/2019 04:32 AM  %                               Eos (Absolute):  Lab Results  Component Value Date/Time   EOSABS 0.1 12/04/2019 10:20 AM    Tobacco Status:  Social History   Tobacco Use  Smoking Status Current Every Day Smoker  . Packs/day: 0.50  . Types: Cigarettes  . Last attempt to quit: 11/20/2012  . Years since quitting: 7.1  Smokeless Tobacco Never Used    Patient has failed these meds in past: trelegy ellita 100-62.5-25, tudorza pressair, advair 500-50, tessalon 200 mg capsules, breo ellita, singulair 10 mg daily, duoneb prn Patient is currently controlled on the following  medications: flonase nasal spray daily, albuterol hfa prn, airduo digihaler 113 mcg - 1 puff twice daily Using maintenance inhaler regularly? No Frequency of rescue inhaler use:  daily  We discussed:  smoking cessation. Patient is supposed to use digihaler twice daily but using PRN. Talked to patient about using twice daily.Patient is still smoking at this time.   Plan  Continue current medications   Hyperlipidemia   Lipid Panel     Component Value Date/Time   CHOL 167 12/04/2019 1020   TRIG 180 (H) 12/04/2019 1020   HDL 49 12/04/2019 1020   CHOLHDL 3.4 12/04/2019 1020   CHOLHDL 5.0 07/13/2014 0108   VLDL 42 (H) 07/13/2014 0108   LDLCALC 87 12/04/2019 1020   LABVLDL 31 12/04/2019 1020     The ASCVD Risk score (Goff DC Jr., et al., 2013) failed to calculate for the following reasons:   The patient has a prior MI or stroke diagnosis   Patient has failed these meds in past: pravastatin Patient is currently uncontrolled on the following medications: vascepa 1 g, fenofibrate 160 mg daily, plavix 75 mg, rosuvastatin 20 mg   We discussed:  diet and exercise extensively. Triglycerides are still elevated with last blood work. Patient's sister reports that patient does not exercise as she should and lives a very sedentary life. Patient reports baking her chicken vs. frying.   Plan  Continue current medications  ,  Diabetes   Recent Relevant Labs: Lab Results  Component Value Date/Time   HGBA1C 7.1 (H) 12/04/2019 10:20 AM   HGBA1C 7.6 (H) 02/01/2019 04:37 AM   MICROALBUR 30 12/04/2019 10:43 AM    Lab Results  Component Value Date   CREATININE 0.62 12/04/2019   CREATININE 0.71 02/06/2019   CREATININE 0.76 02/06/2019   Checking BG: Daily  Recent FBG Readings: 154 this am (185, 163, 191, 160 past few days) Patient has failed these meds in past: farxiga 5 mg, metformin xr 750 mg, janumet 50-500 mg bid. Patient is currently uncontrolled on the following medications: trulicity  1.5 mg weekly,  Last diabetic Foot exam: 12/16/2019 Last diabetic Eye exam: Can't recall  We discussed: diet and exercise extensively. Wants to sleep all day when she wakes up and  her blood sugar is 130. Her highest in the past month is 195 mg/dL. She indicates that she is checking this am. Takes Trulicity shot on Wednesdays. Walks with her cane and her dog. Recommended patient see the eye doctor for an exam. Patient will remove shoes for Dr. [name] to check feet at next visit.  Plan  Continue current medications,  Hypertension   BP today is:  94/50 mmHg  Office blood pressures are  BP Readings from Last 3 Encounters:  12/31/19 (!) 164/80  12/25/19 (!) 162/98  12/18/19 (!) 182/72   Patient has failed these meds in the past: hctz, torsemide, lisinopril Patient is currently uncontrolled on the following medications: amlodipine 10 mg daily, furosemide 40 mg twice daily, lisinopril- hctz 10-12.5 mg, metoprolol tartrate 50 mg  Patient checks BP at home daily  Patient home BP readings are ranging: 94/50 mmHg  We discussed diet and exercise extensively. Patient does all the cooking. She said she doesn't eat much and doesn't salt. Likes to eat broccoli and cheese, chicken baked in oven, Brother comes to eat every day with patient and her sister. Cooks pinto beans and green beans. Rinses chicken noodle soup because of how salty is.  Patient's blood pressure is low in office today. Patient reports that her head feels unclear but attributed that to resolving concussion. Patient's medications were reviewed thoroughly in office today. Her medication routine at home seems somewhat unclear. Patient to be enrolled in packaging to improve consistency of routine. Patient's discontinued medications removed from regular medicine and taken for proper disposal.    Plan  Hold amlodipine until Monday. Check bp twice daily over the weekend. Pharmacist will check with patient on Monday for updated readings. Call  Pharmacist over the weekend if continue to be registering low.  GERD   Patient has failed these meds in past: nexium 40 mg, prevacid Patient is currently controlled on the following medications: lansoprazole 30 mg  We discussed: Nexium worked better than prevacid. Prevacid is prescribed twice daily but patient takes daily. Takes tums as needed. Burning in stomach often. Discussed options like avoiding tight clothing, elevating head when sleeping and avoiding spicy, trigger foods.   Plan  Continue current medications   Urge Incontinence   Patient has failed these meds in past: vesicare 10 mg, myrbetriq 50 mg Patient is currently controlled on the following medications: oxybutynin 5 mg tid  We discussed:  Gets up 3-4 times each night to use the bathroom. Symptoms are not fully controlled. Patient cannot remember trying anything else in the past.   Plan  Continue current medications     and  Depression   Patient has failed these meds in past: wellbutrin 300 mg , citaloram 20 mg, imipramine 50 mg, zoloft 100 mg Patient is currently uncontrolled on the following medications: alprazolam 0.5 mg tid, venlafaxine xr 75 mg am, lamotrigine 25 mg daily - 2 tablets daily  We discussed:  Encouraged getting outdoors and increasing activity to assist in improving mood/anxiety.  Plan  Continue current medications   Back Pain   Patient has failed these meds in past: tramadol, goody's body pain, hydrocodone-apap 5-325 mg, baclofen 10 mg, lamotrigine 25 mg, methocarbamol 500 mg, voltaren Patient is currently controlled on the following medications: oxycodone-apap 10-325 mg q6h prn, lyrica 50 mg qhs, tizanidine 4 mg qhs, goody's back and body daily.  We discussed:  Patient states that [facility] is filling tizanidine but working on Prior authorization. Patient has history of back pain and surgery.  Pharmacist counseled patient on dangers of using goody powders daily. Patient's sister reports she has  done it for years and it is a habit at this point. Cautioned on the dangers associated dangers of daily aspiring use. Encouraged patient try tylenol in it's place.   Plan  Continue current medications. Recommend patient try daily tylenol in place of Goody's powder.   Health Maintenance    Patient is currently controlled on the following medications:  Benadryl 25 mg daily for allergies Vitamin b12 and Vitamin D daily for general health  We discussed:  Patient's sister reports that patient has drowsiness and sleeps a lot during the day.  Last TSH 12/04/2019 is WNL.  Patient has been taking daily diphenhydramine for allergies. Pharmacist recommended trying daily loratadine to avoid dry mouth and daytime sleepiness. Plan  Recommend daily loratadine OTC vs. Diphenhydramine.    Vaccines   Reviewed and discussed patient's vaccination history.  Patient is not interested in a COVID vaccine at this time. She has family that has gotten the vaccine but prefers not to at this time.   Immunization History  Administered Date(s) Administered  . Influenza,inj,Quad PF,6+ Mos 07/13/2014  . Influenza-Unspecified 05/29/2019  . Pneumococcal Polysaccharide-23 06/26/2011    Plan  Recommended patient receive annual flu vaccine in office.   Medication Management   Pt uses [facility] for all medications Uses pill box? No - has them in the bottles and orgaznies them by time of day on top of bottle.It is the way she feels best about it.  Pt endorses some episdoes of non compliance. She mentions that she skipped her medications over the weekend and felt better than when she takes them.   We discussed: Packaging to improve adherence.   Verbal consent obtained for [facility] enhanced pharmacy services (medication synchronization, adherence packaging, delivery coordination). A medication sync plan was created to allow patient to get all medications delivered once every 30 to 90 days per  patient preference. Patient understands they have freedom to choose pharmacy and clinical pharmacist will coordinate care between all prescribers and [facility].    Plan  Utilize [facility] for medication synchronization, packaging and delivery    Follow up: 1 month phone visit

## 2020-01-25 ENCOUNTER — Other Ambulatory Visit: Payer: Self-pay

## 2020-01-25 ENCOUNTER — Ambulatory Visit: Payer: Self-pay

## 2020-01-25 DIAGNOSIS — I10 Essential (primary) hypertension: Secondary | ICD-10-CM

## 2020-01-25 DIAGNOSIS — E1169 Type 2 diabetes mellitus with other specified complication: Secondary | ICD-10-CM

## 2020-01-25 DIAGNOSIS — N3946 Mixed incontinence: Secondary | ICD-10-CM

## 2020-01-25 MED ORDER — LANSOPRAZOLE 30 MG PO CPDR: 30.0000 mg | DELAYED_RELEASE_CAPSULE | Freq: Every day | ORAL | 1 refills | Status: DC

## 2020-01-25 MED ORDER — FLUTICASONE PROPIONATE 50 MCG/ACT NA SUSP: 1.0000 | Freq: Every day | NASAL | 1 refills | Status: DC

## 2020-01-25 MED ORDER — LAMOTRIGINE 25 MG PO TABS: 50.0000 mg | ORAL_TABLET | Freq: Every day | ORAL | 1 refills | Status: DC

## 2020-01-25 MED ORDER — VENLAFAXINE HCL ER 75 MG PO CP24: 75.0000 mg | ORAL_CAPSULE | Freq: Every morning | ORAL | 1 refills | Status: DC

## 2020-01-25 MED ORDER — ICOSAPENT ETHYL 1 G PO CAPS: ORAL_CAPSULE | ORAL | 1 refills | Status: DC

## 2020-01-25 MED ORDER — TIZANIDINE HCL 4 MG PO TABS: 4.0000 mg | ORAL_TABLET | Freq: Two times a day (BID) | ORAL | 1 refills | Status: DC

## 2020-01-25 MED ORDER — OXYBUTYNIN CHLORIDE 5 MG PO TABS: ORAL_TABLET | ORAL | 1 refills | Status: DC

## 2020-01-25 MED ORDER — FUROSEMIDE 40 MG PO TABS: 40.0000 mg | ORAL_TABLET | Freq: Two times a day (BID) | ORAL | 1 refills | Status: DC

## 2020-01-25 MED ORDER — PREGABALIN 50 MG PO CAPS: 50.0000 mg | ORAL_CAPSULE | Freq: Two times a day (BID) | ORAL | 1 refills | Status: DC

## 2020-01-25 MED ORDER — CLOPIDOGREL BISULFATE 75 MG PO TABS: ORAL_TABLET | ORAL | 1 refills | Status: DC

## 2020-01-25 MED ORDER — FENOFIBRATE 160 MG PO TABS: ORAL_TABLET | ORAL | 1 refills | Status: DC

## 2020-01-25 MED ORDER — ROSUVASTATIN CALCIUM 20 MG PO TABS: ORAL_TABLET | ORAL | 1 refills | Status: DC

## 2020-01-25 MED ORDER — NITROGLYCERIN 0.4 MG SL SUBL: 0.4000 mg | SUBLINGUAL_TABLET | SUBLINGUAL | 1 refills | Status: DC | PRN

## 2020-01-25 MED ORDER — LISINOPRIL-HYDROCHLOROTHIAZIDE 10-12.5 MG PO TABS: 1.0000 | ORAL_TABLET | Freq: Every day | ORAL | 1 refills | Status: DC

## 2020-01-25 MED ORDER — ALBUTEROL SULFATE HFA 108 (90 BASE) MCG/ACT IN AERS: 2.0000 | INHALATION_SPRAY | RESPIRATORY_TRACT | 2 refills | Status: DC | PRN

## 2020-01-25 MED ORDER — TRULICITY 1.5 MG/0.5ML ~~LOC~~ SOAJ: 1.5000 mg | SUBCUTANEOUS | 1 refills | Status: DC

## 2020-01-25 MED ORDER — ONETOUCH VERIO VI STRP: ORAL_STRIP | 3 refills | Status: DC

## 2020-01-25 NOTE — Patient Instructions (Signed)
Visit Information  Goals Addressed            This Visit's Progress   . Pharmacy Care Plan - HTN       CARE PLAN ENTRY (see longitudinal plan of care for additional care plan information)  Current Barriers:  . Chronic disease support, education and care coordination needs related to hypertension.  . Current antihypertensive regimen: furosemide, lisinopril-hctz amlodipine . Previous antihypertensives tried: lisniopril, torsemide . Last practice recorded BP readings:  BP Readings from Last 3 Encounters:  12/31/19 (!) 164/80  12/25/19 (!) 162/98  12/18/19 (!) 182/72 .   [name] Current home BP readings: begin checking today 89/57 . Most recent eGFR/CrCl: No results found for: EGFR  No components found for: CRCL  Pharmacist Clinical Goal(s):  [name] Over the next 30 days, patient will work with PharmD and providers to optimize antihypertensive regimen  Interventions: . Inter-disciplinary care team collaboration (see longitudinal plan of care) . Comprehensive medication review performed; medication list updated in the electronic medical record.  . Continue to check blood pressure twice daily and hold amlodipine. Call PharmD with any change.   Patient Self Care Activities:  . Patient will continue to check BP twice daily, document, and provide at future appointments . Patient will focus on medication adherence by enrolling in adherence packaging, administering medications as prescribed, and contacting the providers office for any concerns or questions.   Initial goal documentation        The patient verbalized understanding of instructions provided today and declined a print copy of patient instruction materials.   Face to Face appointment with pharmacist scheduled for: 02/18/2020  Sherre Poot, PharmD, [facility] Clinical Pharmacist [name] [phone]

## 2020-01-25 NOTE — Chronic Care Management (AMB) (Signed)
Chronic Care Management Pharmacy  Name: Sonya Small  MRN: [id number] DOB: 1951/07/30  Chief Complaint/ HPI  Sonya Small,  69 y.o. , female presents for their Initial CCM visit with the clinical pharmacist via telephone due to COVID-19 Pandemic.  PCP : [name], MD  Their chronic conditions include: Chronic Diastolic Heart Failure, CAD, PAD, HTN, Hx of stroke, COPD, DM, HLD, Depression, Urge incontinence.   Office Visits: 01/11/2020 - Dr. [name] increased trulicity to 1.5 mg weekly and patient to pick up samples. Changed torsemide to furosemide.  12/31/2019 - Vraylar 1.5 mg added at last visit but not tolerated. Changed to lisinopril 10/12.5 mg daily, increased oxybutynin 5 mg to tid. 12/25/2019 - HTN elevated and edema x1 week at visit. Given amlodipine 10 mg daily, reduce salt, keep blood pressure log, follow up in two weeks, call blood pressure log in one week with Nurse. 12/18/2019 - Furosemide changed to torsemide 20 mg daily. 12/14/2019 - changed PPI to pantoprazole due to interaction with Plavix. 12/07/2019 - Vraylar changed to lamotrigine due to horrible nightmares. 12/04/2019 - Vraylar added. BP in office 136/70   Consult Visit: 01/18/2020 - Minor head injury with concussion. Patient went to ED after falling on a rock at home.  11/19/2019 - Cardiologist visit with Dr. [name]. BP well controlled. Asymptomatic CAD - continue aspirin and Plavix.  10/29/2019 - Patient experienced vertigo and nausea then fell. PCP referred her to ED.   Medications: Outpatient Encounter Medications as of 01/25/2020  Medication Sig Note  . albuterol (PROVENTIL HFA;VENTOLIN HFA) 108 (90 BASE) MCG/ACT inhaler Inhale 2 puffs into the lungs every 4 (four) hours as needed for wheezing or shortness of breath.   . ALPRAZolam (XANAX) 0.5 MG tablet TAKE 1 TABLET BY MOUTH THREE TIMES A DAY   . amLODipine (NORVASC) 10 MG tablet Take 1 tablet (10 mg total) by mouth daily.   [name] antiseptic oral rinse (BIOTENE)  LIQD 15 mLs by Mouth Rinse route as needed for dry mouth. (Patient not taking: Reported on 01/22/2020)   . Aspirin-Acetaminophen (GOODYS BODY PAIN PO) Take 1 packet by mouth daily.   . clopidogrel (PLAVIX) 75 MG tablet TAKE ONE (1) TABLET BY MOUTH ONCE DAILY   . diclofenac Sodium (VOLTAREN) 1 % GEL    . diphenhydrAMINE (BENADRYL) 25 MG tablet Take 25 mg by mouth in the morning.   . Dulaglutide (TRULICITY) 1.5 BS/9.6GE SOPN Inject 1.5 mg into the skin once a week.   . fenofibrate 160 MG tablet TAKE ONE (1) TABLET ONCE DAILY   . fluticasone (FLONASE) 50 MCG/ACT nasal spray Place 1 spray into both nostrils daily.   . Fluticasone-Salmeterol,sensor, (AIRDUO DIGIHALER) 113-14 MCG/ACT AEPB Inhale 1 puff into the lungs in the morning and at bedtime.   . furosemide (LASIX) 40 MG tablet Take 2 tablets (80 mg total) by mouth daily.   [name] glucose blood (ONETOUCH VERIO) test strip USE DAILY TO CHECK YOUR BLOOD SUGAR (E11.69)   . lamoTRIgine (LAMICTAL) 25 MG tablet TAKE 1 TABLET DAILY FOR 7 DAYS, THEN INCREASE TO 2 TABLETS DAILY (Patient taking differently: Take 50 mg by mouth daily. )   . lansoprazole (PREVACID) 30 MG capsule Take 30 mg by mouth daily.    [name] lisinopril-hydrochlorothiazide (ZESTORETIC) 10-12.5 MG tablet Take 1 tablet by mouth daily.   . metoprolol tartrate (LOPRESSOR) 50 MG tablet Take 50 mg by mouth daily.  02/02/2019: LF 12/24/18  . nitroGLYCERIN (NITROSTAT) 0.4 MG SL tablet Place 1 tablet (0.4 mg total)  under the tongue every 5 (five) minutes as needed.   [name] VERIO test strip USE DAILY TO CHECK YOUR BLOOD SUGAR (E11.69)   . oxybutynin (DITROPAN) 5 MG tablet One three times a day   . oxyCODONE-acetaminophen (PERCOCET) 10-325 MG tablet Take 1 tablet by mouth every 6 (six) hours as needed.   . pantoprazole (PROTONIX) 40 MG tablet Take 1 tablet (40 mg total) by mouth 2 (two) times daily. (Patient not taking: Reported on 01/22/2020)   . pregabalin (LYRICA) 50 MG capsule Take 50 capsules by mouth  in the morning and at bedtime.   . rosuvastatin (CRESTOR) 20 MG tablet TAKE ONE (1) TABLET BY MOUTH ONCE DAILY   . tiZANidine (ZANAFLEX) 4 MG tablet Take 1 tablet (4 mg total) by mouth in the morning and at bedtime.   . TRULICITY 9.39 QZ/0.0PQ SOPN INJECT 0.5ML BY SUBCUTANEOUS ROUTE EVERY7 DAYS IN THE ABDOMEN, THIGH, OR UPPER ARM ROTATING INJECTION SITES (Patient not taking: Reported on 01/22/2020)   . VASCEPA 1 g capsule TAKE 2 CAPSULES TWICE A DAY WITH FOOD   . venlafaxine XR (EFFEXOR-XR) 75 MG 24 hr capsule TAKE 1 CAPSULE BY MOUTH EVERY MORNING   . vitamin B-12 (CYANOCOBALAMIN) 50 MCG tablet Take 50 mcg by mouth in the morning.   . Vitamin D, Cholecalciferol, 10 MCG (400 UNIT) CAPS Take 400 Units by mouth daily.    No facility-administered encounter medications on file as of 01/25/2020.   No Known Allergies  SDOH Screenings   Alcohol Screen:   . Last Alcohol Screening Score (AUDIT):   Depression (PHQ2-9): Medium Risk  . PHQ-2 Score: 16  Financial Resource Strain:   . Difficulty of Paying Living Expenses:   Food Insecurity:   . Worried About Charity fundraiser in the Last Year:   . [employer] of Food in the Last Year:   Housing: [place]   . Last Housing Risk Score: 0  Physical Activity:   . Days of Exercise per Week:   . Minutes of Exercise per Session:   Social Connections:   . Frequency of Communication with Friends and Family:   . Frequency of Social Gatherings with Friends and Family:   . Attends Religious Services:   . Active Member of Clubs or Organizations:   . Attends Archivist Meetings:   [name] Marital Status:   Stress: Stress Concern Present  . Feeling of Stress : Rather much  Tobacco Use: High Risk  . Smoking Tobacco Use: Current Every Day Smoker  . Smokeless Tobacco Use: Never Used  Transportation Needs: No Transportation Needs  . Lack of Transportation (Medical): No  . Lack of Transportation (Non-Medical): No      Current Diagnosis/Assessment:   Goals Addressed            This Visit's Progress   . Pharmacy Care Plan - HTN       CARE PLAN ENTRY (see longitudinal plan of care for additional care plan information)  Current Barriers:  . Chronic disease support, education and care coordination needs related to hypertension.  . Current antihypertensive regimen: furosemide, lisinopril-hctz amlodipine . Previous antihypertensives tried: lisniopril, torsemide . Last practice recorded BP readings:  BP Readings from Last 3 Encounters:  12/31/19 (!) 164/80  12/25/19 (!) 162/98  12/18/19 (!) 182/72 .   [name] Current home BP readings: begin checking today 89/57 . Most recent eGFR/CrCl: No results found for: EGFR  No components found for: CRCL  Pharmacist Clinical Goal(s):  .  Over the next 30 days, patient will work with PharmD and providers to optimize antihypertensive regimen  Interventions: . Inter-disciplinary care team collaboration (see longitudinal plan of care) . Comprehensive medication review performed; medication list updated in the electronic medical record.  . Continue to check blood pressure twice daily and hold amlodipine. Call PharmD with any change.   Patient Self Care Activities:  . Patient will continue to check BP twice daily, document, and provide at future appointments . Patient will focus on medication adherence by enrolling in adherence packaging, administering medications as prescribed, and contacting the providers office for any concerns or questions.   Initial goal documentation       Hypertension   BP today is:  94/50 mmHg  Office blood pressures are  BP Readings from Last 3 Encounters:  01/22/20 (!) 94/50  12/31/19 (!) 164/80  12/25/19 (!) 162/98   Patient has failed these meds in the past: hctz, torsemide, lisinopril Patient is currently uncontrolled on the following medications: amlodipine 10 mg daily, furosemide 40 mg twice daily, lisinopril- hctz 10-12.5 mg, metoprolol tartrate 50 mg   Patient checks BP at home daily  Patient home BP readings are ranging: 94/50 mmHg  We discussed diet and exercise extensively. Patient does all the cooking. She said she doesn't eat much and doesn't salt. Likes to eat broccoli and cheese, chicken baked in oven, Brother comes to eat every day with patient and her sister. Cooks pinto beans and green beans. Rinses chicken noodle soup because of how salty is.  01/22/2020 Patient's blood pressure is low in office today. Patient reports that her head feels unclear but attributed that to resolving concussion. Patient's medications were reviewed thoroughly in office today. Her medication routine at home seems somewhat unclear. Patient to be enrolled in packaging to improve consistency of routine. Patient's discontinued medications removed from regular medicine and taken for proper disposal.    01/25/2020: Patient's home readings are improving. She states that she feels good. Was tired last night when she checked evening bp and it was lower.  114/77 and 113/71 mmHg on 05/01.  119/73 and 93/57 mmHg on 05/02.   Plan  Continue to check blood pressure twice daily. Please bring results log back to Dr. [name] on 02/01/2020 for appointment. Continue to hold amlodipine until visit with Dr. [name]. Call pharmacist with any readings above 140/90 or below 100/70 mmHg.    Medication Management   Pt uses [facility] for all medications Uses pill box? No - has them in the bottles and orgaznies them by time of day on top of bottle.It is the way she feels best about it.  Pt endorses some episdoes of non compliance. She mentions that she skipped her medications over the weekend and felt better than when she takes them.   We discussed: Packaging to improve adherence.   Verbal consent obtained for [facility] enhanced pharmacy services (medication synchronization, adherence packaging, delivery coordination). A medication sync plan was created to allow  patient to get all medications delivered once every 30 to 90 days per patient preference. Patient understands they have freedom to choose pharmacy and clinical pharmacist will coordinate care between all prescribers and [facility].    Plan  Utilize [facility] for medication synchronization, packaging and delivery    Follow up: 1 month phone visit

## 2020-01-27 ENCOUNTER — Other Ambulatory Visit: Payer: Self-pay | Admitting: Family Medicine

## 2020-01-27 MED ORDER — PREGABALIN 50 MG PO CAPS: 50.0000 mg | ORAL_CAPSULE | Freq: Two times a day (BID) | ORAL | 1 refills | Status: DC

## 2020-01-28 NOTE — Progress Notes (Signed)
Subjective:  Patient ID: Sonya Small, female    DOB: June 14, 1951  Age: 69 y.o. MRN: [id number]  Chief Complaint  Patient presents with  . Hypertension    HPI Sonya Small presents for hypertensive heart disease. Her bp at home has been 106-119/77-80 AND 90-122/60-80 AND PULSE 70-97. Denies chest pain. Has chronic stable dypsnea. Feels lightheaded. She is currently on amlodipine 10 mg once daily, metoprolol 50 mg once daily, and lisinopril/hctz 10/12.5 daily.  Urge incontinence: I increased oxybutynin 5 mg increased three times daily. Pt continues to have incontinence. She has not been wearing a depends and she is leaking onto her clothes frequently.  Diabetes - sugars 118-176. Patient is taking trulicity 1.5 mg once weekly. She is tolerating it well.   Patient has CAD and hyperlipidemia. She is taking fenofibrate, vascepa, and crestor.   She is having pain in her thoracic back pain which is radiating from left to right and then over her right shoulder. She is using hemp cream which may help some, in addition, to the percocet. They have ordered an MRI.   Past Medical History:  Diagnosis Date  . Anxiety   . CAD (coronary artery disease)    a. NSTEMI 11/2008 s/p DES to LCx (3.0x12 Xience); b. NSTEMI 01/2010 secondary to thrombotic RCA lesion (non-obstructive)-->med rx (integrilin x 24 hrs + plavix); c. 09/2012 negative Myoview.  . Chronic diastolic CHF (congestive heart failure) ([place])    a. 06/2014 Echo: EF 55-60%, no rwma, Gr1 DD, mild AI.  [name] Depression   . Dizziness   . Drug induced constipation   . Generalized hyperhidrosis   . GERD (gastroesophageal reflux disease)   . Headache   . Hyperlipidemia   . Hypertensive heart disease   . Lumbar disc disease   . Metabolic encephalopathy   . Mixed hyperlipidemia   . Myocardial infarction ([place])   . Obstructive sleep apnea   . OP (osteoporosis)   . Osteoarthritis   . Osteoporosis   . Other malaise   . Overweight(278.02)   . PAD  (peripheral artery disease) ([place])    a. Emboli to R foot 2010 from partially occlusive lesion in R EIA, s/p stenting. - followed by Dr. Donnetta Hutching;  b. 10/2015 ABIs: R 1.03, L 0.97.  [name] Restless leg   . Sleep apnea   . Stroke ([place])   . TIA (transient ischemic attack)   . Tobacco abuse   . Urge incontinence    Past Surgical History:  Procedure Laterality Date  . EYE SURGERY     at age 31  . hysterectomy -age 57    . ILIAC ARTERY STENT     RIGHT ILIAC STENT  . KNEE ARTHROSCOPY    . LUMBAR LAMINECTOMY    . TUBAL LIGATION      Family History  Problem Relation Age of Onset  . Alzheimer's disease Mother   . Cancer Brother   . Heart disease Other        Grandfather  . Hepatitis C Brother   . Diabetes Brother   . Thyroid disease Brother   . Hypertension Brother   . Depression Brother    Social History   Socioeconomic History  . Marital status: Divorced    Spouse name: Not on file  . Number of children: 3  . Years of education: 10 th  . Highest education level: Not on file  Occupational History  . Occupation: DISABLED    Employer: UNEMPLOYED  Tobacco Use  .  Smoking status: Current Every Day Smoker    Packs/day: 0.50    Types: Cigarettes    Last attempt to quit: 11/20/2012    Years since quitting: 7.2  . Smokeless tobacco: Never Used  Substance and Sexual Activity  . Alcohol use: No    Alcohol/week: 0.0 standard drinks  . Drug use: No  . Sexual activity: Never  Other Topics Concern  . Not on file  Social History Narrative   Patient is single with 3 children, 1 deceased.   Patient is right handed.   Patient has 10 th grade education.   Patient drinks 5 or more cups daily.   Social Determinants of Health   Financial Resource Strain:   . Difficulty of Paying Living Expenses:   Food Insecurity:   . Worried About Charity fundraiser in the Last Year:   . Arboriculturist in the Last Year:   Transportation Needs: No Transportation Needs  . Lack of Transportation  (Medical): No  . Lack of Transportation (Non-Medical): No  Physical Activity:   . Days of Exercise per Week:   . Minutes of Exercise per Session:   Stress: Stress Concern Present  . Feeling of Stress : Rather much  Social Connections:   . Frequency of Communication with Friends and Family:   . Frequency of Social Gatherings with Friends and Family:   . Attends Religious Services:   . Active Member of Clubs or Organizations:   . Attends Archivist Meetings:   [name] Marital Status:     Review of Systems  Constitutional: Positive for fatigue and fever. Negative for chills.  HENT: Negative for congestion, ear pain, rhinorrhea and sore throat.   Respiratory: Positive for cough (First thing in the morning) and shortness of breath.   Cardiovascular: Negative for chest pain.  Gastrointestinal: Negative for abdominal pain, constipation, diarrhea, nausea and vomiting.  Genitourinary: Negative for dysuria and urgency.       Continued bladder incontinence.  Musculoskeletal: Positive for back pain and myalgias.  Neurological: Positive for dizziness and light-headedness. Negative for weakness and headaches.  Psychiatric/Behavioral: Positive for behavioral problems, decreased concentration and dysphoric mood. Negative for suicidal ideas. The patient is nervous/anxious.      Objective:  BP 136/72   Pulse 92   Temp 97.7 F (36.5 C)   Ht 5\' 5"  (1.651 m)   Wt 164 lb (74.4 kg)   SpO2 96%   BMI 27.29 kg/m   BP/Weight 02/01/2020 [id number]  Systolic BP [id number]  Diastolic BP 72 50 80  Wt. (Lbs) 164 - 162  BMI 27.29 - 26.96    Physical Exam Vitals reviewed.  Constitutional:      Appearance: Normal appearance. She is normal weight.  Cardiovascular:     Rate and Rhythm: Normal rate and regular rhythm.     Heart sounds: Normal heart sounds.  Pulmonary:     Effort: Pulmonary effort is normal. No respiratory distress.     Breath sounds: Normal breath sounds.  Abdominal:       General: Abdomen is flat. Bowel sounds are normal.     Palpations: Abdomen is soft.     Tenderness: There is no abdominal tenderness.  Musculoskeletal:        General: Tenderness (lumbar and thoracic back pain - paraspinal muscles.) present.  Neurological:     Mental Status: She is alert and oriented to person, place, and time.  Psychiatric:  Mood and Affect: Mood normal.        Behavior: Behavior normal.     Lab Results  Component Value Date   WBC 10.5 12/04/2019   HGB 15.5 12/04/2019   HCT 47.1 (H) 12/04/2019   PLT 244 12/04/2019   GLUCOSE 131 (H) 12/04/2019   CHOL 167 12/04/2019   TRIG 180 (H) 12/04/2019   HDL 49 12/04/2019   LDLCALC 87 12/04/2019   ALT 16 12/04/2019   AST 19 12/04/2019   NA 147 (H) 12/04/2019   K 4.0 12/04/2019   CL 102 12/04/2019   CREATININE 0.62 12/04/2019   BUN 13 12/04/2019   CO2 27 12/04/2019   TSH 0.619 12/04/2019   INR 1.0 01/31/2019   HGBA1C 7.1 (H) 12/04/2019   MICROALBUR 30 12/04/2019      Assessment & Plan:   1. Dizziness/Weakness - Numerous medicines may be contributing to her dizziness.  I am going to have her hold several medicines as I am unable to tell what the cause of her dizziness is. Hold oxybutynin, lyrica, tizanidine.  Sherre Poot, PHD, our clinic pharmacist is going to speak with her in 2 weeks.  2. Diabetic polyneuropathy associated with type 2 diabetes mellitus ([place]) The current medical regimen is effective;  continue present plan and medications. Recommend continue to work on eating healthy diet and exercise.  3. Moderate recurrent major depression ([place]) This is an ongoing issue. Continue lamictal 50 mg daily and effexor xr 75 mg once daily in am.   4. Hypertensive heart disease with chronic combined systolic and diastolic congestive heart failure ([place]) The current medical regimen is effective;  continue present plan and medications.  I do not wish to decrease her bp medications at this point.  Consider this in the future.   5. Mixed stress and urge urinary incontinence Recommend wearing depends for now, until we can determine if oxybutynin is without side effect of dizziness.   Follow-up: with pharmacist. Recommend follow up for chronic visit in mid June fasting.  An After Visit Summary was printed and given to the patient.  [name] [facility] [phone]

## 2020-01-28 NOTE — Progress Notes (Signed)
Richarda Overlie, MD, have reviewed all documentation for this visit. The documentation on 01/28/20 for the exam, diagnosis, procedures, and orders are all accurate and complete.  I have collaborated with the care management provider regarding care management and care coordination activities outlined in this encounter and have reviewed this encounter including documentation in the note and care plan. I am certifying that I agree with the content of this note and encounter as supervising physician.

## 2020-02-01 ENCOUNTER — Other Ambulatory Visit: Payer: Self-pay

## 2020-02-01 ENCOUNTER — Ambulatory Visit (INDEPENDENT_AMBULATORY_CARE_PROVIDER_SITE_OTHER): Payer: Medicare Other | Admitting: Family Medicine

## 2020-02-01 VITALS — BP 136/72 | HR 92 | Temp 97.7°F | Ht 65.0 in | Wt 164.0 lb

## 2020-02-01 DIAGNOSIS — R531 Weakness: Secondary | ICD-10-CM | POA: Diagnosis not present

## 2020-02-01 DIAGNOSIS — R42 Dizziness and giddiness: Secondary | ICD-10-CM

## 2020-02-01 DIAGNOSIS — E1142 Type 2 diabetes mellitus with diabetic polyneuropathy: Secondary | ICD-10-CM | POA: Diagnosis not present

## 2020-02-01 DIAGNOSIS — F331 Major depressive disorder, recurrent, moderate: Secondary | ICD-10-CM

## 2020-02-01 DIAGNOSIS — I5042 Chronic combined systolic (congestive) and diastolic (congestive) heart failure: Secondary | ICD-10-CM

## 2020-02-01 DIAGNOSIS — I110 Hypertensive heart disease with heart failure: Secondary | ICD-10-CM

## 2020-02-01 DIAGNOSIS — N3946 Mixed incontinence: Secondary | ICD-10-CM

## 2020-02-07 ENCOUNTER — Encounter: Payer: Self-pay | Admitting: Family Medicine

## 2020-02-07 DIAGNOSIS — R42 Dizziness and giddiness: Secondary | ICD-10-CM | POA: Insufficient documentation

## 2020-02-08 ENCOUNTER — Other Ambulatory Visit: Payer: Self-pay

## 2020-02-08 ENCOUNTER — Ambulatory Visit: Payer: Medicare Other

## 2020-02-08 DIAGNOSIS — E1142 Type 2 diabetes mellitus with diabetic polyneuropathy: Secondary | ICD-10-CM

## 2020-02-08 DIAGNOSIS — I5022 Chronic systolic (congestive) heart failure: Secondary | ICD-10-CM

## 2020-02-08 NOTE — Patient Instructions (Signed)
Visit Information  Goals Addressed            This Visit's Progress   . Pharmacy Care Plan - DM       CARE PLAN ENTRY (see longitudinal plan of care for additional care plan information)  Current Barriers:  . Diabetes: complicated by chronic medical conditions including HTN, HLD. Lab Results  Component Value Date   HGBA1C 7.1 (H) 12/04/2019 .   Lab Results  Component Value Date   CREATININE 0.62 12/04/2019   CREATININE 0.71 02/06/2019   CREATININE 0.76 02/06/2019 .   [name] No results found for: EGFR . Current antihyperglycemic regimen: Trulicity 1.5 mg . Reports denies hypoglycemic symptoms, including dizziness, lightheadedness, shaking, sweating . Denies hyperglycemic symptoms, including polyuria, polydipsia, polyphagia, nocturia, blurred vision, neuropathy . Current meal patterns: o Lunch:baked meats and vegetables o Supper:broccoli and cheese, chicken noodle soup o Drinks sugar free twist . Current exercise: none . Current blood glucose readings: 116 mg/dL this am . Cardiovascular risk reduction: o Current hypertensive regimen:lisinopril-hctz, furosemide, metoprolol o Current hyperlipidemia regimen: rosuvastatin 20 mg, fenofibrate 160 mg, vascepa o Current antiplatelet regimen:plavix  Pharmacist Clinical Goal(s):  [name] Over the next 30 days, patient will work with PharmD and primary care provider to address blood sugar control and medication adherence.   Interventions: . Comprehensive medication review performed, medication list updated in electronic medical record . Inter-disciplinary care team collaboration (see longitudinal plan of care) . Provided patient with additional 2 week sample of Trulicity 1.5 mg.  Patient Self Care Activities:  . Patient will check blood glucose daily , document, and provide at future appointments . Patient will focus on medication adherence by enrolling in adherence packaging. . Patient will take medications as prescribed . Patient will  contact provider with any episodes of hypoglycemia . Patient will report any questions or concerns to provider   Please see past updates related to this goal by clicking on the "Past Updates" button in the selected goal      . Pharmacy Care Plan - HTN       CARE PLAN ENTRY (see longitudinal plan of care for additional care plan information)  Current Barriers:  . Chronic disease support, education and care coordination needs related to hypertension.  . Current antihypertensive regimen: furosemide, lisinopril-hctz . Previous antihypertensives tried: lisniopril, torsemide . Last practice recorded BP readings:  BP Readings from Last 3 Encounters:  12/31/19 (!) 164/80  12/25/19 (!) 162/98  12/18/19 (!) 182/72 .   [name] Current home BP readings: begin checking today 112/78 . Most recent eGFR/CrCl: No results found for: EGFR  No components found for: CRCL  Pharmacist Clinical Goal(s):  [name] Over the next 30 days, patient will work with PharmD and providers to optimize antihypertensive regimen  Interventions: . Inter-disciplinary care team collaboration (see longitudinal plan of care) . Comprehensive medication review performed; medication list updated in the electronic medical record.  . Continue to check blood pressure twice daily and hold amlodipine. Call PharmD with any change.   Patient Self Care Activities:  . Patient will continue to check BP twice daily, document, and provide at future appointments . Patient will focus on medication adherence by enrolling in adherence packaging, administering medications as prescribed, and contacting the providers office for any concerns or questions.   Please see past updates related to this goal by clicking on the "Past Updates" button in the selected goal         The patient verbalized understanding of instructions provided today and  declined a print copy of patient instruction materials.   Telephone follow up appointment with pharmacy team  member scheduled for: 02/2020  Sherre Poot, PharmD, [facility] Clinical Pharmacist [name] [phone] (office) [phone] (mobile)

## 2020-02-08 NOTE — Chronic Care Management (AMB) (Signed)
Chronic Care Management Pharmacy  Name: Sonya Small  MRN: [id number] DOB: [birth year]  Chief Complaint/ HPI  Sonya Small,  69 y.o. , female presents for their Follow-Up CCM visit with the clinical pharmacist via telephone due to COVID-19 Pandemic.  PCP : [name], MD  Their chronic conditions include: Chronic Diastolic Heart Failure, CAD, PAD, HTN, Hx of stroke, COPD, DM, HLD, Depression, Urge incontinence.   Office Visits: 02/01/2020 - Hold oxybutynin, lyrica, tizanidine due to dizziness and dry mouth.[name]  01/11/2020 - Dr. [name] increased trulicity to 1.5 mg weekly and patient to pick up samples. Changed torsemide to furosemide.  12/31/2019 - Vraylar 1.5 mg added at last visit but not tolerated. Changed to lisinopril 10/12.5 mg daily, increased oxybutynin 5 mg to tid. 12/25/2019 - HTN elevated and edema x1 week at visit. Given amlodipine 10 mg daily, reduce salt, keep blood pressure log, follow up in two weeks, call blood pressure log in one week with Nurse. 12/18/2019 - Furosemide changed to torsemide 20 mg daily. 12/14/2019 - changed PPI to pantoprazole due to interaction with Plavix. 12/07/2019 - Vraylar changed to lamotrigine due to horrible nightmares. 12/04/2019 - Vraylar added. BP in office 136/70   Consult Visit: 01/18/2020 - Minor head injury with concussion. Patient went to ED after falling on a rock at home.  11/19/2019 - Cardiologist visit with Dr. [name]. BP well controlled. Asymptomatic CAD - continue aspirin and Plavix.  10/29/2019 - Patient experienced vertigo and nausea then fell. PCP referred her to ED.   Medications: Outpatient Encounter Medications as of 02/08/2020  Medication Sig Note  . albuterol (VENTOLIN HFA) 108 (90 Base) MCG/ACT inhaler Inhale 2 puffs into the lungs every 4 (four) hours as needed for wheezing or shortness of breath.   . ALPRAZolam (XANAX) 0.5 MG tablet TAKE 1 TABLET BY MOUTH THREE TIMES A DAY   . Aspirin-Acetaminophen (GOODYS BODY PAIN  PO) Take 1 packet by mouth daily.   . clopidogrel (PLAVIX) 75 MG tablet TAKE ONE (1) TABLET BY MOUTH ONCE DAILY   . diclofenac Sodium (VOLTAREN) 1 % GEL    . Dulaglutide (TRULICITY) 1.5 YQ/6.5HQ SOPN Inject 1.5 mg into the skin once a week.   . fenofibrate 160 MG tablet TAKE ONE (1) TABLET ONCE DAILY   . fluticasone (FLONASE) 50 MCG/ACT nasal spray Place 1 spray into both nostrils daily.   . Fluticasone-Salmeterol,sensor, (AIRDUO DIGIHALER) 113-14 MCG/ACT AEPB Inhale 1 puff into the lungs in the morning and at bedtime.   . furosemide (LASIX) 40 MG tablet Take 1 tablet (40 mg total) by mouth 2 (two) times daily.   [name] glucose blood (ONETOUCH VERIO) test strip USE DAILY TO CHECK YOUR BLOOD SUGAR (E11.69)   . icosapent Ethyl (VASCEPA) 1 g capsule TAKE 2 CAPSULES TWICE A DAY WITH FOOD   . lamoTRIgine (LAMICTAL) 25 MG tablet Take 2 tablets (50 mg total) by mouth daily.   . lansoprazole (PREVACID) 30 MG capsule Take 1 capsule (30 mg total) by mouth daily.   [name] lisinopril-hydrochlorothiazide (ZESTORETIC) 10-12.5 MG tablet Take 1 tablet by mouth daily.   . metoprolol tartrate (LOPRESSOR) 50 MG tablet Take 50 mg by mouth daily.  02/02/2019: LF 12/24/18  . nitroGLYCERIN (NITROSTAT) 0.4 MG SL tablet Place 1 tablet (0.4 mg total) under the tongue every 5 (five) minutes as needed.   [name] VERIO test strip USE DAILY TO CHECK YOUR BLOOD SUGAR (E11.69)   . oxyCODONE-acetaminophen (PERCOCET) 10-325 MG tablet Take 1 tablet by mouth every  6 (six) hours as needed.   . pantoprazole (PROTONIX) 40 MG tablet Take 1 tablet (40 mg total) by mouth 2 (two) times daily.   . rosuvastatin (CRESTOR) 20 MG tablet TAKE ONE (1) TABLET BY MOUTH ONCE DAILY   . venlafaxine XR (EFFEXOR-XR) 75 MG 24 hr capsule Take 1 capsule (75 mg total) by mouth every morning.   . vitamin B-12 (CYANOCOBALAMIN) 50 MCG tablet Take 50 mcg by mouth in the morning.   . Vitamin D, Cholecalciferol, 10 MCG (400 UNIT) CAPS Take 400 Units by mouth daily.   [name]  amLODipine (NORVASC) 10 MG tablet Take 1 tablet (10 mg total) by mouth daily. (Patient not taking: Reported on 02/08/2020)   . oxybutynin (DITROPAN) 5 MG tablet One three times a day (Patient not taking: Reported on 02/08/2020)   . pregabalin (LYRICA) 50 MG capsule Take 1 capsule (50 mg total) by mouth in the morning and at bedtime. (Patient not taking: Reported on 02/08/2020)   . tiZANidine (ZANAFLEX) 4 MG tablet Take 1 tablet (4 mg total) by mouth in the morning and at bedtime. (Patient not taking: Reported on 02/08/2020)    No facility-administered encounter medications on file as of 02/08/2020.   No Known Allergies  SDOH Screenings   Alcohol Screen:   . Last Alcohol Screening Score (AUDIT):   Depression (PHQ2-9): Medium Risk  . PHQ-2 Score: 9  Financial Resource Strain:   . Difficulty of Paying Living Expenses:   Food Insecurity:   . Worried About Charity fundraiser in the Last Year:   . [employer] of Food in the Last Year:   Housing: [place]   . Last Housing Risk Score: 0  Physical Activity:   . Days of Exercise per Week:   . Minutes of Exercise per Session:   Social Connections:   . Frequency of Communication with Friends and Family:   . Frequency of Social Gatherings with Friends and Family:   . Attends Religious Services:   . Active Member of Clubs or Organizations:   . Attends Archivist Meetings:   [name] Marital Status:   Stress: Stress Concern Present  . Feeling of Stress : Rather much  Tobacco Use: High Risk  . Smoking Tobacco Use: Current Every Day Smoker  . Smokeless Tobacco Use: Never Used  Transportation Needs: No Transportation Needs  . Lack of Transportation (Medical): No  . Lack of Transportation (Non-Medical): No      Current Diagnosis/Assessment:  Goals Addressed            This Visit's Progress   . Pharmacy Care Plan - DM       CARE PLAN ENTRY (see longitudinal plan of care for additional care plan information)  Current Barriers:   . Diabetes: complicated by chronic medical conditions including HTN, HLD. Lab Results  Component Value Date   HGBA1C 7.1 (H) 12/04/2019 .   Lab Results  Component Value Date   CREATININE 0.62 12/04/2019   CREATININE 0.71 02/06/2019   CREATININE 0.76 02/06/2019 .   [name] No results found for: EGFR . Current antihyperglycemic regimen: Trulicity 1.5 mg . Reports denies hypoglycemic symptoms, including dizziness, lightheadedness, shaking, sweating . Denies hyperglycemic symptoms, including polyuria, polydipsia, polyphagia, nocturia, blurred vision, neuropathy . Current meal patterns: o Lunch:baked meats and vegetables o Supper:broccoli and cheese, chicken noodle soup o Drinks sugar free twist . Current exercise: none . Current blood glucose readings: 116 mg/dL this am . Cardiovascular risk reduction: o Current hypertensive regimen:lisinopril-hctz,  furosemide, metoprolol o Current hyperlipidemia regimen: rosuvastatin 20 mg, fenofibrate 160 mg, vascepa o Current antiplatelet regimen:plavix  Pharmacist Clinical Goal(s):  [name] Over the next 30 days, patient will work with PharmD and primary care provider to address blood sugar control and medication adherence.   Interventions: . Comprehensive medication review performed, medication list updated in electronic medical record . Inter-disciplinary care team collaboration (see longitudinal plan of care) . Provided patient with additional 2 week sample of Trulicity 1.5 mg.  Patient Self Care Activities:  . Patient will check blood glucose daily , document, and provide at future appointments . Patient will focus on medication adherence by enrolling in adherence packaging. . Patient will take medications as prescribed . Patient will contact provider with any episodes of hypoglycemia . Patient will report any questions or concerns to provider   Please see past updates related to this goal by clicking on the "Past Updates" button in the selected  goal      . Pharmacy Care Plan - HTN       CARE PLAN ENTRY (see longitudinal plan of care for additional care plan information)  Current Barriers:  . Chronic disease support, education and care coordination needs related to hypertension.  . Current antihypertensive regimen: furosemide, lisinopril-hctz . Previous antihypertensives tried: lisniopril, torsemide . Last practice recorded BP readings:  BP Readings from Last 3 Encounters:  12/31/19 (!) 164/80  12/25/19 (!) 162/98  12/18/19 (!) 182/72 .   [name] Current home BP readings: begin checking today 112/78 . Most recent eGFR/CrCl: No results found for: EGFR  No components found for: CRCL  Pharmacist Clinical Goal(s):  [name] Over the next 30 days, patient will work with PharmD and providers to optimize antihypertensive regimen  Interventions: . Inter-disciplinary care team collaboration (see longitudinal plan of care) . Comprehensive medication review performed; medication list updated in the electronic medical record.  . Continue to check blood pressure twice daily and hold amlodipine. Call PharmD with any change.   Patient Self Care Activities:  . Patient will continue to check BP twice daily, document, and provide at future appointments . Patient will focus on medication adherence by enrolling in adherence packaging, administering medications as prescribed, and contacting the providers office for any concerns or questions.   Please see past updates related to this goal by clicking on the "Past Updates" button in the selected goal        Diabetes   Recent Relevant Labs: Lab Results  Component Value Date/Time   HGBA1C 7.1 (H) 12/04/2019 10:20 AM   HGBA1C 7.6 (H) 02/01/2019 04:37 AM   MICROALBUR 30 12/04/2019 10:43 AM    Lab Results  Component Value Date   CREATININE 0.62 12/04/2019   CREATININE 0.71 02/06/2019   CREATININE 0.76 02/06/2019   Checking BG: Daily  Recent FBG Readings: 112 mg/dL Patient has failed these meds  in past: farxiga 5 mg, metformin xr 750 mg, janumet 50-500 mg bid. Patient is currently uncontrolled on the following medications: trulicity 1.5 mg weekly,  Last diabetic Foot exam: 12/16/2019 Last diabetic Eye exam: Can't recall  We discussed: Patient's Trulicity increased to 1.5 mg/week. Her blood sugar today is 112 mg/dL She complains of feeling tired but her blood sugar has been stable at this reading for the past week.  Plan  Continue current medications,   Hypertension   BP today is:  112/78 mmHg  Office blood pressures are  BP Readings from Last 3 Encounters:  02/01/20 136/72  01/22/20 (!) 94/50  12/31/19 [name])  164/80   Patient has failed these meds in the past: hctz, torsemide, lisinopril, amlodipine Patient is currently uncontrolled on the following medications:  furosemide 40 mg twice daily, lisinopril- hctz 10-12.5 mg, metoprolol tartrate 50 mg  Patient checks BP at home daily  Patient home BP readings are ranging: 94/50 mmHg  We discussed diet and exercise extensively. Patient does all the cooking. She said she doesn't eat much and doesn't salt. Likes to eat broccoli and cheese, chicken baked in oven, Brother comes to eat every day with patient and her sister. Cooks pinto beans and green beans. Rinses chicken noodle soup because of how salty is.  01/22/2020 Patient's blood pressure is low in office today. Patient reports that her head feels unclear but attributed that to resolving concussion. Patient's medications were reviewed thoroughly in office today. Her medication routine at home seems somewhat unclear. Patient to be enrolled in packaging to improve consistency of routine. Patient's discontinued medications removed from regular medicine and taken for proper disposal.    01/25/2020: Patient's home readings are improving. She states that she feels good. Was tired last night when she checked evening bp and it was lower.  114/77 and 113/71 mmHg on 05/01.  119/73 and  93/57 mmHg on 05/02.   02/08/2020 - BP 112/78 mmHg  Plan  Continue to check blood pressure twice daily and continue current medications.   Urge Incontinence   Patient has failed these meds in past: vesicare 10 mg, myrbetriq 50 mg, oxybutynin 5 mg tid Patient is currently controlled on the following medications:n/a   We discussed: Dr. [name] held dose of oxybutynin on 02/01/2020 due to patient's common complaints of dry mouth. Patient reports dry mouth is somewhat improved and bladder symptoms are present. Patient does not wish to resume medications at this time.   Plan  Continue current medications   Medication Management   Pt uses [facility]  Uses pill box? No transitioning to packaging.   We discussed: Packaging to improve adherence.   Verbal consent obtained for [facility] enhanced pharmacy services (medication synchronization, adherence packaging, delivery coordination). A medication sync plan was created to allow patient to get all medications delivered once every 30 to 90 days per patient preference. Patient understands they have freedom to choose pharmacy and clinical pharmacist will coordinate care between all prescribers and [facility].    Plan  Utilize [facility] for medication synchronization, packaging and delivery    Follow up: 1 month phone visit

## 2020-02-13 ENCOUNTER — Other Ambulatory Visit: Payer: Self-pay | Admitting: Family Medicine

## 2020-02-15 ENCOUNTER — Other Ambulatory Visit: Payer: Self-pay

## 2020-02-15 ENCOUNTER — Other Ambulatory Visit: Payer: Self-pay | Admitting: Physician Assistant

## 2020-02-15 MED ORDER — FENOFIBRATE 160 MG PO TABS: ORAL_TABLET | ORAL | 1 refills | Status: DC

## 2020-02-15 MED ORDER — VALACYCLOVIR HCL 1 G PO TABS
ORAL_TABLET | ORAL | 2 refills | Status: DC
Start: 2020-02-15 — End: 2020-04-22

## 2020-02-15 MED ORDER — LISINOPRIL-HYDROCHLOROTHIAZIDE 10-12.5 MG PO TABS: 1.0000 | ORAL_TABLET | Freq: Every day | ORAL | 1 refills | Status: DC

## 2020-02-18 ENCOUNTER — Telehealth: Payer: Medicare Other

## 2020-03-01 ENCOUNTER — Other Ambulatory Visit: Payer: Self-pay

## 2020-03-01 MED ORDER — ONETOUCH ULTRASOFT LANCETS MISC
2 refills | Status: DC
Start: 2020-03-01 — End: 2020-03-30

## 2020-03-09 ENCOUNTER — Telehealth: Payer: Medicare Other

## 2020-03-17 ENCOUNTER — Ambulatory Visit: Payer: Medicare Other

## 2020-03-17 DIAGNOSIS — E1142 Type 2 diabetes mellitus with diabetic polyneuropathy: Secondary | ICD-10-CM

## 2020-03-17 DIAGNOSIS — I10 Essential (primary) hypertension: Secondary | ICD-10-CM

## 2020-03-17 NOTE — Chronic Care Management (AMB) (Signed)
Chronic Care Management Pharmacy  Name: IASIA FORCIER  MRN: [id number] DOB: Jan 27, 1951  Chief Complaint/ HPI  Sonya Small,  69 y.o. , female presents for their Follow-Up CCM visit with the clinical pharmacist via telephone due to COVID-19 Pandemic.  PCP : [name], MD  Their chronic conditions include: Chronic Diastolic Heart Failure, CAD, PAD, HTN, Hx of stroke, COPD, DM, HLD, Depression, Urge incontinence.   Office Visits: 02/01/2020 - Hold oxybutynin, lyrica, tizanidine due to dizziness and dry mouth.[name]  01/11/2020 - Dr. [name] increased trulicity to 1.5 mg weekly and patient to pick up samples. Changed torsemide to furosemide.  12/31/2019 - Vraylar 1.5 mg added at last visit but not tolerated. Changed to lisinopril 10/12.5 mg daily, increased oxybutynin 5 mg to tid. 12/25/2019 - HTN elevated and edema x1 week at visit. Given amlodipine 10 mg daily, reduce salt, keep blood pressure log, follow up in two weeks, call blood pressure log in one week with Nurse. 12/18/2019 - Furosemide changed to torsemide 20 mg daily. 12/14/2019 - changed PPI to pantoprazole due to interaction with Plavix. 12/07/2019 - Vraylar changed to lamotrigine due to horrible nightmares. 12/04/2019 - Vraylar added. BP in office 136/70   Consult Visit: 01/18/2020 - Minor head injury with concussion. Patient went to ED after falling on a rock at home.  11/19/2019 - Cardiologist visit with Dr. [name]. BP well controlled. Asymptomatic CAD - continue aspirin and Plavix.  10/29/2019 - Patient experienced vertigo and nausea then fell. PCP referred her to ED.   Medications: Outpatient Encounter Medications as of 03/17/2020  Medication Sig Note  . albuterol (VENTOLIN HFA) 108 (90 Base) MCG/ACT inhaler Inhale 2 puffs into the lungs every 4 (four) hours as needed for wheezing or shortness of breath.   . ALPRAZolam (XANAX) 0.5 MG tablet TAKE 1 TABLET BY MOUTH THREE TIMES A DAY   . amLODipine (NORVASC) 10 MG tablet Take 1  tablet (10 mg total) by mouth daily. (Patient not taking: Reported on 02/08/2020)   . Aspirin-Acetaminophen (GOODYS BODY PAIN PO) Take 1 packet by mouth daily.   . clopidogrel (PLAVIX) 75 MG tablet TAKE ONE (1) TABLET BY MOUTH ONCE DAILY   . diclofenac Sodium (VOLTAREN) 1 % GEL    . Dulaglutide (TRULICITY) 1.5 WP/7.9YI SOPN Inject 1.5 mg into the skin once a week.   . fenofibrate 160 MG tablet TAKE ONE (1) TABLET ONCE DAILY   . fluticasone (FLONASE) 50 MCG/ACT nasal spray Place 1 spray into both nostrils daily.   . Fluticasone-Salmeterol,sensor, (AIRDUO DIGIHALER) 113-14 MCG/ACT AEPB Inhale 1 puff into the lungs in the morning and at bedtime.   . furosemide (LASIX) 40 MG tablet Take 1 tablet (40 mg total) by mouth 2 (two) times daily.   [name] glucose blood (ONETOUCH VERIO) test strip USE DAILY TO CHECK YOUR BLOOD SUGAR (E11.69)   . icosapent Ethyl (VASCEPA) 1 g capsule TAKE 2 CAPSULES TWICE A DAY WITH FOOD   . lamoTRIgine (LAMICTAL) 25 MG tablet Take 2 tablets (50 mg total) by mouth daily.   . Lancets (ONETOUCH ULTRASOFT) lancets Use as instructed   . lansoprazole (PREVACID) 30 MG capsule Take 1 capsule (30 mg total) by mouth daily.   [name] lisinopril-hydrochlorothiazide (ZESTORETIC) 10-12.5 MG tablet Take 1 tablet by mouth daily.   . metoprolol tartrate (LOPRESSOR) 50 MG tablet Take 50 mg by mouth daily.  02/02/2019: LF 12/24/18  . nitroGLYCERIN (NITROSTAT) 0.4 MG SL tablet Place 1 tablet (0.4 mg total) under the tongue every 5 (  five) minutes as needed.   [name] VERIO test strip USE DAILY TO CHECK YOUR BLOOD SUGAR (E11.69)   . oxybutynin (DITROPAN) 5 MG tablet One three times a day (Patient not taking: Reported on 02/08/2020)   . oxyCODONE-acetaminophen (PERCOCET) 10-325 MG tablet Take 1 tablet by mouth every 6 (six) hours as needed.   . pantoprazole (PROTONIX) 40 MG tablet Take 1 tablet (40 mg total) by mouth 2 (two) times daily.   . pregabalin (LYRICA) 50 MG capsule Take 1 capsule (50 mg total) by  mouth in the morning and at bedtime. (Patient not taking: Reported on 02/08/2020)   . rosuvastatin (CRESTOR) 20 MG tablet TAKE ONE (1) TABLET BY MOUTH ONCE DAILY   . tiZANidine (ZANAFLEX) 4 MG tablet Take 1 tablet (4 mg total) by mouth in the morning and at bedtime. (Patient not taking: Reported on 02/08/2020)   . valACYclovir (VALTREX) 1000 MG tablet 2 po bid for one day   . venlafaxine XR (EFFEXOR-XR) 75 MG 24 hr capsule Take 1 capsule (75 mg total) by mouth every morning.   . vitamin B-12 (CYANOCOBALAMIN) 50 MCG tablet Take 50 mcg by mouth in the morning.   . Vitamin D, Cholecalciferol, 10 MCG (400 UNIT) CAPS Take 400 Units by mouth daily.    No facility-administered encounter medications on file as of 03/17/2020.   No Known Allergies  SDOH Screenings   Alcohol Screen:   . Last Alcohol Screening Score (AUDIT):   Depression (PHQ2-9): Medium Risk  . PHQ-2 Score: 9  Financial Resource Strain:   . Difficulty of Paying Living Expenses:   Food Insecurity:   . Worried About Charity fundraiser in the Last Year:   . [employer] of Food in the Last Year:   Housing: [place]   . Last Housing Risk Score: 0  Physical Activity:   . Days of Exercise per Week:   . Minutes of Exercise per Session:   Social Connections:   . Frequency of Communication with Friends and Family:   . Frequency of Social Gatherings with Friends and Family:   . Attends Religious Services:   . Active Member of Clubs or Organizations:   . Attends Archivist Meetings:   [name] Marital Status:   Stress: Stress Concern Present  . Feeling of Stress : Rather much  Tobacco Use: High Risk  . Smoking Tobacco Use: Current Every Day Smoker  . Smokeless Tobacco Use: Never Used  Transportation Needs: No Transportation Needs  . Lack of Transportation (Medical): No  . Lack of Transportation (Non-Medical): No      Current Diagnosis/Assessment:  Goals Addressed            This Visit's Progress   . Pharmacy Care Plan -  DM       CARE PLAN ENTRY (see longitudinal plan of care for additional care plan information)  Current Barriers:  . Diabetes: complicated by chronic medical conditions including HTN, HLD. Lab Results  Component Value Date   HGBA1C 7.1 (H) 12/04/2019 .   Lab Results  Component Value Date   CREATININE 0.62 12/04/2019   CREATININE 0.71 02/06/2019   CREATININE 0.76 02/06/2019 .   [name] No results found for: EGFR . Current antihyperglycemic regimen: Trulicity 1.5 mg . Reports denies hypoglycemic symptoms, including dizziness, lightheadedness, shaking, sweating . Denies hyperglycemic symptoms, including polyuria, polydipsia, polyphagia, nocturia, blurred vision, neuropathy . Current meal patterns: o Lunch:baked meats and vegetables o Supper:broccoli and cheese, chicken noodle soup o Drinks  sugar free twist . Current exercise: none . Current blood glucose readings: 116 mg/dL this am . Cardiovascular risk reduction: o Current hypertensive regimen:lisinopril-hctz, furosemide, metoprolol o Current hyperlipidemia regimen: rosuvastatin 20 mg, fenofibrate 160 mg, vascepa o Current antiplatelet regimen:plavix  Pharmacist Clinical Goal(s):  [name] Over the next 30 days, patient will work with PharmD and primary care provider to address blood sugar control and medication adherence.   Interventions: . Comprehensive medication review performed, medication list updated in electronic medical record . Inter-disciplinary care team collaboration (see longitudinal plan of care) . Enrolled in pharmacy delivery and transitioning to packaging for improved adherence.   Patient Self Care Activities:  . Patient will check blood glucose daily , document, and provide at future appointments . Patient will focus on medication adherence by enrolling in adherence packaging. . Patient will take medications as prescribed . Patient will contact provider with any episodes of hypoglycemia . Patient will report any  questions or concerns to provider   Please see past updates related to this goal by clicking on the "Past Updates" button in the selected goal      . Pharmacy Care Plan - HTN       CARE PLAN ENTRY (see longitudinal plan of care for additional care plan information)  Current Barriers:  . Chronic disease support, education and care coordination needs related to hypertension.  . Current antihypertensive regimen: furosemide, lisinopril-hctz . Previous antihypertensives tried: lisniopril, torsemide . Last practice recorded BP readings:  BP Readings from Last 3 Encounters:  02/01/20 136/72  01/22/20 (!) 94/50  12/31/19 (!) 164/80 .   [name] Current home BP readings: ~138/80. [name] Most recent eGFR/CrCl: No results found for: EGFR  No components found for: CRCL  Pharmacist Clinical Goal(s):  [name] Over the next 30 days, patient will work with PharmD and providers to optimize antihypertensive regimen  Interventions: . Inter-disciplinary care team collaboration (see longitudinal plan of care) . Comprehensive medication review performed; medication list updated in the electronic medical record.  . Continue to check blood pressure twice daily. Call PharmD with any change.   Patient Self Care Activities:  . Patient will continue to check BP twice daily, document, and provide at future appointments . Patient will focus on medication adherence by enrolling in adherence packaging, administering medications as prescribed, and contacting the providers office for any concerns or questions.   Please see past updates related to this goal by clicking on the "Past Updates" button in the selected goal        Diabetes   Recent Relevant Labs: Lab Results  Component Value Date/Time   HGBA1C 7.1 (H) 12/04/2019 10:20 AM   HGBA1C 7.6 (H) 02/01/2019 04:37 AM   MICROALBUR 30 12/04/2019 10:43 AM    Lab Results  Component Value Date   CREATININE 0.62 12/04/2019   CREATININE 0.71 02/06/2019   CREATININE 0.76  02/06/2019   Checking BG: Daily  Recent FBG Readings: 112 mg/dL Patient has failed these meds in past: farxiga 5 mg, metformin xr 750 mg, janumet 50-500 mg bid. Patient is currently uncontrolled on the following medications: trulicity 1.5 mg weekly,  Last diabetic Foot exam: 12/16/2019 Last diabetic Eye exam: Can't recall  We discussed: Patient's Trulicity increased to 1.5 mg/week. Her blood sugar today is 112 mg/dL She complains of feeling tired but her blood sugar has been stable at this reading for the past week.  Update 03/17/2020 - Patient's nephew passed away. She has been under a lot of stress. BS today 148 mg/dL.  142, 123, 156, 151, 144, 148, 149 in the past week in the am. Patient is not hungry right now due to stress of loss. She isn't eating regular meals or as healthy as normal but blood sugar is stable.  Plan  Continue current medications,   Hypertension   BP today is:  112/78 mmHg  Office blood pressures are  BP Readings from Last 3 Encounters:  02/01/20 136/72  01/22/20 (!) 94/50  12/31/19 (!) 164/80   Patient has failed these meds in the past: hctz, torsemide, lisinopril, amlodipine Patient is currently uncontrolled on the following medications:  furosemide 40 mg twice daily, lisinopril- hctz 10-12.5 mg, metoprolol tartrate 50 mg  Patient checks BP at home daily  Patient home BP readings are ranging: 94/50 mmHg  We discussed diet and exercise extensively. Patient does all the cooking. She said she doesn't eat much and doesn't salt. Likes to eat broccoli and cheese, chicken baked in oven, Brother comes to eat every day with patient and her sister. Cooks pinto beans and green beans. Rinses chicken noodle soup because of how salty is.  01/22/2020 Patient's blood pressure is low in office today. Patient reports that her head feels unclear but attributed that to resolving concussion. Patient's medications were reviewed thoroughly in office today. Her medication  routine at home seems somewhat unclear. Patient to be enrolled in packaging to improve consistency of routine. Patient's discontinued medications removed from regular medicine and taken for proper disposal.    01/25/2020: Patient's home readings are improving. She states that she feels good. Was tired last night when she checked evening bp and it was lower.  114/77 and 113/71 mmHg on 05/01.  119/73 and 93/57 mmHg on 05/02.   02/08/2020 - BP 112/78 mmHg  Update 03/17/2020 - Patient has been staying with family. Due to stress of death in family patient has not been eating normally. She is eating things like hamburger, fries, cheese puffs, and pork skins. BP has been 138/87 mmHg when checked lately. Patient is complaining of random pain in left arm and under breast. She can't identify what brings it on. It is relieved easily by drinking cold water. Pharmacist encouraged her to schedule a visit to be seen in office to address. Patient is deferring until after funeral and life settles down. She states she is broken hearted about her nephew and unable to cry.   Plan  Continue to check blood pressure twice daily and continue current medications.     Medication Management   Pt uses [facility]  Uses pill box? No transitioning to packaging.   We discussed: Packaging to improve adherence. Patient is a little confused on medications again. She is going to set up a time to come in and review with pharmacist once funeral is over.   Verbal consent obtained for [facility] enhanced pharmacy services (medication synchronization, adherence packaging, delivery coordination). A medication sync plan was created to allow patient to get all medications delivered once every 30 to 90 days per patient preference. Patient understands they have freedom to choose pharmacy and clinical pharmacist will coordinate care between all prescribers and [facility].    Plan  Utilize [facility] for  medication synchronization, packaging and delivery    Follow up: 1 month phone visit

## 2020-03-17 NOTE — Patient Instructions (Signed)
Visit Information  Goals Addressed            This Visit's Progress   . Pharmacy Care Plan - DM       CARE PLAN ENTRY (see longitudinal plan of care for additional care plan information)  Current Barriers:  . Diabetes: complicated by chronic medical conditions including HTN, HLD. Lab Results  Component Value Date   HGBA1C 7.1 (H) 12/04/2019 .   Lab Results  Component Value Date   CREATININE 0.62 12/04/2019   CREATININE 0.71 02/06/2019   CREATININE 0.76 02/06/2019 .   [name] No results found for: EGFR . Current antihyperglycemic regimen: Trulicity 1.5 mg . Reports denies hypoglycemic symptoms, including dizziness, lightheadedness, shaking, sweating . Denies hyperglycemic symptoms, including polyuria, polydipsia, polyphagia, nocturia, blurred vision, neuropathy . Current meal patterns: o Lunch:baked meats and vegetables o Supper:broccoli and cheese, chicken noodle soup o Drinks sugar free twist . Current exercise: none . Current blood glucose readings: 116 mg/dL this am . Cardiovascular risk reduction: o Current hypertensive regimen:lisinopril-hctz, furosemide, metoprolol o Current hyperlipidemia regimen: rosuvastatin 20 mg, fenofibrate 160 mg, vascepa o Current antiplatelet regimen:plavix  Pharmacist Clinical Goal(s):  [name] Over the next 30 days, patient will work with PharmD and primary care provider to address blood sugar control and medication adherence.   Interventions: . Comprehensive medication review performed, medication list updated in electronic medical record . Inter-disciplinary care team collaboration (see longitudinal plan of care) . Enrolled in pharmacy delivery and transitioning to packaging for improved adherence.   Patient Self Care Activities:  . Patient will check blood glucose daily , document, and provide at future appointments . Patient will focus on medication adherence by enrolling in adherence packaging. . Patient will take medications as  prescribed . Patient will contact provider with any episodes of hypoglycemia . Patient will report any questions or concerns to provider   Please see past updates related to this goal by clicking on the "Past Updates" button in the selected goal      . Pharmacy Care Plan - HTN       CARE PLAN ENTRY (see longitudinal plan of care for additional care plan information)  Current Barriers:  . Chronic disease support, education and care coordination needs related to hypertension.  . Current antihypertensive regimen: furosemide, lisinopril-hctz . Previous antihypertensives tried: lisniopril, torsemide . Last practice recorded BP readings:  BP Readings from Last 3 Encounters:  02/01/20 136/72  01/22/20 (!) 94/50  12/31/19 (!) 164/80 .   [name] Current home BP readings: ~138/80. [name] Most recent eGFR/CrCl: No results found for: EGFR  No components found for: CRCL  Pharmacist Clinical Goal(s):  [name] Over the next 30 days, patient will work with PharmD and providers to optimize antihypertensive regimen  Interventions: . Inter-disciplinary care team collaboration (see longitudinal plan of care) . Comprehensive medication review performed; medication list updated in the electronic medical record.  . Continue to check blood pressure twice daily. Call PharmD with any change.   Patient Self Care Activities:  . Patient will continue to check BP twice daily, document, and provide at future appointments . Patient will focus on medication adherence by enrolling in adherence packaging, administering medications as prescribed, and contacting the providers office for any concerns or questions.   Please see past updates related to this goal by clicking on the "Past Updates" button in the selected goal         The patient verbalized understanding of instructions provided today and declined a print copy of patient  instruction materials.   Telephone follow up appointment with pharmacy team member scheduled  for: July 2021  Sherre Poot, PharmD, [facility] Clinical Pharmacist [name] [phone] (office) [phone] (mobile)  Exercises To Do While Sitting  Exercises that you do while sitting (chair exercises) can give you many of the same benefits as full exercise. Benefits include strengthening your heart, burning calories, and keeping muscles and joints healthy. Exercise can also improve your mood and help with depression and anxiety. You may benefit from chair exercises if you are unable to do standing exercises because of:  Diabetic foot pain.  Obesity.  Illness.  Arthritis.  Recovery from surgery or injury.  Breathing problems.  Balance problems.  Another type of disability. Before starting chair exercises, check with your health care provider or a physical therapist to find out how much exercise you can tolerate and which exercises are safe for you. If your health care provider approves:  Start out slowly and build up over time. Aim to work up to about 10-20 minutes for each exercise session.  Make exercise part of your daily routine.  Drink water when you exercise. Do not wait until you are thirsty. Drink every 10-15 minutes.  Stop exercising right away if you have pain, nausea, shortness of breath, or dizziness.  If you are exercising in a wheelchair, make sure to lock the wheels.  Ask your health care provider whether you can do tai chi or yoga. Many positions in these mind-body exercises can be modified to do while seated. Warm-up Before starting other exercises: 1. Sit up as straight as you can. Have your knees bent at 90 degrees, which is the shape of the capital letter "L." Keep your feet flat on the floor. 2. Sit at the front edge of your chair, if you can. 3. Pull in (tighten) the muscles in your abdomen and stretch your spine and neck as straight as you can. Hold this position for a few minutes. 4. Breathe in and out evenly. Try to concentrate  on your breathing, and relax your mind. Stretching Exercise A: Arm stretch 1. Hold your arms out straight in front of your body. 2. Bend your hands at the wrist with your fingers pointing up, as if signaling someone to stop. Notice the slight tension in your forearms as you hold the position. 3. Keeping your arms out and your hands bent, rotate your hands outward as far as you can and hold this stretch. Aim to have your thumbs pointing up and your pinkie fingers pointing down. Slowly repeat arm stretches for one minute as tolerated. Exercise B: Leg stretch 1. If you can move your legs, try to "draw" letters on the floor with the toes of your foot. Write your name with one foot. 2. Write your name with the toes of your other foot. Slowly repeat the movements for one minute as tolerated. Exercise C: Reach for the sky 1. Reach your hands as far over your head as you can to stretch your spine. 2. Move your hands and arms as if you are climbing a rope. Slowly repeat the movements for one minute as tolerated. Range of motion exercises Exercise A: Shoulder roll 1. Let your arms hang loosely at your sides. 2. Lift just your shoulders up toward your ears, then let them relax back down. 3. When your shoulders feel loose, rotate your shoulders in backward and forward circles. Do shoulder rolls slowly for one minute as tolerated. Exercise B: March in place  1. As if you are marching, pump your arms and lift your legs up and down. Lift your knees as high as you can. ? If you are unable to lift your knees, just pump your arms and move your ankles and feet up and down. March in place for one minute as tolerated. Exercise C: Seated jumping jacks 1. Let your arms hang down straight. 2. Keeping your arms straight, lift them up over your head. Aim to point your fingers to the ceiling. 3. While you lift your arms, straighten your legs and slide your heels along the floor to your sides, as wide as you  can. 4. As you bring your arms back down to your sides, slide your legs back together. ? If you are unable to use your legs, just move your arms. Slowly repeat seated jumping jacks for one minute as tolerated. Strengthening exercises Exercise A: Shoulder squeeze 1. Hold your arms straight out from your body to your sides, with your elbows bent and your fists pointed at the ceiling. 2. Keeping your arms in the bent position, move them forward so your elbows and forearms meet in front of your face. 3. Open your arms back out as wide as you can with your elbows still bent, until you feel your shoulder blades squeezing together. Hold for 5 seconds. Slowly repeat the movements forward and backward for one minute as tolerated. Contact a health care provider if you:  Had to stop exercising due to any of the following: ? Pain. ? Nausea. ? Shortness of breath. ? Dizziness. ? Fatigue.  Have significant pain or soreness after exercising. Get help right away if you have:  Chest pain.  Difficulty breathing. These symptoms may represent a serious problem that is an emergency. Do not wait to see if the symptoms will go away. Get medical help right away. Call your local emergency services (911 in the [place] Do not drive yourself to the hospital. This information is not intended to replace advice given to you by your health care provider. Make sure you discuss any questions you have with your health care provider. Document Revised: 01/01/2019 Document Reviewed: 07/24/2017 Elsevier Patient Education  2020 [employer].

## 2020-03-30 ENCOUNTER — Other Ambulatory Visit: Payer: Self-pay

## 2020-03-30 DIAGNOSIS — E1142 Type 2 diabetes mellitus with diabetic polyneuropathy: Secondary | ICD-10-CM

## 2020-03-30 MED ORDER — ALPRAZOLAM 0.5 MG PO TABS: 0.5000 mg | ORAL_TABLET | Freq: Three times a day (TID) | ORAL | 0 refills | Status: DC

## 2020-03-30 MED ORDER — ONETOUCH ULTRASOFT LANCETS MISC
2 refills | Status: DC
Start: 2020-03-30 — End: 2020-03-31

## 2020-03-31 ENCOUNTER — Other Ambulatory Visit: Payer: Self-pay

## 2020-03-31 DIAGNOSIS — E1142 Type 2 diabetes mellitus with diabetic polyneuropathy: Secondary | ICD-10-CM

## 2020-03-31 MED ORDER — ONETOUCH ULTRASOFT LANCETS MISC: 2 refills | Status: DC

## 2020-04-04 ENCOUNTER — Other Ambulatory Visit: Payer: Self-pay

## 2020-04-04 DIAGNOSIS — E1142 Type 2 diabetes mellitus with diabetic polyneuropathy: Secondary | ICD-10-CM

## 2020-04-04 MED ORDER — ONETOUCH ULTRASOFT LANCETS MISC: 2 refills | Status: DC

## 2020-04-07 ENCOUNTER — Other Ambulatory Visit: Payer: Self-pay

## 2020-04-07 MED ORDER — TIZANIDINE HCL 4 MG PO TABS: 4.0000 mg | ORAL_TABLET | Freq: Two times a day (BID) | ORAL | 2 refills | Status: DC

## 2020-04-15 ENCOUNTER — Other Ambulatory Visit: Payer: Self-pay

## 2020-04-15 ENCOUNTER — Ambulatory Visit: Payer: Medicare Other

## 2020-04-15 DIAGNOSIS — E1142 Type 2 diabetes mellitus with diabetic polyneuropathy: Secondary | ICD-10-CM

## 2020-04-15 MED ORDER — ONETOUCH DELICA PLUS LANCET30G MISC: 1.0000 | Freq: Two times a day (BID) | 2 refills | Status: DC

## 2020-04-15 MED ORDER — TRUEPLUS LANCETS 30G MISC
1.0000 | Freq: Two times a day (BID) | 2 refills | Status: DC
Start: 2020-04-15 — End: 2021-06-02

## 2020-04-21 ENCOUNTER — Ambulatory Visit: Payer: Medicare Other

## 2020-04-21 NOTE — Chronic Care Management (AMB) (Deleted)
Chronic Care Management Pharmacy  Name: JIYA KISSINGER  MRN: [id number] DOB: [birth year]  Chief Complaint/ HPI  Sonya Small,  69 y.o. , female presents for their Follow-Up CCM visit with the clinical pharmacist via telephone due to COVID-19 Pandemic.  PCP : [name], MD  Their chronic conditions include: Chronic Diastolic Heart Failure, CAD, PAD, HTN, Hx of stroke, COPD, DM, HLD, Depression, Urge incontinence.   Office Visits: 02/01/2020 - Hold oxybutynin, lyrica, tizanidine due to dizziness and dry mouth.[name]  01/11/2020 - Dr. [name] increased trulicity to 1.5 mg weekly and patient to pick up samples. Changed torsemide to furosemide.  12/31/2019 - Vraylar 1.5 mg added at last visit but not tolerated. Changed to lisinopril 10/12.5 mg daily, increased oxybutynin 5 mg to tid. 12/25/2019 - HTN elevated and edema x1 week at visit. Given amlodipine 10 mg daily, reduce salt, keep blood pressure log, follow up in two weeks, call blood pressure log in one week with Nurse. 12/18/2019 - Furosemide changed to torsemide 20 mg daily. 12/14/2019 - changed PPI to pantoprazole due to interaction with Plavix. 12/07/2019 - Vraylar changed to lamotrigine due to horrible nightmares. 12/04/2019 - Vraylar added. BP in office 136/70   Consult Visit: 01/18/2020 - Minor head injury with concussion. Patient went to ED after falling on a rock at home.  11/19/2019 - Cardiologist visit with Dr. [name]. BP well controlled. Asymptomatic CAD - continue aspirin and Plavix.  10/29/2019 - Patient experienced vertigo and nausea then fell. PCP referred her to ED.   Medications: Outpatient Encounter Medications as of 04/21/2020  Medication Sig Note  . albuterol (VENTOLIN HFA) 108 (90 Base) MCG/ACT inhaler Inhale 2 puffs into the lungs every 4 (four) hours as needed for wheezing or shortness of breath.   . ALPRAZolam (XANAX) 0.5 MG tablet Take 1 tablet (0.5 mg total) by mouth 3 (three) times daily.   [name] amLODipine (NORVASC)  10 MG tablet Take 1 tablet (10 mg total) by mouth daily. (Patient not taking: Reported on 02/08/2020)   . Aspirin-Acetaminophen (GOODYS BODY PAIN PO) Take 1 packet by mouth daily.   . clopidogrel (PLAVIX) 75 MG tablet TAKE ONE (1) TABLET BY MOUTH ONCE DAILY   . diclofenac Sodium (VOLTAREN) 1 % GEL    . Dulaglutide (TRULICITY) 1.5 OB/0.9GG SOPN Inject 1.5 mg into the skin once a week.   . fenofibrate 160 MG tablet TAKE ONE (1) TABLET ONCE DAILY   . fluticasone (FLONASE) 50 MCG/ACT nasal spray Place 1 spray into both nostrils daily.   . Fluticasone-Salmeterol,sensor, (AIRDUO DIGIHALER) 113-14 MCG/ACT AEPB Inhale 1 puff into the lungs in the morning and at bedtime.   . furosemide (LASIX) 40 MG tablet Take 1 tablet (40 mg total) by mouth 2 (two) times daily.   [name] glucose blood (ONETOUCH VERIO) test strip USE DAILY TO CHECK YOUR BLOOD SUGAR (E11.69)   . icosapent Ethyl (VASCEPA) 1 g capsule TAKE 2 CAPSULES TWICE A DAY WITH FOOD   . lamoTRIgine (LAMICTAL) 25 MG tablet Take 2 tablets (50 mg total) by mouth daily.   . lansoprazole (PREVACID) 30 MG capsule Take 1 capsule (30 mg total) by mouth daily.   [name] lisinopril-hydrochlorothiazide (ZESTORETIC) 10-12.5 MG tablet Take 1 tablet by mouth daily.   . metoprolol tartrate (LOPRESSOR) 50 MG tablet Take 50 mg by mouth daily.  02/02/2019: LF 12/24/18  . nitroGLYCERIN (NITROSTAT) 0.4 MG SL tablet Place 1 tablet (0.4 mg total) under the tongue every 5 (five) minutes as needed.   [name]  ONETOUCH VERIO test strip USE DAILY TO CHECK YOUR BLOOD SUGAR (E11.69)   . oxybutynin (DITROPAN) 5 MG tablet One three times a day (Patient not taking: Reported on 02/08/2020)   . oxyCODONE-acetaminophen (PERCOCET) 10-325 MG tablet Take 1 tablet by mouth every 6 (six) hours as needed.   . pantoprazole (PROTONIX) 40 MG tablet Take 1 tablet (40 mg total) by mouth 2 (two) times daily.   . pregabalin (LYRICA) 50 MG capsule Take 1 capsule (50 mg total) by mouth in the morning and at bedtime.  (Patient not taking: Reported on 02/08/2020)   . rosuvastatin (CRESTOR) 20 MG tablet TAKE ONE (1) TABLET BY MOUTH ONCE DAILY   . tiZANidine (ZANAFLEX) 4 MG tablet Take 1 tablet (4 mg total) by mouth in the morning and at bedtime.   . TRUEplus Lancets 30G MISC 1 each by Does not apply route 2 (two) times daily.   . valACYclovir (VALTREX) 1000 MG tablet 2 po bid for one day   . venlafaxine XR (EFFEXOR-XR) 75 MG 24 hr capsule Take 1 capsule (75 mg total) by mouth every morning.   . vitamin B-12 (CYANOCOBALAMIN) 50 MCG tablet Take 50 mcg by mouth in the morning.   . Vitamin D, Cholecalciferol, 10 MCG (400 UNIT) CAPS Take 400 Units by mouth daily.    No facility-administered encounter medications on file as of 04/21/2020.   No Known Allergies  SDOH Screenings   Alcohol Screen:   . Last Alcohol Screening Score (AUDIT):   Depression (PHQ2-9): Medium Risk  . PHQ-2 Score: 9  Financial Resource Strain:   . Difficulty of Paying Living Expenses:   Food Insecurity:   . Worried About Charity fundraiser in the Last Year:   . [employer] of Food in the Last Year:   Housing: [place]   . Last Housing Risk Score: 0  Physical Activity:   . Days of Exercise per Week:   . Minutes of Exercise per Session:   Social Connections:   . Frequency of Communication with Friends and Family:   . Frequency of Social Gatherings with Friends and Family:   . Attends Religious Services:   . Active Member of Clubs or Organizations:   . Attends Archivist Meetings:   [name] Marital Status:   Stress: Stress Concern Present  . Feeling of Stress : Rather much  Tobacco Use: High Risk  . Smoking Tobacco Use: Current Every Day Smoker  . Smokeless Tobacco Use: Never Used  Transportation Needs: No Transportation Needs  . Lack of Transportation (Medical): No  . Lack of Transportation (Non-Medical): No      Current Diagnosis/Assessment:  Goals Addressed   None    Diabetes   Recent Relevant Labs: Lab Results    Component Value Date/Time   HGBA1C 7.1 (H) 12/04/2019 10:20 AM   HGBA1C 7.6 (H) 02/01/2019 04:37 AM   MICROALBUR 30 12/04/2019 10:43 AM    Lab Results  Component Value Date   CREATININE 0.62 12/04/2019   CREATININE 0.71 02/06/2019   CREATININE 0.76 02/06/2019   Checking BG: Daily  Recent FBG Readings: 112 mg/dL Patient has failed these meds in past: farxiga 5 mg, metformin xr 750 mg, janumet 50-500 mg bid. Patient is currently uncontrolled on the following medications: trulicity 1.5 mg weekly,  Last diabetic Foot exam: 12/16/2019 Last diabetic Eye exam: Can't recall  We discussed: Patient's Trulicity increased to 1.5 mg/week. Her blood sugar today is 112 mg/dL She complains of feeling tired but her  blood sugar has been stable at this reading for the past week.  Update 03/17/2020 - Patient's nephew passed away. She has been under a lot of stress. BS today 148 mg/dL. 142, 123, 156, 151, 144, 148, 149 in the past week in the am. Patient is not hungry right now due to stress of loss. She isn't eating regular meals or as healthy as normal but blood sugar is stable.  Plan  Continue current medications,   Hypertension   BP today is:  112/78 mmHg  Office blood pressures are  BP Readings from Last 3 Encounters:  02/01/20 136/72  01/22/20 (!) 94/50  12/31/19 (!) 164/80   Patient has failed these meds in the past: hctz, torsemide, lisinopril, amlodipine Patient is currently uncontrolled on the following medications:  furosemide 40 mg twice daily, lisinopril- hctz 10-12.5 mg, metoprolol tartrate 50 mg  Patient checks BP at home daily  Patient home BP readings are ranging: 94/50 mmHg  We discussed diet and exercise extensively. Patient does all the cooking. She said she doesn't eat much and doesn't salt. Likes to eat broccoli and cheese, chicken baked in oven, Brother comes to eat every day with patient and her sister. Cooks pinto beans and green beans. Rinses chicken noodle soup  because of how salty is.  01/22/2020 Patient's blood pressure is low in office today. Patient reports that her head feels unclear but attributed that to resolving concussion. Patient's medications were reviewed thoroughly in office today. Her medication routine at home seems somewhat unclear. Patient to be enrolled in packaging to improve consistency of routine. Patient's discontinued medications removed from regular medicine and taken for proper disposal.    01/25/2020: Patient's home readings are improving. She states that she feels good. Was tired last night when she checked evening bp and it was lower.  114/77 and 113/71 mmHg on 05/01.  119/73 and 93/57 mmHg on 05/02.   02/08/2020 - BP 112/78 mmHg  Update 03/17/2020 - Patient has been staying with family. Due to stress of death in family patient has not been eating normally. She is eating things like hamburger, fries, cheese puffs, and pork skins. BP has been 138/87 mmHg when checked lately. Patient is complaining of random pain in left arm and under breast. She can't identify what brings it on. It is relieved easily by drinking cold water. Pharmacist encouraged her to schedule a visit to be seen in office to address. Patient is deferring until after funeral and life settles down. She states she is broken hearted about her nephew and unable to cry.   Plan  Continue to check blood pressure twice daily and continue current medications.     Medication Management   Pt uses [facility]  Uses pill box? No transitioning to packaging.   We discussed: Packaging to improve adherence. Patient is a little confused on medications again. She is going to set up a time to come in and review with pharmacist once funeral is over.   Verbal consent obtained for [facility] enhanced pharmacy services (medication synchronization, adherence packaging, delivery coordination). A medication sync plan was created to allow patient to get all  medications delivered once every 30 to 90 days per patient preference. Patient understands they have freedom to choose pharmacy and clinical pharmacist will coordinate care between all prescribers and [facility].    Plan  Utilize [facility] for medication synchronization, packaging and delivery    Follow up: 1 month phone visit

## 2020-04-22 ENCOUNTER — Ambulatory Visit: Payer: Self-pay

## 2020-04-22 ENCOUNTER — Other Ambulatory Visit: Payer: Self-pay

## 2020-04-22 DIAGNOSIS — E1169 Type 2 diabetes mellitus with other specified complication: Secondary | ICD-10-CM

## 2020-04-22 DIAGNOSIS — I10 Essential (primary) hypertension: Secondary | ICD-10-CM

## 2020-04-22 MED ORDER — VALACYCLOVIR HCL 1 G PO TABS: ORAL_TABLET | ORAL | 2 refills | Status: DC

## 2020-04-22 NOTE — Patient Instructions (Signed)
Visit Information  Goals Addressed            This Visit's Progress   . Pharmacy Care Plan - DM       CARE PLAN ENTRY (see longitudinal plan of care for additional care plan information)  Current Barriers:  . Diabetes: complicated by chronic medical conditions including HTN, HLD. Lab Results  Component Value Date   HGBA1C 7.1 (H) 12/04/2019 .   Lab Results  Component Value Date   CREATININE 0.62 12/04/2019   CREATININE 0.71 02/06/2019   CREATININE 0.76 02/06/2019 .   [name] No results found for: EGFR . Current antihyperglycemic regimen: Trulicity 1.5 mg . Reports denies hypoglycemic symptoms, including dizziness, lightheadedness, shaking, sweating . Denies hyperglycemic symptoms, including polyuria, polydipsia, polyphagia, nocturia, blurred vision, neuropathy . Current meal patterns: o Lunch:baked meats and vegetables o Supper:broccoli and cheese, chicken noodle soup o Drinks sugar free twist . Current exercise: none . Current blood glucose readings: 116 mg/dL this am . Cardiovascular risk reduction: o Current hypertensive regimen:lisinopril-hctz, furosemide, metoprolol o Current hyperlipidemia regimen: rosuvastatin 20 mg, fenofibrate 160 mg, vascepa o Current antiplatelet regimen:plavix  Pharmacist Clinical Goal(s):  [name] Over the next 30 days, patient will work with PharmD and primary care provider to address blood sugar control and medication adherence.   Interventions: . Comprehensive medication review performed, medication list updated in electronic medical record . Inter-disciplinary care team collaboration (see longitudinal plan of care) . Enrolled in pharmacy delivery and transitioning to packaging for improved adherence.  . Assisting patient with obtaining testing supplies through Medicare part B benefit at [facility].   Patient Self Care Activities:  . Patient will check blood glucose daily , document, and provide at future appointments . Patient will focus on medication  adherence by enrolling in adherence packaging. . Patient will take medications as prescribed . Patient will contact provider with any episodes of hypoglycemia . Patient will report any questions or concerns to provider   Please see past updates related to this goal by clicking on the "Past Updates" button in the selected goal         The patient verbalized understanding of instructions provided today and declined a print copy of patient instruction materials.   Telephone follow up appointment with pharmacy team member scheduled for: 04/2020  Sherre Poot, PharmD, [facility] Clinical Pharmacist [name] [phone] (office) [phone] (mobile)

## 2020-04-22 NOTE — Chronic Care Management (AMB) (Signed)
Chronic Care Management Pharmacy  Name: Sonya Small  MRN: [id number] DOB: 1950-11-08  Chief Complaint/ HPI  Sonya Small,  69 y.o. , female presents for their Follow-Up CCM visit with the clinical pharmacist via telephone due to COVID-19 Pandemic.  PCP : Sonya Brome, MD  Their chronic conditions include: Chronic Diastolic Heart Failure, CAD, PAD, HTN, Hx of stroke, COPD, DM, HLD, Depression, Urge incontinence.   Office Visits: 02/01/2020 - Hold oxybutynin, lyrica, tizanidine due to dizziness and dry mouth.[name]  01/11/2020 - Dr. Tobie Small increased trulicity to 1.5 mg weekly and patient to pick up samples. Changed torsemide to furosemide.  12/31/2019 - Vraylar 1.5 mg added at last visit but not tolerated. Changed to lisinopril 10/12.5 mg daily, increased oxybutynin 5 mg to tid. 12/25/2019 - HTN elevated and edema x1 week at visit. Given amlodipine 10 mg daily, reduce salt, keep blood pressure log, follow up in two weeks, call blood pressure log in one week with Nurse. 12/18/2019 - Furosemide changed to torsemide 20 mg daily. 12/14/2019 - changed PPI to pantoprazole due to interaction with Plavix. 12/07/2019 - Vraylar changed to lamotrigine due to horrible nightmares. 12/04/2019 - Vraylar added. BP in office 136/70   Consult Visit: 01/18/2020 - Minor head injury with concussion. Patient went to ED after falling on a rock at home.  11/19/2019 - Cardiologist visit with Dr. [name]. BP well controlled. Asymptomatic CAD - continue aspirin and Plavix.  10/29/2019 - Patient experienced vertigo and nausea then fell. PCP referred her to ED.   Medications: Outpatient Encounter Medications as of 04/22/2020  Medication Sig Note   albuterol (VENTOLIN HFA) 108 (90 Base) MCG/ACT inhaler Inhale 2 puffs into the lungs every 4 (four) hours as needed for wheezing or shortness of breath.    ALPRAZolam (XANAX) 0.5 MG tablet Take 1 tablet (0.5 mg total) by mouth 3 (three) times daily.    Aspirin-Acetaminophen  (GOODYS BODY PAIN PO) Take 1 packet by mouth daily.    clopidogrel (PLAVIX) 75 MG tablet TAKE ONE (1) TABLET BY MOUTH ONCE DAILY    Dulaglutide (TRULICITY) 1.5 OH/7.2BM SOPN Inject 1.5 mg into the skin once a week.    fenofibrate 160 MG tablet TAKE ONE (1) TABLET ONCE DAILY    fluticasone (FLONASE) 50 MCG/ACT nasal spray Place 1 spray into both nostrils daily.    Fluticasone-Salmeterol,sensor, (AIRDUO DIGIHALER) 113-14 MCG/ACT AEPB Inhale 1 puff into the lungs in the morning and at bedtime.    furosemide (LASIX) 40 MG tablet Take 1 tablet (40 mg total) by mouth 2 (two) times daily.    glucose blood (ONETOUCH VERIO) test strip USE DAILY TO CHECK YOUR BLOOD SUGAR (E11.69)    icosapent Ethyl (VASCEPA) 1 g capsule TAKE 2 CAPSULES TWICE A DAY WITH FOOD    lamoTRIgine (LAMICTAL) 25 MG tablet Take 2 tablets (50 mg total) by mouth daily.    lansoprazole (PREVACID) 30 MG capsule Take 1 capsule (30 mg total) by mouth daily.    lisinopril-hydrochlorothiazide (ZESTORETIC) 10-12.5 MG tablet Take 1 tablet by mouth daily.    metoprolol tartrate (LOPRESSOR) 50 MG tablet Take 50 mg by mouth daily.  02/02/2019: LF 12/24/18   nitroGLYCERIN (NITROSTAT) 0.4 MG SL tablet Place 1 tablet (0.4 mg total) under the tongue every 5 (five) minutes as needed.    ONETOUCH VERIO test strip USE DAILY TO CHECK YOUR BLOOD SUGAR (E11.69)    oxyCODONE-acetaminophen (PERCOCET) 10-325 MG tablet Take 1 tablet by mouth every 6 (six) hours as needed.  rosuvastatin (CRESTOR) 20 MG tablet TAKE ONE (1) TABLET BY MOUTH ONCE DAILY    tiZANidine (ZANAFLEX) 4 MG tablet Take 1 tablet (4 mg total) by mouth in the morning and at bedtime. (Patient taking differently: Take 2 mg by mouth at bedtime. )    TRUEplus Lancets 30G MISC 1 each by Does not apply route 2 (two) times daily.    valACYclovir (VALTREX) 1000 MG tablet 2 po bid for one day    venlafaxine XR (EFFEXOR-XR) 75 MG 24 hr capsule Take 1 capsule (75 mg total) by mouth  every morning.    amLODipine (NORVASC) 10 MG tablet Take 1 tablet (10 mg total) by mouth daily. (Patient not taking: Reported on 02/08/2020)    diclofenac Sodium (VOLTAREN) 1 % GEL  (Patient not taking: Reported on 04/22/2020)    oxybutynin (DITROPAN) 5 MG tablet One three times a day (Patient not taking: Reported on 02/08/2020)    pantoprazole (PROTONIX) 40 MG tablet Take 1 tablet (40 mg total) by mouth 2 (two) times daily. (Patient not taking: Reported on 04/22/2020)    pregabalin (LYRICA) 50 MG capsule Take 1 capsule (50 mg total) by mouth in the morning and at bedtime. (Patient not taking: Reported on 02/08/2020)    vitamin B-12 (CYANOCOBALAMIN) 50 MCG tablet Take 50 mcg by mouth in the morning. (Patient not taking: Reported on 04/22/2020)    Vitamin D, Cholecalciferol, 10 MCG (400 UNIT) CAPS Take 400 Units by mouth daily. (Patient not taking: Reported on 04/22/2020)    No facility-administered encounter medications on file as of 04/22/2020.   No Known Allergies  SDOH Screenings   Alcohol Screen:    Last Alcohol Screening Score (AUDIT):   Depression (PHQ2-9): Medium Risk   PHQ-2 Score: 9  Financial Resource Strain:    Difficulty of Paying Living Expenses:   Food Insecurity:    Worried About Charity fundraiser in the Last Year:    Arboriculturist in the Last Year:   Housing: Low Risk    Last Housing Risk Score: 0  Physical Activity:    Days of Exercise per Week:    Minutes of Exercise per Session:   Social Connections:    Frequency of Communication with Friends and Family:    Frequency of Social Gatherings with Friends and Family:    Attends Religious Services:    Active Member of Clubs or Organizations:    Attends Music therapist:    Marital Status:   Stress: Stress Concern Present   Feeling of Stress : Rather much  Tobacco Use: High Risk   Smoking Tobacco Use: Current Every Day Smoker   Smokeless Tobacco Use: Never Used  Transportation  Needs: No Transportation Needs   Lack of Transportation (Medical): No   Lack of Transportation (Non-Medical): No      Current Diagnosis/Assessment:  Goals Addressed            This Visit's Progress    Pharmacy Care Plan - DM       CARE PLAN ENTRY (see longitudinal plan of care for additional care plan information)  Current Barriers:   Diabetes: complicated by chronic medical conditions including HTN, HLD. Lab Results  Component Value Date   HGBA1C 7.1 (H) 12/04/2019    Lab Results  Component Value Date   CREATININE 0.62 12/04/2019   CREATININE 0.71 02/06/2019   CREATININE 0.76 02/06/2019     No results found for: EGFR  Current antihyperglycemic regimen: Trulicity 1.5 mg  Reports denies hypoglycemic symptoms, including dizziness, lightheadedness, shaking, sweating  Denies hyperglycemic symptoms, including polyuria, polydipsia, polyphagia, nocturia, blurred vision, neuropathy  Current meal patterns: o Lunch:baked meats and vegetables o Supper:broccoli and cheese, chicken noodle soup o Drinks sugar free twist  Current exercise: none  Current blood glucose readings: 116 mg/dL this am  Cardiovascular risk reduction: o Current hypertensive regimen:lisinopril-hctz, furosemide, metoprolol o Current hyperlipidemia regimen: rosuvastatin 20 mg, fenofibrate 160 mg, vascepa o Current antiplatelet regimen:plavix  Pharmacist Clinical Goal(s):   Over the next 30 days, patient will work with PharmD and primary care provider to address blood sugar control and medication adherence.   Interventions:  Comprehensive medication review performed, medication list updated in electronic medical record  Inter-disciplinary care team collaboration (see longitudinal plan of care)  Enrolled in pharmacy delivery and transitioning to packaging for improved adherence.   Assisting patient with obtaining testing supplies through Medicare part B benefit at [facility].   Patient Self Care  Activities:   Patient will check blood glucose daily , document, and provide at future appointments  Patient will focus on medication adherence by enrolling in adherence packaging.  Patient will take medications as prescribed  Patient will contact provider with any episodes of hypoglycemia  Patient will report any questions or concerns to provider   Please see past updates related to this goal by clicking on the "Past Updates" button in the selected goal        Diabetes   Recent Relevant Labs: Lab Results  Component Value Date/Time   HGBA1C 7.1 (H) 12/04/2019 10:20 AM   HGBA1C 7.6 (H) 02/01/2019 04:37 AM   MICROALBUR 30 12/04/2019 10:43 AM    Lab Results  Component Value Date   CREATININE 0.62 12/04/2019   CREATININE 0.71 02/06/2019   CREATININE 0.76 02/06/2019   Checking BG: Daily  Recent FBG Readings: 112 mg/dL Patient has failed these meds in past: farxiga 5 mg, metformin xr 750 mg, janumet 50-500 mg bid. Patient is currently uncontrolled on the following medications: trulicity 1.5 mg weekly,  Last diabetic Foot exam: 12/16/2019 Last diabetic Eye exam: Can't recall  We discussed: Patient's Trulicity increased to 1.5 mg/week. Her blood sugar today is 112 mg/dL She complains of feeling tired but her blood sugar has been stable at this reading for the past week.  Update 03/17/2020 - Patient's nephew passed away. She has been under a lot of stress. BS today 148 mg/dL. 142, 123, 156, 151, 144, 148, 149 in the past week in the am. Patient is not hungry right now due to stress of loss. She isn't eating regular meals or as healthy as normal but blood sugar is stable.  Update 04/22/2020 - Patient's blood sugar is fluctuating still. Recent readings of 141 and 171 mg/dL after having pintos, okra and the higher reading had potatoes with the pintos and okra. Pharmacist educated patient on carbohydrate intake and driving up blood sugar. Counseled patient on the need for a visit with  Dr. Tobie Small for updated labs to determine if A1C warrants increasing Trulicity. Patient has been very busy cleaning out her nephews house. Patient does not have a working lancet device currently. Pharmacist is coordinating with [facility] to obtain updated lancing device through Medicare B.   Plan  Continue current medications,   Hypertension   BP today is:  112/78 mmHg  Office blood pressures are  BP Readings from Last 3 Encounters:  02/01/20 136/72  01/22/20 (!) 94/50  12/31/19 (!) 164/80   Patient has failed these  meds in the past: hctz, torsemide, lisinopril, amlodipine Patient is currently uncontrolled on the following medications:  furosemide 40 mg twice daily, lisinopril- hctz 10-12.5 mg, metoprolol tartrate 50 mg  Patient checks BP at home daily  Patient home BP readings are ranging: 94/50 mmHg  We discussed diet and exercise extensively. Patient does all the cooking. She said she doesn't eat much and doesn't salt. Likes to eat broccoli and cheese, chicken baked in oven, Brother comes to eat every day with patient and her sister. Cooks pinto beans and green beans. Rinses chicken noodle soup because of how salty is.  01/22/2020 Patient's blood pressure is low in office today. Patient reports that her head feels unclear but attributed that to resolving concussion. Patient's medications were reviewed thoroughly in office today. Her medication routine at home seems somewhat unclear. Patient to be enrolled in packaging to improve consistency of routine. Patient's discontinued medications removed from regular medicine and taken for proper disposal.    01/25/2020: Patient's home readings are improving. She states that she feels good. Was tired last night when she checked evening bp and it was lower.  114/77 and 113/71 mmHg on 05/01.  119/73 and 93/57 mmHg on 05/02.   02/08/2020 - BP 112/78 mmHg  Update 03/17/2020 - Patient has been staying with family. Due to stress of death in family patient  has not been eating normally. She is eating things like hamburger, fries, cheese puffs, and pork skins. BP has been 138/87 mmHg when checked lately. Patient is complaining of random pain in left arm and under breast. She can't identify what brings it on. It is relieved easily by drinking cold water. Pharmacist encouraged her to schedule a visit to be seen in office to address. Patient is deferring until after funeral and life settles down. She states she is broken hearted about her nephew and unable to cry.   Update 04/22/2020 - Patient reports that her blood pressure still fluctuates. She has her log to bring in to next visit with Dr. Tobie Small. There are times that it is 99/55 mmHg. She denies any dizziness or falls. She has been getting lots of physical activity cleaning out her late nephew's home.  Plan  Continue to check blood pressure twice daily and continue current medications. Bring BP log to next visit with Dr. Tobie Small.    Medication Management   Pt uses [facility]  Uses pill box? No transitioning to packaging.   We discussed: Packaging to improve adherence. Patient is a little confused on medications again. She is going to set up a time to come in and review with pharmacist once funeral is over.   Verbal consent obtained for [facility] enhanced pharmacy services (medication synchronization, adherence packaging, delivery coordination). A medication sync plan was created to allow patient to get all medications delivered once every 30 to 90 days per patient preference. Patient understands they have freedom to choose pharmacy and clinical pharmacist will coordinate care between all prescribers and [facility].  Update 04/22/2020 - Patient is getting first delivery of packaging on 08/02 to begin taking 08/06. Pharmacist will continue to assist patient with the transition.     Plan  Utilize [facility] for medication synchronization, packaging and delivery    Follow  up: 1 month phone visit

## 2020-04-28 ENCOUNTER — Other Ambulatory Visit: Payer: Self-pay | Admitting: Family Medicine

## 2020-05-09 ENCOUNTER — Ambulatory Visit (INDEPENDENT_AMBULATORY_CARE_PROVIDER_SITE_OTHER): Payer: Medicare Other | Admitting: Family Medicine

## 2020-05-09 ENCOUNTER — Encounter: Payer: Self-pay | Admitting: Family Medicine

## 2020-05-09 ENCOUNTER — Other Ambulatory Visit: Payer: Self-pay

## 2020-05-09 VITALS — BP 120/70 | HR 92 | Temp 97.1°F | Resp 18 | Ht 65.0 in | Wt 161.6 lb

## 2020-05-09 DIAGNOSIS — I2510 Atherosclerotic heart disease of native coronary artery without angina pectoris: Secondary | ICD-10-CM

## 2020-05-09 DIAGNOSIS — I110 Hypertensive heart disease with heart failure: Secondary | ICD-10-CM

## 2020-05-09 DIAGNOSIS — E1169 Type 2 diabetes mellitus with other specified complication: Secondary | ICD-10-CM

## 2020-05-09 DIAGNOSIS — L819 Disorder of pigmentation, unspecified: Secondary | ICD-10-CM | POA: Insufficient documentation

## 2020-05-09 DIAGNOSIS — E782 Mixed hyperlipidemia: Secondary | ICD-10-CM

## 2020-05-09 DIAGNOSIS — I5042 Chronic combined systolic (congestive) and diastolic (congestive) heart failure: Secondary | ICD-10-CM

## 2020-05-09 DIAGNOSIS — E781 Pure hyperglyceridemia: Secondary | ICD-10-CM

## 2020-05-09 DIAGNOSIS — F332 Major depressive disorder, recurrent severe without psychotic features: Secondary | ICD-10-CM | POA: Insufficient documentation

## 2020-05-09 NOTE — Progress Notes (Signed)
Subjective:  Patient ID: Sonya Small, female    DOB: Feb 05, 1951  Age: 69 y.o. MRN: [id number]  Chief Complaint  Patient presents with  . Hypertension  . Diabetes    HPI  Sonya Small presents with type 2 diabetes mellitus with other specified complication.  Compliance with treatment has been good; she takes her medication as directed and follows up as directed.  Typical diet includes low carbohydrate.  No exercise. Current meds include an oral hypoglycemic ( Trulicity 1.5 mg once weekly).  Home blood glucose monitoring 119-181.  In regard to preventative care, she performs foot self-exams daily and her last ophthalmology exam was in 05/20/2019.  Had a left great toenail ingrown removed. Dr Ronnald Ramp [facility]. She is having a rash on both feet. Dry and itchy. Using gold bond diabetic cream and soak for 15 minutes twice a day.     Dx with atherosclerotic heart disease of native coronary artery without angina pectoris; she is here today for routine follow-up.  Sonya Small has a prior history of a myocardial infarction and is currently on a beta blocker.  Her heart disease was first diagnosed more than 5 years ago.  Currently, her treatment regimen consists of daily 81 mg aspirin,  Lopressor, and Crestor, fenofibrate, and vascepa.  No associated symptoms are reported.      Pt presents with hyperlipidemia.  Current treatment includes Crestor, Tricor, and Vascepa.  Compliance with treatment has been good; she takes her medication as directed, maintains her low cholesterol diet, follows up as directed, and maintains her exercise regimen.  She denies experiencing any hypercholesterolemia related symptoms.      Hypertension was first diagnosed several years ago.  Her current cardiac medication regimen includes a beta-blocker ( metoprolol ), amlodipine and Lasix.  Sonya Small's blood pressure log shows her bp any where from 91/61 to 132/87. Complaining of dizziness. Patient does not see a link with low bps.      Simple chronic  bronchitis details; the duration of COPD has been several years.  Currently on Airduo and ventolin.  Breathing is good.   Current Outpatient Medications on File Prior to Visit  Medication Sig Dispense Refill  . albuterol (VENTOLIN HFA) 108 (90 Base) MCG/ACT inhaler Inhale 2 puffs into the lungs every 4 (four) hours as needed for wheezing or shortness of breath. 18 g 2  . ALPRAZolam (XANAX) 0.5 MG tablet Take 1 tablet (0.5 mg total) by mouth 3 (three) times daily. 90 tablet 0  . Aspirin-Acetaminophen (GOODYS BODY PAIN PO) Take 1 packet by mouth daily.    . clopidogrel (PLAVIX) 75 MG tablet TAKE ONE (1) TABLET BY MOUTH ONCE DAILY 90 tablet 1  . diclofenac Sodium (VOLTAREN) 1 % GEL     . fenofibrate 160 MG tablet TAKE ONE (1) TABLET ONCE DAILY 90 tablet 1  . fluticasone (FLONASE) 50 MCG/ACT nasal spray Place 1 spray into both nostrils daily. 16 g 1  . Fluticasone-Salmeterol,sensor, (AIRDUO DIGIHALER) 113-14 MCG/ACT AEPB Inhale 1 puff into the lungs in the morning and at bedtime.    . furosemide (LASIX) 40 MG tablet Take 1 tablet (40 mg total) by mouth 2 (two) times daily. 180 tablet 1  . glucose blood (ONETOUCH VERIO) test strip USE DAILY TO CHECK YOUR BLOOD SUGAR (E11.69)    . icosapent Ethyl (VASCEPA) 1 g capsule TAKE 2 CAPSULES TWICE A DAY WITH FOOD 360 capsule 1  . lamoTRIgine (LAMICTAL) 25 MG tablet Take 2 tablets (50 mg total) by mouth  daily. 180 tablet 1  . lansoprazole (PREVACID) 30 MG capsule Take 1 capsule (30 mg total) by mouth daily. 90 capsule 1  . metoprolol tartrate (LOPRESSOR) 50 MG tablet Take 50 mg by mouth daily.     . nitroGLYCERIN (NITROSTAT) 0.4 MG SL tablet Place 1 tablet (0.4 mg total) under the tongue every 5 (five) minutes as needed. 25 tablet 1  . ONETOUCH VERIO test strip USE DAILY TO CHECK YOUR BLOOD SUGAR (E11.69) 100 each 3  . oxybutynin (DITROPAN) 5 MG tablet One three times a day 270 tablet 1  . oxyCODONE-acetaminophen (PERCOCET) 10-325 MG tablet Take 1 tablet by  mouth every 6 (six) hours as needed.    . rosuvastatin (CRESTOR) 20 MG tablet TAKE ONE (1) TABLET BY MOUTH ONCE DAILY 90 tablet 1  . TRUEplus Lancets 30G MISC 1 each by Does not apply route 2 (two) times daily. 100 each 2  . valACYclovir (VALTREX) 1000 MG tablet 2 po bid for one day 10 tablet 2  . venlafaxine XR (EFFEXOR-XR) 75 MG 24 hr capsule Take 1 capsule (75 mg total) by mouth every morning. 90 capsule 1  . Vitamin D, Cholecalciferol, 10 MCG (400 UNIT) CAPS Take 400 Units by mouth daily.     [name] tiZANidine (ZANAFLEX) 4 MG tablet Take 1 tablet (4 mg total) by mouth in the morning and at bedtime. (Patient taking differently: Take 2 mg by mouth at bedtime. ) 180 tablet 2   No current facility-administered medications on file prior to visit.   Past Medical History:  Diagnosis Date  . Anxiety   . CAD (coronary artery disease)    a. NSTEMI 11/2008 s/p DES to LCx (3.0x12 Xience); b. NSTEMI 01/2010 secondary to thrombotic RCA lesion (non-obstructive)-->med rx (integrilin x 24 hrs + plavix); c. 09/2012 negative Myoview.  . Chronic diastolic CHF (congestive heart failure) ([place])    a. 06/2014 Echo: EF 55-60%, no rwma, Gr1 DD, mild AI.  [name] Depression   . Dizziness   . Drug induced constipation   . Generalized hyperhidrosis   . GERD (gastroesophageal reflux disease)   . Headache   . Hyperlipidemia   . Hypertensive heart disease   . Lumbar disc disease   . Metabolic encephalopathy   . Mixed hyperlipidemia   . Myocardial infarction ([place])   . Obstructive sleep apnea   . OP (osteoporosis)   . Osteoarthritis   . Osteoporosis   . Other malaise   . Overweight(278.02)   . PAD (peripheral artery disease) ([place])    a. Emboli to R foot 2010 from partially occlusive lesion in R EIA, s/p stenting. - followed by Dr. Donnetta Hutching;  b. 10/2015 ABIs: R 1.03, L 0.97.  [name] Restless leg   . Sleep apnea   . Stroke ([place])   . TIA (transient ischemic attack)   . Tobacco abuse   . Urge incontinence    Past Surgical  History:  Procedure Laterality Date  . EYE SURGERY     at age 61  . hysterectomy -age 87    . ILIAC ARTERY STENT     RIGHT ILIAC STENT  . KNEE ARTHROSCOPY    . LUMBAR LAMINECTOMY    . TUBAL LIGATION      Family History  Problem Relation Age of Onset  . Alzheimer's disease Mother   . Cancer Brother   . Heart disease Other        Grandfather  . Hepatitis C Brother   . Diabetes Brother   .  Thyroid disease Brother   . Hypertension Brother   . Depression Brother    Social History   Socioeconomic History  . Marital status: Divorced    Spouse name: Not on file  . Number of children: 3  . Years of education: 10 th  . Highest education level: Not on file  Occupational History  . Occupation: DISABLED    Employer: UNEMPLOYED  Tobacco Use  . Smoking status: Current Every Day Smoker    Packs/day: 0.50    Types: Cigarettes    Last attempt to quit: 11/20/2012    Years since quitting: 7.4  . Smokeless tobacco: Never Used  Substance and Sexual Activity  . Alcohol use: No    Alcohol/week: 0.0 standard drinks  . Drug use: No  . Sexual activity: Never  Other Topics Concern  . Not on file  Social History Narrative   Patient is single with 3 children, 1 deceased.   Patient is right handed.   Patient has 10 th grade education.   Patient drinks 5 or more cups daily.   Social Determinants of Health   Financial Resource Strain:   . Difficulty of Paying Living Expenses: Not on file  Food Insecurity:   . Worried About Charity fundraiser in the Last Year: Not on file  . Ran Out of Food in the Last Year: Not on file  Transportation Needs: No Transportation Needs  . Lack of Transportation (Medical): No  . Lack of Transportation (Non-Medical): No  Physical Activity:   . Days of Exercise per Week: Not on file  . Minutes of Exercise per Session: Not on file  Stress: Stress Concern Present  . Feeling of Stress : Rather much  Social Connections:   . Frequency of Communication with  Friends and Family: Not on file  . Frequency of Social Gatherings with Friends and Family: Not on file  . Attends Religious Services: Not on file  . Active Member of Clubs or Organizations: Not on file  . Attends Archivist Meetings: Not on file  . Marital Status: Not on file    Review of Systems  Constitutional: Positive for fatigue. Negative for chills and fever.  HENT: Negative for congestion, rhinorrhea and sore throat.   Respiratory: Positive for shortness of breath (worse with exertion. ). Negative for cough.   Cardiovascular: Negative for chest pain, palpitations and leg swelling.  Gastrointestinal: Positive for constipation and diarrhea. Negative for abdominal pain, nausea and vomiting.  Genitourinary: Positive for frequency (takes ditropan ) and urgency. Negative for dysuria.  Musculoskeletal: Positive for arthralgias (right hip pain ). Negative for back pain, joint swelling and myalgias.  Neurological: Positive for dizziness, weakness and headaches. Negative for light-headedness.  Psychiatric/Behavioral: Positive for dysphoric mood. The patient is nervous/anxious.      Objective:  BP 120/70   Pulse 92   Temp (!) 97.1 F (36.2 C)   Resp 18   Ht 5\' 5"  (1.651 m)   Wt 161 lb 9.6 oz (73.3 kg)   BMI 26.89 kg/m   BP/Weight 05/09/2020 02/01/2020 1/76/1607  Systolic BP 371 062 94  Diastolic BP 70 72 50  Wt. (Lbs) 161.6 164 -  BMI 26.89 27.29 -    Physical Exam Vitals reviewed.  Constitutional:      Appearance: Normal appearance. She is obese.  Neck:     Vascular: No carotid bruit.  Cardiovascular:     Rate and Rhythm: Normal rate and regular rhythm.     Heart  sounds: Normal heart sounds.  Pulmonary:     Effort: Pulmonary effort is normal. No respiratory distress.     Breath sounds: Normal breath sounds.  Abdominal:     General: Abdomen is flat. Bowel sounds are normal.     Palpations: Abdomen is soft.     Tenderness: There is no abdominal tenderness.    Musculoskeletal:     Right lower leg: Edema present.     Left lower leg: Edema present.  Skin:    Findings: Lesion (pigmented lesion - changing.) and rash (very dry feet.) present.  Neurological:     Mental Status: She is alert and oriented to person, place, and time.  Psychiatric:        Mood and Affect: Mood normal.        Behavior: Behavior normal.     Diabetic Foot Exam - Simple   Simple Foot Form Diabetic Foot exam was performed with the following findings: Yes 05/09/2020  7:05 PM  Visual Inspection See comments: Yes Sensation Testing Intact to touch and monofilament testing bilaterally: Yes Pulse Check See comments: Yes Comments Decreased pulses BL. Shiny pink skin on feet and lower extremities. Calluses on bottom of feet.       Lab Results  Component Value Date   WBC 12.3 (H) 05/09/2020   HGB 15.5 05/09/2020   HCT 46.7 (H) 05/09/2020   PLT 322 05/09/2020   GLUCOSE 155 (H) 05/09/2020   CHOL 167 05/09/2020   TRIG 268 (H) 05/09/2020   HDL 41 05/09/2020   LDLCALC 82 05/09/2020   ALT 21 05/09/2020   AST 21 05/09/2020   NA 139 05/09/2020   K 3.7 05/09/2020   CL 96 05/09/2020   CREATININE 0.95 05/09/2020   BUN 33 (H) 05/09/2020   CO2 28 05/09/2020   TSH 0.619 12/04/2019   INR 1.0 01/31/2019   HGBA1C 7.4 (H) 05/09/2020   MICROALBUR 30 12/04/2019      Assessment & Plan:   1. Coronary artery disease involving native coronary artery of native heart without angina pectoris Well controlled. Keep follow up appointments with cardiology.  No changes to medicines.  Continue to work on eating a healthy diet and exercise.  Labs drawn today.   2. Mixed hyperlipidemia Not at goal, but on maximum treatments.  No changes to medicines.  Continue to work on eating a healthy diet and exercise.  Labs drawn today.  - Lipid panel  3. Hypertensive heart disease with chronic combined systolic and diastolic congestive heart failure ([place]) Well controlled. Bps are running  too low at times.  Stop amlodipine. Stop Lasix (furosemide) Continue to work on eating a healthy diet and exercise.  Labs drawn today.  - CBC with Differential/Platelet - Comprehensive metabolic panel  4. Type 2 diabetes mellitus with hypertriglyceridemia (HCC) Control: worsened, but has had changes to her medicines in the last 3 months. Recommend check sugars fasting daily. Recommend check feet daily. Recommend annual eye exams. Medicines: Increase trulicity to 3 mg once weekly. Continue to work on eating a healthy diet and exercise.  Labs drawn today.   - Hemoglobin A1c - POCT UA - Microalbumin  5. Atypical pigmented skin lesion - Ambulatory referral to Dermatology  6. Severe recurrent major depression without psychotic features [facility]) Contracts for safety.  No changes to medicines. Needs to see psychiatry for management. - Ambulatory referral to Psychiatry [facility] or [facility])   Orders Placed This Encounter  Procedures  . CBC with Differential/Platelet  . Comprehensive metabolic  panel  . Hemoglobin A1c  . Lipid panel  . Cardiovascular Risk Assessment  . Ambulatory referral to Dermatology  . Ambulatory referral to Psychiatry  . POCT UA - Microalbumin     Follow-up: Return in about 2 weeks (around 05/23/2020) for Please see if she will schedule appointment virtually. . For bp, sugars,  and swelling.   An After Visit Summary was printed and given to the patient.  [name] [facility] [phone]

## 2020-05-09 NOTE — Patient Instructions (Addendum)
Stop amlodipine.  Stop Lasix (furosemide) Referral to dermatology.  Referral to psychiatrist.  Follow up in 2 weeks on virtual visit via phone or video.

## 2020-05-10 LAB — LIPID PANEL
Chol/HDL Ratio: 4.1 ratio (ref 0.0–4.4)
Cholesterol, Total: 167 mg/dL (ref 100–199)
HDL: 41 mg/dL (ref >39)
LDL Chol Calc (NIH): 82 mg/dL (ref 0–99)
Triglycerides: 268 mg/dL — ABNORMAL HIGH (ref 0–149)
VLDL Cholesterol Cal: 44 mg/dL — ABNORMAL HIGH (ref 5–40)

## 2020-05-10 LAB — COMPREHENSIVE METABOLIC PANEL
ALT: 21 IU/L (ref 0–32)
AST: 21 IU/L (ref 0–40)
Albumin/Globulin Ratio: 1.7 (ref 1.2–2.2)
Albumin: 4.4 g/dL (ref 3.8–4.8)
Alkaline Phosphatase: 60 IU/L (ref 48–121)
BUN/Creatinine Ratio: 35 — ABNORMAL HIGH (ref 12–28)
BUN: 33 mg/dL — ABNORMAL HIGH (ref 8–27)
Bilirubin Total: 0.3 mg/dL (ref 0.0–1.2)
CO2: 28 mmol/L (ref 20–29)
Calcium: 9.9 mg/dL (ref 8.7–10.3)
Chloride: 96 mmol/L (ref 96–106)
Creatinine, Ser: 0.95 mg/dL (ref 0.57–1.00)
GFR calc Af Amer: 70 mL/min/{1.73_m2} (ref >59)
GFR calc non Af Amer: 61 mL/min/{1.73_m2} (ref >59)
Globulin, Total: 2.6 g/dL (ref 1.5–4.5)
Glucose: 155 mg/dL — ABNORMAL HIGH (ref 65–99)
Potassium: 3.7 mmol/L (ref 3.5–5.2)
Sodium: 139 mmol/L (ref 134–144)
Total Protein: 7 g/dL (ref 6.0–8.5)

## 2020-05-10 LAB — CBC WITH DIFFERENTIAL/PLATELET
Basophils Absolute: 0.1 10*3/uL (ref 0.0–0.2)
Basos: 1 %
EOS (ABSOLUTE): 0.1 10*3/uL (ref 0.0–0.4)
Eos: 1 %
Hematocrit: 46.7 % — ABNORMAL HIGH (ref 34.0–46.6)
Hemoglobin: 15.5 g/dL (ref 11.1–15.9)
Immature Grans (Abs): 0.2 10*3/uL — ABNORMAL HIGH (ref 0.0–0.1)
Immature Granulocytes: 2 %
Lymphocytes Absolute: 3.1 10*3/uL (ref 0.7–3.1)
Lymphs: 25 %
MCH: 29.2 pg (ref 26.6–33.0)
MCHC: 33.2 g/dL (ref 31.5–35.7)
MCV: 88 fL (ref 79–97)
Monocytes Absolute: 0.8 10*3/uL (ref 0.1–0.9)
Monocytes: 7 %
Neutrophils Absolute: 8 10*3/uL — ABNORMAL HIGH (ref 1.4–7.0)
Neutrophils: 64 %
Platelets: 322 10*3/uL (ref 150–450)
RBC: 5.3 x10E6/uL — ABNORMAL HIGH (ref 3.77–5.28)
RDW: 12.8 % (ref 11.7–15.4)
WBC: 12.3 10*3/uL — ABNORMAL HIGH (ref 3.4–10.8)

## 2020-05-10 LAB — HEMOGLOBIN A1C
Est. average glucose Bld gHb Est-mCnc: 166 mg/dL
Hgb A1c MFr Bld: 7.4 % — ABNORMAL HIGH (ref 4.8–5.6)

## 2020-05-10 LAB — CARDIOVASCULAR RISK ASSESSMENT

## 2020-05-11 ENCOUNTER — Telehealth: Payer: Self-pay

## 2020-05-11 ENCOUNTER — Other Ambulatory Visit: Payer: Self-pay | Admitting: Family Medicine

## 2020-05-11 MED ORDER — TRULICITY 3 MG/0.5ML ~~LOC~~ SOAJ
3.0000 mg | SUBCUTANEOUS | 2 refills | Status: DC
Start: 2020-05-11 — End: 2020-07-16

## 2020-05-11 NOTE — Progress Notes (Signed)
Unsuccessful outreach to patient. Left message for patient to return call.  [name] Uselman, [place] Pharmacist Assistant  [phone]

## 2020-05-12 ENCOUNTER — Ambulatory Visit: Payer: Medicare Other

## 2020-05-15 ENCOUNTER — Encounter: Payer: Self-pay | Admitting: Family Medicine

## 2020-05-16 ENCOUNTER — Telehealth: Payer: Self-pay

## 2020-05-16 NOTE — Progress Notes (Addendum)
Chronic Care Management Pharmacy Assistant   Name: Sonya Small  MRN: [id number] DOB: [birth year]  Reason for Encounter: Medication Review  Patient Questions:  1.  Have you seen any other providers since your last visit? Yes, see notes below  2.  Any changes in your medicines or health? Yes, see notes below.   05/09/20- OV to PCP ([name]) for HTN and DM- increased Trulicity to 3mg  once weekly; Stop taking Amlodipine and Furosemide due to low blood pressures. Referral to derm for atypical, pigmented skin lesion; Referral to Psychiatry for Severe recurrent major depression without psychotic features.   PCP : [name], MD  Allergies:  No Known Allergies  Medications: Outpatient Encounter Medications as of 05/16/2020  Medication Sig Note  . albuterol (VENTOLIN HFA) 108 (90 Base) MCG/ACT inhaler Inhale 2 puffs into the lungs every 4 (four) hours as needed for wheezing or shortness of breath.   . ALPRAZolam (XANAX) 0.5 MG tablet Take 1 tablet (0.5 mg total) by mouth 3 (three) times daily.   . Aspirin-Acetaminophen (GOODYS BODY PAIN PO) Take 1 packet by mouth daily.   . clopidogrel (PLAVIX) 75 MG tablet TAKE ONE (1) TABLET BY MOUTH ONCE DAILY   . diclofenac Sodium (VOLTAREN) 1 % GEL    . Dulaglutide (TRULICITY) 3 OZ/3.6UY SOPN Inject 0.5 mLs (3 mg total) as directed once a week.   . fenofibrate 160 MG tablet TAKE ONE (1) TABLET ONCE DAILY   . fluticasone (FLONASE) 50 MCG/ACT nasal spray Place 1 spray into both nostrils daily.   . Fluticasone-Salmeterol,sensor, (AIRDUO DIGIHALER) 113-14 MCG/ACT AEPB Inhale 1 puff into the lungs in the morning and at bedtime.   . furosemide (LASIX) 40 MG tablet Take 1 tablet (40 mg total) by mouth 2 (two) times daily.   [name] glucose blood (ONETOUCH VERIO) test strip USE DAILY TO CHECK YOUR BLOOD SUGAR (E11.69)   . icosapent Ethyl (VASCEPA) 1 g capsule TAKE 2 CAPSULES TWICE A DAY WITH FOOD   . lamoTRIgine (LAMICTAL) 25 MG tablet Take 2 tablets (50 mg total) by  mouth daily.   . lansoprazole (PREVACID) 30 MG capsule Take 1 capsule (30 mg total) by mouth daily.   . metoprolol tartrate (LOPRESSOR) 50 MG tablet Take 50 mg by mouth daily.  02/02/2019: LF 12/24/18  . nitroGLYCERIN (NITROSTAT) 0.4 MG SL tablet Place 1 tablet (0.4 mg total) under the tongue every 5 (five) minutes as needed.   [name] VERIO test strip USE DAILY TO CHECK YOUR BLOOD SUGAR (E11.69)   . oxybutynin (DITROPAN) 5 MG tablet One three times a day   . oxyCODONE-acetaminophen (PERCOCET) 10-325 MG tablet Take 1 tablet by mouth every 6 (six) hours as needed.   . rosuvastatin (CRESTOR) 20 MG tablet TAKE ONE (1) TABLET BY MOUTH ONCE DAILY   . tiZANidine (ZANAFLEX) 4 MG tablet Take 1 tablet (4 mg total) by mouth in the morning and at bedtime. (Patient taking differently: Take 2 mg by mouth at bedtime. )   . TRUEplus Lancets 30G MISC 1 each by Does not apply route 2 (two) times daily.   . valACYclovir (VALTREX) 1000 MG tablet 2 po bid for one day   . venlafaxine XR (EFFEXOR-XR) 75 MG 24 hr capsule Take 1 capsule (75 mg total) by mouth every morning.   . Vitamin D, Cholecalciferol, 10 MCG (400 UNIT) CAPS Take 400 Units by mouth daily.     No facility-administered encounter medications on file as of 05/16/2020.    Current  Diagnosis: Patient Active Problem List   Diagnosis Date Noted  . Severe recurrent major depression without psychotic features ([place]) 05/09/2020  . Atypical pigmented skin lesion 05/09/2020  . Dizziness 02/07/2020  . Dermatitis 01/12/2020  . Mixed stress and urge urinary incontinence 12/25/2019  . Localized edema 12/18/2019  . Dry mouth 12/18/2019  . Diabetic polyneuropathy associated with type 2 diabetes mellitus ([place]) 12/16/2019  . Weakness 12/07/2019  . Ingrown toenail of both feet 12/07/2019  . Moderate recurrent major depression ([place]) 12/03/2019  . Cigarette nicotine dependence with nicotine-induced disorder 12/03/2019  . Type 2 diabetes mellitus with  hypertriglyceridemia ([place]) 12/03/2019  . Hypertensive heart disease   . Oral thrush 10/28/2015  . Pituitary adenoma ([place]) 02/22/2015  . Cerebral infarction due to thrombosis of left middle cerebral artery ([place]) 08/23/2014  . HLD (hyperlipidemia) 08/23/2014  . Essential hypertension 08/23/2014  . PVD (peripheral vascular disease) ([place]) 08/23/2014  . Former smoker 08/23/2014  . COPD (chronic obstructive pulmonary disease) ([place]) 07/13/2014  . OSA (obstructive sleep apnea) 07/13/2014  . Chronic diastolic heart failure ([place]) 07/13/2014  . Stroke ([place]) 07/12/2014  . HTN (hypertension) 07/12/2014  . Peripheral vascular disease, unspecified ([place]) 02/17/2013  . Aftercare following surgery of the circulatory system, [place] 02/17/2013  . Chest pain 10/16/2012  . Coronary artery disease   . PAD (peripheral artery disease) ([place])   . Hyperlipidemia   . Tobacco abuse   . History of myocardial infarction 01/22/2010    Goals Addressed   None     Follow-Up:  Coordination of Enhanced Pharmacy Services  Reviewed chart for medication changes ahead of medication coordination call.  05/09/20- OV to PCP ([name]) for HTN and DM- increased Trulicity to 3mg  once weekly; Stop taking Amlodipine and Furosemide due to low blood pressures. Referral to derm for atypical, pigmented skin lesion; Referral to Psychiatry for Severe recurrent major depression without psychotic features.   BP Readings from Last 3 Encounters:  05/09/20 120/70  02/01/20 136/72  01/22/20 (!) 94/50    Lab Results  Component Value Date   HGBA1C 7.4 (H) 05/09/2020     Patient obtains medications through Adherence Packaging  30 Days   Last adherence delivery included:  . Lisinopril-hctz 10-12.5 mg daily (Breakfast) . Clopidogrel 75 mg daily (breakfast) . Fenofibrate 160 mg daily (breakfast) . Furosemide 40 mg bid (Breakfast & bedtime) . Lamotrigine 25mg   2 tabs once daily (2 at bedtime)  . Lansoprazole 30 mg daily  (breakfast) . Rosuvastatin 20 mg daily (bedtime) . Tizanidine 2 mg ( tab) (bedtime) - directions changed from pain management . Vascepa 1 gm - 2 caps bid (Breakfast & bedtime) . Venlafaxine XR 75 mg daily (Breakfast) . Trulicity inj 9.5/2.8 (once a week on Wednesday)  . Valacyclovir to be filled in bottles   Patient declined the following medications last month: Ventolin HFA- PRN- will call when running low Flonase- PRN- will call when running low Nitroglycerin - PRN - will call when running low Test Strips- has plenty on hand- will call when running low  Patient is due for next adherence delivery on: 05/24/2020. Called patient and reviewed medications and coordinated delivery.  This delivery to include: . Lisinopril-hctz 10-12.5 mg daily (Breakfast) . Clopidogrel 75 mg daily (breakfast) . Fenofibrate 160 mg daily (breakfast) . Lamotrigine 25mg   2 tabs once daily (2 at bedtime)  . Lansoprazole 30 mg daily (breakfast) . Rosuvastatin 20 mg daily (bedtime) . Tizanidine 2 mg ( tab) (bedtime) -  . Vascepa 1 gm - 2  caps bid (Breakfast & bedtime) . Venlafaxine XR 75 mg daily (Breakfast) . Trulicity inj 3 IR/6.7 (once a week on Wednesday)  . Valacyclovir to be filled in bottles   Patient declined the following medications: Ventolin HFA- PRN, not needed at this time Flonase- PRN, not needed at this time Nitroglycerin - PRN, has plenty Test Strips- not needed at this time, has enough supply on hand  Patient does not need refills at this time.  Confirmed delivery date of 05/24/2020, advised patient that pharmacy will contact them the morning of delivery.  [name] Uselman, [place] Pharmacist Assistant  [phone]

## 2020-05-19 ENCOUNTER — Ambulatory Visit (INDEPENDENT_AMBULATORY_CARE_PROVIDER_SITE_OTHER): Payer: Medicare Other

## 2020-05-19 ENCOUNTER — Other Ambulatory Visit: Payer: Self-pay

## 2020-05-19 DIAGNOSIS — Z20828 Contact with and (suspected) exposure to other viral communicable diseases: Secondary | ICD-10-CM

## 2020-05-19 NOTE — Progress Notes (Signed)
Patient Name: DEMITRIA HAY Date of Birth: 1950-12-04 MRN:  [id number]  CHOLE DRIVER is a 69 y.o. yo female presenting for COVID-19 testing.  She is being tested from the vehicle.  FOREST PRUDEN is being tested due to a positive COVID exposure.  Patient understands that she will be notified of result as soon as we receive them, usually within 24-48 hours.  Verbal information given to patient according to CDC Guidelines.  Erie Noe, LPN 1:65 AM

## 2020-05-21 LAB — NOVEL CORONAVIRUS, NAA: SARS-CoV-2, NAA: NOT DETECTED

## 2020-05-21 LAB — SARS-COV-2, NAA 2 DAY TAT

## 2020-05-24 NOTE — Telephone Encounter (Signed)
error 

## 2020-05-25 ENCOUNTER — Telehealth (INDEPENDENT_AMBULATORY_CARE_PROVIDER_SITE_OTHER): Payer: Medicare Other | Admitting: Family Medicine

## 2020-05-25 VITALS — BP 140/42 | HR 88 | Temp 97.3°F | Resp 16 | Wt 167.6 lb

## 2020-05-25 DIAGNOSIS — E1169 Type 2 diabetes mellitus with other specified complication: Secondary | ICD-10-CM | POA: Diagnosis not present

## 2020-05-25 DIAGNOSIS — I5022 Chronic systolic (congestive) heart failure: Secondary | ICD-10-CM | POA: Diagnosis not present

## 2020-05-25 DIAGNOSIS — I110 Hypertensive heart disease with heart failure: Secondary | ICD-10-CM

## 2020-05-25 DIAGNOSIS — E781 Pure hyperglyceridemia: Secondary | ICD-10-CM | POA: Diagnosis not present

## 2020-05-25 NOTE — Progress Notes (Signed)
Virtual Visit via Video Note   This visit type was conducted due to national recommendations for restrictions regarding the COVID-19 Pandemic (e.g. social distancing) in an effort to limit this patient's exposure and mitigate transmission in our community.  Due to her co-morbid illnesses, this patient is at least at moderate risk for complications without adequate follow up.  This format is felt to be most appropriate for this patient at this time.  All issues noted in this document were discussed and addressed.  A limited physical exam was performed with this format.  A verbal consent was obtained for the virtual visit.   Date:  06/05/2020   ID:  Sonya Small, DOB [birth year], MRN [id number]  Patient Location: Home Provider Location: Office/Clinic  PCP:  [name], MD   Evaluation Performed:  Follow-Up Visit  Chief Complaint:  Diabetes follow up.  History of Present Illness:    Sonya Small is a 69 y.o. female with follow up diabetes and hypertesnion.   Diabetes increased trulicity 3 mg once weekly. Sugars 150s.  Hypertension - stopped amlodipine and stopped lasix. bp was too low at home. Mild increase in swelling in feet more in the foot that she had the toenail removed. Bps 130-140s/90s.  The patient does not have symptoms concerning for COVID-19 infection (fever, chills, cough, or new shortness of breath).    Past Medical History:  Diagnosis Date  . Anxiety   . CAD (coronary artery disease)    a. NSTEMI 11/2008 s/p DES to LCx (3.0x12 Xience); b. NSTEMI 01/2010 secondary to thrombotic RCA lesion (non-obstructive)-->med rx (integrilin x 24 hrs + plavix); c. 09/2012 negative Myoview.  . Chronic diastolic CHF (congestive heart failure) ([place])    a. 06/2014 Echo: EF 55-60%, no rwma, Gr1 DD, mild AI.  [name] Depression   . Dizziness   . Drug induced constipation   . Generalized hyperhidrosis   . GERD (gastroesophageal reflux disease)   . Headache   . Hyperlipidemia   .  Hypertensive heart disease   . Lumbar disc disease   . Metabolic encephalopathy   . Mixed hyperlipidemia   . Myocardial infarction ([place])   . Obstructive sleep apnea   . OP (osteoporosis)   . Osteoarthritis   . Osteoporosis   . Other malaise   . Overweight(278.02)   . PAD (peripheral artery disease) ([place])    a. Emboli to R foot 2010 from partially occlusive lesion in R EIA, s/p stenting. - followed by Dr. Donnetta Hutching;  b. 10/2015 ABIs: R 1.03, L 0.97.  [name] Restless leg   . Sleep apnea   . Stroke ([place])   . TIA (transient ischemic attack)   . Tobacco abuse   . Urge incontinence     Past Surgical History:  Procedure Laterality Date  . EYE SURGERY     at age 89  . hysterectomy -age 68    . ILIAC ARTERY STENT     RIGHT ILIAC STENT  . KNEE ARTHROSCOPY    . LUMBAR LAMINECTOMY    . TUBAL LIGATION      Family History  Problem Relation Age of Onset  . Alzheimer's disease Mother   . Cancer Brother   . Heart disease Other        Grandfather  . Hepatitis C Brother   . Diabetes Brother   . Thyroid disease Brother   . Hypertension Brother   . Depression Brother     Social History   Socioeconomic History  . Marital  status: Divorced    Spouse name: Not on file  . Number of children: 3  . Years of education: 10 th  . Highest education level: Not on file  Occupational History  . Occupation: DISABLED    Employer: UNEMPLOYED  Tobacco Use  . Smoking status: Current Every Day Smoker    Packs/day: 0.50    Types: Cigarettes    Last attempt to quit: 11/20/2012    Years since quitting: 7.5  . Smokeless tobacco: Never Used  Substance and Sexual Activity  . Alcohol use: No    Alcohol/week: 0.0 standard drinks  . Drug use: No  . Sexual activity: Never  Other Topics Concern  . Not on file  Social History Narrative   Patient is single with 3 children, 1 deceased.   Patient is right handed.   Patient has 10 th grade education.   Patient drinks 5 or more cups daily.   Social  Determinants of Health   Financial Resource Strain:   . Difficulty of Paying Living Expenses: Not on file  Food Insecurity:   . Worried About Charity fundraiser in the Last Year: Not on file  . Ran Out of Food in the Last Year: Not on file  Transportation Needs: No Transportation Needs  . Lack of Transportation (Medical): No  . Lack of Transportation (Non-Medical): No  Physical Activity:   . Days of Exercise per Week: Not on file  . Minutes of Exercise per Session: Not on file  Stress: Stress Concern Present  . Feeling of Stress : Rather much  Social Connections:   . Frequency of Communication with Friends and Family: Not on file  . Frequency of Social Gatherings with Friends and Family: Not on file  . Attends Religious Services: Not on file  . Active Member of Clubs or Organizations: Not on file  . Attends Archivist Meetings: Not on file  . Marital Status: Not on file  Intimate Partner Violence:   . Fear of Current or Ex-Partner: Not on file  . Emotionally Abused: Not on file  . Physically Abused: Not on file  . Sexually Abused: Not on file    Outpatient Medications Prior to Visit  Medication Sig Dispense Refill  . albuterol (VENTOLIN HFA) 108 (90 Base) MCG/ACT inhaler Inhale 2 puffs into the lungs every 4 (four) hours as needed for wheezing or shortness of breath. 18 g 2  . ALPRAZolam (XANAX) 0.5 MG tablet Take 1 tablet (0.5 mg total) by mouth 3 (three) times daily. 90 tablet 0  . Aspirin-Acetaminophen (GOODYS BODY PAIN PO) Take 1 packet by mouth daily.    . clopidogrel (PLAVIX) 75 MG tablet TAKE ONE (1) TABLET BY MOUTH ONCE DAILY 90 tablet 1  . diclofenac Sodium (VOLTAREN) 1 % GEL     . Dulaglutide (TRULICITY) 3 MW/1.0UV SOPN Inject 0.5 mLs (3 mg total) as directed once a week. 2 mL 2  . fenofibrate 160 MG tablet TAKE ONE (1) TABLET ONCE DAILY 90 tablet 1  . fluticasone (FLONASE) 50 MCG/ACT nasal spray Place 1 spray into both nostrils daily. 16 g 1  .  Fluticasone-Salmeterol,sensor, (AIRDUO DIGIHALER) 113-14 MCG/ACT AEPB Inhale 1 puff into the lungs in the morning and at bedtime.    . furosemide (LASIX) 40 MG tablet Take 1 tablet (40 mg total) by mouth 2 (two) times daily. 180 tablet 1  . glucose blood (ONETOUCH VERIO) test strip USE DAILY TO CHECK YOUR BLOOD SUGAR (E11.69)    .  icosapent Ethyl (VASCEPA) 1 g capsule TAKE 2 CAPSULES TWICE A DAY WITH FOOD 360 capsule 1  . lamoTRIgine (LAMICTAL) 25 MG tablet Take 2 tablets (50 mg total) by mouth daily. 180 tablet 1  . lansoprazole (PREVACID) 30 MG capsule Take 1 capsule (30 mg total) by mouth daily. 90 capsule 1  . metoprolol tartrate (LOPRESSOR) 50 MG tablet Take 50 mg by mouth daily.     . nitroGLYCERIN (NITROSTAT) 0.4 MG SL tablet Place 1 tablet (0.4 mg total) under the tongue every 5 (five) minutes as needed. 25 tablet 1  . ONETOUCH VERIO test strip USE DAILY TO CHECK YOUR BLOOD SUGAR (E11.69) 100 each 3  . oxybutynin (DITROPAN) 5 MG tablet One three times a day 270 tablet 1  . oxyCODONE-acetaminophen (PERCOCET) 10-325 MG tablet Take 1 tablet by mouth every 6 (six) hours as needed.    . rosuvastatin (CRESTOR) 20 MG tablet TAKE ONE (1) TABLET BY MOUTH ONCE DAILY 90 tablet 1  . tiZANidine (ZANAFLEX) 4 MG tablet Take 1 tablet (4 mg total) by mouth in the morning and at bedtime. (Patient taking differently: Take 2 mg by mouth at bedtime. ) 180 tablet 2  . TRUEplus Lancets 30G MISC 1 each by Does not apply route 2 (two) times daily. 100 each 2  . valACYclovir (VALTREX) 1000 MG tablet 2 po bid for one day 10 tablet 2  . venlafaxine XR (EFFEXOR-XR) 75 MG 24 hr capsule Take 1 capsule (75 mg total) by mouth every morning. 90 capsule 1  . Vitamin D, Cholecalciferol, 10 MCG (400 UNIT) CAPS Take 400 Units by mouth daily.      No facility-administered medications prior to visit.    Allergies:   Patient has no known allergies.   Social History   Tobacco Use  . Smoking status: Current Every Day Smoker      Packs/day: 0.50    Types: Cigarettes    Last attempt to quit: 11/20/2012    Years since quitting: 7.5  . Smokeless tobacco: Never Used  Substance Use Topics  . Alcohol use: No    Alcohol/week: 0.0 standard drinks  . Drug use: No     Review of Systems  Constitutional: Positive for diaphoresis (at night). Negative for chills, fever and malaise/fatigue.  HENT: Negative for congestion, ear pain, sinus pain and sore throat.   Eyes: Positive for redness (left eye. using systane which helps. no pain. ). Negative for blurred vision.  Respiratory: Negative for cough and shortness of breath.   Cardiovascular: Negative for chest pain.  Musculoskeletal: Negative for myalgias.  Neurological: Negative for headaches.  Psychiatric/Behavioral: Positive for depression.     Labs/Other Tests and Data Reviewed:    Recent Labs: 12/04/2019: TSH 0.619 05/09/2020: ALT 21; BUN 33; Creatinine, Ser 0.95; Hemoglobin 15.5; Platelets 322; Potassium 3.7; Sodium 139   Recent Lipid Panel Lab Results  Component Value Date/Time   CHOL 167 05/09/2020 11:16 AM   TRIG 268 (H) 05/09/2020 11:16 AM   HDL 41 05/09/2020 11:16 AM   CHOLHDL 4.1 05/09/2020 11:16 AM   CHOLHDL 5.0 07/13/2014 01:08 AM   LDLCALC 82 05/09/2020 11:16 AM    Wt Readings from Last 3 Encounters:  05/26/20 167 lb 9.6 oz (76 kg)  05/09/20 161 lb 9.6 oz (73.3 kg)  02/01/20 164 lb (74.4 kg)     Objective:    Vital Signs:  BP (!) 140/42   Pulse 88   Temp (!) 97.3 F (36.3 C)   Resp 16  Wt 167 lb 9.6 oz (76 kg)   BMI 27.89 kg/m    Physical Exam Constitutional:      Appearance: Normal appearance.  Eyes:     Comments: Left subconjunctival hemorrhage resolving. I had patient stop by on 05/26/2020.  Neurological:     Mental Status: She is alert.      ASSESSMENT & PLAN:   1. Type 2 diabetes mellitus with hypertriglyceridemia (HCC) Continue trulicity at current dose of 3 mg once weekly  2. Hypertensive heart disease with chronic  systolic congestive heart failure (HCC) Restart lasix 40 mg 1/2 daily.  3. Left subconjunctival hemorrhage.  Reassurance given.   4. Depression, major, recurrent, moderate-severe The current medical regimen until sees psychiatry;  continue present plan and medications. Contracts for safety.  5. Abnormal skin lesions - already referred to dermatology.  COVID-19 Education: The signs and symptoms of COVID-19 were discussed with the patient and how to seek care for testing (follow up with PCP or arrange E-visit). The importance of social distancing was discussed today.  Time:   Today, I have spent 15 minutes with the patient with telehealth technology discussing the above problems.    Follow Up:  In Person in 3 month(s)  Signed, [name], MD  06/05/2020 7:13 PM    [place]

## 2020-05-26 ENCOUNTER — Ambulatory Visit: Payer: Medicare Other | Admitting: Family Medicine

## 2020-05-26 ENCOUNTER — Other Ambulatory Visit: Payer: Self-pay

## 2020-06-05 ENCOUNTER — Encounter: Payer: Self-pay | Admitting: Family Medicine

## 2020-06-14 ENCOUNTER — Other Ambulatory Visit: Payer: Self-pay

## 2020-06-14 MED ORDER — ALPRAZOLAM 0.5 MG PO TABS
0.5000 mg | ORAL_TABLET | Freq: Three times a day (TID) | ORAL | 1 refills | Status: DC
Start: 2020-06-14 — End: 2020-08-17

## 2020-06-17 NOTE — Progress Notes (Signed)
Cardiology Office Note    Date:  06/22/2020   ID:  Mykeria, Garman 08/07/1951, MRN [id number]  PCP:  [name], MD  Cardiologist:  Dr. [name]  Chief Complaint  Patient presents with  . Coronary Artery Disease    History of Present Illness:  XITLALLY MOONEYHAM is a 69 y.o. female with PMH of HTN, HLD, OSA, PAD, CVA, chronic diastolic heart failureand CAD. Patient had a NSTEMI in March 2010 and underwent DES to left circumflex.She had anotherNSTEMI in May 2011 secondary to thrombotic RCA lesion, cardiac catheterization showed nonobstructive disease, medical therapy was recommended. She was admitted for recurrent chest pain in January 2014, Myoview was negative. She was admitted for acute CVA in October 2015 after presenting with slurred speech and facial drooping. MRI showed acute right MCA infarct involving basal ganglia and periventricular white matter. She was started on aspirin along with Plavix. Myoview obtained in August 2017 showed no ischemia, normal EF. She was last seen by Dr. [name] in June 2018, she was very sedentary at the time and notes dyspnea on exertion with any activity. ABI obtained in July 2018 was normal.   She was seen by Almyra Deforest PA-C on 03/13/2018  with chest pain.  A Myoview study on 03/25/2018 which showed EF 52%, small sized moderate intensity fixed apical perfusion defect likely attenuation artifact was seen, no reversible ischemia otherwise.  Overall this is a low risk stress test.  Seen again on 05/22/2018, he was having a lot of dizziness. She was noted to be taking different doses of medication than what was prescribed. BP was stable. Polypharmacy with use of Xanax, oxycodone, Tizanidiine, Zoloft. Dizziness felt to be more related to medication than to a vascular issue. Carotid dopplers were checked and were OK.  She was admitted in May 2020 with PNA and respiratory failure requiring ventilator support. Treated with antibiotics and breathing treatments with  improvement. Still smoking.  On follow up today she is doing fine. She still complains of neuropathy in her feet.  On Lyrica. Prior LE dopplers showed no significant PAD. She denies any chest pain or SOB.     Past Medical History:  Diagnosis Date  . Anxiety   . CAD (coronary artery disease)    a. NSTEMI 11/2008 s/p DES to LCx (3.0x12 Xience); b. NSTEMI 01/2010 secondary to thrombotic RCA lesion (non-obstructive)-->med rx (integrilin x 24 hrs + plavix); c. 09/2012 negative Myoview.  . Chronic diastolic CHF (congestive heart failure) ([place])    a. 06/2014 Echo: EF 55-60%, no rwma, Gr1 DD, mild AI.  [name] Depression   . Dizziness   . Drug induced constipation   . Generalized hyperhidrosis   . GERD (gastroesophageal reflux disease)   . Headache   . Hyperlipidemia   . Hypertensive heart disease   . Lumbar disc disease   . Metabolic encephalopathy   . Mixed hyperlipidemia   . Myocardial infarction ([place])   . Obstructive sleep apnea   . OP (osteoporosis)   . Osteoarthritis   . Osteoporosis   . Other malaise   . Overweight(278.02)   . PAD (peripheral artery disease) ([place])    a. Emboli to R foot 2010 from partially occlusive lesion in R EIA, s/p stenting. - followed by Dr. Donnetta Hutching;  b. 10/2015 ABIs: R 1.03, L 0.97.  [name] Restless leg   . Sleep apnea   . Stroke ([place])   . TIA (transient ischemic attack)   . Tobacco abuse   . Urge incontinence  Past Surgical History:  Procedure Laterality Date  . EYE SURGERY     at age 69  . hysterectomy -age 54    . ILIAC ARTERY STENT     RIGHT ILIAC STENT  . KNEE ARTHROSCOPY    . LUMBAR LAMINECTOMY    . TUBAL LIGATION      Current Medications: Outpatient Medications Prior to Visit  Medication Sig Dispense Refill  . albuterol (VENTOLIN HFA) 108 (90 Base) MCG/ACT inhaler Inhale 2 puffs into the lungs every 4 (four) hours as needed for wheezing or shortness of breath. 18 g 2  . ALPRAZolam (XANAX) 0.5 MG tablet Take 1 tablet (0.5 mg total) by mouth 3  (three) times daily. 90 tablet 1  . Aspirin-Acetaminophen (GOODYS BODY PAIN PO) Take 1 packet by mouth daily.    . clopidogrel (PLAVIX) 75 MG tablet TAKE ONE (1) TABLET BY MOUTH ONCE DAILY 90 tablet 1  . diclofenac Sodium (VOLTAREN) 1 % GEL     . Dulaglutide (TRULICITY) 3 ZT/2.4PY SOPN Inject 0.5 mLs (3 mg total) as directed once a week. 2 mL 2  . fenofibrate 160 MG tablet TAKE ONE (1) TABLET ONCE DAILY 90 tablet 1  . fluticasone (FLONASE) 50 MCG/ACT nasal spray Place 1 spray into both nostrils daily. 16 g 1  . Fluticasone-Salmeterol,sensor, (AIRDUO DIGIHALER) 113-14 MCG/ACT AEPB Inhale 1 puff into the lungs in the morning and at bedtime.    [name] glucose blood (ONETOUCH VERIO) test strip USE DAILY TO CHECK YOUR BLOOD SUGAR (E11.69)    . icosapent Ethyl (VASCEPA) 1 g capsule TAKE 2 CAPSULES TWICE A DAY WITH FOOD 360 capsule 1  . lamoTRIgine (LAMICTAL) 25 MG tablet Take 2 tablets (50 mg total) by mouth daily. 180 tablet 1  . lansoprazole (PREVACID) 30 MG capsule Take 1 capsule (30 mg total) by mouth daily. 90 capsule 1  . lisinopril-hydrochlorothiazide (ZESTORETIC) 10-12.5 MG tablet Take 1 tablet by mouth daily.    . nitroGLYCERIN (NITROSTAT) 0.4 MG SL tablet Place 1 tablet (0.4 mg total) under the tongue every 5 (five) minutes as needed. 25 tablet 1  . ONETOUCH VERIO test strip USE DAILY TO CHECK YOUR BLOOD SUGAR (E11.69) 100 each 3  . oxyCODONE-acetaminophen (PERCOCET) 10-325 MG tablet Take 1 tablet by mouth every 6 (six) hours as needed.    . rosuvastatin (CRESTOR) 20 MG tablet TAKE ONE (1) TABLET BY MOUTH ONCE DAILY 90 tablet 1  . TRUEplus Lancets 30G MISC 1 each by Does not apply route 2 (two) times daily. 100 each 2  . valACYclovir (VALTREX) 1000 MG tablet 2 po bid for one day 10 tablet 2  . venlafaxine XR (EFFEXOR-XR) 75 MG 24 hr capsule Take 1 capsule (75 mg total) by mouth every morning. 90 capsule 1  . furosemide (LASIX) 40 MG tablet Take 1 tablet (40 mg total) by mouth 2 (two) times daily.  180 tablet 1  . metoprolol tartrate (LOPRESSOR) 50 MG tablet Take 50 mg by mouth daily.  (Patient not taking: Reported on 06/22/2020)    . oxybutynin (DITROPAN) 5 MG tablet One three times a day (Patient not taking: Reported on 06/22/2020) 270 tablet 1  . tiZANidine (ZANAFLEX) 4 MG tablet Take 1 tablet (4 mg total) by mouth in the morning and at bedtime. (Patient not taking: Reported on 06/22/2020) 180 tablet 2  . Vitamin D, Cholecalciferol, 10 MCG (400 UNIT) CAPS Take 400 Units by mouth daily.  (Patient not taking: Reported on 06/22/2020)     No facility-administered  medications prior to visit.     Allergies:   Patient has no known allergies.   Social History   Socioeconomic History  . Marital status: Divorced    Spouse name: Not on file  . Number of children: 3  . Years of education: 10 th  . Highest education level: Not on file  Occupational History  . Occupation: DISABLED    Employer: UNEMPLOYED  Tobacco Use  . Smoking status: Current Every Day Smoker    Packs/day: 0.50    Types: Cigarettes    Last attempt to quit: 11/20/2012    Years since quitting: 7.5  . Smokeless tobacco: Never Used  Substance and Sexual Activity  . Alcohol use: No    Alcohol/week: 0.0 standard drinks  . Drug use: No  . Sexual activity: Never  Other Topics Concern  . Not on file  Social History Narrative   Patient is single with 3 children, 1 deceased.   Patient is right handed.   Patient has 10 th grade education.   Patient drinks 5 or more cups daily.   Social Determinants of Health   Financial Resource Strain:   . Difficulty of Paying Living Expenses: Not on file  Food Insecurity:   . Worried About Charity fundraiser in the Last Year: Not on file  . Ran Out of Food in the Last Year: Not on file  Transportation Needs: No Transportation Needs  . Lack of Transportation (Medical): No  . Lack of Transportation (Non-Medical): No  Physical Activity:   . Days of Exercise per Week: Not on file  .  Minutes of Exercise per Session: Not on file  Stress: Stress Concern Present  . Feeling of Stress : Rather much  Social Connections:   . Frequency of Communication with Friends and Family: Not on file  . Frequency of Social Gatherings with Friends and Family: Not on file  . Attends Religious Services: Not on file  . Active Member of Clubs or Organizations: Not on file  . Attends Archivist Meetings: Not on file  . Marital Status: Not on file     Family History:  The patient's family history includes Alzheimer's disease in her mother; Cancer in her brother; Depression in her brother; Diabetes in her brother; Heart disease in an other family member; Hepatitis C in her brother; Hypertension in her brother; Thyroid disease in her brother.   ROS:   Please see the history of present illness.    ROS All other systems reviewed and are negative.   PHYSICAL EXAM:   VS:  BP 133/77   Pulse 74   Ht 5\' 5"  (1.651 m)   Wt 166 lb (75.3 kg)   SpO2 97%   BMI 27.62 kg/m    GEN: Well nourished, well developed, in no acute distress  HEENT: normal  Neck: no JVD, carotid bruits, or masses Cardiac: RRR; no murmurs, rubs, or gallops,no edema  Respiratory:  clear to auscultation bilaterally, normal work of breathing GI: soft, nontender, nondistended, + BS MS: no deformity or atrophy  Skin: warm and dry, no rash Neuro:  Alert and Oriented x 3, Strength and sensation are intact Psych: euthymic mood, full affect  Wt Readings from Last 3 Encounters:  06/22/20 166 lb (75.3 kg)  05/26/20 167 lb 9.6 oz (76 kg)  05/09/20 161 lb 9.6 oz (73.3 kg)      Studies/Labs Reviewed:   EKG:  EKG is not ordered today.    Recent Labs: 12/04/2019:  TSH 0.619 05/09/2020: ALT 21; BUN 33; Creatinine, Ser 0.95; Hemoglobin 15.5; Platelets 322; Potassium 3.7; Sodium 139   Lipid Panel    Component Value Date/Time   CHOL 167 05/09/2020 1116   TRIG 268 (H) 05/09/2020 1116   HDL 41 05/09/2020 1116   CHOLHDL  4.1 05/09/2020 1116   CHOLHDL 5.0 07/13/2014 0108   VLDL 42 (H) 07/13/2014 0108   LDLCALC 82 05/09/2020 1116   Labs dated 06/17/18: CBC normal except for elevated WBC 13.3.  Creatinine 1.14. Potassium 5.5.  Dated 05/23/18: cholesterol 142, triglycerides 212, HDL 35, LDL 65.  Dated 07/29/18: Normal chemistries and CBC.  Dated 09/02/19: cholesterol 165, triglycerides 146, HDL 49, LDL 62. A1c 7%. Creatinine 0.56. potassium 3.3. CBC and TSH normal.  Additional studies/ records that were reviewed today include:   Myoview 03/25/2018  The left ventricular ejection fraction is mildly decreased (45-54%).  Nuclear stress EF: 52%.  No T wave inversion was noted during stress.  There was no ST segment deviation noted during stress.  Defect 1: There is a small defect of moderate severity.  This is a low risk study.   Small size, moderate intensity fixed apical perfusion defect, likely attenuation artifact. No reversible ischemia. LVEF 52% with normal wall motion. This is a low risk study.    ASSESSMENT:    No diagnosis found.   PLAN:  In order of problems listed above:  1. CAD: really asymptomatic. Marland KitchenMyoview in August 2019 was low risk.  Continue aspirin and Plavix.   2. Hypertension: Blood pressure is well controlled.  3. Carotid artery disease: She has a history of mild carotid artery disease. Unchanged on recent doppler.  4. Chronic diastolic heart failure: Appears to be euvolemic on physical exam. Continue lasix 20 mg daily  5.   Dyslipidemia. On Crestor and Vascepa.  6.   Tobacco abuse. Recommend smoking cessation.       Medication Adjustments/Labs and Tests Ordered: Current medicines are reviewed at length with the patient today.  Concerns regarding medicines are outlined above.  Medication changes, Labs and Tests ordered today are listed in the Patient Instructions below. There are no Patient Instructions on file for this visit.   Signed, Caprice Wasko [name], MD  06/22/2020  3:02 PM    [facility], [place], [place]  [address] Phone: [phone]; Fax: [phone]

## 2020-06-20 ENCOUNTER — Ambulatory Visit: Payer: Self-pay

## 2020-06-20 NOTE — Chronic Care Management (AMB) (Signed)
Reviewed chart for medication changes ahead of medication coordination call.  Medication changes indicated OR if recent visit, treatment plan here. Resume Fursomide 40 mg 1/2 tablet each morning.   BP Readings from Last 3 Encounters:  05/26/20 (!) 140/42  05/09/20 120/70  02/01/20 136/72    Lab Results  Component Value Date   HGBA1C 7.4 (H) 05/09/2020     Patient obtains medications through Adherence Packaging  30 Days   Patient is due for next adherence delivery on: 06/24/2020 Called patient and reviewed medications and coordinated delivery.  This delivery to include: . Flonase nasal spray.  . Venlfaxine 75 mg - morning . Rosuvastatin 20 mg -bedtime . Clopidogrel 75 mg - morning . Lisinopril-hydrochlorothiazide - morning . Lansoprazole 30 mg - morning . Vascepa - 2 capsules morning and 2 capsules bedtime . Fenofribrate 160 mg daily morning . Lamotrigine 25 mg - 2 tablets bedtime . Valtrex - vial since PRN   Patient declined the following medications (meds) due to (reason): Airduo not needed- has 2 samples from office. Albuterol inhaler or nitroglycerin not needed due to prn. Trulicity not need for 3 more weeks - will fill out acute form when due. Not taking tizanidine currently. Furosemide -  tablet in the morning (plans to leave in vial due to possibility of changing but has plenty for now)  Patient needs refills for n/a  Confirmed delivery date of 06/24/2020, advised patient that pharmacy will contact them the morning of delivery.

## 2020-06-22 ENCOUNTER — Encounter: Payer: Self-pay | Admitting: Cardiology

## 2020-06-22 ENCOUNTER — Ambulatory Visit (INDEPENDENT_AMBULATORY_CARE_PROVIDER_SITE_OTHER): Payer: Medicare Other | Admitting: Cardiology

## 2020-06-22 ENCOUNTER — Other Ambulatory Visit: Payer: Self-pay

## 2020-06-22 VITALS — BP 133/77 | HR 74 | Ht 65.0 in | Wt 166.0 lb

## 2020-06-22 DIAGNOSIS — Z720 Tobacco use: Secondary | ICD-10-CM | POA: Diagnosis not present

## 2020-06-22 DIAGNOSIS — I10 Essential (primary) hypertension: Secondary | ICD-10-CM | POA: Diagnosis not present

## 2020-06-22 DIAGNOSIS — I25119 Atherosclerotic heart disease of native coronary artery with unspecified angina pectoris: Secondary | ICD-10-CM

## 2020-06-22 DIAGNOSIS — E785 Hyperlipidemia, unspecified: Secondary | ICD-10-CM | POA: Diagnosis not present

## 2020-06-22 DIAGNOSIS — I63312 Cerebral infarction due to thrombosis of left middle cerebral artery: Secondary | ICD-10-CM | POA: Diagnosis not present

## 2020-06-22 MED ORDER — FUROSEMIDE 20 MG PO TABS: 20.0000 mg | ORAL_TABLET | Freq: Every day | ORAL | 3 refills | Status: DC

## 2020-06-30 ENCOUNTER — Other Ambulatory Visit: Payer: Self-pay | Admitting: Family Medicine

## 2020-06-30 NOTE — Telephone Encounter (Signed)
Please call pt and confirm dose of lasix. Dr. [name] her heart doctor mentioned continuing her on Lasix 20 mg one daily, but I have her on a higher dose. Please let me know. Thanks, dr. [name]

## 2020-07-11 ENCOUNTER — Other Ambulatory Visit: Payer: Self-pay | Admitting: Family Medicine

## 2020-07-13 ENCOUNTER — Telehealth: Payer: Self-pay

## 2020-07-13 NOTE — Chronic Care Management (AMB) (Signed)
Patient called indicating that she is on her last day of prednisone for hip pain. She reports her fasting blood sugar today is 144 mg/dL. Today's reading is her highest reading in some time. Patient requesting refill of Trulicity delivered through [facility]. Pharmacist coordinated delivery for patient.   Sherre Poot, PharmD, [facility] Clinical Pharmacist [name] [phone] (office) [phone] (mobile)

## 2020-07-15 ENCOUNTER — Telehealth: Payer: Self-pay

## 2020-07-15 NOTE — Chronic Care Management (AMB) (Signed)
Chronic Care Management Pharmacy Assistant   Name: Sonya Small  MRN: [id number] DOB: [birth year]  Reason for Encounter: Medication Review/ Monthly Dispensing Call  Patient Questions:  1.  Have you seen any other providers since your last visit? Yes, 06/22/2020- Dr [name] (Cardiology)  2.  Any changes in your medicines or health? Yes, Furosemide changed from 40 mg to 20 mg 1 tablet daily on 06/22/20. Cholecalciferol 400 units, Metoprolol tartrate 50 mg, Oxybutynin 5 mg and Tizanidine 4 mg discontinued on 06/22/20 by Dr [name].   PCP : [name], MD  Allergies:  No Known Allergies  Medications: Outpatient Encounter Medications as of 07/15/2020  Medication Sig  . albuterol (VENTOLIN HFA) 108 (90 Base) MCG/ACT inhaler Inhale 2 puffs into the lungs every 4 (four) hours as needed for wheezing or shortness of breath.  . ALPRAZolam (XANAX) 0.5 MG tablet Take 1 tablet (0.5 mg total) by mouth 3 (three) times daily.  . Aspirin-Acetaminophen (GOODYS BODY PAIN PO) Take 1 packet by mouth daily.  . clopidogrel (PLAVIX) 75 MG tablet TAKE ONE (1) TABLET BY MOUTH ONCE DAILY  . diclofenac Sodium (VOLTAREN) 1 % GEL   . Dulaglutide (TRULICITY) 3 DQ/2.2WL SOPN Inject 0.5 mLs (3 mg total) as directed once a week.  . fenofibrate 160 MG tablet TAKE ONE (1) TABLET ONCE DAILY  . fluticasone (FLONASE) 50 MCG/ACT nasal spray Place 1 spray into both nostrils daily.  . Fluticasone-Salmeterol,sensor, (AIRDUO DIGIHALER) 113-14 MCG/ACT AEPB Inhale 1 puff into the lungs in the morning and at bedtime.  . furosemide (LASIX) 20 MG tablet Take 1 tablet (20 mg total) by mouth daily.  . furosemide (LASIX) 40 MG tablet TAKE 2 TABLETS BY MOUTH EVERY DAY  . glucose blood (ONETOUCH VERIO) test strip USE DAILY TO CHECK YOUR BLOOD SUGAR (E11.69)  . icosapent Ethyl (VASCEPA) 1 g capsule TAKE 2 CAPSULES TWICE A DAY WITH FOOD  . lamoTRIgine (LAMICTAL) 25 MG tablet TAKE TWO TABLETS BY MOUTH EVERYDAY AT BEDTIME  . lansoprazole  (PREVACID) 30 MG capsule TAKE ONE CAPSULE BY MOUTH EVERY MORNING  . lisinopril-hydrochlorothiazide (ZESTORETIC) 10-12.5 MG tablet Take 1 tablet by mouth daily.  . nitroGLYCERIN (NITROSTAT) 0.4 MG SL tablet Place 1 tablet (0.4 mg total) under the tongue every 5 (five) minutes as needed.  [name] VERIO test strip USE DAILY TO CHECK YOUR BLOOD SUGAR (E11.69)  . oxyCODONE-acetaminophen (PERCOCET) 10-325 MG tablet Take 1 tablet by mouth every 6 (six) hours as needed.  . rosuvastatin (CRESTOR) 20 MG tablet TAKE ONE (1) TABLET BY MOUTH ONCE DAILY  . TRUEplus Lancets 30G MISC 1 each by Does not apply route 2 (two) times daily.  . valACYclovir (VALTREX) 1000 MG tablet 2 po bid for one day  . venlafaxine XR (EFFEXOR-XR) 75 MG 24 hr capsule Take 1 capsule (75 mg total) by mouth every morning.   No facility-administered encounter medications on file as of 07/15/2020.    Current Diagnosis: Patient Active Problem List   Diagnosis Date Noted  . Severe recurrent major depression without psychotic features ([place]) 05/09/2020  . Atypical pigmented skin lesion 05/09/2020  . Dizziness 02/07/2020  . Dermatitis 01/12/2020  . Mixed stress and urge urinary incontinence 12/25/2019  . Localized edema 12/18/2019  . Dry mouth 12/18/2019  . Diabetic polyneuropathy associated with type 2 diabetes mellitus ([place]) 12/16/2019  . Weakness 12/07/2019  . Ingrown toenail of both feet 12/07/2019  . Moderate recurrent major depression ([place]) 12/03/2019  . Cigarette nicotine dependence with nicotine-induced disorder  12/03/2019  . Type 2 diabetes mellitus with hypertriglyceridemia ([place]) 12/03/2019  . Hypertensive heart disease   . Oral thrush 10/28/2015  . Pituitary adenoma ([place]) 02/22/2015  . Cerebral infarction due to thrombosis of left middle cerebral artery ([place]) 08/23/2014  . HLD (hyperlipidemia) 08/23/2014  . Essential hypertension 08/23/2014  . PVD (peripheral vascular disease) ([place]) 08/23/2014  . Former smoker  08/23/2014  . COPD (chronic obstructive pulmonary disease) ([place]) 07/13/2014  . OSA (obstructive sleep apnea) 07/13/2014  . Chronic diastolic heart failure ([place]) 07/13/2014  . Stroke ([place]) 07/12/2014  . HTN (hypertension) 07/12/2014  . Peripheral vascular disease, unspecified ([place]) 02/17/2013  . Aftercare following surgery of the circulatory system, [place] 02/17/2013  . Chest pain 10/16/2012  . Coronary artery disease   . PAD (peripheral artery disease) ([place])   . Hyperlipidemia   . Tobacco abuse   . History of myocardial infarction 01/22/2010   Reviewed chart for medication changes ahead of medication coordination call.  Consults-  06/22/2020- Dr [name] (Cardiology) since last care coordination call/Pharmacist visit.   Medication changes indicated: Furosemide changed from 40 mg to 20 mg 1 tablet daily on 06/22/20. Cholecalciferol 400 units, Metoprolol tartrate 50 mg, Oxybutynin 5 mg and Tizanidine 4 mg discontinued on 06/22/20 by Dr [name].    BP Readings from Last 3 Encounters:  06/22/20 133/77  05/26/20 (!) 140/42  05/09/20 120/70    Lab Results  Component Value Date   HGBA1C 7.4 (H) 05/09/2020     Patient obtains medications through [facility]  90 Days   Last adherence delivery included: Flonase NS-1 spray each nostril daily, Lisinopril-hctz 10-12.5 mg- 1 tablet daily (Breakfast), Clopidogrel 75 mg- 1 tablet daily (breakfast), Fenofibrate 160 mg- 1 tablet daily (breakfast),  Lamotrigine 25 mg  2 tabs once daily (2 at bedtime), Lansoprazole 30 mg daily (breakfast), Rosuvastatin 20 mg daily (bedtime),  Vascepa 1 gm - 2 caps bid (Breakfast & bedtime), Venlafaxine XR 75 mg daily (Breakfast),  Valacyclovir 1 gm- 2 tablet twice a day for one day as needed (vials).  Patient declined Furosemide 40 mg last month due being reduced to 20 mg by Cardiology she is taking 1/2 of 40 mg . Tizanidine 2 mg last month due to not taking,Trulicity inj 1.4/7.8 (once a week on Wednesday) due to  having 3 more weeks of medication left. Patient also declined Airduo - not needed- has 2 samples from office and Albuterol inhaler or nitroglycerin not needed due to prn.   Patient is due for next adherence delivery on: 07/21/2020. Called patient and reviewed medications and coordinated delivery.  This delivery to include: Flonase NS-1 spray each nostril daily, Lisinopril-hctz 10-12.5 mg- 1 tablet daily (Breakfast), Clopidogrel 75 mg- 1 tablet daily (breakfast), Fenofibrate 160 mg- 1 tablet daily (breakfast),  Lamotrigine 25 mg  2 tabs once daily (2 at bedtime), Lansoprazole 30 mg daily (breakfast), Rosuvastatin 20 mg daily (bedtime),  Vascepa 1 gm - 2 caps bid (Breakfast & bedtime), Venlafaxine XR 75 mg daily (Breakfast),  Valacyclovir 1 gm- 2 tablet twice a day for one day as needed (vials), Airduo- 1 puff into lungs twice daily, Ventolin hfa - 2 puffs into lungs every 4 hours as needed, Nitroglycerin sub 0.4 mg - I tablet under tongue every 5 minutes as needed(vials).  Short fill not needed. Coordinated acute fill for Trulicity 3 GN/5.6 ml- Inject 0.5 ml  Sq once a week on Wednesday for delivery date of 08/11/2020.   Patient declined the following medications: Trulicity inj 3 OZ/3.0  ml - Inject 0.5 ml sq (once a week on Wednesday) due to receiving a 30 day supply on 07/13/20, Tizanidine 4 mg due to not taking any longer, OneTouch test strips due to receiving a refill from [facility] and Furosemide 20 mg due to using all of the 40 mg she has left and cutting them in half and also has a 30 day supply of 20 mg from [facility].   Patient needs refills for:  Flonase NS-1 spray each nostril daily, Lisinopril-hctz 10-12.5 mg- 1 tablet daily (Breakfast), Clopidogrel 75 mg- 1 tablet daily (breakfast), Fenofibrate 160 mg- 1 tablet daily (breakfast),  Lamotrigine 25 mg  2 tabs once daily (2 at bedtime), Lansoprazole 30 mg daily (breakfast), Rosuvastatin 20 mg daily (bedtime),  Vascepa 1 gm - 2 caps bid (Breakfast & bedtime),  Venlafaxine XR 75 mg daily (Breakfast),  Valacyclovir 1 gm- 2 tablet twice a day for one day as needed (vials).  Confirmed delivery date of 07/21/2020, advised patient that pharmacy will contact them the morning of delivery.   Follow-Up:  Coordination of Enhanced Pharmacy Services and Pharmacist Review - Patient mentioned that she was at the [employer] one day and got a small cut that bled heavily for a while. Patient states she bled from the store to home and through 2 Band-Aid and a cigarette butt in which she placed on the cut to help her stop bleeding. Patient stated the cut was better but she was concerned about the bleeding. Patient aware I will relay this incident to her pharmacist [name], CPP. Patient would also like to have a 90 day supply of her Valacyclovir that she uses as need for cold sores and she has had a few more breakouts than usual. While reviewing medications, inquired about her Nitroglycerin, patient states she had some but wasn't sure how many or when she received them, possibly expired. Informed patient we will deliver new prescription and she should discard old bottle especially if expired, it would not help her in the case of chest pains. Patient aware and agrees. [name], CPP notified.   Pattricia Boss, [place] Pharmacist Assistant [phone]

## 2020-07-16 ENCOUNTER — Other Ambulatory Visit: Payer: Self-pay | Admitting: Family Medicine

## 2020-07-16 MED ORDER — TRULICITY 3 MG/0.5ML ~~LOC~~ SOAJ
3.0000 mg | SUBCUTANEOUS | 2 refills | Status: DC
Start: 2020-07-16 — End: 2020-08-10

## 2020-07-18 ENCOUNTER — Other Ambulatory Visit: Payer: Self-pay | Admitting: Family Medicine

## 2020-08-04 ENCOUNTER — Telehealth: Payer: Self-pay

## 2020-08-04 NOTE — Chronic Care Management (AMB) (Signed)
Chronic Care Management Pharmacy Assistant   Name: Sonya Small  MRN: [id number] DOB: [birth year]  Reason for Encounter: Medication Review  PCP : [name], MD    08/04/2020- Patient called with inquiry on next delivery for Trulicity, she takes her weekly dose every Wednesday and she is down to her last pen for next week 08/10/2020 dose. Patient aware that I have already scheduled her delivery of Trulicity for 95/63/8756 and pharmacy is aware.  Patient also complained that with pick up on Xanax today she noticed they only gave her #60 when she usually gets a #90, patient counted her pills when she arrived home but because it is controlled pharmacy is unable to take back. Patient aware that I will call [facility] to inquire about quantity dispensed. Patient also inquired about getting another pain cream, she used Diclofenac gel in the past but insurance will not cover. Patient aware I will send pharmacist [name] a message on what her PCP recommends trying and will call back.  [name], CPP notified.  Called [facility], spoke with Whitney regarding Xanax shortage per patient. After reviewing system, Whitney confirmed that they did short her on her Xanax by #30, they are getting that supply ready for patient to pick up. Called patient to advise, she was very appreciative for the help and will pick medication up. Patient aware that we are still waiting on a response regarding pain cream.     Allergies:  No Known Allergies  Medications: Outpatient Encounter Medications as of 08/04/2020  Medication Sig  . albuterol (VENTOLIN HFA) 108 (90 Base) MCG/ACT inhaler Inhale 2 puffs into the lungs every 4 (four) hours as needed for wheezing or shortness of breath.  . ALPRAZolam (XANAX) 0.5 MG tablet Take 1 tablet (0.5 mg total) by mouth 3 (three) times daily.  . Aspirin-Acetaminophen (GOODYS BODY PAIN PO) Take 1 packet by mouth daily.  . clopidogrel (PLAVIX) 75 MG tablet TAKE ONE (1) TABLET BY MOUTH ONCE  DAILY  . diclofenac Sodium (VOLTAREN) 1 % GEL   . Dulaglutide (TRULICITY) 3 EP/3.2RJ SOPN Inject 3 mg as directed once a week.  . fenofibrate 160 MG tablet TAKE ONE (1) TABLET ONCE DAILY  . fluticasone (FLONASE) 50 MCG/ACT nasal spray Place 1 spray into both nostrils daily.  . Fluticasone-Salmeterol,sensor, (AIRDUO DIGIHALER) 113-14 MCG/ACT AEPB Inhale 1 puff into the lungs in the morning and at bedtime.  . furosemide (LASIX) 20 MG tablet Take 1 tablet (20 mg total) by mouth daily.  . furosemide (LASIX) 40 MG tablet TAKE 2 TABLETS BY MOUTH EVERY DAY  . glucose blood (ONETOUCH VERIO) test strip USE DAILY TO CHECK YOUR BLOOD SUGAR (E11.69)  . icosapent Ethyl (VASCEPA) 1 g capsule TAKE 2 CAPSULES TWICE A DAY WITH FOOD  . lamoTRIgine (LAMICTAL) 25 MG tablet TAKE TWO TABLETS BY MOUTH EVERYDAY AT BEDTIME  . lansoprazole (PREVACID) 30 MG capsule TAKE ONE CAPSULE BY MOUTH EVERY MORNING  . lisinopril-hydrochlorothiazide (ZESTORETIC) 10-12.5 MG tablet Take 1 tablet by mouth daily.  . nitroGLYCERIN (NITROSTAT) 0.4 MG SL tablet Place 1 tablet (0.4 mg total) under the tongue every 5 (five) minutes as needed.  [name] VERIO test strip USE DAILY TO CHECK YOUR BLOOD SUGAR (E11.69)  . oxyCODONE-acetaminophen (PERCOCET) 10-325 MG tablet Take 1 tablet by mouth every 6 (six) hours as needed.  . rosuvastatin (CRESTOR) 20 MG tablet TAKE ONE (1) TABLET BY MOUTH ONCE DAILY  . TRUEplus Lancets 30G MISC 1 each by Does not apply route  2 (two) times daily.  . valACYclovir (VALTREX) 1000 MG tablet TAKE TWO TABLETS BY MOUTH twice A DAY FOR ONE DAY as needed  . venlafaxine XR (EFFEXOR-XR) 75 MG 24 hr capsule Take 1 capsule (75 mg total) by mouth every morning.   No facility-administered encounter medications on file as of 08/04/2020.    Current Diagnosis: Patient Active Problem List   Diagnosis Date Noted  . Severe recurrent major depression without psychotic features ([place]) 05/09/2020  . Atypical pigmented skin  lesion 05/09/2020  . Dizziness 02/07/2020  . Dermatitis 01/12/2020  . Mixed stress and urge urinary incontinence 12/25/2019  . Localized edema 12/18/2019  . Dry mouth 12/18/2019  . Diabetic polyneuropathy associated with type 2 diabetes mellitus ([place]) 12/16/2019  . Weakness 12/07/2019  . Ingrown toenail of both feet 12/07/2019  . Moderate recurrent major depression ([place]) 12/03/2019  . Cigarette nicotine dependence with nicotine-induced disorder 12/03/2019  . Type 2 diabetes mellitus with hypertriglyceridemia ([place]) 12/03/2019  . Hypertensive heart disease   . Oral thrush 10/28/2015  . Pituitary adenoma ([place]) 02/22/2015  . Cerebral infarction due to thrombosis of left middle cerebral artery ([place]) 08/23/2014  . HLD (hyperlipidemia) 08/23/2014  . Essential hypertension 08/23/2014  . PVD (peripheral vascular disease) ([place]) 08/23/2014  . Former smoker 08/23/2014  . COPD (chronic obstructive pulmonary disease) ([place]) 07/13/2014  . OSA (obstructive sleep apnea) 07/13/2014  . Chronic diastolic heart failure ([place]) 07/13/2014  . Stroke ([place]) 07/12/2014  . HTN (hypertension) 07/12/2014  . Peripheral vascular disease, unspecified ([place]) 02/17/2013  . Aftercare following surgery of the circulatory system, [place] 02/17/2013  . Chest pain 10/16/2012  . Coronary artery disease   . PAD (peripheral artery disease) ([place])   . Hyperlipidemia   . Tobacco abuse   . History of myocardial infarction 01/22/2010    Follow-Up:  Coordination of Enhanced Pharmacy Services and Pharmacist Review  [name], CPP notified. Patient has a follow up visit 08/10/2020. Pain cream to be discussed then.   Pattricia Boss, [place] Pharmacist Assistant [phone]

## 2020-08-07 ENCOUNTER — Other Ambulatory Visit: Payer: Self-pay | Admitting: Family Medicine

## 2020-08-10 ENCOUNTER — Ambulatory Visit: Payer: Medicare Other

## 2020-08-10 ENCOUNTER — Other Ambulatory Visit: Payer: Self-pay | Admitting: Family Medicine

## 2020-08-10 ENCOUNTER — Other Ambulatory Visit: Payer: Self-pay

## 2020-08-10 DIAGNOSIS — E782 Mixed hyperlipidemia: Secondary | ICD-10-CM

## 2020-08-10 DIAGNOSIS — I10 Essential (primary) hypertension: Secondary | ICD-10-CM

## 2020-08-10 NOTE — Chronic Care Management (AMB) (Signed)
Chronic Care Management Pharmacy  Name: Sonya Small  MRN: [id number] DOB: 05-27-1951  Chief Complaint/ HPI  Sonya Small,  69 y.o. , female presents for their Follow-Up CCM visit with the clinical pharmacist via telephone due to COVID-19 Pandemic.  PCP : [name], MD  Their chronic conditions include: Chronic Diastolic Heart Failure, CAD, PAD, HTN, Hx of stroke, COPD, DM, HLD, Depression, Urge incontinence.   Office Visits: 61/[id number]/[id number] - continue Trulicity 3 mg weekly. Restart Lasix 20 mg daily. Refer to dermatology.   Consult Visit: 06/22/2020 - recommend smoking cessation.   06/01/2020 - Podiatry - follow-up to ingrown nail.   Medications: Outpatient Encounter Medications as of 08/10/2020  Medication Sig  . TRULICITY 3 GG/2.6RS SOPN Inject 3 mg as directed once a week.  . valACYclovir (VALTREX) 1000 MG tablet TAKE TWO TABLETS BY MOUTH twice A DAY FOR ONE DAY as needed  . albuterol (VENTOLIN HFA) 108 (90 Base) MCG/ACT inhaler Inhale 2 puffs into the lungs every 4 (four) hours as needed for wheezing or shortness of breath.  . ALPRAZolam (XANAX) 0.5 MG tablet Take 1 tablet (0.5 mg total) by mouth 3 (three) times daily.  . Aspirin-Acetaminophen (GOODYS BODY PAIN PO) Take 1 packet by mouth daily.  . clopidogrel (PLAVIX) 75 MG tablet TAKE ONE (1) TABLET BY MOUTH ONCE DAILY  . diclofenac Sodium (VOLTAREN) 1 % GEL   . fenofibrate 160 MG tablet TAKE ONE (1) TABLET ONCE DAILY  . fluticasone (FLONASE) 50 MCG/ACT nasal spray PLACE ONE SPRAY INTO BOTH NOSTRILS DAILY  . Fluticasone-Salmeterol,sensor, (AIRDUO DIGIHALER) 113-14 MCG/ACT AEPB Inhale 1 puff into the lungs in the morning and at bedtime.  . furosemide (LASIX) 20 MG tablet Take 1 tablet (20 mg total) by mouth daily.  . furosemide (LASIX) 40 MG tablet TAKE 2 TABLETS BY MOUTH EVERY DAY  . glucose blood (ONETOUCH VERIO) test strip USE DAILY TO CHECK YOUR BLOOD SUGAR (E11.69)  . icosapent Ethyl (VASCEPA) 1 g capsule TAKE 2  CAPSULES TWICE A DAY WITH FOOD  . lamoTRIgine (LAMICTAL) 25 MG tablet TAKE TWO TABLETS BY MOUTH EVERYDAY AT BEDTIME  . lansoprazole (PREVACID) 30 MG capsule TAKE ONE CAPSULE BY MOUTH EVERY MORNING  . lisinopril-hydrochlorothiazide (ZESTORETIC) 10-12.5 MG tablet Take 1 tablet by mouth daily.  . nitroGLYCERIN (NITROSTAT) 0.4 MG SL tablet Place 1 tablet (0.4 mg total) under the tongue every 5 (five) minutes as needed.  [name] VERIO test strip USE DAILY TO CHECK YOUR BLOOD SUGAR (E11.69)  . oxyCODONE-acetaminophen (PERCOCET) 10-325 MG tablet Take 1 tablet by mouth every 6 (six) hours as needed.  . rosuvastatin (CRESTOR) 20 MG tablet TAKE ONE (1) TABLET BY MOUTH ONCE DAILY  . TRUEplus Lancets 30G MISC 1 each by Does not apply route 2 (two) times daily.  [name] venlafaxine XR (EFFEXOR-XR) 75 MG 24 hr capsule Take 1 capsule (75 mg total) by mouth every morning.  . [DISCONTINUED] Dulaglutide (TRULICITY) 3 WN/4.6EV SOPN Inject 3 mg as directed once a week.   No facility-administered encounter medications on file as of 08/10/2020.   No Known Allergies  SDOH Screenings   Alcohol Screen:   . Last Alcohol Screening Score (AUDIT): Not on file  Depression (PHQ2-9): Medium Risk  . PHQ-2 Score: 21  Financial Resource Strain:   . Difficulty of Paying Living Expenses: Not on file  Food Insecurity: No Food Insecurity  . Worried About Charity fundraiser in the Last Year: Never true  . Ran Out of Food in  the Last Year: Never true  Housing: Low Risk   . Last Housing Risk Score: 0  Physical Activity:   . Days of Exercise per Week: Not on file  . Minutes of Exercise per Session: Not on file  Social Connections:   . Frequency of Communication with Friends and Family: Not on file  . Frequency of Social Gatherings with Friends and Family: Not on file  . Attends Religious Services: Not on file  . Active Member of Clubs or Organizations: Not on file  . Attends Archivist Meetings: Not on file  .  Marital Status: Not on file  Stress: Stress Concern Present  . Feeling of Stress : Rather much  Tobacco Use: High Risk  . Smoking Tobacco Use: Current Every Day Smoker  . Smokeless Tobacco Use: Never Used  Transportation Needs: No Transportation Needs  . Lack of Transportation (Medical): No  . Lack of Transportation (Non-Medical): No   Current Diagnosis/Assessment:  Goals Addressed            This Visit's Progress   . Pharmacy Care Plan - DM       CARE PLAN ENTRY (see longitudinal plan of care for additional care plan information)  Current Barriers:  . Chronic Disease Management support, education, and care coordination needs related to Hypertension, Hyperlipidemia, and Diabetes   Hypertension BP Readings from Last 3 Encounters:  06/22/20 133/77  05/26/20 (!) 140/42  05/09/20 120/70   . Pharmacist Clinical Goal(s): o Over the next 90 days, patient will work with PharmD and providers to maintain BP goal <130/80 . Current regimen:  o Lisinopril-hctz 10-12.5 mg daily  o Furosemide 20 mg daily  . Interventions: o Reviewed home blood pressure readings.  o Discussed hip pain increasing blood pressure.  o Encouraged healthy diet.  . Patient self care activities - Over the next 90 days, patient will: o Check BP daily, document, and provide at future appointments o Ensure daily salt intake < 2300 mg/day  Hyperlipidemia Lab Results  Component Value Date/Time   LDLCALC 82 05/09/2020 11:16 AM   . Pharmacist Clinical Goal(s): o Over the next 90 days, patient will work with PharmD and providers to achieve LDL goal < 70 . Current regimen:  o Rosuvastatin 20 mg daily  . Interventions: o Discussed healthier diet.  . Patient self care activities - Over the next 90 days, patient will: o Continue taking medication as prescribed.   Diabetes Lab Results  Component Value Date/Time   HGBA1C 7.4 (H) 05/09/2020 11:16 AM   HGBA1C 7.1 (H) 12/04/2019 10:20 AM   . Pharmacist  Clinical Goal(s): o Over the next 90 days, patient will work with PharmD and providers to achieve A1c goal <7% . Current regimen:  o Trulicity 3 mg weekly  . Interventions: o Discussed healthy diet and encouraged continuing to limit portions of carbohydrates. [name]  o Discussed limitation on exercise with hip pain.  o Reviewed home blood sugar readings.  . Patient self care activities - Over the next 90 days, patient will: o Check blood sugar once daily, document, and provide at future appointments o Contact provider with any episodes of hypoglycemia  Medication management . Pharmacist Clinical Goal(s): o Over the next 90 days, patient will work with PharmD and providers to maintain optimal medication adherence . Current pharmacy: [facility] . Interventions o Comprehensive medication review performed. o Utilize [facility] pharmacy for medication synchronization, packaging and delivery . Patient self care activities - Over the next 90 days, patient  will: o Focus on medication adherence by packaging/delivery/synchronization o Take medications as prescribed o Report any questions or concerns to PharmD and/or provider(s)  Please see past updates related to this goal by clicking on the "Past Updates" button in the selected goal         Diabetes   Recent Relevant Labs: Lab Results  Component Value Date/Time   HGBA1C 7.4 (H) 05/09/2020 11:16 AM   HGBA1C 7.1 (H) 12/04/2019 10:20 AM   MICROALBUR 30 12/04/2019 10:43 AM    Lab Results  Component Value Date   CREATININE 0.95 05/09/2020   BUN 33 (H) 05/09/2020   GFRNONAA 61 05/09/2020   GFRAA 70 05/09/2020   NA 139 05/09/2020   K 3.7 05/09/2020   CALCIUM 9.9 05/09/2020   CO2 28 05/09/2020   Checking BG: Daily  Recent FBG Readings: 133, 128, 135, 118, 105, 127, 101, 118, 128, 134, 106 Patient has failed these meds in past: farxiga 5 mg, metformin xr 750 mg, janumet 50-500 mg bid. Patient is currently uncontrolled on the following  medications:   trulicity 3 mg weekly  Last diabetic Foot exam: 12/16/2019 Last diabetic Eye exam: 06/2020  We discussed: Patient's blood sugar readings are improving. She states that her elevated readings are due to poor diet choices or splurges. Patient reports not consuming near as many carbohydrates and desserts. Patient is unable to exercise currently due to hip pain.   Plan  Continue current medications,    Hypertension   BP today is:  112/78 mmHg  Office blood pressures are  BP Readings from Last 3 Encounters:  06/22/20 133/77  05/26/20 (!) 140/42  05/09/20 120/70   Patient has failed these meds in the past: hctz, torsemide, lisinopril, amlodipine, metoprolol Patient is currently uncontrolled on the following medications:    furosemide 20 mg daily  lisinopril- hctz 10-12.5 mg  Patient checks BP at home daily  Patient home BP readings are ranging: 117/78, 112/76, 110/73, 117/76, 141/94, 121/76, 117/79, 133/83, 134/89, 137/88, 128/86 mmHg  We discussed diet and exercise extensively. Patient does all the cooking for her family. Reports home blood pressure readings are improved. BP occasionally above goal due to increased pain. Patient reports still smoking "too much". She states that there is no way to reduce right now due to hip/leg pain. Patient plans to discuss hip pain at upcoming visit with Dr. [name].   Plan Continue current medication as prescribed.   Vaccines   Reviewed and discussed patient's vaccination history.  Moderna COVID Booster 08/29/2020 @ Dr. Maxie Barb office. Patient does not want to get too close to [facility] booster already scheduled with Dr. Alcide Clever. Pharmacist coordinated appointment in office prior to appointment.    Immunization History  Administered Date(s) Administered  . Influenza,inj,Quad PF,6+ Mos 07/13/2014  . Influenza-Unspecified 05/29/2019  . Pneumococcal Polysaccharide-23 06/26/2011    Plan  Recommended patient receive annual flu  vaccine in office 08/11/2020.     Medication Management   Pt uses [facility]  Uses pill box? Packaging and Delivery  We discussed: Patient is doing well with packaging/delivery.   Verbal consent obtained for [facility] enhanced pharmacy services (medication synchronization, adherence packaging, delivery coordination). A medication sync plan was created to allow patient to get all medications delivered once every 30 to 90 days per patient preference. Patient understands they have freedom to choose pharmacy and clinical pharmacist will coordinate care between all prescribers and [facility].  Plan  Utilize [facility] pharmacy for medication synchronization, packaging and delivery  Follow up: 3 month phone visit

## 2020-08-11 ENCOUNTER — Ambulatory Visit (INDEPENDENT_AMBULATORY_CARE_PROVIDER_SITE_OTHER): Payer: Medicare Other

## 2020-08-11 DIAGNOSIS — Z23 Encounter for immunization: Secondary | ICD-10-CM

## 2020-08-11 NOTE — Patient Instructions (Signed)
Visit Information  Goals Addressed            This Visit's Progress   . Pharmacy Care Plan - DM       CARE PLAN ENTRY (see longitudinal plan of care for additional care plan information)  Current Barriers:  . Chronic Disease Management support, education, and care coordination needs related to Hypertension, Hyperlipidemia, and Diabetes   Hypertension BP Readings from Last 3 Encounters:  06/22/20 133/77  05/26/20 (!) 140/42  05/09/20 120/70   . Pharmacist Clinical Goal(s): o Over the next 90 days, patient will work with PharmD and providers to maintain BP goal <130/80 . Current regimen:  o Lisinopril-hctz 10-12.5 mg daily  o Furosemide 20 mg daily  . Interventions: o Reviewed home blood pressure readings.  o Discussed hip pain increasing blood pressure.  o Encouraged healthy diet.  . Patient self care activities - Over the next 90 days, patient will: o Check BP daily, document, and provide at future appointments o Ensure daily salt intake < 2300 mg/day  Hyperlipidemia Lab Results  Component Value Date/Time   LDLCALC 82 05/09/2020 11:16 AM   . Pharmacist Clinical Goal(s): o Over the next 90 days, patient will work with PharmD and providers to achieve LDL goal < 70 . Current regimen:  o Rosuvastatin 20 mg daily  . Interventions: o Discussed healthier diet.  . Patient self care activities - Over the next 90 days, patient will: o Continue taking medication as prescribed.   Diabetes Lab Results  Component Value Date/Time   HGBA1C 7.4 (H) 05/09/2020 11:16 AM   HGBA1C 7.1 (H) 12/04/2019 10:20 AM   . Pharmacist Clinical Goal(s): o Over the next 90 days, patient will work with PharmD and providers to achieve A1c goal <7% . Current regimen:  o Trulicity 3 mg weekly  . Interventions: o Discussed healthy diet and encouraged continuing to limit portions of carbohydrates. [name]  o Discussed limitation on exercise with hip pain.  o Reviewed home blood sugar readings.   . Patient self care activities - Over the next 90 days, patient will: o Check blood sugar once daily, document, and provide at future appointments o Contact provider with any episodes of hypoglycemia  Medication management . Pharmacist Clinical Goal(s): o Over the next 90 days, patient will work with PharmD and providers to maintain optimal medication adherence . Current pharmacy: [facility] . Interventions o Comprehensive medication review performed. o Utilize [facility] pharmacy for medication synchronization, packaging and delivery . Patient self care activities - Over the next 90 days, patient will: o Focus on medication adherence by packaging/delivery/synchronization o Take medications as prescribed o Report any questions or concerns to PharmD and/or provider(s)  Please see past updates related to this goal by clicking on the "Past Updates" button in the selected goal         The patient verbalized understanding of instructions, educational materials, and care plan provided today and declined offer to receive copy of patient instructions, educational materials, and care plan.   Telephone follow up appointment with pharmacy team member scheduled for: 10/2020  Sherre Poot, PharmD, [facility] Clinical Pharmacist [name] [phone] (office) [phone] (mobile)   Blood Glucose Monitoring, Adult Monitoring your blood sugar (glucose) is an important part of managing your diabetes (diabetes mellitus). Blood glucose monitoring involves checking your blood glucose as often as directed and keeping a record (log) of your results over time. Checking your blood glucose regularly and keeping a blood glucose log can:  Help  you and your health care provider adjust your diabetes management plan as needed, including your medicines or insulin.  Help you understand how food, exercise, illnesses, and medicines affect your blood glucose.  Let you know what your blood glucose is  at any time. You can quickly find out if you have low blood glucose (hypoglycemia) or high blood glucose (hyperglycemia). Your health care provider will set individualized treatment goals for you. Your goals will be based on your age, other medical conditions you have, and how you respond to diabetes treatment. Generally, the goal of treatment is to maintain the following blood glucose levels:  Before meals (preprandial): 80-130 mg/dL (4.4-7.2 mmol/L).  After meals (postprandial): below 180 mg/dL (10 mmol/L).  A1c level: less than 7%. Supplies needed:  Blood glucose meter.  Test strips for your meter. Each meter has its own strips. You must use the strips that came with your meter.  A needle to prick your finger (lancet). Do not use a lancet more than one time.  A device that holds the lancet (lancing device).  A journal or log book to write down your results. How to check your blood glucose  1. Wash your hands with soap and water. 2. Prick the side of your finger (not the tip) with the lancet. Use a different finger each time. 3. Gently rub the finger until a small drop of blood appears. 4. Follow instructions that come with your meter for inserting the test strip, applying blood to the strip, and using your blood glucose meter. 5. Write down your result and any notes. Some meters allow you to use areas of your body other than your finger (alternative sites) to test your blood. The most common alternative sites are:  Forearm.  Thigh.  Palm of the hand. If you think you may have hypoglycemia, or if you have a history of not knowing when your blood glucose is getting low (hypoglycemia unawareness), do not use alternative sites. Use your finger instead. Alternative sites may not be as accurate as the fingers, because blood flow is slower in these areas. This means that the result you get may be delayed, and it may be different from the result that you would get from your  finger. Follow these instructions at home: Blood glucose log   Every time you check your blood glucose, write down your result. Also write down any notes about things that may be affecting your blood glucose, such as your diet and exercise for the day. This information can help you and your health care provider: ? Look for patterns in your blood glucose over time. ? Adjust your diabetes management plan as needed.  Check if your meter allows you to download your records to a computer. Most glucose meters store a record of glucose readings in the meter. If you have type 1 diabetes:  Check your blood glucose 2 or more times a day.  Also check your blood glucose: ? Before every insulin injection. ? Before and after exercise. ? Before meals. ? 2 hours after a meal. ? Occasionally between 2:00 a.m. and 3:00 a.m., as directed. ? Before potentially dangerous tasks, like driving or using heavy machinery. ? At bedtime.  You may need to check your blood glucose more often, up to 6-10 times a day, if you: ? Use an insulin pump. ? Need multiple daily injections (MDI). ? Have diabetes that is not well-controlled. ? Are ill. ? Have a history of severe hypoglycemia. ? Have hypoglycemia unawareness.  If you have type 2 diabetes:  If you take insulin or other diabetes medicines, check your blood glucose 2 or more times a day.  If you are on intensive insulin therapy, check your blood glucose 4 or more times a day. Occasionally, you may also need to check between 2:00 a.m. and 3:00 a.m., as directed.  Also check your blood glucose: ? Before and after exercise. ? Before potentially dangerous tasks, like driving or using heavy machinery.  You may need to check your blood glucose more often if: ? Your medicine is being adjusted. ? Your diabetes is not well-controlled. ? You are ill. General tips  Always keep your supplies with you.  If you have questions or need help, all blood glucose  meters have a 24-hour "hotline" phone number that you can call. You may also contact your health care provider.  After you use a few boxes of test strips, adjust (calibrate) your blood glucose meter by following instructions that came with your meter. Contact a health care provider if:  Your blood glucose is at or above 240 mg/dL (13.3 mmol/L) for 2 days in a row.  You have been sick or have had a fever for 2 days or longer, and you are not getting better.  You have any of the following problems for more than 6 hours: ? You cannot eat or drink. ? You have nausea or vomiting. ? You have diarrhea. Get help right away if:  Your blood glucose is lower than 54 mg/dL (3 mmol/L).  You become confused or you have trouble thinking clearly.  You have difficulty breathing.  You have moderate or large ketone levels in your urine. Summary  Monitoring your blood sugar (glucose) is an important part of managing your diabetes (diabetes mellitus).  Blood glucose monitoring involves checking your blood glucose as often as directed and keeping a record (log) of your results over time.  Your health care provider will set individualized treatment goals for you. Your goals will be based on your age, other medical conditions you have, and how you respond to diabetes treatment.  Every time you check your blood glucose, write down your result. Also write down any notes about things that may be affecting your blood glucose, such as your diet and exercise for the day. This information is not intended to replace advice given to you by your health care provider. Make sure you discuss any questions you have with your health care provider. Document Revised: 07/04/2018 Document Reviewed: 02/20/2016 Elsevier Patient Education  2020 [employer].

## 2020-08-16 ENCOUNTER — Telehealth: Payer: Self-pay

## 2020-08-16 NOTE — Chronic Care Management (AMB) (Signed)
Chronic Care Management Pharmacy Assistant   Name: Sonya Small  MRN: [id number] DOB: May 14, 1951  Reason for Encounter: Medication Review  Patient Questions:  1.  Have you seen any other providers since your last visit? No   2.  Any changes in your medicines or health? No    PCP : [name], MD  Allergies:  No Known Allergies  Medications: Outpatient Encounter Medications as of 08/16/2020  Medication Sig  . albuterol (VENTOLIN HFA) 108 (90 Base) MCG/ACT inhaler Inhale 2 puffs into the lungs every 4 (four) hours as needed for wheezing or shortness of breath.  . ALPRAZolam (XANAX) 0.5 MG tablet Take 1 tablet (0.5 mg total) by mouth 3 (three) times daily.  . Aspirin-Acetaminophen (GOODYS BODY PAIN PO) Take 1 packet by mouth daily.  . clopidogrel (PLAVIX) 75 MG tablet TAKE ONE (1) TABLET BY MOUTH ONCE DAILY  . diclofenac Sodium (VOLTAREN) 1 % GEL   . fenofibrate 160 MG tablet TAKE ONE (1) TABLET ONCE DAILY  . fluticasone (FLONASE) 50 MCG/ACT nasal spray PLACE ONE SPRAY INTO BOTH NOSTRILS DAILY  . Fluticasone-Salmeterol,sensor, (AIRDUO DIGIHALER) 113-14 MCG/ACT AEPB Inhale 1 puff into the lungs in the morning and at bedtime.  . furosemide (LASIX) 20 MG tablet Take 1 tablet (20 mg total) by mouth daily.  [name] glucose blood (ONETOUCH VERIO) test strip USE DAILY TO CHECK YOUR BLOOD SUGAR (E11.69)  . icosapent Ethyl (VASCEPA) 1 g capsule TAKE 2 CAPSULES TWICE A DAY WITH FOOD  . lamoTRIgine (LAMICTAL) 25 MG tablet TAKE TWO TABLETS BY MOUTH EVERYDAY AT BEDTIME  . lansoprazole (PREVACID) 30 MG capsule TAKE ONE CAPSULE BY MOUTH EVERY MORNING  . lisinopril-hydrochlorothiazide (ZESTORETIC) 10-12.5 MG tablet Take 1 tablet by mouth daily.  . nitroGLYCERIN (NITROSTAT) 0.4 MG SL tablet Place 1 tablet (0.4 mg total) under the tongue every 5 (five) minutes as needed.  [name] VERIO test strip USE DAILY TO CHECK YOUR BLOOD SUGAR (E11.69)  . oxyCODONE-acetaminophen (PERCOCET) 10-325 MG tablet  Take 1 tablet by mouth every 6 (six) hours as needed.  . rosuvastatin (CRESTOR) 20 MG tablet TAKE ONE (1) TABLET BY MOUTH ONCE DAILY  . TRUEplus Lancets 30G MISC 1 each by Does not apply route 2 (two) times daily.  . TRULICITY 3 XN/1.7GY SOPN Inject 3 mg as directed once a week.  . valACYclovir (VALTREX) 1000 MG tablet TAKE TWO TABLETS BY MOUTH twice A DAY FOR ONE DAY as needed  . venlafaxine XR (EFFEXOR-XR) 75 MG 24 hr capsule Take 1 capsule (75 mg total) by mouth every morning.   No facility-administered encounter medications on file as of 08/16/2020.    Current Diagnosis: Patient Active Problem List   Diagnosis Date Noted  . Severe recurrent major depression without psychotic features ([place]) 05/09/2020  . Atypical pigmented skin lesion 05/09/2020  . Dizziness 02/07/2020  . Dermatitis 01/12/2020  . Mixed stress and urge urinary incontinence 12/25/2019  . Localized edema 12/18/2019  . Dry mouth 12/18/2019  . Diabetic polyneuropathy associated with type 2 diabetes mellitus ([place]) 12/16/2019  . Weakness 12/07/2019  . Ingrown toenail of both feet 12/07/2019  . Moderate recurrent major depression ([place]) 12/03/2019  . Cigarette nicotine dependence with nicotine-induced disorder 12/03/2019  . Type 2 diabetes mellitus with hypertriglyceridemia ([place]) 12/03/2019  . Hypertensive heart disease   . Oral thrush 10/28/2015  . Pituitary adenoma ([place]) 02/22/2015  . Cerebral infarction due to thrombosis of left middle cerebral artery ([place]) 08/23/2014  . HLD (hyperlipidemia) 08/23/2014  .  Essential hypertension 08/23/2014  . PVD (peripheral vascular disease) ([place]) 08/23/2014  . Former smoker 08/23/2014  . COPD (chronic obstructive pulmonary disease) ([place]) 07/13/2014  . OSA (obstructive sleep apnea) 07/13/2014  . Chronic diastolic heart failure ([place]) 07/13/2014  . Stroke ([place]) 07/12/2014  . HTN (hypertension) 07/12/2014  . Peripheral vascular disease, unspecified ([place]) 02/17/2013  . Aftercare  following surgery of the circulatory system, [place] 02/17/2013  . Chest pain 10/16/2012  . Coronary artery disease   . PAD (peripheral artery disease) ([place])   . Hyperlipidemia   . Tobacco abuse   . History of myocardial infarction 01/22/2010   Reviewed chart for medication changes ahead of medication coordination call.  No OVs, Consults, or hospital visits since last care coordination call/Pharmacist visit.   No medication changes indicated.  BP Readings from Last 3 Encounters:  06/22/20 133/77  05/26/20 (!) 140/42  05/09/20 120/70    Lab Results  Component Value Date   HGBA1C 7.4 (H) 05/09/2020     Patient obtains medications through Adherence Packaging  90 Days   Last adherence delivery included: Flonase NS-1 spray each nostril daily Lisinopril-hctz 10-12.5 mg- 1 tablet daily (Breakfast) Clopidogrel 75 mg- 1 tablet daily (breakfast) Fenofibrate 160 mg- 1 tablet daily (breakfast) Lamotrigine 25 mg 2 tabs once daily (2 at bedtime) Lansoprazole 30 mg daily (breakfast) Rosuvastatin 20 mg daily (bedtime) Vascepa 1 gm -2 caps bid (Breakfast &bedtime) Venlafaxine XR 75 mg daily (Breakfast) Valacyclovir 1 gm- 2 tablet twice a day for one day as needed (vials) Airduo- 1 puff into lungs twice daily Ventolin hfa - 2 puffs into lungs every 4 hours as needed Nitroglycerin sub 0.4 mg - I tablet under tongue every 5 minutes as needed(vials).  Patient declined the following medications last month:  Trulicity inj 3 HL/4.5 ml - Inject 0.5 ml sq (once a week on Wednesday) due to receiving a 30 day supply on 07/13/20 Tizanidine 4 mg due to not taking any longer OneTouch test strips due to receiving a refill from [facility] Furosemide 20 mg due to using all of the 40 mg she has left and cutting them in half and also has a 30 day supply of 20 mg from [facility]  Patient is due for next adherence delivery on: 08/19/2020  Called patient and reviewed medications.  This delivery to include: NO  MEDICATIONS  Patient will not need a short fill of  prior to adherence delivery.  Did not coordinated acute fill to be delivered.  Patient declined the following medications:   Flonase NS-1 spray each nostril daily. Due to receiving 3 month supply on 07/21/20. Lisinopril-hctz 10-12.5 mg- 1 tablet daily (Breakfast)  Due to receiving 3 month supply on 07/21/20 Clopidogrel 75 mg- 1 tablet daily (breakfast)  Due to receiving 3 month supply on 07/21/20 Fenofibrate 160 mg- 1 tablet daily (breakfast)  Due to receiving 3 month supply on 07/21/20 Lamotrigine 25 mg 2 tabs once daily (2 at bedtime)  Due to receiving 3 month supply on 07/21/20 Lansoprazole 30 mg daily (breakfast)  Due to receiving 3 month supply on 07/21/20 Rosuvastatin 20 mg daily (bedtime)  Due to receiving 3 month supply on 07/21/20 Vascepa 1 gm -2 caps bid (Breakfast &bedtime)  Due to receiving 3 month supply on 07/21/20 Venlafaxine XR 75 mg daily (Breakfast)  Due to receiving 3 month supply on 07/21/20 Valacyclovir 1 gm- 2 tablet twice a day for one day as needed (vials) PRN use Airduo- 1 puff into lungs twice daily  Due to  receiving 3 month supply on 07/21/20 Ventolin hfa - 2 puffs into lungs every 4 hours as needed  PRN use Nitroglycerin sub 0.4 mg - I tablet under tongue every 5 minutes as needed(vials). PRN use Trulicity inj 3 OV/2.9 ml - Inject 0.5 ml sq (once a week on Wednesday) due to receiving a 60 day supply 08/11/20 Tizanidine 4 mg due to not taking any longer OneTouch test strips due to receiving a refill [facility] Aripiprazole 5 mg take 1 tablet with breakfast. Pharmacy processing delivery 07/21/20. Alprazolam 0.5 mg gets from [facility].  Patient does not need refills for any medications at this time.     Follow-Up:  Coordination of [place], [place] notified  Margaretmary Dys, [place] Pharmacy Assistant [phone]

## 2020-08-17 ENCOUNTER — Ambulatory Visit (INDEPENDENT_AMBULATORY_CARE_PROVIDER_SITE_OTHER): Payer: Medicare Other | Admitting: Family Medicine

## 2020-08-17 ENCOUNTER — Other Ambulatory Visit: Payer: Self-pay

## 2020-08-17 ENCOUNTER — Encounter: Payer: Self-pay | Admitting: Family Medicine

## 2020-08-17 VITALS — BP 110/78 | HR 88 | Temp 96.0°F | Ht 65.0 in | Wt 164.0 lb

## 2020-08-17 DIAGNOSIS — F332 Major depressive disorder, recurrent severe without psychotic features: Secondary | ICD-10-CM

## 2020-08-17 DIAGNOSIS — I110 Hypertensive heart disease with heart failure: Secondary | ICD-10-CM

## 2020-08-17 DIAGNOSIS — I10 Essential (primary) hypertension: Secondary | ICD-10-CM

## 2020-08-17 DIAGNOSIS — M25551 Pain in right hip: Secondary | ICD-10-CM

## 2020-08-17 DIAGNOSIS — E781 Pure hyperglyceridemia: Secondary | ICD-10-CM

## 2020-08-17 DIAGNOSIS — E1169 Type 2 diabetes mellitus with other specified complication: Secondary | ICD-10-CM | POA: Diagnosis not present

## 2020-08-17 DIAGNOSIS — E782 Mixed hyperlipidemia: Secondary | ICD-10-CM | POA: Diagnosis not present

## 2020-08-17 DIAGNOSIS — I5022 Chronic systolic (congestive) heart failure: Secondary | ICD-10-CM

## 2020-08-17 DIAGNOSIS — F322 Major depressive disorder, single episode, severe without psychotic features: Secondary | ICD-10-CM

## 2020-08-17 MED ORDER — ALPRAZOLAM 0.5 MG PO TABS
0.5000 mg | ORAL_TABLET | Freq: Three times a day (TID) | ORAL | 1 refills | Status: DC
Start: 2020-08-17 — End: 2020-08-17

## 2020-08-17 MED ORDER — ALPRAZOLAM 0.5 MG PO TABS
0.5000 mg | ORAL_TABLET | Freq: Three times a day (TID) | ORAL | 0 refills | Status: DC
Start: 2020-08-17 — End: 2020-10-03

## 2020-08-17 MED ORDER — ARIPIPRAZOLE 5 MG PO TABS: 5.0000 mg | ORAL_TABLET | Freq: Every day | ORAL | 0 refills | Status: DC

## 2020-08-17 NOTE — Progress Notes (Signed)
Subjective:  Patient ID: Sonya Small, female    DOB: 04/15/1951  Age: 69 y.o. MRN: [id number]  Chief Complaint  Patient presents with   Hyperlipidemia   Hypertension   Diabetes    HPI Diabetes- See med list for medications.Taking trulicity. Sugars running  Comsumes a healthy diet does not exercise. Home blood glucose monitoring 100-150. Performs foot self-exams daily. Trulicity 3 mg weekly.    History of a myocardial infarction. Ttreatment regimen, daily 81 mg aspirin, Lopressor, and Crestor, fenofibrate, and vascepa.  No associated symptoms are reported.   Hypertension-( metoprolol ), amlodipine and Lasix  Hyperlipidemia. Crestor, Tricor, and Vascepa. Maintains her low cholesterol diet and maintains her exercise regimen.      Simple chronic bronchitis details; the duration of COPD has been several years.  Currently on Airduo and ventolin.  Breathing is good.   BL hip pain radiates down both legs. No edema in legs. Sees pain clinic. On percocet.   Depression: Brother in law died in August 02, 2023 from covid 12/28/2022 and nephew died from [place]. Currently on Effexor xr 75 mg once daily in am, xanax 0.5 mg one three times a day, lamictal 25 mg one twice a day. Very severe depression. Unchanged for months.  Intolerant or ineffective to wellbutrin xl, pristiq, lamictal, duloxetine, citalopram, and zoloft.    Current Outpatient Medications on File Prior to Visit  Medication Sig Dispense Refill   Aspirin-Acetaminophen (GOODYS BODY PAIN PO) Take 1 packet by mouth daily.     clopidogrel (PLAVIX) 75 MG tablet TAKE ONE (1) TABLET BY MOUTH ONCE DAILY 90 tablet 1   diclofenac Sodium (VOLTAREN) 1 % GEL      fenofibrate 160 MG tablet TAKE ONE (1) TABLET ONCE DAILY 90 tablet 1   fluticasone (FLONASE) 50 MCG/ACT nasal spray PLACE ONE SPRAY INTO BOTH NOSTRILS DAILY 16 g 1   Fluticasone-Salmeterol,sensor, (AIRDUO DIGIHALER) 113-14 MCG/ACT AEPB Inhale 1 puff into the lungs in the morning and at bedtime.       furosemide (LASIX) 20 MG tablet Take 1 tablet (20 mg total) by mouth daily. 90 tablet 3   glucose blood (ONETOUCH VERIO) test strip USE DAILY TO CHECK YOUR BLOOD SUGAR (E11.69)     icosapent Ethyl (VASCEPA) 1 g capsule TAKE 2 CAPSULES TWICE A DAY WITH FOOD 360 capsule 1   lamoTRIgine (LAMICTAL) 25 MG tablet TAKE TWO TABLETS BY MOUTH EVERYDAY AT BEDTIME 180 tablet 1   lansoprazole (PREVACID) 30 MG capsule TAKE ONE CAPSULE BY MOUTH EVERY MORNING 90 capsule 1   lisinopril-hydrochlorothiazide (ZESTORETIC) 10-12.5 MG tablet Take 1 tablet by mouth daily.     nitroGLYCERIN (NITROSTAT) 0.4 MG SL tablet Place 1 tablet (0.4 mg total) under the tongue every 5 (five) minutes as needed. 25 tablet 1   ONETOUCH VERIO test strip USE DAILY TO CHECK YOUR BLOOD SUGAR (E11.69) 100 each 3   oxyCODONE-acetaminophen (PERCOCET) 10-325 MG tablet Take 1 tablet by mouth every 6 (six) hours as needed.     rosuvastatin (CRESTOR) 20 MG tablet TAKE ONE (1) TABLET BY MOUTH ONCE DAILY 90 tablet 1   TRUEplus Lancets 30G MISC 1 each by Does not apply route 2 (two) times daily. 390 each 2   TRULICITY 3 ZE/0.9QZ SOPN Inject 3 mg as directed once a week. 2 mL 2   valACYclovir (VALTREX) 1000 MG tablet TAKE TWO TABLETS BY MOUTH twice A DAY FOR ONE DAY as needed 10 tablet 2   venlafaxine XR (EFFEXOR-XR) 75 MG 24 hr capsule  Take 1 capsule (75 mg total) by mouth every morning. 90 capsule 1   No current facility-administered medications on file prior to visit.   Past Medical History:  Diagnosis Date   Anxiety    CAD (coronary artery disease)    a. NSTEMI 11/2008 s/p DES to LCx (3.0x12 Xience); b. NSTEMI 01/2010 secondary to thrombotic RCA lesion (non-obstructive)-->med rx (integrilin x 24 hrs + plavix); c. 09/2012 negative Myoview.   Chronic diastolic CHF (congestive heart failure) ([place])    a. 06/2014 Echo: EF 55-60%, no rwma, Gr1 DD, mild AI.   Depression    Dizziness    Drug induced constipation    Generalized  hyperhidrosis    GERD (gastroesophageal reflux disease)    Headache    Hyperlipidemia    Hypertensive heart disease    Lumbar disc disease    Metabolic encephalopathy    Mixed hyperlipidemia    Myocardial infarction ([place])    Obstructive sleep apnea    OP (osteoporosis)    Osteoarthritis    Osteoporosis    Other malaise    Overweight(278.02)    PAD (peripheral artery disease) ([place])    a. Emboli to R foot 2010 from partially occlusive lesion in R EIA, s/p stenting. - followed by Dr. Donnetta Hutching;  b. 10/2015 ABIs: R 1.03, L 0.97.   Restless leg    Sleep apnea    Stroke (HCC)    TIA (transient ischemic attack)    Tobacco abuse    Urge incontinence    Past Surgical History:  Procedure Laterality Date   EYE SURGERY     at age 50   hysterectomy -age 58     ILIAC ARTERY STENT     RIGHT ILIAC STENT   KNEE ARTHROSCOPY     LUMBAR LAMINECTOMY     TUBAL LIGATION      Family History  Problem Relation Age of Onset   Alzheimer's disease Mother    Cancer Brother    Heart disease Other        Grandfather   Hepatitis C Brother    Diabetes Brother    Thyroid disease Brother    Hypertension Brother    Depression Brother    Social History   Socioeconomic History   Marital status: Divorced    Spouse name: Not on file   Number of children: 3   Years of education: 10 th   Highest education level: Not on file  Occupational History   Occupation: DISABLED    Employer: UNEMPLOYED  Tobacco Use   Smoking status: Current Every Day Smoker    Packs/day: 0.50    Types: Cigarettes    Last attempt to quit: 11/20/2012    Years since quitting: 7.7   Smokeless tobacco: Never Used  Substance and Sexual Activity   Alcohol use: No    Alcohol/week: 0.0 standard drinks   Drug use: No   Sexual activity: Never  Other Topics Concern   Not on file  Social History Narrative   Patient is single with 3 children, 1 deceased.   Patient is right handed.    Patient has 10 th grade education.   Patient drinks 5 or more cups daily.   Social Determinants of Health   Financial Resource Strain:    Difficulty of Paying Living Expenses: Not on file  Food Insecurity: No Food Insecurity   Worried About [place] in the Last Year: Never true   Ran Out of Food in the Last Year: Never  true  Transportation Needs: No Transportation Needs   Lack of Transportation (Medical): No   Lack of Transportation (Non-Medical): No  Physical Activity:    Days of Exercise per Week: Not on file   Minutes of Exercise per Session: Not on file  Stress: Stress Concern Present   Feeling of Stress : Rather much  Social Connections:    Frequency of Communication with Friends and Family: Not on file   Frequency of Social Gatherings with Friends and Family: Not on file   Attends Religious Services: Not on file   Active Member of Clubs or Organizations: Not on file   Attends Archivist Meetings: Not on file   Marital Status: Not on file    Review of Systems  Constitutional: Positive for diaphoresis (night). Negative for chills, fatigue and fever.  HENT: Negative for congestion, ear pain, rhinorrhea and sore throat.   Respiratory: Negative for cough and shortness of breath.   Cardiovascular: Negative for chest pain.  Gastrointestinal: Negative for abdominal pain, constipation, diarrhea, nausea and vomiting.  Genitourinary: Negative for dysuria and urgency.  Musculoskeletal: Negative for back pain and myalgias.  Neurological: Negative for dizziness, weakness, light-headedness and headaches.  Psychiatric/Behavioral: Negative for dysphoric mood. The patient is not nervous/anxious.      Objective:  BP 110/78    Pulse 88    Temp (!) 96 F (35.6 C)    Ht 5\' 5"  (1.651 m)    Wt 164 lb (74.4 kg)    SpO2 100%    BMI 27.29 kg/m   BP/Weight 08/17/2020 9/76/7341 05/27/7901  Systolic BP 409 735 329  Diastolic BP 78 77 42  Wt. (Lbs) 164 166  167.6  BMI 27.29 27.62 27.89    Physical Exam Vitals reviewed.  Constitutional:      Appearance: Normal appearance. She is normal weight.  Neck:     Vascular: No carotid bruit.  Cardiovascular:     Rate and Rhythm: Normal rate and regular rhythm.     Pulses: Normal pulses.     Heart sounds: Normal heart sounds.  Pulmonary:     Effort: Pulmonary effort is normal. No respiratory distress.     Breath sounds: Normal breath sounds.  Abdominal:     General: Abdomen is flat. Bowel sounds are normal.     Palpations: Abdomen is soft.     Tenderness: There is no abdominal tenderness.  Musculoskeletal:        General: Tenderness (rt hip posteriorly and laterally. ) present.  Neurological:     Mental Status: She is alert and oriented to person, place, and time.  Psychiatric:        Mood and Affect: Mood normal.        Behavior: Behavior normal.     Diabetic Foot Exam - Simple   Simple Foot Form Visual Inspection No deformities, no ulcerations, no other skin breakdown bilaterally: Yes Sensation Testing Intact to touch and monofilament testing bilaterally: Yes Pulse Check Posterior Tibialis and Dorsalis pulse intact bilaterally: Yes Comments      Lab Results  Component Value Date   WBC 12.3 (H) 05/09/2020   HGB 15.5 05/09/2020   HCT 46.7 (H) 05/09/2020   PLT 322 05/09/2020   GLUCOSE 155 (H) 05/09/2020   CHOL 167 05/09/2020   TRIG 268 (H) 05/09/2020   HDL 41 05/09/2020   LDLCALC 82 05/09/2020   ALT 21 05/09/2020   AST 21 05/09/2020   NA 139 05/09/2020   K 3.7 05/09/2020  CL 96 05/09/2020   CREATININE 0.95 05/09/2020   BUN 33 (H) 05/09/2020   CO2 28 05/09/2020   TSH 0.619 12/04/2019   INR 1.0 01/31/2019   HGBA1C 7.4 (H) 05/09/2020   MICROALBUR 30 12/04/2019      Assessment & Plan:   1. Hypertensive heart disease with CHF. Well controlled.  No changes to medicines.  Continue to work on eating a healthy diet and exercise.  Labs drawn today.  - Comprehensive  metabolic panel  2. Mixed hyperlipidemia Well controlled.  No medication changes recommended. Continue healthy diet and exercise.  Labs drawn today: - Lipid panel  3. Type 2 diabetes mellitus with hypertriglyceridemia (HCC) Control: greatly improved! Recommend check sugars fasting daily. Recommend check feet daily. Recommend annual eye exams. Medicines: Change trulicity to 3 mg once weekly in the EVENING. Continue to work on eating a healthy diet and exercise.  Labs drawn today.   - CBC with Differential/Platelet - Hemoglobin A1c  4. OSA - continue cpap.  5. Right hip pain Right hip xray.   6. Severe recurrent major depression Start on abilify 5 mg once daily in am. Continue effexor xr 75 mg once daily in am.   Meds ordered this encounter  Medications   DISCONTD: ALPRAZolam (XANAX) 0.5 MG tablet    Sig: Take 1 tablet (0.5 mg total) by mouth 3 (three) times daily.    Dispense:  90 tablet    Refill:  1    Not to exceed 3 additional fills before 05/30/2020.    Orders Placed This Encounter  Procedures   CBC with Differential/Platelet   Comprehensive metabolic panel   Hemoglobin A1c   Lipid panel    I spent 30 minutes dedicated to the care of this patient on the date of this encounter to include face-to-face time with the patient, as well as: full review of chart.  Follow-up: Return in about 6 weeks (around 09/28/2020).  An After Visit Summary was printed and given to the patient.  [name], MD [facility] [phone]

## 2020-08-17 NOTE — Patient Instructions (Addendum)
Change trulicity to 3 mg once weekly in the EVENING. I am going to review charts.

## 2020-08-18 LAB — COMPREHENSIVE METABOLIC PANEL
ALT: 16 IU/L (ref 0–32)
AST: 16 IU/L (ref 0–40)
Albumin/Globulin Ratio: 2.1 (ref 1.2–2.2)
Albumin: 4.4 g/dL (ref 3.7–4.7)
Alkaline Phosphatase: 59 IU/L (ref 44–121)
BUN/Creatinine Ratio: 25 (ref 12–28)
BUN: 17 mg/dL (ref 8–27)
Bilirubin Total: 0.2 mg/dL (ref 0.0–1.2)
CO2: 26 mmol/L (ref 20–29)
Calcium: 9.3 mg/dL (ref 8.7–10.3)
Chloride: 104 mmol/L (ref 96–106)
Creatinine, Ser: 0.67 mg/dL (ref 0.57–1.00)
GFR calc Af Amer: 102 mL/min/{1.73_m2} (ref >59)
GFR calc non Af Amer: 89 mL/min/{1.73_m2} (ref >59)
Globulin, Total: 2.1 g/dL (ref 1.5–4.5)
Glucose: 111 mg/dL — ABNORMAL HIGH (ref 65–99)
Potassium: 3.9 mmol/L (ref 3.5–5.2)
Sodium: 145 mmol/L — ABNORMAL HIGH (ref 134–144)
Total Protein: 6.5 g/dL (ref 6.0–8.5)

## 2020-08-18 LAB — CBC WITH DIFFERENTIAL/PLATELET
Basophils Absolute: 0.1 10*3/uL (ref 0.0–0.2)
Basos: 1 %
EOS (ABSOLUTE): 0.1 10*3/uL (ref 0.0–0.4)
Eos: 1 %
Hematocrit: 43.9 % (ref 34.0–46.6)
Hemoglobin: 14.8 g/dL (ref 11.1–15.9)
Immature Grans (Abs): 0.1 10*3/uL (ref 0.0–0.1)
Immature Granulocytes: 1 %
Lymphocytes Absolute: 2.2 10*3/uL (ref 0.7–3.1)
Lymphs: 22 %
MCH: 29.5 pg (ref 26.6–33.0)
MCHC: 33.7 g/dL (ref 31.5–35.7)
MCV: 88 fL (ref 79–97)
Monocytes Absolute: 0.7 10*3/uL (ref 0.1–0.9)
Monocytes: 7 %
Neutrophils Absolute: 6.9 10*3/uL (ref 1.4–7.0)
Neutrophils: 68 %
Platelets: 281 10*3/uL (ref 150–450)
RBC: 5.01 x10E6/uL (ref 3.77–5.28)
RDW: 11.9 % (ref 11.7–15.4)
WBC: 10 10*3/uL (ref 3.4–10.8)

## 2020-08-18 LAB — LIPID PANEL
Chol/HDL Ratio: 3.9 ratio (ref 0.0–4.4)
Cholesterol, Total: 160 mg/dL (ref 100–199)
HDL: 41 mg/dL (ref >39)
LDL Chol Calc (NIH): 93 mg/dL (ref 0–99)
Triglycerides: 150 mg/dL — ABNORMAL HIGH (ref 0–149)
VLDL Cholesterol Cal: 26 mg/dL (ref 5–40)

## 2020-08-18 LAB — HEMOGLOBIN A1C
Est. average glucose Bld gHb Est-mCnc: 143 mg/dL
Hgb A1c MFr Bld: 6.6 % — ABNORMAL HIGH (ref 4.8–5.6)

## 2020-08-18 LAB — CARDIOVASCULAR RISK ASSESSMENT

## 2020-08-22 ENCOUNTER — Encounter: Payer: Self-pay | Admitting: Family Medicine

## 2020-08-29 DIAGNOSIS — Z23 Encounter for immunization: Secondary | ICD-10-CM | POA: Diagnosis not present

## 2020-09-09 ENCOUNTER — Telehealth: Payer: Self-pay

## 2020-09-09 NOTE — Chronic Care Management (AMB) (Signed)
Chronic Care Management Pharmacy Assistant   Name: Sonya Small  MRN: [id number] DOB: May 27, 1951  Reason for Encounter: Medication Review  Patient Questions:  1.  Have you seen any other providers since your last visit? Yes, 08/17/2020- Dr [name] (PCP); 09/07/2020- Dr Ronnald Ramp (Podiatry).  2.  Any changes in your medicines or health? Yes, 08/17/2020- Aripiprazole 5 mg added and Albuterol inhaler discontinued.    PCP : [name], MD  Allergies:  No Known Allergies  Medications: Outpatient Encounter Medications as of 09/09/2020  Medication Sig  . ALPRAZolam (XANAX) 0.5 MG tablet Take 1 tablet (0.5 mg total) by mouth 3 (three) times daily.  . ARIPiprazole (ABILIFY) 5 MG tablet Take 1 tablet (5 mg total) by mouth daily.  . Aspirin-Acetaminophen (GOODYS BODY PAIN PO) Take 1 packet by mouth daily.  . clopidogrel (PLAVIX) 75 MG tablet TAKE ONE (1) TABLET BY MOUTH ONCE DAILY  . diclofenac Sodium (VOLTAREN) 1 % GEL   . fenofibrate 160 MG tablet TAKE ONE (1) TABLET ONCE DAILY  . fluticasone (FLONASE) 50 MCG/ACT nasal spray PLACE ONE SPRAY INTO BOTH NOSTRILS DAILY  . Fluticasone-Salmeterol,sensor, (AIRDUO DIGIHALER) 113-14 MCG/ACT AEPB Inhale 1 puff into the lungs in the morning and at bedtime.  . furosemide (LASIX) 20 MG tablet Take 1 tablet (20 mg total) by mouth daily.  [name] glucose blood (ONETOUCH VERIO) test strip USE DAILY TO CHECK YOUR BLOOD SUGAR (E11.69)  . icosapent Ethyl (VASCEPA) 1 g capsule TAKE 2 CAPSULES TWICE A DAY WITH FOOD  . lamoTRIgine (LAMICTAL) 25 MG tablet TAKE TWO TABLETS BY MOUTH EVERYDAY AT BEDTIME  . lansoprazole (PREVACID) 30 MG capsule TAKE ONE CAPSULE BY MOUTH EVERY MORNING  . lisinopril-hydrochlorothiazide (ZESTORETIC) 10-12.5 MG tablet Take 1 tablet by mouth daily.  . nitroGLYCERIN (NITROSTAT) 0.4 MG SL tablet Place 1 tablet (0.4 mg total) under the tongue every 5 (five) minutes as needed.  [name] VERIO test strip USE DAILY TO CHECK YOUR BLOOD SUGAR (E11.69)   . oxyCODONE-acetaminophen (PERCOCET) 10-325 MG tablet Take 1 tablet by mouth every 6 (six) hours as needed.  . rosuvastatin (CRESTOR) 20 MG tablet TAKE ONE (1) TABLET BY MOUTH ONCE DAILY  . TRUEplus Lancets 30G MISC 1 each by Does not apply route 2 (two) times daily.  . TRULICITY 3 PY/1.9JK SOPN Inject 3 mg as directed once a week.  . valACYclovir (VALTREX) 1000 MG tablet TAKE TWO TABLETS BY MOUTH twice A DAY FOR ONE DAY as needed  . venlafaxine XR (EFFEXOR-XR) 75 MG 24 hr capsule Take 1 capsule (75 mg total) by mouth every morning.   No facility-administered encounter medications on file as of 09/09/2020.    Current Diagnosis: Patient Active Problem List   Diagnosis Date Noted  . Severe recurrent major depression without psychotic features ([place]) 05/09/2020  . Atypical pigmented skin lesion 05/09/2020  . Dizziness 02/07/2020  . Dermatitis 01/12/2020  . Mixed stress and urge urinary incontinence 12/25/2019  . Localized edema 12/18/2019  . Dry mouth 12/18/2019  . Diabetic polyneuropathy associated with type 2 diabetes mellitus ([place]) 12/16/2019  . Weakness 12/07/2019  . Ingrown toenail of both feet 12/07/2019  . Moderate recurrent major depression ([place]) 12/03/2019  . Cigarette nicotine dependence with nicotine-induced disorder 12/03/2019  . Type 2 diabetes mellitus with hypertriglyceridemia ([place]) 12/03/2019  . Hypertensive heart disease   . Oral thrush 10/28/2015  . Pituitary adenoma ([place]) 02/22/2015  . Cerebral infarction due to thrombosis of left middle cerebral artery ([place]) 08/23/2014  . HLD (  hyperlipidemia) 08/23/2014  . Essential hypertension 08/23/2014  . PVD (peripheral vascular disease) ([place]) 08/23/2014  . Former smoker 08/23/2014  . COPD (chronic obstructive pulmonary disease) ([place]) 07/13/2014  . OSA (obstructive sleep apnea) 07/13/2014  . Chronic diastolic heart failure ([place]) 07/13/2014  . Stroke ([place]) 07/12/2014  . HTN (hypertension) 07/12/2014  . Peripheral vascular  disease, unspecified ([place]) 02/17/2013  . Aftercare following surgery of the circulatory system, [place] 02/17/2013  . Chest pain 10/16/2012  . Coronary artery disease   . PAD (peripheral artery disease) ([place])   . Hyperlipidemia   . Tobacco abuse   . History of myocardial infarction 01/22/2010   Reviewed chart for medication changes ahead of medication coordination call.  OVs, Consults-08/17/2020- Dr [name] (PCP); 09/07/2020- Dr Ronnald Ramp (Podiatry) since last care coordination call/Pharmacist visit.  Medication changes indicated: 08/17/2020- Aripiprazole 5 mg added and Albuterol inhaler discontinued.     BP Readings from Last 3 Encounters:  08/17/20 110/78  06/22/20 133/77  05/26/20 (!) 140/42    Lab Results  Component Value Date   HGBA1C 6.6 (H) 08/17/2020     Patient obtains medications through Adherence Packaging  90 Days   Last adherence delivery included: NO MEDICATIONS needed during last adherence call on 08/16/2020.  Patient declined the following medications last month:  Flonase NS-1 spray each nostril daily. Due to receiving 3 month supply on 07/21/20. Lisinopril-hctz 10-12.5 mg- 1 tabletdaily (Breakfast)  Due to receiving 3 month supply on 07/21/20 Clopidogrel 75 mg- 1 tabletdaily (breakfast)  Due to receiving 3 month supply on 07/21/20 Fenofibrate 160 mg- 1 tabletdaily (breakfast)  Due to receiving 3 month supply on 07/21/20 Lamotrigine25 mg2 tabs once daily (2 at bedtime)  Due to receiving 3 month supply on 07/21/20 Lansoprazole 30 mg daily (breakfast)  Due to receiving 3 month supply on 07/21/20 Rosuvastatin 20 mg daily (bedtime)  Due to receiving 3 month supply on 07/21/20 Vascepa 1 gm -2 caps bid (Breakfast &bedtime)  Due to receiving 3 month supply on 07/21/20 Venlafaxine XR 75 mg daily (Breakfast)  Due to receiving 3 month supply on 07/21/20 Valacyclovir1 gm- 2 tablet twice a day for one day as needed (vials) PRN use Airduo- 1 puff into lungs twice daily  Due to  receiving 3 month supply on 07/21/20 Ventolin hfa - 2 puffs into lungs every 4 hours as needed  PRN use Nitroglycerin sub 0.4 mg- I tablet under tongue every 5 minutes as needed(vials). PRN use Trulicity inj3 WU/9.8 ml- Inject 0.5 ml sq(once a week on Wednesday) due to receiving a 60 day supply 08/11/20 Tizanidine 4 mg due to not taking any longer OneTouch test strips due to receiving a refill from [facility] Aripiprazole 5 mg take 1 tablet with breakfast. Pharmacy processing delivery 07/21/20. Alprazolam 0.5 mg gets from [facility].  Patient is due for next adherence delivery on: 09/19/2020. Called patient and reviewed medications and coordinated delivery.  This delivery to include: Abilify 5 mg- 1 tablet daily (Vials) Airduo Inhaler 113- 14 mcg- 1 puff twice daily.  Short fill not needed.  Coordinated future fill for Trulicity to be delivered 10/06/2020.   Patient declined the following medications: Flonase NS-1 spray each nostril daily. Due to receiving 3 month supply on 07/21/20. Lisinopril-hctz 10-12.5 mg- 1 tabletdaily (Breakfast)  Due to receiving 3 month supply on 07/21/20 Clopidogrel 75 mg- 1 tabletdaily (breakfast)  Due to receiving 3 month supply on 07/21/20 Fenofibrate 160 mg- 1 tabletdaily (breakfast)  Due to receiving 3 month supply on 07/21/20 Lamotrigine25 mg2 tabs  once daily (2 at bedtime)  Due to receiving 3 month supply on 07/21/20 Lansoprazole 30 mg daily (breakfast)  Due to receiving 3 month supply on 07/21/20 Rosuvastatin 20 mg daily (bedtime)  Due to receiving 3 month supply on 07/21/20 Vascepa 1 gm -2 caps bid (Breakfast &bedtime)  Due to receiving 3 month supply on 07/21/20 Venlafaxine XR 75 mg daily (Breakfast)  Due to receiving 3 month supply on 07/21/20 Valacyclovir1 gm- 2 tablet twice a day for one day as needed (vials) PRN use Ventolin hfa - 2 puffs into lungs every 4 hours as needed  PRN use Nitroglycerin sub 0.4 mg- I tablet under tongue every 5 minutes  as needed(vials). PRN use Trulicity inj3 YV/8.5 ml- Inject 0.5 ml sq(once a week on Wednesday) due to receiving a 60 day supply 08/11/20 Tizanidine 4 mg due to not taking any longer OneTouch test strips due to receiving a [facility]ill [facility] Alprazolam 0.5 mg gets [facility]- received a [facility] day from [facility] 08/17/2020.  Patient needs refills for Abilify.  Confirmed delivery date of 09/19/2020, advised patient that pharmacy will contact them the morning of delivery.  Follow-Up:  Care Coordination with Outside Provider, Coordination of Enhanced Pharmacy Services and Pharmacist Review  09/09/2020- Patient was recently given Lyrica from Dr Kaleen Mask that was [facility]. Patient was not sure if this was ready for fill because prior authorization was needed. Called [facility], Lyrica 50 mg was approved and they are getting prescription ready for patient pick up. Patient aware and very thankful for the help.   Patient is checking blood pressure, recent reading 09/09/2020- 113/76, 84- Pulse.  Patient is also checking blood sugars, recent fasting readings 137 and 110.  [name], CPP notified.  Pattricia Boss, [place] Pharmacist Assistant [phone]

## 2020-09-10 ENCOUNTER — Encounter: Payer: Self-pay | Admitting: Family Medicine

## 2020-09-13 ENCOUNTER — Other Ambulatory Visit: Payer: Self-pay

## 2020-09-13 DIAGNOSIS — F332 Major depressive disorder, recurrent severe without psychotic features: Secondary | ICD-10-CM

## 2020-09-13 MED ORDER — ARIPIPRAZOLE 5 MG PO TABS: 5.0000 mg | ORAL_TABLET | Freq: Every day | ORAL | 0 refills | Status: DC

## 2020-09-15 ENCOUNTER — Other Ambulatory Visit: Payer: Self-pay

## 2020-09-15 ENCOUNTER — Telehealth: Payer: Self-pay

## 2020-09-15 DIAGNOSIS — F332 Major depressive disorder, recurrent severe without psychotic features: Secondary | ICD-10-CM

## 2020-09-15 MED ORDER — ARIPIPRAZOLE 5 MG PO TABS: 5.0000 mg | ORAL_TABLET | Freq: Every day | ORAL | 0 refills | Status: DC

## 2020-09-15 NOTE — Chronic Care Management (AMB) (Signed)
Chronic Care Management Pharmacy Assistant   Name: Sonya Small  MRN: [id number] DOB: 02-23-1951  Reason for Encounter: Medication Review  PCP : [name], MD   09/15/2020- Patient called regarding Airduo inhaler that is not going to be covered by insurance. Spoke with patient she has 1 inhaler in her purse and 1 inhaler on her tablet by her bed. Patient also mentioned that she has #3 Albuterol Sulfate inhalers on hand and #1 Breo Elliptical on hand. Patient also mentioned that she has 5 bottles of Nasal Sprays on hand also. Patient will call Pulmonology to follow up on a change of medication.   Allergies:  No Known Allergies  Medications: Outpatient Encounter Medications as of 09/15/2020  Medication Sig  . ALPRAZolam (XANAX) 0.5 MG tablet Take 1 tablet (0.5 mg total) by mouth 3 (three) times daily.  . ARIPiprazole (ABILIFY) 5 MG tablet Take 1 tablet (5 mg total) by mouth daily.  . Aspirin-Acetaminophen (GOODYS BODY PAIN PO) Take 1 packet by mouth daily.  . clopidogrel (PLAVIX) 75 MG tablet TAKE ONE (1) TABLET BY MOUTH ONCE DAILY  . diclofenac Sodium (VOLTAREN) 1 % GEL   . fenofibrate 160 MG tablet TAKE ONE (1) TABLET ONCE DAILY  . fluticasone (FLONASE) 50 MCG/ACT nasal spray PLACE ONE SPRAY INTO BOTH NOSTRILS DAILY  . Fluticasone-Salmeterol,sensor, (AIRDUO DIGIHALER) 113-14 MCG/ACT AEPB Inhale 1 puff into the lungs in the morning and at bedtime.  . furosemide (LASIX) 20 MG tablet Take 1 tablet (20 mg total) by mouth daily.  [name] glucose blood (ONETOUCH VERIO) test strip USE DAILY TO CHECK YOUR BLOOD SUGAR (E11.69)  . icosapent Ethyl (VASCEPA) 1 g capsule TAKE 2 CAPSULES TWICE A DAY WITH FOOD  . lamoTRIgine (LAMICTAL) 25 MG tablet TAKE TWO TABLETS BY MOUTH EVERYDAY AT BEDTIME  . lansoprazole (PREVACID) 30 MG capsule TAKE ONE CAPSULE BY MOUTH EVERY MORNING  . lisinopril-hydrochlorothiazide (ZESTORETIC) 10-12.5 MG tablet Take 1 tablet by mouth daily.  . nitroGLYCERIN (NITROSTAT) 0.4  MG SL tablet Place 1 tablet (0.4 mg total) under the tongue every 5 (five) minutes as needed.  [name] VERIO test strip USE DAILY TO CHECK YOUR BLOOD SUGAR (E11.69)  . oxyCODONE-acetaminophen (PERCOCET) 10-325 MG tablet Take 1 tablet by mouth every 6 (six) hours as needed.  . rosuvastatin (CRESTOR) 20 MG tablet TAKE ONE (1) TABLET BY MOUTH ONCE DAILY  . TRUEplus Lancets 30G MISC 1 each by Does not apply route 2 (two) times daily.  . TRULICITY 3 0000000 SOPN Inject 3 mg as directed once a week.  . valACYclovir (VALTREX) 1000 MG tablet TAKE TWO TABLETS BY MOUTH twice A DAY FOR ONE DAY as needed  . venlafaxine XR (EFFEXOR-XR) 75 MG 24 hr capsule Take 1 capsule (75 mg total) by mouth every morning.   No facility-administered encounter medications on file as of 09/15/2020.    Current Diagnosis: Patient Active Problem List   Diagnosis Date Noted  . Severe recurrent major depression without psychotic features ([place]) 05/09/2020  . Atypical pigmented skin lesion 05/09/2020  . Dizziness 02/07/2020  . Dermatitis 01/12/2020  . Mixed stress and urge urinary incontinence 12/25/2019  . Localized edema 12/18/2019  . Dry mouth 12/18/2019  . Diabetic polyneuropathy associated with type 2 diabetes mellitus ([place]) 12/16/2019  . Weakness 12/07/2019  . Ingrown toenail of both feet 12/07/2019  . Moderate recurrent major depression ([place]) 12/03/2019  . Cigarette nicotine dependence with nicotine-induced disorder 12/03/2019  . Type 2 diabetes mellitus with hypertriglyceridemia ([place]) 12/03/2019  .  Hypertensive heart disease   . Oral thrush 10/28/2015  . Pituitary adenoma ([place]) 02/22/2015  . Cerebral infarction due to thrombosis of left middle cerebral artery ([place]) 08/23/2014  . HLD (hyperlipidemia) 08/23/2014  . Essential hypertension 08/23/2014  . PVD (peripheral vascular disease) ([place]) 08/23/2014  . Former smoker 08/23/2014  . COPD (chronic obstructive pulmonary disease) ([place]) 07/13/2014  . OSA  (obstructive sleep apnea) 07/13/2014  . Chronic diastolic heart failure ([place]) 07/13/2014  . Stroke ([place]) 07/12/2014  . HTN (hypertension) 07/12/2014  . Peripheral vascular disease, unspecified ([place]) 02/17/2013  . Aftercare following surgery of the circulatory system, [place] 02/17/2013  . Chest pain 10/16/2012  . Coronary artery disease   . PAD (peripheral artery disease) ([place])   . Hyperlipidemia   . Tobacco abuse   . History of myocardial infarction 01/22/2010     Follow-Up:  Pharmacist Review  [name], CPP notified  Pattricia Boss, [place] Pharmacist Assistant [phone]

## 2020-09-20 ENCOUNTER — Telehealth: Payer: Self-pay

## 2020-09-20 NOTE — Chronic Care Management (AMB) (Signed)
Chronic Care Management Pharmacy Assistant   Name: Sonya Small  MRN: [id number] DOB: [birth year]  Reason for Encounter: Medication Review   Patient called regarding her recent medication delivery. States that she only received #30 Abilify. It was explained that we only had enough to fill 30 days and her doctor was contacted. She also inquired about the AirDuo. Informed her that insurance would not cover and since she is not yet out it may be best for her to obtain more samples until her doctor can decide if he wants to keep her on that or switch. Explained it may be possible to apply for patient assistance from the manufacturer. Patient verbalized understanding.   PCP : [name], MD  Allergies:  No Known Allergies  Medications: Outpatient Encounter Medications as of 09/20/2020  Medication Sig  . ALPRAZolam (XANAX) 0.5 MG tablet Take 1 tablet (0.5 mg total) by mouth 3 (three) times daily.  . ARIPiprazole (ABILIFY) 5 MG tablet Take 1 tablet (5 mg total) by mouth daily.  . Aspirin-Acetaminophen (GOODYS BODY PAIN PO) Take 1 packet by mouth daily.  . clopidogrel (PLAVIX) 75 MG tablet TAKE ONE (1) TABLET BY MOUTH ONCE DAILY  . diclofenac Sodium (VOLTAREN) 1 % GEL   . fenofibrate 160 MG tablet TAKE ONE (1) TABLET ONCE DAILY  . fluticasone (FLONASE) 50 MCG/ACT nasal spray PLACE ONE SPRAY INTO BOTH NOSTRILS DAILY  . Fluticasone-Salmeterol,sensor, (AIRDUO DIGIHALER) 113-14 MCG/ACT AEPB Inhale 1 puff into the lungs in the morning and at bedtime.  . furosemide (LASIX) 20 MG tablet Take 1 tablet (20 mg total) by mouth daily.  [name] glucose blood (ONETOUCH VERIO) test strip USE DAILY TO CHECK YOUR BLOOD SUGAR (E11.69)  . icosapent Ethyl (VASCEPA) 1 g capsule TAKE 2 CAPSULES TWICE A DAY WITH FOOD  . lamoTRIgine (LAMICTAL) 25 MG tablet TAKE TWO TABLETS BY MOUTH EVERYDAY AT BEDTIME  . lansoprazole (PREVACID) 30 MG capsule TAKE ONE CAPSULE BY MOUTH EVERY MORNING  . lisinopril-hydrochlorothiazide  (ZESTORETIC) 10-12.5 MG tablet Take 1 tablet by mouth daily.  . nitroGLYCERIN (NITROSTAT) 0.4 MG SL tablet Place 1 tablet (0.4 mg total) under the tongue every 5 (five) minutes as needed.  [name] VERIO test strip USE DAILY TO CHECK YOUR BLOOD SUGAR (E11.69)  . oxyCODONE-acetaminophen (PERCOCET) 10-325 MG tablet Take 1 tablet by mouth every 6 (six) hours as needed.  . rosuvastatin (CRESTOR) 20 MG tablet TAKE ONE (1) TABLET BY MOUTH ONCE DAILY  . TRUEplus Lancets 30G MISC 1 each by Does not apply route 2 (two) times daily.  . TRULICITY 3 0000000 SOPN Inject 3 mg as directed once a week.  . valACYclovir (VALTREX) 1000 MG tablet TAKE TWO TABLETS BY MOUTH twice A DAY FOR ONE DAY as needed  . venlafaxine XR (EFFEXOR-XR) 75 MG 24 hr capsule Take 1 capsule (75 mg total) by mouth every morning.   No facility-administered encounter medications on file as of 09/20/2020.    Current Diagnosis: Patient Active Problem List   Diagnosis Date Noted  . Severe recurrent major depression without psychotic features ([place]) 05/09/2020  . Atypical pigmented skin lesion 05/09/2020  . Dizziness 02/07/2020  . Dermatitis 01/12/2020  . Mixed stress and urge urinary incontinence 12/25/2019  . Localized edema 12/18/2019  . Dry mouth 12/18/2019  . Diabetic polyneuropathy associated with type 2 diabetes mellitus ([place]) 12/16/2019  . Weakness 12/07/2019  . Ingrown toenail of both feet 12/07/2019  . Moderate recurrent major depression ([place]) 12/03/2019  . Cigarette nicotine dependence  with nicotine-induced disorder 12/03/2019  . Type 2 diabetes mellitus with hypertriglyceridemia ([facility]) 12/03/2019  . Hypertensive heart disease   . Oral thrush 10/28/2015  . Pituitary adenoma ([facility]) 02/22/2015  . Cerebral infarction due to thrombosis of left middle cerebral artery ([facility]) 08/23/2014  . HLD (hyperlipidemia) 08/23/2014  . Essential hypertension 08/23/2014  . PVD (peripheral vascular disease) ([facility]) 08/23/2014  . Former  smoker 08/23/2014  . COPD (chronic obstructive pulmonary disease) ([facility]) 07/13/2014  . OSA (obstructive sleep apnea) 07/13/2014  . Chronic diastolic heart failure ([facility]) 07/13/2014  . Stroke ([facility]) 07/12/2014  . HTN (hypertension) 07/12/2014  . Peripheral vascular disease, unspecified ([facility]) 02/17/2013  . Aftercare following surgery of the circulatory system, [place] 02/17/2013  . Chest pain 10/16/2012  . Coronary artery disease   . PAD (peripheral artery disease) ([facility])   . Hyperlipidemia   . Tobacco abuse   . History of myocardial infarction 01/22/2010    Follow-Up:  Pharmacist Review   Levada Dy. Manson Passey, CPP notified  Jomarie Longs, [facility] Clinical Pharmacy Assistant [phone]

## 2020-09-28 ENCOUNTER — Other Ambulatory Visit: Payer: Self-pay | Admitting: Family Medicine

## 2020-09-29 ENCOUNTER — Other Ambulatory Visit: Payer: Self-pay

## 2020-09-29 MED ORDER — ONETOUCH VERIO VI STRP: ORAL_STRIP | 3 refills | Status: DC

## 2020-10-03 ENCOUNTER — Encounter: Payer: Self-pay | Admitting: Family Medicine

## 2020-10-03 ENCOUNTER — Other Ambulatory Visit: Payer: Self-pay

## 2020-10-03 ENCOUNTER — Ambulatory Visit (INDEPENDENT_AMBULATORY_CARE_PROVIDER_SITE_OTHER): Payer: Medicare Other | Admitting: Family Medicine

## 2020-10-03 VITALS — BP 110/68 | HR 90 | Temp 97.3°F | Ht 65.0 in | Wt 167.0 lb

## 2020-10-03 DIAGNOSIS — I110 Hypertensive heart disease with heart failure: Secondary | ICD-10-CM

## 2020-10-03 DIAGNOSIS — E782 Mixed hyperlipidemia: Secondary | ICD-10-CM | POA: Diagnosis not present

## 2020-10-03 DIAGNOSIS — R202 Paresthesia of skin: Secondary | ICD-10-CM

## 2020-10-03 DIAGNOSIS — E1169 Type 2 diabetes mellitus with other specified complication: Secondary | ICD-10-CM | POA: Diagnosis not present

## 2020-10-03 DIAGNOSIS — I5042 Chronic combined systolic (congestive) and diastolic (congestive) heart failure: Secondary | ICD-10-CM

## 2020-10-03 DIAGNOSIS — E781 Pure hyperglyceridemia: Secondary | ICD-10-CM

## 2020-10-03 DIAGNOSIS — R600 Localized edema: Secondary | ICD-10-CM | POA: Diagnosis not present

## 2020-10-03 DIAGNOSIS — F332 Major depressive disorder, recurrent severe without psychotic features: Secondary | ICD-10-CM

## 2020-10-03 DIAGNOSIS — R5383 Other fatigue: Secondary | ICD-10-CM

## 2020-10-03 MED ORDER — ALPRAZOLAM 0.5 MG PO TABS: 0.5000 mg | ORAL_TABLET | Freq: Three times a day (TID) | ORAL | 0 refills | Status: DC

## 2020-10-03 MED ORDER — ONETOUCH VERIO VI STRP: ORAL_STRIP | 3 refills | Status: DC

## 2020-10-03 MED ORDER — ARIPIPRAZOLE 10 MG PO TABS: 10.0000 mg | ORAL_TABLET | Freq: Every day | ORAL | 2 refills | Status: DC

## 2020-10-03 NOTE — Progress Notes (Signed)
Subjective:  Patient ID: Sonya Small, female    DOB: [birth year]  Age: 70 y.o. MRN: [id number]  Chief Complaint  Patient presents with  . Diabetes  . Hypertension    HPI Diabetes- Low sugar diet. Not exercising.Taking Trulicity 3 mg weekly. Sugars 100-140. Hypertension-lisinopril-HCTZ 10/12.5 one daily. Bps 110--138/70-80s. HR 80-90s Daytime somnolence: Falls asleep during the day. Has known OSA but is intolerant to CPAP mask. Depression/Anxiety: taking abilify 5 mg once daily, effexor xr 75 mg one in am, and lamictal 25 mg 2 at pm.  Decreased xanax 0.5 tid prn.  Current Outpatient Medications on File Prior to Visit  Medication Sig Dispense Refill  . Aspirin-Acetaminophen (GOODYS BODY PAIN PO) Take 1 packet by mouth daily.    . clopidogrel (PLAVIX) 75 MG tablet TAKE ONE (1) TABLET BY MOUTH ONCE DAILY 90 tablet 1  . diclofenac Sodium (VOLTAREN) 1 % GEL     . fenofibrate 160 MG tablet TAKE ONE (1) TABLET ONCE DAILY 90 tablet 1  . fluticasone (FLONASE) 50 MCG/ACT nasal spray PLACE ONE SPRAY INTO BOTH NOSTRILS DAILY 16 g 1  . Fluticasone-Salmeterol,sensor, (AIRDUO DIGIHALER) 113-14 MCG/ACT AEPB Inhale 1 puff into the lungs in the morning and at bedtime.    [name] icosapent Ethyl (VASCEPA) 1 g capsule TAKE 2 CAPSULES TWICE A DAY WITH FOOD 360 capsule 1  . lamoTRIgine (LAMICTAL) 25 MG tablet TAKE TWO TABLETS BY MOUTH EVERYDAY AT BEDTIME 180 tablet 1  . lansoprazole (PREVACID) 30 MG capsule TAKE ONE CAPSULE BY MOUTH EVERY MORNING 90 capsule 1  . lisinopril-hydrochlorothiazide (ZESTORETIC) 10-12.5 MG tablet Take 1 tablet by mouth daily.    . nitroGLYCERIN (NITROSTAT) 0.4 MG SL tablet Place 1 tablet (0.4 mg total) under the tongue every 5 (five) minutes as needed. 25 tablet 1  . ONETOUCH VERIO test strip USE DAILY TO CHECK YOUR BLOOD SUGAR (E11.69) Test 1 x daily 100 each 3  . oxyCODONE-acetaminophen (PERCOCET) 10-325 MG tablet Take 1 tablet by mouth every 6 (six) hours as needed.    . rosuvastatin  (CRESTOR) 20 MG tablet TAKE ONE (1) TABLET BY MOUTH ONCE DAILY 90 tablet 1  . TRUEplus Lancets 30G MISC 1 each by Does not apply route 2 (two) times daily. 100 each 2  . TRULICITY 3 0000000 SOPN Inject 3 mg as directed once a week. 2 mL 2  . valACYclovir (VALTREX) 1000 MG tablet TAKE TWO TABLETS BY MOUTH twice A DAY FOR ONE DAY as needed 10 tablet 2  . venlafaxine XR (EFFEXOR-XR) 75 MG 24 hr capsule Take 1 capsule (75 mg total) by mouth every morning. 90 capsule 1   No current facility-administered medications on file prior to visit.   Past Medical History:  Diagnosis Date  . Anxiety   . CAD (coronary artery disease)    a. NSTEMI 11/2008 s/p DES to LCx (3.0x12 Xience); b. NSTEMI 01/2010 secondary to thrombotic RCA lesion (non-obstructive)-->med rx (integrilin x 24 hrs + plavix); c. 09/2012 negative Myoview.  . Chronic diastolic CHF (congestive heart failure) ([place])    a. 06/2014 Echo: EF 55-60%, no rwma, Gr1 DD, mild AI.  [name] Depression   . Dizziness   . Drug induced constipation   . Generalized hyperhidrosis   . GERD (gastroesophageal reflux disease)   . Headache   . Hyperlipidemia   . Hypertensive heart disease   . Lumbar disc disease   . Metabolic encephalopathy   . Mixed hyperlipidemia   . Myocardial infarction ([place])   . Obstructive  sleep apnea   . OP (osteoporosis)   . Osteoarthritis   . Osteoporosis   . Other malaise   . Overweight(278.02)   . PAD (peripheral artery disease) ([place])    a. Emboli to R foot 2010 from partially occlusive lesion in R EIA, s/p stenting. - followed by Dr. Donnetta Hutching;  b. 10/2015 ABIs: R 1.03, L 0.97.  [name] Restless leg   . Sleep apnea   . Stroke ([place])   . TIA (transient ischemic attack)   . Tobacco abuse   . Urge incontinence    Past Surgical History:  Procedure Laterality Date  . EYE SURGERY     at age 40  . hysterectomy -age 78    . ILIAC ARTERY STENT     RIGHT ILIAC STENT  . KNEE ARTHROSCOPY    . LUMBAR LAMINECTOMY    . TUBAL LIGATION       Family History  Problem Relation Age of Onset  . Alzheimer's disease Mother   . Cancer Brother   . Heart disease Other        Grandfather  . Hepatitis C Brother   . Diabetes Brother   . Thyroid disease Brother   . Hypertension Brother   . Depression Brother    Social History   Socioeconomic History  . Marital status: Divorced    Spouse name: Not on file  . Number of children: 3  . Years of education: 10 th  . Highest education level: Not on file  Occupational History  . Occupation: DISABLED    Employer: UNEMPLOYED  Tobacco Use  . Smoking status: Current Every Day Smoker    Packs/day: 0.50    Types: Cigarettes    Last attempt to quit: 11/20/2012    Years since quitting: 7.8  . Smokeless tobacco: Never Used  Substance and Sexual Activity  . Alcohol use: No    Alcohol/week: 0.0 standard drinks  . Drug use: No  . Sexual activity: Never  Other Topics Concern  . Not on file  Social History Narrative   Patient is single with 3 children, 1 deceased.   Patient is right handed.   Patient has 10 th grade education.   Patient drinks 5 or more cups daily.   Social Determinants of Health   Financial Resource Strain: Not on file  Food Insecurity: No Food Insecurity  . Worried About Charity fundraiser in the Last Year: Never true  . Ran Out of Food in the Last Year: Never true  Transportation Needs: No Transportation Needs  . Lack of Transportation (Medical): No  . Lack of Transportation (Non-Medical): No  Physical Activity: Not on file  Stress: Stress Concern Present  . Feeling of Stress : Rather much  Social Connections: Not on file    Review of Systems  Constitutional: Negative for chills, fatigue and fever.  HENT: Negative for congestion, ear pain, postnasal drip, rhinorrhea, sinus pressure, sinus pain and sore throat.   Respiratory: Negative for cough and shortness of breath.   Cardiovascular: Positive for leg swelling. Negative for chest pain.  Gastrointestinal:  Negative for diarrhea and nausea.  Endocrine: Positive for polyuria. Negative for polydipsia and polyphagia.  Musculoskeletal: Positive for back pain.  Neurological: Negative for dizziness and headaches.     Objective:  BP 110/68   Pulse 90   Temp (!) 97.3 F (36.3 C)   Ht 5\' 5"  (1.651 m)   Wt 167 lb (75.8 kg)   SpO2 96%   BMI 27.79  kg/m   BP/Weight 10/03/2020 08/17/2020 99991111  Systolic BP [id number]  Diastolic BP 68 78 77  Wt. (Lbs) 167 164 166  BMI 27.79 27.29 27.62    Physical Exam Vitals reviewed.  Constitutional:      Appearance: Normal appearance.  Neck:     Vascular: No carotid bruit.  Cardiovascular:     Rate and Rhythm: Normal rate and regular rhythm.     Pulses: Normal pulses.     Heart sounds: Normal heart sounds.  Pulmonary:     Effort: Pulmonary effort is normal. No respiratory distress.     Breath sounds: Normal breath sounds.  Abdominal:     General: Abdomen is flat. Bowel sounds are normal.     Palpations: Abdomen is soft.     Tenderness: There is no abdominal tenderness.  Neurological:     Mental Status: She is alert and oriented to person, place, and time.  Psychiatric:        Mood and Affect: Mood normal.        Behavior: Behavior normal.     Lab Results  Component Value Date   WBC 10.0 08/17/2020   HGB 14.8 08/17/2020   HCT 43.9 08/17/2020   PLT 281 08/17/2020   GLUCOSE 111 (H) 08/17/2020   CHOL 160 08/17/2020   TRIG 150 (H) 08/17/2020   HDL 41 08/17/2020   LDLCALC 93 08/17/2020   ALT 16 08/17/2020   AST 16 08/17/2020   NA 145 (H) 08/17/2020   K 3.9 08/17/2020   CL 104 08/17/2020   CREATININE 0.67 08/17/2020   BUN 17 08/17/2020   CO2 26 08/17/2020   TSH 0.619 12/04/2019   INR 1.0 01/31/2019   HGBA1C 6.6 (H) 08/17/2020   MICROALBUR 30 12/04/2019      Assessment & Plan:   1. Mixed hyperlipidemia The current medical regimen is effective;  continue present plan and medications. Low fat diet.   2. Type 2 diabetes  mellitus with hypertriglyceridemia (HCC) Well controlled on current medications.  Low sugar diet.  - glucose blood (ONETOUCH VERIO) test strip; USE DAILY TO CHECK YOUR BLOOD SUGAR (E11.69)  Dispense: 100 each; Refill: 3  3. Hypertensive heart disease with chronic combined systolic and diastolic congestive heart failure ([place]) The current medical regimen is effective;  continue present plan and medications. Low salt diet.  4. Pedal edema Recommend compression stockings.  5. Severe episode of recurrent major depressive disorder, without psychotic features (HCC) Increase abilify 10 mg once daily. Continue effexor xr. Consider increase in lamictal.  - ALPRAZolam (XANAX) 0.5 MG tablet; Take 1 tablet (0.5 mg total) by mouth 3 (three) times daily.  Dispense: 90 tablet; Refill: 0  6. Other fatigue Likely due to untreated cpap.  - Methylmalonic acid, serum - B12 and Folate Panel - CBC with Differential/Platelet - Comprehensive metabolic panel  7. Paresthesia - Methylmalonic acid, serum - B12 and Folate Panel    Meds ordered this encounter  Medications  . ALPRAZolam (XANAX) 0.5 MG tablet    Sig: Take 1 tablet (0.5 mg total) by mouth 3 (three) times daily.    Dispense:  90 tablet    Refill:  0    Not to exceed 3 additional fills before 05/30/2020.  . glucose blood (ONETOUCH VERIO) test strip    Sig: USE DAILY TO CHECK YOUR BLOOD SUGAR (E11.69)    Dispense:  100 each    Refill:  3  . ARIPiprazole (ABILIFY) 10 MG tablet    Sig:  Take 1 tablet (10 mg total) by mouth daily.    Dispense:  30 tablet    Refill:  2    Orders Placed This Encounter  Procedures  . Methylmalonic acid, serum  . B12 and Folate Panel  . CBC with Differential/Platelet  . Comprehensive metabolic panel     Follow-up: Return in about 7 weeks (around 11/21/2020) for fasting.  An After Visit Summary was printed and given to the patient.  [name], MD [facility] [phone]

## 2020-10-03 NOTE — Patient Instructions (Signed)
Increase abilify to 10 mg once daily in am. No other medication changes.  Cognitive Behavioral Therapy Cognitive behavioral therapy (CBT) is a short-term, goal-oriented type of talk therapy. CBT can help you:  Identify patterns of thinking, feeling, and behaving that are causing you problems.  Decide how you want to think, feel, and respond to life events.  Set goals to change the beliefs and thoughts that cause you to act in ways that are not helpful for you.  Follow up on the changes that you make. What are the different types of CBT? The different types of CBT include:  Dialectical behavioral therapy (DBT). This approach is often used in group therapy, and it aids a person in managing behavior by focusing on: ? Things that cause problems to start (triggers). ? Methods of self-calming. ? Re-evaluating thinking processes.  Mindfulness-based cognitive therapy. This approach involves focusing your attention, meditating, and developing awareness of the present moment (mindfulness).  Rational emotive behavior therapy. This approach uses rational thought to reframe your thinking so it is less judgmental. Your therapist may directly challenge your thought processes.  Stress inoculation training. This approach involves planning ahead for stressful situations by practicing new thoughts and behaviors. This planning can help you avoid going back to old actions.  Acceptance and commitment therapy (ACT). This approach focuses on accepting yourself as you are and practicing mindfulness. It helps you understand what you would like to change and how you can set goals in that direction.   What conditions is CBT used to treat? CBT may help to treat:  Mental health conditions, including: ? Depression. ? Anxiety. ? Bipolar disorder. ? Eating disorders. ? Post-traumatic stress disorder (PTSD). ? Obsessive-compulsive disorder (OCD).  Insomnia and other sleep  disorders.  Pain.  Stress.  Coping with loss or grief.  Coping with a difficult medical diagnosis or illness.  Relationship problems.  Emotional distress or shock (trauma). How can CBT help me? CBT may:  Give you a chance to share your thoughts, feelings, problems, and fears in a safe space.  Help you focus on specific problems.  Give you homework that helps you put theory into practice. Homework may include keeping a journal or doing thinking exercises.  Help you become aware of your patterns of thinking, feeling, and behaving, and how those three patterns affect each other.  Change your thoughts so that you can change your behaviors.  Help you chose how you want to view the world.  Teach you planned coping skills and offer better ways to deal with stress and difficult situations. To make the most of CBT, make sure you:  Find a licensed therapist whom you trust.  Take an active part in your therapy and do the homework that you are given.  Are honest about your problems.  Avoid skipping your therapy sessions.   Summary  Cognitive behavioral therapy (CBT) is a short-term, goal-oriented type of talk therapy.  CBT can help you become aware of your patterns and the relationships among your thoughts, feelings, and behavior.  CBT may help mental health conditions and other problems. This information is not intended to replace advice given to you by your health care provider. Make sure you discuss any questions you have with your health care provider. Document Revised: 03/03/2020 Document Reviewed: 03/03/2020 Elsevier Patient Education  [place].

## 2020-10-04 ENCOUNTER — Telehealth: Payer: Self-pay

## 2020-10-04 NOTE — Telephone Encounter (Signed)
PA for Abilify submitted via covermymeds. "The patient currently has access to the requested medication and a Prior Authorization is not needed for the patient/medication."

## 2020-10-05 ENCOUNTER — Other Ambulatory Visit: Payer: Self-pay | Admitting: Physician Assistant

## 2020-10-06 ENCOUNTER — Telehealth: Payer: Self-pay

## 2020-10-06 ENCOUNTER — Other Ambulatory Visit: Payer: Self-pay

## 2020-10-06 ENCOUNTER — Other Ambulatory Visit: Payer: Self-pay | Admitting: Family Medicine

## 2020-10-06 DIAGNOSIS — R5383 Other fatigue: Secondary | ICD-10-CM

## 2020-10-06 MED ORDER — ONETOUCH VERIO VI STRP: ORAL_STRIP | 3 refills | Status: DC

## 2020-10-06 NOTE — Chronic Care Management (AMB) (Signed)
Chronic Care Management Pharmacy Assistant   Name: Sonya Small  MRN: [id number] DOB: 06/14/1951  Reason for Encounter: Medication Review    PCP : [name], MD  Allergies:  No Known Allergies  Medications: Outpatient Encounter Medications as of 10/06/2020  Medication Sig  . ALPRAZolam (XANAX) 0.5 MG tablet Take 1 tablet (0.5 mg total) by mouth 3 (three) times daily.  . ARIPiprazole (ABILIFY) 10 MG tablet Take 1 tablet (10 mg total) by mouth daily.  . Aspirin-Acetaminophen (GOODYS BODY PAIN PO) Take 1 packet by mouth daily.  . clopidogrel (PLAVIX) 75 MG tablet TAKE ONE (1) TABLET BY MOUTH ONCE DAILY  . diclofenac Sodium (VOLTAREN) 1 % GEL   . fenofibrate 160 MG tablet TAKE ONE (1) TABLET ONCE DAILY  . fluticasone (FLONASE) 50 MCG/ACT nasal spray PLACE ONE SPRAY INTO BOTH NOSTRILS DAILY  . Fluticasone-Salmeterol,sensor, (AIRDUO DIGIHALER) 113-14 MCG/ACT AEPB Inhale 1 puff into the lungs in the morning and at bedtime.  [name] glucose blood (ONETOUCH VERIO) test strip USE DAILY TO CHECK YOUR BLOOD SUGAR (E11.69)  . icosapent Ethyl (VASCEPA) 1 g capsule TAKE 2 CAPSULES TWICE A DAY WITH FOOD  . lamoTRIgine (LAMICTAL) 25 MG tablet TAKE TWO TABLETS BY MOUTH EVERYDAY AT BEDTIME  . lansoprazole (PREVACID) 30 MG capsule TAKE ONE CAPSULE BY MOUTH EVERY MORNING  . lisinopril-hydrochlorothiazide (ZESTORETIC) 10-12.5 MG tablet Take 1 tablet by mouth daily.  . nitroGLYCERIN (NITROSTAT) 0.4 MG SL tablet Place 1 tablet (0.4 mg total) under the tongue every 5 (five) minutes as needed.  [name] VERIO test strip USE DAILY TO CHECK YOUR BLOOD SUGAR (E11.69) Test 1 x daily  . oxyCODONE-acetaminophen (PERCOCET) 10-325 MG tablet Take 1 tablet by mouth every 6 (six) hours as needed.  . rosuvastatin (CRESTOR) 20 MG tablet TAKE ONE (1) TABLET BY MOUTH ONCE DAILY  . TRUEplus Lancets 30G MISC 1 each by Does not apply route 2 (two) times daily.  . TRULICITY 3 NW/2.9FA SOPN Inject 3 mg as directed once a  week.  . valACYclovir (VALTREX) 1000 MG tablet TAKE TWO TABLETS BY MOUTH twice A DAY FOR ONE DAY as needed  . venlafaxine XR (EFFEXOR-XR) 75 MG 24 hr capsule Take 1 capsule (75 mg total) by mouth every morning.   No facility-administered encounter medications on file as of 10/06/2020.    Current Diagnosis: Patient Active Problem List   Diagnosis Date Noted  . Severe recurrent major depression without psychotic features ([place]) 05/09/2020  . Atypical pigmented skin lesion 05/09/2020  . Dizziness 02/07/2020  . Dermatitis 01/12/2020  . Mixed stress and urge urinary incontinence 12/25/2019  . Localized edema 12/18/2019  . Dry mouth 12/18/2019  . Diabetic polyneuropathy associated with type 2 diabetes mellitus ([place]) 12/16/2019  . Weakness 12/07/2019  . Ingrown toenail of both feet 12/07/2019  . Moderate recurrent major depression ([place]) 12/03/2019  . Cigarette nicotine dependence with nicotine-induced disorder 12/03/2019  . Type 2 diabetes mellitus with hypertriglyceridemia ([place]) 12/03/2019  . Hypertensive heart disease   . Oral thrush 10/28/2015  . Pituitary adenoma ([place]) 02/22/2015  . Cerebral infarction due to thrombosis of left middle cerebral artery ([place]) 08/23/2014  . HLD (hyperlipidemia) 08/23/2014  . Essential hypertension 08/23/2014  . PVD (peripheral vascular disease) ([place]) 08/23/2014  . Former smoker 08/23/2014  . COPD (chronic obstructive pulmonary disease) ([place]) 07/13/2014  . OSA (obstructive sleep apnea) 07/13/2014  . Chronic diastolic heart failure ([place]) 07/13/2014  . Stroke ([place]) 07/12/2014  . HTN (hypertension) 07/12/2014  . Peripheral  vascular disease, unspecified ([place]) 02/17/2013  . Aftercare following surgery of the circulatory system, [place] 02/17/2013  . Chest pain 10/16/2012  . Coronary artery disease   . PAD (peripheral artery disease) ([place])   . Hyperlipidemia   . Tobacco abuse   . History of myocardial infarction 01/22/2010    Patient called requesting a  refill of her test strips. I have put in an acute fill form for the pharmacy to send out today. Patient states she is completely out at this time. States otherwise she is doing ok.   Follow-Up:  Comptroller and Pharmacist Review   Elly [place]. Owens Shark, [place] notified  Margaretmary Dys, [facility] Pharmacy Assistant [phone]

## 2020-10-06 NOTE — Progress Notes (Signed)
Chronic Care Management Pharmacy Assistant   Name: Sonya Small  MRN: [id number] DOB: May 25, 1951  Reason for Encounter: Medication Review   PCP : [name], MD  Allergies:  No Known Allergies  Medications: Outpatient Encounter Medications as of 10/06/2020  Medication Sig  . ALPRAZolam (XANAX) 0.5 MG tablet Take 1 tablet (0.5 mg total) by mouth 3 (three) times daily.  . ARIPiprazole (ABILIFY) 10 MG tablet Take 1 tablet (10 mg total) by mouth daily.  . Aspirin-Acetaminophen (GOODYS BODY PAIN PO) Take 1 packet by mouth daily.  . clopidogrel (PLAVIX) 75 MG tablet TAKE ONE (1) TABLET BY MOUTH ONCE DAILY  . diclofenac Sodium (VOLTAREN) 1 % GEL   . fenofibrate 160 MG tablet TAKE ONE (1) TABLET ONCE DAILY  . fluticasone (FLONASE) 50 MCG/ACT nasal spray PLACE ONE SPRAY INTO BOTH NOSTRILS DAILY  . Fluticasone-Salmeterol,sensor, (AIRDUO DIGIHALER) 113-14 MCG/ACT AEPB Inhale 1 puff into the lungs in the morning and at bedtime.  [name] glucose blood (ONETOUCH VERIO) test strip USE DAILY TO CHECK YOUR BLOOD SUGAR (E11.69)  . icosapent Ethyl (VASCEPA) 1 g capsule TAKE 2 CAPSULES TWICE A DAY WITH FOOD  . lamoTRIgine (LAMICTAL) 25 MG tablet TAKE TWO TABLETS BY MOUTH EVERYDAY AT BEDTIME  . lansoprazole (PREVACID) 30 MG capsule TAKE ONE CAPSULE BY MOUTH EVERY MORNING  . lisinopril-hydrochlorothiazide (ZESTORETIC) 10-12.5 MG tablet Take 1 tablet by mouth daily.  . nitroGLYCERIN (NITROSTAT) 0.4 MG SL tablet Place 1 tablet (0.4 mg total) under the tongue every 5 (five) minutes as needed.  [name] VERIO test strip USE DAILY TO CHECK YOUR BLOOD SUGAR (E11.69) Test 1 x daily  . oxyCODONE-acetaminophen (PERCOCET) 10-325 MG tablet Take 1 tablet by mouth every 6 (six) hours as needed.  . rosuvastatin (CRESTOR) 20 MG tablet TAKE ONE (1) TABLET BY MOUTH ONCE DAILY  . TRUEplus Lancets 30G MISC 1 each by Does not apply route 2 (two) times daily.  . TRULICITY 3 FT/7.3UK SOPN Inject 3 mg as directed once a week.   . valACYclovir (VALTREX) 1000 MG tablet TAKE TWO TABLETS BY MOUTH twice A DAY FOR ONE DAY as needed  . venlafaxine XR (EFFEXOR-XR) 75 MG 24 hr capsule Take 1 capsule (75 mg total) by mouth every morning.   No facility-administered encounter medications on file as of 10/06/2020.    Current Diagnosis: Patient Active Problem List   Diagnosis Date Noted  . Severe recurrent major depression without psychotic features ([name]) 05/09/2020  . Atypical pigmented skin lesion 05/09/2020  . Dizziness 02/07/2020  . Dermatitis 01/12/2020  . Mixed stress and urge urinary incontinence 12/25/2019  . Localized edema 12/18/2019  . Dry mouth 12/18/2019  . Diabetic polyneuropathy associated with type 2 diabetes mellitus ([place]) 12/16/2019  . Weakness 12/07/2019  . Ingrown toenail of both feet 12/07/2019  . Moderate recurrent major depression ([place]) 12/03/2019  . Cigarette nicotine dependence with nicotine-induced disorder 12/03/2019  . Type 2 diabetes mellitus with hypertriglyceridemia ([place]) 12/03/2019  . Hypertensive heart disease   . Oral thrush 10/28/2015  . Pituitary adenoma ([place]) 02/22/2015  . Cerebral infarction due to thrombosis of left middle cerebral artery ([place]) 08/23/2014  . HLD (hyperlipidemia) 08/23/2014  . Essential hypertension 08/23/2014  . PVD (peripheral vascular disease) ([place]) 08/23/2014  . Former smoker 08/23/2014  . COPD (chronic obstructive pulmonary disease) ([place]) 07/13/2014  . OSA (obstructive sleep apnea) 07/13/2014  . Chronic diastolic heart failure ([place]) 07/13/2014  . Stroke ([place]) 07/12/2014  . HTN (hypertension) 07/12/2014  . Peripheral vascular  disease, unspecified ([place]) 02/17/2013  . Aftercare following surgery of the circulatory system, [place] 02/17/2013  . Chest pain 10/16/2012  . Coronary artery disease   . PAD (peripheral artery disease) ([place])   . Hyperlipidemia   . Tobacco abuse   . History of myocardial infarction 01/22/2010   Patient called and left me a voice mail  in reference to her test strips.  Sonya Small sent in a request for them earlier today. I will touch base with the patient and let her know the request was made for them to be sent out today.    [facility] said that her strips are not covered by her insurance, and her regular pharmacy is out of them.  I called the patient to tell her that our cash price is $78.68 and she said she could not afford that and wondered what to do about her sugar.   I told her I would send Sonya Small, CPP a message and see what she advised.     Follow-Up:  Coordination of Enhanced Pharmacy Services and Pharmacist Review  Sonya Small, CPP notified  Sonya Small, [place] Pharmacist Assistant [phone]

## 2020-10-06 NOTE — Progress Notes (Signed)
Chronic Care Management Pharmacy Assistant   Name: Sonya Small  MRN: [id number] DOB: [birth year]  Reason for Encounter: Medication Review for glucose test strip refill   PCP : [name], MD  Allergies:  No Known Allergies  Medications: Outpatient Encounter Medications as of 10/06/2020  Medication Sig   ALPRAZolam (XANAX) 0.5 MG tablet Take 1 tablet (0.5 mg total) by mouth 3 (three) times daily.   ARIPiprazole (ABILIFY) 10 MG tablet Take 1 tablet (10 mg total) by mouth daily.   Aspirin-Acetaminophen (GOODYS BODY PAIN PO) Take 1 packet by mouth daily.   clopidogrel (PLAVIX) 75 MG tablet TAKE ONE TABLET BY MOUTH EVERY MORNING   diclofenac Sodium (VOLTAREN) 1 % GEL    fenofibrate 160 MG tablet TAKE ONE TABLET BY MOUTH EVERY MORNING   fluticasone (FLONASE) 50 MCG/ACT nasal spray PLACE ONE SPRAY INTO BOTH NOSTRILS DAILY   Fluticasone-Salmeterol,sensor, (AIRDUO DIGIHALER) 113-14 MCG/ACT AEPB Inhale 1 puff into the lungs in the morning and at bedtime.   glucose blood (ONETOUCH VERIO) test strip USE DAILY TO CHECK YOUR BLOOD SUGAR (E11.69)   lamoTRIgine (LAMICTAL) 25 MG tablet TAKE TWO TABLETS BY MOUTH EVERYDAY AT BEDTIME   lansoprazole (PREVACID) 30 MG capsule TAKE ONE CAPSULE BY MOUTH EVERY MORNING   lisinopril-hydrochlorothiazide (ZESTORETIC) 10-12.5 MG tablet TAKE ONE TABLET BY MOUTH EVERY MORNING   nitroGLYCERIN (NITROSTAT) 0.4 MG SL tablet Place 1 tablet (0.4 mg total) under the tongue every 5 (five) minutes as needed.   ONETOUCH VERIO test strip USE DAILY TO CHECK YOUR BLOOD SUGAR (E11.69) Test 1 x daily   oxyCODONE-acetaminophen (PERCOCET) 10-325 MG tablet Take 1 tablet by mouth every 6 (six) hours as needed.   rosuvastatin (CRESTOR) 20 MG tablet TAKE ONE TABLET BY MOUTH EVERYDAY AT BEDTIME   TRUEplus Lancets 30G MISC 1 each by Does not apply route 2 (two) times daily.   TRULICITY 3 IW/5.8KD SOPN Inject 3 mg as directed once a week.   valACYclovir (VALTREX)  1000 MG tablet TAKE TWO TABLETS BY MOUTH twice A DAY FOR ONE DAY as needed   VASCEPA 1 g capsule TAKE TWO CAPSULES BY MOUTH EVERY MORNING and TAKE TWO TABLETS BY MOUTH EVERYDAY AT BEDTIME   venlafaxine XR (EFFEXOR-XR) 75 MG 24 hr capsule Take 1 capsule (75 mg total) by mouth every morning.   No facility-administered encounter medications on file as of 10/06/2020.    Current Diagnosis: Patient Active Problem List   Diagnosis Date Noted   Severe recurrent major depression without psychotic features ([place]) 05/09/2020   Atypical pigmented skin lesion 05/09/2020   Dizziness 02/07/2020   Dermatitis 01/12/2020   Mixed stress and urge urinary incontinence 12/25/2019   Localized edema 12/18/2019   Dry mouth 12/18/2019   Diabetic polyneuropathy associated with type 2 diabetes mellitus ([place]) 12/16/2019   Weakness 12/07/2019   Ingrown toenail of both feet 12/07/2019   Moderate recurrent major depression ([place]) 12/03/2019   Cigarette nicotine dependence with nicotine-induced disorder 12/03/2019   Type 2 diabetes mellitus with hypertriglyceridemia ([place]) 12/03/2019   Hypertensive heart disease    Oral thrush 10/28/2015   Pituitary adenoma ([place]) 02/22/2015   Cerebral infarction due to thrombosis of left middle cerebral artery ([place]) 08/23/2014   HLD (hyperlipidemia) 08/23/2014   Essential hypertension 08/23/2014   PVD (peripheral vascular disease) ([place]) 08/23/2014   Former smoker 08/23/2014   COPD (chronic obstructive pulmonary disease) ([place]) 07/13/2014   OSA (obstructive sleep apnea) 07/13/2014   Chronic diastolic heart failure ([place]) 07/13/2014  Stroke ([place]) 07/12/2014   HTN (hypertension) 07/12/2014   Peripheral vascular disease, unspecified ([place]) 02/17/2013   Aftercare following surgery of the circulatory system, NEC 02/17/2013   Chest pain 10/16/2012   Coronary artery disease    PAD (peripheral artery disease) ([place])    Hyperlipidemia    Tobacco abuse     History of myocardial infarction 01/22/2010    I called the patients pharmacy to see if they would [facility] test strips since she was out.  [facility] and [facility] [address] [facility] showing [facility] them.  [facility] [address] [phone] to make sure they had them in stock, they indeed have them.  I sent [name], CPP a message that a new prescription would be needed as they can't transfer it due to her insurance.  [name] CPP said she would take care of this.  I contacted the patient and left message at home and on cell that [facility] has her strips a new script was going to be sent and for her to check with them to make sure it was ready for pick up.   Follow-Up:  Pharmacist Review  [name]. CPP notified  Clarita Leber, [place] Pharmacist Assistant [phone]

## 2020-10-07 ENCOUNTER — Telehealth: Payer: Self-pay

## 2020-10-07 LAB — COMPREHENSIVE METABOLIC PANEL
ALT: 17 IU/L (ref 0–32)
AST: 18 IU/L (ref 0–40)
Albumin/Globulin Ratio: 1.6 (ref 1.2–2.2)
Albumin: 4.1 g/dL (ref 3.7–4.7)
Alkaline Phosphatase: 85 IU/L (ref 44–121)
BUN/Creatinine Ratio: 25 (ref 12–28)
BUN: 18 mg/dL (ref 8–27)
Bilirubin Total: <0.2 mg/dL (ref 0.0–1.2)
CO2: 26 mmol/L (ref 20–29)
Calcium: 9.1 mg/dL (ref 8.7–10.3)
Chloride: 106 mmol/L (ref 96–106)
Creatinine, Ser: 0.72 mg/dL (ref 0.57–1.00)
GFR calc Af Amer: 97 mL/min/{1.73_m2} (ref >59)
GFR calc non Af Amer: 85 mL/min/{1.73_m2} (ref >59)
Globulin, Total: 2.5 g/dL (ref 1.5–4.5)
Glucose: 122 mg/dL — ABNORMAL HIGH (ref 65–99)
Potassium: 3.8 mmol/L (ref 3.5–5.2)
Sodium: 145 mmol/L — ABNORMAL HIGH (ref 134–144)
Total Protein: 6.6 g/dL (ref 6.0–8.5)

## 2020-10-07 LAB — CBC WITH DIFFERENTIAL/PLATELET
Basophils Absolute: 0.1 10*3/uL (ref 0.0–0.2)
Basos: 1 %
EOS (ABSOLUTE): 0.2 10*3/uL (ref 0.0–0.4)
Eos: 2 %
Hematocrit: 44.5 % (ref 34.0–46.6)
Hemoglobin: 14.3 g/dL (ref 11.1–15.9)
Immature Grans (Abs): 0.1 10*3/uL (ref 0.0–0.1)
Immature Granulocytes: 1 %
Lymphocytes Absolute: 2.6 10*3/uL (ref 0.7–3.1)
Lymphs: 32 %
MCH: 28.3 pg (ref 26.6–33.0)
MCHC: 32.1 g/dL (ref 31.5–35.7)
MCV: 88 fL (ref 79–97)
Monocytes Absolute: 0.6 10*3/uL (ref 0.1–0.9)
Monocytes: 7 %
Neutrophils Absolute: 4.8 10*3/uL (ref 1.4–7.0)
Neutrophils: 57 %
Platelets: 257 10*3/uL (ref 150–450)
RBC: 5.06 x10E6/uL (ref 3.77–5.28)
RDW: 12.1 % (ref 11.7–15.4)
WBC: 8.2 10*3/uL (ref 3.4–10.8)

## 2020-10-07 LAB — B12 AND FOLATE PANEL
Folate: 11.6 ng/mL (ref >3.0)
Vitamin B-12: 417 pg/mL (ref 232–1245)

## 2020-10-07 LAB — METHYLMALONIC ACID, SERUM: Methylmalonic Acid: 188 nmol/L (ref 0–378)

## 2020-10-07 NOTE — Progress Notes (Signed)
Chronic Care Management Pharmacy Assistant   Name: Sonya Small  MRN: [id number] DOB: Aug 25, 1951  Reason for Encounter: Medication Review for her Abilify     PCP : [name], MD  Allergies:  No Known Allergies  Medications: Outpatient Encounter Medications as of 10/07/2020  Medication Sig   ALPRAZolam (XANAX) 0.5 MG tablet Take 1 tablet (0.5 mg total) by mouth 3 (three) times daily.   ARIPiprazole (ABILIFY) 10 MG tablet Take 1 tablet (10 mg total) by mouth daily.   Aspirin-Acetaminophen (GOODYS BODY PAIN PO) Take 1 packet by mouth daily.   clopidogrel (PLAVIX) 75 MG tablet TAKE ONE TABLET BY MOUTH EVERY MORNING   diclofenac Sodium (VOLTAREN) 1 % GEL    fenofibrate 160 MG tablet TAKE ONE TABLET BY MOUTH EVERY MORNING   fluticasone (FLONASE) 50 MCG/ACT nasal spray PLACE ONE SPRAY INTO BOTH NOSTRILS DAILY   Fluticasone-Salmeterol,sensor, (AIRDUO DIGIHALER) 113-14 MCG/ACT AEPB Inhale 1 puff into the lungs in the morning and at bedtime.   glucose blood (ONETOUCH VERIO) test strip USE DAILY TO CHECK YOUR BLOOD SUGAR (E11.69)   lamoTRIgine (LAMICTAL) 25 MG tablet TAKE TWO TABLETS BY MOUTH EVERYDAY AT BEDTIME   lansoprazole (PREVACID) 30 MG capsule TAKE ONE CAPSULE BY MOUTH EVERY MORNING   lisinopril-hydrochlorothiazide (ZESTORETIC) 10-12.5 MG tablet TAKE ONE TABLET BY MOUTH EVERY MORNING   nitroGLYCERIN (NITROSTAT) 0.4 MG SL tablet Place 1 tablet (0.4 mg total) under the tongue every 5 (five) minutes as needed.   ONETOUCH VERIO test strip USE DAILY TO CHECK YOUR BLOOD SUGAR (E11.69) Test 1 x daily   oxyCODONE-acetaminophen (PERCOCET) 10-325 MG tablet Take 1 tablet by mouth every 6 (six) hours as needed.   rosuvastatin (CRESTOR) 20 MG tablet TAKE ONE TABLET BY MOUTH EVERYDAY AT BEDTIME   TRUEplus Lancets 30G MISC 1 each by Does not apply route 2 (two) times daily.   TRULICITY 3 SN/0.5LZ SOPN Inject 3 mg as directed once a week.   valACYclovir (VALTREX) 1000 MG  tablet TAKE TWO TABLETS BY MOUTH twice A DAY FOR ONE DAY as needed   VASCEPA 1 g capsule TAKE TWO CAPSULES BY MOUTH EVERY MORNING and TAKE TWO TABLETS BY MOUTH EVERYDAY AT BEDTIME   venlafaxine XR (EFFEXOR-XR) 75 MG 24 hr capsule Take 1 capsule (75 mg total) by mouth every morning.   No facility-administered encounter medications on file as of 10/07/2020.    Current Diagnosis: Patient Active Problem List   Diagnosis Date Noted   Severe recurrent major depression without psychotic features (Sonya Small) 05/09/2020   Atypical pigmented skin lesion 05/09/2020   Dizziness 02/07/2020   Dermatitis 01/12/2020   Mixed stress and urge urinary incontinence 12/25/2019   Localized edema 12/18/2019   Dry mouth 12/18/2019   Diabetic polyneuropathy associated with type 2 diabetes mellitus (Sonya Small) 12/16/2019   Weakness 12/07/2019   Ingrown toenail of both feet 12/07/2019   Moderate recurrent major depression (Sonya Small) 12/03/2019   Cigarette nicotine dependence with nicotine-induced disorder 12/03/2019   Type 2 diabetes mellitus with hypertriglyceridemia (Sonya Small) 12/03/2019   Hypertensive heart disease    Oral thrush 10/28/2015   Pituitary adenoma (Sonya Small) 02/22/2015   Cerebral infarction due to thrombosis of left middle cerebral artery (Sonya Small) 08/23/2014   HLD (hyperlipidemia) 08/23/2014   Essential hypertension 08/23/2014   PVD (peripheral vascular disease) (Sonya Small) 08/23/2014   Former smoker 08/23/2014   COPD (chronic obstructive pulmonary disease) (Sonya Small) 07/13/2014   OSA (obstructive sleep apnea) 07/13/2014   Chronic diastolic heart failure (Sonya Small) 07/13/2014  Stroke (Sonya Small) 07/12/2014   HTN (hypertension) 07/12/2014   Peripheral vascular disease, unspecified (Sonya Small) 02/17/2013   Aftercare following surgery of the circulatory system, NEC 02/17/2013   Chest pain 10/16/2012   Coronary artery disease    PAD (peripheral artery disease) (Sonya Small)    Hyperlipidemia    Tobacco abuse    History  of myocardial infarction 01/22/2010   Patient called and stated she has not received her Abilify that the dosage with changed to 10mg  daily at her last appointment with Dr. [name].    I checked in [name] and the only script that was showing sent was the trulicity.  I explained to the patient there was not other script showing that had been sent in, I even checked the media section in [facility] for her and nothing was showing.    The patient said she would check with Dr. [name] office on Monday so see if she indeed sent in the changes.    Follow-Up:  Comptroller and Pharmacist Review  [name], CPP  Notified  Clarita Leber, [place] Pharmacist Assistant [phone]

## 2020-10-10 ENCOUNTER — Other Ambulatory Visit: Payer: Self-pay

## 2020-10-12 ENCOUNTER — Telehealth: Payer: Self-pay

## 2020-10-12 ENCOUNTER — Other Ambulatory Visit: Payer: Self-pay

## 2020-10-12 NOTE — Progress Notes (Signed)
Chronic Care Management Pharmacy Assistant   Name: Sonya Small  MRN: [id number] DOB: 04/27/1951  Reason for Encounter: [facility]   Patient called and left a message that she had called [facility] and was told they were not delivering any refrigerated medication today and she was in need of her Trulicity injection.  [facility]  I checked with our pharmacy and they confirmed [facility], they tried to contact the patient to tell her this but she did not answer.  I called the patient back to let her know the request had been sent and to check [facility] in a couple of hours to see if it was ready for pick up.    PCP : [name], MD  Allergies:  No Known Allergies  Medications: Outpatient Encounter Medications as of 10/12/2020  Medication Sig  . ALPRAZolam (XANAX) 0.5 MG tablet Take 1 tablet (0.5 mg total) by mouth 3 (three) times daily.  . ARIPiprazole (ABILIFY) 10 MG tablet Take 1 tablet (10 mg total) by mouth daily.  . Aspirin-Acetaminophen (GOODYS BODY PAIN PO) Take 1 packet by mouth daily.  . clopidogrel (PLAVIX) 75 MG tablet TAKE ONE TABLET BY MOUTH EVERY MORNING  . diclofenac Sodium (VOLTAREN) 1 % GEL   . fenofibrate 160 MG tablet TAKE ONE TABLET BY MOUTH EVERY MORNING  . fluticasone (FLONASE) 50 MCG/ACT nasal spray PLACE ONE SPRAY INTO BOTH NOSTRILS DAILY  . Fluticasone-Salmeterol,sensor, (AIRDUO DIGIHALER) 113-14 MCG/ACT AEPB Inhale 1 puff into the lungs in the morning and at bedtime.  [name] glucose blood (ONETOUCH VERIO) test strip USE DAILY TO CHECK YOUR BLOOD SUGAR (E11.69)  . lamoTRIgine (LAMICTAL) 25 MG tablet TAKE TWO TABLETS BY MOUTH EVERYDAY AT BEDTIME  . lansoprazole (PREVACID) 30 MG capsule TAKE ONE CAPSULE BY MOUTH EVERY MORNING  . lisinopril-hydrochlorothiazide (ZESTORETIC) 10-12.5 MG tablet TAKE ONE TABLET BY MOUTH EVERY MORNING  . nitroGLYCERIN (NITROSTAT) 0.4 MG SL tablet Place 1  tablet (0.4 mg total) under the tongue every 5 (five) minutes as needed.  [name] VERIO test strip USE DAILY TO CHECK YOUR BLOOD SUGAR (E11.69) Test 1 x daily  . oxyCODONE-acetaminophen (PERCOCET) 10-325 MG tablet Take 1 tablet by mouth every 6 (six) hours as needed.  . rosuvastatin (CRESTOR) 20 MG tablet TAKE ONE TABLET BY MOUTH EVERYDAY AT BEDTIME  . TRUEplus Lancets 30G MISC 1 each by Does not apply route 2 (two) times daily.  . TRULICITY 3 EU/2.3NT SOPN Inject 3 mg as directed once a week.  . valACYclovir (VALTREX) 1000 MG tablet TAKE TWO TABLETS BY MOUTH twice A DAY FOR ONE DAY as needed  . VASCEPA 1 g capsule TAKE TWO CAPSULES BY MOUTH EVERY MORNING and TAKE TWO TABLETS BY MOUTH EVERYDAY AT BEDTIME  . venlafaxine XR (EFFEXOR-XR) 75 MG 24 hr capsule Take 1 capsule (75 mg total) by mouth every morning.   No facility-administered encounter medications on file as of 10/12/2020.    Current Diagnosis: Patient Active Problem List   Diagnosis Date Noted  . Severe recurrent major depression without psychotic features ([place]) 05/09/2020  . Atypical pigmented skin lesion 05/09/2020  . Dizziness 02/07/2020  . Dermatitis 01/12/2020  . Mixed stress and urge urinary incontinence 12/25/2019  . Localized edema 12/18/2019  . Dry mouth 12/18/2019  . Diabetic polyneuropathy associated with type 2 diabetes mellitus ([place]) 12/16/2019  . Weakness 12/07/2019  . Ingrown toenail of both feet  12/07/2019  . Moderate recurrent major depression ([place]) 12/03/2019  . Cigarette nicotine dependence with nicotine-induced disorder 12/03/2019  . Type 2 diabetes mellitus with hypertriglyceridemia ([place]) 12/03/2019  . Hypertensive heart disease   . Oral thrush 10/28/2015  . Pituitary adenoma ([place]) 02/22/2015  . Cerebral infarction due to thrombosis of left middle cerebral artery ([place]) 08/23/2014  . HLD (hyperlipidemia) 08/23/2014  . Essential hypertension 08/23/2014  . PVD (peripheral vascular disease) ([place])  08/23/2014  . Former smoker 08/23/2014  . COPD (chronic obstructive pulmonary disease) ([place]) 07/13/2014  . OSA (obstructive sleep apnea) 07/13/2014  . Chronic diastolic heart failure ([place]) 07/13/2014  . Stroke ([place]) 07/12/2014  . HTN (hypertension) 07/12/2014  . Peripheral vascular disease, unspecified ([place]) 02/17/2013  . Aftercare following surgery of the circulatory system, [place] 02/17/2013  . Chest pain 10/16/2012  . Coronary artery disease   . PAD (peripheral artery disease) ([place])   . Hyperlipidemia   . Tobacco abuse   . History of myocardial infarction 01/22/2010      Follow-Up:  Pharmacist Review  [name], CPP notified  Clarita Leber, [place] Pharmacist Assistant [phone]

## 2020-10-13 ENCOUNTER — Telehealth: Payer: Self-pay

## 2020-10-13 NOTE — Progress Notes (Signed)
Chronic Care Management Pharmacy Assistant   Name: Sonya Small  MRN: [id number] DOB: [birth year]  Reason for Encounter: Call for Abilify refill    PCP : [name], MD  Allergies:  No Known Allergies  Medications: Outpatient Encounter Medications as of 10/13/2020  Medication Sig  . ALPRAZolam (XANAX) 0.5 MG tablet Take 1 tablet (0.5 mg total) by mouth 3 (three) times daily.  . ARIPiprazole (ABILIFY) 10 MG tablet Take 1 tablet (10 mg total) by mouth daily.  . Aspirin-Acetaminophen (GOODYS BODY PAIN PO) Take 1 packet by mouth daily.  . clopidogrel (PLAVIX) 75 MG tablet TAKE ONE TABLET BY MOUTH EVERY MORNING  . diclofenac Sodium (VOLTAREN) 1 % GEL   . fenofibrate 160 MG tablet TAKE ONE TABLET BY MOUTH EVERY MORNING  . fluticasone (FLONASE) 50 MCG/ACT nasal spray PLACE ONE SPRAY INTO BOTH NOSTRILS DAILY  . Fluticasone-Salmeterol,sensor, (AIRDUO DIGIHALER) 113-14 MCG/ACT AEPB Inhale 1 puff into the lungs in the morning and at bedtime.  [name] glucose blood (ONETOUCH VERIO) test strip USE DAILY TO CHECK YOUR BLOOD SUGAR (E11.69)  . lamoTRIgine (LAMICTAL) 25 MG tablet TAKE TWO TABLETS BY MOUTH EVERYDAY AT BEDTIME  . lansoprazole (PREVACID) 30 MG capsule TAKE ONE CAPSULE BY MOUTH EVERY MORNING  . lisinopril-hydrochlorothiazide (ZESTORETIC) 10-12.5 MG tablet TAKE ONE TABLET BY MOUTH EVERY MORNING  . nitroGLYCERIN (NITROSTAT) 0.4 MG SL tablet Place 1 tablet (0.4 mg total) under the tongue every 5 (five) minutes as needed.  [name] VERIO test strip USE DAILY TO CHECK YOUR BLOOD SUGAR (E11.69) Test 1 x daily  . oxyCODONE-acetaminophen (PERCOCET) 10-325 MG tablet Take 1 tablet by mouth every 6 (six) hours as needed.  . rosuvastatin (CRESTOR) 20 MG tablet TAKE ONE TABLET BY MOUTH EVERYDAY AT BEDTIME  . TRUEplus Lancets 30G MISC 1 each by Does not apply route 2 (two) times daily.  . TRULICITY 3 WU/9.8JX SOPN Inject 3 mg as directed once a week.  . valACYclovir (VALTREX) 1000 MG tablet TAKE TWO  TABLETS BY MOUTH twice A DAY FOR ONE DAY as needed  . VASCEPA 1 g capsule TAKE TWO CAPSULES BY MOUTH EVERY MORNING and TAKE TWO TABLETS BY MOUTH EVERYDAY AT BEDTIME  . venlafaxine XR (EFFEXOR-XR) 75 MG 24 hr capsule Take 1 capsule (75 mg total) by mouth every morning.   No facility-administered encounter medications on file as of 10/13/2020.    Current Diagnosis: Patient Active Problem List   Diagnosis Date Noted  . Severe recurrent major depression without psychotic features ([place]) 05/09/2020  . Atypical pigmented skin lesion 05/09/2020  . Dizziness 02/07/2020  . Dermatitis 01/12/2020  . Mixed stress and urge urinary incontinence 12/25/2019  . Localized edema 12/18/2019  . Dry mouth 12/18/2019  . Diabetic polyneuropathy associated with type 2 diabetes mellitus ([place]) 12/16/2019  . Weakness 12/07/2019  . Ingrown toenail of both feet 12/07/2019  . Moderate recurrent major depression ([place]) 12/03/2019  . Cigarette nicotine dependence with nicotine-induced disorder 12/03/2019  . Type 2 diabetes mellitus with hypertriglyceridemia ([place]) 12/03/2019  . Hypertensive heart disease   . Oral thrush 10/28/2015  . Pituitary adenoma ([place]) 02/22/2015  . Cerebral infarction due to thrombosis of left middle cerebral artery ([place]) 08/23/2014  . HLD (hyperlipidemia) 08/23/2014  . Essential hypertension 08/23/2014  . PVD (peripheral vascular disease) ([place]) 08/23/2014  . Former smoker 08/23/2014  . COPD (chronic obstructive pulmonary disease) ([place]) 07/13/2014  . OSA (obstructive sleep apnea) 07/13/2014  . Chronic diastolic heart failure ([place]) 07/13/2014  . Stroke [facility])  07/12/2014  . HTN (hypertension) 07/12/2014  . Peripheral vascular disease, unspecified ([place]) 02/17/2013  . Aftercare following surgery of the circulatory system, [place] 02/17/2013  . Chest pain 10/16/2012  . Coronary artery disease   . PAD (peripheral artery disease) ([place])   . Hyperlipidemia   . Tobacco abuse   . History of myocardial  infarction 01/22/2010   Patient called and needs a refill for her Aripiprazole 10mg , she has an old bottle that is 5mg  so she only has  as 6 pills left, because her dosage was changed.  I told her I would put in an acute request for her.  Sent acute fill form to Sonya Small, CPP for approval.  Follow-Up:  Coordination of Enhanced Pharmacy Services and Pharmacist Review  Sonya Small, CPP notified  Sonya Small, [place] Pharmacist Assistant [phone]

## 2020-10-14 ENCOUNTER — Other Ambulatory Visit: Payer: Self-pay

## 2020-10-14 MED ORDER — ARIPIPRAZOLE 10 MG PO TABS: 10.0000 mg | ORAL_TABLET | Freq: Every day | ORAL | 2 refills | Status: DC

## 2020-10-19 ENCOUNTER — Other Ambulatory Visit: Payer: Self-pay

## 2020-10-19 ENCOUNTER — Emergency Department (HOSPITAL_COMMUNITY)
Admission: EM | Admit: 2020-10-19 | Discharge: 2020-10-20 | Disposition: A | Discharge status: Home / Self Care | Payer: Medicare Other | Attending: Emergency Medicine | Admitting: Emergency Medicine

## 2020-10-19 ENCOUNTER — Encounter (HOSPITAL_COMMUNITY): Payer: Self-pay | Admitting: Emergency Medicine

## 2020-10-19 ENCOUNTER — Emergency Department (HOSPITAL_COMMUNITY): Payer: Medicare Other

## 2020-10-19 ENCOUNTER — Telehealth: Payer: Self-pay | Admitting: Cardiology

## 2020-10-19 DIAGNOSIS — M546 Pain in thoracic spine: Secondary | ICD-10-CM

## 2020-10-19 DIAGNOSIS — J449 Chronic obstructive pulmonary disease, unspecified: Secondary | ICD-10-CM | POA: Diagnosis not present

## 2020-10-19 DIAGNOSIS — Z7982 Long term (current) use of aspirin: Secondary | ICD-10-CM | POA: Insufficient documentation

## 2020-10-19 DIAGNOSIS — I2510 Atherosclerotic heart disease of native coronary artery without angina pectoris: Secondary | ICD-10-CM | POA: Diagnosis not present

## 2020-10-19 DIAGNOSIS — Z7952 Long term (current) use of systemic steroids: Secondary | ICD-10-CM | POA: Diagnosis not present

## 2020-10-19 DIAGNOSIS — E1142 Type 2 diabetes mellitus with diabetic polyneuropathy: Secondary | ICD-10-CM | POA: Insufficient documentation

## 2020-10-19 DIAGNOSIS — R079 Chest pain, unspecified: Secondary | ICD-10-CM | POA: Diagnosis not present

## 2020-10-19 DIAGNOSIS — Z79899 Other long term (current) drug therapy: Secondary | ICD-10-CM | POA: Diagnosis not present

## 2020-10-19 DIAGNOSIS — I110 Hypertensive heart disease with heart failure: Secondary | ICD-10-CM | POA: Diagnosis not present

## 2020-10-19 DIAGNOSIS — F17210 Nicotine dependence, cigarettes, uncomplicated: Secondary | ICD-10-CM | POA: Diagnosis not present

## 2020-10-19 DIAGNOSIS — I5032 Chronic diastolic (congestive) heart failure: Secondary | ICD-10-CM | POA: Insufficient documentation

## 2020-10-19 LAB — HEPATIC FUNCTION PANEL
ALT: 19 U/L (ref 0–44)
AST: 25 U/L (ref 15–41)
Albumin: 3.7 g/dL (ref 3.5–5.0)
Alkaline Phosphatase: 50 U/L (ref 38–126)
Bilirubin, Direct: <0.1 mg/dL (ref 0.0–0.2)
Total Bilirubin: 0.5 mg/dL (ref 0.3–1.2)
Total Protein: 6.3 g/dL — ABNORMAL LOW (ref 6.5–8.1)

## 2020-10-19 LAB — TROPONIN I (HIGH SENSITIVITY)
Troponin I (High Sensitivity): 5 ng/L (ref <18)
Troponin I (High Sensitivity): 6 ng/L (ref <18)

## 2020-10-19 LAB — BASIC METABOLIC PANEL
Anion gap: 10 (ref 5–15)
BUN: 16 mg/dL (ref 8–23)
CO2: 27 mmol/L (ref 22–32)
Calcium: 9 mg/dL (ref 8.9–10.3)
Chloride: 106 mmol/L (ref 98–111)
Creatinine, Ser: 0.7 mg/dL (ref 0.44–1.00)
GFR, Estimated: >60 mL/min (ref >60)
Glucose, Bld: 179 mg/dL — ABNORMAL HIGH (ref 70–99)
Potassium: 3.6 mmol/L (ref 3.5–5.1)
Sodium: 143 mmol/L (ref 135–145)

## 2020-10-19 LAB — CBC
HCT: 44.1 % (ref 36.0–46.0)
Hemoglobin: 13.9 g/dL (ref 12.0–15.0)
MCH: 28.5 pg (ref 26.0–34.0)
MCHC: 31.5 g/dL (ref 30.0–36.0)
MCV: 90.6 fL (ref 80.0–100.0)
Platelets: 239 10*3/uL (ref 150–400)
RBC: 4.87 MIL/uL (ref 3.87–5.11)
RDW: 13.2 % (ref 11.5–15.5)
WBC: 7.2 10*3/uL (ref 4.0–10.5)
nRBC: 0 % (ref 0.0–0.2)

## 2020-10-19 LAB — LIPASE, BLOOD: Lipase: 55 U/L — ABNORMAL HIGH (ref 11–51)

## 2020-10-19 MED ORDER — ACETAMINOPHEN 325 MG PO TABS: 650.0000 mg | ORAL_TABLET | Freq: Once | ORAL | Status: DC

## 2020-10-19 MED ORDER — LIDOCAINE VISCOUS HCL 2 % MT SOLN: 15.0000 mL | Freq: Once | OROMUCOSAL | Status: DC

## 2020-10-19 MED ORDER — ALUM & MAG HYDROXIDE-SIMETH 200-200-20 MG/5ML PO SUSP: 30.0000 mL | Freq: Once | ORAL | Status: DC

## 2020-10-19 NOTE — ED Triage Notes (Signed)
Patient complains of intermitted chest pain over the last three weeks. Pain feels like a stabbing and radiates from the chest into the back.

## 2020-10-19 NOTE — ED Provider Notes (Signed)
[facility] EMERGENCY DEPARTMENT Provider Note   CSN: [id number] Arrival date & time: 10/19/20  1517     History Chief Complaint  Patient presents with   Chest Pain    Sonya Small is a 70 y.o. female.  70 year old female with history of CAD and NSTEMI (s/p DES to LCx, on Plavix), dCHF, HLD, HTN, PAD, right MCA stroke (2015), OSA, tobacco use presents to the ED for c/o back pain. Back pain is between both shoulder blades and radiates to the chest. It is sharp and has been associated with some paresthesias in her b/l hands and fingers. Pain has been recurrent x 3 weeks. Patient reports the patient usually coming on around 1700 daily. Her pain last night at 2AM was similar, but more severe prompting ED evaluation. It was improved with NTG. She is presently pain free. Pain is nonexertional and is not associated with diaphoresis, clamminess, syncope, hemoptysis, SOB, abdominal pain, extremity weakness, fever. States that she has never had similar pain with her prior cardiac events. Does have f/u scheduled over the phone with her cardiologist on Friday.  The history is provided by the patient. No language interpreter was used.  Chest Pain      Past Medical History:  Diagnosis Date   Anxiety    CAD (coronary artery disease)    a. NSTEMI 11/2008 s/p DES to LCx (3.0x12 Xience); b. NSTEMI 01/2010 secondary to thrombotic RCA lesion (non-obstructive)-->med rx (integrilin x 24 hrs + plavix); c. 09/2012 negative Myoview.   Chronic diastolic CHF (congestive heart failure) ([place])    a. 06/2014 Echo: EF 55-60%, no rwma, Gr1 DD, mild AI.   Depression    Dizziness    Drug induced constipation    Generalized hyperhidrosis    GERD (gastroesophageal reflux disease)    Headache    Hyperlipidemia    Hypertensive heart disease    Lumbar disc disease    Metabolic encephalopathy    Mixed hyperlipidemia    Myocardial infarction ([place])    Obstructive sleep apnea    OP  (osteoporosis)    Osteoarthritis    Osteoporosis    Other malaise    Overweight(278.02)    PAD (peripheral artery disease) ([place])    a. Emboli to R foot 2010 from partially occlusive lesion in R EIA, s/p stenting. - followed by Dr. Donnetta Hutching;  b. 10/2015 ABIs: R 1.03, L 0.97.   Restless leg    Sleep apnea    Stroke [facility])    TIA (transient ischemic attack)    Tobacco abuse    Urge incontinence     Patient Active Problem List   Diagnosis Date Noted   Severe recurrent major depression without psychotic features ([place]) 05/09/2020   Atypical pigmented skin lesion 05/09/2020   Dizziness 02/07/2020   Dermatitis 01/12/2020   Mixed stress and urge urinary incontinence 12/25/2019   Localized edema 12/18/2019   Dry mouth 12/18/2019   Diabetic polyneuropathy associated with type 2 diabetes mellitus ([place]) 12/16/2019   Weakness 12/07/2019   Ingrown toenail of both feet 12/07/2019   Moderate recurrent major depression (HCC) 12/03/2019   Cigarette nicotine dependence with nicotine-induced disorder 12/03/2019   Type 2 diabetes mellitus with hypertriglyceridemia ([place]) 12/03/2019   Hypertensive heart disease    Oral thrush 10/28/2015   Pituitary adenoma ([place]) 02/22/2015   Cerebral infarction due to thrombosis of left middle cerebral artery ([place]) 08/23/2014   HLD (hyperlipidemia) 08/23/2014   Essential hypertension 08/23/2014   PVD (peripheral vascular disease) ([place])  08/23/2014   Former smoker 08/23/2014   COPD (chronic obstructive pulmonary disease) ([place]) 07/13/2014   OSA (obstructive sleep apnea) 07/13/2014   Chronic diastolic heart failure ([place]) 07/13/2014   Stroke ([place]) 07/12/2014   HTN (hypertension) 07/12/2014   Peripheral vascular disease, unspecified ([place]) 02/17/2013   Aftercare following surgery of the circulatory system, NEC 02/17/2013   Chest pain 10/16/2012   Coronary artery disease    PAD (peripheral artery disease) (HCC)    Hyperlipidemia     Tobacco abuse    History of myocardial infarction 01/22/2010    Past Surgical History:  Procedure Laterality Date   EYE SURGERY     at age 31   hysterectomy -age 38     ILIAC ARTERY STENT     RIGHT ILIAC STENT   KNEE ARTHROSCOPY     LUMBAR LAMINECTOMY     TUBAL LIGATION       OB History   No obstetric history on file.     Family History  Problem Relation Age of Onset   Alzheimer's disease Mother    Cancer Brother    Heart disease Other        Grandfather   Hepatitis C Brother    Diabetes Brother    Thyroid disease Brother    Hypertension Brother    Depression Brother     Social History   Tobacco Use   Smoking status: Current Every Day Smoker    Packs/day: 0.50    Types: Cigarettes    Last attempt to quit: 11/20/2012    Years since quitting: 7.9   Smokeless tobacco: Never Used  Substance Use Topics   Alcohol use: No    Alcohol/week: 0.0 standard drinks   Drug use: No    Home Medications Prior to Admission medications   Medication Sig Start Date End Date Taking? Authorizing Provider  ALPRAZolam Duanne Moron) 0.5 MG tablet Take 1 tablet (0.5 mg total) by mouth 3 (three) times daily. 10/03/20   [name], Elnita Maxwell, MD  ARIPiprazole (ABILIFY) 10 MG tablet Take 1 tablet (10 mg total) by mouth daily. 10/14/20   [name], MD  Aspirin-Acetaminophen (GOODYS BODY PAIN PO) Take 1 packet by mouth daily.    [provider]  clopidogrel (PLAVIX) 75 MG tablet TAKE ONE TABLET BY MOUTH EVERY MORNING 10/06/20   Marge Duncans, PA-C  diclofenac Sodium (VOLTAREN) 1 % GEL  10/28/19   [provider]  fenofibrate 160 MG tablet TAKE ONE TABLET BY MOUTH EVERY MORNING 10/06/20   Marge Duncans, PA-C  fluticasone [facility]) 50 MCG/ACT nasal spray PLACE ONE SPRAY INTO BOTH NOSTRILS DAILY 08/08/20   [name], [name], MD  Fluticasone-Salmeterol,sensor, [facility]) [phone] MCG/ACT AEPB Inhale 1 puff into the lungs in the morning and at bedtime.    [provider]  glucose blood (ONETOUCH VERIO) test strip USE DAILY TO CHECK YOUR BLOOD SUGAR (E11.69) 10/06/20   [name], Elnita Maxwell, MD  lamoTRIgine (LAMICTAL) 25 MG tablet TAKE TWO TABLETS BY MOUTH EVERYDAY AT BEDTIME 07/11/20   [name], [name], MD  lansoprazole (PREVACID) 30 MG capsule TAKE ONE CAPSULE BY MOUTH EVERY MORNING 07/11/20   [name], [name], MD  lisinopril-hydrochlorothiazide (ZESTORETIC) 10-12.5 MG tablet TAKE ONE TABLET BY MOUTH EVERY MORNING 10/06/20   Marge Duncans, PA-C  nitroGLYCERIN (NITROSTAT) 0.4 MG SL tablet Place 1 tablet (0.4 mg total) under the tongue every 5 (five) minutes as needed. 01/25/20   [name], MD  [facility] VERIO test strip USE DAILY TO CHECK YOUR BLOOD SUGAR (E11.69) Test 1  x daily 10/06/20   [name], Elnita Maxwell, MD  oxyCODONE-acetaminophen (PERCOCET) 10-325 MG tablet Take 1 tablet by mouth every 6 (six) hours as needed. 12/15/19   [provider]  rosuvastatin (CRESTOR) 20 MG tablet TAKE ONE TABLET BY MOUTH EVERYDAY AT BEDTIME 10/06/20   Marge Duncans, PA-C  TRUEplus Lancets 30G MISC 1 each by Does not apply route 2 (two) times daily. 04/15/20   [name], [name], MD  TRULICITY 3 UV/2.5DG SOPN Inject 3 mg as directed once a week. 10/05/20   Marge Duncans, PA-C  valACYclovir (VALTREX) 1000 MG tablet TAKE TWO TABLETS BY MOUTH twice A DAY FOR ONE DAY as needed 07/18/20   [name], Elnita Maxwell, MD  VASCEPA 1 g capsule TAKE TWO CAPSULES BY MOUTH EVERY MORNING and TAKE TWO TABLETS BY MOUTH EVERYDAY AT BEDTIME 10/06/20   Marge Duncans, PA-C  venlafaxine XR (EFFEXOR-XR) 75 MG 24 hr capsule Take 1 capsule (75 mg total) by mouth every morning. 01/25/20   [name], MD    Allergies    Patient has no known allergies.  Review of Systems   Review of Systems  Cardiovascular: Positive for chest pain.  Ten systems reviewed and are negative for acute change, except as noted in the HPI.    Physical Exam Updated Vital Signs BP (!) 133/51    Pulse 77    Temp 98.3 F (36.8 C) (Oral)    Resp 20    SpO2 97%   Physical  Exam Vitals and nursing note reviewed.  Constitutional:      General: She is not in acute distress.    Appearance: She is well-developed and well-nourished. She is not diaphoretic.     Comments: Nontoxic appearing and in NAD  HENT:     Head: Normocephalic and atraumatic.  Eyes:     General: No scleral icterus.    Extraocular Movements: EOM normal.     Conjunctiva/sclera: Conjunctivae normal.  Cardiovascular:     Rate and Rhythm: Normal rate and regular rhythm.     Pulses: Normal pulses.  Pulmonary:     Effort: Pulmonary effort is normal. No respiratory distress.     Breath sounds: No stridor. No wheezing, rhonchi or rales.     Comments: Lungs CTAB. Respirations even and unlabored. Musculoskeletal:        General: Normal range of motion.     Cervical back: Normal range of motion.     Comments: Trace pitting edema BLE  Skin:    General: Skin is warm and dry.     Coloration: Skin is not pale.     Findings: No erythema or rash.  Neurological:     Mental Status: She is alert and oriented to person, place, and time.  Psychiatric:        Mood and Affect: Mood and affect normal.        Behavior: Behavior normal.     ED Results / Procedures / Treatments   Labs (all labs ordered are listed, but only abnormal results are displayed) Labs Reviewed  BASIC METABOLIC PANEL - Abnormal; Notable for the following components:      Result Value   Glucose, Bld 179 (*)    All other components within normal limits  HEPATIC FUNCTION PANEL - Abnormal; Notable for the following components:   Total Protein 6.3 (*)    All other components within normal limits  LIPASE, BLOOD - Abnormal; Notable for the following components:   Lipase 55 (*)    All other components within normal  limits  CBC  TROPONIN I (HIGH SENSITIVITY)  TROPONIN I (HIGH SENSITIVITY)    EKG EKG Interpretation  Date/Time:  Wednesday October 19 2020 22:04:44 EST Ventricular Rate:  76 PR Interval:    QRS Duration: 92 QT  Interval:  408 QTC Calculation: 459 R Axis:   49 Text Interpretation: Sinus rhythm Anterior infarct, old Minimal ST depression, lateral leads NO STEMI. Confirmed by Addison Lank [phone]) on 10/19/2020 11:23:09 PM   Radiology DG Chest 2 View  Result Date: 10/19/2020 CLINICAL DATA:  70 year old female with chest pain for 3 weeks. EXAM: CHEST - 2 VIEW COMPARISON:  10/29/2019 and prior chest radiographs FINDINGS: The cardiomediastinal silhouette is unremarkable. Mild chronic peribronchial thickening again noted. There is no evidence of focal airspace disease, pulmonary edema, suspicious pulmonary nodule/mass, pleural effusion, or pneumothorax. No acute bony abnormalities are identified. IMPRESSION: No active cardiopulmonary disease. Electronically Signed   By: Margarette Canada M.D.   On: 10/19/2020 16:19    Myoview Stress 03/2018  The left ventricular ejection fraction is mildly decreased (45-54%).  Nuclear stress EF: 52%.  No T wave inversion was noted during stress.  There was no ST segment deviation noted during stress.  Defect 1: There is a small defect of moderate severity.  This is a low risk study   Procedures Procedures   Medications Ordered in ED Medications - No data to display  ED Course  I have reviewed the triage vital signs and the nursing notes.  Pertinent labs & imaging results that were available during my care of the patient were reviewed by me and considered in my medical decision making (see chart for details).    MDM Rules/Calculators/A&P                          70 year old female with hx of NSTEMI, HTN, HLD, tobacco use presents to the emergency department for evaluation of pain between her shoulder blades which has been recurrent over the past 3 weeks.  It was worse around 2 AM prompting her visit to the ED.  Chest pain free since the afternoon.   Work up initiated in triage and generally reassuring. Troponin negative x 2. EKG with minimal ST depression in  V5-6 compared to prior, but no STEMI.  Chest x-ray without acute cardiopulmonary abnormality.  Specifically, no pneumonia, pneumothorax, pleural effusion, mediastinal widening.  Pain is atypical of cardiac etiology, originating in back. Symptoms are nonexertional and without associated SOB, diaphresis, N/V.   Given symptom chronicity and results today, feel patient is stable for follow up with her cardiologist. She has an appointment scheduled in 2 days. Patient advised to continue daily medications, return for symptom worsening. Return precautions discussed and provided. Patient discharged in satisfactory condition with no unaddressed concerns.   Final Clinical Impression(s) / ED Diagnoses Final diagnoses:  Acute midline thoracic back pain  Nonspecific chest pain    Rx / DC Orders ED Discharge Orders    None       Antonietta Breach, PA-C 10/20/20 0209    Fatima Blank, MD 10/20/20 [phone]

## 2020-10-19 NOTE — Telephone Encounter (Signed)
Pt c/o of Chest Pain: STAT if CP now or developed within 24 hours  1. Are you having CP right now? Chest pain that go through back about for about 15 minutes  2. Are you experiencing any other symptoms (ex. SOB, nausea, vomiting, sweating)? Tips of right fingers are numb  3. How long have you been experiencing CP? Started last  4. Is your CP continuous or coming and going?   5. Have you taken Nitroglycerin? Yes- pt wants to be seen- I made a Virtual appt for Friday ?

## 2020-10-19 NOTE — Telephone Encounter (Signed)
Spoke to patient she stated she has been having pain in upper back across shoulders radiating into chest off and on for the past 2 weeks.Stated last night pain worse.Pain relieved by NTG x 1.Stated she is having pain at present across upper back into chest.Numbness in right fingers.Rates pain # 5.Advised to go to ED.Trish notified.

## 2020-10-19 NOTE — Progress Notes (Signed)
Virtual Visit via Telephone Note   This visit type was conducted due to national recommendations for restrictions regarding the COVID-19 Pandemic (e.g. social distancing) in an effort to limit this patient's exposure and mitigate transmission in our community.  Due to her co-morbid illnesses, this patient is at least at moderate risk for complications without adequate follow up.  This format is felt to be most appropriate for this patient at this time.  The patient did not have access to video technology/had technical difficulties with video requiring transitioning to audio format only (telephone).  All issues noted in this document were discussed and addressed.  No physical exam could be performed with this format.  Please refer to the patient's chart for her  consent to telehealth for [facility].    Date:  10/19/2020   ID:  Sonya Small, DOB 1950/11/14, MRN [id number] The patient was identified using 2 identifiers.  Patient Location: Home Provider Location: Office/Clinic  PCP:  [name], MD  Cardiologist:  Eowyn Tabone [name], MD  Electrophysiologist:  None   Evaluation Performed:  Follow-Up Visit  Chief Complaint:  CAD  History of Present Illness:    Sonya Small is a 70 y.o. female with PMH of HTN, HLD, OSA, PAD, CVA, chronic diastolic heart failureand CAD. Patient had a NSTEMI in March 2010 and underwent DES to left circumflex.She had anotherNSTEMI in May 2011 secondary to thrombotic RCA lesion, cardiac catheterization showed nonobstructive disease, medical therapy was recommended. She was admitted for recurrent chest pain in January 2014, Myoview was negative. She was admitted for acute CVA in October 2015 after presenting with slurred speech and facial drooping. MRI showed acute right MCA infarct involving basal ganglia and periventricular white matter. She was started on aspirin along with Plavix. Myoview obtained in August 2017 showed no ischemia, normal EF. She was last  seen by Dr. [name] in June 2018, she was very sedentary at the time and notes dyspnea on exertion with any activity. ABI obtained in July 2018 was normal.  She was seen by Almyra Deforest PA-C on 03/13/2018  with chest pain. A Myoview study on 03/25/2018 which showed EF 52%, small sized moderate intensity fixed apical perfusion defect likely attenuation artifact was seen, no reversible ischemia otherwise. Overall this is a low risk stress test.  Seen again on 05/22/2018, he was having a lot of dizziness. She was noted to be taking different doses of medication than what was prescribed. BP was stable. Polypharmacy with use of Xanax, oxycodone, Tizanidiine, Zoloft. Dizziness felt to be more related to medication than to a vascular issue. Carotid dopplers were checked and were OK.  She was admitted in May 2020 with PNA and respiratory failure requiring ventilator support. Treated with antibiotics and breathing treatments with improvement. Still smoking.  She has of neuropathy in her feet.  On Lyrica. Prior LE dopplers showed no significant PAD.   She was seen in the ED on 10/19/20 with acute mid thoracic pain. Started left scapula and moved to the right. She states Ntg did help. Troponin was normal x 2. Ecg showed mild ST depression in leads V5-6. CXR normal. Other labs normal. Pain lasted about 30 minutes.  Since then her pain has resolved. Just feels tired.   The patient does not have symptoms concerning for COVID-19 infection (fever, chills, cough, or new shortness of breath).    Past Medical History:  Diagnosis Date  . Anxiety   . CAD (coronary artery disease)    a. NSTEMI  11/2008 s/p DES to LCx (3.0x12 Xience); b. NSTEMI 01/2010 secondary to thrombotic RCA lesion (non-obstructive)-->med rx (integrilin x 24 hrs + plavix); c. 09/2012 negative Myoview.  . Chronic diastolic CHF (congestive heart failure) ([place])    a. 06/2014 Echo: EF 55-60%, no rwma, Gr1 DD, mild AI.  [name] Depression   . Dizziness   . Drug  induced constipation   . Generalized hyperhidrosis   . GERD (gastroesophageal reflux disease)   . Headache   . Hyperlipidemia   . Hypertensive heart disease   . Lumbar disc disease   . Metabolic encephalopathy   . Mixed hyperlipidemia   . Myocardial infarction ([place])   . Obstructive sleep apnea   . OP (osteoporosis)   . Osteoarthritis   . Osteoporosis   . Other malaise   . Overweight(278.02)   . PAD (peripheral artery disease) ([place])    a. Emboli to R foot 2010 from partially occlusive lesion in R EIA, s/p stenting. - followed by Dr. Donnetta Hutching;  b. 10/2015 ABIs: R 1.03, L 0.97.  [name] Restless leg   . Sleep apnea   . Stroke ([place])   . TIA (transient ischemic attack)   . Tobacco abuse   . Urge incontinence    Past Surgical History:  Procedure Laterality Date  . EYE SURGERY     at age 59  . hysterectomy -age 89    . ILIAC ARTERY STENT     RIGHT ILIAC STENT  . KNEE ARTHROSCOPY    . LUMBAR LAMINECTOMY    . TUBAL LIGATION       No outpatient medications have been marked as taking for the 10/21/20 encounter (Appointment) with [name], Jalayne Ganesh M, MD.     Allergies:   Patient has no known allergies.   Social History   Tobacco Use  . Smoking status: Current Every Day Smoker    Packs/day: 0.50    Types: Cigarettes    Last attempt to quit: 11/20/2012    Years since quitting: 7.9  . Smokeless tobacco: Never Used  Substance Use Topics  . Alcohol use: No    Alcohol/week: 0.0 standard drinks  . Drug use: No     Family Hx: The patient's family history includes Alzheimer's disease in her mother; Cancer in her brother; Depression in her brother; Diabetes in her brother; Heart disease in an other family member; Hepatitis C in her brother; Hypertension in her brother; Thyroid disease in her brother.  ROS:   Please see the history of present illness.    All other systems reviewed and are negative.   Prior CV studies:   The following studies were reviewed today:  Myoview 03/25/2018  The  left ventricular ejection fraction is mildly decreased (45-54%).  Nuclear stress EF: 52%.  No T wave inversion was noted during stress.  There was no ST segment deviation noted during stress.  Defect 1: There is a small defect of moderate severity.  This is a low risk study.  Small size, moderate intensity fixed apical perfusion defect, likely attenuation artifact. No reversible ischemia. LVEF 52% with normal wall motion. This is a low risk study  Labs/Other Tests and Data Reviewed:    EKG:  An ECG dated 10/19/20 was personally reviewed today and demonstrated:  NSR wiht mild ST depression V5-6  Recent Labs: 12/04/2019: TSH 0.619 10/03/2020: ALT 17; BUN 18; Creatinine, Ser 0.72; Hemoglobin 14.3; Platelets 257; Potassium 3.8; Sodium 145   Recent Lipid Panel Lab Results  Component Value Date/Time   CHOL  160 08/17/2020 10:31 AM   TRIG 150 (H) 08/17/2020 10:31 AM   HDL 41 08/17/2020 10:31 AM   CHOLHDL 3.9 08/17/2020 10:31 AM   CHOLHDL 5.0 07/13/2014 01:08 AM   LDLCALC 93 08/17/2020 10:31 AM    Wt Readings from Last 3 Encounters:  10/03/20 167 lb (75.8 kg)  08/17/20 164 lb (74.4 kg)  06/22/20 166 lb (75.3 kg)     Risk Assessment/Calculations:      Objective:    Vital Signs:  There were no vitals taken for this visit.   VITAL SIGNS:  reviewed  ASSESSMENT & PLAN:    1. CAD: really asymptomatic. Myoview in August 2019 was low risk.  Continue aspirin and Plavix. Recent thoracic pain atypical for angina. Suspect more musculoskeletal pain.   2. Hypertension: Blood pressure is well controlled.  3.   Chronic diastolic heart failure: no active symptoms  5.   Dyslipidemia. On Crestor and Vascepa.  6.   Tobacco abuse. Recommend smoking cessation.         COVID-19 Education: The signs and symptoms of COVID-19 were discussed with the patient and how to seek care for testing (follow up with PCP or arrange E-visit).  The importance of social distancing was discussed  today.  Time:   Today, I have spent 11 minutes with the patient with telehealth technology discussing the above problems.     Medication Adjustments/Labs and Tests Ordered: Current medicines are reviewed at length with the patient today.  Concerns regarding medicines are outlined above.   Tests Ordered: No orders of the defined types were placed in this encounter.   Medication Changes: No orders of the defined types were placed in this encounter.   Follow Up:  In a couple of months.  Signed, Paizley Ramella [name], MD  10/19/2020 1:12 PM    [facility]

## 2020-10-19 NOTE — Discharge Instructions (Addendum)
Your work-up in the ED today has been reassuring.  We recommend follow-up with your cardiologist as scheduled.  Return for new or concerning symptoms.

## 2020-10-20 NOTE — ED Provider Notes (Signed)
Attestation: Medical screening examination/treatment/procedure(s) were conducted as a shared visit with non-physician practitioner(s) and myself.  I personally evaluated the patient during the encounter.   Briefly, the patient is a 70 y.o. female with h/o CAD and NSTEMI (s/p DES to LCx, on Plavix), dCHF, HLD, HTN, PAD, right MCA stroke (2015), OSA, tobacco use presents to the ED for c/o back pain. Pain free for >12 hrs.  Vitals:   10/19/20 2330 10/19/20 2345  BP: (!) 154/68 (!) 142/57  Pulse: 74 76  Resp: 13 13  Temp:    SpO2: 95% 95%    CONSTITUTIONAL:  well-appearing, NAD NEURO:  Alert and oriented x 3, no focal deficits EYES:  pupils equal and reactive ENT/NECK:  trachea midline, no JVD CARDIO:  reg rate, reg rhythm, well-perfused PULM:   None labored breathing GI/GU:  Abdomen non-distended MSK/SPINE:  No gross deformities, no edema SKIN:  no rash, atraumatic PSYCH:  Appropriate speech and behavior   EKG Interpretation  Date/Time:  Wednesday October 19 2020 22:04:44 EST Ventricular Rate:  76 PR Interval:    QRS Duration: 92 QT Interval:  408 QTC Calculation: 459 R Axis:   49 Text Interpretation: Sinus rhythm Anterior infarct, old Minimal ST depression, lateral leads NO STEMI. Confirmed by Addison Lank [phone]) on 10/19/2020 11:23:09 PM       Work up not concerning for ACS.  Low suspicion for PE or dissection. Chest x-ray without evidence suggestive of pneumonia, pneumothorax, pneumomediastinum.  No abnormal contour of the mediastinum to suggest dissection. No evidence of acute injuries.  Likely MSK related process.  Has appt with Cardiology in 2 days.  The patient appears reasonably screened and/or stabilized for discharge and I doubt any other medical condition or other [facility] requiring further screening, evaluation, or treatment in the ED at this time prior to discharge. Safe for discharge with strict return precautions.        Fatima Blank, MD 10/20/20  [phone]

## 2020-10-21 ENCOUNTER — Telehealth: Payer: Self-pay

## 2020-10-21 ENCOUNTER — Encounter: Payer: Self-pay | Admitting: Cardiology

## 2020-10-21 ENCOUNTER — Telehealth: Payer: Self-pay | Admitting: *Deleted

## 2020-10-21 ENCOUNTER — Telehealth (INDEPENDENT_AMBULATORY_CARE_PROVIDER_SITE_OTHER): Payer: Medicare Other | Admitting: Cardiology

## 2020-10-21 VITALS — BP 110/71 | HR 83 | Ht 65.0 in | Wt 167.0 lb

## 2020-10-21 DIAGNOSIS — I10 Essential (primary) hypertension: Secondary | ICD-10-CM

## 2020-10-21 DIAGNOSIS — Z720 Tobacco use: Secondary | ICD-10-CM

## 2020-10-21 DIAGNOSIS — M549 Dorsalgia, unspecified: Secondary | ICD-10-CM

## 2020-10-21 DIAGNOSIS — I25119 Atherosclerotic heart disease of native coronary artery with unspecified angina pectoris: Secondary | ICD-10-CM | POA: Diagnosis not present

## 2020-10-21 DIAGNOSIS — I739 Peripheral vascular disease, unspecified: Secondary | ICD-10-CM

## 2020-10-21 NOTE — Telephone Encounter (Signed)
Transition Care Management Unsuccessful Follow-up Telephone Call  Date of discharge and from where:  [facility]  Attempts:  1st Attempt  Reason for unsuccessful TCM follow-up call:  Unable to reach patient

## 2020-10-21 NOTE — Telephone Encounter (Signed)
  Patient Consent for Virtual Visit         [facility] has provided verbal consent on 10/21/2020 for a virtual visit (video or telephone).   CONSENT FOR VIRTUAL VISIT FOR:  [facility]  By participating in this virtual visit I agree to the following:  I hereby voluntarily request, consent and authorize [facility] and its employed or contracted physicians, physician assistants, nurse practitioners or other licensed health care professionals (the Practitioner), to provide me with telemedicine health care services (the "Services") as deemed necessary by the treating Practitioner. I acknowledge and consent to receive the Services by the Practitioner via telemedicine. I understand that the telemedicine visit will involve communicating with the Practitioner through live audiovisual communication technology and the disclosure of certain medical information by electronic transmission. I acknowledge that I have been given the opportunity to request an in-person assessment or other available alternative prior to the telemedicine visit and am voluntarily participating in the telemedicine visit.  I understand that I have the right to withhold or withdraw my consent to the use of telemedicine in the course of my care at any time, without affecting my right to future care or treatment, and that the Practitioner or I may terminate the telemedicine visit at any time. I understand that I have the right to inspect all information obtained and/or recorded in the course of the telemedicine visit and may receive copies of available information for a reasonable fee.  I understand that some of the potential risks of receiving the Services via telemedicine include:  [name] Delay or interruption in medical evaluation due to technological equipment failure or disruption; . Information transmitted may not be sufficient (e.g. poor resolution of images) to allow for appropriate medical decision making by the Practitioner;  and/or  . In rare instances, security protocols could fail, causing a breach of personal health information.  Furthermore, I acknowledge that it is my responsibility to provide information about my medical history, conditions and care that is complete and accurate to the best of my ability. I acknowledge that Practitioner's advice, recommendations, and/or decision may be based on factors not within their control, such as incomplete or inaccurate data provided by me or distortions of diagnostic images or specimens that may result from electronic transmissions. I understand that the practice of medicine is not an exact science and that Practitioner makes no warranties or guarantees regarding treatment outcomes. I acknowledge that a copy of this consent can be made available to me via my patient portal ([facility]), or I can request a printed copy by calling the office of [facility].    I understand that my insurance will be billed for this visit.   I have read or had this consent read to me. . I understand the contents of this consent, which adequately explains the benefits and risks of the Services being provided via telemedicine.  . I have been provided ample opportunity to ask questions regarding this consent and the Services and have had my questions answered to my satisfaction. . I give my informed consent for the services to be provided through the use of telemedicine in my medical care

## 2020-10-21 NOTE — Patient Instructions (Signed)
Medication Instructions:  Continue same medications *If you need a refill on your cardiac medications before your next appointment, please call your pharmacy*   Lab Work: None ordered   Testing/Procedures: None ordered   Follow-Up: At [facility], you and your health needs are our priority.  As part of our continuing mission to provide you with exceptional heart care, we have created designated Provider Care Teams.  These Care Teams include your primary Cardiologist (physician) and Advanced Practice Providers (APPs -  Physician Assistants and Nurse Practitioners) who all work together to provide you with the care you need, when you need it.  We recommend signing up for the patient portal called "MyChart".  Sign up information is provided on this After Visit Summary.  MyChart is used to connect with patients for Virtual Visits (Telemedicine).  Patients are able to view lab/test results, encounter notes, upcoming appointments, etc.  Non-urgent messages can be sent to your provider as well.   To learn more about what you can do with MyChart, go to [email or web address].    Your next appointment:  Monday 12/19/20 at 1:40 pm   The format for your next appointment:  Office   Provider:  Dr.Jordan

## 2020-10-24 ENCOUNTER — Telehealth: Payer: Self-pay

## 2020-10-24 NOTE — Progress Notes (Signed)
Chronic Care Management Pharmacy Assistant   Name: [facility]  MRN: [id number] DOB: 02-Oct-1950  Reason for Encounter: Medication Review for acute delivery coordination   Patient Questions:  1.  Have you seen any other providers since your last visit?  10/19/20-ED- Patient was having some chest pains, she stated they found out she was having some back pain.  She took a Nitro before going to the ED.   2.  Any changes in your medicines or health? Patient having some back pain issues.     PCP : [name], MD  Allergies:  No Known Allergies  Medications: Outpatient Encounter Medications as of 10/24/2020  Medication Sig  . ALPRAZolam (XANAX) 0.5 MG tablet Take 1 tablet (0.5 mg total) by mouth 3 (three) times daily.  . ARIPiprazole (ABILIFY) 10 MG tablet Take 1 tablet (10 mg total) by mouth daily.  . Aspirin-Acetaminophen (GOODYS BODY PAIN PO) Take 1 packet by mouth daily.  . clopidogrel (PLAVIX) 75 MG tablet TAKE ONE TABLET BY MOUTH EVERY MORNING  . diclofenac Sodium (VOLTAREN) 1 % GEL   . fenofibrate 160 MG tablet TAKE ONE TABLET BY MOUTH EVERY MORNING  . fluticasone (FLONASE) 50 MCG/ACT nasal spray PLACE ONE SPRAY INTO BOTH NOSTRILS DAILY  . Fluticasone-Salmeterol,sensor, (AIRDUO DIGIHALER) 113-14 MCG/ACT AEPB Inhale 1 puff into the lungs in the morning and at bedtime.  [name] glucose blood (ONETOUCH VERIO) test strip USE DAILY TO CHECK YOUR BLOOD SUGAR (E11.69)  . lamoTRIgine (LAMICTAL) 25 MG tablet TAKE TWO TABLETS BY MOUTH EVERYDAY AT BEDTIME  . lansoprazole (PREVACID) 30 MG capsule TAKE ONE CAPSULE BY MOUTH EVERY MORNING  . lisinopril-hydrochlorothiazide (ZESTORETIC) 10-12.5 MG tablet TAKE ONE TABLET BY MOUTH EVERY MORNING  . nitroGLYCERIN (NITROSTAT) 0.4 MG SL tablet Place 1 tablet (0.4 mg total) under the tongue every 5 (five) minutes as needed.  [name] VERIO test strip USE DAILY TO CHECK YOUR BLOOD SUGAR (E11.69) Test 1 x daily  . oxyCODONE-acetaminophen (PERCOCET) 10-325  MG tablet Take 1 tablet by mouth every 6 (six) hours as needed.  . rosuvastatin (CRESTOR) 20 MG tablet TAKE ONE TABLET BY MOUTH EVERYDAY AT BEDTIME  . TRUEplus Lancets 30G MISC 1 each by Does not apply route 2 (two) times daily.  . TRULICITY 3 PY/1.9JK SOPN Inject 3 mg as directed once a week.  . valACYclovir (VALTREX) 1000 MG tablet TAKE TWO TABLETS BY MOUTH twice A DAY FOR ONE DAY as needed  . VASCEPA 1 g capsule TAKE TWO CAPSULES BY MOUTH EVERY MORNING and TAKE TWO TABLETS BY MOUTH EVERYDAY AT BEDTIME  . venlafaxine XR (EFFEXOR-XR) 75 MG 24 hr capsule Take 1 capsule (75 mg total) by mouth every morning.   No facility-administered encounter medications on file as of 10/24/2020.    Current Diagnosis: Patient Active Problem List   Diagnosis Date Noted  . Severe recurrent major depression without psychotic features ([place]) 05/09/2020  . Atypical pigmented skin lesion 05/09/2020  . Dizziness 02/07/2020  . Dermatitis 01/12/2020  . Mixed stress and urge urinary incontinence 12/25/2019  . Localized edema 12/18/2019  . Dry mouth 12/18/2019  . Diabetic polyneuropathy associated with type 2 diabetes mellitus ([place]) 12/16/2019  . Weakness 12/07/2019  . Ingrown toenail of both feet 12/07/2019  . Moderate recurrent major depression ([place]) 12/03/2019  . Cigarette nicotine dependence with nicotine-induced disorder 12/03/2019  . Type 2 diabetes mellitus with hypertriglyceridemia ([place]) 12/03/2019  . Hypertensive heart disease   . Oral thrush 10/28/2015  . Pituitary adenoma ([place])  02/22/2015  . Cerebral infarction due to thrombosis of left middle cerebral artery ([place]) 08/23/2014  . HLD (hyperlipidemia) 08/23/2014  . Essential hypertension 08/23/2014  . PVD (peripheral vascular disease) ([place]) 08/23/2014  . Former smoker 08/23/2014  . COPD (chronic obstructive pulmonary disease) ([place]) 07/13/2014  . OSA (obstructive sleep apnea) 07/13/2014  . Chronic diastolic heart failure ([place]) 07/13/2014  . Stroke  ([place]) 07/12/2014  . HTN (hypertension) 07/12/2014  . Peripheral vascular disease, unspecified ([place]) 02/17/2013  . Aftercare following surgery of the circulatory system, [place] 02/17/2013  . Chest pain 10/16/2012  . Coronary artery disease   . PAD (peripheral artery disease) ([place])   . Hyperlipidemia   . Tobacco abuse   . History of myocardial infarction 01/22/2010   Patient left message over the weekend on my voicemail and the voice mail of Margaretmary Dys, stating that she was out of medication.    I called her first thing and reminded her that we don't work on the weekends.  She stated she was out of several of her medications.  She apparently ran out sometime on Saturday, and had none for Sunday.    I went through her med list with her and filled out an acute form for her for delivery today.    Form was sent to [name], CPP for approval.  Follow-Up:  Coordination of Enhanced Pharmacy Services and Pharmacist Review  [name], CPP notified  Clarita Leber, [place] Pharmacist Assistant [phone]

## 2020-10-24 NOTE — Telephone Encounter (Signed)
Transition Care Management Unsuccessful Follow-up Telephone Call  Date of discharge and from where:  [facility]  Attempts:  2nd Attempt  Reason for unsuccessful TCM follow-up call:  Unable to reach patient

## 2020-10-24 NOTE — Progress Notes (Signed)
Chronic Care Management Pharmacy Assistant   Name: MANILA ROMMEL  MRN: [id number] DOB: 1951-07-24  Reason for Encounter: Medication Review for [facility] delivery and new sync date.    PCP : [name], MD  Allergies:  No Known Allergies  Medications: Outpatient Encounter Medications as of 10/24/2020  Medication Sig  . ALPRAZolam (XANAX) 0.5 MG tablet Take 1 tablet (0.5 mg total) by mouth 3 (three) times daily.  . ARIPiprazole (ABILIFY) 10 MG tablet Take 1 tablet (10 mg total) by mouth daily.  . Aspirin-Acetaminophen (GOODYS BODY PAIN PO) Take 1 packet by mouth daily.  . clopidogrel (PLAVIX) 75 MG tablet TAKE ONE TABLET BY MOUTH EVERY MORNING  . diclofenac Sodium (VOLTAREN) 1 % GEL   . fenofibrate 160 MG tablet TAKE ONE TABLET BY MOUTH EVERY MORNING  . fluticasone (FLONASE) 50 MCG/ACT nasal spray PLACE ONE SPRAY INTO BOTH NOSTRILS DAILY  . Fluticasone-Salmeterol,sensor, (AIRDUO DIGIHALER) 113-14 MCG/ACT AEPB Inhale 1 puff into the lungs in the morning and at bedtime.  [name] glucose blood (ONETOUCH VERIO) test strip USE DAILY TO CHECK YOUR BLOOD SUGAR (E11.69)  . lamoTRIgine (LAMICTAL) 25 MG tablet TAKE TWO TABLETS BY MOUTH EVERYDAY AT BEDTIME  . lansoprazole (PREVACID) 30 MG capsule TAKE ONE CAPSULE BY MOUTH EVERY MORNING  . lisinopril-hydrochlorothiazide (ZESTORETIC) 10-12.5 MG tablet TAKE ONE TABLET BY MOUTH EVERY MORNING  . nitroGLYCERIN (NITROSTAT) 0.4 MG SL tablet Place 1 tablet (0.4 mg total) under the tongue every 5 (five) minutes as needed.  [name] VERIO test strip USE DAILY TO CHECK YOUR BLOOD SUGAR (E11.69) Test 1 x daily  . oxyCODONE-acetaminophen (PERCOCET) 10-325 MG tablet Take 1 tablet by mouth every 6 (six) hours as needed.  . rosuvastatin (CRESTOR) 20 MG tablet TAKE ONE TABLET BY MOUTH EVERYDAY AT BEDTIME  . TRUEplus Lancets 30G MISC 1 each by Does not apply route 2 (two) times daily.  . TRULICITY 3 IW/9.7LG SOPN Inject 3 mg as directed once a week.  . valACYclovir  (VALTREX) 1000 MG tablet TAKE TWO TABLETS BY MOUTH twice A DAY FOR ONE DAY as needed  . VASCEPA 1 g capsule TAKE TWO CAPSULES BY MOUTH EVERY MORNING and TAKE TWO TABLETS BY MOUTH EVERYDAY AT BEDTIME  . venlafaxine XR (EFFEXOR-XR) 75 MG 24 hr capsule Take 1 capsule (75 mg total) by mouth every morning.   No facility-administered encounter medications on file as of 10/24/2020.    Current Diagnosis: Patient Active Problem List   Diagnosis Date Noted  . Severe recurrent major depression without psychotic features ([place]) 05/09/2020  . Atypical pigmented skin lesion 05/09/2020  . Dizziness 02/07/2020  . Dermatitis 01/12/2020  . Mixed stress and urge urinary incontinence 12/25/2019  . Localized edema 12/18/2019  . Dry mouth 12/18/2019  . Diabetic polyneuropathy associated with type 2 diabetes mellitus ([place]) 12/16/2019  . Weakness 12/07/2019  . Ingrown toenail of both feet 12/07/2019  . Moderate recurrent major depression ([place]) 12/03/2019  . Cigarette nicotine dependence with nicotine-induced disorder 12/03/2019  . Type 2 diabetes mellitus with hypertriglyceridemia ([place]) 12/03/2019  . Hypertensive heart disease   . Oral thrush 10/28/2015  . Pituitary adenoma ([place]) 02/22/2015  . Cerebral infarction due to thrombosis of left middle cerebral artery ([place]) 08/23/2014  . HLD (hyperlipidemia) 08/23/2014  . Essential hypertension 08/23/2014  . PVD (peripheral vascular disease) ([place]) 08/23/2014  . Former smoker 08/23/2014  . COPD (chronic obstructive pulmonary disease) ([place]) 07/13/2014  . OSA (obstructive sleep apnea) 07/13/2014  . Chronic diastolic heart failure ([place])  07/13/2014  . Stroke ([place]) 07/12/2014  . HTN (hypertension) 07/12/2014  . Peripheral vascular disease, unspecified ([place]) 02/17/2013  . Aftercare following surgery of the circulatory system, [place] 02/17/2013  . Chest pain 10/16/2012  . Coronary artery disease   . PAD (peripheral artery disease) ([place])   . Hyperlipidemia   . Tobacco  abuse   . History of myocardial infarction 01/22/2010    After speaking with patient, she has a lot of staggered deliveries and it has been difficult for [facility] to sync her medications.  I spoke with [name], CPP, and we decided to update her onboard form and sync date to reflect a 30DS of packs instead of 90ds.  [name], CPP, thinks this will help to cut down on the confusion and be more beneficial in patient adherence.    I have sent the updated forms to [name], CPP for approval  Follow-Up:  Coordination of Enhanced Pharmacy Services and Pharmacist Review  [name], CPP notified  Clarita Leber, [place] Pharmacist Assistant [phone]

## 2020-10-25 ENCOUNTER — Other Ambulatory Visit: Payer: Self-pay

## 2020-10-25 MED ORDER — TRULICITY 3 MG/0.5ML ~~LOC~~ SOAJ
3.0000 mg | SUBCUTANEOUS | 2 refills | Status: DC
Start: 2020-10-25 — End: 2021-03-15

## 2020-10-25 NOTE — Telephone Encounter (Signed)
Transition Care Management Unsuccessful Follow-up Telephone Call  Date of discharge and from where:  [facility]  Attempts:  3rd Attempt  Reason for unsuccessful TCM follow-up call:  Unable to reach patient

## 2020-10-26 ENCOUNTER — Telehealth: Payer: Self-pay

## 2020-10-26 NOTE — Chronic Care Management (AMB) (Signed)
Chronic Care Management Pharmacy Assistant   Name: [facility]  MRN: [id number] DOB: 1951/07/28  Reason for Encounter: Medication Review for delivery coordination   PCP : [name], MD  Allergies:  No Known Allergies  Medications: Outpatient Encounter Medications as of 10/26/2020  Medication Sig   ALPRAZolam (XANAX) 0.5 MG tablet Take 1 tablet (0.5 mg total) by mouth 3 (three) times daily.   ARIPiprazole (ABILIFY) 10 MG tablet Take 1 tablet (10 mg total) by mouth daily.   Aspirin-Acetaminophen (GOODYS BODY PAIN PO) Take 1 packet by mouth daily.   clopidogrel (PLAVIX) 75 MG tablet TAKE ONE TABLET BY MOUTH EVERY MORNING   diclofenac Sodium (VOLTAREN) 1 % GEL    Dulaglutide (TRULICITY) 3 HA/1.9FX SOPN Inject 3 mg as directed once a week.   fenofibrate 160 MG tablet TAKE ONE TABLET BY MOUTH EVERY MORNING   fluticasone (FLONASE) 50 MCG/ACT nasal spray PLACE ONE SPRAY INTO BOTH NOSTRILS DAILY   Fluticasone-Salmeterol,sensor, (AIRDUO DIGIHALER) 113-14 MCG/ACT AEPB Inhale 1 puff into the lungs in the morning and at bedtime.   glucose blood (ONETOUCH VERIO) test strip USE DAILY TO CHECK YOUR BLOOD SUGAR (E11.69)   lamoTRIgine (LAMICTAL) 25 MG tablet TAKE TWO TABLETS BY MOUTH EVERYDAY AT BEDTIME   lansoprazole (PREVACID) 30 MG capsule TAKE ONE CAPSULE BY MOUTH EVERY MORNING   lisinopril-hydrochlorothiazide (ZESTORETIC) 10-12.5 MG tablet TAKE ONE TABLET BY MOUTH EVERY MORNING   nitroGLYCERIN (NITROSTAT) 0.4 MG SL tablet Place 1 tablet (0.4 mg total) under the tongue every 5 (five) minutes as needed.   ONETOUCH VERIO test strip USE DAILY TO CHECK YOUR BLOOD SUGAR (E11.69) Test 1 x daily   oxyCODONE-acetaminophen (PERCOCET) 10-325 MG tablet Take 1 tablet by mouth every 6 (six) hours as needed.   rosuvastatin (CRESTOR) 20 MG tablet TAKE ONE TABLET BY MOUTH EVERYDAY AT BEDTIME   TRUEplus Lancets 30G MISC 1 each by Does not apply route 2 (two) times daily.   valACYclovir  (VALTREX) 1000 MG tablet TAKE TWO TABLETS BY MOUTH twice A DAY FOR ONE DAY as needed   VASCEPA 1 g capsule TAKE TWO CAPSULES BY MOUTH EVERY MORNING and TAKE TWO TABLETS BY MOUTH EVERYDAY AT BEDTIME   venlafaxine XR (EFFEXOR-XR) 75 MG 24 hr capsule Take 1 capsule (75 mg total) by mouth every morning.   No facility-administered encounter medications on file as of 10/26/2020.    Current Diagnosis: Patient Active Problem List   Diagnosis Date Noted   Severe recurrent major depression without psychotic features ([place]) 05/09/2020   Atypical pigmented skin lesion 05/09/2020   Dizziness 02/07/2020   Dermatitis 01/12/2020   Mixed stress and urge urinary incontinence 12/25/2019   Localized edema 12/18/2019   Dry mouth 12/18/2019   Diabetic polyneuropathy associated with type 2 diabetes mellitus ([place]) 12/16/2019   Weakness 12/07/2019   Ingrown toenail of both feet 12/07/2019   Moderate recurrent major depression ([place]) 12/03/2019   Cigarette nicotine dependence with nicotine-induced disorder 12/03/2019   Type 2 diabetes mellitus with hypertriglyceridemia ([place]) 12/03/2019   Hypertensive heart disease    Oral thrush 10/28/2015   Pituitary adenoma ([place]) 02/22/2015   Cerebral infarction due to thrombosis of left middle cerebral artery ([place]) 08/23/2014   HLD (hyperlipidemia) 08/23/2014   Essential hypertension 08/23/2014   PVD (peripheral vascular disease) ([place]) 08/23/2014   Former smoker 08/23/2014   COPD (chronic obstructive pulmonary disease) ([place]) 07/13/2014   OSA (obstructive sleep apnea) 07/13/2014   Chronic diastolic heart failure ([place]) 07/13/2014   Stroke (  [place]) 07/12/2014   HTN (hypertension) 07/12/2014   Peripheral vascular disease, unspecified ([place]) 02/17/2013   Aftercare following surgery of the circulatory system, [place] 02/17/2013   Chest pain 10/16/2012   Coronary artery disease    PAD (peripheral artery disease) ([place])    Hyperlipidemia    Tobacco  abuse    History of myocardial infarction 01/22/2010   Patient called and was upset with [facility] pharmacy.  She said they delivered her medication yesterday in vials.  She stated she had medication in vials from before.  I told her that when she called me the other day, she stated she had no meds, she was out completely.  So I put in an acute request to get her vials until we could sync her packaging.    She stated when she called [facility] they were rude and wouldn't listen to what she was telling them.  She stated they told her to "Listen", this upset her greatly.  She was upset they didn't bring her Trulicity,  [facility] said it would come on 11/03/20 when they delivered her packs of meds.    I reminded her to call and schedule a follow up with Dr. [name] to discuss her Aripiprazole 10mg  medication, she states she can't take it, all she wants to do is sleep and doesn't like it.    Follow-Up:  Pharmacist Review  [name], CPP notified  Clarita Leber, [place] Pharmacist Assistant [phone]

## 2020-10-26 NOTE — Progress Notes (Signed)
Virtual Visit via Telephone Note   This visit type was conducted due to national recommendations for restrictions regarding the COVID-19 Pandemic (e.g. social distancing) in an effort to limit this patient's exposure and mitigate transmission in our community.  Due to her co-morbid illnesses, this patient is at least at moderate risk for complications without adequate follow up.  This format is felt to be most appropriate for this patient at this time.  The patient did not have access to video technology/had technical difficulties with video requiring transitioning to audio format only (telephone).  All issues noted in this document were discussed and addressed.  No physical exam could be performed with this format.  Patient verbally consented to a telehealth visit.   Date:  11/13/2020   ID:  Sonya Small, DOB 30-Mar-1951, MRN [id number]  Patient Location: Home Provider Location: Office/Clinic  PCP:  [name], MD   Evaluation Performed:  Follow-Up Visit  Chief Complaint:  Depression  History of Present Illness:    Sonya Small is a 70 y.o. female following up on depression and diabetes.   The patient was started on abilify at her last visit for uncontrolled depression. She had to stop abilify last week. She became very drowsy, confused, and "goof" last week. States that she can not take abilify due to it making her very drowsy. Since stopping the abilify the patient's clarity has improved. She continues to be depressed.   Diabetes: Sugars running 106-134. Denies low sugars.   Last Tuesday the patient had severe thoracic back pain and took NT which helped resolve pain. She is scheduled to see cardiology this week.    Past Medical History:  Diagnosis Date  . Anxiety   . CAD (coronary artery disease)    a. NSTEMI 11/2008 s/p DES to LCx (3.0x12 Xience); b. NSTEMI 01/2010 secondary to thrombotic RCA lesion (non-obstructive)-->med rx (integrilin x 24 hrs + plavix); c. 09/2012 negative  Myoview.  . Chronic diastolic CHF (congestive heart failure) ([place])    a. 06/2014 Echo: EF 55-60%, no rwma, Gr1 DD, mild AI.  [name] Depression   . Dizziness   . Drug induced constipation   . Generalized hyperhidrosis   . GERD (gastroesophageal reflux disease)   . Headache   . Hyperlipidemia   . Hypertensive heart disease   . Lumbar disc disease   . Metabolic encephalopathy   . Mixed hyperlipidemia   . Myocardial infarction ([place])   . Obstructive sleep apnea   . OP (osteoporosis)   . Osteoarthritis   . Osteoporosis   . Other malaise   . Overweight(278.02)   . PAD (peripheral artery disease) ([place])    a. Emboli to R foot 2010 from partially occlusive lesion in R EIA, s/p stenting. - followed by Dr. Donnetta Hutching;  b. 10/2015 ABIs: R 1.03, L 0.97.  [name] Restless leg   . Sleep apnea   . Stroke ([place])   . TIA (transient ischemic attack)   . Tobacco abuse   . Urge incontinence     Past Surgical History:  Procedure Laterality Date  . EYE SURGERY     at age 68  . hysterectomy -age 7    . ILIAC ARTERY STENT     RIGHT ILIAC STENT  . KNEE ARTHROSCOPY    . LUMBAR LAMINECTOMY    . TUBAL LIGATION      Family History  Problem Relation Age of Onset  . Alzheimer's disease Mother   . Cancer Brother   . Heart  disease Other        Grandfather  . Hepatitis C Brother   . Diabetes Brother   . Thyroid disease Brother   . Hypertension Brother   . Depression Brother     Social History   Socioeconomic History  . Marital status: Divorced    Spouse name: Not on file  . Number of children: 3  . Years of education: 10 th  . Highest education level: Not on file  Occupational History  . Occupation: DISABLED    Employer: UNEMPLOYED  Tobacco Use  . Smoking status: Current Every Day Smoker    Packs/day: 0.50    Types: Cigarettes    Last attempt to quit: 11/20/2012    Years since quitting: 7.9  . Smokeless tobacco: Never Used  Substance and Sexual Activity  . Alcohol use: No    Alcohol/week: 0.0  standard drinks  . Drug use: No  . Sexual activity: Never  Other Topics Concern  . Not on file  Social History Narrative   Patient is single with 3 children, 1 deceased.   Patient is right handed.   Patient has 10 th grade education.   Patient drinks 5 or more cups daily.   Social Determinants of Health   Financial Resource Strain: Not on file  Food Insecurity: No Food Insecurity  . Worried About Charity fundraiser in the Last Year: Never true  . Ran Out of Food in the Last Year: Never true  Transportation Needs: No Transportation Needs  . Lack of Transportation (Medical): No  . Lack of Transportation (Non-Medical): No  Physical Activity: Not on file  Stress: Stress Concern Present  . Feeling of Stress : Rather much  Social Connections: Not on file  Intimate Partner Violence: Not on file    Outpatient Medications Prior to Visit  Medication Sig Dispense Refill  . Aspirin-Acetaminophen (GOODYS BODY PAIN PO) Take 1 packet by mouth daily.    . clopidogrel (PLAVIX) 75 MG tablet TAKE ONE TABLET BY MOUTH EVERY MORNING 90 tablet 2  . diclofenac Sodium (VOLTAREN) 1 % GEL     . Dulaglutide (TRULICITY) 3 IO/9.6EX SOPN Inject 3 mg as directed once a week. 4 mL 2  . fenofibrate 160 MG tablet TAKE ONE TABLET BY MOUTH EVERY MORNING 90 tablet 2  . fluticasone (FLONASE) 50 MCG/ACT nasal spray PLACE ONE SPRAY INTO BOTH NOSTRILS DAILY 16 g 1  . Fluticasone-Salmeterol,sensor, (AIRDUO DIGIHALER) 113-14 MCG/ACT AEPB Inhale 1 puff into the lungs in the morning and at bedtime.    [name] glucose blood (ONETOUCH VERIO) test strip USE DAILY TO CHECK YOUR BLOOD SUGAR (E11.69) 100 each 3  . lansoprazole (PREVACID) 30 MG capsule TAKE ONE CAPSULE BY MOUTH EVERY MORNING 90 capsule 1  . lisinopril-hydrochlorothiazide (ZESTORETIC) 10-12.5 MG tablet TAKE ONE TABLET BY MOUTH EVERY MORNING 90 tablet 2  . nitroGLYCERIN (NITROSTAT) 0.4 MG SL tablet Place 1 tablet (0.4 mg total) under the tongue every 5 (five) minutes as  needed. 25 tablet 1  . ONETOUCH VERIO test strip USE DAILY TO CHECK YOUR BLOOD SUGAR (E11.69) Test 1 x daily 100 each 3  . oxyCODONE-acetaminophen (PERCOCET) 10-325 MG tablet Take 1 tablet by mouth every 6 (six) hours as needed.    . rosuvastatin (CRESTOR) 20 MG tablet TAKE ONE TABLET BY MOUTH EVERYDAY AT BEDTIME 90 tablet 2  . TRUEplus Lancets 30G MISC 1 each by Does not apply route 2 (two) times daily. 100 each 2  . valACYclovir (VALTREX) 1000  MG tablet TAKE TWO TABLETS BY MOUTH twice A DAY FOR ONE DAY as needed 10 tablet 2  . VASCEPA 1 g capsule TAKE TWO CAPSULES BY MOUTH EVERY MORNING and TAKE TWO TABLETS BY MOUTH EVERYDAY AT BEDTIME 360 capsule 2  . venlafaxine XR (EFFEXOR-XR) 75 MG 24 hr capsule Take 1 capsule (75 mg total) by mouth every morning. 90 capsule 1  . ALPRAZolam (XANAX) 0.5 MG tablet Take 1 tablet (0.5 mg total) by mouth 3 (three) times daily. 90 tablet 0  . ARIPiprazole (ABILIFY) 10 MG tablet Take 1 tablet (10 mg total) by mouth daily. 30 tablet 2  . lamoTRIgine (LAMICTAL) 25 MG tablet TAKE TWO TABLETS BY MOUTH EVERYDAY AT BEDTIME 180 tablet 1   No facility-administered medications prior to visit.    Allergies:   Abilify [aripiprazole]   Social History   Tobacco Use  . Smoking status: Current Every Day Smoker    Packs/day: 0.50    Types: Cigarettes    Last attempt to quit: 11/20/2012    Years since quitting: 7.9  . Smokeless tobacco: Never Used  Substance Use Topics  . Alcohol use: No    Alcohol/week: 0.0 standard drinks  . Drug use: No     Review of Systems  Constitutional: Negative for chills, fever and malaise/fatigue.  HENT: Negative for congestion, ear pain, sinus pain and sore throat.   Respiratory: Positive for cough (Baseline). Negative for shortness of breath.   Cardiovascular: Negative for chest pain.  Musculoskeletal: Negative for joint pain and myalgias.  Neurological: Negative for headaches.     Labs/Other Tests and Data Reviewed:    Recent  Labs: 12/04/2019: TSH 0.619 10/19/2020: ALT 19; BUN 16; Creatinine, Ser 0.70; Hemoglobin 13.9; Platelets 239; Potassium 3.6; Sodium 143   Hypertension. Patient's bp has been between 124-131/80s. Pulse 70-80s.  Recent Lipid Panel Lab Results  Component Value Date/Time   CHOL 160 08/17/2020 10:31 AM   TRIG 150 (H) 08/17/2020 10:31 AM   HDL 41 08/17/2020 10:31 AM   CHOLHDL 3.9 08/17/2020 10:31 AM   CHOLHDL 5.0 07/13/2014 01:08 AM   LDLCALC 93 08/17/2020 10:31 AM    Wt Readings from Last 3 Encounters:  10/27/20 163 lb (73.9 kg)  10/21/20 167 lb (75.8 kg)  10/03/20 167 lb (75.8 kg)     Objective:    Vital Signs:  BP 124/84   Pulse 78   Temp (!) 97.1 F (36.2 C)   Wt 163 lb (73.9 kg)   BMI 27.12 kg/m    Physical Exam   ASSESSMENT & PLAN:   1. Type 2 diabetes mellitus with hypertriglyceridemia (HCC) Well controlled. Continue current medications  2. Severe episode of recurrent major depressive disorder, without psychotic features ([place]) Increase lamictal to 100 mg once at [employer]. Continue Effexor xr at current dose.   3. Hypertensive heart disease with chronic combined systolic and diastolic congestive heart failure (HCC)  BP is at goal. Recommend keep appointment with cardiology. If pain recurs, pt should go to the ED.    Meds ordered this encounter  Medications  . lamoTRIgine (LAMICTAL) 100 MG tablet    Sig: Take 1 tablet (100 mg total) by mouth at bedtime.    Dispense:  30 tablet    Refill:  1    This prescription was filled on 06/21/2020. Any refills authorized will be placed on file.   I spent 22 minutes dedicated to the care of this patient on the date of this encounter. Follow Up:  In  Person in 4 week(s)  Signed,  [name], MD  11/13/2020 6:29 PM    [place]

## 2020-10-27 ENCOUNTER — Telehealth (INDEPENDENT_AMBULATORY_CARE_PROVIDER_SITE_OTHER): Payer: Medicare Other | Admitting: Family Medicine

## 2020-10-27 VITALS — BP 124/84 | HR 78 | Temp 97.1°F | Wt 163.0 lb

## 2020-10-27 DIAGNOSIS — E781 Pure hyperglyceridemia: Secondary | ICD-10-CM | POA: Diagnosis not present

## 2020-10-27 DIAGNOSIS — F332 Major depressive disorder, recurrent severe without psychotic features: Secondary | ICD-10-CM | POA: Diagnosis not present

## 2020-10-27 DIAGNOSIS — I5042 Chronic combined systolic (congestive) and diastolic (congestive) heart failure: Secondary | ICD-10-CM

## 2020-10-27 DIAGNOSIS — I110 Hypertensive heart disease with heart failure: Secondary | ICD-10-CM | POA: Diagnosis not present

## 2020-10-27 DIAGNOSIS — E1169 Type 2 diabetes mellitus with other specified complication: Secondary | ICD-10-CM

## 2020-10-27 MED ORDER — LAMOTRIGINE 100 MG PO TABS: 100.0000 mg | ORAL_TABLET | Freq: Every evening | ORAL | 1 refills | Status: DC

## 2020-11-01 ENCOUNTER — Telehealth: Payer: Self-pay

## 2020-11-01 NOTE — Progress Notes (Signed)
Chronic Care Management Pharmacy Assistant   Name: Sonya Small  MRN: [id number] DOB: 01-02-1951  Reason for Encounter: Medication Review for medication cost   PCP : Sonya Brome, MD  Allergies:  No Known Allergies  Medications: Outpatient Encounter Medications as of 11/01/2020  Medication Sig  . ALPRAZolam (XANAX) 0.5 MG tablet Take 1 tablet (0.5 mg total) by mouth 3 (three) times daily.  . ARIPiprazole (ABILIFY) 10 MG tablet Take 1 tablet (10 mg total) by mouth daily.  . Aspirin-Acetaminophen (GOODYS BODY PAIN PO) Take 1 packet by mouth daily.  . clopidogrel (PLAVIX) 75 MG tablet TAKE ONE TABLET BY MOUTH EVERY MORNING  . diclofenac Sodium (VOLTAREN) 1 % GEL   . Dulaglutide (TRULICITY) 3 YB/6.3SL SOPN Inject 3 mg as directed once a week.  . fenofibrate 160 MG tablet TAKE ONE TABLET BY MOUTH EVERY MORNING  . fluticasone (FLONASE) 50 MCG/ACT nasal spray PLACE ONE SPRAY INTO BOTH NOSTRILS DAILY  . Fluticasone-Salmeterol,sensor, (AIRDUO DIGIHALER) 113-14 MCG/ACT AEPB Inhale 1 puff into the lungs in the morning and at bedtime.  [name] glucose blood (ONETOUCH VERIO) test strip USE DAILY TO CHECK YOUR BLOOD SUGAR (E11.69)  . lamoTRIgine (LAMICTAL) 100 MG tablet Take 1 tablet (100 mg total) by mouth at bedtime.  . lansoprazole (PREVACID) 30 MG capsule TAKE ONE CAPSULE BY MOUTH EVERY MORNING  . lisinopril-hydrochlorothiazide (ZESTORETIC) 10-12.5 MG tablet TAKE ONE TABLET BY MOUTH EVERY MORNING  . nitroGLYCERIN (NITROSTAT) 0.4 MG SL tablet Place 1 tablet (0.4 mg total) under the tongue every 5 (five) minutes as needed.  [name] VERIO test strip USE DAILY TO CHECK YOUR BLOOD SUGAR (E11.69) Test 1 x daily  . oxyCODONE-acetaminophen (PERCOCET) 10-325 MG tablet Take 1 tablet by mouth every 6 (six) hours as needed.  . rosuvastatin (CRESTOR) 20 MG tablet TAKE ONE TABLET BY MOUTH EVERYDAY AT BEDTIME  . TRUEplus Lancets 30G MISC 1 each by Does not apply route 2 (two) times daily.  . valACYclovir  (VALTREX) 1000 MG tablet TAKE TWO TABLETS BY MOUTH twice A DAY FOR ONE DAY as needed  . VASCEPA 1 g capsule TAKE TWO CAPSULES BY MOUTH EVERY MORNING and TAKE TWO TABLETS BY MOUTH EVERYDAY AT BEDTIME  . venlafaxine XR (EFFEXOR-XR) 75 MG 24 hr capsule Take 1 capsule (75 mg total) by mouth every morning.   No facility-administered encounter medications on file as of 11/01/2020.    Current Diagnosis: Patient Active Problem List   Diagnosis Date Noted  . Severe recurrent major depression without psychotic features ([place]) 05/09/2020  . Atypical pigmented skin lesion 05/09/2020  . Dizziness 02/07/2020  . Dermatitis 01/12/2020  . Mixed stress and urge urinary incontinence 12/25/2019  . Localized edema 12/18/2019  . Dry mouth 12/18/2019  . Diabetic polyneuropathy associated with type 2 diabetes mellitus ([place]) 12/16/2019  . Weakness 12/07/2019  . Ingrown toenail of both feet 12/07/2019  . Moderate recurrent major depression ([place]) 12/03/2019  . Cigarette nicotine dependence with nicotine-induced disorder 12/03/2019  . Type 2 diabetes mellitus with hypertriglyceridemia ([place]) 12/03/2019  . Hypertensive heart disease   . Oral thrush 10/28/2015  . Pituitary adenoma ([place]) 02/22/2015  . Cerebral infarction due to thrombosis of left middle cerebral artery ([place]) 08/23/2014  . HLD (hyperlipidemia) 08/23/2014  . Essential hypertension 08/23/2014  . PVD (peripheral vascular disease) ([place]) 08/23/2014  . Former smoker 08/23/2014  . COPD (chronic obstructive pulmonary disease) ([place]) 07/13/2014  . OSA (obstructive sleep apnea) 07/13/2014  . Chronic diastolic heart failure ([place]) 07/13/2014  .  Stroke ([place]) 07/12/2014  . HTN (hypertension) 07/12/2014  . Peripheral vascular disease, unspecified ([place]) 02/17/2013  . Aftercare following surgery of the circulatory system, [place] 02/17/2013  . Chest pain 10/16/2012  . Coronary artery disease   . PAD (peripheral artery disease) ([place])   . Hyperlipidemia   . Tobacco  abuse   . History of myocardial infarction 01/22/2010   Patient called and wanted to know why she is having to pay for her medications she has with [facility] since first of 2022  I called [facility] to inquire what was going on, she first came to [place] in June, and most likely her deductible had been met, so there was no charge to her.  I called the patient and explained that the cost was most likely due to her not meeting her deductible yet for 2022, reminded her that she came to [place] in in June so most likely she had already met it when she came to [facility].  I encouraged her to check with her insurance company.    She said she was due for a delivery today around 4pm, and would let me know if it did not arrive on time.    Follow-Up:  Pharmacist Review  Sonya Small, CPP notified  Sonya Small, [place] Pharmacist Assistant [phone]

## 2020-11-03 ENCOUNTER — Other Ambulatory Visit: Payer: Self-pay

## 2020-11-03 DIAGNOSIS — R5383 Other fatigue: Secondary | ICD-10-CM

## 2020-11-03 MED ORDER — ALPRAZOLAM 0.5 MG PO TABS
0.5000 mg | ORAL_TABLET | Freq: Three times a day (TID) | ORAL | 0 refills | Status: DC
Start: 2020-11-03 — End: 2020-11-25

## 2020-11-10 ENCOUNTER — Telehealth: Payer: Medicare Other

## 2020-11-10 ENCOUNTER — Telehealth: Payer: Self-pay

## 2020-11-10 NOTE — Telephone Encounter (Signed)
[name] is going to call the pain clinic for the order and she will schedule Sonya Small a nurse visit.

## 2020-11-10 NOTE — Telephone Encounter (Signed)
Dr. [name] approved for the drug screen to be done at our office.  She recommended that the pain clinic send the order to the office and for [facility] to schedule Aunya to come in for the test.

## 2020-11-10 NOTE — Progress Notes (Deleted)
Chronic Care Management Pharmacy Note  11/10/2020 Name:  Sonya Small MRN:  [id number] DOB:  [birth year]  Subjective: Sonya Small is an 70 y.o. year old female who is a primary patient of [name], [name], MD.  The CCM team was consulted for assistance with disease management and care coordination needs.    Engaged with patient by telephone for follow up visit in response to provider referral for pharmacy case management and/or care coordination services.   Consent to Services:  The patient was given information about Chronic Care Management services, agreed to services, and gave verbal consent prior to initiation of services.  Please see initial visit note for detailed documentation.   Patient Care Team: [name], MD as PCP - General (Family Medicine) [name], [name], MD as PCP - Cardiology (Cardiology) Burn[place] Logan, [facility] as Pharmacist (Pharmacist)  Recent office visits: 10/27/2020 - lamotrigine 100 mg daily instead of abilify.   Recent consult visits: 10/21/2020 - Cardio - recommend smoking cessation. Asymptomatic CAD.  10/19/2020 - Cardio - upper back/shoulder pain for 2 weeks. Relieve nitroglycerin advised to go to ED.   Hospital visits: 10/19/2020 - ED - ruled out cardiac cause.   Objective:  Lab Results  Component Value Date   CREATININE 0.70 10/19/2020   BUN 16 10/19/2020   GFRNONAA >60 10/19/2020   GFRAA 97 10/03/2020   NA 143 10/19/2020   K 3.6 10/19/2020   CALCIUM 9.0 10/19/2020   CO2 27 10/19/2020    Lab Results  Component Value Date/Time   HGBA1C 6.6 (H) 08/17/2020 10:31 AM   HGBA1C 7.4 (H) 05/09/2020 11:16 AM   MICROALBUR 30 12/04/2019 10:43 AM    Last diabetic Eye exam: No results found for: HMDIABEYEEXA  Last diabetic Foot exam: No results found for: HMDIABFOOTEX   Lab Results  Component Value Date   CHOL 160 08/17/2020   HDL 41 08/17/2020   LDLCALC 93 08/17/2020   TRIG 150 (H) 08/17/2020   CHOLHDL 3.9 08/17/2020    Hepatic Function Latest  Ref Rng & Units 10/19/2020 10/03/2020 08/17/2020  Total Protein 6.5 - 8.1 g/dL 6.3(L) 6.6 6.5  Albumin 3.5 - 5.0 g/dL 3.7 4.1 4.4  AST 15 - 41 U/L 25 18 16   ALT 0 - 44 U/L 19 17 16   Alk Phosphatase 38 - 126 U/L 50 85 59  Total Bilirubin 0.3 - 1.2 mg/dL 0.5 <0.2 0.2  Bilirubin, Direct 0.0 - 0.2 mg/dL <0.1 - -    Lab Results  Component Value Date/Time   TSH 0.619 12/04/2019 10:20 AM   TSH 1.130 07/13/2014 01:08 AM    CBC Latest Ref Rng & Units 10/19/2020 10/03/2020 08/17/2020  WBC 4.0 - 10.5 K/uL 7.2 8.2 10.0  Hemoglobin 12.0 - 15.0 g/dL 13.9 14.3 14.8  Hematocrit 36.0 - 46.0 % 44.1 44.5 43.9  Platelets 150 - 400 K/uL 239 257 281    No results found for: VD25OH  Clinical ASCVD: Yes  The ASCVD Risk score Mikey Bussing DC Jr., et al., 2013) failed to calculate for the following reasons:   The patient has a prior MI or stroke diagnosis    Depression screen [facility] 2/9 08/17/2020 08/10/2020 05/09/2020  Decreased Interest 3 3 3   Down, Depressed, Hopeless 3 3 3   PHQ - 2 Score 6 6 6   Altered sleeping 3 3 2   Tired, decreased energy 3 3 3   Change in appetite 2 1 2   Feeling bad or failure about yourself  3 2 3   Trouble concentrating  3 3 3   Moving slowly or fidgety/restless 3 3 3   Suicidal thoughts 1 0 0  PHQ-9 Score 24 21 22   Difficult doing work/chores - - Extremely dIfficult       Social History   Tobacco Use  Smoking Status Current Every Day Smoker  . Packs/day: 0.50  . Types: Cigarettes  . Last attempt to quit: 11/20/2012  . Years since quitting: 7.9  Smokeless Tobacco Never Used   BP Readings from Last 3 Encounters:  10/27/20 124/84  10/21/20 110/71  10/20/20 (!) 133/51   Pulse Readings from Last 3 Encounters:  10/27/20 78  10/21/20 83  10/20/20 77   Wt Readings from Last 3 Encounters:  10/27/20 163 lb (73.9 kg)  10/21/20 167 lb (75.8 kg)  10/03/20 167 lb (75.8 kg)    Assessment/Interventions: Review of patient past medical history, allergies, medications, health  status, including review of consultants reports, laboratory and other test data, was performed as part of comprehensive evaluation and provision of chronic care management services.   SDOH:  (Social Determinants of Health) assessments and interventions performed: Yes   CCM Care Plan  No Known Allergies  Medications Reviewed Today    Reviewed by Luanna Salk, LPN (Licensed Practical Nurse) on 10/21/20 at [place] List Status: <None>  Medication Order Taking? Sig Documenting Provider Last Dose Status Informant  ALPRAZolam (XANAX) 0.5 MG tablet [id number] Yes Take 1 tablet (0.5 mg total) by mouth 3 (three) times daily. [name], [name], MD Taking Active   ARIPiprazole (ABILIFY) 10 MG tablet [id number] Yes Take 1 tablet (10 mg total) by mouth daily. [name], MD Taking Active   Aspirin-Acetaminophen (GOODYS BODY PAIN PO) [id number] Yes Take 1 packet by mouth daily. [provider] Taking Active   clopidogrel (PLAVIX) 75 MG tablet [id number] Yes TAKE ONE TABLET BY MOUTH EVERY MORNING Marge Duncans, PA-C Taking Active   diclofenac Sodium (VOLTAREN) 1 % GEL [id number] Yes  [provider] Taking Active   fenofibrate 160 MG tablet [id number] Yes TAKE ONE TABLET BY MOUTH EVERY MORNING Marge Duncans, PA-C Taking Active   fluticasone (FLONASE) 50 MCG/ACT nasal spray [id number] Yes PLACE ONE SPRAY INTO BOTH NOSTRILS DAILY [name], [name], MD Taking Active   Fluticasone-Salmeterol,sensor, (AIRDUO [facility]) 113-14 MCG/ACT AEPB [id number] Yes Inhale 1 puff into the lungs in the morning and at bedtime. [provider] Taking Active   glucose blood (ONETOUCH VERIO) test strip [id number] Yes USE DAILY TO CHECK YOUR BLOOD SUGAR (E11.69) [name], [name], MD Taking Active   lamoTRIgine (LAMICTAL) 25 MG tablet [id number] Yes TAKE TWO TABLETS BY MOUTH EVERYDAY AT BEDTIME [name], [name], MD Taking Active   lansoprazole (PREVACID) 30 MG capsule [id number] Yes TAKE ONE CAPSULE BY MOUTH EVERY MORNING [name],  [name], MD Taking Active   lisinopril-hydrochlorothiazide (ZESTORETIC) 10-12.5 MG tablet [id number] Yes TAKE ONE TABLET BY MOUTH EVERY MORNING Marge Duncans, PA-C Taking Active   nitroGLYCERIN (NITROSTAT) 0.4 MG SL tablet [id number] Yes Place 1 tablet (0.4 mg total) under the tongue every 5 (five) minutes as needed. [name], MD Taking Active   [facility] VERIO test strip [id number] Yes USE DAILY TO CHECK YOUR BLOOD SUGAR (E11.69) Test 1 x daily [name], [name], MD Taking Active   oxyCODONE-acetaminophen (PERCOCET) 10-325 MG tablet [id number] Yes Take 1 tablet by mouth every 6 (six) hours as needed. [provider] Taking Active   rosuvastatin (CRESTOR) 20 MG tablet [id number] Yes TAKE ONE TABLET BY MOUTH EVERYDAY AT BEDTIME Marge Duncans, PA-C Taking Active  TRUEplus Lancets [place] [id number] Yes 1 each by Does not apply route 2 (two) times daily. [name], MD Taking Active   TRULICITY 3 KZ/9.9JT Bonney Aid [id number] Yes Inject 3 mg as directed once a week. Marge Duncans, PA-C Taking Active   valACYclovir (VALTREX) 1000 MG tablet [id number] Yes TAKE TWO TABLETS BY MOUTH twice A DAY FOR ONE DAY as needed [name], Elnita Maxwell, MD Taking Active   VASCEPA 1 g capsule [id number] Yes TAKE TWO CAPSULES BY MOUTH EVERY MORNING and TAKE TWO TABLETS BY MOUTH EVERYDAY AT BEDTIME Marge Duncans, PA-C Taking Active   venlafaxine XR (EFFEXOR-XR) 75 MG 24 hr capsule [id number] Yes Take 1 capsule (75 mg total) by mouth every morning. [name], MD Taking Active           Patient Active Problem List   Diagnosis Date Noted  . Severe recurrent major depression without psychotic features ([place]) 05/09/2020  . Atypical pigmented skin lesion 05/09/2020  . Dizziness 02/07/2020  . Dermatitis 01/12/2020  . Mixed stress and urge urinary incontinence 12/25/2019  . Localized edema 12/18/2019  . Dry mouth 12/18/2019  . Diabetic polyneuropathy associated with type 2 diabetes mellitus ([place]) 12/16/2019  . Weakness 12/07/2019  . Ingrown  toenail of both feet 12/07/2019  . Moderate recurrent major depression ([place]) 12/03/2019  . Cigarette nicotine dependence with nicotine-induced disorder 12/03/2019  . Type 2 diabetes mellitus with hypertriglyceridemia ([place]) 12/03/2019  . Hypertensive heart disease   . Oral thrush 10/28/2015  . Pituitary adenoma ([place]) 02/22/2015  . Cerebral infarction due to thrombosis of left middle cerebral artery ([place]) 08/23/2014  . HLD (hyperlipidemia) 08/23/2014  . Essential hypertension 08/23/2014  . PVD (peripheral vascular disease) ([place]) 08/23/2014  . Former smoker 08/23/2014  . COPD (chronic obstructive pulmonary disease) ([place]) 07/13/2014  . OSA (obstructive sleep apnea) 07/13/2014  . Chronic diastolic heart failure ([place]) 07/13/2014  . Stroke ([place]) 07/12/2014  . HTN (hypertension) 07/12/2014  . Peripheral vascular disease, unspecified ([place]) 02/17/2013  . Aftercare following surgery of the circulatory system, [place] 02/17/2013  . Chest pain 10/16/2012  . Coronary artery disease   . PAD (peripheral artery disease) ([place])   . Hyperlipidemia   . Tobacco abuse   . History of myocardial infarction 01/22/2010    Immunization History  Administered Date(s) Administered  . Fluad Quad(high Dose 65+) 08/29/2020  . Influenza,inj,Quad PF,6+ Mos 07/13/2014  . Influenza-Unspecified 05/29/2019  . Moderna Sars-Covid-2 Vaccination 07/11/2020, 08/01/2020  . Pneumococcal Conjugate-13 01/20/2015  . Pneumococcal Polysaccharide-23 06/26/2011    Conditions to be addressed/monitored:  Hypertension, Hyperlipidemia, Diabetes, Heart Failure, Coronary Artery Disease and COPD  There are no care plans that you recently modified to display for this patient.    Medication Assistance: None required.  Patient affirms current coverage meets needs.  Patient's preferred pharmacy is:  [id number] [place], [place]Phone: [phone]:  [phone] [facility] [place] [address] [place]: [phone]: [phone] [place]: [phone]: [phone] [id number] [place]: [phone] Fax: [phone] pill box? Yes Pt endorses good compliance  We discussed: Benefits of medication synchronization, packaging and delivery as well  as enhanced pharmacist oversight with [facility]. Patient decided to: Utilize [facility] pharmacy for medication synchronization, packaging and delivery  Care Plan and Follow Up Patient Decision:  Patient agrees to Care Plan and Follow-up.  Plan: Telephone follow up appointment with care management team member scheduled for:  ***  ***    Current Barriers:  . Unable to achieve control of ***  . ***  Pharmacist Clinical Goal(s):  [name] Over the next 90 days, patient will maintain control of *** as evidenced by ***  through collaboration with PharmD and provider.  . ***  Interventions: . 1:1 collaboration with [name], MD regarding development and update of comprehensive plan of care as evidenced by provider attestation and co-signature . Inter-disciplinary care team collaboration (see longitudinal plan of care) . Comprehensive medication review performed; medication list updated in electronic medical record  Hypertension (BP goal <130/80) -{CHL Controlled/Uncontrolled:[phone]} -Current treatment: . Lisinopril-hydrochlorothiazide 10-12.5 mg every morning -Medications previously tried: ***  -Current home readings: *** -Current dietary habits: *** -Current exercise habits: *** -{ACTIONS;DENIES/REPORTS:21021675::"Denies"} hypotensive/hypertensive symptoms -Educated on {CCM BP  Counseling:25124} -Counseled to monitor BP at home ***, document, and provide log at future appointments -{CCMPHARMDINTERVENTION:25122}  Hyperlipidemia/CAD: (LDL goal < ***) -{CHL Controlled/Uncontrolled:[phone]} -Current treatment: . ***rosuvastatin 20 mg daily at bedtime  . Fenofibrate 160 mg daily am  . Plavix 75 mg daily  . Vascepa 1 gram 2 capsules bid  -Medications previously tried: ***  -Current dietary patterns: *** -Current exercise habits: *** -Educated on {CCM HLD Counseling:25126} -{CCMPHARMDINTERVENTION:25122}  Diabetes (A1c goal {A1c goals:23924}) -{CHL Controlled/Uncontrolled:[phone]} -Current medications: . Onetouch Verio . Trulicity 3 mg weekly *** -Medications previously tried: ***  -Current home glucose readings . fasting glucose: *** . post prandial glucose: *** -{ACTIONS;DENIES/REPORTS:21021675::"Denies"} hypoglycemic/hyperglycemic symptoms -Current meal patterns:  . breakfast: ***  . lunch: ***  . dinner: *** . snacks: *** . drinks: *** -Current exercise: *** -Educated on{CCM DM COUNSELING:25123} -Counseled to check feet daily and get yearly eye exams -{CCMPHARMDINTERVENTION:25122}  Osteoporosis / Osteopenia (Goal ***) -{CHL Controlled/Uncontrolled:[phone]} -Last DEXA Scan: ***   T-Score femoral neck: -2.3  T-Score lumbar spine: -2.1  10-year probability of major osteoporotic fracture: 12.4%  10-year probability of hip fracture: 2.4% -Patient {is;is not an osteoporosis candidate:23886} -Current treatment  . *** -Medications previously tried: ***  -{Osteoporosis Counseling:23892} -{CCMPHARMDINTERVENTION:25122}   Patient Goals/Self-Care Activities . Over the next 90 days, patient will:  - take medications as prescribed check glucose daily, document, and provide at future appointments check blood pressure daily, document, and provide at future appointments  Follow Up Plan: Telephone follow up appointment with care management team  member scheduled for:

## 2020-11-10 NOTE — Telephone Encounter (Signed)
Roza, emergency contact, calling questioning if we can do drug screening. [name] states pt's back dr in [place] needs drug screen but does not have appointment with them soon. She would like to do it here if she can. Is this something we can do? Nurse visit or office visit if we can?

## 2020-11-13 ENCOUNTER — Encounter: Payer: Self-pay | Admitting: Family Medicine

## 2020-11-14 ENCOUNTER — Ambulatory Visit: Payer: Medicare Other

## 2020-11-14 DIAGNOSIS — G8929 Other chronic pain: Secondary | ICD-10-CM

## 2020-11-14 DIAGNOSIS — M545 Low back pain, unspecified: Secondary | ICD-10-CM

## 2020-11-15 ENCOUNTER — Other Ambulatory Visit: Payer: Self-pay

## 2020-11-15 ENCOUNTER — Ambulatory Visit (INDEPENDENT_AMBULATORY_CARE_PROVIDER_SITE_OTHER): Payer: Medicare Other

## 2020-11-15 DIAGNOSIS — E1169 Type 2 diabetes mellitus with other specified complication: Secondary | ICD-10-CM

## 2020-11-15 DIAGNOSIS — I5042 Chronic combined systolic (congestive) and diastolic (congestive) heart failure: Secondary | ICD-10-CM

## 2020-11-15 DIAGNOSIS — E782 Mixed hyperlipidemia: Secondary | ICD-10-CM | POA: Diagnosis not present

## 2020-11-15 DIAGNOSIS — I110 Hypertensive heart disease with heart failure: Secondary | ICD-10-CM

## 2020-11-15 DIAGNOSIS — E781 Pure hyperglyceridemia: Secondary | ICD-10-CM | POA: Diagnosis not present

## 2020-11-15 LAB — PAIN MGT SCRN (14 DRUGS), UR
Amphetamine Scrn, Ur: NEGATIVE ng/mL
BARBITURATE SCREEN URINE: NEGATIVE ng/mL
BENZODIAZEPINE SCREEN, URINE: POSITIVE ng/mL — AB
Buprenorphine, Urine: NEGATIVE ng/mL
CANNABINOIDS UR QL SCN: NEGATIVE ng/mL
Cocaine (Metab) Scrn, Ur: NEGATIVE ng/mL
Creatinine(Crt), U: 44.2 mg/dL (ref 20.0–300.0)
Fentanyl, Urine: NEGATIVE pg/mL
Meperidine Screen, Urine: NEGATIVE ng/mL
Methadone Screen, Urine: NEGATIVE ng/mL
OXYCODONE+OXYMORPHONE UR QL SCN: POSITIVE ng/mL — AB
Opiate Scrn, Ur: NEGATIVE ng/mL
Ph of Urine: 5.5 (ref 4.5–8.9)
Phencyclidine Qn, Ur: NEGATIVE ng/mL
Propoxyphene Scrn, Ur: NEGATIVE ng/mL
Tramadol Screen, Urine: NEGATIVE ng/mL

## 2020-11-15 NOTE — Progress Notes (Signed)
Chronic Care Management Pharmacy Note  11/15/2020 Name:  Sonya Small MRN:  [id number] DOB:  16-Mar-1951   Plan Recommendations:   Will review upcoming cholesterol panel to consider increase in Crestor due to CAD/stroke history goal of LDL <55.   Patient reporting excessive daytime sleepiness. She does not use CPAP machine. Discussed considering alternative attachment for CPAP machine to improve sleep quality. Patient reports she will discuss with pulmonologist at upcoming visit.   Subjective: Sonya Small is an 70 y.o. year old female who is a primary patient of [name], [name], MD.  The CCM team was consulted for assistance with disease management and care coordination needs.    Engaged with patient by telephone for follow up visit in response to provider referral for pharmacy case management and/or care coordination services.   Consent to Services:  The patient was given information about Chronic Care Management services, agreed to services, and gave verbal consent prior to initiation of services.  Please see initial visit note for detailed documentation.   Patient Care Team: [name], MD as PCP - General (Family Medicine) [name], [name], MD as PCP - Cardiology (Cardiology) Burnice Logan, [facility] as Pharmacist (Pharmacist)  Recent office visits: 10/27/2020 - lamotrigine 100 mg daily instead of abilify.   Recent consult visits: 10/21/2020 - Cardio - recommend smoking cessation. Asymptomatic CAD.  10/19/2020 - Cardio - upper back/shoulder pain for 2 weeks. Relieve nitroglycerin advised to go to ED.   Hospital visits: 10/19/2020 - ED - ruled out cardiac cause.   Objective:  Lab Results  Component Value Date   CREATININE 0.70 10/19/2020   BUN 16 10/19/2020   GFRNONAA >60 10/19/2020   GFRAA 97 10/03/2020   NA 143 10/19/2020   K 3.6 10/19/2020   CALCIUM 9.0 10/19/2020   CO2 27 10/19/2020    Lab Results  Component Value Date/Time   HGBA1C 6.6 (H) 08/17/2020 10:31 AM    HGBA1C 7.4 (H) 05/09/2020 11:16 AM   MICROALBUR 30 12/04/2019 10:43 AM    Last diabetic Eye exam: No results found for: HMDIABEYEEXA  Last diabetic Foot exam: No results found for: HMDIABFOOTEX   Lab Results  Component Value Date   CHOL 160 08/17/2020   HDL 41 08/17/2020   LDLCALC 93 08/17/2020   TRIG 150 (H) 08/17/2020   CHOLHDL 3.9 08/17/2020    Hepatic Function Latest Ref Rng & Units 10/19/2020 10/03/2020 08/17/2020  Total Protein 6.5 - 8.1 g/dL 6.3(L) 6.6 6.5  Albumin 3.5 - 5.0 g/dL 3.7 4.1 4.4  AST 15 - 41 U/L 25 18 16   ALT 0 - 44 U/L 19 17 16   Alk Phosphatase 38 - 126 U/L 50 85 59  Total Bilirubin 0.3 - 1.2 mg/dL 0.5 <0.2 0.2  Bilirubin, Direct 0.0 - 0.2 mg/dL <0.1 - -    Lab Results  Component Value Date/Time   TSH 0.619 12/04/2019 10:20 AM   TSH 1.130 07/13/2014 01:08 AM    CBC Latest Ref Rng & Units 10/19/2020 10/03/2020 08/17/2020  WBC 4.0 - 10.5 K/uL 7.2 8.2 10.0  Hemoglobin 12.0 - 15.0 g/dL 13.9 14.3 14.8  Hematocrit 36.0 - 46.0 % 44.1 44.5 43.9  Platelets 150 - 400 K/uL 239 257 281    No results found for: VD25OH  Clinical ASCVD: Yes  The ASCVD Risk score Mikey Bussing DC Jr., et al., 2013) failed to calculate for the following reasons:   The patient has a prior MI or stroke diagnosis    Depression screen PHQ  2/9 08/17/2020 08/10/2020 05/09/2020  Decreased Interest 3 3 3   Down, Depressed, Hopeless 3 3 3   PHQ - 2 Score 6 6 6   Altered sleeping 3 3 2   Tired, decreased energy 3 3 3   Change in appetite 2 1 2   Feeling bad or failure about yourself  3 2 3   Trouble concentrating 3 3 3   Moving slowly or fidgety/restless 3 3 3   Suicidal thoughts 1 0 0  PHQ-9 Score 24 21 22   Difficult doing work/chores - - Extremely dIfficult       Social History   Tobacco Use  Smoking Status Current Every Day Smoker  . Packs/day: 0.50  . Types: Cigarettes  . Last attempt to quit: 11/20/2012  . Years since quitting: 7.9  Smokeless Tobacco Never Used   BP Readings from Last 3  Encounters:  10/27/20 124/84  10/21/20 110/71  10/20/20 (!) 133/51   Pulse Readings from Last 3 Encounters:  10/27/20 78  10/21/20 83  10/20/20 77   Wt Readings from Last 3 Encounters:  10/27/20 163 lb (73.9 kg)  10/21/20 167 lb (75.8 kg)  10/03/20 167 lb (75.8 kg)    Assessment/Interventions: Review of patient past medical history, allergies, medications, health status, including review of consultants reports, laboratory and other test data, was performed as part of comprehensive evaluation and provision of chronic care management services.   SDOH:  (Social Determinants of Health) assessments and interventions performed: Yes   CCM Care Plan  Allergies  Allergen Reactions  . Abilify [Aripiprazole]     confusion    Medications Reviewed Today    Reviewed by Burnice Logan, [facility] (Pharmacist) on 11/15/20 at 1136  Med List Status: <None>  Medication Order Taking? Sig Documenting Provider Last Dose Status Informant  ALPRAZolam (XANAX) 0.5 MG tablet [id number] Yes Take 1 tablet (0.5 mg total) by mouth 3 (three) times daily. [name], MD Taking Active   Aspirin-Acetaminophen (GOODYS BODY PAIN PO) [id number] Yes Take 1 packet by mouth daily. [provider] Taking Active   clopidogrel (PLAVIX) 75 MG tablet [id number] Yes TAKE ONE TABLET BY MOUTH EVERY MORNING Marge Duncans, PA-C Taking Active   diclofenac Sodium (VOLTAREN) 1 % GEL [id number] No   Patient not taking: Reported on 11/15/2020   [provider] Not Taking Active   Dulaglutide (TRULICITY) 3 RF/1.6BW SOPN [id number] Yes Inject 3 mg as directed once a week. [name], MD Taking Active   fenofibrate 160 MG tablet [id number] Yes TAKE ONE TABLET BY MOUTH EVERY MORNING Marge Duncans, PA-C Taking Active   fluticasone (FLONASE) 50 MCG/ACT nasal spray [id number] Yes PLACE ONE SPRAY INTO BOTH NOSTRILS DAILY [name], [name], MD Taking Active   Fluticasone-Salmeterol,sensor, [facility]) 113-14 MCG/ACT AEPB [id number] Yes  Inhale 1 puff into the lungs in the morning and at bedtime. [provider] Taking Active   glucose blood (ONETOUCH VERIO) test strip [id number] Yes USE DAILY TO CHECK YOUR BLOOD SUGAR (E11.69) [name], [name], MD Taking Active   lamoTRIgine (LAMICTAL) 100 MG tablet [id number] Yes Take 1 tablet (100 mg total) by mouth at bedtime. [name], MD Taking Active   lansoprazole (PREVACID) 30 MG capsule [id number] Yes TAKE ONE CAPSULE BY MOUTH EVERY MORNING [name], [name], MD Taking Active   lisinopril-hydrochlorothiazide (ZESTORETIC) 10-12.5 MG tablet [id number] Yes TAKE ONE TABLET BY MOUTH EVERY MORNING Marge Duncans, PA-C Taking Active   nitroGLYCERIN (NITROSTAT) 0.4 MG SL tablet [id number] Yes Place 1 tablet (0.4 mg total) under the tongue every 5 (five) minutes as  needed. [name], MD Taking Active   [facility] VERIO test strip [id number] Yes USE DAILY TO CHECK YOUR BLOOD SUGAR (E11.69) Test 1 x daily [name], [name], MD Taking Active   oxyCODONE-acetaminophen (PERCOCET) 10-325 MG tablet [id number] Yes Take 1 tablet by mouth every 6 (six) hours as needed. [provider] Taking Active   rosuvastatin (CRESTOR) 20 MG tablet [id number] Yes TAKE ONE TABLET BY MOUTH EVERYDAY AT BEDTIME Marge Duncans, PA-C Taking Active   TRUEplus Lancets 30G [place] [id number] Yes 1 each by Does not apply route 2 (two) times daily. [name], [name], MD Taking Active   valACYclovir (VALTREX) 1000 MG tablet [id number] Yes TAKE TWO TABLETS BY MOUTH twice A DAY FOR ONE DAY as needed [name], Elnita Maxwell, MD Taking Active   VASCEPA 1 g capsule [id number] Yes TAKE TWO CAPSULES BY MOUTH EVERY MORNING and TAKE TWO TABLETS BY MOUTH EVERYDAY AT BEDTIME Marge Duncans, PA-C Taking Active   venlafaxine XR (EFFEXOR-XR) 75 MG 24 hr capsule [id number] Yes Take 1 capsule (75 mg total) by mouth every morning. [name], MD Taking Active           Patient Active Problem List   Diagnosis Date Noted  . Severe recurrent major depression without psychotic  features ([place]) 05/09/2020  . Atypical pigmented skin lesion 05/09/2020  . Dizziness 02/07/2020  . Dermatitis 01/12/2020  . Mixed stress and urge urinary incontinence 12/25/2019  . Localized edema 12/18/2019  . Dry mouth 12/18/2019  . Diabetic polyneuropathy associated with type 2 diabetes mellitus ([place]) 12/16/2019  . Weakness 12/07/2019  . Ingrown toenail of both feet 12/07/2019  . Moderate recurrent major depression ([place]) 12/03/2019  . Cigarette nicotine dependence with nicotine-induced disorder 12/03/2019  . Type 2 diabetes mellitus with hypertriglyceridemia ([place]) 12/03/2019  . Hypertensive heart disease   . Oral thrush 10/28/2015  . Pituitary adenoma ([place]) 02/22/2015  . Cerebral infarction due to thrombosis of left middle cerebral artery ([place]) 08/23/2014  . HLD (hyperlipidemia) 08/23/2014  . Essential hypertension 08/23/2014  . PVD (peripheral vascular disease) ([place]) 08/23/2014  . Former smoker 08/23/2014  . COPD (chronic obstructive pulmonary disease) ([place]) 07/13/2014  . OSA (obstructive sleep apnea) 07/13/2014  . Chronic diastolic heart failure ([place]) 07/13/2014  . Stroke ([place]) 07/12/2014  . HTN (hypertension) 07/12/2014  . Peripheral vascular disease, unspecified ([place]) 02/17/2013  . Aftercare following surgery of the circulatory system, [place] 02/17/2013  . Chest pain 10/16/2012  . Coronary artery disease   . PAD (peripheral artery disease) ([place])   . Hyperlipidemia   . Tobacco abuse   . History of myocardial infarction 01/22/2010    Immunization History  Administered Date(s) Administered  . Fluad Quad(high Dose 65+) 08/29/2020  . Influenza,inj,Quad PF,6+ Mos 07/13/2014  . Influenza-Unspecified 05/29/2019  . Moderna Sars-Covid-2 Vaccination 07/11/2020, 08/01/2020  . Pneumococcal Conjugate-13 01/20/2015  . Pneumococcal Polysaccharide-23 06/26/2011    Conditions to be addressed/monitored:  Hypertension, Hyperlipidemia, Diabetes, Heart Failure, Coronary Artery Disease and  COPD  Care Plan : ccm pharmacy care plan  Updates made by Burnice Logan, RPH since 11/15/2020 12:00 AM    Problem: htn,hld,dm   Priority: High  Onset Date: 11/15/2020    Long-Range Goal: disease state management   Start Date: 11/15/2020  Expected End Date: 11/15/2021  This Visit's Progress: On track  Priority: High  Note:    Current Barriers:  . Unable to achieve control of pain and sleepiness.    . Needs testing supplies through part B benefit.   Pharmacist Clinical Goal(s):  .  Over the next 90 days, patient will maintain control of blood sugar  as evidenced by a1c  through collaboration with PharmD and provider.   Interventions: . 1:1 collaboration with [name], MD regarding development and update of comprehensive plan of care as evidenced by provider attestation and co-signature . Inter-disciplinary care team collaboration (see longitudinal plan of care) . Comprehensive medication review performed; medication list updated in electronic medical record  Hypertension (BP goal <130/80) -controlled -Current treatment: . Lisinopril-hydrochlorothiazide 10-12.5 mg every morning -Medications previously tried: amlodipine  -Current home readings: 133/87 mmHg -Current dietary habits: enjoying fruit. Cooks at home.  -Current exercise habits: minimal due to back pain  -Denies hypotensive/hypertensive symptoms -Educated on BP goals and benefits of medications for prevention of heart attack, stroke and kidney damage; Daily salt intake goal < 2300 mg; Importance of home blood pressure monitoring; -Counseled to monitor BP at home daily, document, and provide log at future appointments -Counseled on diet and exercise extensively Recommended to continue current medication  Hyperlipidemia/CAD: (LDL goal < 55) -uncontrolled -Current treatment: . rosuvastatin 20 mg daily at bedtime  . Fenofibrate 160 mg daily am  . Plavix 75 mg daily  . Vascepa 1 gram 2 capsules bid  -Medications  previously tried: none reported -Current dietary patterns: cooks at home. Eats meatloaf, pinto beane, stewed potatoes -Current exercise habits: limited due to back pain -Educated on Cholesterol goals;  Benefits of statin for ASCVD risk reduction; Importance of limiting foods high in cholesterol; -Counseled on diet and exercise extensively Recommended to continue current medication. Will review upcoming cholesterol panel to consider change in regimen due to CAD/stroke history goal of LDL <55.   Diabetes (A1c goal <7%) -controlled -Current medications: . Onetouch Verio . Trulicity 3 mg weekly  -Medications previously tried: none reported today  -Current home glucose readings . fasting glucose: 93-165 -Denies hypoglycemic/hyperglycemic symptoms -Current meal patterns:  . breakfast: fruit  . Lunch and supper:  variety of meat and vegtables . drinks: nature's twist, water, diet drinks -Current exercise: limited due to back pain -Educated onA1c and blood sugar goals; Complications of diabetes including kidney damage, retinal damage, and cardiovascular disease; Benefits of routine self-monitoring of blood sugar; -Counseled to check feet daily and get yearly eye exams -Counseled on diet and exercise extensively Recommended to continue current medication  Osteoporosis / Osteopenia (Goal prevent fracture or broken bones) -controlled -Last DEXA Scan:  T-Score femoral neck: -2.3  T-Score lumbar spine: -2.1  10-year probability of major osteoporotic fracture: 12.4%  10-year probability of hip fracture: 2.4% -Patient is not a candidate for pharmacologic treatment -Current treatment  . Diet and lifestyle -Medications previously tried: none reported  -Recommend [phone] units of vitamin D daily. Recommend 1200 mg of calcium daily from dietary and supplemental sources. Recommend weight-bearing and muscle strengthening exercises for building and maintaining bone density. -Counseled on diet  and exercise extensively   Patient Goals/Self-Care Activities . Over the next 90 days, patient will:  - take medications as prescribed check glucose daily, document, and provide at future appointments check blood pressure daily, document, and provide at future appointments  Follow Up Plan: Telephone follow up appointment with care management team member scheduled for: 04/2021      Medication Assistance: None required.  Patient affirms current coverage meets needs.  Patient's preferred pharmacy is:  [id number] [place] [id number]: [phone]: 3[place]  [facility] [address] [place] [address] Phone: [phone] Fax: [phone]  [place], [place] [address] Phone: [phone] Fax: [phone]  Uses pill box? Yes Pt endorses good compliance  We discussed: Benefits of medication synchronization, packaging and delivery as well as enhanced pharmacist oversight with [facility]. Patient decided to: Utilize [facility] pharmacy for medication synchronization, packaging and delivery  Care Plan and Follow Up Patient Decision:  Patient agrees to Care Plan and Follow-up.  Plan: Telephone follow up appointment with care management team member scheduled for:  04/2021

## 2020-11-15 NOTE — Patient Instructions (Addendum)
Visit Information  Goals Addressed   None    There are no care plans to display for this patient.    The patient verbalized understanding of instructions, educational materials, and care plan provided today and declined offer to receive copy of patient instructions, educational materials, and care plan.  Telephone follow up appointment with pharmacy team member scheduled for:  Sonya Small, [facility]  Preventing Diabetes Mellitus Complications You can help to prevent or slow down problems that are caused by diabetes (diabetes mellitus). Following your diabetes plan and taking care of yourself can reduce your risk of serious or life-threatening complications. What actions can I take to prevent diabetes complications? Diabetes management  Follow instructions from your health care providers about managing your diabetes. Your diabetes may be managed by a team of health care providers who can teach you how to care for yourself and can answer questions that you have.  Educate yourself about your condition so you can make healthy choices about eating and physical activity.  Know your target range for your blood sugar (glucose), and check your blood glucose level as often as told. Your health care provider will help you decide how often to check your blood glucose level depending on your treatment goals and how well you are meeting them.  Ask your health care provider if you should take low-dose aspirin daily and what dose is recommended for you. Taking low-dose aspirin daily is recommended to help prevent cardiovascular disease.   Controlling your blood pressure and cholesterol Your personal target blood pressure is determined based on:  Your age.  Your medicines.  How long you have had diabetes.  Any other medical conditions you have. To control your blood pressure:  Follow instructions from your health care provider about meal planning, exercise, and medicines.  Make sure your health  care provider checks your blood pressure at every medical visit.  Monitor your blood pressure at home as told by your health care provider. To control your cholesterol:  Follow instructions from your health care provider about meal planning, exercise, and medicines.  Have your cholesterol checked at least once a year.  You may be prescribed medicine to lower cholesterol (statin). If you are not taking a statin, ask your health care provider if you should be. Controlling your cholesterol may:  Help prevent heart disease and stroke. These are the most common health problems for people with diabetes.  Improve your blood flow.   Medical appointments and vaccines Schedule and keep yearly physical exams and eye exams. Your health care provider will tell you how often you need medical visits depending on your diabetes management plan. Keep all follow-up visits as told. This is important so possible problems can be identified early and complications can be avoided or treated.  Every visit with your health care provider should include measuring your: ? Weight. ? Blood pressure. ? Blood glucose control.  Your A1C (hemoglobin A1C) level should be checked: ? At least 2 times a year, if you are meeting your treatment goals. ? 4 times a year, if you are not meeting treatment goals or if your treatment goals have changed.  Your blood lipids (lipid profile) should be checked yearly. You should also be checked yearly for protein in your urine (urine microalbumin).  If you have type 1 diabetes, get an eye exam 3-5 years after you are diagnosed, and then once a year after your first exam.  If you have type 2 diabetes, get an eye exam  as soon as you are diagnosed, and then once a year after your first exam. It is also important to keep your vaccines current. It is recommended that you receive:  A flu (influenza) vaccine every year.  A pneumonia (pneumococcal) vaccine and a hepatitis B vaccine. If you  are age 22 or older, you may get the pneumonia vaccine as a series of two separate shots. Ask your health care provider which other vaccines may be recommended. Lifestyle  Do not use any products that contain nicotine or tobacco, such as cigarettes, e-cigarettes, and chewing tobacco. If you need help quitting, ask your health care provider. By avoiding nicotine and tobacco: ? You will lower your risk for heart attack, stroke, nerve disease, and kidney disease. ? Your cholesterol and blood pressure may improve. ? Your blood circulation will improve.  If you drink alcohol: ? Limit how much you use to:  0-1 drink a day for women who are not pregnant.  0-2 drinks a day for men. ? Be aware of how much alcohol is in your drink. In the [place] one drink equals one 12 oz bottle of beer (355 mL), one 5 oz glass of wine (148 mL), or one 11?2 oz glass of hard liquor (44 mL). Taking care of your feet Diabetes may cause you to have poor blood circulation to your legs and feet. Because of this, taking care of your feet is very important. Diabetes can cause:  The skin on the feet to get thinner, break more easily, and heal more slowly.  Nerve damage in your legs and feet, which results in decreased feeling. You may not notice minor injuries that could lead to serious problems. To avoid foot problems:  Check your skin and feet every day for cuts, bruises, redness, blisters, or sores.  Schedule a foot exam with your health care provider once every year. This exam includes: ? Inspecting the structure and skin of your feet. ? Checking the pulses and sensation in your feet.  Make sure that your health care provider performs a visual foot exam at every medical visit.   Taking care of your teeth People with poorly controlled diabetes are more likely to have gum (periodontal) disease. Diabetes can make periodontal diseases harder to control. If not treated, periodontal diseases can lead to tooth loss. To  prevent this:  Brush your teeth twice a day.  Floss at least once a day.  Visit your dentist 2 times a year. Managing stress Living with diabetes can be stressful. When you are experiencing stress, your blood glucose may be affected in two ways:  Stress hormones may cause your blood glucose to rise.  You may be distracted from taking good care of yourself. Be aware of your stress level and make changes to help you manage challenging situations. To lower your stress levels:  Consider joining a support group.  Do planned relaxation or meditation.  Do a hobby that you enjoy.  Maintain healthy relationships.  Exercise regularly.  Work with your health care provider or a mental health professional. Where to find more information  American Diabetes Association: www.diabetes.org  Association of Diabetes Care and Education Specialists: www.diabeteseducator.org Summary  You can take action to prevent or slow down problems that are caused by diabetes (diabetes mellitus). Following your diabetes plan and taking care of yourself can reduce your risk of serious or life-threatening complications.  Follow instructions from your health care providers about managing your diabetes. Your diabetes may be managed by a team  of health care providers who can teach you how to care for yourself and can answer questions that you have.  Know your target range for your blood sugar (glucose), and check your blood glucose levels as often as told. Your health care provider will help you decide how often you should check your blood glucose level depending on your treatment goals and how well you are meeting them.  Your health care provider will tell you how often you need medical visits depending on your diabetes management plan. Keep all follow-up visits as directed. This is important so possible problems can be identified early and complications can be avoided or treated. This information is not intended to  replace advice given to you by your health care provider. Make sure you discuss any questions you have with your health care provider. Document Revised: 10/30/2019 Document Reviewed: 10/30/2019 Elsevier Patient Education  2021 [employer].

## 2020-11-18 ENCOUNTER — Telehealth: Payer: Self-pay

## 2020-11-18 NOTE — Progress Notes (Signed)
Chronic Care Management Pharmacy Assistant   Name: Sonya Small  MRN: [id number] DOB: 1951/08/21  Reason for Encounter:  Adherence   PCP : [name], MD  Allergies:   Allergies  Allergen Reactions  . Abilify [Aripiprazole]     confusion    Medications: Outpatient Encounter Medications as of 11/18/2020  Medication Sig  . ALPRAZolam (XANAX) 0.5 MG tablet Take 1 tablet (0.5 mg total) by mouth 3 (three) times daily.  . Aspirin-Acetaminophen (GOODYS BODY PAIN PO) Take 1 packet by mouth daily.  . clopidogrel (PLAVIX) 75 MG tablet TAKE ONE TABLET BY MOUTH EVERY MORNING  . diclofenac Sodium (VOLTAREN) 1 % GEL  (Patient not taking: Reported on 11/15/2020)  . Dulaglutide (TRULICITY) 3 QX/4.5WT SOPN Inject 3 mg as directed once a week.  . fenofibrate 160 MG tablet TAKE ONE TABLET BY MOUTH EVERY MORNING  . fluticasone (FLONASE) 50 MCG/ACT nasal spray PLACE ONE SPRAY INTO BOTH NOSTRILS DAILY  . Fluticasone-Salmeterol,sensor, (AIRDUO DIGIHALER) 113-14 MCG/ACT AEPB Inhale 1 puff into the lungs in the morning and at bedtime.  [name] glucose blood (ONETOUCH VERIO) test strip USE DAILY TO CHECK YOUR BLOOD SUGAR (E11.69)  . lamoTRIgine (LAMICTAL) 100 MG tablet Take 1 tablet (100 mg total) by mouth at bedtime.  . lansoprazole (PREVACID) 30 MG capsule TAKE ONE CAPSULE BY MOUTH EVERY MORNING  . lisinopril-hydrochlorothiazide (ZESTORETIC) 10-12.5 MG tablet TAKE ONE TABLET BY MOUTH EVERY MORNING  . nitroGLYCERIN (NITROSTAT) 0.4 MG SL tablet Place 1 tablet (0.4 mg total) under the tongue every 5 (five) minutes as needed.  [name] VERIO test strip USE DAILY TO CHECK YOUR BLOOD SUGAR (E11.69) Test 1 x daily  . oxyCODONE-acetaminophen (PERCOCET) 10-325 MG tablet Take 1 tablet by mouth every 6 (six) hours as needed.  . rosuvastatin (CRESTOR) 20 MG tablet TAKE ONE TABLET BY MOUTH EVERYDAY AT BEDTIME  . TRUEplus Lancets 30G MISC 1 each by Does not apply route 2 (two) times daily.  . valACYclovir (VALTREX)  1000 MG tablet TAKE TWO TABLETS BY MOUTH twice A DAY FOR ONE DAY as needed  . VASCEPA 1 g capsule TAKE TWO CAPSULES BY MOUTH EVERY MORNING and TAKE TWO TABLETS BY MOUTH EVERYDAY AT BEDTIME  . venlafaxine XR (EFFEXOR-XR) 75 MG 24 hr capsule Take 1 capsule (75 mg total) by mouth every morning.   No facility-administered encounter medications on file as of 11/18/2020.    Current Diagnosis: Patient Active Problem List   Diagnosis Date Noted  . Severe recurrent major depression without psychotic features ([place]) 05/09/2020  . Atypical pigmented skin lesion 05/09/2020  . Dizziness 02/07/2020  . Dermatitis 01/12/2020  . Mixed stress and urge urinary incontinence 12/25/2019  . Localized edema 12/18/2019  . Dry mouth 12/18/2019  . Diabetic polyneuropathy associated with type 2 diabetes mellitus ([place]) 12/16/2019  . Weakness 12/07/2019  . Ingrown toenail of both feet 12/07/2019  . Moderate recurrent major depression ([place]) 12/03/2019  . Cigarette nicotine dependence with nicotine-induced disorder 12/03/2019  . Type 2 diabetes mellitus with hypertriglyceridemia ([place]) 12/03/2019  . Hypertensive heart disease   . Oral thrush 10/28/2015  . Pituitary adenoma ([place]) 02/22/2015  . Cerebral infarction due to thrombosis of left middle cerebral artery ([place]) 08/23/2014  . HLD (hyperlipidemia) 08/23/2014  . Essential hypertension 08/23/2014  . PVD (peripheral vascular disease) ([place]) 08/23/2014  . Former smoker 08/23/2014  . COPD (chronic obstructive pulmonary disease) ([place]) 07/13/2014  . OSA (obstructive sleep apnea) 07/13/2014  . Chronic diastolic heart failure ([place]) 07/13/2014  .  Stroke ([place]) 07/12/2014  . HTN (hypertension) 07/12/2014  . Peripheral vascular disease, unspecified ([place]) 02/17/2013  . Aftercare following surgery of the circulatory system, [place] 02/17/2013  . Chest pain 10/16/2012  . Coronary artery disease   . PAD (peripheral artery disease) ([place])   . Hyperlipidemia   . Tobacco abuse   .  History of myocardial infarction 01/22/2010   Chart review noted the patient saw [name], CPP, on 11/15/20.  The following changes were noted:  10/27/2020 - lamotrigine 100 mg daily instead of abilify  Will review upcoming cholesterol panel to consider increase in Crestor due to CAD/stroke history goal of LDL <55.   Patient reporting excessive daytime sleepiness. She does not use CPAP machine. Discussed considering alternative attachment for CPAP machine to improve sleep quality. Patient reports she will discuss with pulmonologist at upcoming visit  Patient has her next appointment on 11/25/20 with  Dr. [name]  Adherence gap-not available   Follow-Up:  Pharmacist Review  [name], [place], [place] Pharmacist Assistant [phone]

## 2020-11-20 ENCOUNTER — Other Ambulatory Visit: Payer: Self-pay | Admitting: Family Medicine

## 2020-11-22 ENCOUNTER — Telehealth: Payer: Self-pay

## 2020-11-22 ENCOUNTER — Other Ambulatory Visit: Payer: Self-pay

## 2020-11-22 ENCOUNTER — Ambulatory Visit: Payer: Medicare Other | Admitting: Family Medicine

## 2020-11-22 DIAGNOSIS — R5383 Other fatigue: Secondary | ICD-10-CM

## 2020-11-22 MED ORDER — ONETOUCH VERIO VI STRP: ORAL_STRIP | 3 refills | Status: DC

## 2020-11-22 MED ORDER — VENLAFAXINE HCL ER 75 MG PO CP24: 75.0000 mg | ORAL_CAPSULE | Freq: Every morning | ORAL | 1 refills | Status: DC

## 2020-11-22 MED ORDER — LANSOPRAZOLE 30 MG PO CPDR: 30.0000 mg | DELAYED_RELEASE_CAPSULE | Freq: Every morning | ORAL | 1 refills | Status: DC

## 2020-11-22 NOTE — Progress Notes (Signed)
Chronic Care Management Pharmacy Assistant   Name: Sonya Small  MRN: [id number] DOB: 24-Feb-1951  Reason for Encounter: Medication Review for refill on test strips   PCP : [name], MD  Allergies:   Allergies  Allergen Reactions  . Abilify [Aripiprazole]     confusion    Medications: Outpatient Encounter Medications as of 11/22/2020  Medication Sig  . ALPRAZolam (XANAX) 0.5 MG tablet Take 1 tablet (0.5 mg total) by mouth 3 (three) times daily.  . Aspirin-Acetaminophen (GOODYS BODY PAIN PO) Take 1 packet by mouth daily.  . clopidogrel (PLAVIX) 75 MG tablet TAKE ONE TABLET BY MOUTH EVERY MORNING  . diclofenac Sodium (VOLTAREN) 1 % GEL  (Patient not taking: Reported on 11/15/2020)  . Dulaglutide (TRULICITY) 3 WL/7.9GX SOPN Inject 3 mg as directed once a week.  . fenofibrate 160 MG tablet TAKE ONE TABLET BY MOUTH EVERY MORNING  . fluticasone (FLONASE) 50 MCG/ACT nasal spray PLACE ONE SPRAY INTO BOTH NOSTRILS DAILY  . Fluticasone-Salmeterol,sensor, (AIRDUO DIGIHALER) 113-14 MCG/ACT AEPB Inhale 1 puff into the lungs in the morning and at bedtime.  [name] glucose blood (ONETOUCH VERIO) test strip USE DAILY TO CHECK YOUR BLOOD SUGAR (E11.69)  . lamoTRIgine (LAMICTAL) 100 MG tablet TAKE ONE TABLET BY MOUTH EVERYDAY AT BEDTIME  . lansoprazole (PREVACID) 30 MG capsule TAKE ONE CAPSULE BY MOUTH EVERY MORNING  . lisinopril-hydrochlorothiazide (ZESTORETIC) 10-12.5 MG tablet TAKE ONE TABLET BY MOUTH EVERY MORNING  . nitroGLYCERIN (NITROSTAT) 0.4 MG SL tablet Place 1 tablet (0.4 mg total) under the tongue every 5 (five) minutes as needed.  [name] VERIO test strip USE DAILY TO CHECK YOUR BLOOD SUGAR (E11.69) Test 1 x daily  . oxyCODONE-acetaminophen (PERCOCET) 10-325 MG tablet Take 1 tablet by mouth every 6 (six) hours as needed.  . rosuvastatin (CRESTOR) 20 MG tablet TAKE ONE TABLET BY MOUTH EVERYDAY AT BEDTIME  . TRUEplus Lancets 30G MISC 1 each by Does not apply route 2 (two) times daily.   . valACYclovir (VALTREX) 1000 MG tablet TAKE TWO TABLETS BY MOUTH twice A DAY FOR ONE DAY as needed  . VASCEPA 1 g capsule TAKE TWO CAPSULES BY MOUTH EVERY MORNING and TAKE TWO TABLETS BY MOUTH EVERYDAY AT BEDTIME  . venlafaxine XR (EFFEXOR-XR) 75 MG 24 hr capsule Take 1 capsule (75 mg total) by mouth every morning.   No facility-administered encounter medications on file as of 11/22/2020.    Current Diagnosis: Patient Active Problem List   Diagnosis Date Noted  . Severe recurrent major depression without psychotic features ([place]) 05/09/2020  . Atypical pigmented skin lesion 05/09/2020  . Dizziness 02/07/2020  . Dermatitis 01/12/2020  . Mixed stress and urge urinary incontinence 12/25/2019  . Localized edema 12/18/2019  . Dry mouth 12/18/2019  . Diabetic polyneuropathy associated with type 2 diabetes mellitus ([place]) 12/16/2019  . Weakness 12/07/2019  . Ingrown toenail of both feet 12/07/2019  . Moderate recurrent major depression ([place]) 12/03/2019  . Cigarette nicotine dependence with nicotine-induced disorder 12/03/2019  . Type 2 diabetes mellitus with hypertriglyceridemia ([place]) 12/03/2019  . Hypertensive heart disease   . Oral thrush 10/28/2015  . Pituitary adenoma ([place]) 02/22/2015  . Cerebral infarction due to thrombosis of left middle cerebral artery ([place]) 08/23/2014  . HLD (hyperlipidemia) 08/23/2014  . Essential hypertension 08/23/2014  . PVD (peripheral vascular disease) ([place]) 08/23/2014  . Former smoker 08/23/2014  . COPD (chronic obstructive pulmonary disease) ([place]) 07/13/2014  . OSA (obstructive sleep apnea) 07/13/2014  . Chronic diastolic heart failure (  [place]) 07/13/2014  . Stroke ([place]) 07/12/2014  . HTN (hypertension) 07/12/2014  . Peripheral vascular disease, unspecified ([place]) 02/17/2013  . Aftercare following surgery of the circulatory system, [place] 02/17/2013  . Chest pain 10/16/2012  . Coronary artery disease   . PAD (peripheral artery disease) ([place])   .  Hyperlipidemia   . Tobacco abuse   . History of myocardial infarction 01/22/2010    Patient left a message stating she has requested a refill on her test strips to be sent to [facility] on [address].  She called today, 11/22/20, and it had not been received.   I notified [name], CPP, she stated it had been requested from Dr. [name] nurse on Thursday, she was going to put in another request now to be sent over.  The new request was sent and verified by [name], CPP  I called the patient back and let her know the request had been made again, and to check back with her pharmacy in a little while.    Follow-Up:  Pharmacist Review  [name], [place], [place] Pharmacist Assistant [phone]

## 2020-11-22 NOTE — Progress Notes (Signed)
Chronic Care Management Pharmacy Assistant   Name: Sonya Small  MRN: [id number] DOB: 1950-11-06  Reason for Encounter: Medication Review for [facility] delivery  Patient Questions:  1.  Have you seen any other providers since your last visit?  11/15/20- Saw [name], CPP     2.  Any changes in your medicines or health?  10/27/20-lamotrigine 100 mg daily instead of abilify.   PCP : [name], MD  Allergies:   Allergies  Allergen Reactions  . Abilify [Aripiprazole]     confusion    Medications: Outpatient Encounter Medications as of 11/22/2020  Medication Sig  . ALPRAZolam (XANAX) 0.5 MG tablet Take 1 tablet (0.5 mg total) by mouth 3 (three) times daily.  . Aspirin-Acetaminophen (GOODYS BODY PAIN PO) Take 1 packet by mouth daily.  . clopidogrel (PLAVIX) 75 MG tablet TAKE ONE TABLET BY MOUTH EVERY MORNING  . diclofenac Sodium (VOLTAREN) 1 % GEL  (Patient not taking: Reported on 11/15/2020)  . Dulaglutide (TRULICITY) 3 UU/7.2ZD SOPN Inject 3 mg as directed once a week.  . fenofibrate 160 MG tablet TAKE ONE TABLET BY MOUTH EVERY MORNING  . fluticasone (FLONASE) 50 MCG/ACT nasal spray PLACE ONE SPRAY INTO BOTH NOSTRILS DAILY  . Fluticasone-Salmeterol,sensor, (AIRDUO DIGIHALER) 113-14 MCG/ACT AEPB Inhale 1 puff into the lungs in the morning and at bedtime.  [name] glucose blood (ONETOUCH VERIO) test strip USE DAILY TO CHECK YOUR BLOOD SUGAR (E11.69)  . lamoTRIgine (LAMICTAL) 100 MG tablet TAKE ONE TABLET BY MOUTH EVERYDAY AT BEDTIME  . lansoprazole (PREVACID) 30 MG capsule TAKE ONE CAPSULE BY MOUTH EVERY MORNING  . lisinopril-hydrochlorothiazide (ZESTORETIC) 10-12.5 MG tablet TAKE ONE TABLET BY MOUTH EVERY MORNING  . nitroGLYCERIN (NITROSTAT) 0.4 MG SL tablet Place 1 tablet (0.4 mg total) under the tongue every 5 (five) minutes as needed.  [name] VERIO test strip USE DAILY TO CHECK YOUR BLOOD SUGAR (E11.69) Test 1 x daily  . oxyCODONE-acetaminophen (PERCOCET) 10-325 MG tablet Take 1  tablet by mouth every 6 (six) hours as needed.  . rosuvastatin (CRESTOR) 20 MG tablet TAKE ONE TABLET BY MOUTH EVERYDAY AT BEDTIME  . TRUEplus Lancets 30G MISC 1 each by Does not apply route 2 (two) times daily.  . valACYclovir (VALTREX) 1000 MG tablet TAKE TWO TABLETS BY MOUTH twice A DAY FOR ONE DAY as needed  . VASCEPA 1 g capsule TAKE TWO CAPSULES BY MOUTH EVERY MORNING and TAKE TWO TABLETS BY MOUTH EVERYDAY AT BEDTIME  . venlafaxine XR (EFFEXOR-XR) 75 MG 24 hr capsule Take 1 capsule (75 mg total) by mouth every morning.   No facility-administered encounter medications on file as of 11/22/2020.    Current Diagnosis: Patient Active Problem List   Diagnosis Date Noted  . Severe recurrent major depression without psychotic features ([place]) 05/09/2020  . Atypical pigmented skin lesion 05/09/2020  . Dizziness 02/07/2020  . Dermatitis 01/12/2020  . Mixed stress and urge urinary incontinence 12/25/2019  . Localized edema 12/18/2019  . Dry mouth 12/18/2019  . Diabetic polyneuropathy associated with type 2 diabetes mellitus ([place]) 12/16/2019  . Weakness 12/07/2019  . Ingrown toenail of both feet 12/07/2019  . Moderate recurrent major depression ([place]) 12/03/2019  . Cigarette nicotine dependence with nicotine-induced disorder 12/03/2019  . Type 2 diabetes mellitus with hypertriglyceridemia ([place]) 12/03/2019  . Hypertensive heart disease   . Oral thrush 10/28/2015  . Pituitary adenoma ([place]) 02/22/2015  . Cerebral infarction due to thrombosis of left middle cerebral artery ([place]) 08/23/2014  . HLD (hyperlipidemia)  08/23/2014  . Essential hypertension 08/23/2014  . PVD (peripheral vascular disease) ([place]) 08/23/2014  . Former smoker 08/23/2014  . COPD (chronic obstructive pulmonary disease) ([place]) 07/13/2014  . OSA (obstructive sleep apnea) 07/13/2014  . Chronic diastolic heart failure ([place]) 07/13/2014  . Stroke ([place]) 07/12/2014  . HTN (hypertension) 07/12/2014  . Peripheral vascular disease,  unspecified ([place]) 02/17/2013  . Aftercare following surgery of the circulatory system, [place] 02/17/2013  . Chest pain 10/16/2012  . Coronary artery disease   . PAD (peripheral artery disease) ([place])   . Hyperlipidemia   . Tobacco abuse   . History of myocardial infarction 01/22/2010    Reviewed chart for medication changes ahead of medication coordination call.  No OVs, Consults, or hospital visits since last care coordination call/Pharmacist visit. (If appropriate, list visit date, provider name)  No medication changes indicated OR if recent visit, treatment plan here.  BP Readings from Last 3 Encounters:  10/27/20 124/84  10/21/20 110/71  10/20/20 (!) 133/51    Lab Results  Component Value Date   HGBA1C 6.6 (H) 08/17/2020     Patient obtains medications through Adherence Packaging  30 Days   Last adherence delivery included: This is her first packs delivery   Patient is due for next adherence delivery on: 11/28/20 Called patient and reviewed medications and coordinated delivery.  This delivery to include: Clopidogrel 75mg - Breakfast Fenofibrate 160mg -breakfast Lamotrigine 100mg  --bedtime Lansoprazole 30mg - before breakfast Lisinopril HCTZ 10-12.5-breakfst Rosuvastatin 20mg -bedtime Trulicity pens Vascepa 1G- 2 breakfast  2 evening meal  Venlafaxine XR 75mg -breakfast   Patient declined the following medications (meds) due to (reason) Flonase- had enough  Patient needs refills for Lansoprazole 30mg - before breakfast Venlafaxine XR 75mg -breakfast   Confirmed delivery date of 11/28/20, advised patient that pharmacy will contact them the morning of delivery.  Follow-Up:  Comptroller and Pharmacist Review  [name], [place], [place] Pharmacist Assistant [phone]

## 2020-11-24 NOTE — Progress Notes (Signed)
Subjective:  Patient ID: Sonya Small, female    DOB: 21-Jul-1951  Age: 70 y.o. MRN: [id number]  Chief Complaint  Patient presents with  . Diabetes  . Hypertension    HPI Essential hypertension Lisinopril HCTZ 10/12.5 mg once daily 120-140/70-80s at home.    Diabetic polyneuropathy associated with type 2 diabetes mellitus (HCC) Taking trulicity 3 mg weekly.  Patient is checking sugars once a day.  Ranging 103-120s.  Denies excessive thirst, polyuria or polydipsia.  Simple chronic bronchitis (HCC) Pt has smoker's cough. ON airduo and has albuterol.  Coronary artery disease: Patient currently on Plavix, fenofibrate, Vascepa, Crestor.  Patient follows up with cardiology.  Cardiology sent the patient to the emergency department at the end of January due to some upper back pain. Upper back pain across shoulder blades left to right. Comes and goes. No triggers. Stretching helps some. Seen in ED on 10/20/2020 and ruled out cardiac or pulmonary cause. CXR normal. Cardiac proteins were normal. No source found for pain except musculoskeletal. Improved, just not fully gone.  She has been using some tizanidine 4 mg half pill daily as needed which does seem to help.  Mixed hyperlipidemia Patient tries to eat healthy.  Minimal exercise Fenofibrate, crestor and vascepa  Moderate recurrent depression with anxiety: Currently on venlafaxin ER 75 mg once daily in a.m.  On Xanax 0.5 mg 3 times a day.  Patient is also seen by the pain clinic for chronic back pain.  She is on Percocet 10/325 mg 1 tablet every 6 hours as needed.  She usually averages 3-4 Percocet per day.  Drug screen done for the pain clinic here was consistent with her medications.  Current Outpatient Medications on File Prior to Visit  Medication Sig Dispense Refill  . Aspirin-Acetaminophen (GOODYS BODY PAIN PO) Take 1 packet by mouth daily.    . clopidogrel (PLAVIX) 75 MG tablet TAKE ONE TABLET BY MOUTH EVERY MORNING 90 tablet 2  .  diclofenac Sodium (VOLTAREN) 1 % GEL  (Patient not taking: Reported on 11/15/2020)    . Dulaglutide (TRULICITY) 3 EG/3.1DV SOPN Inject 3 mg as directed once a week. 4 mL 2  . fenofibrate 160 MG tablet TAKE ONE TABLET BY MOUTH EVERY MORNING 90 tablet 2  . fluticasone (FLONASE) 50 MCG/ACT nasal spray PLACE ONE SPRAY INTO BOTH NOSTRILS DAILY 16 g 1  . Fluticasone-Salmeterol,sensor, (AIRDUO DIGIHALER) 113-14 MCG/ACT AEPB Inhale 1 puff into the lungs in the morning and at bedtime.    [name] glucose blood (ONETOUCH VERIO) test strip USE DAILY TO CHECK YOUR BLOOD SUGAR (E11.69) 100 each 3  . lamoTRIgine (LAMICTAL) 100 MG tablet TAKE ONE TABLET BY MOUTH EVERYDAY AT BEDTIME 30 tablet 1  . lansoprazole (PREVACID) 30 MG capsule Take 1 capsule (30 mg total) by mouth every morning. 90 capsule 1  . lisinopril-hydrochlorothiazide (ZESTORETIC) 10-12.5 MG tablet TAKE ONE TABLET BY MOUTH EVERY MORNING 90 tablet 2  . nitroGLYCERIN (NITROSTAT) 0.4 MG SL tablet Place 1 tablet (0.4 mg total) under the tongue every 5 (five) minutes as needed. 25 tablet 1  . ONETOUCH VERIO test strip USE DAILY TO CHECK YOUR BLOOD SUGAR (E11.69) Test 1 x daily 100 each 3  . oxyCODONE-acetaminophen (PERCOCET) 10-325 MG tablet Take 1 tablet by mouth every 6 (six) hours as needed.    . rosuvastatin (CRESTOR) 20 MG tablet TAKE ONE TABLET BY MOUTH EVERYDAY AT BEDTIME 90 tablet 2  . TRUEplus Lancets 30G MISC 1 each by Does not apply route 2 (  two) times daily. 100 each 2  . valACYclovir (VALTREX) 1000 MG tablet TAKE TWO TABLETS BY MOUTH twice A DAY FOR ONE DAY as needed 10 tablet 2  . VASCEPA 1 g capsule TAKE TWO CAPSULES BY MOUTH EVERY MORNING and TAKE TWO TABLETS BY MOUTH EVERYDAY AT BEDTIME 360 capsule 2  . venlafaxine XR (EFFEXOR-XR) 75 MG 24 hr capsule Take 1 capsule (75 mg total) by mouth every morning. 90 capsule 1   No current facility-administered medications on file prior to visit.   Past Medical History:  Diagnosis Date  . Anxiety   .  CAD (coronary artery disease)    a. NSTEMI 11/2008 s/p DES to LCx (3.0x12 Xience); b. NSTEMI 01/2010 secondary to thrombotic RCA lesion (non-obstructive)-->med rx (integrilin x 24 hrs + plavix); c. 09/2012 negative Myoview.  . Chronic diastolic CHF (congestive heart failure) ([place])    a. 06/2014 Echo: EF 55-60%, no rwma, Gr1 DD, mild AI.  [name] Depression   . Dizziness   . Drug induced constipation   . Generalized hyperhidrosis   . GERD (gastroesophageal reflux disease)   . Headache   . Hyperlipidemia   . Hypertensive heart disease   . Lumbar disc disease   . Metabolic encephalopathy   . Mixed hyperlipidemia   . Myocardial infarction ([place])   . Obstructive sleep apnea   . OP (osteoporosis)   . Osteoarthritis   . Osteoporosis   . Other malaise   . Overweight(278.02)   . PAD (peripheral artery disease) ( [place])    a. Emboli to R foot 2010 from partially occlusive lesion in R EIA, s/p stenting. - followed by Dr. Donnetta Hutching;  b. 10/2015 ABIs: R 1.03, L 0.97.  [name] Restless leg   . Sleep apnea   . Stroke ([place])   . TIA (transient ischemic attack)   . Tobacco abuse   . Urge incontinence    Past Surgical History:  Procedure Laterality Date  . EYE SURGERY     at age 11  . hysterectomy -age 13    . ILIAC ARTERY STENT     RIGHT ILIAC STENT  . KNEE ARTHROSCOPY    . LUMBAR LAMINECTOMY    . TUBAL LIGATION      Family History  Problem Relation Age of Onset  . Alzheimer's disease Mother   . Cancer Brother   . Heart disease Other        Grandfather  . Hepatitis C Brother   . Diabetes Brother   . Thyroid disease Brother   . Hypertension Brother   . Depression Brother    Social History   Socioeconomic History  . Marital status: Divorced    Spouse name: Not on file  . Number of children: 3  . Years of education: 10 th  . Highest education level: Not on file  Occupational History  . Occupation: DISABLED    Employer: UNEMPLOYED  Tobacco Use  . Smoking status: Current Every Day Smoker     Packs/day: 0.50    Types: Cigarettes    Last attempt to quit: 11/20/2012    Years since quitting: 8.0  . Smokeless tobacco: Never Used  Substance and Sexual Activity  . Alcohol use: No    Alcohol/week: 0.0 standard drinks  . Drug use: No  . Sexual activity: Never  Other Topics Concern  . Not on file  Social History Narrative   Patient is single with 3 children, 1 deceased.   Patient is right handed.   Patient has 10 th  grade education.   Patient drinks 5 or more cups daily.   Social Determinants of Health   Financial Resource Strain: Not on file  Food Insecurity: No Food Insecurity  . Worried About Charity fundraiser in the Last Year: Never true  . Ran Out of Food in the Last Year: Never true  Transportation Needs: No Transportation Needs  . Lack of Transportation (Medical): No  . Lack of Transportation (Non-Medical): No  Physical Activity: Not on file  Stress: Stress Concern Present  . Feeling of Stress : Rather much  Social Connections: Not on file    Review of Systems  Constitutional: Negative for chills, fatigue and fever.  HENT: Negative for congestion, ear pain, rhinorrhea and sore throat.   Respiratory: Positive for cough. Negative for shortness of breath.   Cardiovascular: Negative for chest pain.  Gastrointestinal: Positive for constipation (Large hard stools size of golf balls. Tried equate fiber capsules twice a day. Helps some.). Negative for abdominal pain, diarrhea, nausea and vomiting.  Genitourinary: Negative for dysuria and urgency.  Musculoskeletal: Positive for back pain. Negative for myalgias.  Neurological: Positive for headaches. Negative for dizziness, weakness and light-headedness.  Psychiatric/Behavioral: Positive for dysphoric mood. The patient is not nervous/anxious.      Objective:  BP 134/76   Pulse 93   Temp (!) 96.6 F (35.9 C)   Ht 5\' 5"  (1.651 m)   Wt 165 lb (74.8 kg)   SpO2 99%   BMI 27.46 kg/m   BP/Weight 11/25/2020 10/27/2020  0/06/2724  Systolic BP 366 440 347  Diastolic BP 76 84 71  Wt. (Lbs) 165 163 167  BMI 27.46 27.12 27.79    Physical Exam Vitals reviewed.  Constitutional:      Appearance: Normal appearance. She is normal weight.  Neck:     Vascular: No carotid bruit.  Cardiovascular:     Rate and Rhythm: Normal rate and regular rhythm.     Pulses: Normal pulses.     Heart sounds: Normal heart sounds.  Pulmonary:     Effort: Pulmonary effort is normal. No respiratory distress.     Breath sounds: Normal breath sounds.  Abdominal:     General: Abdomen is flat. Bowel sounds are normal.     Palpations: Abdomen is soft.     Tenderness: There is no abdominal tenderness.  Musculoskeletal:        General: Tenderness (Left thoracic paraspinal muscles.  Mild tenderness over vertebrae.  ) present.  Neurological:     Mental Status: She is alert and oriented to person, place, and time.  Psychiatric:        Mood and Affect: Mood normal.        Behavior: Behavior normal.     Diabetic Foot Exam - Simple   Simple Foot Form Diabetic Foot exam was performed with the following findings: Yes 11/25/2020  8:21 AM  Visual Inspection See comments: Yes Sensation Testing Intact to touch and monofilament testing bilaterally: Yes Pulse Check Posterior Tibialis and Dorsalis pulse intact bilaterally: Yes Comments Patient has a mild deformity on the dorsal surface of her feet which is symmetrical and mildly tender.  Consistent with midfoot osteoarthritis      Lab Results  Component Value Date   WBC 7.2 10/19/2020   HGB 13.9 10/19/2020   HCT 44.1 10/19/2020   PLT 239 10/19/2020   GLUCOSE 179 (H) 10/19/2020   CHOL 160 08/17/2020   TRIG 150 (H) 08/17/2020   HDL 41 08/17/2020  LDLCALC 93 08/17/2020   ALT 19 10/19/2020   AST 25 10/19/2020   NA 143 10/19/2020   K 3.6 10/19/2020   CL 106 10/19/2020   CREATININE 0.70 10/19/2020   BUN 16 10/19/2020   CO2 27 10/19/2020   TSH 0.619 12/04/2019   INR 1.0  01/31/2019   HGBA1C 6.6 (H) 08/17/2020   MICROALBUR 30 12/04/2019      Assessment & Plan:   1. Essential hypertension The current medical regimen is effective;  continue present plan and medications. - Comprehensive metabolic panel  2. Diabetic polyneuropathy associated with type 2 diabetes mellitus (HCC) Control: good Recommend check sugars fasting daily. Recommend check feet daily. Recommend annual eye exams. Medicines: no changes Continue to work on eating a healthy diet and exercise.  Labs drawn today.   - Hemoglobin A1c - CBC with Differential/Platelet - POCT UA - Microalbumin  3. Simple chronic bronchitis (HCC) The current medical regimen is effective;  continue present plan and medications.  4. Mixed hyperlipidemia Well controlled.  No changes to medicines.  Continue to work on eating a healthy diet and exercise.  Labs drawn today.  - Lipid panel  5. Other fatigue - Labs checked.  6. Hematuria, unspecified type Patient had left when UA came back. We tried to reach her all morning to get a ct urogram. She called back at 11:30 approximately. Offered to order ct urogram today or treat her empirically with flomax 0.4 mg one twice a day and LOTS of fluids over the weekend. If she worsens, she can go to the ED. If no better by Monday,call and will order ct urogram.   - POCT URINALYSIS DIP (CLINITEK) 3+ blood. TNTC RBCs on microscopic. Casts seen. No wbcs. - Urine Culture  7. GAD/Major depression, moderate, recurrent.  Recommend continue effexor xr and xanax.  - ALPRAZolam (XANAX) 0.5 MG tablet; Take 1 tablet (0.5 mg total) by mouth 3 (three) times daily.  Dispense: 90 tablet; Refill: 0  Meds ordered this encounter  Medications  . ALPRAZolam (XANAX) 0.5 MG tablet    Sig: Take 1 tablet (0.5 mg total) by mouth 3 (three) times daily.    Dispense:  90 tablet    Refill:  0    Not to exceed 3 additional fills before 05/30/2020.    Orders Placed This Encounter   Procedures  . Lipid panel  . Hemoglobin A1c  . Comprehensive metabolic panel  . CBC with Differential/Platelet  . POCT UA - Microalbumin    Follow-up: Return in about 3 months (around 02/25/2021) for fasting.  An After Visit Summary was printed and given to the patient.  [name], MD [facility] [phone]

## 2020-11-25 ENCOUNTER — Other Ambulatory Visit: Payer: Self-pay

## 2020-11-25 ENCOUNTER — Encounter: Payer: Self-pay | Admitting: Family Medicine

## 2020-11-25 ENCOUNTER — Telehealth: Payer: Self-pay

## 2020-11-25 ENCOUNTER — Ambulatory Visit (INDEPENDENT_AMBULATORY_CARE_PROVIDER_SITE_OTHER): Payer: Medicare Other | Admitting: Family Medicine

## 2020-11-25 VITALS — BP 134/76 | HR 93 | Temp 96.6°F | Ht 65.0 in | Wt 165.0 lb

## 2020-11-25 DIAGNOSIS — R5383 Other fatigue: Secondary | ICD-10-CM

## 2020-11-25 DIAGNOSIS — I10 Essential (primary) hypertension: Secondary | ICD-10-CM

## 2020-11-25 DIAGNOSIS — R3121 Asymptomatic microscopic hematuria: Secondary | ICD-10-CM | POA: Diagnosis not present

## 2020-11-25 DIAGNOSIS — E1142 Type 2 diabetes mellitus with diabetic polyneuropathy: Secondary | ICD-10-CM | POA: Diagnosis not present

## 2020-11-25 DIAGNOSIS — J410 Simple chronic bronchitis: Secondary | ICD-10-CM

## 2020-11-25 DIAGNOSIS — F331 Major depressive disorder, recurrent, moderate: Secondary | ICD-10-CM

## 2020-11-25 DIAGNOSIS — E782 Mixed hyperlipidemia: Secondary | ICD-10-CM | POA: Diagnosis not present

## 2020-11-25 LAB — POCT URINALYSIS DIP (CLINITEK)
Bilirubin, UA: NEGATIVE
Glucose, UA: NEGATIVE mg/dL
Ketones, POC UA: NEGATIVE mg/dL
Leukocytes, UA: NEGATIVE
Nitrite, UA: NEGATIVE
Spec Grav, UA: 1.025 (ref 1.010–1.025)
Urobilinogen, UA: 0.2 E.U./dL
pH, UA: 5.5 (ref 5.0–8.0)

## 2020-11-25 LAB — POCT UA - MICROALBUMIN: Microalbumin Ur, POC: 80 mg/L

## 2020-11-25 LAB — POCT UA - MICROSCOPIC ONLY
Bacteria, U Microscopic: 0
Crystals, Ur, HPF, POC: 0
Epithelial cells, urine per micros: 0
Mucus, UA: 0
WBC, Ur, HPF, POC: 0 (ref 0–5)

## 2020-11-25 MED ORDER — ALPRAZOLAM 0.5 MG PO TABS: 0.5000 mg | ORAL_TABLET | Freq: Three times a day (TID) | ORAL | 0 refills | Status: DC

## 2020-11-25 MED ORDER — TAMSULOSIN HCL 0.4 MG PO CAPS: 0.4000 mg | ORAL_CAPSULE | Freq: Two times a day (BID) | ORAL | 0 refills | Status: DC

## 2020-11-25 NOTE — Patient Instructions (Addendum)
Recommend shingrix vaccination Recommend TDAP (tetanus and pertussis vaccine)  For constipation: Continue fiber supplement as well as start on MiraLAX 1 dose twice daily.  For back pain: May take tizanidine up to 3 times a day however I did caution her due to the drowsiness it can cause.

## 2020-11-25 NOTE — Telephone Encounter (Signed)
Sonya Small called back and does not want to do the CT scan today.  She would prefer to drink lots of fluids and begin flomax 0.4 mg twice daily for 7 days.  She was instructed to call us back on Monday for follow-up. She was advised to go the the ED or Urgent Care if she develops fever, chills, difficulty voiding or increased pain.

## 2020-11-26 LAB — COMPREHENSIVE METABOLIC PANEL
ALT: 15 IU/L (ref 0–32)
AST: 16 IU/L (ref 0–40)
Albumin/Globulin Ratio: 1.8 (ref 1.2–2.2)
Albumin: 4.3 g/dL (ref 3.7–4.7)
Alkaline Phosphatase: 84 IU/L (ref 44–121)
BUN/Creatinine Ratio: 25 (ref 12–28)
BUN: 20 mg/dL (ref 8–27)
Bilirubin Total: <0.2 mg/dL (ref 0.0–1.2)
CO2: 23 mmol/L (ref 20–29)
Calcium: 9.5 mg/dL (ref 8.7–10.3)
Chloride: 104 mmol/L (ref 96–106)
Creatinine, Ser: 0.79 mg/dL (ref 0.57–1.00)
Globulin, Total: 2.4 g/dL (ref 1.5–4.5)
Glucose: 133 mg/dL — ABNORMAL HIGH (ref 65–99)
Potassium: 4.3 mmol/L (ref 3.5–5.2)
Sodium: 144 mmol/L (ref 134–144)
Total Protein: 6.7 g/dL (ref 6.0–8.5)
eGFR: 80 mL/min/{1.73_m2} (ref >59)

## 2020-11-26 LAB — CBC WITH DIFFERENTIAL/PLATELET
Basophils Absolute: 0 10*3/uL (ref 0.0–0.2)
Basos: 0 %
EOS (ABSOLUTE): 0.2 10*3/uL (ref 0.0–0.4)
Eos: 2 %
Hematocrit: 44.4 % (ref 34.0–46.6)
Hemoglobin: 14.4 g/dL (ref 11.1–15.9)
Immature Grans (Abs): 0.1 10*3/uL (ref 0.0–0.1)
Immature Granulocytes: 1 %
Lymphocytes Absolute: 2.3 10*3/uL (ref 0.7–3.1)
Lymphs: 25 %
MCH: 28 pg (ref 26.6–33.0)
MCHC: 32.4 g/dL (ref 31.5–35.7)
MCV: 86 fL (ref 79–97)
Monocytes Absolute: 0.6 10*3/uL (ref 0.1–0.9)
Monocytes: 7 %
Neutrophils Absolute: 5.9 10*3/uL (ref 1.4–7.0)
Neutrophils: 65 %
Platelets: 250 10*3/uL (ref 150–450)
RBC: 5.15 x10E6/uL (ref 3.77–5.28)
RDW: 12.3 % (ref 11.7–15.4)
WBC: 9.1 10*3/uL (ref 3.4–10.8)

## 2020-11-26 LAB — LIPID PANEL
Chol/HDL Ratio: 2.4 ratio (ref 0.0–4.4)
Cholesterol, Total: 122 mg/dL (ref 100–199)
HDL: 50 mg/dL (ref >39)
LDL Chol Calc (NIH): 54 mg/dL (ref 0–99)
Triglycerides: 92 mg/dL (ref 0–149)
VLDL Cholesterol Cal: 18 mg/dL (ref 5–40)

## 2020-11-26 LAB — CARDIOVASCULAR RISK ASSESSMENT

## 2020-11-26 LAB — HEMOGLOBIN A1C
Est. average glucose Bld gHb Est-mCnc: 131 mg/dL
Hgb A1c MFr Bld: 6.2 % — ABNORMAL HIGH (ref 4.8–5.6)

## 2020-11-27 LAB — URINE CULTURE

## 2020-11-28 ENCOUNTER — Other Ambulatory Visit: Payer: Self-pay

## 2020-11-28 DIAGNOSIS — R319 Hematuria, unspecified: Secondary | ICD-10-CM

## 2020-11-29 ENCOUNTER — Other Ambulatory Visit (INDEPENDENT_AMBULATORY_CARE_PROVIDER_SITE_OTHER): Payer: Medicare Other

## 2020-11-29 DIAGNOSIS — R319 Hematuria, unspecified: Secondary | ICD-10-CM | POA: Diagnosis not present

## 2020-11-29 LAB — POCT URINALYSIS DIPSTICK
Bilirubin, UA: NEGATIVE
Glucose, UA: NEGATIVE
Ketones, UA: NEGATIVE
Leukocytes, UA: NEGATIVE
Nitrite, UA: NEGATIVE
Protein, UA: NEGATIVE
Spec Grav, UA: 1.025 (ref 1.010–1.025)
Urobilinogen, UA: 0.2 E.U./dL
pH, UA: 6 (ref 5.0–8.0)

## 2020-12-01 ENCOUNTER — Other Ambulatory Visit: Payer: Self-pay

## 2020-12-01 ENCOUNTER — Telehealth: Payer: Self-pay

## 2020-12-01 DIAGNOSIS — R319 Hematuria, unspecified: Secondary | ICD-10-CM

## 2020-12-01 NOTE — Telephone Encounter (Signed)
Per Dr. [name]:  Please see my note on this patient. Patient needs to have a CT urogram if having symptoms of a kidney stone. She still has blood in her urine. If she is not having symptoms of a kidney stone, I would recommend referral to a urologist.    Patient denies any symptoms. No back pain, difficulty urinating, abd pain, no dysuria states she feels fine. Order for Urologist placed.

## 2020-12-03 ENCOUNTER — Other Ambulatory Visit: Payer: Self-pay | Admitting: Family Medicine

## 2020-12-03 DIAGNOSIS — R3121 Asymptomatic microscopic hematuria: Secondary | ICD-10-CM

## 2020-12-03 NOTE — Progress Notes (Signed)
Cancelled.  

## 2020-12-05 ENCOUNTER — Other Ambulatory Visit: Payer: Self-pay

## 2020-12-05 DIAGNOSIS — R319 Hematuria, unspecified: Secondary | ICD-10-CM

## 2020-12-16 NOTE — Progress Notes (Signed)
Cardiology Office Note    Date:  12/19/2020   ID:  [name], [name] 05/13/1951, MRN [id number]  PCP:  [name], MD  Cardiologist:  Dr. [name]  Chief Complaint  Patient presents with  . Coronary Artery Disease    History of Present Illness:  Sonya Small is a 70 y.o. female with PMH of HTN, HLD, OSA, PAD, CVA, chronic diastolic heart failureand CAD. Patient had a NSTEMI in March 2010 and underwent DES to left circumflex.She had anotherNSTEMI in May 2011 secondary to thrombotic RCA lesion, cardiac catheterization showed nonobstructive disease, medical therapy was recommended. She was admitted for recurrent chest pain in January 2014, Myoview was negative. She was admitted for acute CVA in October 2015 after presenting with slurred speech and facial drooping. MRI showed acute right MCA infarct involving basal ganglia and periventricular white matter. She was started on aspirin along with Plavix. Myoview obtained in August 2017 showed no ischemia, normal EF.  ABI obtained in July 2018 was normal.   She was seen by Almyra Deforest PA-C on 03/13/2018  with chest pain.  A Myoview study on 03/25/2018 which showed EF 52%, small sized moderate intensity fixed apical perfusion defect likely attenuation artifact was seen, no reversible ischemia otherwise.  Overall this is a low risk stress test.  Seen again on 05/22/2018, he was having a lot of dizziness. She was noted to be taking different doses of medication than what was prescribed. BP was stable. Polypharmacy with use of Xanax, oxycodone, Tizanidiine, Zoloft. Dizziness felt to be more related to medication than to a vascular issue. Carotid dopplers were checked and were OK.  She was admitted in May 2020 with PNA and respiratory failure requiring ventilator support. Treated with antibiotics and breathing treatments with improvement. Still smoking.  She was seen in the ED on 10/19/20 with acute mid thoracic pain. Started left scapula and moved to  the right. She states Ntg did help. Troponin was normal x 2. Ecg showed mild ST depression in leads V5-6. CXR normal. Other labs normal. Pain lasted about 30 minutes.  Since then her pain has resolved.    On follow up today she is doing OK. She still complains of neuropathy in her feet.  Had a toenail removed- ingrown. Had a cyst lanced on her foot today. Has hematuria. Told she has large kidney stones bilaterally.  Prior LE dopplers in January showed no significant PAD. She denies any chest pain or SOB. She still complains of some thoracic pain and hip pain. Takes a Percocet twice a day and is using Goody powders 3x/day.    Past Medical History:  Diagnosis Date  . Anxiety   . CAD (coronary artery disease)    a. NSTEMI 11/2008 s/p DES to LCx (3.0x12 Xience); b. NSTEMI 01/2010 secondary to thrombotic RCA lesion (non-obstructive)-->med rx (integrilin x 24 hrs + plavix); c. 09/2012 negative Myoview.  . Chronic diastolic CHF (congestive heart failure) ([place])    a. 06/2014 Echo: EF 55-60%, no rwma, Gr1 DD, mild AI.  [name] Depression   . Dizziness   . Drug induced constipation   . Generalized hyperhidrosis   . GERD (gastroesophageal reflux disease)   . Headache   . Hyperlipidemia   . Hypertensive heart disease   . Lumbar disc disease   . Metabolic encephalopathy   . Mixed hyperlipidemia   . Myocardial infarction ([place])   . Obstructive sleep apnea   . OP (osteoporosis)   . Osteoarthritis   . Osteoporosis   .  Other malaise   . Overweight(278.02)   . PAD (peripheral artery disease) ([place])    a. Emboli to R foot 2010 from partially occlusive lesion in R EIA, s/p stenting. - followed by Dr. Donnetta Hutching;  b. 10/2015 ABIs: R 1.03, L 0.97.  [name] Restless leg   . Sleep apnea   . Stroke ([place])   . TIA (transient ischemic attack)   . Tobacco abuse   . Urge incontinence     Past Surgical History:  Procedure Laterality Date  . EYE SURGERY     at age 44  . hysterectomy -age 73    . ILIAC ARTERY STENT     RIGHT  ILIAC STENT  . KNEE ARTHROSCOPY    . LUMBAR LAMINECTOMY    . TUBAL LIGATION      Current Medications: Outpatient Medications Prior to Visit  Medication Sig Dispense Refill  . ALPRAZolam (XANAX) 0.5 MG tablet Take 1 tablet (0.5 mg total) by mouth 3 (three) times daily. 90 tablet 0  . Aspirin-Acetaminophen (GOODYS BODY PAIN PO) Take 1 packet by mouth daily.    . clopidogrel (PLAVIX) 75 MG tablet TAKE ONE TABLET BY MOUTH EVERY MORNING 90 tablet 2  . diclofenac Sodium (VOLTAREN) 1 % GEL     . Dulaglutide (TRULICITY) 3 EQ/6.8TM SOPN Inject 3 mg as directed once a week. 4 mL 2  . fenofibrate 160 MG tablet TAKE ONE TABLET BY MOUTH EVERY MORNING 90 tablet 2  . fluticasone (FLONASE) 50 MCG/ACT nasal spray PLACE ONE SPRAY INTO BOTH NOSTRILS DAILY 16 g 1  . Fluticasone-Salmeterol,sensor, (AIRDUO DIGIHALER) 113-14 MCG/ACT AEPB Inhale 1 puff into the lungs in the morning and at bedtime.    [name] glucose blood (ONETOUCH VERIO) test strip USE DAILY TO CHECK YOUR BLOOD SUGAR (E11.69) 100 each 3  . lamoTRIgine (LAMICTAL) 100 MG tablet TAKE ONE TABLET BY MOUTH EVERYDAY AT BEDTIME 30 tablet 1  . lansoprazole (PREVACID) 30 MG capsule Take 1 capsule (30 mg total) by mouth every morning. 90 capsule 1  . lisinopril-hydrochlorothiazide (ZESTORETIC) 10-12.5 MG tablet TAKE ONE TABLET BY MOUTH EVERY MORNING 90 tablet 2  . nitroGLYCERIN (NITROSTAT) 0.4 MG SL tablet Place 1 tablet (0.4 mg total) under the tongue every 5 (five) minutes as needed. 25 tablet 1  . ONETOUCH VERIO test strip USE DAILY TO CHECK YOUR BLOOD SUGAR (E11.69) Test 1 x daily 100 each 3  . oxyCODONE-acetaminophen (PERCOCET) 10-325 MG tablet Take 1 tablet by mouth every 6 (six) hours as needed.    . rosuvastatin (CRESTOR) 20 MG tablet TAKE ONE TABLET BY MOUTH EVERYDAY AT BEDTIME 90 tablet 2  . tamsulosin (FLOMAX) 0.4 MG CAPS capsule Take 1 capsule (0.4 mg total) by mouth 2 (two) times daily. 14 capsule 0  . TRUEplus Lancets 30G MISC 1 each by Does not  apply route 2 (two) times daily. 100 each 2  . valACYclovir (VALTREX) 1000 MG tablet TAKE TWO TABLETS BY MOUTH twice A DAY FOR ONE DAY as needed 10 tablet 2  . VASCEPA 1 g capsule TAKE TWO CAPSULES BY MOUTH EVERY MORNING and TAKE TWO TABLETS BY MOUTH EVERYDAY AT BEDTIME 360 capsule 2  . venlafaxine XR (EFFEXOR-XR) 75 MG 24 hr capsule Take 1 capsule (75 mg total) by mouth every morning. 90 capsule 1   No facility-administered medications prior to visit.     Allergies:   Abilify [aripiprazole]   Social History   Socioeconomic History  . Marital status: Divorced    Spouse name: Not  on file  . Number of children: 3  . Years of education: 10 th  . Highest education level: Not on file  Occupational History  . Occupation: DISABLED    Employer: UNEMPLOYED  Tobacco Use  . Smoking status: Current Every Day Smoker    Packs/day: 0.50    Types: Cigarettes    Last attempt to quit: 11/20/2012    Years since quitting: 8.0  . Smokeless tobacco: Never Used  Substance and Sexual Activity  . Alcohol use: No    Alcohol/week: 0.0 standard drinks  . Drug use: No  . Sexual activity: Never  Other Topics Concern  . Not on file  Social History Narrative   Patient is single with 3 children, 1 deceased.   Patient is right handed.   Patient has 10 th grade education.   Patient drinks 5 or more cups daily.   Social Determinants of Health   Financial Resource Strain: Not on file  Food Insecurity: No Food Insecurity  . Worried About Charity fundraiser in the Last Year: Never true  . Ran Out of Food in the Last Year: Never true  Transportation Needs: No Transportation Needs  . Lack of Transportation (Medical): No  . Lack of Transportation (Non-Medical): No  Physical Activity: Not on file  Stress: Stress Concern Present  . Feeling of Stress : Rather much  Social Connections: Not on file     Family History:  The patient's family history includes Alzheimer's disease in her mother; Cancer in her  brother; Depression in her brother; Diabetes in her brother; Heart disease in an other family member; Hepatitis C in her brother; Hypertension in her brother; Thyroid disease in her brother.   ROS:   Please see the history of present illness.    ROS All other systems reviewed and are negative.   PHYSICAL EXAM:   VS:  BP (!) 152/84 (BP Location: Left Arm, Patient Position: Sitting)   Pulse 94   Ht 5\' 5"  (1.651 m)   Wt 169 lb (76.7 kg)   SpO2 95%   BMI 28.12 kg/m    GEN: Well nourished, well developed, in no acute distress  HEENT: normal  Neck: no JVD, carotid bruits, or masses Cardiac: RRR; no murmurs, rubs, or gallops,no edema  Respiratory:  clear to auscultation bilaterally, normal work of breathing GI: soft, nontender, nondistended, + BS MS: no deformity or atrophy  Skin: warm and dry, no rash Neuro:  Alert and Oriented x 3, Strength and sensation are intact Psych: euthymic mood, full affect  Wt Readings from Last 3 Encounters:  12/19/20 169 lb (76.7 kg)  11/25/20 165 lb (74.8 kg)  10/27/20 163 lb (73.9 kg)      Studies/Labs Reviewed:   EKG:  EKG is not ordered today.    Recent Labs: 11/25/2020: ALT 15; BUN 20; Creatinine, Ser 0.79; Hemoglobin 14.4; Platelets 250; Potassium 4.3; Sodium 144   Lipid Panel    Component Value Date/Time   CHOL 122 11/25/2020 0814   TRIG 92 11/25/2020 0814   HDL 50 11/25/2020 0814   CHOLHDL 2.4 11/25/2020 0814   CHOLHDL 5.0 07/13/2014 0108   VLDL 42 (H) 07/13/2014 0108   LDLCALC 54 11/25/2020 0814   Labs dated 06/17/18: CBC normal except for elevated WBC 13.3.  Creatinine 1.14. Potassium 5.5.  Dated 05/23/18: cholesterol 142, triglycerides 212, HDL 35, LDL 65.  Dated 07/29/18: Normal chemistries and CBC.  Dated 09/02/19: cholesterol 165, triglycerides 146, HDL 49, LDL 62. A1c 7%.  Creatinine 0.56. potassium 3.3. CBC and TSH normal.  Additional studies/ records that were reviewed today include:   Myoview 03/25/2018  The left ventricular  ejection fraction is mildly decreased (45-54%).  Nuclear stress EF: 52%.  No T wave inversion was noted during stress.  There was no ST segment deviation noted during stress.  Defect 1: There is a small defect of moderate severity.  This is a low risk study.   Small size, moderate intensity fixed apical perfusion defect, likely attenuation artifact. No reversible ischemia. LVEF 52% with normal wall motion. This is a low risk study.    ASSESSMENT:    1. Coronary artery disease involving native coronary artery of native heart with angina pectoris ([place])   2. PAD (peripheral artery disease) ([place])   3. Tobacco abuse   4. Essential hypertension   5. Hyperlipidemia, unspecified hyperlipidemia type      PLAN:  In order of problems listed above:  1. CAD: really asymptomatic. Marland KitchenMyoview in August 2019 was low risk.  Continue aspirin and Plavix. I told her to quit taking Goody powders and limit ASA to 81 mg daily. Each Gabriel Earing powder has 525 mg of ASA along with caffeine.    2. Hypertension: Blood pressure is well controlled.  3. Carotid artery disease: She has a history of mild carotid artery disease.  4. Chronic diastolic heart failure: Appears to be euvolemic on physical exam. Continue lasix 20 mg daily  5.   Dyslipidemia. On Crestor and Vascepa. Excellent control.   6.   Tobacco abuse. Recommend smoking cessation.       Medication Adjustments/Labs and Tests Ordered: Current medicines are reviewed at length with the patient today.  Concerns regarding medicines are outlined above.  Medication changes, Labs and Tests ordered today are listed in the Patient Instructions below. Patient Instructions  Stop taking Goody powder and take ASA 81 mg one daily.      Signed, [name] [name], MD  12/19/2020 1:52 PM    [facility], [place], [place]  [address] Phone: [phone]; Fax: [phone]

## 2020-12-19 ENCOUNTER — Encounter: Payer: Self-pay | Admitting: Cardiology

## 2020-12-19 ENCOUNTER — Ambulatory Visit (INDEPENDENT_AMBULATORY_CARE_PROVIDER_SITE_OTHER): Payer: Medicare Other | Admitting: Cardiology

## 2020-12-19 ENCOUNTER — Other Ambulatory Visit: Payer: Self-pay

## 2020-12-19 VITALS — BP 152/84 | HR 94 | Ht 65.0 in | Wt 169.0 lb

## 2020-12-19 DIAGNOSIS — I739 Peripheral vascular disease, unspecified: Secondary | ICD-10-CM | POA: Diagnosis not present

## 2020-12-19 DIAGNOSIS — E785 Hyperlipidemia, unspecified: Secondary | ICD-10-CM

## 2020-12-19 DIAGNOSIS — Z720 Tobacco use: Secondary | ICD-10-CM | POA: Diagnosis not present

## 2020-12-19 DIAGNOSIS — I25119 Atherosclerotic heart disease of native coronary artery with unspecified angina pectoris: Secondary | ICD-10-CM | POA: Diagnosis not present

## 2020-12-19 DIAGNOSIS — I10 Essential (primary) hypertension: Secondary | ICD-10-CM

## 2020-12-19 NOTE — Patient Instructions (Signed)
Stop taking Goody powder and take ASA 81 mg one daily.

## 2020-12-20 ENCOUNTER — Telehealth: Payer: Self-pay

## 2020-12-20 NOTE — Progress Notes (Addendum)
Chronic Care Management Pharmacy Assistant   Name: EDYNN GILLOCK  MRN: [id number] DOB: 26-Sep-1950    Reason for Encounter: Medication Review for Upstream Delivery      Medications: Outpatient Encounter Medications as of 12/20/2020  Medication Sig   ALPRAZolam (XANAX) 0.5 MG tablet Take 1 tablet (0.5 mg total) by mouth 3 (three) times daily.   Aspirin-Acetaminophen (GOODYS BODY PAIN PO) Take 1 packet by mouth daily.   clopidogrel (PLAVIX) 75 MG tablet TAKE ONE TABLET BY MOUTH EVERY MORNING   diclofenac Sodium (VOLTAREN) 1 % GEL    Dulaglutide (TRULICITY) 3 GE/9.5MW SOPN Inject 3 mg as directed once a week.   fenofibrate 160 MG tablet TAKE ONE TABLET BY MOUTH EVERY MORNING   fluticasone (FLONASE) 50 MCG/ACT nasal spray PLACE ONE SPRAY INTO BOTH NOSTRILS DAILY   Fluticasone-Salmeterol,sensor, (AIRDUO DIGIHALER) 113-14 MCG/ACT AEPB Inhale 1 puff into the lungs in the morning and at bedtime.   glucose blood (ONETOUCH VERIO) test strip USE DAILY TO CHECK YOUR BLOOD SUGAR (E11.69)   lamoTRIgine (LAMICTAL) 100 MG tablet TAKE ONE TABLET BY MOUTH EVERYDAY AT BEDTIME   lansoprazole (PREVACID) 30 MG capsule Take 1 capsule (30 mg total) by mouth every morning.   lisinopril-hydrochlorothiazide (ZESTORETIC) 10-12.5 MG tablet TAKE ONE TABLET BY MOUTH EVERY MORNING   nitroGLYCERIN (NITROSTAT) 0.4 MG SL tablet Place 1 tablet (0.4 mg total) under the tongue every 5 (five) minutes as needed.   ONETOUCH VERIO test strip USE DAILY TO CHECK YOUR BLOOD SUGAR (E11.69) Test 1 x daily   oxyCODONE-acetaminophen (PERCOCET) 10-325 MG tablet Take 1 tablet by mouth every 6 (six) hours as needed.   rosuvastatin (CRESTOR) 20 MG tablet TAKE ONE TABLET BY MOUTH EVERYDAY AT BEDTIME   tamsulosin (FLOMAX) 0.4 MG CAPS capsule Take 1 capsule (0.4 mg total) by mouth 2 (two) times daily.   TRUEplus Lancets 30G MISC 1 each by Does not apply route 2 (two) times daily.   valACYclovir (VALTREX) 1000 MG tablet TAKE TWO TABLETS  BY MOUTH twice A DAY FOR ONE DAY as needed   VASCEPA 1 g capsule TAKE TWO CAPSULES BY MOUTH EVERY MORNING and TAKE TWO TABLETS BY MOUTH EVERYDAY AT BEDTIME   No facility-administered encounter medications on file as of 12/20/2020.    Reviewed chart for medication changes ahead of medication coordination call.  No OVs, Consults, or hospital visits since last care coordination call/Pharmacist visit. (If appropriate, list visit date, provider name)  No medication changes indicated OR if recent visit, treatment plan here.  BP Readings from Last 3 Encounters:  12/19/20 (!) 152/84  11/25/20 134/76  10/27/20 124/84    Lab Results  Component Value Date   HGBA1C 6.2 (H) 11/25/2020     Patient obtains medications through Adherence Packaging  30 Days   Last adherence delivery included:  Clopidogrel 75mg - Breakfast Fenofibrate 160mg -breakfast Lamotrigine 100mg  --bedtime Lansoprazole 30mg - before breakfast Lisinopril HCTZ 10-12.5-breakfst Rosuvastatin 20mg -bedtime Trulicity pens Vascepa 1G- 2 breakfast  2 evening meal  Venlafaxine XR 75mg -breakfast   Patient declined (meds) last month due to PRN use/additional supply on hand. Flonase- had enough    Patient is due for next adherence delivery on: 4/6 Called patient and reviewed medications and coordinated delivery.  This delivery to include: Clopidogrel 75mg - Breakfast Fenofibrate 160mg -breakfast Lamotrigine 100mg  --bedtime Lansoprazole 30mg - before breakfast Lisinopril HCTZ 10-12.5-breakfst Rosuvastatin 20mg -bedtime Trulicity pens Vascepa 1G- 2 breakfast  2 evening meal   Patient declined the following medications (meds) due to (reason) Flonase-recently got a  fill Venlafaxine was discontinued by cardiology  Patient needs refills for  none  Confirmed delivery date of 12/29/20, advised patient that pharmacy will contact them the morning of delivery.  Clarita Leber, [place] Pharmacist Assistant [phone]

## 2020-12-23 HISTORY — PX: LITHOTRIPSY: SUR834

## 2021-01-09 ENCOUNTER — Other Ambulatory Visit: Payer: Self-pay

## 2021-01-09 DIAGNOSIS — R5383 Other fatigue: Secondary | ICD-10-CM

## 2021-01-09 MED ORDER — ALPRAZOLAM 0.5 MG PO TABS: 0.5000 mg | ORAL_TABLET | Freq: Three times a day (TID) | ORAL | 0 refills | Status: DC

## 2021-01-11 ENCOUNTER — Telehealth: Payer: Self-pay | Admitting: Cardiology

## 2021-01-11 NOTE — Telephone Encounter (Signed)
OK to hold Plavix for procedure  Nyana Haren [name] MD, [facility]

## 2021-01-11 NOTE — Telephone Encounter (Signed)
   [facility] Pre-operative Risk Assessment     HEARTCARE STAFF: - Please ensure there is not already an duplicate clearance open for this procedure. - Under Visit Info/Reason for Call, type in Other and utilize the format Clearance MM/DD/YY or Clearance TBD. Do not use dashes or single digits. - If request is for dental extraction, please clarify the # of teeth to be extracted.  Request for surgical clearance:  1. What type of surgery is being performed? lithotripsy  2. When is this surgery scheduled? 01/19/2021  3. What type of clearance is required (medical clearance vs. Pharmacy clearance to hold med vs. Both)? Medical  4. Are there any medications that need to be held prior to surgery and how long?Playvix and 7 days restart at doctor's discretion.   Practice name and name of physician performing surgery? [facility] and [place] 5. What is the office phone number? [phone]   7.   What is the office fax number? [phone]  8.   Anesthesia type (None, local, MAC, general) ? general   Lars Pinks 01/11/2021, 11:57 AM  _________________________________________________________________   (provider comments below)

## 2021-01-11 NOTE — Telephone Encounter (Signed)
   Name: Sonya Small  DOB: 1951/07/23  MRN: [id number]   Primary Cardiologist: [name] [name], MD  Chart reviewed as part of pre-operative protocol coverage. Patient was contacted 01/11/2021 in reference to pre-operative risk assessment for pending surgery as outlined below.  Sonya Small was last seen on 12/19/2020 by Dr. [name].  Since that day, Sonya Small has done well without any chest pain or worsening dyspnea.   Therefore, based on ACC/AHA guidelines, the patient would be at acceptable risk for the planned procedure without further cardiovascular testing.   The patient was advised that if she develops new symptoms prior to surgery to contact our office to arrange for a follow-up visit, and she verbalized understanding.  I will route this recommendation to the requesting party via Epic fax function and remove from pre-op pool. Please call with questions.  Dr. [name], please verify if it is okay from your perspective to hold Plavix for 7 days prior to the procedure.  Please forward your response to P CV [place], PA 01/11/2021, 12:22 PM

## 2021-01-16 ENCOUNTER — Telehealth: Payer: Self-pay

## 2021-01-16 ENCOUNTER — Other Ambulatory Visit: Payer: Self-pay

## 2021-01-16 MED ORDER — LAMOTRIGINE 100 MG PO TABS: ORAL_TABLET | ORAL | 1 refills | Status: DC

## 2021-01-16 NOTE — Progress Notes (Signed)
Chronic Care Management Pharmacy Assistant   Name: Sonya Small  MRN: [id number] DOB: 1951/01/11  Reason for Encounter: Medication coordination for Upstream   Recent office visits:  01/11/21-Cardiology, hold Plavix 7 days prior to procedure, she is having cyst on foot removed and Kidney stone on April 29th, 2022  12/19/20-Cardiology, CAD, stop Venlafaxine, patient was advised to stop Sonya Small and take ASA 81mg  one per day  Recent consult visits:  None  Hospital visits:  10/19/20- ED Midline thoracic back pain, Work up not concerning for ACS.  Low suspicion for PE or dissection. Chest x-ray without evidence suggestive of pneumonia, pneumothorax, pneumomediastinum.  No abnormal contour of the mediastinum to suggest dissection. No evidence of acute injuries. Discharged same day  Medications: Outpatient Encounter Medications as of 01/16/2021  Medication Sig  . ALPRAZolam (XANAX) 0.5 MG tablet Take 1 tablet (0.5 mg total) by mouth 3 (three) times daily.  . Aspirin-Acetaminophen (GOODYS BODY PAIN PO) Take 1 packet by mouth daily.  . clopidogrel (PLAVIX) 75 MG tablet TAKE ONE TABLET BY MOUTH EVERY MORNING  . diclofenac Sodium (VOLTAREN) 1 % GEL   . Dulaglutide (TRULICITY) 3 JI/9.6VE SOPN Inject 3 mg as directed once a week.  . fenofibrate 160 MG tablet TAKE ONE TABLET BY MOUTH EVERY MORNING  . fluticasone (FLONASE) 50 MCG/ACT nasal spray PLACE ONE SPRAY INTO BOTH NOSTRILS DAILY  . Fluticasone-Salmeterol,sensor, (AIRDUO DIGIHALER) 113-14 MCG/ACT AEPB Inhale 1 puff into the lungs in the morning and at bedtime.  [name] glucose blood (ONETOUCH VERIO) test strip USE DAILY TO CHECK YOUR BLOOD SUGAR (E11.69)  . lamoTRIgine (LAMICTAL) 100 MG tablet TAKE ONE TABLET BY MOUTH EVERYDAY AT BEDTIME  . lansoprazole (PREVACID) 30 MG capsule Take 1 capsule (30 mg total) by mouth every morning.  [name] lisinopril-hydrochlorothiazide (ZESTORETIC) 10-12.5 MG tablet TAKE ONE TABLET BY MOUTH EVERY MORNING  .  nitroGLYCERIN (NITROSTAT) 0.4 MG SL tablet Place 1 tablet (0.4 mg total) under the tongue every 5 (five) minutes as needed.  Sonya Small VERIO test strip USE DAILY TO CHECK YOUR BLOOD SUGAR (E11.69) Test 1 x daily  . oxyCODONE-acetaminophen (PERCOCET) 10-325 MG tablet Take 1 tablet by mouth every 6 (six) hours as needed.  . rosuvastatin (CRESTOR) 20 MG tablet TAKE ONE TABLET BY MOUTH EVERYDAY AT BEDTIME  . tamsulosin (FLOMAX) 0.4 MG CAPS capsule Take 1 capsule (0.4 mg total) by mouth 2 (two) times daily.  . TRUEplus Lancets 30G MISC 1 each by Does not apply route 2 (two) times daily.  . valACYclovir (VALTREX) 1000 MG tablet TAKE TWO TABLETS BY MOUTH twice A DAY FOR ONE DAY as needed  . VASCEPA 1 g capsule TAKE TWO CAPSULES BY MOUTH EVERY MORNING and TAKE TWO TABLETS BY MOUTH EVERYDAY AT BEDTIME   No facility-administered encounter medications on file as of 01/16/2021.   Reviewed chart for medication changes ahead of medication coordination call.  No OVs, Consults, or hospital visits since last care coordination call/Pharmacist visit. (If appropriate, list visit date, provider name)  No medication changes indicated OR if recent visit, treatment plan here.  BP Readings from Last 3 Encounters:  12/19/20 (!) 152/84  11/25/20 134/76  10/27/20 124/84    Lab Results  Component Value Date   HGBA1C 6.2 (H) 11/25/2020     Patient obtains medications through Adherence Packaging  30 Days   Last adherence delivery included:  Clopidogrel 75mg - Breakfast Fenofibrate 160mg -breakfast Lamotrigine 100mg  --bedtime Lansoprazole 30mg - before breakfast Lisinopril HCTZ 10-12.5-breakfst Rosuvastatin 20mg -bedtime Trulicity pens  Vascepa 1G- 2 breakfast 2 evening meal   Patient declined (meds) last month due to PRN use/additional supply on hand. Flonase-recently got a fill Venlafaxine was discontinued by cardiology  Patient is due for next adherence delivery on: 01/27/21. Called patient and reviewed  medications and coordinated delivery.  This delivery to include:  Clopidogrel 75mg - Breakfast Fenofibrate 160mg -breakfast Lamotrigine 100mg  --bedtime Lansoprazole 30mg - before breakfast Lisinopril HCTZ 10-12.5-breakfst Rosuvastatin 20mg -bedtime Trulicity pens Vascepa 1G- 2 breakfast 2 evening meal   Patient declined the following medications (meds) due to (reason) Flonase-has plenty on hand  Patient needs refills for  Lamotrigine 100mg    Confirmed delivery date of 01/27/21 advised patient that pharmacy will contact them the morning of delivery.  Star Rating Drugs: Lisinopril/HCTZ   12/26/20    30days Rosuvastatin        4/4/2     30days Clopidogrel            12/26/20    [place], [place] Pharmacist Assistant [phone]

## 2021-01-17 DIAGNOSIS — M67472 Ganglion, left ankle and foot: Secondary | ICD-10-CM

## 2021-01-17 HISTORY — DX: Ganglion, left ankle and foot: M67.472

## 2021-01-20 ENCOUNTER — Telehealth: Payer: Self-pay

## 2021-01-20 NOTE — Progress Notes (Signed)
    Chronic Care Management Pharmacy Assistant   Name: Sonya Small  MRN: [id number] DOB: 1950/10/22   Reason for Encounter: Adherence   Recent office visits:  01/11/21-Cardiology, hold Plavix 7 days prior to procedure, she is having cyst on foot removed and Kidney stone on April 29th, 2022  12/19/20-Cardiology, CAD, stop Venlafaxine, patient was advised to stop [employer] and take ASA 81mg  one per day  Recent consult visits:  01/17/21-Podiatry-cyst on left foot, patient wants to proceed with surgery, they will call to schedule.   Hospital visits:  10/19/20- ED Midline thoracic back pain, Work upnot concerning for ACS.Low suspicion for PE or dissection. Chest x-ray without evidence suggestive of pneumonia, pneumothorax, pneumomediastinum. No abnormal contour of the mediastinum to suggest dissection. No evidence of acute injuries. Discharged same day  Medications: Outpatient Encounter Medications as of 01/20/2021  Medication Sig  . ALPRAZolam (XANAX) 0.5 MG tablet Take 1 tablet (0.5 mg total) by mouth 3 (three) times daily.  . Aspirin-Acetaminophen (GOODYS BODY PAIN PO) Take 1 packet by mouth daily.  . clopidogrel (PLAVIX) 75 MG tablet TAKE ONE TABLET BY MOUTH EVERY MORNING  . diclofenac Sodium (VOLTAREN) 1 % GEL   . Dulaglutide (TRULICITY) 3 IR/4.4RX SOPN Inject 3 mg as directed once a week.  . fenofibrate 160 MG tablet TAKE ONE TABLET BY MOUTH EVERY MORNING  . fluticasone (FLONASE) 50 MCG/ACT nasal spray PLACE ONE SPRAY INTO BOTH NOSTRILS DAILY  . Fluticasone-Salmeterol,sensor, (AIRDUO DIGIHALER) 113-14 MCG/ACT AEPB Inhale 1 puff into the lungs in the morning and at bedtime.  [name] glucose blood (ONETOUCH VERIO) test strip USE DAILY TO CHECK YOUR BLOOD SUGAR (E11.69)  . lamoTRIgine (LAMICTAL) 100 MG tablet TAKE ONE TABLET BY MOUTH EVERYDAY AT BEDTIME  . lansoprazole (PREVACID) 30 MG capsule Take 1 capsule (30 mg total) by mouth every morning.  [name] lisinopril-hydrochlorothiazide  (ZESTORETIC) 10-12.5 MG tablet TAKE ONE TABLET BY MOUTH EVERY MORNING  . nitroGLYCERIN (NITROSTAT) 0.4 MG SL tablet Place 1 tablet (0.4 mg total) under the tongue every 5 (five) minutes as needed.  [name] VERIO test strip USE DAILY TO CHECK YOUR BLOOD SUGAR (E11.69) Test 1 x daily  . oxyCODONE-acetaminophen (PERCOCET) 10-325 MG tablet Take 1 tablet by mouth every 6 (six) hours as needed.  . rosuvastatin (CRESTOR) 20 MG tablet TAKE ONE TABLET BY MOUTH EVERYDAY AT BEDTIME  . tamsulosin (FLOMAX) 0.4 MG CAPS capsule Take 1 capsule (0.4 mg total) by mouth 2 (two) times daily.  . TRUEplus Lancets 30G MISC 1 each by Does not apply route 2 (two) times daily.  . valACYclovir (VALTREX) 1000 MG tablet TAKE TWO TABLETS BY MOUTH twice A DAY FOR ONE DAY as needed  . VASCEPA 1 g capsule TAKE TWO CAPSULES BY MOUTH EVERY MORNING and TAKE TWO TABLETS BY MOUTH EVERYDAY AT BEDTIME   No facility-administered encounter medications on file as of 01/20/2021.   Chart Review  Star Rating Drugs: Lisinopril/HCTZ   12/26/20    30days Rosuvastatin        4/4/2     30days Clopidogrel            12/26/20    30days  Patient next appointment scheduled for  02/27/21  Dr. [name] PCP  Clarita Leber, [place] Pharmacist Assistant [phone]

## 2021-01-22 HISTORY — PX: CYST EXCISION: SHX5701

## 2021-02-01 ENCOUNTER — Other Ambulatory Visit: Payer: Self-pay | Admitting: Family Medicine

## 2021-02-14 ENCOUNTER — Telehealth: Payer: Self-pay

## 2021-02-14 NOTE — Progress Notes (Signed)
Chronic Care Management Pharmacy Assistant   Name: Sonya Small  MRN: [id number] DOB: 06-04-1951   Reason for Encounter: Medication coordination for [place]   Recent office visits:  01/11/21-Cardiology, hold Plavix 7 days prior to procedure, she is having cyst on foot removed and Kidney stone on April 29th, 2022  12/19/20-Cardiology, CAD, stop Venlafaxine, patient was advised to stop [employer] and take ASA 81mg  one per day  Recent consult visits:  01/17/21-Podiatry-cyst on left foot, patient wants to proceed with surgery, they will call to schedule.   Hospital visits:  10/19/20- ED Midline thoracic back pain,Work upnot concerning for ACS.Low suspicion for PE or dissection. Chest x-ray without evidence suggestive of pneumonia, pneumothorax, pneumomediastinum. No abnormal contour of the mediastinum to suggest dissection. No evidence of acute injuries.Discharged same day  Medications: Outpatient Encounter Medications as of 02/14/2021  Medication Sig  . ALPRAZolam (XANAX) 0.5 MG tablet Take 1 tablet (0.5 mg total) by mouth 3 (three) times daily.  . Aspirin-Acetaminophen (GOODYS BODY PAIN PO) Take 1 packet by mouth daily.  . clopidogrel (PLAVIX) 75 MG tablet TAKE ONE TABLET BY MOUTH EVERY MORNING  . diclofenac Sodium (VOLTAREN) 1 % GEL   . Dulaglutide (TRULICITY) 3 EN/2.7PO SOPN Inject 3 mg as directed once a week.  . fenofibrate 160 MG tablet TAKE ONE TABLET BY MOUTH EVERY MORNING  . fluticasone (FLONASE) 50 MCG/ACT nasal spray INSTILL 1 SPRAY IN EACH NOSTRIL ONCE DAILY  . Fluticasone-Salmeterol,sensor, (AIRDUO DIGIHALER) 113-14 MCG/ACT AEPB Inhale 1 puff into the lungs in the morning and at bedtime.  [name] glucose blood (ONETOUCH VERIO) test strip USE DAILY TO CHECK YOUR BLOOD SUGAR (E11.69)  . lamoTRIgine (LAMICTAL) 100 MG tablet TAKE ONE TABLET BY MOUTH EVERYDAY AT BEDTIME  . lansoprazole (PREVACID) 30 MG capsule Take 1 capsule (30 mg total) by mouth every morning.  [name]  lisinopril-hydrochlorothiazide (ZESTORETIC) 10-12.5 MG tablet TAKE ONE TABLET BY MOUTH EVERY MORNING  . nitroGLYCERIN (NITROSTAT) 0.4 MG SL tablet Place 1 tablet (0.4 mg total) under the tongue every 5 (five) minutes as needed.  [name] VERIO test strip USE DAILY TO CHECK YOUR BLOOD SUGAR (E11.69) Test 1 x daily  . oxyCODONE-acetaminophen (PERCOCET) 10-325 MG tablet Take 1 tablet by mouth every 6 (six) hours as needed.  . rosuvastatin (CRESTOR) 20 MG tablet TAKE ONE TABLET BY MOUTH EVERYDAY AT BEDTIME  . tamsulosin (FLOMAX) 0.4 MG CAPS capsule Take 1 capsule (0.4 mg total) by mouth 2 (two) times daily.  . TRUEplus Lancets 30G MISC 1 each by Does not apply route 2 (two) times daily.  . valACYclovir (VALTREX) 1000 MG tablet TAKE TWO TABLETS BY MOUTH twice A DAY FOR ONE DAY as needed  . VASCEPA 1 g capsule TAKE TWO CAPSULES BY MOUTH EVERY MORNING and TAKE TWO TABLETS BY MOUTH EVERYDAY AT BEDTIME   No facility-administered encounter medications on file as of 02/14/2021.    Reviewed chart for medication changes ahead of medication coordination call.  No OVs, Consults, or hospital visits since last care coordination call/Pharmacist visit. (If appropriate, list visit date, provider name)  No medication changes indicated OR if recent visit, treatment plan here.  BP Readings from Last 3 Encounters:  12/19/20 (!) 152/84  11/25/20 134/76  10/27/20 124/84    Lab Results  Component Value Date   HGBA1C 6.2 (H) 11/25/2020     Patient obtains medications through Adherence Packaging  30 Days   Last adherence delivery included:  Clopidogrel 75mg - Breakfast Fenofibrate 160mg -breakfast Lamotrigine 100mg  --  bedtime Lansoprazole 30mg - before breakfast Lisinopril HCTZ 10-12.5-breakfst Rosuvastatin 20mg -bedtime Trulicity pens Vascepa 1G- 2 breakfast 2 evening meal  Patient declined (meds) last month due to PRN use/additional supply on hand. Flonase   Patient is due for next adherence delivery  on: 02/27/21 Called patient and reviewed medications and coordinated delivery.  This delivery to include: Clopidogrel 75mg - Breakfast Fenofibrate 160mg -breakfast Lamotrigine 100mg  --bedtime Lansoprazole 30mg - before breakfast Lisinopril HCTZ 10-12.5-breakfst Rosuvastatin 20mg -bedtime Trulicity pens Vascepa 1G- 2 breakfast 2 evening meal Flonase-PRN  Patient declined the following medications (meds) due to (reason) none  Patient needs refills for :none  Confirmed delivery date of 02/27/21, advised patient that pharmacy will contact them the morning of delivery.  Clarita Leber, [place] Pharmacist Assistant [phone]

## 2021-02-16 ENCOUNTER — Other Ambulatory Visit: Payer: Self-pay

## 2021-02-16 DIAGNOSIS — R5383 Other fatigue: Secondary | ICD-10-CM

## 2021-02-16 MED ORDER — ALPRAZOLAM 0.5 MG PO TABS: 0.5000 mg | ORAL_TABLET | Freq: Three times a day (TID) | ORAL | 0 refills | Status: DC

## 2021-02-16 NOTE — Telephone Encounter (Signed)
Pt called for refill request. States she would like [place] to put an emergency on it. Let pt know I would make dr aware.   Harrell Lark 02/16/21 2:37 PM

## 2021-02-27 ENCOUNTER — Encounter: Payer: Self-pay | Admitting: Family Medicine

## 2021-02-27 ENCOUNTER — Ambulatory Visit (INDEPENDENT_AMBULATORY_CARE_PROVIDER_SITE_OTHER): Payer: Medicare Other | Admitting: Family Medicine

## 2021-02-27 ENCOUNTER — Other Ambulatory Visit: Payer: Self-pay

## 2021-02-27 VITALS — BP 124/78 | HR 100 | Temp 97.0°F | Resp 18 | Ht 65.0 in | Wt 161.4 lb

## 2021-02-27 DIAGNOSIS — F331 Major depressive disorder, recurrent, moderate: Secondary | ICD-10-CM

## 2021-02-27 DIAGNOSIS — I119 Hypertensive heart disease without heart failure: Secondary | ICD-10-CM | POA: Diagnosis not present

## 2021-02-27 DIAGNOSIS — E782 Mixed hyperlipidemia: Secondary | ICD-10-CM

## 2021-02-27 DIAGNOSIS — E1142 Type 2 diabetes mellitus with diabetic polyneuropathy: Secondary | ICD-10-CM

## 2021-02-27 DIAGNOSIS — R0602 Shortness of breath: Secondary | ICD-10-CM

## 2021-02-27 DIAGNOSIS — J449 Chronic obstructive pulmonary disease, unspecified: Secondary | ICD-10-CM

## 2021-02-27 LAB — POCT UA - MICROALBUMIN: Microalbumin Ur, POC: NEGATIVE mg/L

## 2021-02-27 MED ORDER — LISINOPRIL-HYDROCHLOROTHIAZIDE 20-25 MG PO TABS: 1.0000 | ORAL_TABLET | Freq: Every day | ORAL | 3 refills | Status: DC

## 2021-02-27 NOTE — Progress Notes (Signed)
Subjective:  Patient ID: Sonya Small, female    DOB: April 11, 1951  Age: 70 y.o. MRN: [id number]  Chief Complaint  Patient presents with  . Hyperlipidemia  . Depression    HPI Essential hypertension with CAD On aspirin, plavix, lisinopril-hctz 10/12.5 mg one daily.  111-142/71-95. Pulse 80s.   Diabetic polyneuropathy associated with type 2 diabetes mellitus (HCC) Sugars fasting 130s-140s. Trulicity 3 mg weekly. Pt is trying hard to limit portions and eat healthy.   Mixed hyperlipidemia On vascepa, fenofibrate, and crestor.  Depression, major, recurrent, moderate ([place]) Patient is unsure if she is taking lamictal.   Current Outpatient Medications on File Prior to Visit  Medication Sig Dispense Refill  . tiZANidine (ZANAFLEX) 4 MG tablet 4 mg as needed. Take 1/2 tablet as needed    . ALPRAZolam (XANAX) 0.5 MG tablet Take 1 tablet (0.5 mg total) by mouth 3 (three) times daily. 90 tablet 0  . Aspirin-Acetaminophen (GOODYS BODY PAIN PO) Take 1 packet by mouth daily.    . clopidogrel (PLAVIX) 75 MG tablet TAKE ONE TABLET BY MOUTH EVERY MORNING 90 tablet 2  . diclofenac Sodium (VOLTAREN) 1 % GEL     . Dulaglutide (TRULICITY) 3 GL/8.7FI SOPN Inject 3 mg as directed once a week. 4 mL 2  . fenofibrate 160 MG tablet TAKE ONE TABLET BY MOUTH EVERY MORNING 90 tablet 2  . fluticasone (FLONASE) 50 MCG/ACT nasal spray INSTILL 1 SPRAY IN EACH NOSTRIL ONCE DAILY 16 g 1  . Fluticasone-Salmeterol,sensor, (AIRDUO DIGIHALER) 113-14 MCG/ACT AEPB Inhale 1 puff into the lungs in the morning and at bedtime.    [name] glucose blood (ONETOUCH VERIO) test strip USE DAILY TO CHECK YOUR BLOOD SUGAR (E11.69) 100 each 3  . lamoTRIgine (LAMICTAL) 100 MG tablet TAKE ONE TABLET BY MOUTH EVERYDAY AT BEDTIME 30 tablet 1  . lansoprazole (PREVACID) 30 MG capsule Take 1 capsule (30 mg total) by mouth every morning. 90 capsule 1  . nitroGLYCERIN (NITROSTAT) 0.4 MG SL tablet Place 1 tablet (0.4 mg total) under the tongue  every 5 (five) minutes as needed. 25 tablet 1  . ONETOUCH VERIO test strip USE DAILY TO CHECK YOUR BLOOD SUGAR (E11.69) Test 1 x daily 100 each 3  . oxyCODONE-acetaminophen (PERCOCET) 10-325 MG tablet Take 1 tablet by mouth every 6 (six) hours as needed.    . rosuvastatin (CRESTOR) 20 MG tablet TAKE ONE TABLET BY MOUTH EVERYDAY AT BEDTIME 90 tablet 2  . TRUEplus Lancets 30G MISC 1 each by Does not apply route 2 (two) times daily. 100 each 2  . valACYclovir (VALTREX) 1000 MG tablet TAKE TWO TABLETS BY MOUTH twice A DAY FOR ONE DAY as needed 10 tablet 2  . VASCEPA 1 g capsule TAKE TWO CAPSULES BY MOUTH EVERY MORNING and TAKE TWO TABLETS BY MOUTH EVERYDAY AT BEDTIME 360 capsule 2   No current facility-administered medications on file prior to visit.   Past Medical History:  Diagnosis Date  . Anxiety   . CAD (coronary artery disease)    a. NSTEMI 11/2008 s/p DES to LCx (3.0x12 Xience); b. NSTEMI 01/2010 secondary to thrombotic RCA lesion (non-obstructive)-->med rx (integrilin x 24 hrs + plavix); c. 09/2012 negative Myoview.  . Chronic diastolic CHF (congestive heart failure) ([place])    a. 06/2014 Echo: EF 55-60%, no rwma, Gr1 DD, mild AI.  [name] Depression   . Dizziness   . Drug induced constipation   . Generalized hyperhidrosis   . GERD (gastroesophageal reflux disease)   .  Headache   . Hyperlipidemia   . Hypertensive heart disease   . Lumbar disc disease   . Metabolic encephalopathy   . Mixed hyperlipidemia   . Myocardial infarction ([place])   . Obstructive sleep apnea   . OP (osteoporosis)   . Osteoarthritis   . Osteoporosis   . Other malaise   . Overweight(278.02)   . PAD (peripheral artery disease) ([place])    a. Emboli to R foot 2010 from partially occlusive lesion in R EIA, s/p stenting. - followed by Dr. Donnetta Hutching;  b. 10/2015 ABIs: R 1.03, L 0.97.  [name] Restless leg   . Sleep apnea   . Stroke ([place])   . TIA (transient ischemic attack)   . Tobacco abuse   . Urge incontinence    Past Surgical  History:  Procedure Laterality Date  . CYST EXCISION Left 01/2021   left foot  . EYE SURGERY     at age 54  . hysterectomy -age 89    . ILIAC ARTERY STENT     RIGHT ILIAC STENT  . KNEE ARTHROSCOPY    . LITHOTRIPSY Left 12/2020  . LUMBAR LAMINECTOMY    . TUBAL LIGATION      Family History  Problem Relation Age of Onset  . Alzheimer's disease Mother   . Hodgkin's lymphoma Brother   . Lung cancer Brother   . Hepatitis C Brother   . Hypothyroidism Brother   . Hypertension Brother   . Depression Brother   . Heart disease Other        Grandfather   Social History   Socioeconomic History  . Marital status: Divorced    Spouse name: Not on file  . Number of children: 3  . Years of education: 10 th  . Highest education level: Not on file  Occupational History  . Occupation: DISABLED    Employer: UNEMPLOYED  Tobacco Use  . Smoking status: Current Every Day Smoker    Packs/day: 0.50    Years: 54.00    Pack years: 27.00    Types: Cigarettes    Last attempt to quit: 11/20/2012    Years since quitting: 8.2  . Smokeless tobacco: Never Used  . Tobacco comment: 2 ppd for many years.   Substance and Sexual Activity  . Alcohol use: No    Alcohol/week: 0.0 standard drinks  . Drug use: No  . Sexual activity: Never  Other Topics Concern  . Not on file  Social History Narrative   Patient is single with 3 children, 1 deceased.   Patient is right handed.   Patient has 10 th grade education.   Patient drinks 5 or more cups daily.   Social Determinants of Health   Financial Resource Strain: Not on file  Food Insecurity: No Food Insecurity  . Worried About Charity fundraiser in the Last Year: Never true  . Ran Out of Food in the Last Year: Never true  Transportation Needs: Not on file  Physical Activity: Not on file  Stress: Not on file  Social Connections: Not on file    Review of Systems  Constitutional: Positive for unexpected weight change (losing). Negative for  chills, fatigue and fever.  HENT: Negative for congestion, ear pain, rhinorrhea and sore throat.        Extreme dry mouth   Respiratory: Positive for cough and shortness of breath.   Cardiovascular: Positive for chest pain (maybe once a month. associated with irritability.).  Gastrointestinal: Negative for abdominal pain, constipation,  diarrhea, nausea and vomiting.  Endocrine: Positive for polydipsia and polyuria. Negative for polyphagia.  Genitourinary: Negative for dysuria and urgency.  Musculoskeletal: Positive for arthralgias and back pain (spasms. sees a pain clinic). Negative for myalgias.  Neurological: Positive for headaches (sporadically.). Negative for dizziness, weakness and light-headedness.  Psychiatric/Behavioral: Positive for dysphoric mood. The patient is nervous/anxious.      Objective:  BP 124/78   Pulse 100   Temp (!) 97 F (36.1 C)   Resp 18   Ht 5\' 5"  (1.651 m)   Wt 161 lb 6.4 oz (73.2 kg)   BMI 26.86 kg/m   BP/Weight 02/27/2021 05/28/91 11/25/74  Systolic BP 226 333 545  Diastolic BP 78 84 76  Wt. (Lbs) 161.4 169 165  BMI 26.86 28.12 27.46    Physical Exam Vitals reviewed.  Constitutional:      Appearance: Normal appearance. She is normal weight.  Neck:     Vascular: No carotid bruit.  Cardiovascular:     Rate and Rhythm: Normal rate and regular rhythm.     Pulses: Normal pulses.     Heart sounds: Normal heart sounds.  Pulmonary:     Effort: Pulmonary effort is normal. No respiratory distress.     Breath sounds: Normal breath sounds.  Abdominal:     General: Abdomen is flat. Bowel sounds are normal.     Palpations: Abdomen is soft.     Tenderness: There is no abdominal tenderness.  Neurological:     Mental Status: She is alert and oriented to person, place, and time.  Psychiatric:        Mood and Affect: Mood normal.        Behavior: Behavior normal.     Diabetic Foot Exam - Simple   No data filed      Lab Results  Component Value  Date   WBC 9.1 11/25/2020   HGB 14.4 11/25/2020   HCT 44.4 11/25/2020   PLT 250 11/25/2020   GLUCOSE 133 (H) 11/25/2020   CHOL 122 11/25/2020   TRIG 92 11/25/2020   HDL 50 11/25/2020   LDLCALC 54 11/25/2020   ALT 15 11/25/2020   AST 16 11/25/2020   NA 144 11/25/2020   K 4.3 11/25/2020   CL 104 11/25/2020   CREATININE 0.79 11/25/2020   BUN 20 11/25/2020   CO2 23 11/25/2020   TSH 0.619 12/04/2019   INR 1.0 01/31/2019   HGBA1C 6.2 (H) 11/25/2020   MICROALBUR neg 02/27/2021      Assessment & Plan:   1. Essential hypertension with heart disease Well controlled.  No changes to medicines.  Continue to work on eating a healthy diet and exercise.  Labs drawn today.  - A1C - Comprehensive metabolic panel - POCT UA - Microalbumin - lisinopril-hydrochlorothiazide (ZESTORETIC) 20-25 MG tablet; Take 1 tablet by mouth daily.  Dispense: 30 tablet; Refill: 3  2. Diabetic polyneuropathy associated with type 2 diabetes mellitus ([place]) Control: well controlled. Recommend check sugars fasting daily. Recommend check feet daily. Recommend annual eye exams. Medicines: no changes except change lisinopril/hctz 20/25 mg once daily Continue to work on eating a healthy diet and exercise.  Labs drawn today.    3. Mixed hyperlipidemia Well controlled.  No changes to medicines.  Continue to work on eating a healthy diet and exercise.  Labs drawn today.  - Lipid panel  4. Depression, major, recurrent, moderate ([place]) The current medical regimen is effective;  continue present plan and medications. Refuses counseling.  5. Copd/DYSPNEA Order ct of chest without contrast.  The current medical regimen is effective;  continue present plan and medications. Keep follow up with pulmonology.   6. Weight loss: monitor at home.   Meds ordered this encounter  Medications  . lisinopril-hydrochlorothiazide (ZESTORETIC) 20-25 MG tablet    Sig: Take 1 tablet by mouth daily.    Dispense:  30  tablet    Refill:  3    Orders Placed This Encounter  Procedures  . CT Chest Wo Contrast  . CBC with Differential/Platelet  . Comprehensive metabolic panel  . Lipid panel  . Hemoglobin A1c  . POCT UA - Microalbumin     Follow-up: Return in about 3 months (around 05/30/2021) for fasting. needs a cmp in 1 month due to change in medicines today. [name]  An After Visit Summary was printed and given to the patient.  [name], MD [name] [phone]

## 2021-02-27 NOTE — Patient Instructions (Addendum)
Check if taking  1 Lamotrigine (Lamictal) 2. Tamsulosin (flomax)  Change lisinopril/hctz to 20/25 mg once daily due to spilling protein in urine.

## 2021-02-28 ENCOUNTER — Other Ambulatory Visit: Payer: Self-pay

## 2021-02-28 LAB — LIPID PANEL
Chol/HDL Ratio: 3 ratio (ref 0.0–4.4)
Cholesterol, Total: 129 mg/dL (ref 100–199)
HDL: 43 mg/dL (ref >39)
LDL Chol Calc (NIH): 65 mg/dL (ref 0–99)
Triglycerides: 116 mg/dL (ref 0–149)
VLDL Cholesterol Cal: 21 mg/dL (ref 5–40)

## 2021-02-28 LAB — CBC WITH DIFFERENTIAL/PLATELET
Basophils Absolute: 0.1 10*3/uL (ref 0.0–0.2)
Basos: 1 %
EOS (ABSOLUTE): 0.1 10*3/uL (ref 0.0–0.4)
Eos: 1 %
Hematocrit: 46.4 % (ref 34.0–46.6)
Hemoglobin: 15 g/dL (ref 11.1–15.9)
Immature Grans (Abs): 0.1 10*3/uL (ref 0.0–0.1)
Immature Granulocytes: 1 %
Lymphocytes Absolute: 2.6 10*3/uL (ref 0.7–3.1)
Lymphs: 23 %
MCH: 27.7 pg (ref 26.6–33.0)
MCHC: 32.3 g/dL (ref 31.5–35.7)
MCV: 86 fL (ref 79–97)
Monocytes Absolute: 0.9 10*3/uL (ref 0.1–0.9)
Monocytes: 8 %
Neutrophils Absolute: 7.2 10*3/uL — ABNORMAL HIGH (ref 1.4–7.0)
Neutrophils: 66 %
Platelets: 302 10*3/uL (ref 150–450)
RBC: 5.41 x10E6/uL — ABNORMAL HIGH (ref 3.77–5.28)
RDW: 12.9 % (ref 11.7–15.4)
WBC: 10.9 10*3/uL — ABNORMAL HIGH (ref 3.4–10.8)

## 2021-02-28 LAB — COMPREHENSIVE METABOLIC PANEL
ALT: 15 IU/L (ref 0–32)
AST: 20 IU/L (ref 0–40)
Albumin/Globulin Ratio: 1.8 (ref 1.2–2.2)
Albumin: 4.4 g/dL (ref 3.7–4.7)
Alkaline Phosphatase: 82 IU/L (ref 44–121)
BUN/Creatinine Ratio: 18 (ref 12–28)
BUN: 14 mg/dL (ref 8–27)
Bilirubin Total: 0.2 mg/dL (ref 0.0–1.2)
CO2: 25 mmol/L (ref 20–29)
Calcium: 9.8 mg/dL (ref 8.7–10.3)
Chloride: 104 mmol/L (ref 96–106)
Creatinine, Ser: 0.77 mg/dL (ref 0.57–1.00)
Globulin, Total: 2.5 g/dL (ref 1.5–4.5)
Glucose: 108 mg/dL — ABNORMAL HIGH (ref 65–99)
Potassium: 4.6 mmol/L (ref 3.5–5.2)
Sodium: 143 mmol/L (ref 134–144)
Total Protein: 6.9 g/dL (ref 6.0–8.5)
eGFR: 82 mL/min/{1.73_m2} (ref >59)

## 2021-02-28 LAB — HEMOGLOBIN A1C
Est. average glucose Bld gHb Est-mCnc: 137 mg/dL
Hgb A1c MFr Bld: 6.4 % — ABNORMAL HIGH (ref 4.8–5.6)

## 2021-02-28 LAB — CARDIOVASCULAR RISK ASSESSMENT

## 2021-03-13 ENCOUNTER — Encounter: Payer: Self-pay | Admitting: Physician Assistant

## 2021-03-13 ENCOUNTER — Ambulatory Visit (INDEPENDENT_AMBULATORY_CARE_PROVIDER_SITE_OTHER): Payer: Medicare Other | Admitting: Physician Assistant

## 2021-03-13 ENCOUNTER — Other Ambulatory Visit: Payer: Self-pay

## 2021-03-13 VITALS — BP 130/72 | HR 94 | Temp 98.3°F | Ht 65.0 in | Wt 162.0 lb

## 2021-03-13 DIAGNOSIS — N3091 Cystitis, unspecified with hematuria: Secondary | ICD-10-CM | POA: Insufficient documentation

## 2021-03-13 LAB — POCT URINALYSIS DIP (CLINITEK)
Bilirubin, UA: NEGATIVE
Glucose, UA: NEGATIVE mg/dL
Ketones, POC UA: NEGATIVE mg/dL
Nitrite, UA: NEGATIVE
Spec Grav, UA: 1.025 (ref 1.010–1.025)
Urobilinogen, UA: 0.2 E.U./dL
pH, UA: 6 (ref 5.0–8.0)

## 2021-03-13 MED ORDER — NITROFURANTOIN MONOHYD MACRO 100 MG PO CAPS: 100.0000 mg | ORAL_CAPSULE | Freq: Two times a day (BID) | ORAL | 0 refills | Status: DC

## 2021-03-13 NOTE — Progress Notes (Signed)
Acute Office Visit  Subjective:    Patient ID: Sonya Small, female    DOB: 1951-07-05, 70 y.o.   MRN: [id number]  Chief Complaint  Patient presents with   Hematuria    HPI Patient is in today for dysuria, urgency that started a few days ago She denies abdominal or pelvic pain - no vaginal discharge Denies nausea/vomiting  Past Medical History:  Diagnosis Date   Anxiety    CAD (coronary artery disease)    a. NSTEMI 11/2008 s/p DES to LCx (3.0x12 Xience); b. NSTEMI 01/2010 secondary to thrombotic RCA lesion (non-obstructive)-->med rx (integrilin x 24 hrs + plavix); c. 09/2012 negative Myoview.   Chronic diastolic CHF (congestive heart failure) (HCC)    a. 06/2014 Echo: EF 55-60%, no rwma, Gr1 DD, mild AI.   Depression    Dizziness    Drug induced constipation    Generalized hyperhidrosis    GERD (gastroesophageal reflux disease)    Headache    Hyperlipidemia    Hypertensive heart disease    Lumbar disc disease    Metabolic encephalopathy    Mixed hyperlipidemia    Myocardial infarction (HCC)    Obstructive sleep apnea    OP (osteoporosis)    Osteoarthritis    Osteoporosis    Other malaise    Overweight(278.02)    PAD (peripheral artery disease) (HCC)    a. Emboli to R foot 2010 from partially occlusive lesion in R EIA, s/p stenting. - followed by Dr. Arbie Cookey;  b. 10/2015 ABIs: R 1.03, L 0.97.   Restless leg    Sleep apnea    Stroke [facility])    TIA (transient ischemic attack)    Tobacco abuse    Urge incontinence     Past Surgical History:  Procedure Laterality Date   CYST EXCISION Left 01/2021   left foot   EYE SURGERY     at age 72   hysterectomy -age 73     ILIAC ARTERY STENT     RIGHT ILIAC STENT   KNEE ARTHROSCOPY     LITHOTRIPSY Left 12/2020   LUMBAR LAMINECTOMY     TUBAL LIGATION      Family History  Problem Relation Age of Onset   Alzheimer's disease Mother    Hodgkin's lymphoma Brother    Lung cancer Brother    Hepatitis C Brother     Hypothyroidism Brother    Hypertension Brother    Depression Brother    Heart disease Other        Grandfather    Social History   Socioeconomic History   Marital status: Divorced    Spouse name: Not on file   Number of children: 3   Years of education: 10 th   Highest education level: Not on file  Occupational History   Occupation: DISABLED    Employer: UNEMPLOYED  Tobacco Use   Smoking status: Every Day    Packs/day: 0.50    Years: 54.00    Pack years: 27.00    Types: Cigarettes    Last attempt to quit: 11/20/2012    Years since quitting: 8.3   Smokeless tobacco: Never   Tobacco comments:    2 ppd for many years.   Substance and Sexual Activity   Alcohol use: No    Alcohol/week: 0.0 standard drinks   Drug use: No   Sexual activity: Never  Other Topics Concern   Not on file  Social History Narrative   Patient is single with 3 children,  1 deceased.   Patient is right handed.   Patient has 10 th grade education.   Patient drinks 5 or more cups daily.   Social Determinants of Radio broadcast assistant Strain: Not on file  Food Insecurity: No Food Insecurity   Worried About Charity fundraiser in the Last Year: Never true   Ran Out of Food in the Last Year: Never true  Transportation Needs: Not on file  Physical Activity: Not on file  Stress: Not on file  Social Connections: Not on file  Intimate Partner Violence: Not on file    Outpatient Medications Prior to Visit  Medication Sig Dispense Refill   ALPRAZolam (XANAX) 0.5 MG tablet Take 1 tablet (0.5 mg total) by mouth 3 (three) times daily. 90 tablet 0   Aspirin-Acetaminophen (GOODYS BODY PAIN PO) Take 1 packet by mouth daily.     clopidogrel (PLAVIX) 75 MG tablet TAKE ONE TABLET BY MOUTH EVERY MORNING 90 tablet 2   diclofenac Sodium (VOLTAREN) 1 % GEL      Dulaglutide (TRULICITY) 3 SV/7.7LT SOPN Inject 3 mg as directed once a week. 4 mL 2   fenofibrate 160 MG tablet TAKE ONE TABLET BY MOUTH EVERY MORNING  90 tablet 2   fluticasone (FLONASE) 50 MCG/ACT nasal spray INSTILL 1 SPRAY IN EACH NOSTRIL ONCE DAILY 16 g 1   Fluticasone-Salmeterol,sensor, (AIRDUO DIGIHALER) 113-14 MCG/ACT AEPB Inhale 1 puff into the lungs in the morning and at bedtime.     glucose blood (ONETOUCH VERIO) test strip USE DAILY TO CHECK YOUR BLOOD SUGAR (E11.69) 100 each 3   lansoprazole (PREVACID) 30 MG capsule Take 1 capsule (30 mg total) by mouth every morning. 90 capsule 1   lisinopril-hydrochlorothiazide (ZESTORETIC) 20-25 MG tablet Take 1 tablet by mouth daily. 30 tablet 3   nitroGLYCERIN (NITROSTAT) 0.4 MG SL tablet Place 1 tablet (0.4 mg total) under the tongue every 5 (five) minutes as needed. 25 tablet 1   ONETOUCH VERIO test strip USE DAILY TO CHECK YOUR BLOOD SUGAR (E11.69) Test 1 x daily 100 each 3   oxyCODONE-acetaminophen (PERCOCET) 10-325 MG tablet Take 1 tablet by mouth every 6 (six) hours as needed.     rosuvastatin (CRESTOR) 20 MG tablet TAKE ONE TABLET BY MOUTH EVERYDAY AT BEDTIME 90 tablet 2   tiZANidine (ZANAFLEX) 4 MG tablet 4 mg as needed. Take 1/2 tablet as needed     TRUEplus Lancets 30G MISC 1 each by Does not apply route 2 (two) times daily. 100 each 2   valACYclovir (VALTREX) 1000 MG tablet TAKE TWO TABLETS BY MOUTH twice A DAY FOR ONE DAY as needed 10 tablet 2   VASCEPA 1 g capsule TAKE TWO CAPSULES BY MOUTH EVERY MORNING and TAKE TWO TABLETS BY MOUTH EVERYDAY AT BEDTIME 360 capsule 2   No facility-administered medications prior to visit.    Allergies  Allergen Reactions   Abilify [Aripiprazole]     confusion   Latex Rash    Review of Systems CONSTITUTIONAL: Negative for chills, fatigue, fever, unintentional weight gain and unintentional weight loss.  CARDIOVASCULAR: Negative for chest pain, dizziness, palpitations and pedal edema.  RESPIRATORY: Negative for recent cough and dyspnea.  GASTROINTESTINAL: Negative for abdominal pain, acid reflux symptoms, constipation, diarrhea, nausea and  vomiting.  Gu - see HPI         Objective:    Physical Exam PHYSICAL EXAM:   VS: BP 130/72 (BP Location: Right Arm, Patient Position: Sitting, Cuff Size: Normal)  Pulse 94   Temp 98.3 F (36.8 C) (Temporal)   Ht 5\' 5"  (1.651 m)   Wt 162 lb (73.5 kg)   SpO2 98%   BMI 26.96 kg/m   GEN: Well nourished, well developed, in no acute distress   Cardiac: RRR; no murmurs, rubs, or gallops, Respiratory:  normal respiratory rate and pattern with no distress - normal breath sounds with no rales, rhonchi, wheezes or rubs GI: normal bowel sounds, no masses or tenderness  Psych: euthymic mood, appropriate affect and demeanor  BP 130/72 (BP Location: Right Arm, Patient Position: Sitting, Cuff Size: Normal)   Pulse 94   Temp 98.3 F (36.8 C) (Temporal)   Ht 5\' 5"  (1.651 m)   Wt 162 lb (73.5 kg)   SpO2 98%   BMI 26.96 kg/m  Wt Readings from Last 3 Encounters:  03/13/21 162 lb (73.5 kg)  02/27/21 161 lb 6.4 oz (73.2 kg)  12/19/20 169 lb (76.7 kg)    Health Maintenance Due  Topic Date Due   OPHTHALMOLOGY EXAM  Never done   Hepatitis C Screening  Never done   TETANUS/TDAP  Never done   COLONOSCOPY (Pts 45-62yrs Insurance coverage will need to be confirmed)  Never done   Zoster Vaccines- Shingrix (1 of 2) Never done   PNA vac Low Risk Adult (2 of 2 - PPSV23) 06/25/2016   COVID-19 Vaccine (2 - Moderna series) 08/29/2020    There are no preventive care reminders to display for this patient.   Lab Results  Component Value Date   TSH 0.619 12/04/2019   Lab Results  Component Value Date   WBC 10.9 (H) 02/27/2021   HGB 15.0 02/27/2021   HCT 46.4 02/27/2021   MCV 86 02/27/2021   PLT 302 02/27/2021   Lab Results  Component Value Date   NA 143 02/27/2021   K 4.6 02/27/2021   CO2 25 02/27/2021   GLUCOSE 108 (H) 02/27/2021   BUN 14 02/27/2021   CREATININE 0.77 02/27/2021   BILITOT 0.2 02/27/2021   ALKPHOS 82 02/27/2021   AST 20 02/27/2021   ALT 15 02/27/2021   PROT  6.9 02/27/2021   ALBUMIN 4.4 02/27/2021   CALCIUM 9.8 02/27/2021   ANIONGAP 10 10/19/2020   EGFR 82 02/27/2021   Lab Results  Component Value Date   CHOL 129 02/27/2021   Lab Results  Component Value Date   HDL 43 02/27/2021   Lab Results  Component Value Date   LDLCALC 65 02/27/2021   Lab Results  Component Value Date   TRIG 116 02/27/2021   Lab Results  Component Value Date   CHOLHDL 3.0 02/27/2021   Lab Results  Component Value Date   HGBA1C 6.4 (H) 02/27/2021       Assessment & Plan:  1. Cystitis with hematuria - POCT URINALYSIS DIP (CLINITEK) - Urine Culture - nitrofurantoin, macrocrystal-monohydrate, (MACROBID) 100 MG capsule; Take 1 capsule (100 mg total) by mouth 2 (two) times daily.  Dispense: 20 capsule; Refill: 0    Meds ordered this encounter  Medications   nitrofurantoin, macrocrystal-monohydrate, (MACROBID) 100 MG capsule    Sig: Take 1 capsule (100 mg total) by mouth 2 (two) times daily.    Dispense:  20 capsule    Refill:  0    Order Specific Question:   Supervising Provider    Answer08/02/2021    Orders Placed This Encounter  Procedures   Urine Culture   POCT URINALYSIS DIP (CLINITEK)  Follow-up: Return if symptoms worsen or fail to improve.  An After Visit Summary was printed and given to the patient.  Yetta Flock [name] [phone]

## 2021-03-15 ENCOUNTER — Other Ambulatory Visit: Payer: Self-pay | Admitting: Family Medicine

## 2021-03-15 LAB — URINE CULTURE

## 2021-03-16 ENCOUNTER — Telehealth: Payer: Self-pay

## 2021-03-16 NOTE — Progress Notes (Signed)
Chronic Care Management Pharmacy Assistant   Name: Sonya Small  MRN: [id number] DOB: 11-Mar-1951   Reason for Encounter: Medication coordination for Upstream   Recent office visits:  03/13/21-Sonya Rosana Hoes PA-C-cystitis with hematuria, start nitrofurantoin, macrocrystal-monohydrate, (MACROBID) 100 MG capsule twice a day for 10days, follow up PRN   Recent consult visits:  02/28/21-Sonya Small DPM, podiatry, aftercare following surgery of ganglion of left foot, follow up one month  Hospital visits:  02/15/21-Sonya Small, Sonya Small DPM, excision of ganglion of left foot   Medications: Outpatient Encounter Medications as of 03/16/2021  Medication Sig   ALPRAZolam (XANAX) 0.5 MG tablet Take 1 tablet (0.5 mg total) by mouth 3 (three) times daily.   Aspirin-Acetaminophen (GOODYS BODY PAIN PO) Take 1 packet by mouth daily.   clopidogrel (PLAVIX) 75 MG tablet TAKE ONE TABLET BY MOUTH EVERY MORNING   diclofenac Sodium (VOLTAREN) 1 % GEL    fenofibrate 160 MG tablet TAKE ONE TABLET BY MOUTH EVERY MORNING   fluticasone (FLONASE) 50 MCG/ACT nasal spray INSTILL 1 SPRAY IN EACH NOSTRIL ONCE DAILY   Fluticasone-Salmeterol,sensor, (AIRDUO DIGIHALER) 113-14 MCG/ACT AEPB Inhale 1 puff into the lungs in the morning and at bedtime.   glucose blood (ONETOUCH VERIO) test strip USE DAILY TO CHECK YOUR BLOOD SUGAR (E11.69)   lansoprazole (PREVACID) 30 MG capsule TAKE ONE CAPSULE BY MOUTH BEFORE BREAKFAST   lisinopril-hydrochlorothiazide (ZESTORETIC) 20-25 MG tablet Take 1 tablet by mouth daily.   nitrofurantoin, macrocrystal-monohydrate, (MACROBID) 100 MG capsule Take 1 capsule (100 mg total) by mouth 2 (two) times daily.   nitroGLYCERIN (NITROSTAT) 0.4 MG SL tablet Place 1 tablet (0.4 mg total) under the tongue every 5 (five) minutes as needed.   ONETOUCH VERIO test strip USE DAILY TO CHECK YOUR BLOOD SUGAR (E11.69) Test 1 x daily   oxyCODONE-acetaminophen (PERCOCET) 10-325 MG tablet Take 1  tablet by mouth every 6 (six) hours as needed.   rosuvastatin (CRESTOR) 20 MG tablet TAKE ONE TABLET BY MOUTH EVERYDAY AT BEDTIME   tiZANidine (ZANAFLEX) 4 MG tablet 4 mg as needed. Take 1/2 tablet as needed   TRUEplus Lancets 30G MISC 1 each by Does not apply route 2 (two) times daily.   TRULICITY 3 RJ/1.8AC SOPN Inject 3 mg as directed once a week.   valACYclovir (VALTREX) 1000 MG tablet TAKE TWO TABLETS BY MOUTH twice A DAY FOR ONE DAY as needed   VASCEPA 1 g capsule TAKE TWO CAPSULES BY MOUTH EVERY MORNING and TAKE TWO TABLETS BY MOUTH EVERYDAY AT BEDTIME   No facility-administered encounter medications on file as of 03/16/2021.   Reviewed chart for medication changes ahead of medication coordination call.  No OVs, Consults, or hospital visits since last care coordination call/Pharmacist visit. (If appropriate, list visit date, provider name)  No medication changes indicated OR if recent visit, treatment plan here.  BP Readings from Last 3 Encounters:  03/13/21 130/72  02/27/21 124/78  12/19/20 (!) 152/84    Lab Results  Component Value Date   HGBA1C 6.4 (H) 02/27/2021     Patient obtains medications through Adherence Packaging  30 Days   Last adherence delivery included:  Clopidogrel 75mg - Breakfast Fenofibrate 160mg -breakfast Lamotrigine 100mg  --bedtime Lansoprazole 30mg - before breakfast Lisinopril HCTZ 10-12.5-breakfst Rosuvastatin 20mg -bedtime Trulicity pens Vascepa 1G- 2 breakfast  2 evening meal  Flonase-PRN  Patient declined (meds) last month due to PRN use/additional supply on hand. None  Patient is due for next adherence delivery on: 03/29/21. Called patient and reviewed medications and  coordinated delivery.  This delivery to include: Clopidogrel 75mg - Breakfast Fenofibrate 160mg -breakfast Lamotrigine 100mg  --bedtime Lansoprazole 30mg - before breakfast Lisinopril HCTZ 20-25-breakfast Rosuvastatin 20mg -bedtime Trulicity pens Vascepa 1G- 2 breakfast  2  evening meal   Patient declined the following medications (meds) due to (reason) Flonase-PRN  has some on hand, will call if needed   Patient needs refills for  None.  Confirmed delivery date of 03/29/21, advised patient that pharmacy will contact them the morning of delivery.  Sonya Small, [place] Pharmacist Assistant [phone]

## 2021-03-29 ENCOUNTER — Other Ambulatory Visit: Payer: Self-pay

## 2021-03-29 ENCOUNTER — Ambulatory Visit: Payer: Medicare Other

## 2021-03-29 DIAGNOSIS — I119 Hypertensive heart disease without heart failure: Secondary | ICD-10-CM

## 2021-03-29 DIAGNOSIS — R5383 Other fatigue: Secondary | ICD-10-CM

## 2021-03-29 MED ORDER — ALPRAZOLAM 0.5 MG PO TABS: 0.5000 mg | ORAL_TABLET | Freq: Three times a day (TID) | ORAL | 0 refills | Status: DC

## 2021-03-30 LAB — COMPREHENSIVE METABOLIC PANEL
ALT: 15 IU/L (ref 0–32)
AST: 17 IU/L (ref 0–40)
Albumin/Globulin Ratio: 2.1 (ref 1.2–2.2)
Albumin: 4.4 g/dL (ref 3.7–4.7)
Alkaline Phosphatase: 80 IU/L (ref 44–121)
BUN/Creatinine Ratio: 28 (ref 12–28)
BUN: 18 mg/dL (ref 8–27)
Bilirubin Total: <0.2 mg/dL (ref 0.0–1.2)
CO2: 24 mmol/L (ref 20–29)
Calcium: 9.4 mg/dL (ref 8.7–10.3)
Chloride: 105 mmol/L (ref 96–106)
Creatinine, Ser: 0.65 mg/dL (ref 0.57–1.00)
Globulin, Total: 2.1 g/dL (ref 1.5–4.5)
Glucose: 144 mg/dL — ABNORMAL HIGH (ref 65–99)
Potassium: 4 mmol/L (ref 3.5–5.2)
Sodium: 145 mmol/L — ABNORMAL HIGH (ref 134–144)
Total Protein: 6.5 g/dL (ref 6.0–8.5)
eGFR: 94 mL/min/{1.73_m2} (ref >59)

## 2021-04-11 ENCOUNTER — Other Ambulatory Visit: Payer: Self-pay

## 2021-04-11 MED ORDER — ONETOUCH VERIO VI STRP: ORAL_STRIP | 3 refills | Status: DC

## 2021-04-19 ENCOUNTER — Telehealth: Payer: Self-pay

## 2021-04-19 NOTE — Progress Notes (Signed)
Chronic Care Management Pharmacy Assistant   Name: Sonya Small  MRN: [id number] DOB: December 06, 1950  Reason for Encounter: Medication coordination for Upstream   Recent office visits:  03/13/21-Sara Rosana Hoes PA-C-cystitis with hematuria, start nitrofurantoin, macrocrystal-monohydrate, (MACROBID) 100 MG capsule twice a day for 10days, follow up PRN  Recent consult visits:  03/30/21-Steven Tilles DPM, Podiatry, follow up from cyst removal   Hospital visits:  02/15/21-[facility], [name], excision of ganglion of left foot     Medications: Outpatient Encounter Medications as of 04/19/2021  Medication Sig   ALPRAZolam (XANAX) 0.5 MG tablet Take 1 tablet (0.5 mg total) by mouth 3 (three) times daily.   Aspirin-Acetaminophen (GOODYS BODY PAIN PO) Take 1 packet by mouth daily.   clopidogrel (PLAVIX) 75 MG tablet TAKE ONE TABLET BY MOUTH EVERY MORNING   diclofenac Sodium (VOLTAREN) 1 % GEL    fenofibrate 160 MG tablet TAKE ONE TABLET BY MOUTH EVERY MORNING   fluticasone (FLONASE) 50 MCG/ACT nasal spray INSTILL 1 SPRAY IN EACH NOSTRIL ONCE DAILY   Fluticasone-Salmeterol,sensor, (AIRDUO DIGIHALER) 113-14 MCG/ACT AEPB Inhale 1 puff into the lungs in the morning and at bedtime.   glucose blood (ONETOUCH VERIO) test strip USE DAILY TO CHECK YOUR BLOOD SUGAR (E11.69)   lansoprazole (PREVACID) 30 MG capsule TAKE ONE CAPSULE BY MOUTH BEFORE BREAKFAST   lisinopril-hydrochlorothiazide (ZESTORETIC) 20-25 MG tablet Take 1 tablet by mouth daily.   nitrofurantoin, macrocrystal-monohydrate, (MACROBID) 100 MG capsule Take 1 capsule (100 mg total) by mouth 2 (two) times daily.   nitroGLYCERIN (NITROSTAT) 0.4 MG SL tablet Place 1 tablet (0.4 mg total) under the tongue every 5 (five) minutes as needed.   ONETOUCH VERIO test strip USE DAILY TO CHECK YOUR BLOOD SUGAR (E11.69) Test 1 x daily   oxyCODONE-acetaminophen (PERCOCET) 10-325 MG tablet Take 1 tablet by mouth every 6 (six) hours as needed.    rosuvastatin (CRESTOR) 20 MG tablet TAKE ONE TABLET BY MOUTH EVERYDAY AT BEDTIME   tiZANidine (ZANAFLEX) 4 MG tablet 4 mg as needed. Take 1/2 tablet as needed   TRUEplus Lancets 30G MISC 1 each by Does not apply route 2 (two) times daily.   TRULICITY 3 0000000 SOPN Inject 3 mg as directed once a week.   valACYclovir (VALTREX) 1000 MG tablet TAKE TWO TABLETS BY MOUTH twice A DAY FOR ONE DAY as needed   VASCEPA 1 g capsule TAKE TWO CAPSULES BY MOUTH EVERY MORNING and TAKE TWO TABLETS BY MOUTH EVERYDAY AT BEDTIME   No facility-administered encounter medications on file as of 04/19/2021.   Reviewed chart for medication changes ahead of medication coordination call.  No OVs, Consults, or hospital visits since last care coordination call/Pharmacist visit. (If appropriate, list visit date, provider name)  No medication changes indicated OR if recent visit, treatment plan here.  BP Readings from Last 3 Encounters:  03/13/21 130/72  02/27/21 124/78  12/19/20 (!) 152/84    Lab Results  Component Value Date   HGBA1C 6.4 (H) 02/27/2021     Patient obtains medications through Adherence Packaging  30 Days   Last adherence delivery included:  Clopidogrel '75mg'$ - Breakfast Fenofibrate '160mg'$ -breakfast Lamotrigine '100mg'$  --bedtime Lansoprazole '30mg'$ - before breakfast Lisinopril HCTZ 20-25-breakfast Rosuvastatin '20mg'$ -bedtime Trulicity pens Vascepa 1G- 2 breakfast  2 evening meal   Patient declined (meds) last month due to PRN use/additional supply on hand. Flonase-PRN  has some on hand, will call if needed  Patient is due for next adherence delivery on: 04/26/21. Called patient and reviewed  medications and coordinated delivery.  This delivery to include: Clopidogrel '75mg'$ - Breakfast Fenofibrate '160mg'$ -breakfast Lamotrigine '100mg'$  --bedtime Lansoprazole '30mg'$ - before breakfast Lisinopril HCTZ 20-25-breakfast Rosuvastatin '20mg'$ -bedtime Trulicity pens Vascepa 1G- 2 breakfast  2 evening meal    Patient declined the following medications (meds) due to (reason) Test strips-get from [facility], has on hand Lancets-get from [facility] , has on hand  Patient needs refills for  Lamictal '100mg'$    Confirmed delivery date of 04/26/21, advised patient that pharmacy will contact them the morning of delivery.  Clarita Leber, [place] Pharmacist Assistant [phone]

## 2021-04-22 ENCOUNTER — Other Ambulatory Visit: Payer: Self-pay | Admitting: Family Medicine

## 2021-04-22 MED ORDER — LAMOTRIGINE 100 MG PO TABS: 100.0000 mg | ORAL_TABLET | Freq: Every day | ORAL | 3 refills | Status: DC

## 2021-05-04 NOTE — Progress Notes (Signed)
Chronic Care Management Pharmacy Note  05/08/2021 Name:  Sonya Small MRN:  [id number] DOB:  1950-10-04   Plan Recommendations:  Will review upcoming cholesterol panel to consider increase in Crestor due to CAD/stroke history goal of LDL <55.  Patient reports she has an eye doctor appoitnment coming up. Anxiety is worsened. Her brother is diagnosed with terminal cancer and under hospice care. She is tearful and reports that it is killing her to watch. She states he medication is not covering her symptoms. Discussed symptoms and GAD7/PHQ9 scores with Dr. [name]. Discussed option of increasing Lamotrigine to 200 mg daily as a trial until upcoming appointment. Pharmacist  sending refill and coordinating fill with [facility] per Dr. Alyse Low verbal approval. Patient sees Dr. [name] 09/09 for routine visit. Discussed option of genetic testing for medication. Patient would like to consider with next appointment if Dr. [name] agrees.  Taking 1-2 Goody's daily again. Discussed risks and concerns. Patient reports that she resumed these on her own.  Patient stated she needs refill on [facility] - Pharmacist requested from Dr. Alyse Low nurse. .  1.5 pack per day smoking currently.Patient reports that she is more anxious and smoking more. She is not ready to quit at this time.    Subjective: Sonya Small is an 70 y.o. year old female who is a primary patient of [name], [name], MD.  The CCM team was consulted for assistance with disease management and care coordination needs.    Engaged with patient by telephone for follow up visit in response to provider referral for pharmacy case management and/or care coordination services.   Consent to Services:  The patient was given information about Chronic Care Management services, agreed to services, and gave verbal consent prior to initiation of services.  Please see initial visit note for detailed documentation.   Patient Care Team: [name], MD as PCP -  General (Family Medicine) [name], [name], MD as PCP - Cardiology (Cardiology) Burnice Logan, [facility] as Pharmacist (Pharmacist)  Recent office visits: 03/13/2021 - macrobid bid for cystitis.  02/27/2021 - check if taking lamotrigine and tamsulosin. Change lisinopril/hctz 20/25 mg daily due to spilling protein in urine.  11/25/2020 - Patient had left when UA came back. We tried to reach her all morning to get a ct urogram. She called back at 11:30 approximately. Offered to order ct urogram today or treat her empirically with flomax 0.4 mg one twice a day and LOTS of fluids over the weekend. If she worsens, she can go to the ED. If no better by Monday,call and will order ct urogram.  10/27/2020 - lamotrigine 100 mg daily instead of abilify.   Recent consult visits: 03/30/2021 - podiatry - 6 weeks follow-up after ganglion cyst removal.  02/28/2021 - podiatry - 2 week follow-up after left foot surgery.  02/21/2021 - podiatry - post-op. 02/07/2021 - podiatry - schedule removal of ganglion cyst.  01/17/2021 - podiatry - diagnosis of ganglion cyst. Requested PCP records to proceed with surgery.  12/19/2020 - cardiology - recommend stop taking good powders. BP well controlled. Recommend smoking cessation.   10/21/2020 - Cardio - recommend smoking cessation. Asymptomatic CAD.  10/19/2020 - Cardio - upper back/shoulder pain for 2 weeks. Relieve nitroglycerin advised to go to ED.   Hospital visits: 10/19/2020 - ED - ruled out cardiac cause.   Objective:  Lab Results  Component Value Date   CREATININE 0.65 03/29/2021   BUN 18 03/29/2021   GFRNONAA >60 10/19/2020  GFRAA 97 10/03/2020   NA 145 (H) 03/29/2021   K 4.0 03/29/2021   CALCIUM 9.4 03/29/2021   CO2 24 03/29/2021    Lab Results  Component Value Date/Time   HGBA1C 6.4 (H) 02/27/2021 10:15 AM   HGBA1C 6.2 (H) 11/25/2020 08:14 AM   MICROALBUR neg 02/27/2021 10:10 AM   MICROALBUR 80 11/25/2020 08:27 AM    Last diabetic Eye exam: No  results found for: HMDIABEYEEXA  Last diabetic Foot exam: No results found for: HMDIABFOOTEX   Lab Results  Component Value Date   CHOL 129 02/27/2021   HDL 43 02/27/2021   LDLCALC 65 02/27/2021   TRIG 116 02/27/2021   CHOLHDL 3.0 02/27/2021    Hepatic Function Latest Ref Rng & Units 03/29/2021 02/27/2021 11/25/2020  Total Protein 6.0 - 8.5 g/dL 6.5 6.9 6.7  Albumin 3.7 - 4.7 g/dL 4.4 4.4 4.3  AST 0 - 40 IU/L 17 20 16   ALT 0 - 32 IU/L 15 15 15   Alk Phosphatase 44 - 121 IU/L 80 82 84  Total Bilirubin 0.0 - 1.2 mg/dL <0.2 0.2 <0.2  Bilirubin, Direct 0.0 - 0.2 mg/dL - - -    Lab Results  Component Value Date/Time   TSH 0.619 12/04/2019 10:20 AM   TSH 1.130 07/13/2014 01:08 AM    CBC Latest Ref Rng & Units 02/27/2021 11/25/2020 10/19/2020  WBC 3.4 - 10.8 x10E3/uL 10.9(H) 9.1 7.2  Hemoglobin 11.1 - 15.9 g/dL 15.0 14.4 13.9  Hematocrit 34.0 - 46.6 % 46.4 44.4 44.1  Platelets 150 - 450 x10E3/uL 302 250 239    No results found for: VD25OH  Clinical ASCVD: Yes  The ASCVD Risk score Mikey Bussing DC Jr., et al., 2013) failed to calculate for the following reasons:   The patient has a prior MI or stroke diagnosis    Depression screen [facility] 2/9 05/08/2021 02/27/2021 08/17/2020  Decreased Interest 3 3 3   Down, Depressed, Hopeless 3 3 3   PHQ - 2 Score 6 6 6   Altered sleeping 3 2 3   Tired, decreased energy 3 2 3   Change in appetite 3 0 2  Feeling bad or failure about yourself  2 3 3   Trouble concentrating 3 1 3   Moving slowly or fidgety/restless 3 1 3   Suicidal thoughts 0 0 1  PHQ-9 Score 23 15 24   Difficult doing work/chores - - -    PHQ9 SCORE ONLY 05/08/2021 02/27/2021 08/17/2020  PHQ-9 Total Score 23 15 24    GAD 7 : Generalized Anxiety Score 05/08/2021 08/10/2020  Nervous, Anxious, on Edge 3 3  Control/stop worrying 3 3  Worry too much - different things 3 3  Trouble relaxing 3 3  Restless 1 3  Easily annoyed or irritable 3 3  Afraid - awful might happen 3 3  Total GAD 7 Score 19 21   Anxiety Difficulty - Very difficult       Social History   Tobacco Use  Smoking Status Every Day   Packs/day: 0.50   Years: 54.00   Pack years: 27.00   Types: Cigarettes   Last attempt to quit: 11/20/2012   Years since quitting: 8.4  Smokeless Tobacco Never  Tobacco Comments   2 ppd for many years.    BP Readings from Last 3 Encounters:  03/13/21 130/72  02/27/21 124/78  12/19/20 (!) 152/84   Pulse Readings from Last 3 Encounters:  03/13/21 94  02/27/21 100  12/19/20 94   Wt Readings from Last 3 Encounters:  03/13/21 162 lb (  73.5 kg)  02/27/21 161 lb 6.4 oz (73.2 kg)  12/19/20 169 lb (76.7 kg)    Assessment/Interventions: Review of patient past medical history, allergies, medications, health status, including review of consultants reports, laboratory and other test data, was performed as part of comprehensive evaluation and provision of chronic care management services.   SDOH:  (Social Determinants of Health) assessments and interventions performed: Yes SDOH Interventions    Flowsheet Row Most Recent Value  SDOH Interventions   Depression Interventions/Treatment  Medication       CCM Care Plan  Allergies  Allergen Reactions   Abilify [Aripiprazole]     confusion   Latex Rash    Medications Reviewed Today     Reviewed by Burnice Logan, [facility] (Pharmacist) on 05/08/21 at 1110  Med List Status: <None>   Medication Order Taking? Sig Documenting Provider Last Dose Status Informant  ALPRAZolam (XANAX) 0.5 MG tablet [id number] Yes Take 1 tablet (0.5 mg total) by mouth 3 (three) times daily. [name], MD Taking Active   Aspirin-Acetaminophen (GOODYS BODY PAIN PO) [id number] Yes Take 1 packet by mouth daily. [provider] Taking Active   clopidogrel (PLAVIX) 75 MG tablet [id number] Yes TAKE ONE TABLET BY MOUTH EVERY MORNING Marge Duncans, PA-C Taking Active   diclofenac Sodium (VOLTAREN) 1 % GEL [id number] Yes  [provider] Taking Active    fenofibrate 160 MG tablet [id number] Yes TAKE ONE TABLET BY MOUTH EVERY MORNING Marge Duncans, PA-C Taking Active   fluticasone (FLONASE) 50 MCG/ACT nasal spray [id number] Yes INSTILL 1 SPRAY IN EACH NOSTRIL ONCE DAILY [name], [name], MD Taking Active   fluticasone furoate-vilanterol (BREO ELLIPTA) 200-25 MCG/INH AEPB [id number] Yes Inhale 1 puff into the lungs daily. [provider] Taking Active   Fluticasone-Salmeterol,sensor, (AIRDUO [facility]) 113-14 MCG/ACT AEPB [id number] No Inhale 1 puff into the lungs in the morning and at bedtime.  Patient not taking: Reported on 05/08/2021   [provider] Not Taking Consider Medication Status and Discontinue   glucose blood (ONETOUCH VERIO) test strip [id number] Yes USE DAILY TO CHECK YOUR BLOOD SUGAR (E11.69) [name], [name], MD Taking Active   lamoTRIgine (LAMICTAL) 100 MG tablet [id number] Yes Take 1 tablet (100 mg total) by mouth at bedtime. [name], MD Taking Active   lansoprazole (PREVACID) 30 MG capsule [id number] Yes TAKE ONE CAPSULE BY MOUTH BEFORE BREAKFAST Marge Duncans, PA-C Taking Active   lisinopril-hydrochlorothiazide (ZESTORETIC) 20-25 MG tablet [id number] Yes Take 1 tablet by mouth daily. [name], [name], MD Taking Active   nitrofurantoin, macrocrystal-monohydrate, (MACROBID) 100 MG capsule [id number] No Take 1 capsule (100 mg total) by mouth 2 (two) times daily.  Patient not taking: Reported on 05/08/2021   Marge Duncans, PA-C Not Taking Consider Medication Status and Discontinue   nitroGLYCERIN (NITROSTAT) 0.4 MG SL tablet [id number] Yes Place 1 tablet (0.4 mg total) under the tongue every 5 (five) minutes as needed. [name], MD Taking Active   [facility] VERIO test strip [id number] Yes USE DAILY TO CHECK YOUR BLOOD SUGAR (E11.69) Test 1 x daily [name], [name], MD Taking Active   oxyCODONE-acetaminophen (PERCOCET) 10-325 MG tablet [id number] Yes Take 1 tablet by mouth every 6 (six) hours as needed. [provider] Taking Active    rosuvastatin (CRESTOR) 20 MG tablet [id number] Yes TAKE ONE TABLET BY MOUTH EVERYDAY AT BEDTIME Marge Duncans, PA-C Taking Active   tiZANidine (ZANAFLEX) 4 MG tablet [id number] Yes 4 mg as needed. Take 1/2 tablet as needed [provider] Taking Active   TRUEplus Lancets  30G MISC [id number] Yes 1 each by Does not apply route 2 (two) times daily. [name], MD Taking Active   TRULICITY 3 NV/9.72YO Bonney Aid [id number] Yes Inject 3 mg as directed once a week. Marge Duncans, PA-C Taking Active   valACYclovir (VALTREX) 1000 MG tablet [id number] Yes TAKE TWO TABLETS BY MOUTH twice A DAY FOR ONE DAY as needed [name], MD Taking Active   VASCEPA 1 g capsule [id number] Yes TAKE TWO CAPSULES BY MOUTH EVERY MORNING and TAKE TWO TABLETS BY MOUTH EVERYDAY AT BEDTIME Marge Duncans, PA-C Taking Active             Patient Active Problem List   Diagnosis Date Noted   Cystitis with hematuria 03/13/2021   Ganglion cyst of left foot 01/17/2021   Severe recurrent major depression without psychotic features ([place]) 05/09/2020   Atypical pigmented skin lesion 05/09/2020   Dizziness 02/07/2020   Dermatitis 01/12/2020   Mixed stress and urge urinary incontinence 12/25/2019   Localized edema 12/18/2019   Dry mouth 12/18/2019   Diabetic polyneuropathy associated with type 2 diabetes mellitus ([place]) 12/16/2019   Weakness 12/07/2019   Ingrown toenail of both feet 12/07/2019   Moderate recurrent major depression ([place]) 12/03/2019   Cigarette nicotine dependence with nicotine-induced disorder 12/03/2019   Type 2 diabetes mellitus with hypertriglyceridemia ([place]) 12/03/2019   Hypertensive heart disease    Oral thrush 10/28/2015   Pituitary adenoma ([place]) 02/22/2015   Cerebral infarction due to thrombosis of left middle cerebral artery ([place]) 08/23/2014   HLD (hyperlipidemia) 08/23/2014   Essential hypertension 08/23/2014   PVD (peripheral vascular disease) ([place]) 08/23/2014   Former smoker 08/23/2014   COPD (chronic  obstructive pulmonary disease) ([place]) 07/13/2014   OSA (obstructive sleep apnea) 07/13/2014   Chronic diastolic heart failure ([place]) 07/13/2014   Stroke ([place]) 07/12/2014   HTN (hypertension) 07/12/2014   Peripheral vascular disease, unspecified ([place]) 02/17/2013   Aftercare following surgery of the circulatory system, NEC 02/17/2013   Chest pain 10/16/2012   Coronary artery disease    PAD (peripheral artery disease) ([place])    Hyperlipidemia    Tobacco abuse    History of myocardial infarction 01/22/2010    Immunization History  Administered Date(s) Administered   Fluad Quad(high Dose 65+) 08/29/2020   Influenza,inj,Quad PF,6+ Mos 07/13/2014   Influenza-Unspecified 05/29/2019   Moderna Sars-Covid-2 Vaccination 07/11/2020, 08/01/2020   Pneumococcal Conjugate-13 01/20/2015   Pneumococcal Polysaccharide-23 06/26/2011    Conditions to be addressed/monitored:  Hypertension, Hyperlipidemia, Diabetes, Heart Failure, Coronary Artery Disease and COPD  Care Plan : ccm pharmacy care plan  Updates made by Burnice Logan, [place] since 05/08/2021 12:00 AM     Problem: htn,hld,dm   Priority: High  Onset Date: 11/15/2020     Long-Range Goal: disease state management   Start Date: 11/15/2020  Expected End Date: 11/15/2021  Recent Progress: On track  Priority: High  Note:   Current Barriers:  Unable to maintain control of anxiety/depression currently.   Needs testing supplies through part B benefit.   Pharmacist Clinical Goal(s):  Over the next 90 days, patient will maintain control of blood sugar  as evidenced by a1c  through collaboration with PharmD and provider.   Interventions: 1:1 collaboration with [name], MD regarding development and update of comprehensive plan of care as evidenced by provider attestation and co-signature Inter-disciplinary care team collaboration (see longitudinal plan of care) Comprehensive medication review performed; medication list updated in electronic  medical record  Hypertension (BP goal <130/80) -controlled -Current treatment:  Lisinopril-hydrochlorothiazide 20/25 mg every morning -Medications previously tried: amlodipine  -Current home readings: not checking at this time.  -Current dietary habits: enjoying fruit. Cooks at home. Reports limited appetite since brother has been ill.  -Current exercise habits: minimal due to back pain and breathing  -Denies hypotensive/hypertensive symptoms -Educated on BP goals and benefits of medications for prevention of heart attack, stroke and kidney damage; Daily salt intake goal < 2300 mg; Importance of home blood pressure monitoring; -Counseled to monitor BP at home daily, document, and provide log at future appointments -Counseled on diet and exercise extensively Recommended to continue current medication  Hyperlipidemia/CAD: (LDL goal < 55) -uncontrolled -Current treatment: rosuvastatin 20 mg daily at bedtime  Fenofibrate 160 mg daily am  Plavix 75 mg daily  Vascepa 1 gram 2 capsules bid  -Medications previously tried: none reported -Current dietary patterns: cooks at home. Eats meatloaf, pinto beans, stewed potatoes, homemade biscuits -Current exercise habits: limited due to back pain and shortness of breath  -Educated on Cholesterol goals;  Benefits of statin for ASCVD risk reduction; Importance of limiting foods high in cholesterol; -Counseled on diet and exercise extensively Recommended to continue current medication. Will review upcoming cholesterol panel in September to consider change in regimen due to CAD/stroke history goal of LDL <55.   Diabetes (A1c goal <7%) -controlled -Current medications: Onetouch Verio Trulicity 3 mg weekly  -Medications previously tried: none reported today  -Current home glucose readings fasting glucose: 96-167 mg/dL -Denies hypoglycemic/hyperglycemic symptoms -Current meal patterns:  breakfast: fruit  Lunch and supper:  variety of meat and  vegtables drinks: nature's twist, water, diet drinks -Current exercise: limited due to back pain and breathing -Educated onA1c and blood sugar goals; Complications of diabetes including kidney damage, retinal damage, and cardiovascular disease; Benefits of routine self-monitoring of blood sugar; -Counseled to check feet daily and get yearly eye exams -Counseled on diet and exercise extensively Recommended to continue current medication  Depression/Anxiety (Goal: manage symptoms) -Uncontrolled -Current treatment: Lamotrigine 100 mg daily  Alprazolam 0.5 mg tid prn  -Medications previously tried/failed: Abilify  -PHQ9: 23 -GAD7: 19 -Recommended connecting with counselor or psychiatry for mental health support - patient declines and states she talks to her dog.  -Educated on Benefits of medication for symptom control -Collaborated with Dr. [name] to determine options for patient - Dr. [name] approved increasing lamotrigine 200 mg daily. Patient sees Dr. [name] 06/02/2021.  Pharmacist discussed option of considering genesight testing. Patient would like to discuss with Dr. [name] more at upcoming appointment.    Osteoporosis / Osteopenia (Goal prevent fracture or broken bones) -controlled -Last DEXA Scan:  T-Score femoral neck: -2.3  T-Score lumbar spine: -2.1  10-year probability of major osteoporotic fracture: 12.4%  10-year probability of hip fracture: 2.4% -Patient is not a candidate for pharmacologic treatment -Current treatment  Diet and lifestyle -Medications previously tried: none reported  -Recommend [phone] units of vitamin D daily. Recommend 1200 mg of calcium daily from dietary and supplemental sources. Recommend weight-bearing and muscle strengthening exercises for building and maintaining bone density. -Counseled on diet and exercise extensively   Patient Goals/Self-Care Activities Over the next 90 days, patient will:  - take medications as prescribed check glucose daily,  document, and provide at future appointments check blood pressure daily, document, and provide at future appointments  Follow Up Plan: Telephone follow up appointment with care management team member scheduled for: 06/2021 to review cholesterol panel and repeat PHQ9/GAD7 and  monthly with pharmacy team for medication coordination  Medication Assistance: None required.  Patient affirms current coverage meets needs.  Patient's preferred pharmacy is:  [id number] [place], [id number]: [phone]: [phone] [facility] [place] [address] [id number]: [phone]: [place]: [phone]: [phone] Uses pill box? Yes Pt endorses good compliance  We discussed: Benefits of medication synchronization, packaging and delivery as well as enhanced pharmacist oversight with [facility]. Patient decided to: Utilize [facility] pharmacy for medication synchronization, packaging and delivery  Care Plan and Follow Up Patient Decision:  Patient agrees to Care Plan and Follow-up.  Plan: Telephone follow up appointment with care management team member scheduled for:  10/2021 unless needed sooner. Pharmacy team will speak with patient monthly to coordinate fills.

## 2021-05-08 ENCOUNTER — Ambulatory Visit (INDEPENDENT_AMBULATORY_CARE_PROVIDER_SITE_OTHER): Payer: Medicare Other

## 2021-05-08 ENCOUNTER — Other Ambulatory Visit: Payer: Self-pay

## 2021-05-08 DIAGNOSIS — F332 Major depressive disorder, recurrent severe without psychotic features: Secondary | ICD-10-CM | POA: Diagnosis not present

## 2021-05-08 DIAGNOSIS — E782 Mixed hyperlipidemia: Secondary | ICD-10-CM

## 2021-05-08 DIAGNOSIS — E781 Pure hyperglyceridemia: Secondary | ICD-10-CM | POA: Diagnosis not present

## 2021-05-08 DIAGNOSIS — E1169 Type 2 diabetes mellitus with other specified complication: Secondary | ICD-10-CM

## 2021-05-08 DIAGNOSIS — I119 Hypertensive heart disease without heart failure: Secondary | ICD-10-CM

## 2021-05-08 MED ORDER — LAMOTRIGINE 200 MG PO TABS: 200.0000 mg | ORAL_TABLET | Freq: Every day | ORAL | 0 refills | Status: DC

## 2021-05-08 NOTE — Patient Instructions (Signed)
Visit Information   Goals Addressed             This Visit's Progress    Improve My Heart Health-Coronary Artery Disease   On track    Timeframe:  Long-Range Goal Priority:  High Start Date:          11/15/2020                   Expected End Date:            11/15/2021           Follow Up Date 05/08/2021    - be open to making changes - if I have chest pain, call for help    Why is this important?   Lifestyle changes are key to improving the blood flow to your heart. Think about the things you can change and set a goal to live healthy.  Remember, when the blood vessels to your heart start to get clogged you may not have any symptoms.  Over time, they can get worse.  Don't ignore the signs, like chest pain, and get help right away.     Notes:      Learn More About My Health   On track    Timeframe:  Long-Range Goal Priority:  High Start Date:           11/15/2020                  Expected End Date:             11/15/2021           Follow Up Date 05/08/2021    - make a list of questions - repeat what I heard to make sure I understand - bring a list of my medicines to the visit - speak up when I don't understand    Why is this important?   The best way to learn about your health and care is by talking to the doctor and nurse.  They will answer your questions and give you information in the way that you like best.    Notes:      Manage My Medicine   On track    Timeframe:  Long-Range Goal Priority:  High Start Date:       11/15/2020                      Expected End Date:    11/15/2021                   Follow Up Date 05/08/2021    - call for medicine refill 2 or 3 days before it runs out - keep a list of all the medicines I take; vitamins and herbals too - use a pillbox to sort medicine    Why is this important?   These steps will help you keep on track with your medicines.   Notes:        Patient Care Plan: ccm pharmacy care plan     Problem  Identified: [id number],[id number],dm   Priority: High  Onset Date: 11/15/2020     Long-Range Goal: disease state management   Start Date: 11/15/2020  Expected End Date: 11/15/2021  Recent Progress: On track  Priority: High  Note:   Current Barriers:  Unable to maintain control of anxiety/depression currently.   Needs testing supplies through part B benefit.   Pharmacist Clinical Goal(s):  Over the next 90 days,  patient will maintain control of blood sugar  as evidenced by a1c  through collaboration with PharmD and provider.   Interventions: 1:1 collaboration with [name], MD regarding development and update of comprehensive plan of care as evidenced by provider attestation and co-signature Inter-disciplinary care team collaboration (see longitudinal plan of care) Comprehensive medication review performed; medication list updated in electronic medical record  Hypertension (BP goal <130/80) -controlled -Current treatment: Lisinopril-hydrochlorothiazide 20/25 mg every morning -Medications previously tried: amlodipine  -Current home readings: not checking at this time.  -Current dietary habits: enjoying fruit. Cooks at home. Reports limited appetite since brother has been ill.  -Current exercise habits: minimal due to back pain and breathing  -Denies hypotensive/hypertensive symptoms -Educated on BP goals and benefits of medications for prevention of heart attack, stroke and kidney damage; Daily salt intake goal < 2300 mg; Importance of home blood pressure monitoring; -Counseled to monitor BP at home daily, document, and provide log at future appointments -Counseled on diet and exercise extensively Recommended to continue current medication  Hyperlipidemia/CAD: (LDL goal < 55) -uncontrolled -Current treatment: rosuvastatin 20 mg daily at bedtime  Fenofibrate 160 mg daily am  Plavix 75 mg daily  Vascepa 1 gram 2 capsules bid  -Medications previously tried: none reported -Current dietary  patterns: cooks at home. Eats meatloaf, pinto beans, stewed potatoes, homemade biscuits -Current exercise habits: limited due to back pain and shortness of breath  -Educated on Cholesterol goals;  Benefits of statin for ASCVD risk reduction; Importance of limiting foods high in cholesterol; -Counseled on diet and exercise extensively Recommended to continue current medication. Will review upcoming cholesterol panel in September to consider change in regimen due to CAD/stroke history goal of LDL <55.   Diabetes (A1c goal <7%) -controlled -Current medications: Onetouch Verio Trulicity 3 mg weekly  -Medications previously tried: none reported today  -Current home glucose readings fasting glucose: 96-167 mg/dL -Denies hypoglycemic/hyperglycemic symptoms -Current meal patterns:  breakfast: fruit  Lunch and supper:  variety of meat and vegtables drinks: nature's twist, water, diet drinks -Current exercise: limited due to back pain and breathing -Educated onA1c and blood sugar goals; Complications of diabetes including kidney damage, retinal damage, and cardiovascular disease; Benefits of routine self-monitoring of blood sugar; -Counseled to check feet daily and get yearly eye exams -Counseled on diet and exercise extensively Recommended to continue current medication  Depression/Anxiety (Goal: manage symptoms) -Uncontrolled -Current treatment: Lamotrigine 100 mg daily  Alprazolam 0.5 mg tid prn  -Medications previously tried/failed: Abilify  -PHQ9: 23 -GAD7: 19 -Recommended connecting with counselor or psychiatry for mental health support - patient declines and states she talks to her dog.  -Educated on Benefits of medication for symptom control -Collaborated with Dr. [name] to determine options for patient - Dr. [name] approved increasing lamotrigine 200 mg daily. Patient sees Dr. [name] 06/02/2021.  Pharmacist discussed option of considering genesight testing. Patient would like to discuss  with Dr. [name] more at upcoming appointment.    Osteoporosis / Osteopenia (Goal prevent fracture or broken bones) -controlled -Last DEXA Scan:  T-Score femoral neck: -2.3  T-Score lumbar spine: -2.1  10-year probability of major osteoporotic fracture: 12.4%  10-year probability of hip fracture: 2.4% -Patient is not a candidate for pharmacologic treatment -Current treatment  Diet and lifestyle -Medications previously tried: none reported  -Recommend [phone] units of vitamin D daily. Recommend 1200 mg of calcium daily from dietary and supplemental sources. Recommend weight-bearing and muscle strengthening exercises for building and maintaining bone density. -Counseled on diet and  exercise extensively   Patient Goals/Self-Care Activities Over the next 90 days, patient will:  - take medications as prescribed check glucose daily, document, and provide at future appointments check blood pressure daily, document, and provide at future appointments  Follow Up Plan: Telephone follow up appointment with care management team member scheduled for: 06/2021 to review cholesterol panel and repeat PHQ9/GAD7 and  monthly with pharmacy team for medication coordination      The patient verbalized understanding of instructions, educational materials, and care plan provided today and declined offer to receive copy of patient instructions, educational materials, and care plan.  Telephone follow up appointment with pharmacy team member scheduled for: 05/2021 with pharmacy team for medication coordination and 06/2021 with pharmacist to review cholesterol panel/GAD7/PHQ9  Burnice Logan, [facility]

## 2021-05-09 ENCOUNTER — Other Ambulatory Visit: Payer: Self-pay

## 2021-05-09 ENCOUNTER — Telehealth: Payer: Self-pay

## 2021-05-09 DIAGNOSIS — E1169 Type 2 diabetes mellitus with other specified complication: Secondary | ICD-10-CM

## 2021-05-09 DIAGNOSIS — R5383 Other fatigue: Secondary | ICD-10-CM

## 2021-05-09 DIAGNOSIS — I10 Essential (primary) hypertension: Secondary | ICD-10-CM

## 2021-05-09 MED ORDER — ALPRAZOLAM 0.5 MG PO TABS: 0.5000 mg | ORAL_TABLET | Freq: Three times a day (TID) | ORAL | 0 refills | Status: DC

## 2021-05-09 NOTE — Chronic Care Management (AMB) (Addendum)
Chronic Care Management Pharmacy Assistant   Name: Sonya Small  MRN: [id number] DOB: September 02, 1951  Reason for Encounter: Medication Change   05/09/2021- Called patient to inform of increase on Lamotrigine from 100 mg daily to 200 mg daily. No answer, left message to return call. Acute fill form to [facility] filled out awaiting return call from patient prior to sending.  Patient returned call, she is aware of Sherre Poot, CPP and PCP recommendations to increase Lamotrigine to 200 mg, instructions given to continue with '100mg'$  tablet in packaging and add 1- '100mg'$  tablet from vial which will be delivered on 05/10/2021. Patient voices understanding and aware with new medication packages 200 mg of Lamotrigine will be in packs for 1 tablet at night. Form uploaded to [employer], technician notified.  05/10/2021- Pharmacy unable to fill 100 mg short supply due to insurance, 200 mg of Lamotrigine delivered to patient on 05/09/2021, spoke with patient, she is aware to cut 200 mg in half and take with 100 mg tablet which is in her pack of bedtime medications. Patient voices understanding, [name], CPP notified.  Medications: Outpatient Encounter Medications as of 05/09/2021  Medication Sig   ALPRAZolam (XANAX) 0.5 MG tablet Take 1 tablet (0.5 mg total) by mouth 3 (three) times daily.   Aspirin-Acetaminophen (GOODYS BODY PAIN PO) Take 1 packet by mouth daily.   clopidogrel (PLAVIX) 75 MG tablet TAKE ONE TABLET BY MOUTH EVERY MORNING   diclofenac Sodium (VOLTAREN) 1 % GEL    fenofibrate 160 MG tablet TAKE ONE TABLET BY MOUTH EVERY MORNING   fluticasone (FLONASE) 50 MCG/ACT nasal spray INSTILL 1 SPRAY IN EACH NOSTRIL ONCE DAILY   fluticasone furoate-vilanterol (BREO ELLIPTA) 200-25 MCG/INH AEPB Inhale 1 puff into the lungs daily.   Fluticasone-Salmeterol,sensor, (AIRDUO DIGIHALER) 113-14 MCG/ACT AEPB Inhale 1 puff into the lungs in the morning and at bedtime. (Patient not taking: Reported on  05/08/2021)   glucose blood (ONETOUCH VERIO) test strip USE DAILY TO CHECK YOUR BLOOD SUGAR (E11.69)   lamoTRIgine (LAMICTAL) 200 MG tablet Take 1 tablet (200 mg total) by mouth at bedtime.   lansoprazole (PREVACID) 30 MG capsule TAKE ONE CAPSULE BY MOUTH BEFORE BREAKFAST   lisinopril-hydrochlorothiazide (ZESTORETIC) 20-25 MG tablet Take 1 tablet by mouth daily.   nitrofurantoin, macrocrystal-monohydrate, (MACROBID) 100 MG capsule Take 1 capsule (100 mg total) by mouth 2 (two) times daily. (Patient not taking: Reported on 05/08/2021)   nitroGLYCERIN (NITROSTAT) 0.4 MG SL tablet Place 1 tablet (0.4 mg total) under the tongue every 5 (five) minutes as needed.   ONETOUCH VERIO test strip USE DAILY TO CHECK YOUR BLOOD SUGAR (E11.69) Test 1 x daily   oxyCODONE-acetaminophen (PERCOCET) 10-325 MG tablet Take 1 tablet by mouth every 6 (six) hours as needed.   rosuvastatin (CRESTOR) 20 MG tablet TAKE ONE TABLET BY MOUTH EVERYDAY AT BEDTIME   tiZANidine (ZANAFLEX) 4 MG tablet 4 mg as needed. Take 1/2 tablet as needed   TRUEplus Lancets 30G MISC 1 each by Does not apply route 2 (two) times daily.   TRULICITY 3 0000000 SOPN Inject 3 mg as directed once a week.   valACYclovir (VALTREX) 1000 MG tablet TAKE TWO TABLETS BY MOUTH twice A DAY FOR ONE DAY as needed   VASCEPA 1 g capsule TAKE TWO CAPSULES BY MOUTH EVERY MORNING and TAKE TWO TABLETS BY MOUTH EVERYDAY AT BEDTIME   No facility-administered encounter medications on file as of 05/09/2021.    Care Gaps: OPHTHALMOLOGY EXAM- Overdue - never  done  Hepatitis C Screening (Once)- Never Done  TETANUS/TDAP (Every 10 Years)-  Never Done COLONOSCOPY (Pts 45-67yr Insurance coverage will need to be confirmed) (Every 10 Years)- Never Done Zoster Vaccines- Shingrix (1 of 2)-  Jun 25, 2016  PNA vac Low Risk Adult (2 of 2 - PPSV23)- Last completed: Jan 20, 2015 COVID-19 Vaccine (2 - Moderna series)- Last completed: Aug 01, 2020 INFLUENZA VACCINE- Due April 24, 2021 Annual Wellness Exam- Due  Follow up visit with CCM Pharmacist SDonette Larryon 07/03/2021 at 2:00 PM  Star Rating Drugs: Rosuvastatin 20 mg- Last filled Last [facility]- Last [facility]/21/2021 [facility] [phone]

## 2021-05-17 ENCOUNTER — Telehealth: Payer: Self-pay

## 2021-05-17 NOTE — Progress Notes (Signed)
  Chronic Care Management   Note  05/17/2021 Name: Sonya Small MRN: [id number] DOB: [birth year]   [name] reports that lamotrigine 200 mg made her too sleepy. She tried cutting back and doing 150 mg daily and still sleepy during the day. She is requesting 125 mg daily if you approve. It would require 2 prescriptions of Lamotrigine to be called in (100 and 25 mg taken together daily). Please advise.   Sherre Poot, PharmD, [facility] Clinical Pharmacist [name] [phone] (office) [phone] (mobile)

## 2021-05-17 NOTE — Chronic Care Management (AMB) (Signed)
Chronic Care Management Pharmacy Assistant   Name: Sonya Small  MRN: [id number] DOB: 1950-10-14  Reason for Encounter: Medication Review/ Medication Coordination Call   Recent office visits:  05/08/2021- Sonya Small, [facility] (CCM Pharmacist)- Lamotrigine increased to 200 mg- once at bedtime.   Recent consult visits:  None  Hospital visits:  None in previous 6 months  Reviewed chart for medication changes ahead of medication coordination call.  No OVs, Consults, or hospital visits since last care coordination call/Pharmacist visit.  Recent medication changes indicated above.   BP Readings from Last 3 Encounters:  03/13/21 130/72  02/27/21 124/78  12/19/20 (!) 152/84    Lab Results  Component Value Date   HGBA1C 6.4 (H) 02/27/2021     Patient obtains medications through Adherence Packaging  30 Days   Last adherence delivery included:  Clopidogrel '75mg'$ - Breakfast Fenofibrate '160mg'$ -breakfast Lamotrigine '100mg'$  --bedtime Lansoprazole '30mg'$ - before breakfast Lisinopril HCTZ 20-25-breakfast Rosuvastatin '20mg'$ -bedtime Trulicity pens Vascepa 1G- 2 breakfast  2 evening meal    Patient declined the following medications last month:  [facility], has on hand Lancets-[facility] , has on hand  Patient is due for next adherence delivery on: 05/29/2021. Called patient and reviewed medications and coordinated delivery.  ** Patient called and spoke with Sonya Small, Clinical Pharmacist assistant and informed that she could not take the Lamotrigine 200 mg, she's only been taking 150 mg. Sonya Small inquired if she took one of the 100s and the cut it in half and she said yes then I asked her if she takes one of the 100s and cuts the 200s in half and take both and she also said yes. Patient also mentioned needing more Trulicity pen, taking last does tonight and will run out before delivery. Sonya Small sent acute form in to [facility] for delivery of pen on 05/22/2021. Total CCM time  28 mins.   During medication review call discuss more on Lamotrigine, patient stated that the Lamotrigine 200 mg with take 100 mg out of pack and cutting 200 mg in half out of bottles from recent change, made her very sleepy, she just slept all day so he tried cutting 50 mg of the 200 mg tablet cutting 1/4 of tablet from bottle and the 100 mg tablet from packs was better, she was not as drowsy or sleepy. Patient inquired if medication came in 125 mg, she feels that would work out better. Patient aware I will discuss with Sonya Small, Pharm D on change and update medication with [facility]. Spoke with Sonya Small, CPP she is aware of patient's side effects on 200 mgs and will send request to Dr. Tobie Small for 125 mg prescription. Patient aware of updates.   This delivery to include: Clopidogrel '75mg'$ - 1 tablet daily (Breakfast) Fenofibrate '160mg'$ - 1 tablet daily (breakfast) Lamotrigine 125 mg - take 100 mg and 25 mg once daily (bedtime) Lansoprazole '30mg'$ - 1 tablet daily (before breakfast) Lisinopril HCTZ 20-25-1 tablet daily (breakfast) Rosuvastatin '20mg'$ - 1 tablet daily (bedtime) Trulicity '3mg'$ /0.5 ml pens- Inject 3 mg once a week. Vascepa 1G- 2 tablets twice daily (breakfast and evening meal )  Patient will need a short fill of Trulicity '3mg'$ /0.5 ml pens , prior to adherence delivery.   Coordinated acute fill for Trulicity '3mg'$ /0.5 ml pens to be delivered 05/22/2021.  Patient declined the following medications: [facility] strips-get [facility], has on hand Lancets-[facility] , has on hand  Patient needs refills for: New prescription of 125 mg Lamotrigine.  Confirmed delivery date of 05/29/2021, advised patient that pharmacy will contact them the morning of delivery.  Medications: Outpatient Encounter Medications as of 05/17/2021  Medication Sig   ALPRAZolam (XANAX) 0.5 MG tablet Take 1 tablet (0.5 mg total) by mouth 3 (three) times daily.   Aspirin-Acetaminophen (GOODYS BODY PAIN PO) Take 1 packet by mouth  daily.   clopidogrel (PLAVIX) 75 MG tablet TAKE ONE TABLET BY MOUTH EVERY MORNING   diclofenac Sodium (VOLTAREN) 1 % GEL    fenofibrate 160 MG tablet TAKE ONE TABLET BY MOUTH EVERY MORNING   fluticasone (FLONASE) 50 MCG/ACT nasal spray INSTILL 1 SPRAY IN EACH NOSTRIL ONCE DAILY   fluticasone furoate-vilanterol (BREO ELLIPTA) 200-25 MCG/INH AEPB Inhale 1 puff into the lungs daily.   Fluticasone-Salmeterol,sensor, (AIRDUO DIGIHALER) 113-14 MCG/ACT AEPB Inhale 1 puff into the lungs in the morning and at bedtime. (Patient not taking: Reported on 05/08/2021)   glucose blood (ONETOUCH VERIO) test strip USE DAILY TO CHECK YOUR BLOOD SUGAR (E11.69)   lamoTRIgine (LAMICTAL) 200 MG tablet Take 1 tablet (200 mg total) by mouth at bedtime.   lansoprazole (PREVACID) 30 MG capsule TAKE ONE CAPSULE BY MOUTH BEFORE BREAKFAST   lisinopril-hydrochlorothiazide (ZESTORETIC) 20-25 MG tablet Take 1 tablet by mouth daily.   nitrofurantoin, macrocrystal-monohydrate, (MACROBID) 100 MG capsule Take 1 capsule (100 mg total) by mouth 2 (two) times daily. (Patient not taking: Reported on 05/08/2021)   nitroGLYCERIN (NITROSTAT) 0.4 MG SL tablet Place 1 tablet (0.4 mg total) under the tongue every 5 (five) minutes as needed.   ONETOUCH VERIO test strip USE DAILY TO CHECK YOUR BLOOD SUGAR (E11.69) Test 1 x daily   oxyCODONE-acetaminophen (PERCOCET) 10-325 MG tablet Take 1 tablet by mouth every 6 (six) hours as needed.   rosuvastatin (CRESTOR) 20 MG tablet TAKE ONE TABLET BY MOUTH EVERYDAY AT BEDTIME   tiZANidine (ZANAFLEX) 4 MG tablet 4 mg as needed. Take 1/2 tablet as needed   TRUEplus Lancets 30G MISC 1 each by Does not apply route 2 (two) times daily.   TRULICITY 3 0000000 SOPN Inject 3 mg as directed once a week.   valACYclovir (VALTREX) 1000 MG tablet TAKE TWO TABLETS BY MOUTH twice A DAY FOR ONE DAY as needed   VASCEPA 1 g capsule TAKE TWO CAPSULES BY MOUTH EVERY MORNING and TAKE TWO TABLETS BY MOUTH EVERYDAY AT BEDTIME    No facility-administered encounter medications on file as of 05/17/2021.    Care Gaps: OPHTHALMOLOGY EXAM-Never done Hepatitis C Screening (Once) - never done TETANUS/TDAP (Every 10 Years)- never done COLONOSCOPY (Pts 45-4yr Insurance coverage will need to be confirmed) (Every 10 Years) Zoster Vaccines- Shingrix (1 of 2)- never done PNA vac Low Risk Adult (2 of 2 - PPSV23)- Last completed: Jan 20, 2015 COVID-19 Vaccine (2 - Moderna series)- Last completed: Aug 01, 2020 INFLUENZA VACCINE (Every 8 Months, August to March)- Last completed: Aug 29, 2020 Annual Wellness Exam- Due  Follow up visit with CCM Pharmacist SDonette Larryon 07/03/2021 at 2:00 PM  Star Rating Drugs: Rosuvastatin 20 mg- Last filled Last filled [facility]2021 [facility]25 [facility]- Last filled 04/21/2021 [facility] [phone]

## 2021-05-23 ENCOUNTER — Other Ambulatory Visit: Payer: Self-pay | Admitting: Family Medicine

## 2021-05-23 MED ORDER — LAMOTRIGINE 25 MG PO TABS: 25.0000 mg | ORAL_TABLET | Freq: Every day | ORAL | 2 refills | Status: DC

## 2021-05-23 MED ORDER — LAMOTRIGINE 100 MG PO TABS: 100.0000 mg | ORAL_TABLET | Freq: Every day | ORAL | 2 refills | Status: DC

## 2021-05-31 ENCOUNTER — Ambulatory Visit: Payer: Medicare Other

## 2021-05-31 DIAGNOSIS — I10 Essential (primary) hypertension: Secondary | ICD-10-CM

## 2021-05-31 DIAGNOSIS — E1169 Type 2 diabetes mellitus with other specified complication: Secondary | ICD-10-CM

## 2021-05-31 DIAGNOSIS — E781 Pure hyperglyceridemia: Secondary | ICD-10-CM

## 2021-06-01 LAB — COMPREHENSIVE METABOLIC PANEL
ALT: 14 IU/L (ref 0–32)
AST: 20 IU/L (ref 0–40)
Albumin/Globulin Ratio: 2 (ref 1.2–2.2)
Albumin: 4.3 g/dL (ref 3.7–4.7)
Alkaline Phosphatase: 66 IU/L (ref 44–121)
BUN/Creatinine Ratio: 28 (ref 12–28)
BUN: 24 mg/dL (ref 8–27)
Bilirubin Total: 0.2 mg/dL (ref 0.0–1.2)
CO2: 22 mmol/L (ref 20–29)
Calcium: 9.5 mg/dL (ref 8.7–10.3)
Chloride: 103 mmol/L (ref 96–106)
Creatinine, Ser: 0.86 mg/dL (ref 0.57–1.00)
Globulin, Total: 2.2 g/dL (ref 1.5–4.5)
Glucose: 213 mg/dL — ABNORMAL HIGH (ref 65–99)
Potassium: 3.8 mmol/L (ref 3.5–5.2)
Sodium: 142 mmol/L (ref 134–144)
Total Protein: 6.5 g/dL (ref 6.0–8.5)
eGFR: 72 mL/min/{1.73_m2} (ref >59)

## 2021-06-01 LAB — CBC WITH DIFFERENTIAL/PLATELET
Basophils Absolute: 0.1 10*3/uL (ref 0.0–0.2)
Basos: 1 %
EOS (ABSOLUTE): 0.2 10*3/uL (ref 0.0–0.4)
Eos: 2 %
Hematocrit: 43.6 % (ref 34.0–46.6)
Hemoglobin: 14.6 g/dL (ref 11.1–15.9)
Immature Grans (Abs): 0.1 10*3/uL (ref 0.0–0.1)
Immature Granulocytes: 1 %
Lymphocytes Absolute: 2.8 10*3/uL (ref 0.7–3.1)
Lymphs: 31 %
MCH: 29.2 pg (ref 26.6–33.0)
MCHC: 33.5 g/dL (ref 31.5–35.7)
MCV: 87 fL (ref 79–97)
Monocytes Absolute: 0.6 10*3/uL (ref 0.1–0.9)
Monocytes: 7 %
Neutrophils Absolute: 5.3 10*3/uL (ref 1.4–7.0)
Neutrophils: 58 %
Platelets: 262 10*3/uL (ref 150–450)
RBC: 5 x10E6/uL (ref 3.77–5.28)
RDW: 13.2 % (ref 11.7–15.4)
WBC: 9 10*3/uL (ref 3.4–10.8)

## 2021-06-01 LAB — LIPID PANEL
Chol/HDL Ratio: 3.4 ratio (ref 0.0–4.4)
Cholesterol, Total: 133 mg/dL (ref 100–199)
HDL: 39 mg/dL — ABNORMAL LOW (ref >39)
LDL Chol Calc (NIH): 67 mg/dL (ref 0–99)
Triglycerides: 155 mg/dL — ABNORMAL HIGH (ref 0–149)
VLDL Cholesterol Cal: 27 mg/dL (ref 5–40)

## 2021-06-01 LAB — CARDIOVASCULAR RISK ASSESSMENT

## 2021-06-01 LAB — HEMOGLOBIN A1C
Est. average glucose Bld gHb Est-mCnc: 148 mg/dL
Hgb A1c MFr Bld: 6.8 % — ABNORMAL HIGH (ref 4.8–5.6)

## 2021-06-02 ENCOUNTER — Ambulatory Visit (INDEPENDENT_AMBULATORY_CARE_PROVIDER_SITE_OTHER): Payer: Medicare Other | Admitting: Family Medicine

## 2021-06-02 ENCOUNTER — Other Ambulatory Visit: Payer: Self-pay

## 2021-06-02 VITALS — BP 136/74 | HR 85 | Temp 97.3°F | Ht 65.0 in | Wt 164.0 lb

## 2021-06-02 DIAGNOSIS — E782 Mixed hyperlipidemia: Secondary | ICD-10-CM | POA: Diagnosis not present

## 2021-06-02 DIAGNOSIS — E1142 Type 2 diabetes mellitus with diabetic polyneuropathy: Secondary | ICD-10-CM

## 2021-06-02 DIAGNOSIS — F17219 Nicotine dependence, cigarettes, with unspecified nicotine-induced disorders: Secondary | ICD-10-CM

## 2021-06-02 DIAGNOSIS — Z23 Encounter for immunization: Secondary | ICD-10-CM

## 2021-06-02 DIAGNOSIS — F331 Major depressive disorder, recurrent, moderate: Secondary | ICD-10-CM

## 2021-06-02 DIAGNOSIS — F332 Major depressive disorder, recurrent severe without psychotic features: Secondary | ICD-10-CM | POA: Diagnosis not present

## 2021-06-02 DIAGNOSIS — I2510 Atherosclerotic heart disease of native coronary artery without angina pectoris: Secondary | ICD-10-CM

## 2021-06-02 DIAGNOSIS — J449 Chronic obstructive pulmonary disease, unspecified: Secondary | ICD-10-CM | POA: Diagnosis not present

## 2021-06-02 DIAGNOSIS — I119 Hypertensive heart disease without heart failure: Secondary | ICD-10-CM | POA: Diagnosis not present

## 2021-06-02 DIAGNOSIS — I10 Essential (primary) hypertension: Secondary | ICD-10-CM

## 2021-06-02 DIAGNOSIS — E1169 Type 2 diabetes mellitus with other specified complication: Secondary | ICD-10-CM

## 2021-06-02 DIAGNOSIS — Z6827 Body mass index (BMI) 27.0-27.9, adult: Secondary | ICD-10-CM

## 2021-06-02 MED ORDER — TRUEPLUS LANCETS 30G MISC: 1.0000 | Freq: Two times a day (BID) | 2 refills | Status: DC

## 2021-06-02 MED ORDER — VENLAFAXINE HCL ER 75 MG PO CP24: 75.0000 mg | ORAL_CAPSULE | Freq: Every day | ORAL | 0 refills | Status: DC

## 2021-06-02 NOTE — Progress Notes (Signed)
Subjective:  Patient ID: Sonya Small, female    DOB: Nov 21, 1950  Age: 70 y.o. MRN: [id number]  Chief Complaint  Patient presents with   Depression   Hyperlipidemia   Hypertension    HPI Diabetes:  Complications: nephropathy, hyperlipidemia, CAD, polyneuropathy Glucose checking: qam Glucose logs:100-150 Hypoglycemia: none Most recent A1C: Current medications: Trulicity 3 mg weekly (on Wednesday) Last Eye Exam: request report Foot checks: none  Lifestyle changes: eats healthy. No exercise.   Hyperlipidemia: Current medications: Crestor 20 mg, Vascepa daily, fenofibrate 160 mg  Lifestyle changes: eats healthy  Hypertensive heart disease Cardiologist: dr. Mallie Darting Current medications:Lisinopril-HCTZ 20-25 mg daily. SL NTG. On plavix.   COPD: on Breo one inhalation daily. Fairly well controlled. Smoiking 1/2 ppd. Does not want to quit.   Chronic pain syndrome: managed by pain clinic. On percocet.   Anxiety/Depression: Lamictal 125 mg-makes patient sleepy would like to have Effexor in the morning and take Lamictal at night if possible. On xanax.  PHQ9 SCORE ONLY 06/04/2021 05/08/2021 02/27/2021  PHQ-9 Total Score '22 23 15    '$ Current Outpatient Medications on File Prior to Visit  Medication Sig Dispense Refill   ALPRAZolam (XANAX) 0.5 MG tablet Take 1 tablet (0.5 mg total) by mouth 3 (three) times daily. 90 tablet 0   Aspirin-Acetaminophen (GOODYS BODY PAIN PO) Take 1 packet by mouth daily.     clopidogrel (PLAVIX) 75 MG tablet TAKE ONE TABLET BY MOUTH EVERY MORNING 90 tablet 2   diclofenac Sodium (VOLTAREN) 1 % GEL      fenofibrate 160 MG tablet TAKE ONE TABLET BY MOUTH EVERY MORNING 90 tablet 2   fluticasone (FLONASE) 50 MCG/ACT nasal spray INSTILL 1 SPRAY IN EACH NOSTRIL ONCE DAILY 16 g 1   fluticasone furoate-vilanterol (BREO ELLIPTA) 200-25 MCG/INH AEPB Inhale 1 puff into the lungs daily.     Fluticasone-Salmeterol,sensor, (AIRDUO DIGIHALER) 113-14 MCG/ACT AEPB  Inhale 1 puff into the lungs in the morning and at bedtime. (Patient not taking: Reported on 05/08/2021)     glucose blood (ONETOUCH VERIO) test strip USE DAILY TO CHECK YOUR BLOOD SUGAR (E11.69) 100 each 3   lamoTRIgine (LAMICTAL) 100 MG tablet Take 1 tablet (100 mg total) by mouth daily. 30 tablet 2   lamoTRIgine (LAMICTAL) 25 MG tablet Take 1 tablet (25 mg total) by mouth daily. 30 tablet 2   lansoprazole (PREVACID) 30 MG capsule TAKE ONE CAPSULE BY MOUTH BEFORE BREAKFAST 90 capsule 2   lisinopril-hydrochlorothiazide (ZESTORETIC) 20-25 MG tablet Take 1 tablet by mouth daily. 30 tablet 3   nitroGLYCERIN (NITROSTAT) 0.4 MG SL tablet Place 1 tablet (0.4 mg total) under the tongue every 5 (five) minutes as needed. 25 tablet 1   ONETOUCH VERIO test strip USE DAILY TO CHECK YOUR BLOOD SUGAR (E11.69) Test 1 x daily 100 each 3   oxyCODONE-acetaminophen (PERCOCET) 10-325 MG tablet Take 1 tablet by mouth every 6 (six) hours as needed.     rosuvastatin (CRESTOR) 20 MG tablet TAKE ONE TABLET BY MOUTH EVERYDAY AT BEDTIME 90 tablet 2   tiZANidine (ZANAFLEX) 4 MG tablet 4 mg as needed. Take 1/2 tablet as needed     TRULICITY 3 0000000 SOPN Inject 3 mg as directed once a week. 4 mL 2   valACYclovir (VALTREX) 1000 MG tablet TAKE TWO TABLETS BY MOUTH twice A DAY FOR ONE DAY as needed 10 tablet 2   VASCEPA 1 g capsule TAKE TWO CAPSULES BY MOUTH EVERY MORNING and TAKE TWO TABLETS BY MOUTH EVERYDAY AT  BEDTIME 360 capsule 2   No current facility-administered medications on file prior to visit.   Past Medical History:  Diagnosis Date   Anxiety    CAD (coronary artery disease)    a. NSTEMI 11/2008 s/p DES to LCx (3.0x12 Xience); b. NSTEMI 01/2010 secondary to thrombotic RCA lesion (non-obstructive)-->med rx (integrilin x 24 hrs + plavix); c. 09/2012 negative Myoview.   Chronic diastolic CHF (congestive heart failure) ([place])    a. 06/2014 Echo: EF 55-60%, no rwma, Gr1 DD, mild AI.   Depression    Dizziness    Drug  induced constipation    Generalized hyperhidrosis    GERD (gastroesophageal reflux disease)    Headache    Hyperlipidemia    Hypertensive heart disease    Lumbar disc disease    Metabolic encephalopathy    Mixed hyperlipidemia    Myocardial infarction ([place])    Obstructive sleep apnea    OP (osteoporosis)    Osteoarthritis    Osteoporosis    Other malaise    Overweight(278.02)    PAD (peripheral artery disease) ([place])    a. Emboli to R foot 2010 from partially occlusive lesion in R EIA, s/p stenting. - followed by Dr. Donnetta Hutching;  b. 10/2015 ABIs: R 1.03, L 0.97.   Restless leg    Sleep apnea    Stroke [facility])    TIA (transient ischemic attack)    Tobacco abuse    Urge incontinence    Past Surgical History:  Procedure Laterality Date   CYST EXCISION Left 01/2021   left foot   EYE SURGERY     at age 62   hysterectomy -age 43     ILIAC ARTERY STENT     RIGHT ILIAC STENT   KNEE ARTHROSCOPY     LITHOTRIPSY Left 12/2020   LUMBAR LAMINECTOMY     TUBAL LIGATION      Family History  Problem Relation Age of Onset   Alzheimer's disease Mother    Hodgkin's lymphoma Brother    Lung cancer Brother    Hepatitis C Brother    Hypothyroidism Brother    Hypertension Brother    Depression Brother    Heart disease Other        Grandfather   Social History   Socioeconomic History   Marital status: Divorced    Spouse name: Not on file   Number of children: 3   Years of education: 10 th   Highest education level: Not on file  Occupational History   Occupation: DISABLED    Employer: UNEMPLOYED  Tobacco Use   Smoking status: Every Day    Packs/day: 0.50    Years: 54.00    Pack years: 27.00    Types: Cigarettes    Last attempt to quit: 11/20/2012    Years since quitting: 8.5   Smokeless tobacco: Never   Tobacco comments:    2 ppd for many years.   Substance and Sexual Activity   Alcohol use: No    Alcohol/week: 0.0 standard drinks   Drug use: No   Sexual activity: Never   Other Topics Concern   Not on file  Social History Narrative   Patient is single with 3 children, 1 deceased.   Patient is right handed.   Patient has 10 th grade education.   Patient drinks 5 or more cups daily.   Social Determinants of Health   Financial Resource Strain: Not on file  Food Insecurity: No Food Insecurity   Worried About Running  Out of Food in the Last Year: Never true   Ran Out of Food in the Last Year: Never true  Transportation Needs: Not on file  Physical Activity: Not on file  Stress: Not on file  Social Connections: Not on file    Review of Systems  Constitutional:  Negative for chills, fatigue and fever.  HENT:  Negative for congestion, ear pain, rhinorrhea and sore throat.   Respiratory:  Positive for shortness of breath. Negative for cough.   Cardiovascular:  Negative for chest pain.  Gastrointestinal:  Positive for constipation. Negative for abdominal pain, diarrhea, nausea and vomiting.  Genitourinary:  Negative for dysuria and urgency.  Musculoskeletal:  Positive for back pain. Negative for myalgias.  Neurological:  Negative for dizziness, weakness, light-headedness and headaches.  Psychiatric/Behavioral:  Negative for dysphoric mood. The patient is not nervous/anxious.     Objective:  BP 136/74   Pulse 85   Temp (!) 97.3 F (36.3 C)   Ht '5\' 5"'$  (1.651 m)   Wt 164 lb (74.4 kg)   SpO2 99%   BMI 27.29 kg/m   BP/Weight 06/02/2021 [id number]  Systolic BP [id number]  Diastolic BP 74 72 78  Wt. (Lbs) 164 162 161.4  BMI 27.29 26.96 26.86    Physical Exam Vitals reviewed.  Constitutional:      Appearance: Normal appearance. She is normal weight.  Neck:     Vascular: No carotid bruit.  Cardiovascular:     Rate and Rhythm: Normal rate and regular rhythm.     Pulses: Normal pulses.     Heart sounds: Normal heart sounds.  Pulmonary:     Effort: Pulmonary effort is normal. No respiratory distress.     Breath sounds: Normal breath  sounds.  Abdominal:     General: Abdomen is flat. Bowel sounds are normal.     Palpations: Abdomen is soft.     Tenderness: There is no abdominal tenderness.  Neurological:     Mental Status: She is alert and oriented to person, place, and time.  Psychiatric:        Behavior: Behavior normal.     Comments: Depressed affect    Diabetic Foot Exam - Simple   Simple Foot Form  06/02/2021  5:55 PM  Visual Inspection No deformities, no ulcerations, no other skin breakdown bilaterally: Yes Sensation Testing Intact to touch and monofilament testing bilaterally: Yes Pulse Check Posterior Tibialis and Dorsalis pulse intact bilaterally: Yes Comments      Lab Results  Component Value Date   WBC 9.0 05/31/2021   HGB 14.6 05/31/2021   HCT 43.6 05/31/2021   PLT 262 05/31/2021   GLUCOSE 213 (H) 05/31/2021   CHOL 133 05/31/2021   TRIG 155 (H) 05/31/2021   HDL 39 (L) 05/31/2021   LDLCALC 67 05/31/2021   ALT 14 05/31/2021   AST 20 05/31/2021   NA 142 05/31/2021   K 3.8 05/31/2021   CL 103 05/31/2021   CREATININE 0.86 05/31/2021   BUN 24 05/31/2021   CO2 22 05/31/2021   TSH 0.619 12/04/2019   INR 1.0 01/31/2019   HGBA1C 6.8 (H) 05/31/2021   MICROALBUR neg 02/27/2021      Assessment & Plan:   Problem List Items Addressed This Visit       Cardiovascular and Mediastinum   Coronary artery disease    The current medical regimen is effective;  continue present plan and medications. Management per specialist.        Hypertensive heart  disease - Primary    Well controlled.  No changes to medicines.  Continue to work on eating a healthy diet and exercise.  Labs reviewed.          Respiratory   COPD mixed type (HCC)    The current medical regimen is effective;  continue present plan and medications. Recommend quit smoking.        Endocrine   Diabetic polyneuropathy associated with type 2 diabetes mellitus ([place])    Control: good Recommend check sugars fasting  daily. Recommend check feet daily. Recommend annual eye exams. Call for report. Medicines: no changes recommended.  Continue to work on eating a healthy diet and exercise.        Relevant Medications   TRUEplus Lancets 30G MISC   venlafaxine XR (EFFEXOR XR) 75 MG 24 hr capsule     Nervous and Auditory   Cigarette nicotine dependence with nicotine-induced disorder    Recommend cessation.  Defer to later date        Other   Hyperlipidemia    The current medical regimen is effective;  continue present plan and medications. Recommend continue to work on eating healthy diet and exercise.       Severe recurrent major depression without psychotic features (HCC)    Poor control. Restart effexor xr 75 mg once daily in am.  Continue lamictal 100 mg + 25 mg daily.       Relevant Medications   venlafaxine XR (EFFEXOR XR) 75 MG 24 hr capsule   Need for immunization against influenza   Relevant Orders   Flu Vaccine QUAD High Dose(Fluad) (Completed)   BMI 27.0-27.9,adult    Recommend continue to work on eating healthy diet and exercise.          Meds ordered this encounter  Medications   TRUEplus Lancets 30G MISC    Sig: 1 each by Does not apply route 2 (two) times daily.    Dispense:  100 each    Refill:  2    E11.65 Please disregard rx for Onetouch lancets.   venlafaxine XR (EFFEXOR XR) 75 MG 24 hr capsule    Sig: Take 1 capsule (75 mg total) by mouth daily with breakfast.    Dispense:  90 capsule    Refill:  0     Orders Placed This Encounter  Procedures   Flu Vaccine QUAD High Dose(Fluad)      Follow-up: Return in about 6 weeks (around 07/14/2021) for depression fup.  An After Visit Summary was printed and given to the patient.  [name], MD [facility] [phone]

## 2021-06-02 NOTE — Patient Instructions (Signed)
Start effexor xr 75 mg once in am.  Recommend wear compression stockings.

## 2021-06-04 ENCOUNTER — Encounter: Payer: Self-pay | Admitting: Family Medicine

## 2021-06-04 DIAGNOSIS — Z6827 Body mass index (BMI) 27.0-27.9, adult: Secondary | ICD-10-CM | POA: Insufficient documentation

## 2021-06-04 DIAGNOSIS — Z23 Encounter for immunization: Secondary | ICD-10-CM | POA: Insufficient documentation

## 2021-06-04 NOTE — Assessment & Plan Note (Signed)
The current medical regimen is effective;  continue present plan and medications. Management per specialist.   

## 2021-06-04 NOTE — Assessment & Plan Note (Signed)
The current medical regimen is effective;  continue present plan and medications. Recommend continue to work on eating healthy diet and exercise.  

## 2021-06-04 NOTE — Assessment & Plan Note (Signed)
Recommend cessation.  Defer to later date

## 2021-06-04 NOTE — Assessment & Plan Note (Signed)
Well controlled.  °No changes to medicines.  °Continue to work on eating a healthy diet and exercise.  °Labs reviewed.  °

## 2021-06-04 NOTE — Assessment & Plan Note (Signed)
The current medical regimen is effective;  continue present plan and medications. Recommend quit smoking.  

## 2021-06-04 NOTE — Assessment & Plan Note (Signed)
Recommend continue to work on eating healthy diet and exercise.  

## 2021-06-04 NOTE — Assessment & Plan Note (Signed)
Control: good Recommend check sugars fasting daily. Recommend check feet daily. Recommend annual eye exams. Call for report. Medicines: no changes recommended.  Continue to work on eating a healthy diet and exercise.

## 2021-06-04 NOTE — Assessment & Plan Note (Signed)
Poor control. Restart effexor xr 75 mg once daily in am.  Continue lamictal 100 mg + 25 mg daily.

## 2021-06-06 ENCOUNTER — Telehealth: Payer: Self-pay

## 2021-06-06 ENCOUNTER — Other Ambulatory Visit: Payer: Self-pay

## 2021-06-06 DIAGNOSIS — R5383 Other fatigue: Secondary | ICD-10-CM

## 2021-06-06 DIAGNOSIS — E1142 Type 2 diabetes mellitus with diabetic polyneuropathy: Secondary | ICD-10-CM

## 2021-06-06 MED ORDER — TRUEPLUS LANCETS 30G MISC: 1.0000 | Freq: Two times a day (BID) | 2 refills | Status: DC

## 2021-06-06 NOTE — Telephone Encounter (Signed)
Patient called asking if she can decrease her lamictal from 125 mg to 100 mg reports she is too sleepy on 125 mg. Per Dr. [name] it is ok for her decrease her dose to lamictal '100mg'$  at night. Patient aware and expressed understanding.

## 2021-06-06 NOTE — Telephone Encounter (Signed)
Patient called stating [facility] did not have lancets in stock. Sent rx to [facility].

## 2021-06-07 MED ORDER — ALPRAZOLAM 0.5 MG PO TABS: 0.5000 mg | ORAL_TABLET | Freq: Three times a day (TID) | ORAL | 1 refills | Status: DC

## 2021-06-12 ENCOUNTER — Other Ambulatory Visit: Payer: Self-pay | Admitting: Physician Assistant

## 2021-06-16 ENCOUNTER — Telehealth: Payer: Self-pay

## 2021-06-16 NOTE — Chronic Care Management (AMB) (Signed)
Chronic Care Management Pharmacy Assistant   Name: Sonya Small  MRN: [id number] DOB: Feb 06, 1951  Reason for Encounter: Medication Review/ Medication Coordination Call   Recent office visits:  06/02/2021- [name], MD (PCP)- Venlafaxine 75 mg- 1 tablet daily added. Fluticasone-Salmeterol 113/14 mcg/ACT and Nitrofurantoin Monohydrate 100 mg discontinued due to patient reported not taking.   Recent consult visits:  None  Hospital visits:  None in previous 6 months  Reviewed chart for medication changes ahead of medication coordination call.  No OVs, Consults, or hospital visits since last care coordination call/Pharmacist visit.  Medication changes indicated above.  BP Readings from Last 3 Encounters:  06/02/21 136/74  03/13/21 130/72  02/27/21 124/78    Lab Results  Component Value Date   HGBA1C 6.8 (H) 05/31/2021     Patient obtains medications through Adherence Packaging  30 Days   Last adherence delivery included:  Clopidogrel 75mg - 1 tablet daily (Breakfast) Fenofibrate 160mg - 1 tablet daily (breakfast) Lamotrigine 125 mg - take 100 mg and 25 mg once daily (bedtime) Lansoprazole 30mg - 1 tablet daily (before breakfast) Lisinopril HCTZ 20-25-1 tablet daily (breakfast) Rosuvastatin 20mg - 1 tablet daily (bedtime) Trulicity 3mg /0.5 ml pens- Inject 3 mg once a week. Vascepa 1G- 2 tablets twice daily (breakfast and evening meal )   Patient declined the following medications last month: [facility], has on hand [facility] , has on hand  Patient is due for next adherence delivery on: 06/27/2021. Called patient and reviewed medications and coordinated delivery.  06/16/2021- Spoke with patient, she was not happy about new medication given, Venlafaxine 75 mg, she states she is sleepy all the time, she sleeps all night then sleeps during the day too and feels tired even after sleeping a full 8 hours. Patient states she does not want to take this  medication anymore and is ready to get off of some medications. She also informed me she is only taking the 100 mg Lamictal at night, patient was a little frustrated and was feeling down. Patient stated she was going to try 1/2 of Lamictal 100 mg tonight and not take Venafaxine 75 mg tomorrow to see how she feels. Patient also mentioned her BS was at 162 fasting this morning and all she had to eat was 2 hotdogs last night. Patient just wanted to get off all medications, expressed to patient the importance of her medications and urged not to stop them. I explained that I will contact Dr [name] office and [place] Constable, CPP regarding current issues and see what they can adjust prior to her delivery. Patient aware I will call her on Monday to see how she felt after the decrease in medication, after speaking with patient and just talking about how her and family were doing and discussing current world news and laughed patient was feeling better, she felt like she just needed to blow off some steam and she was very thankful for me calling and talking to her. Message sent to CCM team to help coordinate patient's medications.   06/23/2021- Awaiting provider and CPP response to medications, will follow up with patient.   Medications: Outpatient Encounter Medications as of 06/16/2021  Medication Sig   ALPRAZolam (XANAX) 0.5 MG tablet Take 1 tablet (0.5 mg total) by mouth 3 (three) times daily.   Aspirin-Acetaminophen (GOODYS BODY PAIN PO) Take 1 packet by mouth daily.   clopidogrel (PLAVIX) 75 MG tablet TAKE ONE TABLET BY MOUTH EVERY MORNING   diclofenac Sodium (VOLTAREN) 1 %  GEL    fenofibrate 160 MG tablet TAKE ONE CAPSULE BY MOUTH EVERY MORNING   fluticasone (FLONASE) 50 MCG/ACT nasal spray INSTILL 1 SPRAY IN EACH NOSTRIL ONCE DAILY   fluticasone furoate-vilanterol (BREO ELLIPTA) 200-25 MCG/INH AEPB Inhale 1 puff into the lungs daily.   glucose blood (ONETOUCH VERIO) test strip USE DAILY TO CHECK YOUR BLOOD  SUGAR (E11.69)   lamoTRIgine (LAMICTAL) 100 MG tablet Take 1 tablet (100 mg total) by mouth daily.   lansoprazole (PREVACID) 30 MG capsule TAKE ONE CAPSULE BY MOUTH BEFORE BREAKFAST   lisinopril-hydrochlorothiazide (ZESTORETIC) 20-25 MG tablet Take 1 tablet by mouth daily.   nitroGLYCERIN (NITROSTAT) 0.4 MG SL tablet Place 1 tablet (0.4 mg total) under the tongue every 5 (five) minutes as needed.   ONETOUCH VERIO test strip USE DAILY TO CHECK YOUR BLOOD SUGAR (E11.69) Test 1 x daily   oxyCODONE-acetaminophen (PERCOCET) 10-325 MG tablet Take 1 tablet by mouth every 6 (six) hours as needed.   rosuvastatin (CRESTOR) 20 MG tablet TAKE ONE TABLET BY MOUTH EVERYDAY AT BEDTIME   tiZANidine (ZANAFLEX) 4 MG tablet 4 mg as needed. Take 1/2 tablet as needed   TRUEplus Lancets 30G MISC 1 each by Does not apply route 2 (two) times daily.   TRULICITY 3 SH/7.72YO SOPN Inject 3 mg as directed once a week.   valACYclovir (VALTREX) 1000 MG tablet TAKE TWO TABLETS BY MOUTH twice A DAY FOR ONE DAY as needed   VASCEPA 1 g capsule TAKE TWO CAPSULES BY MOUTH TWICE DAILY   venlafaxine XR (EFFEXOR XR) 75 MG 24 hr capsule Take 1 capsule (75 mg total) by mouth daily with breakfast.   No facility-administered encounter medications on file as of 06/16/2021.    Care Gaps: OPHTHALMOLOGY EXAM-Never done Hepatitis C Screening (Once) - never done TETANUS/TDAP (Every 10 Years)- never done COLONOSCOPY (Pts 45-88yrs Insurance coverage will need to be confirmed) (Every 10 Years) Zoster Vaccines- Shingrix (1 of 2)- never done PNA vac Low Risk Adult (2 of 2 - PPSV23)- Last completed: Jan 20, 2015 COVID-19 Vaccine (2 - Moderna series)- Last completed: Aug 01, 2020 INFLUENZA VACCINE (Every 8 Months, August to March)- Last completed: Aug 29, 2020 Annual Wellness Exam- Due- Sent request to CCM team [name], LPN    Star Rating Drugs: Rosuvastatin 20 mg- Last filled 05/23/2021 for 30 DS from [facility]. Lisinopril HCTZ  20/25 mg- Last filled 05/23/2021 for 30 DS from [facility]. Trulicity- Last filled 3/78/5885 for 28 DS from [facility].  Pattricia Boss, [place] Pharmacist Assistant [phone]

## 2021-06-27 LAB — HM DIABETES EYE EXAM

## 2021-07-03 ENCOUNTER — Other Ambulatory Visit: Payer: Self-pay

## 2021-07-03 ENCOUNTER — Ambulatory Visit (INDEPENDENT_AMBULATORY_CARE_PROVIDER_SITE_OTHER): Payer: Medicare Other

## 2021-07-03 DIAGNOSIS — E782 Mixed hyperlipidemia: Secondary | ICD-10-CM

## 2021-07-03 DIAGNOSIS — I119 Hypertensive heart disease without heart failure: Secondary | ICD-10-CM

## 2021-07-03 DIAGNOSIS — E1142 Type 2 diabetes mellitus with diabetic polyneuropathy: Secondary | ICD-10-CM

## 2021-07-03 DIAGNOSIS — E1169 Type 2 diabetes mellitus with other specified complication: Secondary | ICD-10-CM

## 2021-07-03 DIAGNOSIS — I10 Essential (primary) hypertension: Secondary | ICD-10-CM

## 2021-07-03 DIAGNOSIS — J449 Chronic obstructive pulmonary disease, unspecified: Secondary | ICD-10-CM

## 2021-07-03 NOTE — Progress Notes (Signed)
Chronic Care Management Pharmacy Note  07/03/2021 Name:  Sonya Small MRN:  [id number] DOB:  [birth year]   Plan Recommendations:  Anxiety is worsened. Her brother is diagnosed with terminal cancer and under hospice care. Was given 5 months to live and October is month 5. Spent the majority of the visit talking about her mental health and she doesn't feel prepared for him to leave  Subjective: Sonya Small is an 70 y.o. year old female who is a primary patient of [name], [name], MD.  The CCM team was consulted for assistance with disease management and care coordination needs.    Engaged with patient by telephone for follow up visit in response to provider referral for pharmacy case management and/or care coordination services.   Consent to Services:  The patient was given information about Chronic Care Management services, agreed to services, and gave verbal consent prior to initiation of services.  Please see initial visit note for detailed documentation.   Patient Care Team: [name], MD as PCP - General (Family Medicine) [name], [name], MD as PCP - Cardiology (Cardiology) Lane Hacker, [facility] (Pharmacist)  Recent office visits: 03/13/2021 - macrobid bid for cystitis.  02/27/2021 - check if taking lamotrigine and tamsulosin. Change lisinopril/hctz 20/25 mg daily due to spilling protein in urine.  11/25/2020 - Patient had left when UA came back. We tried to reach her all morning to get a ct urogram. She called back at 11:30 approximately. Offered to order ct urogram today or treat her empirically with flomax 0.4 mg one twice a day and LOTS of fluids over the weekend. If she worsens, she can go to the ED. If no better by Monday,call and will order ct urogram.  10/27/2020 - lamotrigine 100 mg daily instead of abilify.   Recent consult visits: 03/30/2021 - podiatry - 6 weeks follow-up after ganglion cyst removal.  02/28/2021 - podiatry - 2 week follow-up after left foot surgery.   02/21/2021 - podiatry - post-op. 02/07/2021 - podiatry - schedule removal of ganglion cyst.  01/17/2021 - podiatry - diagnosis of ganglion cyst. Requested PCP records to proceed with surgery.  12/19/2020 - cardiology - recommend stop taking good powders. BP well controlled. Recommend smoking cessation.   10/21/2020 - Cardio - recommend smoking cessation. Asymptomatic CAD.  10/19/2020 - Cardio - upper back/shoulder pain for 2 weeks. Relieve nitroglycerin advised to go to ED.   Hospital visits: 10/19/2020 - ED - ruled out cardiac cause.   Objective:  Lab Results  Component Value Date   CREATININE 0.86 05/31/2021   BUN 24 05/31/2021   GFRNONAA >60 10/19/2020   GFRAA 97 10/03/2020   NA 142 05/31/2021   K 3.8 05/31/2021   CALCIUM 9.5 05/31/2021   CO2 22 05/31/2021    Lab Results  Component Value Date/Time   HGBA1C 6.8 (H) 05/31/2021 08:19 AM   HGBA1C 6.4 (H) 02/27/2021 10:15 AM   MICROALBUR neg 02/27/2021 10:10 AM   MICROALBUR 80 11/25/2020 08:27 AM    Last diabetic Eye exam: No results found for: HMDIABEYEEXA  Last diabetic Foot exam: No results found for: HMDIABFOOTEX   Lab Results  Component Value Date   CHOL 133 05/31/2021   HDL 39 (L) 05/31/2021   LDLCALC 67 05/31/2021   TRIG 155 (H) 05/31/2021   CHOLHDL 3.4 05/31/2021    Hepatic Function Latest Ref Rng & Units 05/31/2021 03/29/2021 02/27/2021  Total Protein 6.0 - 8.5 g/dL 6.5 6.5 6.9  Albumin 3.7 - 4.7 g/dL 4.3  4.4 4.4  AST 0 - 40 IU/L _0 ALT 0 - 32 IU/L _1 Alk Phosphatase 44 - 121 IU/L 66 80 82  Total Bilirubin 0.0 - 1.2 mg/dL 0.2 <0.2 0.2  Bilirubin, Direct 0.0 - 0.2 mg/dL - - -    Lab Results  Component Value Date/Time   TSH 0.619 12/04/2019 10:20 AM   TSH 1.130 07/13/2014 01:08 AM    CBC Latest Ref Rng & Units 05/31/2021 02/27/2021 11/25/2020  WBC 3.4 - 10.8 x10E3/uL 9.0 10.9(H) 9.1  Hemoglobin 11.1 - 15.9 g/dL 14.6 15.0 14.4  Hematocrit 34.0 - 46.6 % 43.6 46.4 44.4  Platelets 150 - 450 x10E3/uL  262 302 250    No results found for: VD25OH  Clinical ASCVD: Yes  The 10-year ASCVD risk score (Arnett DK, et al., 2019) is: 42.3%   Values used to calculate the score:     Age: 68 years     Sex: Female     Is Non-Hispanic African American: No     Diabetic: Yes     Tobacco smoker: Yes     Systolic Blood Pressure: 235 mmHg     Is BP treated: Yes     HDL Cholesterol: 39 mg/dL     Total Cholesterol: 133 mg/dL    Depression screen [facility] 2/9 06/04/2021 06/02/2021 05/08/2021  Decreased Interest 3 - 3  Down, Depressed, Hopeless 3 - 3  PHQ - 2 Score 6 - 6  Altered sleeping 3 - 3  Tired, decreased energy 3 - 3  Change in appetite 3 - 3  Feeling bad or failure about yourself  3 - 2  Trouble concentrating 3 - 3  Moving slowly or fidgety/restless 1 - 3  Suicidal thoughts 0 - 0  PHQ-9 Score 22 - 23  Difficult doing work/chores Very difficult Very difficult -  Some recent data might be hidden    PHQ9 SCORE ONLY 06/04/2021 05/08/2021 02/27/2021  PHQ-9 Total Score _2 GAD 7 : Generalized Anxiety Score 05/08/2021 08/10/2020  Nervous, Anxious, on Edge 3 3  Control/stop worrying 3 3  Worry too much - different things 3 3  Trouble relaxing 3 3  Restless 1 3  Easily annoyed or irritable 3 3  Afraid - awful might happen 3 3  Total GAD 7 Score 19 21  Anxiety Difficulty - Very difficult       Social History   Tobacco Use  Smoking Status Every Day   Packs/day: 0.50   Years: 54.00   Pack years: 27.00   Types: Cigarettes   Last attempt to quit: 11/20/2012   Years since quitting: 8.6  Smokeless Tobacco Never  Tobacco Comments   2 ppd for many years.    BP Readings from Last 3 Encounters:  06/02/21 136/74  03/13/21 130/72  02/27/21 124/78   Pulse Readings from Last 3 Encounters:  06/02/21 85  03/13/21 94  02/27/21 100   Wt Readings from Last 3 Encounters:  06/02/21 164 lb (74.4 kg)  03/13/21 162 lb (73.5 kg)  02/27/21 161 lb 6.4 oz (73.2 kg)    Assessment/Interventions:  Review of patient past medical history, allergies, medications, health status, including review of consultants reports, laboratory and other test data, was performed as part of comprehensive evaluation and provision of chronic care management services.   SDOH:  (Social Determinants of Health) assessments and interventions performed: Yes SDOH Interventions    Flowsheet Row Most Recent Value  SDOH Interventions  Financial Strain Interventions Other (Comment)  [PAP]       CCM Care Plan  Allergies  Allergen Reactions   Abilify [Aripiprazole]     confusion   Latex Rash    Medications Reviewed Today     Reviewed by [name], MD (Physician) on 06/04/21 at 1759  Med List Status: <None>   Medication Order Taking? Sig Documenting Provider Last Dose Status Informant  ALPRAZolam (XANAX) 0.5 MG tablet [id number]  Take 1 tablet (0.5 mg total) by mouth 3 (three) times daily. [name], MD  Active   Aspirin-Acetaminophen (GOODYS BODY PAIN PO) [id number] No Take 1 packet by mouth daily. [provider] Taking Active   clopidogrel (PLAVIX) 75 MG tablet [id number] No TAKE ONE TABLET BY MOUTH EVERY MORNING Marge Duncans, PA-C Taking Active   diclofenac Sodium (VOLTAREN) 1 % GEL [id number] No  [provider] Taking Active   fenofibrate 160 MG tablet [id number] No TAKE ONE TABLET BY MOUTH EVERY MORNING Marge Duncans, PA-C Taking Active   fluticasone (FLONASE) 50 MCG/ACT nasal spray [id number] No INSTILL 1 SPRAY IN EACH NOSTRIL ONCE DAILY [name], [name], MD Taking Active   fluticasone furoate-vilanterol (BREO ELLIPTA) 200-25 MCG/INH AEPB [id number] No Inhale 1 puff into the lungs daily. [provider] Taking Active   glucose blood (ONETOUCH VERIO) test strip [id number] No USE DAILY TO CHECK YOUR BLOOD SUGAR (E11.69) [name], [name], MD Taking Active   lamoTRIgine (LAMICTAL) 100 MG tablet [id number]  Take 1 tablet (100 mg total) by mouth daily. [name], [name], MD  Active   lamoTRIgine  (LAMICTAL) 25 MG tablet [id number]  Take 1 tablet (25 mg total) by mouth daily. [name], MD  Active   lansoprazole (PREVACID) 30 MG capsule [id number] No TAKE ONE CAPSULE BY MOUTH BEFORE BREAKFAST Marge Duncans, PA-C Taking Active   lisinopril-hydrochlorothiazide (ZESTORETIC) 20-25 MG tablet [id number] No Take 1 tablet by mouth daily. [name], [name], MD Taking Active   nitroGLYCERIN (NITROSTAT) 0.4 MG SL tablet [id number] No Place 1 tablet (0.4 mg total) under the tongue every 5 (five) minutes as needed. [name], MD Taking Active   [facility] VERIO test strip [id number] No USE DAILY TO CHECK YOUR BLOOD SUGAR (E11.69) Test 1 x daily [name], [name], MD Taking Active   oxyCODONE-acetaminophen (PERCOCET) 10-325 MG tablet [id number] No Take 1 tablet by mouth every 6 (six) hours as needed. [provider] Taking Active   rosuvastatin (CRESTOR) 20 MG tablet [id number] No TAKE ONE TABLET BY MOUTH EVERYDAY AT BEDTIME Marge Duncans, PA-C Taking Active   tiZANidine (ZANAFLEX) 4 MG tablet [id number] No 4 mg as needed. Take 1/2 tablet as needed [provider] Taking Active   TRUEplus Lancets 30G [place] [id number]  1 each by Does not apply route 2 (two) times daily. [name], MD  Active   TRULICITY 3 RX/5.4MG [place] Aid [id number] No Inject 3 mg as directed once a week. Marge Duncans, PA-C Taking Active   valACYclovir (VALTREX) 1000 MG tablet [id number] No TAKE TWO TABLETS BY MOUTH twice A DAY FOR ONE DAY as needed [name], Elnita Maxwell, MD Taking Active   VASCEPA 1 g capsule [id number] No TAKE TWO CAPSULES BY MOUTH EVERY MORNING and TAKE TWO TABLETS BY MOUTH EVERYDAY AT BEDTIME Marge Duncans, PA-C Taking Active   venlafaxine XR (EFFEXOR XR) 75 MG 24 hr capsule [id number] Yes Take 1 capsule (75 mg total) by mouth daily with breakfast. [name], MD  Active             Patient Active Problem  List   Diagnosis Date Noted   Need for immunization against influenza 06/04/2021   BMI 27.0-27.9,adult 06/04/2021   Severe  recurrent major depression without psychotic features ([place]) 05/09/2020   Dizziness 02/07/2020   Mixed stress and urge urinary incontinence 12/25/2019   Localized edema 12/18/2019   Dry mouth 12/18/2019   Diabetic polyneuropathy associated with type 2 diabetes mellitus ([place]) 12/16/2019   Depression, major, recurrent, moderate (HCC) 12/03/2019   Cigarette nicotine dependence with nicotine-induced disorder 12/03/2019   Hypertensive heart disease    Pituitary adenoma ([place]) 02/22/2015   Cerebral infarction due to thrombosis of left middle cerebral artery ([place]) 08/23/2014   COPD mixed type ([place]) 07/13/2014   OSA (obstructive sleep apnea) 07/13/2014   Chronic diastolic heart failure ([place]) 07/13/2014   Coronary artery disease    PAD (peripheral artery disease) ([place])    Hyperlipidemia    History of myocardial infarction 01/22/2010    Immunization History  Administered Date(s) Administered   Fluad Quad(high Dose 65+) 08/29/2020, 06/02/2021   Influenza,inj,Quad PF,6+ Mos 07/13/2014   Influenza-Unspecified 05/29/2019   Moderna Sars-Covid-2 Vaccination 07/11/2020, 08/01/2020   Pneumococcal Conjugate-13 01/20/2015   Pneumococcal Polysaccharide-23 06/26/2011    Conditions to be addressed/monitored:  Hypertension, Hyperlipidemia, Diabetes, Heart Failure, Coronary Artery Disease and COPD  Care Plan : ccm pharmacy care plan  Updates made by Lane Hacker, [place] since 07/03/2021 12:00 AM     Problem: htn,hld,dm   Priority: High  Onset Date: 11/15/2020     Long-Range Goal: disease state management   Start Date: 11/15/2020  Expected End Date: 11/15/2021  Recent Progress: On track  Priority: High  Note:   Current Barriers:  Unable to maintain control of anxiety/depression currently.   Needs testing supplies through part B benefit.   Pharmacist Clinical Goal(s):  Over the next 90 days, patient will maintain control of blood sugar  as evidenced by a1c  through collaboration with PharmD  and provider.   Interventions: 1:1 collaboration with [name], MD regarding development and update of comprehensive plan of care as evidenced by provider attestation and co-signature Inter-disciplinary care team collaboration (see longitudinal plan of care) Comprehensive medication review performed; medication list updated in electronic medical record  Hypertension (BP goal <130/80) -controlled -Current treatment: Lisinopril-hydrochlorothiazide 20/25 mg every morning -Medications previously tried: amlodipine  -Current home readings: not checking at this time.  -Current dietary habits: enjoying fruit. Cooks at home. Reports limited appetite since brother has been ill.  -Current exercise habits: minimal due to back pain and breathing  -Denies hypotensive/hypertensive symptoms -Educated on BP goals and benefits of medications for prevention of heart attack, stroke and kidney damage; Daily salt intake goal < 2300 mg; Importance of home blood pressure monitoring; -Counseled to monitor BP at home daily, document, and provide log at future appointments -Counseled on diet and exercise extensively Recommended to continue current medication  Hyperlipidemia/CAD: (LDL goal < 55) -uncontrolled -Current treatment: rosuvastatin 20 mg daily at bedtime  Fenofibrate 160 mg daily am  Plavix 75 mg daily  Vascepa 1 gram 2 capsules bid  -Medications previously tried: none reported -Current dietary patterns: cooks at home. Eats meatloaf, pinto beans, stewed potatoes, homemade biscuits -Current exercise habits: limited due to back pain and shortness of breath  -Educated on Cholesterol goals;  Benefits of statin for ASCVD risk reduction; Importance of limiting foods high in cholesterol; -Counseled on diet and exercise extensively Recommended to continue current medication.   Diabetes (A1c goal <7%) -controlled -Current medications: Onetouch Verio Trulicity 3  mg weekly  -Medications previously  tried: none reported today  -Current home glucose readings fasting glucose: 96-167 mg/dL -Denies hypoglycemic/hyperglycemic symptoms -Current meal patterns:  breakfast: fruit  Lunch and supper:  variety of meat and vegtables drinks: nature's twist, water, diet drinks -Current exercise: limited due to back pain and breathing -Educated onA1c and blood sugar goals; Complications of diabetes including kidney damage, retinal damage, and cardiovascular disease; Benefits of routine self-monitoring of blood sugar; -Counseled to check feet daily and get yearly eye exams -Counseled on diet and exercise extensively Recommended to continue current medication  Depression/Anxiety (Goal: manage symptoms) -Uncontrolled -Current treatment: Lamotrigine 100 mg daily  Alprazolam 0.5 mg tid prn  Venlafaxine XR 36m -Medications previously tried/failed: Abilify  -PHQ9:  [facility] 06/04/2021 06/02/2021 05/08/2021  Decreased Interest 3 - 3  Down, Depressed, Hopeless 3 - 3  PHQ - 2 Score 6 - 6  Altered sleeping 3 - 3  Tired, decreased energy 3 - 3  Change in appetite 3 - 3  Feeling bad or failure about yourself  3 - 2  Trouble concentrating 3 - 3  Moving slowly or fidgety/restless 1 - 3  Suicidal thoughts 0 - 0  PHQ-9 Score 22 - 23  Difficult doing work/chores Very difficult Very difficult -  Some recent data might be hidden  -GAD7:  GAD 7 : Generalized Anxiety Score 05/08/2021 08/10/2020  Nervous, Anxious, on Edge 3 3  Control/stop worrying 3 3  Worry too much - different things 3 3  Trouble relaxing 3 3  Restless 1 3  Easily annoyed or irritable 3 3  Afraid - awful might happen 3 3  Total GAD 7 Score 19 21  Anxiety Difficulty - Very difficult  -Recommended connecting with counselor or psychiatry for mental health support - patient declines and states she talks to her dog.  -Educated on Benefits of medication for symptom control October 2022: Patient stated her brother was given  5 months to live upon Cancer Dx and October is month 5. Spent the majority of the visit listening to her talk about her mental health   Osteoporosis / Osteopenia (Goal prevent fracture or broken bones) -controlled -Last DEXA Scan:  T-Score femoral neck: -2.3  T-Score lumbar spine: -2.1  10-year probability of major osteoporotic fracture: 12.4%  10-year probability of hip fracture: 2.4% -Patient is not a candidate for pharmacologic treatment -Current treatment  Diet and lifestyle -Medications previously tried: none reported  -Recommend [phone] units of vitamin D daily. Recommend 1200 mg of calcium daily from dietary and supplemental sources. Recommend weight-bearing and muscle strengthening exercises for building and maintaining bone density. -Counseled on diet and exercise extensively   Patient Goals/Self-Care Activities Over the next 90 days, patient will:  - take medications as prescribed check glucose daily, document, and provide at future appointments check blood pressure daily, document, and provide at future appointments  Follow Up Plan: Telephone follow up appointment with care management team member scheduled for: April 2022 to review cholesterol panel and repeat PHQ9/GAD7 and  monthly with pharmacy team for medication coordinatio  NArizona Constable Pharm.D. - [phone]        Medication Assistance: None required.  Patient affirms current coverage meets needs.  Patient's preferred pharmacy is:  [id number] [place], [place] [id number]: [phone]: [phone] [place] [address] [place]  [address] Phone: [phone] Fax: [phone]  [place], [place] - [place] [address] Phone: [phone] Fax: [place], [address] [place] [address] Phone: [phone] Fax: [phone]  Uses pill box? Yes Pt endorses good compliance  We discussed: Benefits of medication synchronization, packaging and delivery as well as enhanced pharmacist oversight with [facility]. Patient decided to: Utilize [facility] for medication synchronization, packaging and delivery  Care Plan and Follow Up Patient Decision:  Patient agrees to Care Plan and Follow-up.  Plan: Telephone follow up appointment with care management team member scheduled for:  April 2023 unless needed sooner. Pharmacy team will speak with patient monthly to coordinate fills.   [place] Constable, Pharm.D. - [phone]

## 2021-07-03 NOTE — Patient Instructions (Signed)
Visit Information   Goals Addressed   None    Patient Care Plan: ccm pharmacy care plan     Problem Identified: htn,hld,dm   Priority: High  Onset Date: 11/15/2020     Long-Range Goal: disease state management   Start Date: 11/15/2020  Expected End Date: 11/15/2021  Recent Progress: On track  Priority: High  Note:   Current Barriers:  Unable to maintain control of anxiety/depression currently.   Needs testing supplies through part B benefit.   Pharmacist Clinical Goal(s):  Over the next 90 days, patient will maintain control of blood sugar  as evidenced by a1c  through collaboration with PharmD and provider.   Interventions: 1:1 collaboration with [name], MD regarding development and update of comprehensive plan of care as evidenced by provider attestation and co-signature Inter-disciplinary care team collaboration (see longitudinal plan of care) Comprehensive medication review performed; medication list updated in electronic medical record  Hypertension (BP goal <130/80) -controlled -Current treatment: Lisinopril-hydrochlorothiazide 20/25 mg every morning -Medications previously tried: amlodipine  -Current home readings: not checking at this time.  -Current dietary habits: enjoying fruit. Cooks at home. Reports limited appetite since brother has been ill.  -Current exercise habits: minimal due to back pain and breathing  -Denies hypotensive/hypertensive symptoms -Educated on BP goals and benefits of medications for prevention of heart attack, stroke and kidney damage; Daily salt intake goal < 2300 mg; Importance of home blood pressure monitoring; -Counseled to monitor BP at home daily, document, and provide log at future appointments -Counseled on diet and exercise extensively Recommended to continue current medication  Hyperlipidemia/CAD: (LDL goal < 55) -uncontrolled -Current treatment: rosuvastatin 20 mg daily at bedtime  Fenofibrate 160 mg daily am  Plavix  75 mg daily  Vascepa 1 gram 2 capsules bid  -Medications previously tried: none reported -Current dietary patterns: cooks at home. Eats meatloaf, pinto beans, stewed potatoes, homemade biscuits -Current exercise habits: limited due to back pain and shortness of breath  -Educated on Cholesterol goals;  Benefits of statin for ASCVD risk reduction; Importance of limiting foods high in cholesterol; -Counseled on diet and exercise extensively Recommended to continue current medication.   Diabetes (A1c goal <7%) -controlled -Current medications: Onetouch Verio Trulicity 3 mg weekly  -Medications previously tried: none reported today  -Current home glucose readings fasting glucose: 96-167 mg/dL -Denies hypoglycemic/hyperglycemic symptoms -Current meal patterns:  breakfast: fruit  Lunch and supper:  variety of meat and vegtables drinks: nature's twist, water, diet drinks -Current exercise: limited due to back pain and breathing -Educated onA1c and blood sugar goals; Complications of diabetes including kidney damage, retinal damage, and cardiovascular disease; Benefits of routine self-monitoring of blood sugar; -Counseled to check feet daily and get yearly eye exams -Counseled on diet and exercise extensively Recommended to continue current medication  Depression/Anxiety (Goal: manage symptoms) -Uncontrolled -Current treatment: Lamotrigine 100 mg daily  Alprazolam 0.5 mg tid prn  Venlafaxine XR 75mg  -Medications previously tried/failed: Abilify  -PHQ9:  Depression screen [facility] 2/9 06/04/2021 06/02/2021 05/08/2021  Decreased Interest 3 - 3  Down, Depressed, Hopeless 3 - 3  PHQ - 2 Score 6 - 6  Altered sleeping 3 - 3  Tired, decreased energy 3 - 3  Change in appetite 3 - 3  Feeling bad or failure about yourself  3 - 2  Trouble concentrating 3 - 3  Moving slowly or fidgety/restless 1 - 3  Suicidal thoughts 0 - 0  PHQ-9 Score 22 - 23  Difficult doing work/chores Very difficult Very  difficult -  Some recent data might be hidden  -GAD7:  GAD 7 : Generalized Anxiety Score 05/08/2021 08/10/2020  Nervous, Anxious, on Edge 3 3  Control/stop worrying 3 3  Worry too much - different things 3 3  Trouble relaxing 3 3  Restless 1 3  Easily annoyed or irritable 3 3  Afraid - awful might happen 3 3  Total GAD 7 Score 19 21  Anxiety Difficulty - Very difficult  -Recommended connecting with counselor or psychiatry for mental health support - patient declines and states she talks to her dog.  -Educated on Benefits of medication for symptom control October 2022: Patient stated her brother was given 5 months to live upon Cancer Dx and October is month 5. Spent the majority of the visit listening to her talk about her mental health   Osteoporosis / Osteopenia (Goal prevent fracture or broken bones) -controlled -Last DEXA Scan:  T-Score femoral neck: -2.3  T-Score lumbar spine: -2.1  10-year probability of major osteoporotic fracture: 12.4%  10-year probability of hip fracture: 2.4% -Patient is not a candidate for pharmacologic treatment -Current treatment  Diet and lifestyle -Medications previously tried: none reported  -Recommend [phone] units of vitamin D daily. Recommend 1200 mg of calcium daily from dietary and supplemental sources. Recommend weight-bearing and muscle strengthening exercises for building and maintaining bone density. -Counseled on diet and exercise extensively   Patient Goals/Self-Care Activities Over the next 90 days, patient will:  - take medications as prescribed check glucose daily, document, and provide at future appointments check blood pressure daily, document, and provide at future appointments  Follow Up Plan: Telephone follow up appointment with care management team member scheduled for: April 2022 to review cholesterol panel and repeat PHQ9/GAD7 and  monthly with pharmacy team for medication coordinatio  [place] Constable, Pharm.D. -  [phone]       The patient verbalized understanding of instructions, educational materials, and care plan provided today and declined offer to receive copy of patient instructions, educational materials, and care plan.  The pharmacy team will reach out to the patient again over the next 90 days.   Lane Hacker, [facility]

## 2021-07-12 ENCOUNTER — Other Ambulatory Visit: Payer: Self-pay

## 2021-07-12 MED ORDER — ONETOUCH VERIO VI STRP: ORAL_STRIP | 3 refills | Status: DC

## 2021-07-13 ENCOUNTER — Other Ambulatory Visit: Payer: Self-pay | Admitting: Family Medicine

## 2021-07-13 NOTE — Telephone Encounter (Signed)
Received message requesting test strips be sent to different pharmacy. Sent as requested.   Sonya Small, [place] 07/13/21 10:14 AM

## 2021-07-14 ENCOUNTER — Other Ambulatory Visit: Payer: Self-pay

## 2021-07-14 ENCOUNTER — Telehealth: Payer: Self-pay

## 2021-07-14 DIAGNOSIS — I119 Hypertensive heart disease without heart failure: Secondary | ICD-10-CM

## 2021-07-14 MED ORDER — LISINOPRIL-HYDROCHLOROTHIAZIDE 20-25 MG PO TABS: 1.0000 | ORAL_TABLET | Freq: Every day | ORAL | 3 refills | Status: DC

## 2021-07-14 MED ORDER — ONETOUCH VERIO VI STRP: ORAL_STRIP | 3 refills | Status: DC

## 2021-07-14 NOTE — Chronic Care Management (AMB) (Signed)
Chronic Care Management Pharmacy Assistant   Name: DEB LOUDIN  MRN: [id number] DOB: Feb 06, 1951   Reason for Encounter: Medication Coordination for [place]     Recent office visits:  None   Recent consult visits:  07/04/21 (Podiatry) Estella Husk DPM. Seen for Foot Pain. No med changes.  Hospital visits:  None   Medications: Outpatient Encounter Medications as of 07/14/2021  Medication Sig   ALPRAZolam (XANAX) 0.5 MG tablet Take 1 tablet (0.5 mg total) by mouth 3 (three) times daily.   Aspirin-Acetaminophen (GOODYS BODY PAIN PO) Take 1 packet by mouth daily.   clopidogrel (PLAVIX) 75 MG tablet TAKE ONE TABLET BY MOUTH EVERY MORNING   diclofenac Sodium (VOLTAREN) 1 % GEL    fenofibrate 160 MG tablet TAKE ONE CAPSULE BY MOUTH EVERY MORNING   fluticasone (FLONASE) 50 MCG/ACT nasal spray INSTILL 1 SPRAY IN EACH NOSTRIL ONCE DAILY   fluticasone furoate-vilanterol (BREO ELLIPTA) 200-25 MCG/INH AEPB Inhale 1 puff into the lungs daily.   glucose blood (ONETOUCH VERIO) test strip USE DAILY TO CHECK YOUR BLOOD SUGAR (E11.69)   lamoTRIgine (LAMICTAL) 100 MG tablet Take 1 tablet (100 mg total) by mouth daily.   lansoprazole (PREVACID) 30 MG capsule TAKE ONE CAPSULE BY MOUTH BEFORE BREAKFAST   lisinopril-hydrochlorothiazide (ZESTORETIC) 20-25 MG tablet Take 1 tablet by mouth daily.   nitroGLYCERIN (NITROSTAT) 0.4 MG SL tablet Place 1 tablet (0.4 mg total) under the tongue every 5 (five) minutes as needed.   ONETOUCH VERIO test strip USE DAILY TO CHECK YOUR BLOOD SUGAR (E11.69) Test 1 x daily   ONETOUCH VERIO test strip USE DAILY TO CHECK YOUR BLOOD SUGAR   oxyCODONE-acetaminophen (PERCOCET) 10-325 MG tablet Take 1 tablet by mouth every 6 (six) hours as needed.   rosuvastatin (CRESTOR) 20 MG tablet TAKE ONE TABLET BY MOUTH EVERYDAY AT BEDTIME   tiZANidine (ZANAFLEX) 4 MG tablet 4 mg as needed. Take 1/2 tablet as needed   TRUEplus Lancets 30G MISC 1 each by Does not apply route  2 (two) times daily.   TRULICITY 3 XB/3.5HG SOPN Inject 3 mg as directed once a week.   valACYclovir (VALTREX) 1000 MG tablet TAKE TWO TABLETS BY MOUTH twice A DAY FOR ONE DAY as needed   VASCEPA 1 g capsule TAKE TWO CAPSULES BY MOUTH TWICE DAILY   venlafaxine XR (EFFEXOR XR) 75 MG 24 hr capsule Take 1 capsule (75 mg total) by mouth daily with breakfast.   No facility-administered encounter medications on file as of 07/14/2021.   Reviewed chart for medication changes ahead of medication coordination call.  No OVs, Consults, or hospital visits since last care coordination call/Pharmacist visit. (If appropriate, list visit date, provider name)  No medication changes indicated OR if recent visit, treatment plan here.  BP Readings from Last 3 Encounters:  06/02/21 136/74  03/13/21 130/72  02/27/21 124/78    Lab Results  Component Value Date   HGBA1C 6.8 (H) 05/31/2021     Patient obtains medications through Adherence Packaging  30 Days   Last adherence delivery included:  Spoke with patient, she was not happy about new medication given, Venlafaxine 75 mg, she states she is sleepy all the time, she sleeps all night then sleeps during the day too and feels tired even after sleeping a full 8 hours. Patient states she does not want to take this medication anymore and is ready to get off of some medications. She also informed me she is only taking the 100 mg  Lamictal at night, patient was a little frustrated and was feeling down. Patient stated she was going to try 1/2 of Lamictal 100 mg tonight and not take Venafaxine 75 mg tomorrow to see how she feels. Patient also mentioned her BS was at 162 fasting this morning and all she had to eat was 2 hotdogs last night. Patient just wanted to get off all medications, expressed to patient the importance of her medications and urged not to stop them. I explained that I will contact Dr [name] office and [place] Constable, CPP regarding current issues and see what  they can adjust prior to her delivery. Patient aware I will call her on Monday to see how she felt after the decrease in medication, after speaking with patient and just talking about how her and family were doing and discussing current world news and laughed patient was feeling better, she felt like she just needed to blow off some steam and she was very thankful for me calling and talking to her. Message sent to CCM team to help coordinate patient's medications.   Patient declined (meds) last month due  [facility], has on hand Lancets-[facility] , has on hand   Patient is due for next adherence delivery on: 07/26/21.  Called patient and reviewed medications and coordinated delivery.  This delivery to include: Lansoprazole 30 mg 1 at  BB Vascepa 1 gm 2 at B 2 at EM Rosuvastatin 20 mg 1 at BT Clopidogrel 75 mg 1 at B Fenofibrate 160 mg 1 at B Lamotrigine 25 mg 1 at BT Lisinopril-HCTZ 47-42 mg 1 at B Trulicity 3 mg Inject 3 mg weekly  One touch Verio test strips Breo Ellipta Inhaler 200-25 mcg Valtrex 1000 mg two tablets twice a day for 1 day. Wants in bottles   Pt declined the following meds this month:  Lamotrigine 100 mg 1 at BT- She stated she took herself off this and only takes the 25 mg and does not want the 100 mg  Needs refills on : Lisinopril-HCTZ 20-25 mg -Sent Provider a refill request  Breo Ellipta Inhaler-Pt will call provider to send [place] a new RX for this  One Touch Verio Test Strips- Sent provider a refill request. Pt requested these to be filled through [place].    Other meds on list not included in delivery: Xanax 0.5 mg 3 times daily - Gets [facility] Lancets- Has enough supply Tizanidine 0.5 mg 2 times daily- Does not need Flonase Nasal Spray- Plenty of supply Valtrex- wants in bottle  Nitroglycerin 0.4- Has enough  Goody's Body Pain - Does not need  Diclofenac Sodium Gel 1% - Does not want  Oxycodone-Acet 10-325 mg - [facility]    Confirmed  delivery date of 07/26/21, advised patient that pharmacy will contact them the morning of delivery.   [name], [place] Pharmacist Assistant  [phone]

## 2021-07-19 ENCOUNTER — Other Ambulatory Visit: Payer: Self-pay

## 2021-07-19 MED ORDER — VALACYCLOVIR HCL 1 G PO TABS: ORAL_TABLET | ORAL | 2 refills | Status: DC

## 2021-07-21 NOTE — Progress Notes (Signed)
Cardiology Office Note    Date:  07/31/2021   ID:  Sonya Small, Sonya Small [birth year], MRN [id number]  PCP:  Sonya Brome, MD  Cardiologist:  Dr. [name]  Chief Complaint  Patient presents with   Coronary Artery Disease    History of Present Illness:  Sonya Small is a 70 y.o. female with PMH of HTN, HLD, OSA, PAD, CVA, chronic diastolic heart failure and CAD.  Patient had a NSTEMI in March 2010 and underwent DES to left circumflex.  She had another NSTEMI in May 2011 secondary to thrombotic RCA lesion, cardiac catheterization showed nonobstructive disease, medical therapy was recommended.  She was admitted for recurrent chest pain in January 2014, Myoview was negative.  She was admitted for acute CVA in October 2015 after presenting with slurred speech and facial drooping.  MRI showed acute right MCA infarct involving basal ganglia and periventricular white matter.  She was started on aspirin along with Plavix.  Myoview obtained in August 2017 showed no ischemia, normal EF.   ABI obtained in July 2018 was normal.    She was seen by Sonya Deforest PA-C on 03/13/2018  with chest pain.  A Myoview study on 03/25/2018 which showed EF 52%, small sized moderate intensity fixed apical perfusion defect likely attenuation artifact was seen, no reversible ischemia otherwise.  Overall this is a low risk stress test.  Seen again on 05/22/2018, he was having a lot of dizziness. She was noted to be taking different doses of medication than what was prescribed. BP was stable. Polypharmacy with use of Xanax, oxycodone, Tizanidiine, Zoloft. Dizziness felt to be more related to medication than to a vascular issue. Carotid dopplers were checked and were OK.  She was admitted in May 2020 with PNA and respiratory failure requiring ventilator support. Treated with antibiotics and breathing treatments with improvement. Still smoking.  She was seen in the ED on 10/19/20 with acute mid thoracic pain. Started left scapula and moved to  the right. She states Ntg did help. Troponin was normal x 2. Ecg showed mild ST depression in leads V5-6. CXR normal. Other labs normal. Pain lasted about 30 minutes.  Since then her pain has resolved.    On follow up today she notes that she has had pain across her shoulder blades and upper back for several months. It is worse with activity. She does get relief with pain medication and Ntg. Has numbness in her fingers.  She is still smoking.  She still complains of neuropathy in her feet. Has sciatica. Notes difficulty staying awake. Has been under increased stress since her brother has been diagnosed with lung CA.  Prior LE dopplers in January showed no significant PAD.     Past Medical History:  Diagnosis Date   Anxiety    CAD (coronary artery disease)    a. NSTEMI 11/2008 s/p DES to LCx (3.0x12 Xience); b. NSTEMI 01/2010 secondary to thrombotic RCA lesion (non-obstructive)-->med rx (integrilin x 24 hrs + plavix); c. 09/2012 negative Myoview.   Chronic diastolic CHF (congestive heart failure) ( [place])    a. 06/2014 Echo: EF 55-60%, no rwma, Gr1 DD, mild AI.   Depression    Dizziness    Drug induced constipation    Ganglion cyst of left foot 01/17/2021   Generalized hyperhidrosis    GERD (gastroesophageal reflux disease)    Headache    Hyperlipidemia    Hypertensive heart disease    Lumbar disc disease    Metabolic encephalopathy  Mixed hyperlipidemia    Myocardial infarction ([place])    Obstructive sleep apnea    OP (osteoporosis)    Osteoarthritis    Osteoporosis    Other malaise    Overweight(278.02)    PAD (peripheral artery disease) ([place])    a. Emboli to R foot 2010 from partially occlusive lesion in R EIA, s/p stenting. - followed by Dr. Donnetta Hutching;  b. 10/2015 ABIs: R 1.03, L 0.97.   Restless leg    Sleep apnea    Stroke [facility])    TIA (transient ischemic attack)    Tobacco abuse    Urge incontinence     Past Surgical History:  Procedure Laterality Date   CYST EXCISION Left  01/2021   left foot   EYE SURGERY     at age 1   hysterectomy -age 50     ILIAC ARTERY STENT     RIGHT ILIAC STENT   KNEE ARTHROSCOPY     LITHOTRIPSY Left 12/2020   LUMBAR LAMINECTOMY     TUBAL LIGATION      Current Medications: Outpatient Medications Prior to Visit  Medication Sig Dispense Refill   ALPRAZolam (XANAX) 0.5 MG tablet Take 1 tablet (0.5 mg total) by mouth 3 (three) times daily. 90 tablet 2   Aspirin-Acetaminophen (GOODYS BODY PAIN PO) Take 1 packet by mouth daily.     clopidogrel (PLAVIX) 75 MG tablet TAKE ONE TABLET BY MOUTH EVERY MORNING 90 tablet 2   diclofenac Sodium (VOLTAREN) 1 % GEL      fenofibrate 160 MG tablet TAKE ONE CAPSULE BY MOUTH EVERY MORNING 90 tablet 2   fluticasone (FLONASE) 50 MCG/ACT nasal spray INSTILL 1 SPRAY IN EACH NOSTRIL ONCE DAILY 16 g 1   fluticasone furoate-vilanterol (BREO ELLIPTA) 200-25 MCG/INH AEPB Inhale 1 puff into the lungs daily.     lamoTRIgine (LAMICTAL) 25 MG tablet Take 25 mg by mouth at bedtime.     lansoprazole (PREVACID) 30 MG capsule TAKE ONE CAPSULE BY MOUTH BEFORE BREAKFAST 90 capsule 2   lisinopril-hydrochlorothiazide (ZESTORETIC) 20-25 MG tablet Take 1 tablet by mouth daily. 30 tablet 3   nitroGLYCERIN (NITROSTAT) 0.4 MG SL tablet Place 1 tablet (0.4 mg total) under the tongue every 5 (five) minutes as needed. 25 tablet 1   ONETOUCH VERIO test strip USE DAILY TO CHECK YOUR BLOOD SUGAR (E11.69) Test 1 x daily 100 each 3   oxyCODONE-acetaminophen (PERCOCET) 10-325 MG tablet Take 1 tablet by mouth every 6 (six) hours as needed.     rosuvastatin (CRESTOR) 20 MG tablet TAKE ONE TABLET BY MOUTH EVERYDAY AT BEDTIME 90 tablet 2   tiZANidine (ZANAFLEX) 4 MG tablet 4 mg as needed. Take 1/2 tablet as needed     TRUEplus Lancets 30G MISC 1 each by Does not apply route 2 (two) times daily. 782 each 2   TRULICITY 3 NF/6.2ZH SOPN Inject 3 mg as directed once a week. 4 mL 2   valACYclovir (VALTREX) 1000 MG tablet TAKE TWO TABLETS BY  MOUTH twice A DAY FOR ONE DAY as needed 10 tablet 2   VASCEPA 1 g capsule TAKE TWO CAPSULES BY MOUTH TWICE DAILY 360 capsule 2   venlafaxine XR (EFFEXOR XR) 75 MG 24 hr capsule Take 1 capsule (75 mg total) by mouth daily with breakfast. 90 capsule 1   lamoTRIgine (LAMICTAL) 100 MG tablet Take 1 tablet (100 mg total) by mouth daily. (Patient not taking: Reported on 07/31/2021) 30 tablet 2   No facility-administered medications prior to  visit.     Allergies:   Abilify [aripiprazole] and Latex   Social History   Socioeconomic History   Marital status: Divorced    Spouse name: Not on file   Number of children: 3   Years of education: 10 th   Highest education level: Not on file  Occupational History   Occupation: DISABLED    Employer: UNEMPLOYED  Tobacco Use   Smoking status: Every Day    Packs/day: 0.50    Years: 54.00    Pack years: 27.00    Types: Cigarettes    Last attempt to quit: 11/20/2012    Years since quitting: 8.6   Smokeless tobacco: Never   Tobacco comments:    2 ppd for many years.   Substance and Sexual Activity   Alcohol use: No    Alcohol/week: 0.0 standard drinks   Drug use: No   Sexual activity: Never  Other Topics Concern   Not on file  Social History Narrative   Patient is single with 3 children, 1 deceased.   Patient is right handed.   Patient has 10 th grade education.   Patient drinks 5 or more cups daily.   Social Determinants of Health   Financial Resource Strain: High Risk   Difficulty of Paying Living Expenses: Hard  Food Insecurity: No Food Insecurity   Worried About Charity fundraiser in the Last Year: Never true   Ran Out of Food in the Last Year: Never true  Transportation Needs: Not on file  Physical Activity: Not on file  Stress: Not on file  Social Connections: Not on file     Family History:  The patient's family history includes Alzheimer's disease in her mother; Depression in her brother; Heart disease in an other family  member; Hepatitis C in her brother; Hodgkin's lymphoma in her brother; Hypertension in her brother; Hypothyroidism in her brother; Lung cancer in her brother.   ROS:   Please see the history of present illness.    ROS All other systems reviewed and are negative.   PHYSICAL EXAM:   VS:  BP 105/62   Pulse 75   Ht 5\' 5"  (1.651 m)   Wt 163 lb 3.2 oz (74 kg)   SpO2 98%   BMI 27.16 kg/m    GEN: Well nourished, well developed, in no acute distress  HEENT: normal  Neck: no JVD, carotid bruits, or masses Cardiac: RRR; no murmurs, rubs, or gallops,no edema  Respiratory:  clear to auscultation bilaterally, normal work of breathing GI: soft, nontender, nondistended, + BS MS: no deformity or atrophy  Skin: warm and dry, no rash Neuro:  Alert and Oriented x 3, Strength and sensation are intact Psych: euthymic mood, full affect  Wt Readings from Last 3 Encounters:  07/31/21 163 lb 3.2 oz (74 kg)  07/25/21 162 lb 3.2 oz (73.6 kg)  06/02/21 164 lb (74.4 kg)      Studies/Labs Reviewed:   EKG:  EKG is ordered today. NSR rate 75, old septal infarct. No change from prior. I have personally reviewed and interpreted this study.    Recent Labs: 05/31/2021: ALT 14; BUN 24; Creatinine, Ser 0.86; Hemoglobin 14.6; Platelets 262; Potassium 3.8; Sodium 142   Lipid Panel    Component Value Date/Time   CHOL 133 05/31/2021 0819   TRIG 155 (H) 05/31/2021 0819   HDL 39 (L) 05/31/2021 0819   CHOLHDL 3.4 05/31/2021 0819   CHOLHDL 5.0 07/13/2014 0108   VLDL 42 (H) 07/13/2014  [place] 05/31/2021 0819   Labs dated 06/17/18: CBC normal except for elevated WBC 13.3.  Creatinine 1.14. Potassium 5.5.  Dated 05/23/18: cholesterol 142, triglycerides 212, HDL 35, LDL 65.  Dated 07/29/18: Normal chemistries and CBC.  Dated 09/02/19: cholesterol 165, triglycerides 146, HDL 49, LDL 62. A1c 7%. Creatinine 0.56. potassium 3.3. CBC and TSH normal.  Additional studies/ records that were reviewed today  include:   Myoview 03/25/2018 The left ventricular ejection fraction is mildly decreased (45-54%). Nuclear stress EF: 52%. No T wave inversion was noted during stress. There was no ST segment deviation noted during stress. Defect 1: There is a small defect of moderate severity. This is a low risk study.   Small size, moderate intensity fixed apical perfusion defect, likely attenuation artifact. No reversible ischemia. LVEF 52% with normal wall motion. This is a low risk study.    ASSESSMENT:    1. Coronary artery disease involving native coronary artery of native heart with angina pectoris ([place])   2. Tobacco abuse   3. Hyperlipidemia, unspecified hyperlipidemia type   4. Essential hypertension      PLAN:  In order of problems listed above:  CAD: remote stent of mid LCx in 2010 with 3.0 x 12 DES. In 2011 had NSTEMI related to thrombotic event in distal RCA- nonobstructive. Myoview in August 2019 was low risk.  Now with pain in scapulae and mid thorax. Unclear if this is anginal versus musculoskeletal. Continue aspirin and Plavix. Will update a Lexiscan myoview. If unchanged will continue medical therapy. If changed will need cardiac cath.  Hypertension: Blood pressure is well controlled.  Carotid artery disease: She has a history of mild carotid artery disease.  Chronic diastolic heart failure: Appears to be euvolemic on physical exam. Continue lasix 20 mg daily  5.   Dyslipidemia. On Crestor and Vascepa. Excellent control. LDL 67.   6.   Tobacco abuse. Recommend smoking cessation.     Medication Adjustments/Labs and Tests Ordered: Current medicines are reviewed at length with the patient today.  Concerns regarding medicines are outlined above.  Medication changes, Labs and Tests ordered today are listed in the Patient Instructions below. There are no Patient Instructions on file for this visit.    Signed, Trinity Hyland [name], MD  07/31/2021 9:56 AM    [facility] [place], [place],   [address] Phone: [phone]; Fax: [phone]

## 2021-07-24 DIAGNOSIS — I119 Hypertensive heart disease without heart failure: Secondary | ICD-10-CM | POA: Diagnosis not present

## 2021-07-24 DIAGNOSIS — E1142 Type 2 diabetes mellitus with diabetic polyneuropathy: Secondary | ICD-10-CM

## 2021-07-24 DIAGNOSIS — I10 Essential (primary) hypertension: Secondary | ICD-10-CM

## 2021-07-24 DIAGNOSIS — E782 Mixed hyperlipidemia: Secondary | ICD-10-CM | POA: Diagnosis not present

## 2021-07-24 DIAGNOSIS — E1169 Type 2 diabetes mellitus with other specified complication: Secondary | ICD-10-CM

## 2021-07-24 DIAGNOSIS — E781 Pure hyperglyceridemia: Secondary | ICD-10-CM

## 2021-07-24 DIAGNOSIS — J449 Chronic obstructive pulmonary disease, unspecified: Secondary | ICD-10-CM

## 2021-07-25 ENCOUNTER — Ambulatory Visit (INDEPENDENT_AMBULATORY_CARE_PROVIDER_SITE_OTHER): Payer: Medicare Other | Admitting: Family Medicine

## 2021-07-25 ENCOUNTER — Other Ambulatory Visit: Payer: Self-pay

## 2021-07-25 VITALS — BP 110/70 | HR 84 | Temp 97.5°F | Resp 16 | Ht 65.0 in | Wt 162.2 lb

## 2021-07-25 DIAGNOSIS — M5432 Sciatica, left side: Secondary | ICD-10-CM | POA: Diagnosis not present

## 2021-07-25 DIAGNOSIS — R5383 Other fatigue: Secondary | ICD-10-CM

## 2021-07-25 DIAGNOSIS — F332 Major depressive disorder, recurrent severe without psychotic features: Secondary | ICD-10-CM

## 2021-07-25 DIAGNOSIS — M79672 Pain in left foot: Secondary | ICD-10-CM | POA: Diagnosis not present

## 2021-07-25 MED ORDER — VENLAFAXINE HCL ER 75 MG PO CP24: 75.0000 mg | ORAL_CAPSULE | Freq: Every day | ORAL | 1 refills | Status: DC

## 2021-07-25 MED ORDER — ALPRAZOLAM 0.5 MG PO TABS: 0.5000 mg | ORAL_TABLET | Freq: Three times a day (TID) | ORAL | 2 refills | Status: DC

## 2021-07-25 NOTE — Patient Instructions (Signed)
Recommend lamictal 50 mg once daily at night.

## 2021-07-25 NOTE — Progress Notes (Signed)
Subjective:  Patient ID: Sonya Small, female    DOB: [birth year]  Age: 70 y.o. MRN: [id number]  Chief Complaint  Patient presents with   Depression    6 week follow up   HPI: Complaining of left hip/back pain radiating to her toe. Aches. Worse with walking.  Has pain on top of left foot. Dr. Gretta Arab recommended warm, wet heat. Not helping. Sugars little up.   Depression: unable to lamictal at 100 mg a day as it makes her sleepy. She would like to try lamictal 50 mg qhs.  Depression        Associated symptoms include fatigue, myalgias and headaches.  Associated symptoms include no appetite change.   PHQ9 SCORE ONLY 07/27/2021 06/04/2021 05/08/2021  PHQ-9 Total Score 19 22 23       Current Outpatient Medications on File Prior to Visit  Medication Sig Dispense Refill   Aspirin-Acetaminophen (GOODYS BODY PAIN PO) Take 1 packet by mouth daily.     clopidogrel (PLAVIX) 75 MG tablet TAKE ONE TABLET BY MOUTH EVERY MORNING 90 tablet 2   diclofenac Sodium (VOLTAREN) 1 % GEL      fenofibrate 160 MG tablet TAKE ONE CAPSULE BY MOUTH EVERY MORNING 90 tablet 2   fluticasone (FLONASE) 50 MCG/ACT nasal spray INSTILL 1 SPRAY IN EACH NOSTRIL ONCE DAILY 16 g 1   fluticasone furoate-vilanterol (BREO ELLIPTA) 200-25 MCG/INH AEPB Inhale 1 puff into the lungs daily.     lamoTRIgine (LAMICTAL) 100 MG tablet Take 1 tablet (100 mg total) by mouth daily. (Patient taking differently: Take 25 mg by mouth daily.) 30 tablet 2   lansoprazole (PREVACID) 30 MG capsule TAKE ONE CAPSULE BY MOUTH BEFORE BREAKFAST 90 capsule 2   lisinopril-hydrochlorothiazide (ZESTORETIC) 20-25 MG tablet Take 1 tablet by mouth daily. 30 tablet 3   nitroGLYCERIN (NITROSTAT) 0.4 MG SL tablet Place 1 tablet (0.4 mg total) under the tongue every 5 (five) minutes as needed. 25 tablet 1   ONETOUCH VERIO test strip USE DAILY TO CHECK YOUR BLOOD SUGAR (E11.69) Test 1 x daily 100 each 3   oxyCODONE-acetaminophen (PERCOCET) 10-325 MG tablet Take 1  tablet by mouth every 6 (six) hours as needed.     rosuvastatin (CRESTOR) 20 MG tablet TAKE ONE TABLET BY MOUTH EVERYDAY AT BEDTIME 90 tablet 2   tiZANidine (ZANAFLEX) 4 MG tablet 4 mg as needed. Take 1/2 tablet as needed     TRUEplus Lancets 30G MISC 1 each by Does not apply route 2 (two) times daily. 194 each 2   TRULICITY 3 RD/4.0CX SOPN Inject 3 mg as directed once a week. 4 mL 2   valACYclovir (VALTREX) 1000 MG tablet TAKE TWO TABLETS BY MOUTH twice A DAY FOR ONE DAY as needed 10 tablet 2   VASCEPA 1 g capsule TAKE TWO CAPSULES BY MOUTH TWICE DAILY 360 capsule 2   No current facility-administered medications on file prior to visit.   Past Medical History:  Diagnosis Date   Anxiety    CAD (coronary artery disease)    a. NSTEMI 11/2008 s/p DES to LCx (3.0x12 Xience); b. NSTEMI 01/2010 secondary to thrombotic RCA lesion (non-obstructive)-->med rx (integrilin x 24 hrs + plavix); c. 09/2012 negative Myoview.   Chronic diastolic CHF (congestive heart failure) ([place])    a. 06/2014 Echo: EF 55-60%, no rwma, Gr1 DD, mild AI.   Depression    Dizziness    Drug induced constipation    Ganglion cyst of left foot 01/17/2021   Generalized hyperhidrosis  GERD (gastroesophageal reflux disease)    Headache    Hyperlipidemia    Hypertensive heart disease    Lumbar disc disease    Metabolic encephalopathy    Mixed hyperlipidemia    Myocardial infarction ([place])    Obstructive sleep apnea    OP (osteoporosis)    Osteoarthritis    Osteoporosis    Other malaise    Overweight(278.02)    PAD (peripheral artery disease) ([place])    a. Emboli to R foot 2010 from partially occlusive lesion in R EIA, s/p stenting. - followed by Dr. Donnetta Hutching;  b. 10/2015 ABIs: R 1.03, L 0.97.   Restless leg    Sleep apnea    Stroke [facility])    TIA (transient ischemic attack)    Tobacco abuse    Urge incontinence    Past Surgical History:  Procedure Laterality Date   CYST EXCISION Left 01/2021   left foot   EYE SURGERY      at age 44   hysterectomy -age 20     ILIAC ARTERY STENT     RIGHT ILIAC STENT   KNEE ARTHROSCOPY     LITHOTRIPSY Left 12/2020   LUMBAR LAMINECTOMY     TUBAL LIGATION      Family History  Problem Relation Age of Onset   Alzheimer's disease Mother    Hodgkin's lymphoma Brother    Lung cancer Brother    Hepatitis C Brother    Hypothyroidism Brother    Hypertension Brother    Depression Brother    Heart disease Other        Grandfather   Social History   Socioeconomic History   Marital status: Divorced    Spouse name: Not on file   Number of children: 3   Years of education: 10 th   Highest education level: Not on file  Occupational History   Occupation: DISABLED    Employer: UNEMPLOYED  Tobacco Use   Smoking status: Every Day    Packs/day: 0.50    Years: 54.00    Pack years: 27.00    Types: Cigarettes    Last attempt to quit: 11/20/2012    Years since quitting: 8.6   Smokeless tobacco: Never   Tobacco comments:    2 ppd for many years.   Substance and Sexual Activity   Alcohol use: No    Alcohol/week: 0.0 standard drinks   Drug use: No   Sexual activity: Never  Other Topics Concern   Not on file  Social History Narrative   Patient is single with 3 children, 1 deceased.   Patient is right handed.   Patient has 10 th grade education.   Patient drinks 5 or more cups daily.   Social Determinants of Health   Financial Resource Strain: High Risk   Difficulty of Paying Living Expenses: Hard  Food Insecurity: No Food Insecurity   Worried About Charity fundraiser in the Last Year: Never true   Ran Out of Food in the Last Year: Never true  Transportation Needs: Not on file  Physical Activity: Not on file  Stress: Not on file  Social Connections: Not on file    Review of Systems  Constitutional:  Positive for fatigue and unexpected weight change. Negative for appetite change and fever.  HENT:  Positive for ear pain and rhinorrhea. Negative for congestion,  sinus pressure and sore throat.   Eyes:  Negative for pain.  Respiratory:  Positive for cough and shortness of breath. Negative for  chest tightness and wheezing.   Cardiovascular:  Positive for chest pain. Negative for palpitations.  Gastrointestinal:  Positive for constipation. Negative for abdominal pain, diarrhea, nausea and vomiting.  Genitourinary:  Positive for dysuria. Negative for hematuria.       Poor bladder control  Musculoskeletal:  Positive for arthralgias, back pain and myalgias. Negative for joint swelling.  Skin:  Negative for rash.  Neurological:  Positive for weakness and headaches. Negative for dizziness.  Psychiatric/Behavioral:  Positive for depression. Negative for dysphoric mood. The patient is not nervous/anxious.     Objective:  BP 110/70   Pulse 84   Temp (!) 97.5 F (36.4 C)   Resp 16   Ht 5\' 5"  (1.651 m)   Wt 162 lb 3.2 oz (73.6 kg)   BMI 26.99 kg/m   BP/Weight 07/25/2021 06/02/2021 1/44/3154  Systolic BP 008 676 195  Diastolic BP 70 74 72  Wt. (Lbs) 162.2 164 162  BMI 26.99 27.29 26.96    Physical Exam Vitals reviewed.  Constitutional:      Appearance: Normal appearance. She is normal weight.  Neck:     Vascular: No carotid bruit.  Cardiovascular:     Rate and Rhythm: Normal rate and regular rhythm.     Pulses: Normal pulses.     Heart sounds: Normal heart sounds.  Pulmonary:     Effort: Pulmonary effort is normal. No respiratory distress.     Breath sounds: Normal breath sounds.  Abdominal:     General: Abdomen is flat. Bowel sounds are normal.     Palpations: Abdomen is soft.     Tenderness: There is no abdominal tenderness.  Musculoskeletal:     Comments: Left foot tender inferior and superior to mtp  Neurological:     Mental Status: She is alert and oriented to person, place, and time.  Psychiatric:        Behavior: Behavior normal.     Comments: depressed    Diabetic Foot Exam - Simple   No data filed      Lab Results   Component Value Date   WBC 9.0 05/31/2021   HGB 14.6 05/31/2021   HCT 43.6 05/31/2021   PLT 262 05/31/2021   GLUCOSE 213 (H) 05/31/2021   CHOL 133 05/31/2021   TRIG 155 (H) 05/31/2021   HDL 39 (L) 05/31/2021   LDLCALC 67 05/31/2021   ALT 14 05/31/2021   AST 20 05/31/2021   NA 142 05/31/2021   K 3.8 05/31/2021   CL 103 05/31/2021   CREATININE 0.86 05/31/2021   BUN 24 05/31/2021   CO2 22 05/31/2021   TSH 0.619 12/04/2019   INR 1.0 01/31/2019   HGBA1C 6.8 (H) 05/31/2021   MICROALBUR neg 02/27/2021      Assessment & Plan:   Problem List Items Addressed This Visit       Nervous and Auditory   Left sided sciatica - Primary   Relevant Medications   venlafaxine XR (EFFEXOR XR) 75 MG 24 hr capsule   ALPRAZolam (XANAX) 0.5 MG tablet     Other   Severe episode of recurrent major depressive disorder, without psychotic features ([place])    Refuses counseling.  Decrease lamictal to 50 mg once daily. Continue effexor xr at current dose. Did not tolerate higher dose previously.  Denies suicidal ideation.       Relevant Medications   venlafaxine XR (EFFEXOR XR) 75 MG 24 hr capsule   ALPRAZolam (XANAX) 0.5 MG tablet   Other fatigue  Previous workups have been negative. Likely due to depression.      Relevant Medications   ALPRAZolam (XANAX) 0.5 MG tablet   Left foot pain    Recommend pt call podiatry back.     .  Meds ordered this encounter  Medications   venlafaxine XR (EFFEXOR XR) 75 MG 24 hr capsule    Sig: Take 1 capsule (75 mg total) by mouth daily with breakfast.    Dispense:  90 capsule    Refill:  1   ALPRAZolam (XANAX) 0.5 MG tablet    Sig: Take 1 tablet (0.5 mg total) by mouth 3 (three) times daily.    Dispense:  90 tablet    Refill:  2    Not to exceed 3 additional fills before 05/30/2020.    Follow-up: Return in about 6 weeks (around 09/05/2021) for chronic fasting.  An After Visit Summary was printed and given to the patient.  I,Lauren M  Auman,acting as a scribe for [name], MD.,have documented all relevant documentation on the behalf of [name], MD,as directed by  [name], MD while in the presence of [name], MD.   [name], MD [place] [phone]

## 2021-07-27 ENCOUNTER — Encounter: Payer: Self-pay | Admitting: Family Medicine

## 2021-07-27 DIAGNOSIS — R5383 Other fatigue: Secondary | ICD-10-CM | POA: Insufficient documentation

## 2021-07-27 DIAGNOSIS — M79672 Pain in left foot: Secondary | ICD-10-CM | POA: Insufficient documentation

## 2021-07-27 DIAGNOSIS — M5432 Sciatica, left side: Secondary | ICD-10-CM | POA: Insufficient documentation

## 2021-07-27 NOTE — Assessment & Plan Note (Signed)
Recommend pt call podiatry back.

## 2021-07-27 NOTE — Assessment & Plan Note (Signed)
Previous workups have been negative. Likely due to depression.

## 2021-07-27 NOTE — Assessment & Plan Note (Signed)
Refuses counseling.  Decrease lamictal to 50 mg once daily. Continue effexor xr at current dose. Did not tolerate higher dose previously.  Denies suicidal ideation.

## 2021-07-31 ENCOUNTER — Encounter: Payer: Self-pay | Admitting: Cardiology

## 2021-07-31 ENCOUNTER — Ambulatory Visit (INDEPENDENT_AMBULATORY_CARE_PROVIDER_SITE_OTHER): Payer: Medicare Other | Admitting: Cardiology

## 2021-07-31 ENCOUNTER — Other Ambulatory Visit: Payer: Self-pay

## 2021-07-31 VITALS — BP 105/62 | HR 75 | Ht 65.0 in | Wt 163.2 lb

## 2021-07-31 DIAGNOSIS — I25119 Atherosclerotic heart disease of native coronary artery with unspecified angina pectoris: Secondary | ICD-10-CM

## 2021-07-31 DIAGNOSIS — Z720 Tobacco use: Secondary | ICD-10-CM

## 2021-07-31 DIAGNOSIS — I10 Essential (primary) hypertension: Secondary | ICD-10-CM | POA: Diagnosis not present

## 2021-07-31 DIAGNOSIS — E785 Hyperlipidemia, unspecified: Secondary | ICD-10-CM | POA: Diagnosis not present

## 2021-07-31 NOTE — Patient Instructions (Signed)
Medication Instructions:  Continue same medications *If you need a refill on your cardiac medications before your next appointment, please call your pharmacy*   Lab Work: None ordered   Testing/Procedures: Schedule Lexiscan myoview   Follow-Up: At [facility], you and your health needs are our priority.  As part of our continuing mission to provide you with exceptional heart care, we have created designated Provider Care Teams.  These Care Teams include your primary Cardiologist (physician) and Advanced Practice Providers (APPs -  Physician Assistants and Nurse Practitioners) who all work together to provide you with the care you need, when you need it.  We recommend signing up for the patient portal called "MyChart".  Sign up information is provided on this After Visit Summary.  MyChart is used to connect with patients for Virtual Visits (Telemedicine).  Patients are able to view lab/test results, encounter notes, upcoming appointments, etc.  Non-urgent messages can be sent to your provider as well.   To learn more about what you can do with MyChart, go to [email or web address].      Your next appointment:  To be determined after test    The format for your next appointment: Office    Provider:  Dr.Jordan

## 2021-08-11 ENCOUNTER — Ambulatory Visit (HOSPITAL_COMMUNITY)
Admission: RE | Admit: 2021-08-11 | Payer: Medicare Other | Source: Ambulatory Visit | Attending: Cardiology | Admitting: Cardiology

## 2021-08-15 ENCOUNTER — Telehealth: Payer: Self-pay

## 2021-08-15 NOTE — Progress Notes (Signed)
Chronic Care Management Pharmacy Assistant   Name: Sonya Small  MRN: [id number] DOB: [birth year]   Reason for Encounter: Medication Coordination for [facility]     Recent office visits:  07/25/21 Sonya Brome MD. Seen for Left Sciatica. Recommend lamictal 50 mg once daily at night. No med changes.  Recent consult visits:  07/31/21 (Cardiology) [name], [name] MD. Seen for CAD. Decreased Lamotrigine 100 mg daily to 25 mg daily.   Hospital visits:  None   Medications: Outpatient Encounter Medications as of 08/15/2021  Medication Sig   ALPRAZolam (XANAX) 0.5 MG tablet Take 1 tablet (0.5 mg total) by mouth 3 (three) times daily.   Aspirin-Acetaminophen (GOODYS BODY PAIN PO) Take 1 packet by mouth daily.   clopidogrel (PLAVIX) 75 MG tablet TAKE ONE TABLET BY MOUTH EVERY MORNING   diclofenac Sodium (VOLTAREN) 1 % GEL    fenofibrate 160 MG tablet TAKE ONE CAPSULE BY MOUTH EVERY MORNING   fluticasone (FLONASE) 50 MCG/ACT nasal spray INSTILL 1 SPRAY IN EACH NOSTRIL ONCE DAILY   fluticasone furoate-vilanterol (BREO ELLIPTA) 200-25 MCG/INH AEPB Inhale 1 puff into the lungs daily.   lamoTRIgine (LAMICTAL) 25 MG tablet Take 25 mg by mouth at bedtime.   lansoprazole (PREVACID) 30 MG capsule TAKE ONE CAPSULE BY MOUTH BEFORE BREAKFAST   lisinopril-hydrochlorothiazide (ZESTORETIC) 20-25 MG tablet Take 1 tablet by mouth daily.   nitroGLYCERIN (NITROSTAT) 0.4 MG SL tablet Place 1 tablet (0.4 mg total) under the tongue every 5 (five) minutes as needed.   ONETOUCH VERIO test strip USE DAILY TO CHECK YOUR BLOOD SUGAR (E11.69) Test 1 x daily   oxyCODONE-acetaminophen (PERCOCET) 10-325 MG tablet Take 1 tablet by mouth every 6 (six) hours as needed.   rosuvastatin (CRESTOR) 20 MG tablet TAKE ONE TABLET BY MOUTH EVERYDAY AT BEDTIME   tiZANidine (ZANAFLEX) 4 MG tablet 4 mg as needed. Take 1/2 tablet as needed   TRUEplus Lancets 30G MISC 1 each by Does not apply route 2 (two) times daily.   TRULICITY 3  GY/6.5LD SOPN Inject 3 mg as directed once a week.   valACYclovir (VALTREX) 1000 MG tablet TAKE TWO TABLETS BY MOUTH twice A DAY FOR ONE DAY as needed   VASCEPA 1 g capsule TAKE TWO CAPSULES BY MOUTH TWICE DAILY   venlafaxine XR (EFFEXOR XR) 75 MG 24 hr capsule Take 1 capsule (75 mg total) by mouth daily with breakfast.   No facility-administered encounter medications on file as of 08/15/2021.   Reviewed chart for medication changes ahead of medication coordination call.  No OVs, Consults, or hospital visits since last care coordination call/Pharmacist visit. (If appropriate, list visit date, provider name)  No medication changes indicated OR if recent visit, treatment plan here.  BP Readings from Last 3 Encounters:  07/31/21 105/62  07/25/21 110/70  06/02/21 136/74    Lab Results  Component Value Date   HGBA1C 6.8 (H) 05/31/2021     Patient obtains medications through Adherence Packaging  30 Days   Last adherence delivery included:  Lansoprazole 30 mg 1 at  BB Vascepa 1 gm 2 at B 2 at EM Rosuvastatin 20 mg 1 at BT Clopidogrel 75 mg 1 at B Fenofibrate 160 mg 1 at B Lamotrigine 25 mg 1 at BT Lisinopril-HCTZ 35-70 mg 1 at B Trulicity 3 mg Inject 3 mg weekly  One touch Verio test strips Breo Ellipta Inhaler 200-25 mcg Valtrex 1000 mg two tablets twice a day for 1 day. Wants in bottles   Patient declined (  meds) last month Lamotrigine 100 mg 1 at BT- She stated she took herself off this and only takes the 25 mg and does not want the 100 mg Xanax 0.5 mg 3 times daily - Gets from [facility] Lancets- Has enough supply Tizanidine 0.5 mg 2 times daily- Does not need Flonase Nasal Spray- Plenty of supply  Nitroglycerin 0.4- Has enough  [place] Body Pain - Does not need  Diclofenac Sodium Gel 1% - Does not want  Oxycodone-Acet 10-325 mg - Gets from [facility]   Patient is due for next adherence delivery on: 08/25/21 . Called patient and reviewed medications and coordinated delivery.  This  delivery to include: Lansoprazole 30 mg 1 at  BB Vascepa 1 gm 2 at B 2 at EM Rosuvastatin 20 mg 1 at BT Clopidogrel 75 mg 1 at B Fenofibrate 160 mg 1 at B Lamotrigine 25 mg 1 at BT Lisinopril-HCTZ 14-78 mg 1 at B Trulicity 3 mg Inject 3 mg weekly  One touch Verio test strips Breo Ellipta Inhaler 100-25 mcg Ventolin Inhaler 38mcg OneTouch Verio Test Strips  Trueplus Lancets   Patient declined the following medications  Xanax 0.5 mg 3 times daily - [facility] Tizanidine 0.5 mg 2 times daily- Does not need Flonase Nasal Spray- Plenty of supply only uses as needed  Nitroglycerin 0.4- Has enough. Only uses prn  Diclofenac Sodium Gel 1% - Does not want  Oxycodone-Acet 10-325 mg - [facility]  Valtrex 1000 mg [facility]  Patient needs refills for - Sent refill request  Lamotrigine 25 mg Trueplus Lancets   Confirmed delivery date of 08/25/21, advised patient that pharmacy will contact them the morning of delivery.   [name], [place] Pharmacist Assistant  [phone]

## 2021-08-16 ENCOUNTER — Telehealth (HOSPITAL_COMMUNITY): Payer: Self-pay | Admitting: *Deleted

## 2021-08-16 NOTE — Telephone Encounter (Signed)
Updated Lamotrigine from 25mg  to 50mg  (What patient said she's taking and what Dr. Alyse Low note said). Also, [facility]lafaxine was sent to [facility] and never picked up, will ask PCP to send both those scripts to [facility] to fill next month

## 2021-08-16 NOTE — Telephone Encounter (Signed)
Close encounter 

## 2021-08-19 ENCOUNTER — Other Ambulatory Visit: Payer: Self-pay | Admitting: Family Medicine

## 2021-08-19 MED ORDER — LAMOTRIGINE 25 MG PO TABS: 50.0000 mg | ORAL_TABLET | Freq: Every day | ORAL | 1 refills | Status: DC

## 2021-08-19 MED ORDER — VENLAFAXINE HCL ER 75 MG PO CP24: 75.0000 mg | ORAL_CAPSULE | Freq: Every day | ORAL | 1 refills | Status: DC

## 2021-08-22 ENCOUNTER — Ambulatory Visit (HOSPITAL_COMMUNITY)
Admission: RE | Admit: 2021-08-22 | Discharge: 2021-08-22 | Disposition: A | Discharge status: Home / Self Care | Payer: Medicare Other | Source: Ambulatory Visit | Attending: Cardiovascular Disease | Admitting: Cardiovascular Disease

## 2021-08-22 ENCOUNTER — Other Ambulatory Visit: Payer: Self-pay

## 2021-08-22 DIAGNOSIS — Z720 Tobacco use: Secondary | ICD-10-CM | POA: Insufficient documentation

## 2021-08-22 DIAGNOSIS — I10 Essential (primary) hypertension: Secondary | ICD-10-CM | POA: Diagnosis present

## 2021-08-22 DIAGNOSIS — E785 Hyperlipidemia, unspecified: Secondary | ICD-10-CM | POA: Insufficient documentation

## 2021-08-22 DIAGNOSIS — I25119 Atherosclerotic heart disease of native coronary artery with unspecified angina pectoris: Secondary | ICD-10-CM | POA: Diagnosis present

## 2021-08-22 LAB — MYOCARDIAL PERFUSION IMAGING
LV dias vol: 93 mL (ref 46–106)
LV sys vol: 44 mL
Nuc Stress EF: 52 %
Peak HR: 88 {beats}/min
Rest HR: 75 {beats}/min
Rest Nuclear Isotope Dose: 8.7 mCi
SDS: 0
SRS: 2
SSS: 2
ST Depression (mm): 0 mm
Stress Nuclear Isotope Dose: 27.1 mCi
TID: 1.07

## 2021-08-22 MED ORDER — TECHNETIUM TC 99M TETROFOSMIN IV KIT
8.7000 | PACK | Freq: Once | INTRAVENOUS | Status: AC | PRN
  Administered 2021-08-22: 8.7 via INTRAVENOUS
  Filled 2021-08-22: qty 9

## 2021-08-22 MED ORDER — REGADENOSON 0.4 MG/5ML IV SOLN
0.4000 mg | Freq: Once | INTRAVENOUS | Status: AC
  Administered 2021-08-22: 0.4 mg via INTRAVENOUS

## 2021-08-22 MED ORDER — TECHNETIUM TC 99M TETROFOSMIN IV KIT
27.1000 | PACK | Freq: Once | INTRAVENOUS | Status: AC | PRN
  Administered 2021-08-22: 27.1 via INTRAVENOUS
  Filled 2021-08-22: qty 28

## 2021-09-06 NOTE — Progress Notes (Signed)
Subjective:  Patient ID: Sonya Small, female    DOB: 1951-01-07  Age: 70 y.o. MRN: [id number]  Chief Complaint  Patient presents with   Diabetes   Hyperlipidemia   Hypertension    Diabetes:  Complications:neuropathy Glucose checking: qd Glucose logs: 100-130s Hypoglycemia:no Most recent A1C: 6.8 Current medications: Trulicity 3 mg weekly,  Last Eye Exam:06/27/2021 Foot checks: daily  Hyperlipidemia: Current medications: vascepa, rosuvasatin.  Hypertension: Complications: CAD Current medications: lisinoprilhctz 20/25 mg once daily and plavix 75 qd.  Diet: healthy Exercise: Minimal.   Depression: out of venlafaxine for a "long" time.  Lamictal 50 mg daily at night and xanax.  Chronic pain syndrome: [facility]. Is on oxycodone and tizanidine.   COPD: on Breo one puff daily  Current Outpatient Medications on File Prior to Visit  Medication Sig Dispense Refill   ALPRAZolam (XANAX) 0.5 MG tablet Take 1 tablet (0.5 mg total) by mouth 3 (three) times daily. 90 tablet 2   Aspirin-Acetaminophen (GOODYS BODY PAIN PO) Take 1 packet by mouth daily.     clopidogrel (PLAVIX) 75 MG tablet TAKE ONE TABLET BY MOUTH EVERY MORNING 90 tablet 2   diclofenac Sodium (VOLTAREN) 1 % GEL      fenofibrate 160 MG tablet TAKE ONE CAPSULE BY MOUTH EVERY MORNING 90 tablet 2   fluticasone (FLONASE) 50 MCG/ACT nasal spray INSTILL 1 SPRAY IN EACH NOSTRIL ONCE DAILY 16 g 1   fluticasone furoate-vilanterol (BREO ELLIPTA) 200-25 MCG/INH AEPB Inhale 1 puff into the lungs daily.     lamoTRIgine (LAMICTAL) 25 MG tablet Take 2 tablets (50 mg total) by mouth at bedtime. 180 tablet 1   lansoprazole (PREVACID) 30 MG capsule TAKE ONE CAPSULE BY MOUTH BEFORE BREAKFAST 90 capsule 2   lisinopril-hydrochlorothiazide (ZESTORETIC) 20-25 MG tablet Take 1 tablet by mouth daily. 30 tablet 3   nitroGLYCERIN (NITROSTAT) 0.4 MG SL tablet Place 1 tablet (0.4 mg total) under the tongue every 5 (five) minutes as needed.  25 tablet 1   ONETOUCH VERIO test strip USE DAILY TO CHECK YOUR BLOOD SUGAR (E11.69) Test 1 x daily 100 each 3   oxyCODONE-acetaminophen (PERCOCET) 10-325 MG tablet Take 1 tablet by mouth every 6 (six) hours as needed.     rosuvastatin (CRESTOR) 20 MG tablet TAKE ONE TABLET BY MOUTH EVERYDAY AT BEDTIME 90 tablet 2   tiZANidine (ZANAFLEX) 4 MG tablet 4 mg as needed. Take 1/2 tablet as needed     TRUEplus Lancets 30G MISC 1 each by Does not apply route 2 (two) times daily. 681 each 2   TRULICITY 3 EX/5.1ZG SOPN Inject 3 mg as directed once a week. 4 mL 2   valACYclovir (VALTREX) 1000 MG tablet TAKE TWO TABLETS BY MOUTH twice A DAY FOR ONE DAY as needed 10 tablet 2   VASCEPA 1 g capsule TAKE TWO CAPSULES BY MOUTH TWICE DAILY 360 capsule 2   No current facility-administered medications on file prior to visit.   Past Medical History:  Diagnosis Date   Anxiety    CAD (coronary artery disease)    a. NSTEMI 11/2008 s/p DES to LCx (3.0x12 Xience); b. NSTEMI 01/2010 secondary to thrombotic RCA lesion (non-obstructive)-->med rx (integrilin x 24 hrs + plavix); c. 09/2012 negative Myoview.   Chronic diastolic CHF (congestive heart failure) ([place])    a. 06/2014 Echo: EF 55-60%, no rwma, Gr1 DD, mild AI.   Depression    Dizziness    Drug induced constipation    Ganglion cyst of left  foot 01/17/2021   Generalized hyperhidrosis    GERD (gastroesophageal reflux disease)    Headache    Hyperlipidemia    Hypertensive heart disease    Lumbar disc disease    Metabolic encephalopathy    Mixed hyperlipidemia    Myocardial infarction ([place])    Obstructive sleep apnea    OP (osteoporosis)    Osteoarthritis    Osteoporosis    Other malaise    Overweight(278.02)    PAD (peripheral artery disease) ([place])    a. Emboli to R foot 2010 from partially occlusive lesion in R EIA, s/p stenting. - followed by Dr. Donnetta Hutching;  b. 10/2015 ABIs: R 1.03, L 0.97.   Restless leg    Sleep apnea    Stroke [facility])    TIA (transient  ischemic attack)    Tobacco abuse    Urge incontinence    Past Surgical History:  Procedure Laterality Date   CYST EXCISION Left 01/2021   left foot   EYE SURGERY     at age 8   hysterectomy -age 61     ILIAC ARTERY STENT     RIGHT ILIAC STENT   KNEE ARTHROSCOPY     LITHOTRIPSY Left 12/2020   LUMBAR LAMINECTOMY     TUBAL LIGATION      Family History  Problem Relation Age of Onset   Alzheimer's disease Mother    Hodgkin's lymphoma Brother    Lung cancer Brother    Hepatitis C Brother    Hypothyroidism Brother    Hypertension Brother    Depression Brother    Heart disease Other        Grandfather   Social History   Socioeconomic History   Marital status: Divorced    Spouse name: Not on file   Number of children: 3   Years of education: 10 th   Highest education level: Not on file  Occupational History   Occupation: DISABLED    Employer: UNEMPLOYED  Tobacco Use   Smoking status: Every Day    Packs/day: 0.50    Years: 54.00    Pack years: 27.00    Types: Cigarettes    Last attempt to quit: 11/20/2012    Years since quitting: 8.8   Smokeless tobacco: Never   Tobacco comments:    2 ppd for many years.   Substance and Sexual Activity   Alcohol use: No    Alcohol/week: 0.0 standard drinks   Drug use: No   Sexual activity: Never  Other Topics Concern   Not on file  Social History Narrative   Patient is single with 3 children, 1 deceased.   Patient is right handed.   Patient has 10 th grade education.   Patient drinks 5 or more cups daily.   Social Determinants of Health   Financial Resource Strain: High Risk   Difficulty of Paying Living Expenses: Hard  Food Insecurity: Not on file  Transportation Needs: Not on file  Physical Activity: Not on file  Stress: Not on file  Social Connections: Not on file    Review of Systems  Constitutional:  Negative for appetite change, fatigue and fever.  HENT:  Negative for congestion, ear pain, sinus pressure and  sore throat.   Eyes:  Negative for pain.  Respiratory:  Positive for cough. Negative for chest tightness, shortness of breath and wheezing.   Cardiovascular:  Positive for palpitations. Negative for chest pain.  Gastrointestinal:  Positive for constipation. Negative for abdominal pain, diarrhea, nausea and  vomiting.  Genitourinary:  Positive for dysuria. Negative for hematuria.  Musculoskeletal:  Positive for back pain and myalgias. Negative for arthralgias and joint swelling.  Skin:  Negative for rash.  Neurological:  Positive for dizziness and headaches. Negative for weakness.  Psychiatric/Behavioral:  Positive for dysphoric mood. The patient is not nervous/anxious.     Objective:  BP 136/70    Pulse 84    Temp 98.6 F (37 C)    Resp 18    Ht 5\' 5"  (1.651 m)    Wt 159 lb 9.6 oz (72.4 kg)    BMI 26.56 kg/m   BP/Weight 09/07/2021 08/22/2021 81/04/5630  Systolic BP 497 - 026  Diastolic BP 70 - 62  Wt. (Lbs) 159.6 163 163.2  BMI 26.56 27.12 27.16    Physical Exam Vitals reviewed.  Constitutional:      Appearance: Normal appearance. She is normal weight.  Neck:     Vascular: No carotid bruit.  Cardiovascular:     Rate and Rhythm: Normal rate and regular rhythm.     Pulses: Normal pulses.     Heart sounds: Normal heart sounds.  Pulmonary:     Effort: Pulmonary effort is normal. No respiratory distress.     Breath sounds: Normal breath sounds.  Abdominal:     General: Abdomen is flat. Bowel sounds are normal.     Palpations: Abdomen is soft.     Tenderness: There is no abdominal tenderness.  Neurological:     Mental Status: She is alert and oriented to person, place, and time.  Psychiatric:        Mood and Affect: Mood normal.        Behavior: Behavior normal.    Diabetic Foot Exam - Simple   Simple Foot Form  09/07/2021 10:49 AM  Visual Inspection No deformities, no ulcerations, no other skin breakdown bilaterally: Yes Sensation Testing Intact to touch and  monofilament testing bilaterally: Yes Pulse Check Posterior Tibialis and Dorsalis pulse intact bilaterally: Yes Comments      Lab Results  Component Value Date   WBC 8.6 09/07/2021   HGB 14.8 09/07/2021   HCT 44.5 09/07/2021   PLT 274 09/07/2021   GLUCOSE 133 (H) 09/07/2021   CHOL 134 09/07/2021   TRIG 178 (H) 09/07/2021   HDL 35 (L) 09/07/2021   LDLCALC 69 09/07/2021   ALT 14 09/07/2021   AST 21 09/07/2021   NA 142 09/07/2021   K 3.7 09/07/2021   CL 103 09/07/2021   CREATININE 0.72 09/07/2021   BUN 22 09/07/2021   CO2 23 09/07/2021   TSH 0.619 12/04/2019   INR 1.0 01/31/2019   HGBA1C 6.6 (H) 09/07/2021   MICROALBUR neg 02/27/2021      Assessment & Plan:   Problem List Items Addressed This Visit       Cardiovascular and Mediastinum   Coronary artery disease    Keep follow up with cardiology regularly.       Hypertensive heart disease - Primary    The current medical regimen is effective;  continue present plan and medications.       Relevant Orders   CBC with Differential/Platelet (Completed)   Comprehensive metabolic panel (Completed)     Respiratory   COPD mixed type ([place])    The current medical regimen is effective;  continue present plan and medications. Recommend quit smoking.         Endocrine   Diabetic polyneuropathy associated with type 2 diabetes mellitus ([place])  Control: good Recommend check sugars fasting daily. Recommend check feet daily. Recommend annual eye exams. Medicines: no changes Continue to work on eating a healthy diet and exercise.  Labs drawn today.         Relevant Orders   Hemoglobin A1c (Completed)     Nervous and Auditory   Cigarette nicotine dependence with nicotine-induced disorder    Recommend cessation. Refused.         Genitourinary   Acute cystitis without hematuria    Start cipro      Relevant Orders   POCT URINALYSIS DIP (CLINITEK) (Completed)   Urine Culture (Completed)     Other    Hyperlipidemia    Well controlled.  No changes to medicines.  Continue to work on eating a healthy diet and exercise.  Labs drawn today.        Relevant Orders   Lipid panel (Completed)   Severe episode of recurrent major depressive disorder, without psychotic features ([place])    Genetic testing.  Pt has been on numerous antidepressants. Hope to find a better treatment for her.       Bipolar II disorder major depressive with atypical features ([place])    Await genetic test.      .  Meds ordered this encounter  Medications   ciprofloxacin (CIPRO) 250 MG tablet    Sig: Take 1 tablet (250 mg total) by mouth 2 (two) times daily for 7 days.    Dispense:  14 tablet    Refill:  0     Orders Placed This Encounter  Procedures   Urine Culture   CBC with Differential/Platelet   Comprehensive metabolic panel   Hemoglobin A1c   Lipid panel   POCT URINALYSIS DIP (CLINITEK)      Follow-up: Return in about 6 weeks (around 10/19/2021) for depression.  An After Visit Summary was printed and given to the patient.    I,Lauren M Auman,acting as a scribe for [name], MD.,have documented all relevant documentation on the behalf of [name], MD,as directed by  [name], MD while in the presence of [name], MD.   [name], MD [place] [phone]

## 2021-09-07 ENCOUNTER — Encounter: Payer: Self-pay | Admitting: Family Medicine

## 2021-09-07 ENCOUNTER — Ambulatory Visit (INDEPENDENT_AMBULATORY_CARE_PROVIDER_SITE_OTHER): Payer: Medicare Other | Admitting: Family Medicine

## 2021-09-07 ENCOUNTER — Other Ambulatory Visit: Payer: Self-pay

## 2021-09-07 VITALS — BP 136/70 | HR 84 | Temp 98.6°F | Resp 18 | Ht 65.0 in | Wt 159.6 lb

## 2021-09-07 DIAGNOSIS — N3000 Acute cystitis without hematuria: Secondary | ICD-10-CM | POA: Diagnosis not present

## 2021-09-07 DIAGNOSIS — F332 Major depressive disorder, recurrent severe without psychotic features: Secondary | ICD-10-CM | POA: Diagnosis not present

## 2021-09-07 DIAGNOSIS — E782 Mixed hyperlipidemia: Secondary | ICD-10-CM

## 2021-09-07 DIAGNOSIS — J449 Chronic obstructive pulmonary disease, unspecified: Secondary | ICD-10-CM

## 2021-09-07 DIAGNOSIS — I119 Hypertensive heart disease without heart failure: Secondary | ICD-10-CM | POA: Diagnosis not present

## 2021-09-07 DIAGNOSIS — F3181 Bipolar II disorder: Secondary | ICD-10-CM

## 2021-09-07 DIAGNOSIS — F17219 Nicotine dependence, cigarettes, with unspecified nicotine-induced disorders: Secondary | ICD-10-CM

## 2021-09-07 DIAGNOSIS — E1142 Type 2 diabetes mellitus with diabetic polyneuropathy: Secondary | ICD-10-CM

## 2021-09-07 DIAGNOSIS — I2510 Atherosclerotic heart disease of native coronary artery without angina pectoris: Secondary | ICD-10-CM

## 2021-09-07 LAB — POCT URINALYSIS DIP (CLINITEK)
Bilirubin, UA: NEGATIVE
Blood, UA: NEGATIVE
Glucose, UA: NEGATIVE mg/dL
Ketones, POC UA: NEGATIVE mg/dL
Nitrite, UA: NEGATIVE
Spec Grav, UA: 1.025 (ref 1.010–1.025)
Urobilinogen, UA: 0.2 E.U./dL
pH, UA: 6 (ref 5.0–8.0)

## 2021-09-07 MED ORDER — CIPROFLOXACIN HCL 250 MG PO TABS: 250.0000 mg | ORAL_TABLET | Freq: Two times a day (BID) | ORAL | 0 refills | Status: AC

## 2021-09-08 ENCOUNTER — Other Ambulatory Visit: Payer: Self-pay | Admitting: Family Medicine

## 2021-09-08 LAB — COMPREHENSIVE METABOLIC PANEL
ALT: 14 IU/L (ref 0–32)
AST: 21 IU/L (ref 0–40)
Albumin/Globulin Ratio: 1.9 (ref 1.2–2.2)
Albumin: 4.3 g/dL (ref 3.7–4.7)
Alkaline Phosphatase: 70 IU/L (ref 44–121)
BUN/Creatinine Ratio: 31 — ABNORMAL HIGH (ref 12–28)
BUN: 22 mg/dL (ref 8–27)
Bilirubin Total: <0.2 mg/dL (ref 0.0–1.2)
CO2: 23 mmol/L (ref 20–29)
Calcium: 9.4 mg/dL (ref 8.7–10.3)
Chloride: 103 mmol/L (ref 96–106)
Creatinine, Ser: 0.72 mg/dL (ref 0.57–1.00)
Globulin, Total: 2.3 g/dL (ref 1.5–4.5)
Glucose: 133 mg/dL — ABNORMAL HIGH (ref 70–99)
Potassium: 3.7 mmol/L (ref 3.5–5.2)
Sodium: 142 mmol/L (ref 134–144)
Total Protein: 6.6 g/dL (ref 6.0–8.5)
eGFR: 89 mL/min/{1.73_m2} (ref >59)

## 2021-09-08 LAB — CBC WITH DIFFERENTIAL/PLATELET
Basophils Absolute: 0.1 10*3/uL (ref 0.0–0.2)
Basos: 1 %
EOS (ABSOLUTE): 0.1 10*3/uL (ref 0.0–0.4)
Eos: 1 %
Hematocrit: 44.5 % (ref 34.0–46.6)
Hemoglobin: 14.8 g/dL (ref 11.1–15.9)
Immature Grans (Abs): 0.1 10*3/uL (ref 0.0–0.1)
Immature Granulocytes: 1 %
Lymphocytes Absolute: 2.3 10*3/uL (ref 0.7–3.1)
Lymphs: 27 %
MCH: 29.1 pg (ref 26.6–33.0)
MCHC: 33.3 g/dL (ref 31.5–35.7)
MCV: 88 fL (ref 79–97)
Monocytes Absolute: 0.7 10*3/uL (ref 0.1–0.9)
Monocytes: 8 %
Neutrophils Absolute: 5.4 10*3/uL (ref 1.4–7.0)
Neutrophils: 62 %
Platelets: 274 10*3/uL (ref 150–450)
RBC: 5.08 x10E6/uL (ref 3.77–5.28)
RDW: 12.3 % (ref 11.7–15.4)
WBC: 8.6 10*3/uL (ref 3.4–10.8)

## 2021-09-08 LAB — LIPID PANEL
Chol/HDL Ratio: 3.8 ratio (ref 0.0–4.4)
Cholesterol, Total: 134 mg/dL (ref 100–199)
HDL: 35 mg/dL — ABNORMAL LOW (ref >39)
LDL Chol Calc (NIH): 69 mg/dL (ref 0–99)
Triglycerides: 178 mg/dL — ABNORMAL HIGH (ref 0–149)
VLDL Cholesterol Cal: 30 mg/dL (ref 5–40)

## 2021-09-08 LAB — HEMOGLOBIN A1C
Est. average glucose Bld gHb Est-mCnc: 143 mg/dL
Hgb A1c MFr Bld: 6.6 % — ABNORMAL HIGH (ref 4.8–5.6)

## 2021-09-08 MED ORDER — NITROFURANTOIN MONOHYD MACRO 100 MG PO CAPS: 100.0000 mg | ORAL_CAPSULE | Freq: Two times a day (BID) | ORAL | 0 refills | Status: DC

## 2021-09-08 NOTE — Progress Notes (Signed)
Blood count normal.  Liver function normal.  Kidney function normal.  Cholesterol: triglycerides little up, otherwise good.  HBA1C: diabetes great.  I spoke with pt about results. She is concerned about cipro interactions with prevacid. I reassured her,but she prefers to try a different antibiotic. I sent macrobid.

## 2021-09-12 LAB — URINE CULTURE

## 2021-09-13 ENCOUNTER — Telehealth: Payer: Self-pay

## 2021-09-13 NOTE — Progress Notes (Signed)
Chronic Care Management Pharmacy Assistant   Name: Sonya Small  MRN: [id number] DOB: September 12, 1951  Reason for Encounter: Medication Coordination for [facility]    Recent office visits:  09/08/21 [name] MD. Orders Only. D/C Venlafaxine XR 75 mg due to being ineffective. Ordered Macrobid 100 mg two times daily for 7 days.   09/07/21 [name] MD. Seen for Hypertensive heart disease. Started Ciprofloxacin HCI 250 mg 2 times daily.   Recent consult visits:  None   Hospital visits:  None  Medications: Outpatient Encounter Medications as of 09/13/2021  Medication Sig   ALPRAZolam (XANAX) 0.5 MG tablet Take 1 tablet (0.5 mg total) by mouth 3 (three) times daily.   Aspirin-Acetaminophen (GOODYS BODY PAIN PO) Take 1 packet by mouth daily.   ciprofloxacin (CIPRO) 250 MG tablet Take 1 tablet (250 mg total) by mouth 2 (two) times daily for 7 days.   clopidogrel (PLAVIX) 75 MG tablet TAKE ONE TABLET BY MOUTH EVERY MORNING   diclofenac Sodium (VOLTAREN) 1 % GEL    fenofibrate 160 MG tablet TAKE ONE CAPSULE BY MOUTH EVERY MORNING   fluticasone (FLONASE) 50 MCG/ACT nasal spray INSTILL 1 SPRAY IN EACH NOSTRIL ONCE DAILY   fluticasone furoate-vilanterol (BREO ELLIPTA) 200-25 MCG/INH AEPB Inhale 1 puff into the lungs daily.   lamoTRIgine (LAMICTAL) 25 MG tablet Take 2 tablets (50 mg total) by mouth at bedtime.   lansoprazole (PREVACID) 30 MG capsule TAKE ONE CAPSULE BY MOUTH BEFORE BREAKFAST   lisinopril-hydrochlorothiazide (ZESTORETIC) 20-25 MG tablet Take 1 tablet by mouth daily.   nitrofurantoin, macrocrystal-monohydrate, (MACROBID) 100 MG capsule Take 1 capsule (100 mg total) by mouth 2 (two) times daily.   nitroGLYCERIN (NITROSTAT) 0.4 MG SL tablet Place 1 tablet (0.4 mg total) under the tongue every 5 (five) minutes as needed.   ONETOUCH VERIO test strip USE DAILY TO CHECK YOUR BLOOD SUGAR (E11.69) Test 1 x daily   oxyCODONE-acetaminophen (PERCOCET) 10-325 MG tablet Take 1 tablet by  mouth every 6 (six) hours as needed.   rosuvastatin (CRESTOR) 20 MG tablet TAKE ONE TABLET BY MOUTH EVERYDAY AT BEDTIME   tiZANidine (ZANAFLEX) 4 MG tablet 4 mg as needed. Take 1/2 tablet as needed   TRUEplus Lancets 30G MISC 1 each by Does not apply route 2 (two) times daily.   TRULICITY 3 BW/4.6KZ SOPN Inject 3 mg as directed once a week.   valACYclovir (VALTREX) 1000 MG tablet TAKE TWO TABLETS BY MOUTH twice A DAY FOR ONE DAY as needed   VASCEPA 1 g capsule TAKE TWO CAPSULES BY MOUTH TWICE DAILY   No facility-administered encounter medications on file as of 09/13/2021.   Reviewed chart for medication changes ahead of medication coordination call.  No OVs, Consults, or hospital visits since last care coordination call/Pharmacist visit. (If appropriate, list visit date, provider name)  No medication changes indicated OR if recent visit, treatment plan here.  BP Readings from Last 3 Encounters:  09/07/21 136/70  07/31/21 105/62  07/25/21 110/70    Lab Results  Component Value Date   HGBA1C 6.6 (H) 09/07/2021     Patient obtains medications through [facility]herence Packaging  30 Days   Last adherence delivery included:  Lansoprazole 30 mg 1 at  BB Vascepa 1 gm 2 at B 2 at EM Rosuvastatin 20 mg 1 at BT Clopidogrel 75 mg 1 at B Fenofibrate 160 mg 1 at B Lamotrigine 25 mg 1 at BT Lisinopril-HCTZ 99-35 mg 1 at B Trulicity 3 mg Inject 3 mg  weekly  One touch Verio test strips Breo Ellipta Inhaler 100-25 mcg Ventolin Inhaler 66mcg OneTouch Verio Test Strips  Trueplus Lancets   Patient declined (meds) last month  Xanax 0.5 mg 3 times daily - [facility] Tizanidine 0.5 mg 2 times daily- Does not need Flonase Nasal Spray- Plenty of supply only uses as needed  Nitroglycerin 0.4- Has enough. Only uses prn  Diclofenac Sodium Gel 1% - Does not want  Oxycodone-Acet 10-325 mg - [facility]  Valtrex 1000 mg [facility]  Patient is due for next adherence delivery on: 09/25/21. Called  patient and reviewed medications and coordinated delivery.  This delivery to include: Lansoprazole 30 mg 1 at  BB Vascepa 1 gm 2 at B 2 at EM Rosuvastatin 20 mg 1 at BT Clopidogrel 75 mg 1 at B Fenofibrate 160 mg 1 at B Lamotrigine 25 mg 2 at BT Lisinopril-HCTZ 56-97 mg 1 at B Trulicity 3 mg Inject 3 mg weekly on Wednesday  Breo Ellipta Inhaler 100-25 mcg Ventolin Inhaler 36mcg OneTouch Verio Test Strips  Trueplus Lancets 30G  Valtrex 1000 mg two tablets for a day   Patient declined the following medications  Venlafaxine ER 75 mg- D/C by provider due to being ineffective  Xanax 0.5 mg 3 times daily - [facility] Tizanidine 0.5 mg 2 times daily- Does not need [facility]  Flonase Nasal Spray- Plenty of supply only uses as needed  Nitroglycerin 0.4- Has enough. Only uses prn  Diclofenac Sodium Gel 1% - Does not want   Patient needs refills for None   Confirmed delivery date of 09/25/21, advised patient that pharmacy will contact them the morning of delivery.  [name], [place] Pharmacist Assistant  [phone]

## 2021-09-24 DIAGNOSIS — F3181 Bipolar II disorder: Secondary | ICD-10-CM | POA: Insufficient documentation

## 2021-09-24 DIAGNOSIS — N3000 Acute cystitis without hematuria: Secondary | ICD-10-CM | POA: Insufficient documentation

## 2021-09-24 DIAGNOSIS — F332 Major depressive disorder, recurrent severe without psychotic features: Secondary | ICD-10-CM | POA: Insufficient documentation

## 2021-09-24 NOTE — Assessment & Plan Note (Addendum)
The current medical regimen is effective;  continue present plan and medications. Recommend quit smoking.  

## 2021-09-24 NOTE — Assessment & Plan Note (Signed)
Start cipro

## 2021-09-24 NOTE — Assessment & Plan Note (Signed)
Keep follow up with cardiology regularly.

## 2021-09-24 NOTE — Assessment & Plan Note (Signed)
Recommend cessation. Refused.

## 2021-09-24 NOTE — Assessment & Plan Note (Signed)
Await genetic test.

## 2021-09-24 NOTE — Assessment & Plan Note (Signed)
Control: good Recommend check sugars fasting daily. Recommend check feet daily. Recommend annual eye exams. Medicines: no changes Continue to work on eating a healthy diet and exercise.  Labs drawn today.    

## 2021-09-24 NOTE — Assessment & Plan Note (Signed)
Well controlled.  ?No changes to medicines.  ?Continue to work on eating a healthy diet and exercise.  ?Labs drawn today.  ?

## 2021-09-24 NOTE — Assessment & Plan Note (Signed)
The current medical regimen is effective;  continue present plan and medications.  

## 2021-09-24 NOTE — Assessment & Plan Note (Signed)
Genetic testing.  Pt has been on numerous antidepressants. Hope to find a better treatment for her.

## 2021-09-27 ENCOUNTER — Telehealth: Payer: Self-pay

## 2021-09-27 ENCOUNTER — Other Ambulatory Visit: Payer: Self-pay | Admitting: Family Medicine

## 2021-09-27 DIAGNOSIS — E1142 Type 2 diabetes mellitus with diabetic polyneuropathy: Secondary | ICD-10-CM

## 2021-09-27 MED ORDER — TRUEPLUS LANCETS 30G MISC: 1.0000 | Freq: Two times a day (BID) | 3 refills | Status: DC

## 2021-09-27 MED ORDER — ONETOUCH VERIO VI STRP: ORAL_STRIP | 3 refills | Status: DC

## 2021-09-27 NOTE — Telephone Encounter (Signed)
Patient confused about Trulicity delivery. Called [facility] and confirmed it was out and should be delivered around 1700 today  Also, [facility] is unable to fill TS/Lancets, coordinated with PCP to get them [facility] to [facility] to be picked up first thing tomorrow

## 2021-09-27 NOTE — Progress Notes (Signed)
° ° °  Chronic Care Management Pharmacy Assistant   Name: Sonya Small  MRN: [id number] DOB: [birth year]   Reason for Encounter: Called patient left message concerning her Trulicity. Will call back later.        Medications: Outpatient Encounter Medications as of 09/27/2021  Medication Sig   ALPRAZolam (XANAX) 0.5 MG tablet Take 1 tablet (0.5 mg total) by mouth 3 (three) times daily.   Aspirin-Acetaminophen (GOODYS BODY PAIN PO) Take 1 packet by mouth daily.   clopidogrel (PLAVIX) 75 MG tablet TAKE ONE TABLET BY MOUTH EVERY MORNING   diclofenac Sodium (VOLTAREN) 1 % GEL    fenofibrate 160 MG tablet TAKE ONE CAPSULE BY MOUTH EVERY MORNING   fluticasone (FLONASE) 50 MCG/ACT nasal spray INSTILL 1 SPRAY IN EACH NOSTRIL ONCE DAILY   fluticasone furoate-vilanterol (BREO ELLIPTA) 200-25 MCG/INH AEPB Inhale 1 puff into the lungs daily.   lamoTRIgine (LAMICTAL) 25 MG tablet Take 2 tablets (50 mg total) by mouth at bedtime.   lansoprazole (PREVACID) 30 MG capsule TAKE ONE CAPSULE BY MOUTH BEFORE BREAKFAST   lisinopril-hydrochlorothiazide (ZESTORETIC) 20-25 MG tablet Take 1 tablet by mouth daily.   nitrofurantoin, macrocrystal-monohydrate, (MACROBID) 100 MG capsule Take 1 capsule (100 mg total) by mouth 2 (two) times daily.   nitroGLYCERIN (NITROSTAT) 0.4 MG SL tablet Place 1 tablet (0.4 mg total) under the tongue every 5 (five) minutes as needed.   ONETOUCH VERIO test strip USE DAILY TO CHECK YOUR BLOOD SUGAR (E11.69) Test 1 x daily   oxyCODONE-acetaminophen (PERCOCET) 10-325 MG tablet Take 1 tablet by mouth every 6 (six) hours as needed.   rosuvastatin (CRESTOR) 20 MG tablet TAKE ONE TABLET BY MOUTH EVERYDAY AT BEDTIME   tiZANidine (ZANAFLEX) 4 MG tablet 4 mg as needed. Take 1/2 tablet as needed   TRUEplus Lancets 30G MISC 1 each by Does not apply route 2 (two) times daily.   TRULICITY 3 JX/9.1YN SOPN Inject 3 mg as directed once a week.   valACYclovir (VALTREX) 1000 MG tablet TAKE TWO TABLETS BY  MOUTH twice A DAY FOR ONE DAY as needed   VASCEPA 1 g capsule TAKE TWO CAPSULES BY MOUTH TWICE DAILY   No facility-administered encounter medications on file as of 09/27/2021.    [place] Pharmacist Assistant [phone]

## 2021-10-03 ENCOUNTER — Telehealth: Payer: Self-pay

## 2021-10-03 ENCOUNTER — Other Ambulatory Visit: Payer: Self-pay

## 2021-10-03 DIAGNOSIS — E1142 Type 2 diabetes mellitus with diabetic polyneuropathy: Secondary | ICD-10-CM

## 2021-10-03 MED ORDER — ONETOUCH VERIO VI STRP: ORAL_STRIP | 3 refills | Status: DC

## 2021-10-03 MED ORDER — TRUEPLUS LANCETS 30G MISC: 1.0000 | Freq: Two times a day (BID) | 3 refills | Status: DC

## 2021-10-03 NOTE — Progress Notes (Signed)
° ° ° ° ° °  10/03/2021 Called Sonya Small 05/22/2022 left message to return call.

## 2021-10-05 ENCOUNTER — Other Ambulatory Visit: Payer: Self-pay | Admitting: Family Medicine

## 2021-10-09 ENCOUNTER — Other Ambulatory Visit: Payer: Self-pay

## 2021-10-09 DIAGNOSIS — E1142 Type 2 diabetes mellitus with diabetic polyneuropathy: Secondary | ICD-10-CM

## 2021-10-09 MED ORDER — ONETOUCH VERIO VI STRP: ORAL_STRIP | 3 refills | Status: DC

## 2021-10-09 NOTE — Telephone Encounter (Signed)
Patient left VM that pharmacy requesting with dx code. Having troubles. Resent.   Sonya Small, [place] 10/09/21 1:48 PM

## 2021-10-13 ENCOUNTER — Telehealth: Payer: Self-pay

## 2021-10-13 NOTE — Progress Notes (Signed)
Chronic Care Management Pharmacy Assistant   Name: Sonya Small  MRN: [id number] DOB: 15-Jul-1951  Reason for Encounter: Medication Coordination for [facility]    Recent office visits:  None  Recent consult visits:  None  Hospital visits:  None  Medications: Outpatient Encounter Medications as of 10/13/2021  Medication Sig   ALPRAZolam (XANAX) 0.5 MG tablet Take 1 tablet (0.5 mg total) by mouth 3 (three) times daily.   Aspirin-Acetaminophen (GOODYS BODY PAIN PO) Take 1 packet by mouth daily.   clopidogrel (PLAVIX) 75 MG tablet TAKE ONE TABLET BY MOUTH EVERY MORNING   diclofenac Sodium (VOLTAREN) 1 % GEL    fenofibrate 160 MG tablet TAKE ONE CAPSULE BY MOUTH EVERY MORNING   fluticasone (FLONASE) 50 MCG/ACT nasal spray INSTILL 1 SPRAY IN EACH NOSTRIL ONCE DAILY   fluticasone furoate-vilanterol (BREO ELLIPTA) 200-25 MCG/INH AEPB Inhale 1 puff into the lungs daily.   glucose blood (ONETOUCH VERIO) test strip Use as instructed   lamoTRIgine (LAMICTAL) 25 MG tablet Take 2 tablets (50 mg total) by mouth at bedtime.   lansoprazole (PREVACID) 30 MG capsule TAKE ONE CAPSULE BY MOUTH BEFORE BREAKFAST   lisinopril-hydrochlorothiazide (ZESTORETIC) 20-25 MG tablet Take 1 tablet by mouth daily.   nitrofurantoin, macrocrystal-monohydrate, (MACROBID) 100 MG capsule Take 1 capsule (100 mg total) by mouth 2 (two) times daily.   nitroGLYCERIN (NITROSTAT) 0.4 MG SL tablet Place 1 tablet (0.4 mg total) under the tongue every 5 (five) minutes as needed.   oxyCODONE-acetaminophen (PERCOCET) 10-325 MG tablet Take 1 tablet by mouth every 6 (six) hours as needed.   rosuvastatin (CRESTOR) 20 MG tablet TAKE ONE TABLET BY MOUTH EVERYDAY AT BEDTIME   tiZANidine (ZANAFLEX) 4 MG tablet 4 mg as needed. Take 1/2 tablet as needed   TRUEplus Lancets 30G MISC 1 each by Does not apply route 2 (two) times daily. Q75.91   TRULICITY 3 MB/8.4YK SOPN Inject 3 mg as directed once a week.   valACYclovir (VALTREX) 1000 MG  tablet TAKE TWO TABLETS BY MOUTH twice A DAY FOR ONE DAY as needed   VASCEPA 1 g capsule TAKE TWO CAPSULES BY MOUTH TWICE DAILY   No facility-administered encounter medications on file as of 10/13/2021.   Reviewed chart for medication changes ahead of medication coordination call.  No OVs, Consults, or hospital visits since last care coordination call/Pharmacist visit.   No medication changes indicated OR if recent visit, treatment plan here.  BP Readings from Last 3 Encounters:  09/07/21 136/70  07/31/21 105/62  07/25/21 110/70    Lab Results  Component Value Date   HGBA1C 6.6 (H) 09/07/2021     Patient obtains medications through Adherence Packaging  30 Days   Last adherence delivery included:  Lansoprazole 30 mg 1 at  BB Vascepa 1 gm 2 at B 2 at EM Rosuvastatin 20 mg 1 at BT Clopidogrel 75 mg 1 at B Fenofibrate 160 mg 1 at B Lamotrigine 25 mg 2 at BT Lisinopril-HCTZ 59-93 mg 1 at B Trulicity 3 mg Inject 3 mg weekly on Wednesday  Breo Ellipta Inhaler 100-25 mcg Ventolin Inhaler 53mcg OneTouch Verio Test Strips  Trueplus Lancets 30G  Valtrex 1000 mg two tablets for a day   Patient declined (meds) last month  Venlafaxine ER 75 mg- D/C by provider due to being ineffective  Xanax 0.5 mg 3 times daily - [facility] Tizanidine 0.5 mg 2 times daily- Does not need [facility]  Flonase Nasal Spray- Plenty of supply only  uses as needed  Nitroglycerin 0.4- Has enough. Only uses prn  Diclofenac Sodium Gel 1% - Does not want    Patient is due for next adherence delivery on: 10/25/21. Called patient and reviewed medications and coordinated delivery  This delivery to include: Lansoprazole 30 mg 1 at  BB Vascepa 1 gm 2 at B 2 at EM Rosuvastatin 20 mg 1 at BT Clopidogrel 75 mg 1 at B Fenofibrate 160 mg 1 at B Lamotrigine 25 mg 2 at BT Lisinopril-HCTZ 20-25 mg 1 at B Breo Ellipta Inhaler 100-25 mcg 1 puff once daily  Ventolin Inhaler 50mcg 2 puffs every 4-6 hours as  needed   Patient declined the following medications  Xanax 0.5 mg 3 times daily - [facility] Tizanidine 0.5 mg 2 times daily- Does not need [facility]  Flonase Nasal Spray- Plenty of supply only uses as needed  Nitroglycerin 0.4- Has enough. Only uses prn  Diclofenac Sodium Gel 1% - Does not want  Valtrex 1000 mg two tablets for a day Only uses PRN, has plenty on hand  Test Strips and Lancets- [facility]  Note from pharmacy: Patient has been getting test strips from another pharmacy because they have to be billed part B, so we cannot run them for her. **Trulicity 3mg  has been on back order with no anticipated date of when it will be available, she will have to get at another pharmacy.  Patient needs refills None  Confirmed delivery date of 10/25/21, advised patient that pharmacy will contact them the morning of delivery.  [name], [place] Pharmacist Assistant  [phone]

## 2021-10-16 ENCOUNTER — Other Ambulatory Visit: Payer: Self-pay | Admitting: Family Medicine

## 2021-10-16 ENCOUNTER — Other Ambulatory Visit: Payer: Self-pay

## 2021-10-16 MED ORDER — TRULICITY 3 MG/0.5ML ~~LOC~~ SOAJ: 3.0000 mg | SUBCUTANEOUS | 0 refills | Status: DC

## 2021-10-16 NOTE — Telephone Encounter (Signed)
Trulicity out of stock at [facility], will call around to find replacement pharmacy to transfer script to

## 2021-10-17 ENCOUNTER — Telehealth: Payer: Self-pay

## 2021-10-17 NOTE — Progress Notes (Signed)
° ° °  Chronic Care Management Pharmacy Assistant   Name: Sonya Small  MRN: [id number] DOB: Aug 26, 1951   Reason for Encounter: No fill report  Updated report that Albuterol Inhaler is included for delivery on 2/123.    Medications: Outpatient Encounter Medications as of 10/17/2021  Medication Sig   ALPRAZolam (XANAX) 0.5 MG tablet Take 1 tablet (0.5 mg total) by mouth 3 (three) times daily.   Aspirin-Acetaminophen (GOODYS BODY PAIN PO) Take 1 packet by mouth daily.   clopidogrel (PLAVIX) 75 MG tablet TAKE ONE TABLET BY MOUTH EVERY MORNING   diclofenac Sodium (VOLTAREN) 1 % GEL    Dulaglutide (TRULICITY) 1.5 MD/4.7WL SOPN Inject 1.5 mg into the skin once a week.   fenofibrate 160 MG tablet TAKE ONE CAPSULE BY MOUTH EVERY MORNING   fluticasone (FLONASE) 50 MCG/ACT nasal spray INSTILL 1 SPRAY IN EACH NOSTRIL ONCE DAILY   fluticasone furoate-vilanterol (BREO ELLIPTA) 200-25 MCG/INH AEPB Inhale 1 puff into the lungs daily.   glucose blood (ONETOUCH VERIO) test strip Use as instructed   lamoTRIgine (LAMICTAL) 25 MG tablet Take 2 tablets (50 mg total) by mouth at bedtime.   lansoprazole (PREVACID) 30 MG capsule TAKE ONE CAPSULE BY MOUTH BEFORE BREAKFAST   lisinopril-hydrochlorothiazide (ZESTORETIC) 20-25 MG tablet Take 1 tablet by mouth daily.   nitrofurantoin, macrocrystal-monohydrate, (MACROBID) 100 MG capsule Take 1 capsule (100 mg total) by mouth 2 (two) times daily.   nitroGLYCERIN (NITROSTAT) 0.4 MG SL tablet Place 1 tablet (0.4 mg total) under the tongue every 5 (five) minutes as needed.   oxyCODONE-acetaminophen (PERCOCET) 10-325 MG tablet Take 1 tablet by mouth every 6 (six) hours as needed.   rosuvastatin (CRESTOR) 20 MG tablet TAKE ONE TABLET BY MOUTH EVERYDAY AT BEDTIME   tiZANidine (ZANAFLEX) 4 MG tablet 4 mg as needed. Take 1/2 tablet as needed   TRUEplus Lancets 30G MISC 1 each by Does not apply route 2 (two) times daily. E11.69   valACYclovir (VALTREX) 1000 MG tablet TAKE TWO  TABLETS BY MOUTH twice A DAY FOR ONE DAY as needed   VASCEPA 1 g capsule TAKE TWO CAPSULES BY MOUTH TWICE DAILY   No facility-administered encounter medications on file as of 10/17/2021.   [name], [place] Pharmacist Assistant  [phone]

## 2021-10-18 ENCOUNTER — Ambulatory Visit (INDEPENDENT_AMBULATORY_CARE_PROVIDER_SITE_OTHER): Payer: Medicare Other | Admitting: Family Medicine

## 2021-10-18 ENCOUNTER — Encounter: Payer: Self-pay | Admitting: Family Medicine

## 2021-10-18 ENCOUNTER — Other Ambulatory Visit: Payer: Self-pay

## 2021-10-18 VITALS — BP 140/68 | HR 80 | Temp 96.7°F | Ht 65.0 in | Wt 162.0 lb

## 2021-10-18 DIAGNOSIS — F332 Major depressive disorder, recurrent severe without psychotic features: Secondary | ICD-10-CM | POA: Diagnosis not present

## 2021-10-18 DIAGNOSIS — L989 Disorder of the skin and subcutaneous tissue, unspecified: Secondary | ICD-10-CM | POA: Diagnosis not present

## 2021-10-18 DIAGNOSIS — T402X5A Adverse effect of other opioids, initial encounter: Secondary | ICD-10-CM | POA: Insufficient documentation

## 2021-10-18 DIAGNOSIS — E1142 Type 2 diabetes mellitus with diabetic polyneuropathy: Secondary | ICD-10-CM

## 2021-10-18 DIAGNOSIS — K5903 Drug induced constipation: Secondary | ICD-10-CM

## 2021-10-18 MED ORDER — LINACLOTIDE 145 MCG PO CAPS: 145.0000 ug | ORAL_CAPSULE | Freq: Every day | ORAL | 0 refills | Status: DC

## 2021-10-18 MED ORDER — FETZIMA 20 MG PO CP24: 20.0000 mg | ORAL_CAPSULE | Freq: Every day | ORAL | 1 refills | Status: DC

## 2021-10-18 MED ORDER — MUPIROCIN 2 % EX OINT: 1.0000 "application " | TOPICAL_OINTMENT | Freq: Two times a day (BID) | CUTANEOUS | 0 refills | Status: DC

## 2021-10-18 NOTE — Patient Instructions (Signed)
Start Fetzima 20 mg once daily. Continue Lamictal until the end of this pill pack. Start Linzess 145 mg once daily for constipation. Use mupirocin ointment on sore areas on head.

## 2021-10-18 NOTE — Assessment & Plan Note (Addendum)
Start Fetzima 20 mg once daily. Patient instructed to wait until Linzess helps her have a BM. Constipation can be a side effect of fetzima.  Continue Lamictal until the end of this pill pack. Continue xanax.   Follow up with [facility] pharmacy team prior to next pill pack. If tolerating, but not helping, may wish to increase to 40 mg daily. Also consider stopping lamictal at next pill pack fill as I do not know that it is really helping.

## 2021-10-18 NOTE — Assessment & Plan Note (Addendum)
Start Linzess 145 mg once daily for constipation. Given #8 sample pills.  I would like [facility] pharmacy team to check and see if it is helping and if she is tolerating it, as she will need a prescription if so.

## 2021-10-18 NOTE — Progress Notes (Signed)
Subjective:  Patient ID: Sonya Small, female    DOB: [birth year]  Age: 71 y.o. MRN: [id number]  Chief Complaint  Patient presents with   Depression   HPI:  Depression:  Patient has been struggling with depression for years.  Currently her brother is very sick and in hospice care.  This is a significant cause of her worsening depression.  Patient has been tried on numerous medications in the past which have either been unsuccessful or had side effects.  The list includes Prozac, Effexor, Trintellix, Wellbutrin, citalopram, Lexapro, and duloxetine.  Patient is currently on Lamictal 25 mg 2 at night.  It is somewhat unclear if this actually helps.Concerned about the possibility of bipolar disorder.  Believe we even tried Vraylar but this did not help and feel like she did not tolerate it.  Previously we tried Abilify which caused confusion.    I ordered a [employer] psychotropic panel to try to find a medication that would help her.  Patient presents today to review this and consider changes. PHQ9 SCORE ONLY 10/18/2021 09/07/2021 07/27/2021  PHQ-9 Total Score 18 18 19    Area on top of head: sore. Started months ago, but getting worse. Bleeds when hit with brush or comb.      Current Outpatient Medications on File Prior to Visit  Medication Sig Dispense Refill   albuterol (PROVENTIL) (2.5 MG/3ML) 0.083% nebulizer solution Take 2.5 mg by nebulization 4 (four) times daily.     albuterol (VENTOLIN HFA) 108 (90 Base) MCG/ACT inhaler Inhale 2 puffs into the lungs every 4 (four) hours as needed.     ALPRAZolam (XANAX) 0.5 MG tablet Take 1 tablet (0.5 mg total) by mouth 3 (three) times daily. 90 tablet 2   Aspirin-Acetaminophen (GOODYS BODY PAIN PO) Take 1 packet by mouth daily.     BREO ELLIPTA 100-25 MCG/ACT AEPB Inhale 1 puff into the lungs daily.     clopidogrel (PLAVIX) 75 MG tablet TAKE ONE TABLET BY MOUTH EVERY MORNING 90 tablet 2   diclofenac Sodium (VOLTAREN) 1 % GEL      Dulaglutide  (TRULICITY) 1.5 JA/2.5KN SOPN Inject 1.5 mg into the skin once a week. 2 mL 2   fenofibrate 160 MG tablet TAKE ONE CAPSULE BY MOUTH EVERY MORNING 90 tablet 2   fluticasone (FLONASE) 50 MCG/ACT nasal spray INSTILL 1 SPRAY IN EACH NOSTRIL ONCE DAILY 16 g 1   glucose blood (ONETOUCH VERIO) test strip Use as instructed 100 strip 3   lamoTRIgine (LAMICTAL) 25 MG tablet Take 2 tablets (50 mg total) by mouth at bedtime. 180 tablet 1   lansoprazole (PREVACID) 30 MG capsule TAKE ONE CAPSULE BY MOUTH BEFORE BREAKFAST 90 capsule 2   lisinopril-hydrochlorothiazide (ZESTORETIC) 20-25 MG tablet Take 1 tablet by mouth daily. 30 tablet 3   nitrofurantoin, macrocrystal-monohydrate, (MACROBID) 100 MG capsule Take 1 capsule (100 mg total) by mouth 2 (two) times daily. 14 capsule 0   nitroGLYCERIN (NITROSTAT) 0.4 MG SL tablet Place 1 tablet (0.4 mg total) under the tongue every 5 (five) minutes as needed. 25 tablet 1   nystatin (MYCOSTATIN) 100000 UNIT/ML suspension      oxyCODONE-acetaminophen (PERCOCET) 10-325 MG tablet Take 1 tablet by mouth every 6 (six) hours as needed.     rosuvastatin (CRESTOR) 20 MG tablet TAKE ONE TABLET BY MOUTH EVERYDAY AT BEDTIME 90 tablet 2   tiZANidine (ZANAFLEX) 4 MG tablet 4 mg as needed. Take 1/2 tablet as needed     TRUEplus Lancets 30G MISC 1  each by Does not apply route 2 (two) times daily. E11.69 100 each 3   valACYclovir (VALTREX) 1000 MG tablet TAKE TWO TABLETS BY MOUTH twice A DAY FOR ONE DAY as needed 10 tablet 2   VASCEPA 1 g capsule TAKE TWO CAPSULES BY MOUTH TWICE DAILY 360 capsule 2   No current facility-administered medications on file prior to visit.   Past Medical History:  Diagnosis Date   Anxiety    CAD (coronary artery disease)    a. NSTEMI 11/2008 s/p DES to LCx (3.0x12 Xience); b. NSTEMI 01/2010 secondary to thrombotic RCA lesion (non-obstructive)-->med rx (integrilin x 24 hrs + plavix); c. 09/2012 negative Myoview.   Chronic diastolic CHF (congestive heart  failure) ([place])    a. 06/2014 Echo: EF 55-60%, no rwma, Gr1 DD, mild AI.   Depression    Dizziness    Drug induced constipation    Ganglion cyst of left foot 01/17/2021   Generalized hyperhidrosis    GERD (gastroesophageal reflux disease)    Headache    Hyperlipidemia    Hypertensive heart disease    Lumbar disc disease    Metabolic encephalopathy    Mixed hyperlipidemia    Myocardial infarction ([place])    Obstructive sleep apnea    OP (osteoporosis)    Osteoarthritis    Osteoporosis    Other malaise    Overweight(278.02)    PAD (peripheral artery disease) ([place])    a. Emboli to R foot 2010 from partially occlusive lesion in R EIA, s/p stenting. - followed by Dr. Donnetta Hutching;  b. 10/2015 ABIs: R 1.03, L 0.97.   Restless leg    Sleep apnea    Stroke [facility])    TIA (transient ischemic attack)    Tobacco abuse    Urge incontinence    Past Surgical History:  Procedure Laterality Date   CYST EXCISION Left 01/2021   left foot   EYE SURGERY     at age 28   hysterectomy -age 71     ILIAC ARTERY STENT     RIGHT ILIAC STENT   KNEE ARTHROSCOPY     LITHOTRIPSY Left 12/2020   LUMBAR LAMINECTOMY     TUBAL LIGATION      Family History  Problem Relation Age of Onset   Alzheimer's disease Mother    Hodgkin's lymphoma Brother    Lung cancer Brother    Hepatitis C Brother    Hypothyroidism Brother    Hypertension Brother    Depression Brother    Heart disease Other        Grandfather   Social History   Socioeconomic History   Marital status: Divorced    Spouse name: Not on file   Number of children: 3   Years of education: 10 th   Highest education level: Not on file  Occupational History   Occupation: DISABLED    Employer: UNEMPLOYED  Tobacco Use   Smoking status: Every Day    Packs/day: 0.50    Years: 54.00    Pack years: 27.00    Types: Cigarettes    Last attempt to quit: 11/20/2012    Years since quitting: 8.9   Smokeless tobacco: Never   Tobacco comments:    2 ppd for  many years.   Substance and Sexual Activity   Alcohol use: No    Alcohol/week: 0.0 standard drinks   Drug use: No   Sexual activity: Never  Other Topics Concern   Not on file  Social History Narrative  Patient is single with 3 children, 1 deceased.   Patient is right handed.   Patient has 10 th grade education.   Patient drinks 5 or more cups daily.   Social Determinants of Health   Financial Resource Strain: High Risk   Difficulty of Paying Living Expenses: Hard  Food Insecurity: Not on file  Transportation Needs: Not on file  Physical Activity: Not on file  Stress: Not on file  Social Connections: Not on file    Review of Systems  Constitutional:  Negative for appetite change, fatigue and fever.  HENT:  Negative for congestion, ear pain, sinus pressure and sore throat.   Eyes:  Negative for pain.  Respiratory:  Negative for cough, chest tightness, shortness of breath and wheezing.   Cardiovascular:  Negative for chest pain and palpitations.  Gastrointestinal:  Positive for constipation (pt has tried miralax and stool softeners otc. Has not had a bm in 1 week. Pt is on narcotics from the pain clinic.). Negative for abdominal pain, diarrhea, nausea and vomiting.  Genitourinary:  Negative for dysuria and hematuria.  Musculoskeletal:  Negative for arthralgias, back pain, joint swelling and myalgias.  Skin:  Negative for rash.       Sores on head.   Neurological:  Negative for dizziness, weakness and headaches.  Psychiatric/Behavioral:  Positive for decreased concentration, dysphoric mood and sleep disturbance. Negative for suicidal ideas. The patient is nervous/anxious.     Objective:  BP 140/68 (BP Location: Right Arm, Patient Position: Sitting)    Pulse 80    Temp (!) 96.7 F (35.9 C) (Temporal)    Ht 5\' 5"  (1.651 m)    Wt 162 lb (73.5 kg)    SpO2 98%    BMI 26.96 kg/m   BP/Weight 10/18/2021 09/07/2021 46/65/9935  Systolic BP 701 779 -  Diastolic BP 68 70 -  Wt. (Lbs)  162 159.6 163  BMI 26.96 26.56 27.12    Physical Exam Vitals reviewed.  Constitutional:      Appearance: Normal appearance. She is normal weight.  Cardiovascular:     Rate and Rhythm: Normal rate and regular rhythm.     Heart sounds: Normal heart sounds.  Pulmonary:     Effort: Pulmonary effort is normal. No respiratory distress.     Breath sounds: Normal breath sounds.  Abdominal:     Palpations: Abdomen is soft.     Tenderness: There is no abdominal tenderness.  Skin:    Comments: Linear scalp lesions x 2 approximately 1 inch long and 2 mm wide. It looks almost like she has healing wounds from sutures, but she denies history of this. Both are scabbed over. Sensitive to touch. No nodules. No erythema surrounding   Neurological:     Mental Status: She is alert and oriented to person, place, and time.  Psychiatric:        Behavior: Behavior normal.     Comments: Depressed although affect never seems as bad as her phq9 scores.     Diabetic Foot Exam - Simple   No data filed      Lab Results  Component Value Date   WBC 8.6 09/07/2021   HGB 14.8 09/07/2021   HCT 44.5 09/07/2021   PLT 274 09/07/2021   GLUCOSE 133 (H) 09/07/2021   CHOL 134 09/07/2021   TRIG 178 (H) 09/07/2021   HDL 35 (L) 09/07/2021   LDLCALC 69 09/07/2021   ALT 14 09/07/2021   AST 21 09/07/2021   NA 142 09/07/2021  K 3.7 09/07/2021   CL 103 09/07/2021   CREATININE 0.72 09/07/2021   BUN 22 09/07/2021   CO2 23 09/07/2021   TSH 0.619 12/04/2019   INR 1.0 01/31/2019   HGBA1C 6.6 (H) 09/07/2021   MICROALBUR neg 02/27/2021      Assessment & Plan:   Problem List Items Addressed This Visit       Digestive   Therapeutic opioid-induced constipation (OIC)    Start Linzess 145 mg once daily for constipation. Given #8 sample pills.  I would like [facility] pharmacy team to check and see if it is helping and if she is tolerating it, as she will need a prescription if so.           Endocrine   Diabetic  polyneuropathy associated with type 2 diabetes mellitus (HCC)   Relevant Medications   Levomilnacipran HCl ER (FETZIMA) 20 MG CP24   Other Relevant Orders   Microalbumin / creatinine urine ratio     Musculoskeletal and Integument   Skin sore    Looks like linear lacerations.  Avoid irritating them with combs/brush or scratching.  Use mupirocin ointment twice daily on sore areas on head until resolves..         Other   Recurrent major depression-severe ([place]) - Primary    Start Fetzima 20 mg once daily. Patient instructed to wait until Linzess helps her have a BM. Constipation can be a side effect of fetzima.  Continue Lamictal until the end of this pill pack. Continue xanax.   Follow up with [facility] pharmacy team prior to next pill pack. If tolerating, but not helping, may wish to increase to 40 mg daily. Also consider stopping lamictal at next pill pack fill as I do not know that it is really helping.        Relevant Medications   Levomilnacipran HCl ER (FETZIMA) 20 MG CP24   Other Visit Diagnoses     Severe recurrent major depression without psychotic features ([place])       Relevant Medications   Levomilnacipran HCl ER (FETZIMA) 20 MG CP24     .   Follow-up: Return in about 2 months (around 12/16/2021) for chronic fasting, [facility] pharmacy follow up in 1-2 weeks. .  An After Visit Summary was printed and given to the patient.  Total time spent on today's visit was greater than 30 minutes, including both face-to-face time and nonface-to-face time personally spent on review of chart (labs and imaging), discussing labs and goals, discussing further work-up, treatment options, referrals to specialist if needed, reviewing outside records of pertinent, answering patient's questions, and coordinating care.    I,Lauren M Auman,acting as a scribe for [name], MD.,have documented all relevant documentation on the behalf of [name], MD,as directed by  [name], MD while in the  presence of [name], MD.  [name], MD [place] [phone]

## 2021-10-18 NOTE — Assessment & Plan Note (Addendum)
Looks like linear lacerations.  Avoid irritating them with combs/brush or scratching.  Use mupirocin ointment twice daily on sore areas on head until resolves.[name]

## 2021-10-19 ENCOUNTER — Telehealth: Payer: Self-pay

## 2021-10-19 LAB — MICROALBUMIN / CREATININE URINE RATIO
Creatinine, Urine: 95.6 mg/dL
Microalb/Creat Ratio: 4 mg/g creat (ref 0–29)
Microalbumin, Urine: 4.3 ug/mL

## 2021-10-19 NOTE — Progress Notes (Signed)
° ° °  Chronic Care Management Pharmacy Assistant   Name: SARANYA HARLIN  MRN: [id number] DOB: April 07, 1951   Reason for Encounter: Refill process  I sent a message to the provider about Trulicity being on back order. [facility] [place] They had to decrease the dosage on this since its on back order. Pt was made aware today and will make sure to pick that up. [facility] also received Fetzima and Bactroban Ointment and will get these delivered. Pt is aware.     Medications: Outpatient Encounter Medications as of 10/19/2021  Medication Sig   albuterol (PROVENTIL) (2.5 MG/3ML) 0.083% nebulizer solution Take 2.5 mg by nebulization 4 (four) times daily.   albuterol (VENTOLIN HFA) 108 (90 Base) MCG/ACT inhaler Inhale 2 puffs into the lungs every 4 (four) hours as needed.   ALPRAZolam (XANAX) 0.5 MG tablet Take 1 tablet (0.5 mg total) by mouth 3 (three) times daily.   Aspirin-Acetaminophen (GOODYS BODY PAIN PO) Take 1 packet by mouth daily.   BREO ELLIPTA 100-25 MCG/ACT AEPB Inhale 1 puff into the lungs daily.   clopidogrel (PLAVIX) 75 MG tablet TAKE ONE TABLET BY MOUTH EVERY MORNING   diclofenac Sodium (VOLTAREN) 1 % GEL    Dulaglutide (TRULICITY) 1.5 QD/8.2ME SOPN Inject 1.5 mg into the skin once a week.   fenofibrate 160 MG tablet TAKE ONE CAPSULE BY MOUTH EVERY MORNING   fluticasone (FLONASE) 50 MCG/ACT nasal spray INSTILL 1 SPRAY IN EACH NOSTRIL ONCE DAILY   glucose blood (ONETOUCH VERIO) test strip Use as instructed   lamoTRIgine (LAMICTAL) 25 MG tablet Take 2 tablets (50 mg total) by mouth at bedtime.   lansoprazole (PREVACID) 30 MG capsule TAKE ONE CAPSULE BY MOUTH BEFORE BREAKFAST   Levomilnacipran HCl ER (FETZIMA) 20 MG CP24 Take 20 mg by mouth daily.   linaclotide (LINZESS) 145 MCG CAPS capsule Take 1 capsule (145 mcg total) by mouth daily before breakfast.   lisinopril-hydrochlorothiazide (ZESTORETIC) 20-25 MG tablet Take 1 tablet by mouth daily.   mupirocin  ointment (BACTROBAN) 2 % Apply 1 application topically 2 (two) times daily.   nitrofurantoin, macrocrystal-monohydrate, (MACROBID) 100 MG capsule Take 1 capsule (100 mg total) by mouth 2 (two) times daily.   nitroGLYCERIN (NITROSTAT) 0.4 MG SL tablet Place 1 tablet (0.4 mg total) under the tongue every 5 (five) minutes as needed.   nystatin (MYCOSTATIN) 100000 UNIT/ML suspension    oxyCODONE-acetaminophen (PERCOCET) 10-325 MG tablet Take 1 tablet by mouth every 6 (six) hours as needed.   rosuvastatin (CRESTOR) 20 MG tablet TAKE ONE TABLET BY MOUTH EVERYDAY AT BEDTIME   tiZANidine (ZANAFLEX) 4 MG tablet 4 mg as needed. Take 1/2 tablet as needed   TRUEplus Lancets 30G MISC 1 each by Does not apply route 2 (two) times daily. E11.69   valACYclovir (VALTREX) 1000 MG tablet TAKE TWO TABLETS BY MOUTH twice A DAY FOR ONE DAY as needed   VASCEPA 1 g capsule TAKE TWO CAPSULES BY MOUTH TWICE DAILY   No facility-administered encounter medications on file as of 10/19/2021.   [name], [place] Pharmacist Assistant  [phone]

## 2021-10-20 NOTE — Chronic Care Management (AMB) (Signed)
10/20/2021- Patient called to speak with [place] Constable. Called patient back to inform that Ovid Curd was out of the office until Monday and to see if there was anything I could help her with. No answer, left message to return call with questions or I will send a message to have Ovid Curd call her on Monday.  Pattricia Boss, [place] Pharmacist Assistant [phone]

## 2021-10-23 ENCOUNTER — Telehealth: Payer: Self-pay

## 2021-10-23 NOTE — Telephone Encounter (Signed)
[facility] out of stock of Tr[facility] 3.0mg . [facility]  was [facility]. [facility]. Will have team call at 0900, see if 3.0mg /week dose in stock, if it is, will ask Dr. [name] to send in.  Spent time talking with patient and counseling about DM

## 2021-10-23 NOTE — Telephone Encounter (Signed)
Trulicity 3mg  out of stock everywhere. Patient said she has enough to last her 1 month. Will wait until next month to see if still out of stock and will re-assess again

## 2021-10-23 NOTE — Chronic Care Management (AMB) (Signed)
° ° °  Chronic Care Management Pharmacy Assistant   Name: Sonya Small  MRN: [id number] DOB: 16-May-1951  Reason for Encounter: Patient Assistance Coordination  10/23/2021- Received notification from [facility], PA required  Fetzima 20 mg, prior authorization sent through CoverMyMeds, awaiting determination. [facility] notified.  Determination received: FETZIMA Capsule ER 24HR Type of coverage approved: Prior Authorization This approval authorizes your coverage from 09/24/2021 - 10/23/2022.  [facility] notified.   Medications: Outpatient Encounter Medications as of 10/23/2021  Medication Sig   albuterol (PROVENTIL) (2.5 MG/3ML) 0.083% nebulizer solution Take 2.5 mg by nebulization 4 (four) times daily.   albuterol (VENTOLIN HFA) 108 (90 Base) MCG/ACT inhaler Inhale 2 puffs into the lungs every 4 (four) hours as needed.   ALPRAZolam (XANAX) 0.5 MG tablet Take 1 tablet (0.5 mg total) by mouth 3 (three) times daily.   Aspirin-Acetaminophen (GOODYS BODY PAIN PO) Take 1 packet by mouth daily.   BREO ELLIPTA 100-25 MCG/ACT AEPB Inhale 1 puff into the lungs daily.   clopidogrel (PLAVIX) 75 MG tablet TAKE ONE TABLET BY MOUTH EVERY MORNING   diclofenac Sodium (VOLTAREN) 1 % GEL    Dulaglutide (TRULICITY) 1.5 CL/2.7NT SOPN Inject 1.5 mg into the skin once a week.   fenofibrate 160 MG tablet TAKE ONE CAPSULE BY MOUTH EVERY MORNING   fluticasone (FLONASE) 50 MCG/ACT nasal spray INSTILL 1 SPRAY IN EACH NOSTRIL ONCE DAILY   glucose blood (ONETOUCH VERIO) test strip Use as instructed   lamoTRIgine (LAMICTAL) 25 MG tablet Take 2 tablets (50 mg total) by mouth at bedtime.   lansoprazole (PREVACID) 30 MG capsule TAKE ONE CAPSULE BY MOUTH BEFORE BREAKFAST   Levomilnacipran HCl ER (FETZIMA) 20 MG CP24 Take 20 mg by mouth daily.   linaclotide (LINZESS) 145 MCG CAPS capsule Take 1 capsule (145 mcg total) by mouth daily before breakfast.   lisinopril-hydrochlorothiazide (ZESTORETIC) 20-25 MG  tablet Take 1 tablet by mouth daily.   mupirocin ointment (BACTROBAN) 2 % Apply 1 application topically 2 (two) times daily.   nitrofurantoin, macrocrystal-monohydrate, (MACROBID) 100 MG capsule Take 1 capsule (100 mg total) by mouth 2 (two) times daily.   nitroGLYCERIN (NITROSTAT) 0.4 MG SL tablet Place 1 tablet (0.4 mg total) under the tongue every 5 (five) minutes as needed.   nystatin (MYCOSTATIN) 100000 UNIT/ML suspension    oxyCODONE-acetaminophen (PERCOCET) 10-325 MG tablet Take 1 tablet by mouth every 6 (six) hours as needed.   rosuvastatin (CRESTOR) 20 MG tablet TAKE ONE TABLET BY MOUTH EVERYDAY AT BEDTIME   tiZANidine (ZANAFLEX) 4 MG tablet 4 mg as needed. Take 1/2 tablet as needed   TRUEplus Lancets 30G MISC 1 each by Does not apply route 2 (two) times daily. E11.69   valACYclovir (VALTREX) 1000 MG tablet TAKE TWO TABLETS BY MOUTH twice A DAY FOR ONE DAY as needed   VASCEPA 1 g capsule TAKE TWO CAPSULES BY MOUTH TWICE DAILY   No facility-administered encounter medications on file as of 10/23/2021.    Pattricia Boss, [place] Pharmacist Assistant [phone]

## 2021-10-24 NOTE — Telephone Encounter (Signed)
Coordinated with team for fetzima PA

## 2021-11-03 ENCOUNTER — Other Ambulatory Visit: Payer: Self-pay

## 2021-11-03 DIAGNOSIS — R5383 Other fatigue: Secondary | ICD-10-CM

## 2021-11-03 MED ORDER — ALPRAZOLAM 0.5 MG PO TABS: 0.5000 mg | ORAL_TABLET | Freq: Three times a day (TID) | ORAL | 2 refills | Status: DC

## 2021-11-13 ENCOUNTER — Telehealth: Payer: Self-pay

## 2021-11-13 IMAGING — DX DG CHEST 2V
2 series · 2 of 2 positions shown · non-contrast
Comparison: 10/29/2019 and prior chest radiographs

CLINICAL DATA: 69-year-old female with chest pain for 3 weeks.

EXAM:
CHEST - 2 VIEW

[chest pa]
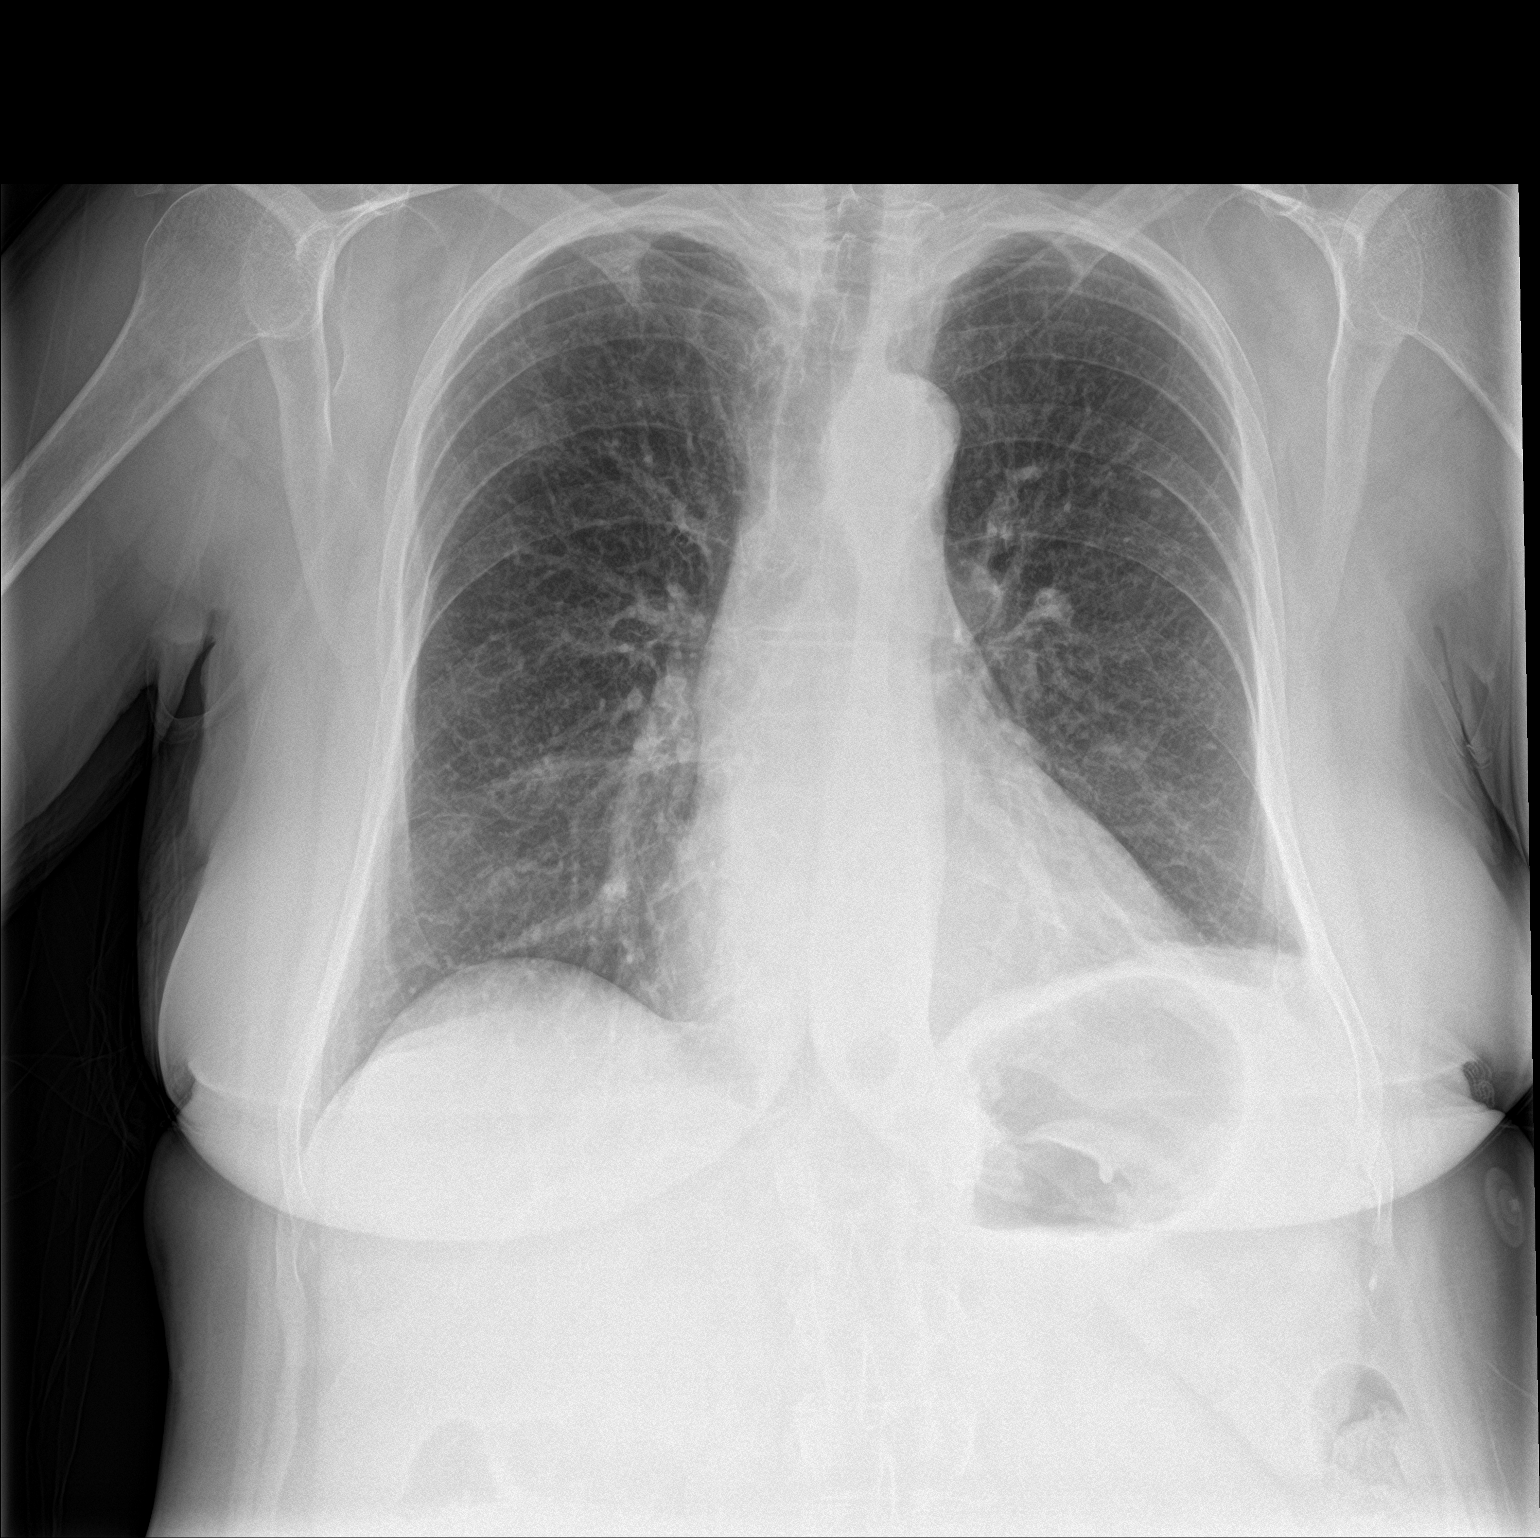

[chest lat]
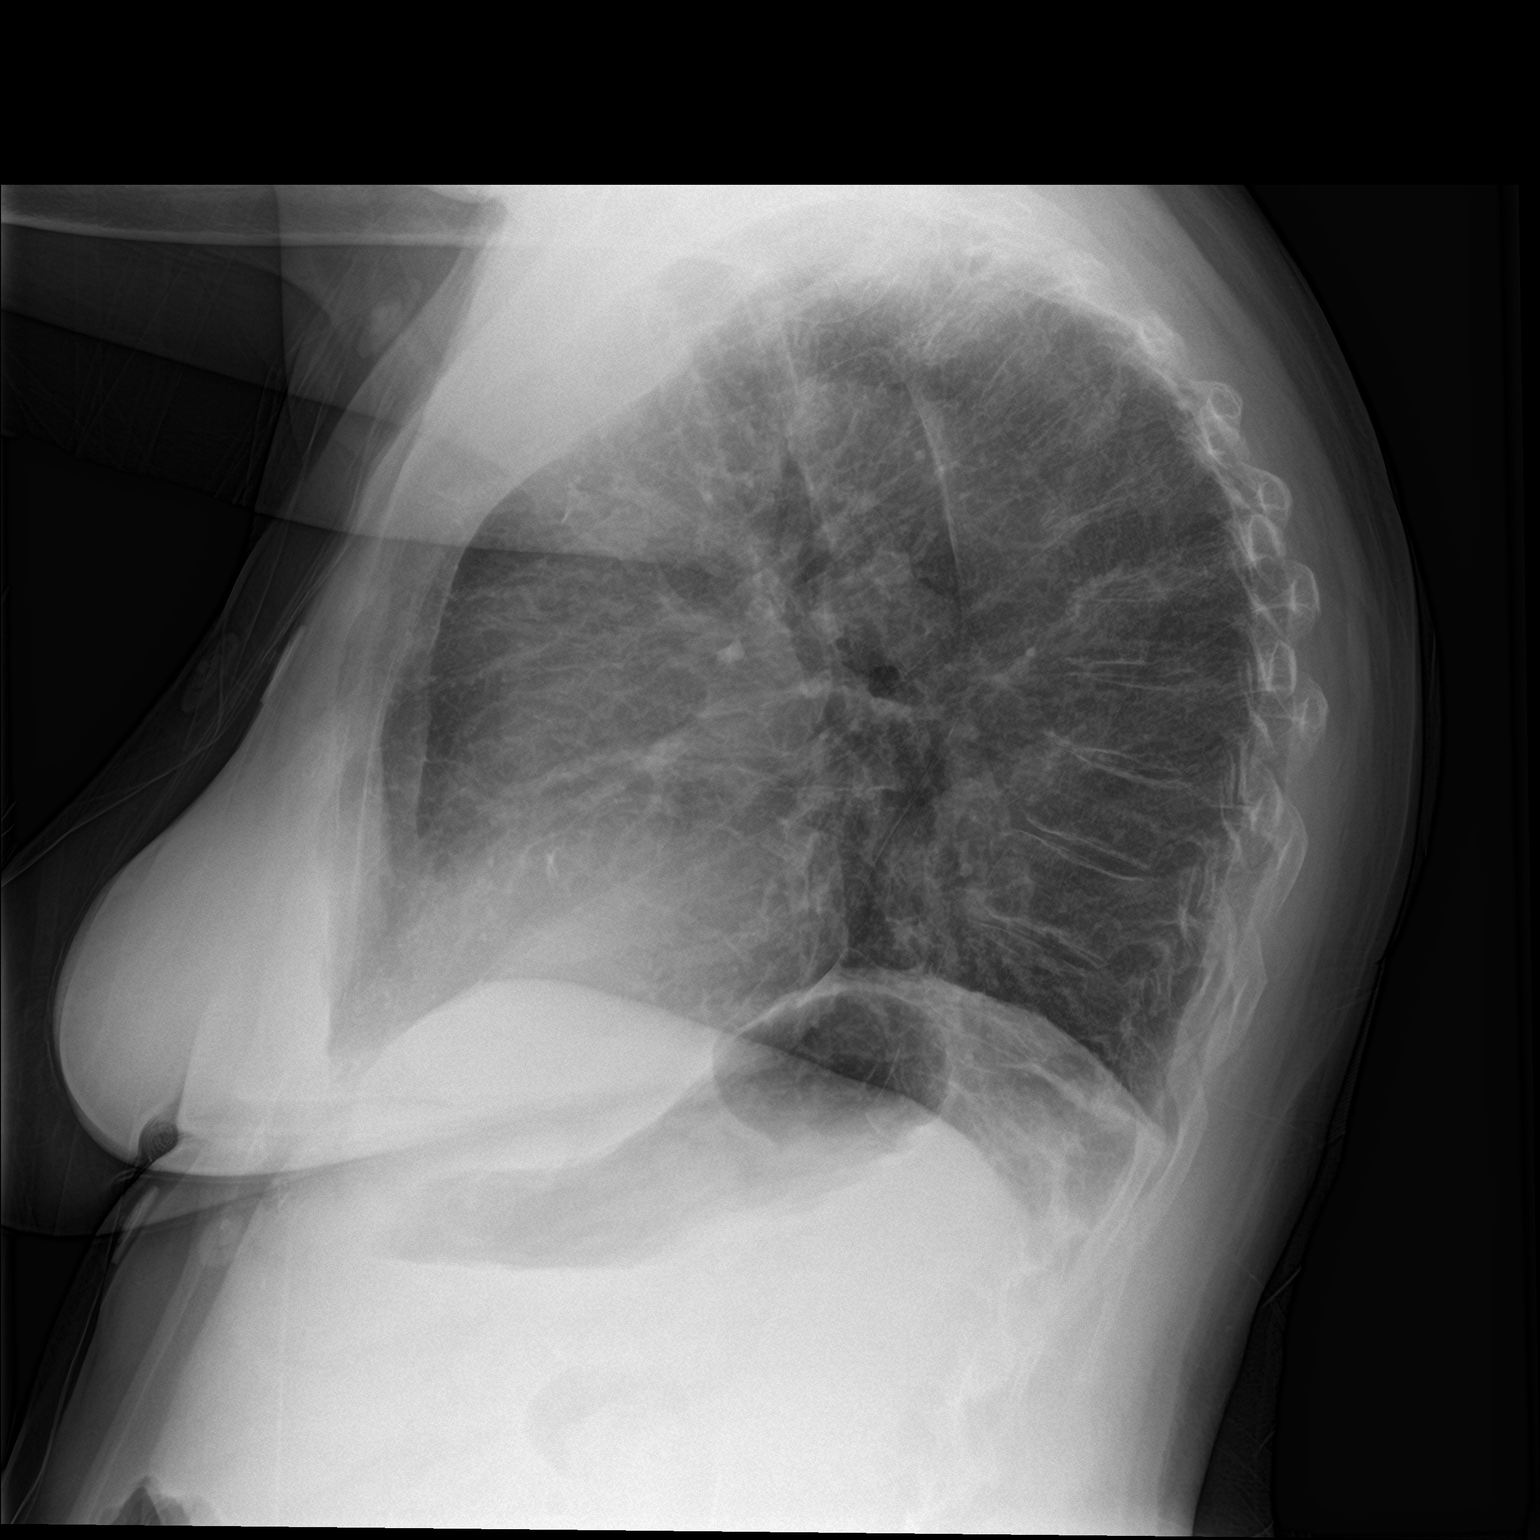

[2 of 2 positions shown; findings below may reference images not displayed]

FINDINGS: The cardiomediastinal silhouette is unremarkable.

Mild chronic peribronchial thickening again noted.

There is no evidence of focal airspace disease, pulmonary edema,
suspicious pulmonary nodule/mass, pleural effusion, or pneumothorax.

No acute bony abnormalities are identified.
IMPRESSION: No active cardiopulmonary disease.

## 2021-11-13 NOTE — Chronic Care Management (AMB) (Signed)
Chronic Care Management Pharmacy Assistant   Name: Sonya Small  MRN: [id number] DOB: Dec 27, 1950   Reason for Encounter: Medication Coordination for [facility]   Recent office visits:  10/18/21 [name] MD. Seen for Depression. Started on Levomilnacipran HCI 20mg  daily, Started on Linaclotide 122mcg and Mupirocin 2%. Decreased Fluticasone Furoate-Vilanterol from 200-84mcg to 100-25mcg.   Recent consult visits:  None  Hospital visits:  None  Medications: Outpatient Encounter Medications as of 11/13/2021  Medication Sig   albuterol (PROVENTIL) (2.5 MG/3ML) 0.083% nebulizer solution Take 2.5 mg by nebulization 4 (four) times daily.   albuterol (VENTOLIN HFA) 108 (90 Base) MCG/ACT inhaler Inhale 2 puffs into the lungs every 4 (four) hours as needed.   ALPRAZolam (XANAX) 0.5 MG tablet Take 1 tablet (0.5 mg total) by mouth 3 (three) times daily.   Aspirin-Acetaminophen (GOODYS BODY PAIN PO) Take 1 packet by mouth daily.   BREO ELLIPTA 100-25 MCG/ACT AEPB Inhale 1 puff into the lungs daily.   clopidogrel (PLAVIX) 75 MG tablet TAKE ONE TABLET BY MOUTH EVERY MORNING   diclofenac Sodium (VOLTAREN) 1 % GEL    Dulaglutide (TRULICITY) 1.5 GX/2.1JH SOPN Inject 1.5 mg into the skin once a week.   fenofibrate 160 MG tablet TAKE ONE CAPSULE BY MOUTH EVERY MORNING   fluticasone (FLONASE) 50 MCG/ACT nasal spray INSTILL 1 SPRAY IN EACH NOSTRIL ONCE DAILY   glucose blood (ONETOUCH VERIO) test strip Use as instructed   lamoTRIgine (LAMICTAL) 25 MG tablet Take 2 tablets (50 mg total) by mouth at bedtime.   lansoprazole (PREVACID) 30 MG capsule TAKE ONE CAPSULE BY MOUTH BEFORE BREAKFAST   Levomilnacipran HCl ER (FETZIMA) 20 MG CP24 Take 20 mg by mouth daily.   linaclotide (LINZESS) 145 MCG CAPS capsule Take 1 capsule (145 mcg total) by mouth daily before breakfast.   lisinopril-hydrochlorothiazide (ZESTORETIC) 20-25 MG tablet Take 1 tablet by mouth daily.   mupirocin ointment (BACTROBAN) 2 % Apply 1  application topically 2 (two) times daily.   nitrofurantoin, macrocrystal-monohydrate, (MACROBID) 100 MG capsule Take 1 capsule (100 mg total) by mouth 2 (two) times daily.   nitroGLYCERIN (NITROSTAT) 0.4 MG SL tablet Place 1 tablet (0.4 mg total) under the tongue every 5 (five) minutes as needed.   nystatin (MYCOSTATIN) 100000 UNIT/ML suspension    oxyCODONE-acetaminophen (PERCOCET) 10-325 MG tablet Take 1 tablet by mouth every 6 (six) hours as needed.   rosuvastatin (CRESTOR) 20 MG tablet TAKE ONE TABLET BY MOUTH EVERYDAY AT BEDTIME   tiZANidine (ZANAFLEX) 4 MG tablet 4 mg as needed. Take 1/2 tablet as needed   TRUEplus Lancets 30G MISC 1 each by Does not apply route 2 (two) times daily. E11.69   valACYclovir (VALTREX) 1000 MG tablet TAKE TWO TABLETS BY MOUTH twice A DAY FOR ONE DAY as needed   VASCEPA 1 g capsule TAKE TWO CAPSULES BY MOUTH TWICE DAILY   No facility-administered encounter medications on file as of 11/13/2021.   Reviewed chart for medication changes ahead of medication coordination call.  No Consults, or hospital visits since last care coordination call/Pharmacist visit.   No medication changes indicated OR if recent visit, treatment plan here.  BP Readings from Last 3 Encounters:  10/18/21 140/68  09/07/21 136/70  07/31/21 105/62    Lab Results  Component Value Date   HGBA1C 6.6 (H) 09/07/2021     Patient obtains medications through Adherence Packaging  30 Days   Last adherence delivery included:  Lansoprazole 30 mg 1 at  [place]  1 gm 2 at B 2 at EM Rosuvastatin 20 mg 1 at BT Clopidogrel 75 mg 1 at B Fenofibrate 160 mg 1 at B Lamotrigine 25 mg 2 at BT Lisinopril-HCTZ 20-25 mg 1 at B Breo Ellipta Inhaler 100-25 mcg 1 puff once daily       Ventolin Inhaler 32mcg 2 puffs every 4-6 hours as needed    Patient declined (meds) last month  Xanax 0.5 mg 3 times daily - [facility] Tizanidine 0.5 mg 2 times daily- [facility] not need [facility]  Flonase Nasal  Spray- Plenty of supply only uses as needed  Nitroglycerin 0.4- Has enough. Only uses prn  Diclofenac Sodium Gel 1% - Does not want  Valtrex 1000 mg two tablets for a day Only uses PRN, has plenty on hand  Test Strips and Lancets- [facility]  Patient is due for next adherence delivery on: 11/23/21. Called patient and reviewed medications and coordinated delivery.  This delivery to include: Mupirocin Ointment 2%  Lansoprazole 30 mg 1 at  BB Vascepa 1 gm 2 at B 2 at EM Rosuvastatin 20 mg 1 at BT Clopidogrel 75 mg 1 at B Fenofibrate 160 mg 1 at B Fetzima 20mg  1 at B Lamotrigine 25 mg 2 at BT Lisinopril-HCTZ 20-25 mg 1 at B [employer] Inhaler 100-25 mcg 1 puff once daily       Ventolin Inhaler 92mcg 2 puffs every 4-6 hours as needed    Patient declined the following medications  Xanax 0.5 mg 3 times daily - [facility] Tizanidine 0.5 mg 2 times daily- [facility] not need [facility]  Flonase Nasal Spray- Plenty of supply only uses as needed  Nitroglycerin 0.4- Has enough. Only uses prn  Diclofenac Sodium Gel 1% - Does not want  Valtrex 1000 mg two tablets for a day Only uses PRN, has plenty on hand  Test Strips and Lancets- [facility]  Note from pharmacy: trulicity still on back order and we are not able to get, patient gets test strips and lancets from another pharmacy.  Patient needs refills Mupirocin Ointment 2%  Lisinopril-HCTZ 20-25 mg 1 at B Breo Ellipta Inhaler 100-25 mcg 1 puff once daily          Unable to reach pt to complete this call  [name], [place] Pharmacist Assistant  [phone]

## 2021-11-16 NOTE — Telephone Encounter (Addendum)
Patient has 3 weeks of Trulicity left, will re-sync  Patient can't tolerate Linzess, bowels uncontrollable. Removed from profile

## 2021-11-17 ENCOUNTER — Other Ambulatory Visit: Payer: Self-pay | Admitting: Family Medicine

## 2021-11-17 DIAGNOSIS — I119 Hypertensive heart disease without heart failure: Secondary | ICD-10-CM

## 2021-11-30 ENCOUNTER — Telehealth: Payer: Self-pay

## 2021-11-30 NOTE — Progress Notes (Signed)
Pt called and lvm that she had questions about her medication delivery. I called and lvm to return my call if she still had questions. ? ?[name], CMA ?Clinical Pharmacist Assistant  ?[phone]  ?

## 2021-12-08 ENCOUNTER — Telehealth: Payer: Self-pay

## 2021-12-08 NOTE — Progress Notes (Signed)
Pt has confirmed with CPP on 3/21 ? ?[name], CMA ?Clinical Pharmacist Assistant  ?[phone]  ?

## 2021-12-12 ENCOUNTER — Telehealth: Payer: Medicare Other

## 2021-12-12 ENCOUNTER — Telehealth: Payer: Self-pay

## 2021-12-12 NOTE — Telephone Encounter (Signed)
?  Care Management  ? ?Follow Up Note ? ? ?12/12/2021 ?Name: Sonya Small MRN: [id number] DOB: July 15, 1951 ? ? ?Referred by: [name], MD ?Reason for referral : Chronic Care Management ? ? ?Successful contact was made with the patient to discuss care management and care coordination services. Patient declines engagement at this time.  Patient was with brother and would like to reschedule ? ?Follow Up Plan: The patient has been provided with contact information for the care management team and has been advised to call with any health related questions or concerns.  ? ?[place] Constable, Pharm.D. - [phone] ? ? ?

## 2021-12-13 ENCOUNTER — Telehealth: Payer: Self-pay

## 2021-12-13 NOTE — Progress Notes (Signed)
? ? ?Chronic Care Management ?Pharmacy Assistant  ? ?Name: Sonya Small  MRN: [id number] DOB: 10/23/1950 ? ? ?Reason for Encounter: Medication Coordination for [facility]  ?  ?Recent office visits:  ?None ? ?Recent consult visits:  ?None ? ?Hospital visits:  ?None ? ?Medications: ?Outpatient Encounter Medications as of 12/13/2021  ?Medication Sig  ? albuterol (PROVENTIL) (2.5 MG/3ML) 0.083% nebulizer solution Take 2.5 mg by nebulization 4 (four) times daily.  ? albuterol (VENTOLIN HFA) 108 (90 Base) MCG/ACT inhaler Inhale 2 puffs into the lungs every 4 (four) hours as needed.  ? ALPRAZolam (XANAX) 0.5 MG tablet Take 1 tablet (0.5 mg total) by mouth 3 (three) times daily.  ? Aspirin-Acetaminophen (GOODYS BODY PAIN PO) Take 1 packet by mouth daily.  ? BREO ELLIPTA 100-25 MCG/ACT AEPB Inhale 1 puff into the lungs daily.  ? clopidogrel (PLAVIX) 75 MG tablet TAKE ONE TABLET BY MOUTH EVERY MORNING  ? diclofenac Sodium (VOLTAREN) 1 % GEL   ? Dulaglutide (TRULICITY) 1.5 OY/7.7AJ SOPN Inject 1.5 mg into the skin once a week.  ? fenofibrate 160 MG tablet TAKE ONE CAPSULE BY MOUTH EVERY MORNING  ? fluticasone (FLONASE) 50 MCG/ACT nasal spray INSTILL 1 SPRAY IN EACH NOSTRIL ONCE DAILY  ? glucose blood (ONETOUCH VERIO) test strip Use as instructed  ? lamoTRIgine (LAMICTAL) 25 MG tablet Take 2 tablets (50 mg total) by mouth at bedtime.  ? lansoprazole (PREVACID) 30 MG capsule TAKE ONE CAPSULE BY MOUTH BEFORE BREAKFAST  ? Levomilnacipran HCl ER (FETZIMA) 20 MG CP24 Take 20 mg by mouth daily.  ? linaclotide (LINZESS) 145 MCG CAPS capsule Take 1 capsule (145 mcg total) by mouth daily before breakfast. (Patient not taking: Reported on 11/16/2021)  ? lisinopril-hydrochlorothiazide (ZESTORETIC) 20-25 MG tablet TAKE ONE TABLET BY MOUTH ONCE DAILY  ? mupirocin ointment (BACTROBAN) 2 % Apply 1 application topically 2 (two) times daily.  ? nitrofurantoin, macrocrystal-monohydrate, (MACROBID) 100 MG capsule Take 1 capsule (100 mg total) by mouth  2 (two) times daily. (Patient not taking: Reported on 11/16/2021)  ? nitroGLYCERIN (NITROSTAT) 0.4 MG SL tablet Place 1 tablet (0.4 mg total) under the tongue every 5 (five) minutes as needed.  ? nystatin (MYCOSTATIN) 100000 UNIT/ML suspension   ? oxyCODONE-acetaminophen (PERCOCET) 10-325 MG tablet Take 1 tablet by mouth every 6 (six) hours as needed.  ? rosuvastatin (CRESTOR) 20 MG tablet TAKE ONE TABLET BY MOUTH EVERYDAY AT BEDTIME  ? tiZANidine (ZANAFLEX) 4 MG tablet 4 mg as needed. Take 1/2 tablet as needed  ? TRUEplus Lancets 30G MISC 1 each by Does not apply route 2 (two) times daily. E11.69  ? valACYclovir (VALTREX) 1000 MG tablet TAKE TWO TABLETS BY MOUTH twice A DAY FOR ONE DAY as needed  ? VASCEPA 1 g capsule TAKE TWO CAPSULES BY MOUTH TWICE DAILY  ? ?No facility-administered encounter medications on file as of 12/13/2021.  ? ? ?Reviewed chart for medication changes ahead of medication coordination call. ? ?No OVs, Consults, or hospital visits since last care coordination call/Pharmacist visit.  ? ?No medication changes indicated OR if recent visit, treatment plan here. ? ?BP Readings from Last 3 Encounters:  ?10/18/21 140/68  ?09/07/21 136/70  ?07/31/21 105/62  ?  ?Lab Results  ?Component Value Date  ? HGBA1C 6.6 (H) 09/07/2021  ?  ? ?Patient obtains medications through Adherence Packaging  30 Days  ? ?Last adherence delivery included:  ?Mupirocin Ointment 2%  ?Lansoprazole 30 mg 1 at  BB ?Vascepa 1 gm 2 at B 2 at  EM ?Rosuvastatin 20 mg 1 at BT ?Clopidogrel 75 mg 1 at B ?Fenofibrate 160 mg 1 at B ?Fetzima '20mg'$  1 at B ?Lamotrigine 25 mg 2 at BT ?Lisinopril-HCTZ 20-25 mg 1 at B ?Breo Ellipta Inhaler 100-25 mcg 1 puff once daily       ?Ventolin Inhaler 49mg 2 puffs every 4-6 hours as needed   ? ?Patient declined (meds) last month  ?[facility]anax 0.5 mg 3 times daily - [facility] ?Tizanidine 0.5 mg 2 times daily- [facility] not need [facility]  ?Flonase Nasal Spray- Plenty of supply only uses as needed  ?Nitroglycerin  0.4- Has enough. Only uses prn  ?Diclofenac Sodium Gel 1% - Does not want  ?Valtrex 1000 mg two tablets for a day Only uses PRN, has plenty on hand  ?Test Strips and Lancets- [facility] ? ?Patient is due for next adherence delivery on: 12/25/21. ?Called patient and reviewed medications and coordinated delivery. ? ?This delivery to include: ?Mupirocin Ointment 2%  ?Lansoprazole 30 mg 1 at  BB ?Vascepa 1 gm 2 at B 2 at EM ?Rosuvastatin 20 mg 1 at BT ?Clopidogrel 75 mg 1 at B ?Fenofibrate 160 mg 1 at B ?Fetzima '20mg'$  1 at B ?Lamotrigine 25 mg 2 at BT ?Lisinopril-HCTZ 20-25 mg 1 at B ?Breo Ellipta Inhaler 100-25 mcg 1 puff once daily       ?Ventolin Inhaler 938m 2 puffs every 4-6 hours as needed   ?One Touch Test Strips and Lancets ? ?Patient declined the following medications  ?Xanax 0.5 mg 3 times daily - [facility] ?Linaclotide 14563m Received samples from Office  ?Tizanidine 0.5 mg 2 times daily- Does not need [facility]  ?Flonase Nasal Spray- Plenty of supply only uses as needed  ?Nitroglycerin 0.4- Has enough. Only uses prn  ?Diclofenac Sodium Gel 1% - Gets OTC  ?Valtrex 1000 mg two tablets for a day Only uses PRN, has plenty on hand  ?Truclity 1.5-  On back order. [facility] ?Mupirocin Ointment 2% - Only uses prn. Last fill 10/19/21 30ds ? ?Patient needs refills -Refill Request sent  ?Fetzima '20mg'$  ?Truclity 1.5- [facility] sent  ? ?Confirmed delivery date of 12/25/21, advised patient that pharmacy will contact them the morning of delivery. ? ? ?Sonya Small ?Clinical Pharmacist Assistant  ?[phone]

## 2021-12-14 ENCOUNTER — Other Ambulatory Visit: Payer: Self-pay | Admitting: Legal Medicine

## 2021-12-14 MED ORDER — FETZIMA 20 MG PO CP24: 20.0000 mg | ORAL_CAPSULE | Freq: Every day | ORAL | 1 refills | Status: DC

## 2021-12-15 NOTE — Telephone Encounter (Signed)
Compliant on meds, f/u 1 month ?

## 2021-12-19 NOTE — Progress Notes (Signed)
As a reminder Test Strips/Lancets pharmacy cannot bill them for her because they must be billed through part B so we cannot send them for her. This is a note from [place] for future.  ? ?[name], CMA ?Clinical Pharmacist Assistant  ?[phone]   ?

## 2021-12-22 ENCOUNTER — Telehealth: Payer: Self-pay

## 2021-12-22 NOTE — Telephone Encounter (Signed)
Patient called requesting RX to help the burning on the bottom of both feet.  She denies redness and itching.  She states that she wasn't able to take the Lyrica in the past.  [facility] -- please call to let her know if something is sent [phone] (OK to leave VM) ?

## 2021-12-22 NOTE — Telephone Encounter (Signed)
Left message with Ms. Posa to call back about gabapentin.  ?

## 2021-12-25 MED ORDER — GABAPENTIN 100 MG PO CAPS: 100.0000 mg | ORAL_CAPSULE | Freq: Every day | ORAL | 0 refills | Status: DC

## 2021-12-25 NOTE — Telephone Encounter (Signed)
Patient returned call and would like to try Gabapentin 100 mg QHS.  Sent in to pharmacy. ?

## 2021-12-27 ENCOUNTER — Telehealth: Payer: Self-pay

## 2021-12-27 NOTE — Progress Notes (Signed)
? ? ?  Chronic Care Management ?Pharmacy Assistant  ? ?Name: Sonya Small  MRN: [id number] DOB: August 07, 1951 ? ? ?Reason for Encounter: Gap Adherence  ? ?I looked up info on pt Trulicity and she [facility] since [facility] was on [facility] order. She received her last fill on 12/13/21 28ds for 1.'5mg'$ . ?  ?Medications: ?Outpatient Encounter Medications as of 12/27/2021  ?Medication Sig  ? albuterol (PROVENTIL) (2.5 MG/3ML) 0.083% nebulizer solution Take 2.5 mg by nebulization 4 (four) times daily.  ? albuterol (VENTOLIN HFA) 108 (90 Base) MCG/ACT inhaler Inhale 2 puffs into the lungs every 4 (four) hours as needed.  ? ALPRAZolam (XANAX) 0.5 MG tablet Take 1 tablet (0.5 mg total) by mouth 3 (three) times daily.  ? Aspirin-Acetaminophen (GOODYS BODY PAIN PO) Take 1 packet by mouth daily.  ? BREO ELLIPTA 100-25 MCG/ACT AEPB Inhale 1 puff into the lungs daily.  ? clopidogrel (PLAVIX) 75 MG tablet TAKE ONE TABLET BY MOUTH EVERY MORNING  ? diclofenac Sodium (VOLTAREN) 1 % GEL   ? Dulaglutide (TRULICITY) 1.5 NW/2.9FA SOPN Inject 1.5 mg into the skin once a week.  ? fenofibrate 160 MG tablet TAKE ONE CAPSULE BY MOUTH EVERY MORNING  ? fluticasone (FLONASE) 50 MCG/ACT nasal spray INSTILL 1 SPRAY IN EACH NOSTRIL ONCE DAILY  ? gabapentin (NEURONTIN) 100 MG capsule Take 1 capsule (100 mg total) by mouth at bedtime.  ? glucose blood (ONETOUCH VERIO) test strip Use as instructed  ? lamoTRIgine (LAMICTAL) 25 MG tablet Take 2 tablets (50 mg total) by mouth at bedtime.  ? lansoprazole (PREVACID) 30 MG capsule TAKE ONE CAPSULE BY MOUTH BEFORE BREAKFAST  ? Levomilnacipran HCl ER (FETZIMA) 20 MG CP24 Take 20 mg by mouth daily.  ? linaclotide (LINZESS) 145 MCG CAPS capsule Take 1 capsule (145 mcg total) by mouth daily before breakfast. (Patient not taking: Reported on 11/16/2021)  ? lisinopril-hydrochlorothiazide (ZESTORETIC) 20-25 MG tablet TAKE ONE TABLET BY MOUTH ONCE DAILY  ? mupirocin ointment (BACTROBAN) 2 % Apply 1 application  topically 2 (two) times daily.  ? nitrofurantoin, macrocrystal-monohydrate, (MACROBID) 100 MG capsule Take 1 capsule (100 mg total) by mouth 2 (two) times daily. (Patient not taking: Reported on 11/16/2021)  ? nitroGLYCERIN (NITROSTAT) 0.4 MG SL tablet Place 1 tablet (0.4 mg total) under the tongue every 5 (five) minutes as needed.  ? nystatin (MYCOSTATIN) 100000 UNIT/ML suspension   ? oxyCODONE-acetaminophen (PERCOCET) 10-325 MG tablet Take 1 tablet by mouth every 6 (six) hours as needed.  ? rosuvastatin (CRESTOR) 20 MG tablet TAKE ONE TABLET BY MOUTH EVERYDAY AT BEDTIME  ? tiZANidine (ZANAFLEX) 4 MG tablet 4 mg as needed. Take 1/2 tablet as needed  ? TRUEplus Lancets 30G MISC 1 each by Does not apply route 2 (two) times daily. E11.69  ? valACYclovir (VALTREX) 1000 MG tablet TAKE TWO TABLETS BY MOUTH twice A DAY FOR ONE DAY as needed  ? VASCEPA 1 g capsule TAKE TWO CAPSULES BY MOUTH TWICE DAILY  ? ?No facility-administered encounter medications on file as of 12/27/2021.  ? ?[name], CMA ?Clinical Pharmacist Assistant  ?[phone]  ?

## 2022-01-01 ENCOUNTER — Other Ambulatory Visit: Payer: Self-pay | Admitting: Family Medicine

## 2022-01-01 DIAGNOSIS — R5383 Other fatigue: Secondary | ICD-10-CM

## 2022-01-09 ENCOUNTER — Other Ambulatory Visit: Payer: Self-pay | Admitting: Family Medicine

## 2022-01-11 ENCOUNTER — Telehealth: Payer: Self-pay

## 2022-01-11 NOTE — Progress Notes (Signed)
? ? ?Chronic Care Management ?Pharmacy Assistant  ? ?Name: Sonya Small  MRN: [id number] DOB: 01/15/1951 ? ? ?Reason for Encounter: Medication Coordination for [facility]  ?  ?Recent office visits:  ?12/22/21 [name] LPN. Telephone Encounter. Started on Gabapentin '100mg'$  daily at bedtime.  ? ?Recent consult visits:  ?None ? ?Hospital visits:  ?None ? ?Medications: ?Outpatient Encounter Medications as of 01/11/2022  ?Medication Sig  ? albuterol (PROVENTIL) (2.5 MG/3ML) 0.083% nebulizer solution Take 2.5 mg by nebulization 4 (four) times daily.  ? albuterol (VENTOLIN HFA) 108 (90 Base) MCG/ACT inhaler Inhale 2 puffs into the lungs every 4 (four) hours as needed.  ? ALPRAZolam (XANAX) 0.5 MG tablet TAKE 1 TABLET BY MOUTH 3 TIMES DAILY.  ? Aspirin-Acetaminophen (GOODYS BODY PAIN PO) Take 1 packet by mouth daily.  ? BREO ELLIPTA 100-25 MCG/ACT AEPB Inhale 1 puff into the lungs daily.  ? clopidogrel (PLAVIX) 75 MG tablet TAKE ONE TABLET BY MOUTH EVERY MORNING  ? diclofenac Sodium (VOLTAREN) 1 % GEL   ? fenofibrate 160 MG tablet TAKE ONE CAPSULE BY MOUTH EVERY MORNING  ? fluticasone (FLONASE) 50 MCG/ACT nasal spray INSTILL 1 SPRAY IN EACH NOSTRIL ONCE DAILY  ? gabapentin (NEURONTIN) 100 MG capsule Take 1 capsule (100 mg total) by mouth at bedtime.  ? glucose blood (ONETOUCH VERIO) test strip Use as instructed  ? lamoTRIgine (LAMICTAL) 25 MG tablet Take 2 tablets (50 mg total) by mouth at bedtime.  ? lansoprazole (PREVACID) 30 MG capsule TAKE ONE CAPSULE BY MOUTH BEFORE BREAKFAST  ? Levomilnacipran HCl ER (FETZIMA) 20 MG CP24 Take 20 mg by mouth daily.  ? linaclotide (LINZESS) 145 MCG CAPS capsule Take 1 capsule (145 mcg total) by mouth daily before breakfast. (Patient not taking: Reported on 11/16/2021)  ? lisinopril-hydrochlorothiazide (ZESTORETIC) 20-25 MG tablet TAKE ONE TABLET BY MOUTH ONCE DAILY  ? mupirocin ointment (BACTROBAN) 2 % Apply 1 application topically 2 (two) times daily.  ? nitrofurantoin,  macrocrystal-monohydrate, (MACROBID) 100 MG capsule Take 1 capsule (100 mg total) by mouth 2 (two) times daily. (Patient not taking: Reported on 11/16/2021)  ? nitroGLYCERIN (NITROSTAT) 0.4 MG SL tablet Place 1 tablet (0.4 mg total) under the tongue every 5 (five) minutes as needed.  ? nystatin (MYCOSTATIN) 100000 UNIT/ML suspension   ? oxyCODONE-acetaminophen (PERCOCET) 10-325 MG tablet Take 1 tablet by mouth every 6 (six) hours as needed.  ? rosuvastatin (CRESTOR) 20 MG tablet TAKE ONE TABLET BY MOUTH EVERYDAY AT BEDTIME  ? tiZANidine (ZANAFLEX) 4 MG tablet 4 mg as needed. Take 1/2 tablet as needed  ? TRUEplus Lancets 30G MISC 1 each by Does not apply route 2 (two) times daily. E11.69  ? TRULICITY 1.5 PF/7.9KW SOPN INJECT 1.5 MG INTO THE SKIN ONCE A WEEK.  ? valACYclovir (VALTREX) 1000 MG tablet TAKE TWO TABLETS BY MOUTH twice A DAY FOR ONE DAY as needed  ? VASCEPA 1 g capsule TAKE TWO CAPSULES BY MOUTH TWICE DAILY  ? ?No facility-administered encounter medications on file as of 01/11/2022.  ? ? ?Reviewed chart for medication changes ahead of medication coordination call. ? ?No OVs, Consults, or hospital visits since last care coordination call/Pharmacist visit.  ? ?BP Readings from Last 3 Encounters:  ?10/18/21 140/68  ?09/07/21 136/70  ?07/31/21 105/62  ?  ?Lab Results  ?Component Value Date  ? HGBA1C 6.6 (H) 09/07/2021  ?  ? ?Patient obtains medications through Adherence Packaging  30 Days  ? ?Last adherence delivery included:  ?Lansoprazole 30 mg 1 at  BB ?Vascepa 1 gm 2 at B 2 at EM ?Rosuvastatin 20 mg 1 at BT ?Clopidogrel 75 mg 1 at B ?Fenofibrate 160 mg 1 at B ?Fetzima '20mg'$  1 at B ?Lamotrigine 25 mg 2 at BT ?Lisinopril-HCTZ 20-25 mg 1 at B ?Breo Ellipta Inhaler 100-25 mcg 1 puff once daily       ?Ventolin Inhaler 69mg 2 puffs every 4-6 hours as needed   ? ?Patient declined (meds) last month  ?X[facility] 0.5 mg 3 times daily - [facility] ?Linaclotide 1477m-  Received samples from Office  ?Tizanidine 0.5 mg 2 times daily- Does not need [facility]  ?Flonase Nasal Spray- Plenty of supply only uses as needed  ?Nitroglycerin 0.4- Has enough. Only uses prn  ?Diclofenac Sodium Gel 1% - Gets OTC  ?Valtrex 1000 mg two tablets for a day Only uses PRN, has plenty on hand  ?Truclity 1.5-  On back order. [facility] ?Mupirocin Ointment 2% - Only uses prn. Last fill 10/19/21 30ds ? ?Patient is due for next adherence delivery on: 01/24/22. ?Called patient and reviewed medications and coordinated delivery. ? ?This delivery to include: ?Lansoprazole 30 mg 1 at  BB ?Vascepa 1 gm 2 at B 2 at EM ?Rosuvastatin 20 mg 1 at BT ?Clopidogrel 75 mg 1 at B ?Fenofibrate 160 mg 1 at B ?Fetzima '20mg'$  1 at B ?Lamotrigine 25 mg 2 at BT ?Lisinopril-HCTZ 20-25 mg 1 at B ?Breo Ellipta Inhaler 100-25 mcg 1 puff once daily       ?Ventolin Inhaler 9073m2 puffs every 4-6 hours as needed   ?Valtrex 1000 mg two tablets for a day (Bottles) ?Diclofenac Sodium Gel 1%  ? ?Patient declined the following medications  ?Xanax 0.5 mg 3 times daily - [facility] ?Linaclotide 145m61mReceived samples from Office  ?Tizanidine 0.5 mg 2 times daily- [facility]  ?Flonase Nasal Spray- Plenty of supply only uses as needed  ?Nitroglycerin 0.4- Has enough. Only uses prn  ?Truclity 1.5-  On back order. [facility] ?Mupirocin Ointment 2% - Only uses prn. Last fill 10/19/21 30ds ?Test Strips/Lancets- [facility]. Cannot be billed through her insurance  ? ?Patient needs refills  ?Diclofenac Sodium Gel 1%  ? ?Confirmed delivery date of 01/24/22, advised patient that pharmacy will contact them the morning of delivery. ? ?DaniElray McgregorA ?Clinical Pharmacist Assistant  ?[phone]

## 2022-01-12 ENCOUNTER — Other Ambulatory Visit: Payer: Self-pay

## 2022-01-12 ENCOUNTER — Other Ambulatory Visit: Payer: Self-pay | Admitting: Family Medicine

## 2022-01-12 DIAGNOSIS — E1142 Type 2 diabetes mellitus with diabetic polyneuropathy: Secondary | ICD-10-CM

## 2022-01-12 MED ORDER — TRUEPLUS LANCETS 30G MISC: 1.0000 | Freq: Two times a day (BID) | 3 refills | Status: DC

## 2022-01-12 MED ORDER — DICLOFENAC SODIUM 1 % EX GEL: 4.0000 g | Freq: Four times a day (QID) | CUTANEOUS | 0 refills | Status: DC

## 2022-01-24 ENCOUNTER — Telehealth: Payer: Self-pay

## 2022-01-24 ENCOUNTER — Ambulatory Visit (INDEPENDENT_AMBULATORY_CARE_PROVIDER_SITE_OTHER): Payer: Medicare Other | Admitting: Family Medicine

## 2022-01-24 ENCOUNTER — Other Ambulatory Visit: Payer: Self-pay

## 2022-01-24 VITALS — BP 128/78 | HR 96 | Temp 97.8°F | Ht 65.0 in | Wt 158.0 lb

## 2022-01-24 DIAGNOSIS — I119 Hypertensive heart disease without heart failure: Secondary | ICD-10-CM

## 2022-01-24 DIAGNOSIS — J449 Chronic obstructive pulmonary disease, unspecified: Secondary | ICD-10-CM

## 2022-01-24 DIAGNOSIS — E1142 Type 2 diabetes mellitus with diabetic polyneuropathy: Secondary | ICD-10-CM

## 2022-01-24 DIAGNOSIS — F332 Major depressive disorder, recurrent severe without psychotic features: Secondary | ICD-10-CM

## 2022-01-24 DIAGNOSIS — F17219 Nicotine dependence, cigarettes, with unspecified nicotine-induced disorders: Secondary | ICD-10-CM

## 2022-01-24 DIAGNOSIS — T402X5A Adverse effect of other opioids, initial encounter: Secondary | ICD-10-CM

## 2022-01-24 DIAGNOSIS — E781 Pure hyperglyceridemia: Secondary | ICD-10-CM

## 2022-01-24 DIAGNOSIS — E1169 Type 2 diabetes mellitus with other specified complication: Secondary | ICD-10-CM

## 2022-01-24 DIAGNOSIS — K5903 Drug induced constipation: Secondary | ICD-10-CM

## 2022-01-24 DIAGNOSIS — E782 Mixed hyperlipidemia: Secondary | ICD-10-CM | POA: Diagnosis not present

## 2022-01-24 MED ORDER — FUROSEMIDE 20 MG PO TABS: 20.0000 mg | ORAL_TABLET | Freq: Every day | ORAL | 2 refills | Status: DC

## 2022-01-24 MED ORDER — FETZIMA 40 MG PO CP24: 40.0000 mg | ORAL_CAPSULE | Freq: Every day | ORAL | 2 refills | Status: DC

## 2022-01-24 MED ORDER — LEVOMILNACIPRAN HCL ER 20 MG PO CP24: 20.0000 mg | ORAL_CAPSULE | Freq: Two times a day (BID) | ORAL | 2 refills | Status: DC

## 2022-01-24 NOTE — Progress Notes (Signed)
? ?Subjective:  ?Patient ID: Sonya Small, female    DOB: 1950/11/30  Age: 71 y.o. MRN: [id number] ? ?Chief Complaint  ?Patient presents with  ? Diabetes  ? Hyperlipidemia  ? Hypertension  ? ? ?Diabetes:  ?Complications: neuropathy ?Glucose checking: daily. ?Glucose logs: 130-150. ?Hypoglycemia: no ?Most recent A1C: 6.6 ?Current medications: trulicity 3 mg weekly, neurontin 100 mg before bed.  ?Last Eye Exam: ?Foot checks: ? ?Hyperlipidemia: ?Current medications: on fenofibrate 160 mg daily, rosuvastatin 20 mg daily. Vascepa 1 gm 2 capsules twice daily.  ? ?Hypertension: ?Complications: CORONARY ARTERY DISEASE. On plavix 75 mg daily.  ?Current medications: zestoretic 20/25 mg daily, lasix 20 mg daily.  ? ?COPD: on breo 100/25 mcg one inhalation daily, Albuterol hfa or nebs. ? ?Depression: on viibryd 20 mg daily. Lamictal 25 mg 2 before bed, xanax 0.5 mg three times a day. Patient is very depressed because her brother is in hospice and very near to death.  ? ?GERD: on prevacid 30 mg daily  ?Diet: healthy ?Exercise: none ?  ?Current Outpatient Medications on File Prior to Visit  ?Medication Sig Dispense Refill  ? albuterol (PROVENTIL) (2.5 MG/3ML) 0.083% nebulizer solution Take 2.5 mg by nebulization 4 (four) times daily.    ? albuterol (VENTOLIN HFA) 108 (90 Base) MCG/ACT inhaler Inhale 2 puffs into the lungs every 4 (four) hours as needed.    ? ALPRAZolam (XANAX) 0.5 MG tablet TAKE 1 TABLET BY MOUTH 3 TIMES DAILY. 90 tablet 2  ? Aspirin-Acetaminophen (GOODYS BODY PAIN PO) Take 1 packet by mouth daily.    ? BREO ELLIPTA 100-25 MCG/ACT AEPB Inhale 1 puff into the lungs daily.    ? clopidogrel (PLAVIX) 75 MG tablet TAKE ONE TABLET BY MOUTH EVERY MORNING 90 tablet 2  ? diclofenac Sodium (VOLTAREN) 1 % GEL Apply 4 g topically 4 (four) times daily. 1000 g 0  ? fenofibrate 160 MG tablet TAKE ONE CAPSULE BY MOUTH EVERY MORNING 90 tablet 2  ? fluticasone (FLONASE) 50 MCG/ACT nasal spray INSTILL 1 SPRAY IN EACH NOSTRIL ONCE  DAILY 16 g 1  ? gabapentin (NEURONTIN) 100 MG capsule Take 1 capsule (100 mg total) by mouth at bedtime. 30 capsule 0  ? glucose blood (ONETOUCH VERIO) test strip Use as instructed 100 strip 3  ? lamoTRIgine (LAMICTAL) 25 MG tablet Take 2 tablets (50 mg total) by mouth at bedtime. 180 tablet 1  ? lansoprazole (PREVACID) 30 MG capsule TAKE ONE CAPSULE BY MOUTH BEFORE BREAKFAST 90 capsule 2  ? linaclotide (LINZESS) 145 MCG CAPS capsule Take 1 capsule (145 mcg total) by mouth daily before breakfast. 12 capsule 0  ? lisinopril-hydrochlorothiazide (ZESTORETIC) 20-25 MG tablet TAKE ONE TABLET BY MOUTH ONCE DAILY 30 tablet 3  ? montelukast (SINGULAIR) 10 MG tablet Take 10 mg by mouth daily.    ? mupirocin ointment (BACTROBAN) 2 % Apply 1 application topically 2 (two) times daily. 30 g 0  ? nitroGLYCERIN (NITROSTAT) 0.4 MG SL tablet Place 1 tablet (0.4 mg total) under the tongue every 5 (five) minutes as needed. 25 tablet 1  ? nystatin (MYCOSTATIN) 100000 UNIT/ML suspension     ? oxyCODONE-acetaminophen (PERCOCET) 10-325 MG tablet Take 1 tablet by mouth every 6 (six) hours as needed.    ? rosuvastatin (CRESTOR) 20 MG tablet TAKE ONE TABLET BY MOUTH EVERYDAY AT BEDTIME 90 tablet 2  ? tiZANidine (ZANAFLEX) 4 MG tablet 4 mg as needed. Take 1/2 tablet as needed    ? TRUEplus Lancets 30G MISC 1 each  by Does not apply route 2 (two) times daily. E11.69 100 each 3  ? TRULICITY 3 OZ/3.6UY SOPN Inject into the skin.    ? valACYclovir (VALTREX) 1000 MG tablet TAKE TWO TABLETS BY MOUTH twice A DAY FOR ONE DAY as needed 10 tablet 2  ? VASCEPA 1 g capsule TAKE TWO CAPSULES BY MOUTH TWICE DAILY 360 capsule 2  ? ?No current facility-administered medications on file prior to visit.  ? ?Past Medical History:  ?Diagnosis Date  ? Anxiety   ? CAD (coronary artery disease)   ? a. NSTEMI 11/2008 s/p DES to LCx (3.0x12 Xience); b. NSTEMI 01/2010 secondary to thrombotic RCA lesion (non-obstructive)-->med rx (integrilin x 24 hrs + plavix); c. 09/2012  negative Myoview.  ? Chronic diastolic CHF (congestive heart failure) ([place])   ? a. 06/2014 Echo: EF 55-60%, no rwma, Gr1 DD, mild AI.  ? Depression   ? Dizziness   ? Drug induced constipation   ? Ganglion cyst of left foot 01/17/2021  ? Generalized hyperhidrosis   ? GERD (gastroesophageal reflux disease)   ? Headache   ? Hyperlipidemia   ? Hypertensive heart disease   ? Lumbar disc disease   ? Metabolic encephalopathy   ? Mixed hyperlipidemia   ? Myocardial infarction [facility])   ? Obstructive sleep apnea   ? OP (osteoporosis)   ? Osteoarthritis   ? Osteoporosis   ? Other malaise   ? Overweight(278.02)   ? PAD (peripheral artery disease) ([place])   ? a. Emboli to R foot 2010 from partially occlusive lesion in R EIA, s/p stenting. - followed by Dr. Donnetta Hutching;  b. 10/2015 ABIs: R 1.03, L 0.97.  ? Restless leg   ? Sleep apnea   ? Stroke [facility])   ? TIA (transient ischemic attack)   ? Tobacco abuse   ? Urge incontinence   ? ?Past Surgical History:  ?Procedure Laterality Date  ? CYST EXCISION Left 01/2021  ? left foot  ? EYE SURGERY    ? at age 61  ? hysterectomy -age 19    ? ILIAC ARTERY STENT    ? RIGHT ILIAC STENT  ? KNEE ARTHROSCOPY    ? LITHOTRIPSY Left 12/2020  ? LUMBAR LAMINECTOMY    ? TUBAL LIGATION    ?  ?Family History  ?Problem Relation Age of Onset  ? Alzheimer's disease Mother   ? Hodgkin's lymphoma Brother   ? Lung cancer Brother   ? Hepatitis C Brother   ? Hypothyroidism Brother   ? Hypertension Brother   ? Depression Brother   ? Heart disease Other   ?     Grandfather  ? ?Social History  ? ?Socioeconomic History  ? Marital status: Divorced  ?  Spouse name: Not on file  ? Number of children: 3  ? Years of education: 10 th  ? Highest education level: Not on file  ?Occupational History  ? Occupation: DISABLED  ?  Employer: UNEMPLOYED  ?Tobacco Use  ? Smoking status: Every Day  ?  Packs/day: 0.50  ?  Years: 54.00  ?  Pack years: 27.00  ?  Types: Cigarettes  ?  Last attempt to quit: 11/20/2012  ?  Years since quitting: 9.1  ?  Smokeless tobacco: Never  ? Tobacco comments:  ?  2 ppd for many years.   ?Substance and Sexual Activity  ? Alcohol use: No  ?  Alcohol/week: 0.0 standard drinks  ? Drug use: No  ? Sexual activity: Never  ?Other Topics  Concern  ? Not on file  ?Social History Narrative  ? Patient is single with 3 children, 1 deceased.  ? Patient is right handed.  ? Patient has 10 th grade education.  ? Patient drinks 5 or more cups daily.  ? ?Social Determinants of Health  ? ?Financial Resource Strain: High Risk  ? Difficulty of Paying Living Expenses: Hard  ?Food Insecurity: Not on file  ?Transportation Needs: Not on file  ?Physical Activity: Not on file  ?Stress: Not on file  ?Social Connections: Not on file  ? ? ?Review of Systems  ?Constitutional:  Positive for appetite change. Negative for fatigue and fever.  ?HENT:  Negative for congestion, ear pain, sinus pressure and sore throat.   ?Respiratory:  Negative for cough, chest tightness, shortness of breath and wheezing.   ?Cardiovascular:  Positive for leg swelling (Bilateral). Negative for chest pain and palpitations.  ?Gastrointestinal:  Negative for abdominal pain, constipation, diarrhea, nausea and vomiting.  ?Genitourinary:  Negative for dysuria and hematuria.  ?Musculoskeletal:  Negative for arthralgias, back pain, joint swelling and myalgias.  ?Skin:  Negative for rash.  ?Neurological:  Negative for dizziness, weakness and headaches.  ?Psychiatric/Behavioral:  Positive for dysphoric mood. The patient is nervous/anxious.   ? ? ?Objective:  ?BP 128/78 (BP Location: Left Arm, Patient Position: Sitting)   Pulse 96   Temp 97.8 ?F (36.6 ?C) (Temporal)   Ht '5\' 5"'$  (1.651 m)   Wt 158 lb (71.7 kg)   SpO2 99%   BMI 26.29 kg/m?  ? ? ?  01/24/2022  ?  9:15 AM 10/18/2021  ? 10:21 AM 09/07/2021  ? 11:00 AM  ?BP/Weight  ?Systolic BP 096 283 662  ?Diastolic BP 78 68 70  ?Wt. (Lbs) 158 162 159.6  ?BMI 26.29 kg/m2 26.96 kg/m2 26.56 kg/m2  ? ? ?Physical Exam ?Vitals reviewed.   ?Constitutional:   ?   Appearance: Normal appearance. She is normal weight.  ?Neck:  ?   Vascular: No carotid bruit.  ?Cardiovascular:  ?   Rate and Rhythm: Normal rate and regular rhythm.  ?   Pulses: Normal pulses.  ?

## 2022-01-24 NOTE — Telephone Encounter (Signed)
Coordinate with team to get Fetzima '40mg'$  delivered per direct msg from PCP ?

## 2022-01-25 LAB — CBC WITH DIFF/PLATELET
Basophils Absolute: 0 10*3/uL (ref 0.0–0.2)
Basos: 0 %
EOS (ABSOLUTE): 0.1 10*3/uL (ref 0.0–0.4)
Eos: 1 %
Hematocrit: 46.5 % (ref 34.0–46.6)
Hemoglobin: 15.1 g/dL (ref 11.1–15.9)
Immature Grans (Abs): 0.1 10*3/uL (ref 0.0–0.1)
Immature Granulocytes: 1 %
Lymphocytes Absolute: 2.1 10*3/uL (ref 0.7–3.1)
Lymphs: 23 %
MCH: 29 pg (ref 26.6–33.0)
MCHC: 32.5 g/dL (ref 31.5–35.7)
MCV: 89 fL (ref 79–97)
Monocytes Absolute: 0.6 10*3/uL (ref 0.1–0.9)
Monocytes: 6 %
Neutrophils Absolute: 6.4 10*3/uL (ref 1.4–7.0)
Neutrophils: 69 %
Platelets: 270 10*3/uL (ref 150–450)
RBC: 5.2 x10E6/uL (ref 3.77–5.28)
RDW: 12.3 % (ref 11.7–15.4)
WBC: 9.2 10*3/uL (ref 3.4–10.8)

## 2022-01-25 LAB — COMPREHENSIVE METABOLIC PANEL
ALT: 17 IU/L (ref 0–32)
AST: 16 IU/L (ref 0–40)
Albumin/Globulin Ratio: 1.6 (ref 1.2–2.2)
Albumin: 3.9 g/dL (ref 3.7–4.7)
Alkaline Phosphatase: 89 IU/L (ref 44–121)
BUN/Creatinine Ratio: 28 (ref 12–28)
BUN: 19 mg/dL (ref 8–27)
Bilirubin Total: <0.2 mg/dL (ref 0.0–1.2)
CO2: 24 mmol/L (ref 20–29)
Calcium: 9.6 mg/dL (ref 8.7–10.3)
Chloride: 102 mmol/L (ref 96–106)
Creatinine, Ser: 0.67 mg/dL (ref 0.57–1.00)
Globulin, Total: 2.4 g/dL (ref 1.5–4.5)
Glucose: 232 mg/dL — ABNORMAL HIGH (ref 70–99)
Potassium: 4 mmol/L (ref 3.5–5.2)
Sodium: 142 mmol/L (ref 134–144)
Total Protein: 6.3 g/dL (ref 6.0–8.5)
eGFR: 93 mL/min/{1.73_m2} (ref >59)

## 2022-01-25 LAB — CARDIOVASCULAR RISK ASSESSMENT

## 2022-01-25 LAB — LIPID PANEL
Chol/HDL Ratio: 3.2 ratio (ref 0.0–4.4)
Cholesterol, Total: 135 mg/dL (ref 100–199)
HDL: 42 mg/dL (ref >39)
LDL Chol Calc (NIH): 69 mg/dL (ref 0–99)
Triglycerides: 135 mg/dL (ref 0–149)
VLDL Cholesterol Cal: 24 mg/dL (ref 5–40)

## 2022-01-25 LAB — HEMOGLOBIN A1C
Est. average glucose Bld gHb Est-mCnc: 166 mg/dL
Hgb A1c MFr Bld: 7.4 % — ABNORMAL HIGH (ref 4.8–5.6)

## 2022-01-25 LAB — MICROALBUMIN / CREATININE URINE RATIO
Creatinine, Urine: 79.4 mg/dL
Microalb/Creat Ratio: 6 mg/g creat (ref 0–29)
Microalbumin, Urine: 4.7 ug/mL

## 2022-01-25 NOTE — Progress Notes (Signed)
Blood count normal.  ?Liver function normal.  ?Kidney function normal.  ?Cholesterol: good ?HBA1C: 7.4. worsened. Recommend continue same medicines at this time. ?Sugar high at 232.

## 2022-01-28 ENCOUNTER — Encounter: Payer: Self-pay | Admitting: Family Medicine

## 2022-01-28 NOTE — Assessment & Plan Note (Signed)
Recommend cessation. ?

## 2022-01-28 NOTE — Assessment & Plan Note (Signed)
Control: good ?Recommend check sugars fasting daily. ?Recommend check feet daily. ?Recommend annual eye exams. ?Medicines: trulicity 3 mg weekly. Continue gabapentin.  ?Continue to work on eating a healthy diet and exercise.  ?Labs drawn today.   ? ?

## 2022-01-28 NOTE — Assessment & Plan Note (Signed)
The current medical regimen is effective;  continue present plan and medications. ?Continue breo 100/25 mcg one inhalation daily, Albuterol hfa or nebs. ?Recommend quit smoking.  ?

## 2022-01-28 NOTE — Assessment & Plan Note (Signed)
Well controlled.  ?No changes to medicines. Continue on fenofibrate 160 mg daily, rosuvastatin 20 mg daily. Vascepa 1 gm 2 capsules twice daily.  ?Continue to work on eating a healthy diet and exercise.  ?Labs drawn today.  ? ?

## 2022-01-28 NOTE — Assessment & Plan Note (Signed)
Well controlled.  ?No changes to medicines. On plavix 75 mg daily, zestoretic 20/25 mg daily, lasix 20 mg daily. Continue to work on eating a healthy diet and exercise.  ?Labs drawn today.  ? ?

## 2022-01-28 NOTE — Assessment & Plan Note (Signed)
Continue Linzess 145 mg once daily.  ?

## 2022-01-28 NOTE — Assessment & Plan Note (Signed)
Uncontrolled.  ?Increase viibryd 40 mg daily. Lamictal 25 mg 2 before bed, xanax 0.5 mg three times a day.  ?

## 2022-01-31 ENCOUNTER — Other Ambulatory Visit: Payer: Self-pay

## 2022-01-31 ENCOUNTER — Other Ambulatory Visit: Payer: Self-pay | Admitting: Family Medicine

## 2022-02-06 NOTE — Telephone Encounter (Signed)
LVM for pt to call back as soon as possible.  ? ?RE: schedule AWV, no previous recorded.  ?

## 2022-02-12 ENCOUNTER — Telehealth: Payer: Self-pay

## 2022-02-12 NOTE — Progress Notes (Unsigned)
Chronic Care Management Pharmacy Assistant   Name: Sonya Small  MRN: [id number] DOB: 01/29/1951   Reason for Encounter: Medication Coordination for [facility]    Recent office visits:  01/24/22  [name] MD. Seen for routine visit. Started on Furosemide '20mg'$  daily. Increased Dulaglutide to '3mg'$ /0.44m. Started on Levomilnacipran '20mg'$  2 times daily   Recent consult visits:  None  Hospital visits:  None  Medications: Outpatient Encounter Medications as of 02/12/2022  Medication Sig   albuterol (PROVENTIL) (2.5 MG/3ML) 0.083% nebulizer solution Take 2.5 mg by nebulization 4 (four) times daily.   albuterol (VENTOLIN HFA) 108 (90 Base) MCG/ACT inhaler Inhale 2 puffs into the lungs every 4 (four) hours as needed.   ALPRAZolam (XANAX) 0.5 MG tablet TAKE 1 TABLET BY MOUTH 3 TIMES DAILY.   Aspirin-Acetaminophen (GOODYS BODY PAIN PO) Take 1 packet by mouth daily.   BREO ELLIPTA 100-25 MCG/ACT AEPB Inhale 1 puff into the lungs daily.   clopidogrel (PLAVIX) 75 MG tablet TAKE ONE TABLET BY MOUTH EVERY MORNING   diclofenac Sodium (VOLTAREN) 1 % GEL Apply 4 g topically 4 (four) times daily.   fenofibrate 160 MG tablet TAKE ONE CAPSULE BY MOUTH EVERY MORNING   fluticasone (FLONASE) 50 MCG/ACT nasal spray INSTILL 1 SPRAY IN EACH NOSTRIL ONCE DAILY   furosemide (LASIX) 20 MG tablet Take 1 tablet (20 mg total) by mouth daily.   gabapentin (NEURONTIN) 100 MG capsule TAKE 1 CAPSULE BY MOUTH AT BEDTIME.   glucose blood (ONETOUCH VERIO) test strip Use as instructed   lamoTRIgine (LAMICTAL) 25 MG tablet Take 2 tablets (50 mg total) by mouth at bedtime.   lansoprazole (PREVACID) 30 MG capsule TAKE ONE CAPSULE BY MOUTH BEFORE BREAKFAST   Levomilnacipran HCl ER 20 MG CP24 Take 20 mg by mouth 2 (two) times daily.   linaclotide (LINZESS) 145 MCG CAPS capsule Take 1 capsule (145 mcg total) by mouth daily before breakfast.   lisinopril-hydrochlorothiazide (ZESTORETIC) 20-25 MG tablet TAKE ONE TABLET BY MOUTH  ONCE DAILY   montelukast (SINGULAIR) 10 MG tablet Take 10 mg by mouth daily.   mupirocin ointment (BACTROBAN) 2 % Apply 1 application topically 2 (two) times daily.   nitroGLYCERIN (NITROSTAT) 0.4 MG SL tablet Place 1 tablet (0.4 mg total) under the tongue every 5 (five) minutes as needed.   nystatin (MYCOSTATIN) 100000 UNIT/ML suspension    oxyCODONE-acetaminophen (PERCOCET) 10-325 MG tablet Take 1 tablet by mouth every 6 (six) hours as needed.   rosuvastatin (CRESTOR) 20 MG tablet TAKE ONE TABLET BY MOUTH EVERYDAY AT BEDTIME   tiZANidine (ZANAFLEX) 4 MG tablet 4 mg as needed. Take 1/2 tablet as needed   TRUEplus Lancets 30G MISC 1 each by Does not apply route 2 (two) times daily. EV78.58  TRULICITY 3 MIF/0.2DXSOPN Inject into the skin.   valACYclovir (VALTREX) 1000 MG tablet TAKE TWO TABLETS BY MOUTH twice A DAY FOR ONE DAY as needed   VASCEPA 1 g capsule TAKE TWO CAPSULES BY MOUTH TWICE DAILY   No facility-administered encounter medications on file as of 02/12/2022.    Reviewed chart for medication changes ahead of medication coordination call.  No Consults, or hospital visits since last care coordination call/Pharmacist visit.    BP Readings from Last 3 Encounters:  01/24/22 128/78  10/18/21 140/68  09/07/21 136/70    Lab Results  Component Value Date   HGBA1C 7.4 (H) 01/24/2022     Patient obtains medications through Adherence Packaging  30 Days   Last  adherence delivery included:  Lansoprazole 30 mg 1 at  BB Vascepa 1 gm 2 at B 2 at EM Rosuvastatin 20 mg 1 at BT Clopidogrel 75 mg 1 at B Fenofibrate 160 mg 1 at B Fetzima '20mg'$  1 at B Lamotrigine 25 mg 2 at BT Lisinopril-HCTZ 20-25 mg 1 at B [employer] Inhaler 100-25 mcg 1 puff once daily       Ventolin Inhaler 23mg 2 puffs every 4-6 hours as needed   Valtrex 1000 mg two tablets for a day (Bottles) Diclofenac Sodium Gel 1%   Patient declined (meds) last month Xanax 0.5 mg 3 times daily - [facility]. [facility] Linaclotide 1423m- Received samples from Office  Tizanidine 0.5 mg 2 times daily- [facility] not [facility]  Flonase Nasal Spray- Plenty of supply only uses as needed  Nitroglycerin 0.4- Has enough. Only uses prn  Truclity 1.5-  On back order. [facility] Mupirocin Ointment 2% - Only uses prn. Last fill 10/19/21 30ds Test Strips/Lancets- [facility]. Cannot be billed through her insurance   Patient is due for next adherence delivery on: 02/22/22. Called patient and reviewed medications and coordinated delivery.  This delivery to include: Lansoprazole 30 mg 1 at  BB Vascepa 1 gm 2 at B 2 at EM Rosuvastatin 20 mg 1 at BT Clopidogrel 75 mg 1 at B Fenofibrate 160 mg 1 at B Levomilnacipran  '20mg'$  1 at B and 1 EM  Lamotrigine 25 mg 2 at BT Lisinopril-HCTZ 20-25 mg 1 at B Breo Ellipta Inhaler 100-25 mcg 1 puff once daily       Ventolin Inhaler 90101m2 puffs every 4-6 hours as needed   Furosemide '20mg'$  1 a B Trulicity '3mg'$  - inject '3mg'$  into the skin once weekly  Valtrex 1000 mg two tablets for a day (Bottles) Diclofenac Sodium Gel 1%   Patient declined the following medications  X[facility]ax 0.5 mg 3 times daily - [facility]. [facility] Linaclotide 145m10mReceived samples from Office  Tizanidine 0.5 mg 2 times daily- [facility] not [facility]  Flonase Nasal Spray- Plenty of supply only uses as needed  Nitroglycerin 0.4- Has enough. Only uses prn  Mupirocin Ointment 2% - Only uses prn. Last fill 10/19/21 30ds Test Strips/Lancets- [facility] Cannot be billed through her insurance   Patient needs refills  Trulicity '3mg'$  - inject '3mg'$  into the skin once weekly  Lamotrigine 25 mg  Confirmed delivery date of ***, advised patient that pharmacy will contact them the morning of delivery.   DaniElray McgregorA [facility] Ranch[facility] Assistant  [phone]

## 2022-02-14 ENCOUNTER — Other Ambulatory Visit: Payer: Self-pay

## 2022-02-14 DIAGNOSIS — E1142 Type 2 diabetes mellitus with diabetic polyneuropathy: Secondary | ICD-10-CM

## 2022-02-14 MED ORDER — LAMOTRIGINE 25 MG PO TABS: 50.0000 mg | ORAL_TABLET | Freq: Every day | ORAL | 1 refills | Status: DC

## 2022-02-14 MED ORDER — TRULICITY 3 MG/0.5ML ~~LOC~~ SOAJ: 3.0000 mg | SUBCUTANEOUS | 0 refills | Status: DC

## 2022-02-14 NOTE — Telephone Encounter (Signed)
Compliant on meds 

## 2022-02-26 NOTE — Progress Notes (Signed)
Cardiology Office Note    Date:  03/01/2022   ID:  Sonya, Small January 15, 1951, MRN [id number]  PCP:  [name], MD  Cardiologist:  Dr. [name]  Chief Complaint  Patient presents with   Chest Pain   Coronary Artery Disease    History of Present Illness:  Sonya Small is a 71 y.o. female with PMH of HTN, HLD, OSA, PAD, CVA, chronic diastolic heart failure and CAD.  Patient had a NSTEMI in March 2010 and underwent DES to left circumflex.  She had another NSTEMI in May 2011 secondary to thrombotic RCA lesion, cardiac catheterization showed nonobstructive disease, medical therapy was recommended.  She was admitted for recurrent chest pain in January 2014, Myoview was negative.  She was admitted for acute CVA in October 2015 after presenting with slurred speech and facial drooping.  MRI showed acute right MCA infarct involving basal ganglia and periventricular white matter.  She was started on aspirin along with Plavix.  Myoview obtained in August 2017 showed no ischemia, normal EF.   ABI obtained in July 2018 was normal.    She was seen by Almyra Deforest PA-C on 03/13/2018  with chest pain.  A Myoview study on 03/25/2018 which showed EF 52%, small sized moderate intensity fixed apical perfusion defect likely attenuation artifact was seen, no reversible ischemia otherwise.  Overall this is a low risk stress test.  Seen again on 05/22/2018, he was having a lot of dizziness. She was noted to be taking different doses of medication than what was prescribed. BP was stable. Polypharmacy with use of Xanax, oxycodone, Tizanidiine, Zoloft. Dizziness felt to be more related to medication than to a vascular issue. Carotid dopplers were checked and were OK.  She was admitted in May 2020 with PNA and respiratory failure requiring ventilator support. Treated with antibiotics and breathing treatments with improvement. Still smoking.  She was seen in the ED on 10/19/20 with acute mid thoracic pain. Started left scapula  and moved to the right. She states Ntg did help. Troponin was normal x 2. Ecg showed mild ST depression in leads V5-6. CXR normal. Other labs normal. Pain lasted about 30 minutes.  Since then her pain has resolved.    When seen last year she noted that she has had pain across her shoulder blades and upper back for several months. It is worse with activity. She does get relief with pain medication and Ntg.  We repeated a Myoview study and it was low risk. Unchanged from prior. Continued on medical therapy.  When seen today she is having worsening chest pain. She has been under a lot of stress. She was caring for her brother and he died this past month with lung CA. She has complained of chest pain since then. Worse when stressed. Some relief with sl Ntg but she doesn't like to take. She notes pain across her anterior chest and into her back. Doesn't feel well. Has trouble staying awake. Fingers are numb. Notes toes are blue.     Past Medical History:  Diagnosis Date   Anxiety    CAD (coronary artery disease)    a. NSTEMI 11/2008 s/p DES to LCx (3.0x12 Xience); b. NSTEMI 01/2010 secondary to thrombotic RCA lesion (non-obstructive)-->med rx (integrilin x 24 hrs + plavix); c. 09/2012 negative Myoview.   Chronic diastolic CHF (congestive heart failure) ([place])    a. 06/2014 Echo: EF 55-60%, no rwma, Gr1 DD, mild AI.   Depression    Dizziness  Drug induced constipation    Ganglion cyst of left foot 01/17/2021   Generalized hyperhidrosis    GERD (gastroesophageal reflux disease)    Headache    Hyperlipidemia    Hypertensive heart disease    Lumbar disc disease    Metabolic encephalopathy    Mixed hyperlipidemia    Myocardial infarction ([place])    Obstructive sleep apnea    OP (osteoporosis)    Osteoarthritis    Osteoporosis    Other malaise    Overweight(278.02)    PAD (peripheral artery disease) ([place])    a. Emboli to R foot 2010 from partially occlusive lesion in R EIA, s/p stenting. -  followed by Dr. Donnetta Hutching;  b. 10/2015 ABIs: R 1.03, L 0.97.   Restless leg    Sleep apnea    Stroke [facility])    TIA (transient ischemic attack)    Tobacco abuse    Urge incontinence     Past Surgical History:  Procedure Laterality Date   CYST EXCISION Left 01/2021   left foot   EYE SURGERY     at age 89   hysterectomy -age 33     ILIAC ARTERY STENT     RIGHT ILIAC STENT   KNEE ARTHROSCOPY     LITHOTRIPSY Left 12/2020   LUMBAR LAMINECTOMY     TUBAL LIGATION      Current Medications: Outpatient Medications Prior to Visit  Medication Sig Dispense Refill   albuterol (PROVENTIL) (2.5 MG/3ML) 0.083% nebulizer solution Take 2.5 mg by nebulization 4 (four) times daily.     albuterol (VENTOLIN HFA) 108 (90 Base) MCG/ACT inhaler Inhale 2 puffs into the lungs every 4 (four) hours as needed.     ALPRAZolam (XANAX) 0.5 MG tablet TAKE 1 TABLET BY MOUTH 3 TIMES DAILY. 90 tablet 2   Aspirin-Acetaminophen (GOODYS BODY PAIN PO) Take 1 packet by mouth daily.     BREO ELLIPTA 100-25 MCG/ACT AEPB Inhale 1 puff into the lungs daily.     clopidogrel (PLAVIX) 75 MG tablet TAKE ONE TABLET BY MOUTH EVERY MORNING 90 tablet 2   diclofenac Sodium (VOLTAREN) 1 % GEL Apply 4 g topically 4 (four) times daily. 1000 g 0   Dulaglutide (TRULICITY) 3 AO/1.3YQ SOPN Inject 3 mg into the skin once a week. 6 mL 0   fenofibrate 160 MG tablet TAKE ONE CAPSULE BY MOUTH EVERY MORNING 90 tablet 2   fluticasone (FLONASE) 50 MCG/ACT nasal spray INSTILL 1 SPRAY IN EACH NOSTRIL ONCE DAILY 16 g 1   furosemide (LASIX) 20 MG tablet Take 1 tablet (20 mg total) by mouth daily. 30 tablet 2   gabapentin (NEURONTIN) 100 MG capsule TAKE 1 CAPSULE BY MOUTH AT BEDTIME. 90 capsule 1   glucose blood (ONETOUCH VERIO) test strip Use as instructed 100 strip 3   lamoTRIgine (LAMICTAL) 25 MG tablet Take 2 tablets (50 mg total) by mouth at bedtime. 180 tablet 1   lansoprazole (PREVACID) 30 MG capsule TAKE ONE CAPSULE BY MOUTH BEFORE BREAKFAST 90  capsule 2   Levomilnacipran HCl ER 20 MG CP24 Take 20 mg by mouth 2 (two) times daily. 60 capsule 2   linaclotide (LINZESS) 145 MCG CAPS capsule Take 1 capsule (145 mcg total) by mouth daily before breakfast. 12 capsule 0   lisinopril-hydrochlorothiazide (ZESTORETIC) 20-25 MG tablet TAKE ONE TABLET BY MOUTH ONCE DAILY 30 tablet 3   montelukast (SINGULAIR) 10 MG tablet Take 10 mg by mouth daily.     mupirocin ointment (BACTROBAN) 2 % Apply  1 application topically 2 (two) times daily. 30 g 0   nitroGLYCERIN (NITROSTAT) 0.4 MG SL tablet Place 1 tablet (0.4 mg total) under the tongue every 5 (five) minutes as needed. 25 tablet 1   nystatin (MYCOSTATIN) 100000 UNIT/ML suspension      oxyCODONE-acetaminophen (PERCOCET) 10-325 MG tablet Take 1 tablet by mouth every 6 (six) hours as needed.     rosuvastatin (CRESTOR) 20 MG tablet TAKE ONE TABLET BY MOUTH EVERYDAY AT BEDTIME 90 tablet 2   tiZANidine (ZANAFLEX) 4 MG tablet 4 mg as needed. Take 1/2 tablet as needed     TRUEplus Lancets 30G MISC 1 each by Does not apply route 2 (two) times daily. E11.69 100 each 3   valACYclovir (VALTREX) 1000 MG tablet TAKE TWO TABLETS BY MOUTH twice A DAY FOR ONE DAY as needed 10 tablet 2   VASCEPA 1 g capsule TAKE TWO CAPSULES BY MOUTH TWICE DAILY 360 capsule 2   No facility-administered medications prior to visit.     Allergies:   Abilify [aripiprazole] and Latex   Social History   Socioeconomic History   Marital status: Divorced    Spouse name: Not on file   Number of children: 3   Years of education: 10 th   Highest education level: Not on file  Occupational History   Occupation: DISABLED    Employer: UNEMPLOYED  Tobacco Use   Smoking status: Every Day    Packs/day: 0.50    Years: 54.00    Total pack years: 27.00    Types: Cigarettes    Last attempt to quit: 11/20/2012    Years since quitting: 9.2   Smokeless tobacco: Never   Tobacco comments:    2 ppd for many years.   Substance and Sexual  Activity   Alcohol use: No    Alcohol/week: 0.0 standard drinks of alcohol   Drug use: No   Sexual activity: Never  Other Topics Concern   Not on file  Social History Narrative   Patient is single with 3 children, 1 deceased.   Patient is right handed.   Patient has 10 th grade education.   Patient drinks 5 or more cups daily.   Social Determinants of Health   Financial Resource Strain: High Risk (07/03/2021)   Overall Financial Resource Strain (CARDIA)    Difficulty of Paying Living Expenses: Hard  Food Insecurity: Not on file  Transportation Needs: Not on file  Physical Activity: Not on file  Stress: Stress Concern Present (01/19/2020)   [place]    Feeling of Stress : Rather much  Social Connections: Not on file     Family History:  The patient's family history includes Alzheimer's disease in her mother; Depression in her brother; Heart disease in an other family member; Hepatitis C in her brother; Hodgkin's lymphoma in her brother; Hypertension in her brother; Hypothyroidism in her brother; Lung cancer in her brother.   ROS:   Please see the history of present illness.    ROS All other systems reviewed and are negative.   PHYSICAL EXAM:   VS:  BP 120/80   Pulse 82   Ht '5\' 5"'$  (1.651 m)   Wt 161 lb 6.4 oz (73.2 kg)   SpO2 98%   BMI 26.86 kg/m    GEN: Well nourished, well developed, in no acute distress  HEENT: normal  Neck: no JVD, carotid bruits, or masses Cardiac: RRR; no murmurs, rubs, or gallops,no edema. Pedal  pulses are palpable but toes are somewhat dusky on plantar side.  Respiratory:  clear to auscultation bilaterally, normal work of breathing GI: soft, nontender, nondistended, + BS MS: no deformity or atrophy  Skin: warm and dry, no rash Neuro:  Alert and Oriented x 3, Strength and sensation are intact Psych: euthymic mood, full affect  Wt Readings from Last 3 Encounters:  03/01/22 161  lb 6.4 oz (73.2 kg)  01/24/22 158 lb (71.7 kg)  10/18/21 162 lb (73.5 kg)      Studies/Labs Reviewed:   EKG:  EKG is ordered today. NSR rate 82, old septal infarct. No change from prior. I have personally reviewed and interpreted this study.    Recent Labs: 01/24/2022: ALT 17; BUN 19; Creatinine, Ser 0.67; Hemoglobin 15.1; Platelets 270; Potassium 4.0; Sodium 142   Lipid Panel    Component Value Date/Time   CHOL 135 01/24/2022 1010   TRIG 135 01/24/2022 1010   HDL 42 01/24/2022 1010   CHOLHDL 3.2 01/24/2022 1010   CHOLHDL 5.0 07/13/2014 0108   VLDL 42 (H) 07/13/2014 0108   LDLCALC 69 01/24/2022 1010   Labs dated 06/17/18: CBC normal except for elevated WBC 13.3.  Creatinine 1.14. Potassium 5.5.  Dated 05/23/18: cholesterol 142, triglycerides 212, HDL 35, LDL 65.  Dated 07/29/18: Normal chemistries and CBC.  Dated 09/02/19: cholesterol 165, triglycerides 146, HDL 49, LDL 62. A1c 7%. Creatinine 0.56. potassium 3.3. CBC and TSH normal.  Additional studies/ records that were reviewed today include:   Myoview 03/25/2018 The left ventricular ejection fraction is mildly decreased (45-54%). Nuclear stress EF: 52%. No T wave inversion was noted during stress. There was no ST segment deviation noted during stress. Defect 1: There is a small defect of moderate severity. This is a low risk study.   Small size, moderate intensity fixed apical perfusion defect, likely attenuation artifact. No reversible ischemia. LVEF 52% with normal wall motion. This is a low risk study.  Myoview 08/22/21: Study Highlights      Findings are consistent with no prior ischemia. The study is low risk.   No ST deviation was noted.   LV perfusion is abnormal. Defect 1: There is a small defect with mild reduction in uptake present in the apical apex location(s) that is fixed. There is normal wall motion in the defect area. Consistent with artifact caused by breast attenuation.   Left ventricular function is  normal. End diastolic cavity size is normal. End systolic cavity size is normal.   Prior study available for comparison from 03/25/2018. No changes compared to prior study. LVEF 52%, fixed apical attenuation artifact   Small size, mild intensity fixed apical perfusion defect, likely attenuation artifact. No reversible ischemia. LVEF 52% with normal wall motion. This is a low risk study. Compared to a prior study in 2019, there are no changes (the apical perfusion defect was previously reported) - LVEF is unchanged.    ASSESSMENT:    1. Coronary artery disease involving native coronary artery of native heart with angina pectoris ([name])   2. Tobacco abuse   3. Hyperlipidemia, unspecified hyperlipidemia type   4. PAD (peripheral artery disease) ([place])   5. Essential hypertension      PLAN:  In order of problems listed above:  CAD: remote stent of mid LCx in 2010 with 3.0 x 12 DES. In 2011 had NSTEMI related to thrombotic event in distal RCA- nonobstructive. Myoview in November 2022  was normal.  Now with worsening chest pain concerning for USAP.  Continue aspirin and Plavix. Recommend we proceed directly to cardiac cath with possible PCI. The procedure and risks were reviewed including but not limited to death, myocardial infarction, stroke, arrythmias, bleeding, transfusion, emergency surgery, dye allergy, or renal dysfunction. The patient voices understanding and is agreeable to proceed. Will check troponin today  Hypertension: Blood pressure is well controlled.  Carotid artery disease: She has a history of mild carotid artery disease.  Chronic diastolic heart failure: Appears to be euvolemic on physical exam. Continue lasix 20 mg daily  5.   Dyslipidemia. On Crestor and Vascepa. Will update labs.   6.   Tobacco abuse. Recommend smoking cessation.   7.  Possible PAD will check LE arterial dopplers.     Medication Adjustments/Labs and Tests Ordered: Current medicines are reviewed at  length with the patient today.  Concerns regarding medicines are outlined above.  Medication changes, Labs and Tests ordered today are listed in the Patient Instructions below. There are no Patient Instructions on file for this visit.    Signed, Jamara Vary [name], MD  03/01/2022 10:27 AM    [facility] [address], [place], [place]  [address] Phone: [phone]; Fax: [phone]

## 2022-02-26 NOTE — H&P (View-Only) (Signed)
Cardiology Office Note    Date:  03/01/2022   ID:  Sonya Small, Sonya Small 06/17/1951, MRN [id number]  PCP:  [name], MD  Cardiologist:  Dr. [name]  Chief Complaint  Patient presents with   Chest Pain   Coronary Artery Disease    History of Present Illness:  Sonya Small is a 71 y.o. female with PMH of HTN, HLD, OSA, PAD, CVA, chronic diastolic heart failure and CAD.  Patient had a NSTEMI in March 2010 and underwent DES to left circumflex.  She had another NSTEMI in May 2011 secondary to thrombotic RCA lesion, cardiac catheterization showed nonobstructive disease, medical therapy was recommended.  She was admitted for recurrent chest pain in January 2014, Myoview was negative.  She was admitted for acute CVA in October 2015 after presenting with slurred speech and facial drooping.  MRI showed acute right MCA infarct involving basal ganglia and periventricular white matter.  She was started on aspirin along with Plavix.  Myoview obtained in August 2017 showed no ischemia, normal EF.   ABI obtained in July 2018 was normal.    She was seen by Almyra Deforest PA-C on 03/13/2018  with chest pain.  A Myoview study on 03/25/2018 which showed EF 52%, small sized moderate intensity fixed apical perfusion defect likely attenuation artifact was seen, no reversible ischemia otherwise.  Overall this is a low risk stress test.  Seen again on 05/22/2018, he was having a lot of dizziness. She was noted to be taking different doses of medication than what was prescribed. BP was stable. Polypharmacy with use of Xanax, oxycodone, Tizanidiine, Zoloft. Dizziness felt to be more related to medication than to a vascular issue. Carotid dopplers were checked and were OK.  She was admitted in May 2020 with PNA and respiratory failure requiring ventilator support. Treated with antibiotics and breathing treatments with improvement. Still smoking.  She was seen in the ED on 10/19/20 with acute mid thoracic pain. Started left scapula  and moved to the right. She states Ntg did help. Troponin was normal x 2. Ecg showed mild ST depression in leads V5-6. CXR normal. Other labs normal. Pain lasted about 30 minutes.  Since then her pain has resolved.    When seen last year she noted that she has had pain across her shoulder blades and upper back for several months. It is worse with activity. She does get relief with pain medication and Ntg.  We repeated a Myoview study and it was low risk. Unchanged from prior. Continued on medical therapy.  When seen today she is having worsening chest pain. She has been under a lot of stress. She was caring for her brother and he died this past month with lung CA. She has complained of chest pain since then. Worse when stressed. Some relief with sl Ntg but she doesn't like to take. She notes pain across her anterior chest and into her back. Doesn't feel well. Has trouble staying awake. Fingers are numb. Notes toes are blue.     Past Medical History:  Diagnosis Date   Anxiety    CAD (coronary artery disease)    a. NSTEMI 11/2008 s/p DES to LCx (3.0x12 Xience); b. NSTEMI 01/2010 secondary to thrombotic RCA lesion (non-obstructive)-->med rx (integrilin x 24 hrs + plavix); c. 09/2012 negative Myoview.   Chronic diastolic CHF (congestive heart failure) ([place])    a. 06/2014 Echo: EF 55-60%, no rwma, Gr1 DD, mild AI.   Depression    Dizziness  Drug induced constipation    Ganglion cyst of left foot 01/17/2021   Generalized hyperhidrosis    GERD (gastroesophageal reflux disease)    Headache    Hyperlipidemia    Hypertensive heart disease    Lumbar disc disease    Metabolic encephalopathy    Mixed hyperlipidemia    Myocardial infarction ([place])    Obstructive sleep apnea    OP (osteoporosis)    Osteoarthritis    Osteoporosis    Other malaise    Overweight(278.02)    PAD (peripheral artery disease) ([place])    a. Emboli to R foot 2010 from partially occlusive lesion in R EIA, s/p stenting. -  followed by Dr. Donnetta Hutching;  b. 10/2015 ABIs: R 1.03, L 0.97.   Restless leg    Sleep apnea    Stroke [facility])    TIA (transient ischemic attack)    Tobacco abuse    Urge incontinence     Past Surgical History:  Procedure Laterality Date   CYST EXCISION Left 01/2021   left foot   EYE SURGERY     at age 47   hysterectomy -age 44     ILIAC ARTERY STENT     RIGHT ILIAC STENT   KNEE ARTHROSCOPY     LITHOTRIPSY Left 12/2020   LUMBAR LAMINECTOMY     TUBAL LIGATION      Current Medications: Outpatient Medications Prior to Visit  Medication Sig Dispense Refill   albuterol (PROVENTIL) (2.5 MG/3ML) 0.083% nebulizer solution Take 2.5 mg by nebulization 4 (four) times daily.     albuterol (VENTOLIN HFA) 108 (90 Base) MCG/ACT inhaler Inhale 2 puffs into the lungs every 4 (four) hours as needed.     ALPRAZolam (XANAX) 0.5 MG tablet TAKE 1 TABLET BY MOUTH 3 TIMES DAILY. 90 tablet 2   Aspirin-Acetaminophen (GOODYS BODY PAIN PO) Take 1 packet by mouth daily.     BREO ELLIPTA 100-25 MCG/ACT AEPB Inhale 1 puff into the lungs daily.     clopidogrel (PLAVIX) 75 MG tablet TAKE ONE TABLET BY MOUTH EVERY MORNING 90 tablet 2   diclofenac Sodium (VOLTAREN) 1 % GEL Apply 4 g topically 4 (four) times daily. 1000 g 0   Dulaglutide (TRULICITY) 3 PY/0.9XI SOPN Inject 3 mg into the skin once a week. 6 mL 0   fenofibrate 160 MG tablet TAKE ONE CAPSULE BY MOUTH EVERY MORNING 90 tablet 2   fluticasone (FLONASE) 50 MCG/ACT nasal spray INSTILL 1 SPRAY IN EACH NOSTRIL ONCE DAILY 16 g 1   furosemide (LASIX) 20 MG tablet Take 1 tablet (20 mg total) by mouth daily. 30 tablet 2   gabapentin (NEURONTIN) 100 MG capsule TAKE 1 CAPSULE BY MOUTH AT BEDTIME. 90 capsule 1   glucose blood (ONETOUCH VERIO) test strip Use as instructed 100 strip 3   lamoTRIgine (LAMICTAL) 25 MG tablet Take 2 tablets (50 mg total) by mouth at bedtime. 180 tablet 1   lansoprazole (PREVACID) 30 MG capsule TAKE ONE CAPSULE BY MOUTH BEFORE BREAKFAST 90  capsule 2   Levomilnacipran HCl ER 20 MG CP24 Take 20 mg by mouth 2 (two) times daily. 60 capsule 2   linaclotide (LINZESS) 145 MCG CAPS capsule Take 1 capsule (145 mcg total) by mouth daily before breakfast. 12 capsule 0   lisinopril-hydrochlorothiazide (ZESTORETIC) 20-25 MG tablet TAKE ONE TABLET BY MOUTH ONCE DAILY 30 tablet 3   montelukast (SINGULAIR) 10 MG tablet Take 10 mg by mouth daily.     mupirocin ointment (BACTROBAN) 2 % Apply  1 application topically 2 (two) times daily. 30 g 0   nitroGLYCERIN (NITROSTAT) 0.4 MG SL tablet Place 1 tablet (0.4 mg total) under the tongue every 5 (five) minutes as needed. 25 tablet 1   nystatin (MYCOSTATIN) 100000 UNIT/ML suspension      oxyCODONE-acetaminophen (PERCOCET) 10-325 MG tablet Take 1 tablet by mouth every 6 (six) hours as needed.     rosuvastatin (CRESTOR) 20 MG tablet TAKE ONE TABLET BY MOUTH EVERYDAY AT BEDTIME 90 tablet 2   tiZANidine (ZANAFLEX) 4 MG tablet 4 mg as needed. Take 1/2 tablet as needed     TRUEplus Lancets 30G MISC 1 each by Does not apply route 2 (two) times daily. E11.69 100 each 3   valACYclovir (VALTREX) 1000 MG tablet TAKE TWO TABLETS BY MOUTH twice A DAY FOR ONE DAY as needed 10 tablet 2   VASCEPA 1 g capsule TAKE TWO CAPSULES BY MOUTH TWICE DAILY 360 capsule 2   No facility-administered medications prior to visit.     Allergies:   Abilify [aripiprazole] and Latex   Social History   Socioeconomic History   Marital status: Divorced    Spouse name: Not on file   Number of children: 3   Years of education: 10 th   Highest education level: Not on file  Occupational History   Occupation: DISABLED    Employer: UNEMPLOYED  Tobacco Use   Smoking status: Every Day    Packs/day: 0.50    Years: 54.00    Total pack years: 27.00    Types: Cigarettes    Last attempt to quit: 11/20/2012    Years since quitting: 9.2   Smokeless tobacco: Never   Tobacco comments:    2 ppd for many years.   Substance and Sexual  Activity   Alcohol use: No    Alcohol/week: 0.0 standard drinks of alcohol   Drug use: No   Sexual activity: Never  Other Topics Concern   Not on file  Social History Narrative   Patient is single with 3 children, 1 deceased.   Patient is right handed.   Patient has 10 th grade education.   Patient drinks 5 or more cups daily.   Social Determinants of Health   Financial Resource Strain: High Risk (07/03/2021)   Overall Financial Resource Strain (CARDIA)    Difficulty of Paying Living Expenses: Hard  Food Insecurity: Not on file  Transportation Needs: Not on file  Physical Activity: Not on file  Stress: Stress Concern Present (01/19/2020)   [place]    Feeling of Stress : Rather much  Social Connections: Not on file     Family History:  The patient's family history includes Alzheimer's disease in her mother; Depression in her brother; Heart disease in an other family member; Hepatitis C in her brother; Hodgkin's lymphoma in her brother; Hypertension in her brother; Hypothyroidism in her brother; Lung cancer in her brother.   ROS:   Please see the history of present illness.    ROS All other systems reviewed and are negative.   PHYSICAL EXAM:   VS:  BP 120/80   Pulse 82   Ht '5\' 5"'$  (1.651 m)   Wt 161 lb 6.4 oz (73.2 kg)   SpO2 98%   BMI 26.86 kg/m    GEN: Well nourished, well developed, in no acute distress  HEENT: normal  Neck: no JVD, carotid bruits, or masses Cardiac: RRR; no murmurs, rubs, or gallops,no edema. Pedal  pulses are palpable but toes are somewhat dusky on plantar side.  Respiratory:  clear to auscultation bilaterally, normal work of breathing GI: soft, nontender, nondistended, + BS MS: no deformity or atrophy  Skin: warm and dry, no rash Neuro:  Alert and Oriented x 3, Strength and sensation are intact Psych: euthymic mood, full affect  Wt Readings from Last 3 Encounters:  03/01/22 161  lb 6.4 oz (73.2 kg)  01/24/22 158 lb (71.7 kg)  10/18/21 162 lb (73.5 kg)      Studies/Labs Reviewed:   EKG:  EKG is ordered today. NSR rate 82, old septal infarct. No change from prior. I have personally reviewed and interpreted this study.    Recent Labs: 01/24/2022: ALT 17; BUN 19; Creatinine, Ser 0.67; Hemoglobin 15.1; Platelets 270; Potassium 4.0; Sodium 142   Lipid Panel    Component Value Date/Time   CHOL 135 01/24/2022 1010   TRIG 135 01/24/2022 1010   HDL 42 01/24/2022 1010   CHOLHDL 3.2 01/24/2022 1010   CHOLHDL 5.0 07/13/2014 0108   VLDL 42 (H) 07/13/2014 0108   LDLCALC 69 01/24/2022 1010   Labs dated 06/17/18: CBC normal except for elevated WBC 13.3.  Creatinine 1.14. Potassium 5.5.  Dated 05/23/18: cholesterol 142, triglycerides 212, HDL 35, LDL 65.  Dated 07/29/18: Normal chemistries and CBC.  Dated 09/02/19: cholesterol 165, triglycerides 146, HDL 49, LDL 62. A1c 7%. Creatinine 0.56. potassium 3.3. CBC and TSH normal.  Additional studies/ records that were reviewed today include:   Myoview 03/25/2018 The left ventricular ejection fraction is mildly decreased (45-54%). Nuclear stress EF: 52%. No T wave inversion was noted during stress. There was no ST segment deviation noted during stress. Defect 1: There is a small defect of moderate severity. This is a low risk study.   Small size, moderate intensity fixed apical perfusion defect, likely attenuation artifact. No reversible ischemia. LVEF 52% with normal wall motion. This is a low risk study.  Myoview 08/22/21: Study Highlights      Findings are consistent with no prior ischemia. The study is low risk.   No ST deviation was noted.   LV perfusion is abnormal. Defect 1: There is a small defect with mild reduction in uptake present in the apical apex location(s) that is fixed. There is normal wall motion in the defect area. Consistent with artifact caused by breast attenuation.   Left ventricular function is  normal. End diastolic cavity size is normal. End systolic cavity size is normal.   Prior study available for comparison from 03/25/2018. No changes compared to prior study. LVEF 52%, fixed apical attenuation artifact   Small size, mild intensity fixed apical perfusion defect, likely attenuation artifact. No reversible ischemia. LVEF 52% with normal wall motion. This is a low risk study. Compared to a prior study in 2019, there are no changes (the apical perfusion defect was previously reported) - LVEF is unchanged.    ASSESSMENT:    1. Coronary artery disease involving native coronary artery of native heart with angina pectoris ([place])   2. Tobacco abuse   3. Hyperlipidemia, unspecified hyperlipidemia type   4. PAD (peripheral artery disease) ([place])   5. Essential hypertension      PLAN:  In order of problems listed above:  CAD: remote stent of mid LCx in 2010 with 3.0 x 12 DES. In 2011 had NSTEMI related to thrombotic event in distal RCA- nonobstructive. Myoview in November 2022  was normal.  Now with worsening chest pain concerning for USAP.  Continue aspirin and Plavix. Recommend we proceed directly to cardiac cath with possible PCI. The procedure and risks were reviewed including but not limited to death, myocardial infarction, stroke, arrythmias, bleeding, transfusion, emergency surgery, dye allergy, or renal dysfunction. The patient voices understanding and is agreeable to proceed. Will check troponin today  Hypertension: Blood pressure is well controlled.  Carotid artery disease: She has a history of mild carotid artery disease.  Chronic diastolic heart failure: Appears to be euvolemic on physical exam. Continue lasix 20 mg daily  5.   Dyslipidemia. On Crestor and Vascepa. Will update labs.   6.   Tobacco abuse. Recommend smoking cessation.   7.  Possible PAD will check LE arterial dopplers.     Medication Adjustments/Labs and Tests Ordered: Current medicines are reviewed at  length with the patient today.  Concerns regarding medicines are outlined above.  Medication changes, Labs and Tests ordered today are listed in the Patient Instructions below. There are no Patient Instructions on file for this visit.    Signed, Kasumi Ditullio [name], MD  03/01/2022 10:27 AM    [facility] [address], [place], [place]  [address] Phone: [phone]; Fax: [phone]

## 2022-03-01 ENCOUNTER — Encounter: Payer: Self-pay | Admitting: Cardiology

## 2022-03-01 ENCOUNTER — Ambulatory Visit (INDEPENDENT_AMBULATORY_CARE_PROVIDER_SITE_OTHER): Payer: Medicare Other | Admitting: Cardiology

## 2022-03-01 ENCOUNTER — Other Ambulatory Visit: Payer: Self-pay | Admitting: Cardiology

## 2022-03-01 VITALS — BP 120/80 | HR 82 | Ht 65.0 in | Wt 161.4 lb

## 2022-03-01 DIAGNOSIS — I10 Essential (primary) hypertension: Secondary | ICD-10-CM

## 2022-03-01 DIAGNOSIS — Z720 Tobacco use: Secondary | ICD-10-CM

## 2022-03-01 DIAGNOSIS — I25119 Atherosclerotic heart disease of native coronary artery with unspecified angina pectoris: Secondary | ICD-10-CM

## 2022-03-01 DIAGNOSIS — E785 Hyperlipidemia, unspecified: Secondary | ICD-10-CM | POA: Diagnosis not present

## 2022-03-01 DIAGNOSIS — I739 Peripheral vascular disease, unspecified: Secondary | ICD-10-CM

## 2022-03-01 DIAGNOSIS — I200 Unstable angina: Secondary | ICD-10-CM

## 2022-03-01 LAB — SPECIMEN STATUS REPORT

## 2022-03-01 LAB — TROPONIN T: Troponin T (Highly Sensitive): 9 ng/L (ref 0–14)

## 2022-03-01 MED ORDER — SODIUM CHLORIDE 0.9% FLUSH: 3.0000 mL | Freq: Two times a day (BID) | INTRAVENOUS | Status: DC

## 2022-03-01 NOTE — Patient Instructions (Addendum)
  [place] [address] Dept: [phone] Loc: [phone]  [name]  03/01/2022  You are scheduled for a Cardiac Catheterization on Monday, June 12 with Dr. Larae Grooms.  1. Please arrive at the Main Entrance A at [facility]: [place], [place] [address] at 7:00 AM (This time is two hours before your procedure to ensure your preparation). Free valet parking service is available.   Special note: Every effort is made to have your procedure done on time. Please understand that emergencies sometimes delay scheduled procedures.  2. Diet: Do not eat solid foods after midnight.  You may have clear liquids until 5 AM upon the day of the procedure.  3. Labs: Today in office  4. Medication instructions in preparation for your procedure:   Contrast Allergy: No  No Lasix morning of procedure No lisinopril/hctz morning of procedure   On the morning of your procedure, take Plavix/Clopidogrel and any morning medicines NOT listed above.  You may use sips of water.  5. Plan to go home the same day, you will only stay overnight if medically necessary. 6. You MUST have a responsible adult to drive you home. 7. An adult MUST be with you the first 24 hours after you arrive home. 8. Bring a current list of your medications, and the last time and date medication taken. 9. Bring ID and current insurance cards. 10.Please wear clothes that are easy to get on and off and wear slip-on shoes.  Thank you for allowing [place] to care for you!   -- [facility]   Your physician has requested that you have a lower extremity arterial duplex. This test is an ultrasound of the arteries in the legs. It looks at arterial blood flow in the legs. Allow one hour for Lower Arterial scans. There are no restrictions or special  instructions   Follow up after cath with Dr. [name]

## 2022-03-05 ENCOUNTER — Ambulatory Visit (HOSPITAL_COMMUNITY)
Admission: RE | Admit: 2022-03-05 | Discharge: 2022-03-05 | Disposition: A | Discharge status: Home / Self Care | Payer: Medicare Other | Attending: Interventional Cardiology | Admitting: Interventional Cardiology

## 2022-03-05 ENCOUNTER — Encounter (HOSPITAL_COMMUNITY): Payer: Self-pay | Admitting: Interventional Cardiology

## 2022-03-05 ENCOUNTER — Encounter (HOSPITAL_COMMUNITY)
Admission: RE | Disposition: A | Discharge status: Home / Self Care | Payer: Self-pay | Source: Home / Self Care | Attending: Interventional Cardiology

## 2022-03-05 DIAGNOSIS — I739 Peripheral vascular disease, unspecified: Secondary | ICD-10-CM | POA: Insufficient documentation

## 2022-03-05 DIAGNOSIS — E785 Hyperlipidemia, unspecified: Secondary | ICD-10-CM | POA: Insufficient documentation

## 2022-03-05 DIAGNOSIS — Z79899 Other long term (current) drug therapy: Secondary | ICD-10-CM | POA: Insufficient documentation

## 2022-03-05 DIAGNOSIS — I25110 Atherosclerotic heart disease of native coronary artery with unstable angina pectoris: Secondary | ICD-10-CM | POA: Insufficient documentation

## 2022-03-05 DIAGNOSIS — I110 Hypertensive heart disease with heart failure: Secondary | ICD-10-CM | POA: Insufficient documentation

## 2022-03-05 DIAGNOSIS — I252 Old myocardial infarction: Secondary | ICD-10-CM | POA: Insufficient documentation

## 2022-03-05 DIAGNOSIS — Z7902 Long term (current) use of antithrombotics/antiplatelets: Secondary | ICD-10-CM | POA: Diagnosis not present

## 2022-03-05 DIAGNOSIS — Z8673 Personal history of transient ischemic attack (TIA), and cerebral infarction without residual deficits: Secondary | ICD-10-CM | POA: Insufficient documentation

## 2022-03-05 DIAGNOSIS — Z7982 Long term (current) use of aspirin: Secondary | ICD-10-CM | POA: Insufficient documentation

## 2022-03-05 DIAGNOSIS — Z955 Presence of coronary angioplasty implant and graft: Secondary | ICD-10-CM | POA: Insufficient documentation

## 2022-03-05 DIAGNOSIS — G4733 Obstructive sleep apnea (adult) (pediatric): Secondary | ICD-10-CM | POA: Diagnosis not present

## 2022-03-05 DIAGNOSIS — I5032 Chronic diastolic (congestive) heart failure: Secondary | ICD-10-CM | POA: Insufficient documentation

## 2022-03-05 DIAGNOSIS — I200 Unstable angina: Secondary | ICD-10-CM

## 2022-03-05 DIAGNOSIS — F17210 Nicotine dependence, cigarettes, uncomplicated: Secondary | ICD-10-CM | POA: Diagnosis not present

## 2022-03-05 HISTORY — PX: LEFT HEART CATH AND CORONARY ANGIOGRAPHY: CATH118249

## 2022-03-05 LAB — GLUCOSE, CAPILLARY
Glucose-Capillary: 147 mg/dL — ABNORMAL HIGH (ref 70–99)
Glucose-Capillary: 134 mg/dL — ABNORMAL HIGH (ref 70–99)

## 2022-03-05 SURGERY — LEFT HEART CATH AND CORONARY ANGIOGRAPHY
Anesthesia: LOCAL

## 2022-03-05 MED ORDER — ONDANSETRON HCL 4 MG/2ML IJ SOLN: 4.0000 mg | Freq: Four times a day (QID) | INTRAMUSCULAR | Status: DC | PRN

## 2022-03-05 MED ORDER — HEPARIN SODIUM (PORCINE) 1000 UNIT/ML IJ SOLN
INTRAMUSCULAR | Status: AC
  Filled 2022-03-05: qty 10

## 2022-03-05 MED ORDER — MIDAZOLAM HCL 2 MG/2ML IJ SOLN
INTRAMUSCULAR | Status: AC
  Filled 2022-03-05: qty 2

## 2022-03-05 MED ORDER — ASPIRIN 81 MG PO CHEW
81.0000 mg | CHEWABLE_TABLET | ORAL | Status: AC
  Administered 2022-03-05: 81 mg via ORAL
  Filled 2022-03-05: qty 1

## 2022-03-05 MED ORDER — SODIUM CHLORIDE 0.9 % WEIGHT BASED INFUSION: 1.0000 mL/kg/h | INTRAVENOUS | Status: DC

## 2022-03-05 MED ORDER — LABETALOL HCL 5 MG/ML IV SOLN: 10.0000 mg | INTRAVENOUS | Status: DC | PRN

## 2022-03-05 MED ORDER — SODIUM CHLORIDE 0.9 % IV SOLN: 250.0000 mL | INTRAVENOUS | Status: DC | PRN

## 2022-03-05 MED ORDER — CLOPIDOGREL BISULFATE 75 MG PO TABS
75.0000 mg | ORAL_TABLET | Freq: Once | ORAL | Status: AC
  Administered 2022-03-05: 75 mg via ORAL
  Filled 2022-03-05: qty 1

## 2022-03-05 MED ORDER — VERAPAMIL HCL 2.5 MG/ML IV SOLN
INTRAVENOUS | Status: DC | PRN
  Administered 2022-03-05: 10 mL via INTRA_ARTERIAL

## 2022-03-05 MED ORDER — HYDRALAZINE HCL 20 MG/ML IJ SOLN: 10.0000 mg | INTRAMUSCULAR | Status: DC | PRN

## 2022-03-05 MED ORDER — FENTANYL CITRATE (PF) 100 MCG/2ML IJ SOLN
INTRAMUSCULAR | Status: DC | PRN
  Administered 2022-03-05 (×2): 25 ug via INTRAVENOUS

## 2022-03-05 MED ORDER — SODIUM CHLORIDE 0.9 % IV SOLN: INTRAVENOUS | Status: AC

## 2022-03-05 MED ORDER — MIDAZOLAM HCL 2 MG/2ML IJ SOLN
INTRAMUSCULAR | Status: DC | PRN
  Administered 2022-03-05: 2 mg via INTRAVENOUS
  Administered 2022-03-05: 1 mg via INTRAVENOUS

## 2022-03-05 MED ORDER — SODIUM CHLORIDE 0.9 % WEIGHT BASED INFUSION
3.0000 mL/kg/h | INTRAVENOUS | Status: AC
  Administered 2022-03-05: 3 mL/kg/h via INTRAVENOUS

## 2022-03-05 MED ORDER — VERAPAMIL HCL 2.5 MG/ML IV SOLN
INTRAVENOUS | Status: AC
  Filled 2022-03-05: qty 2

## 2022-03-05 MED ORDER — SODIUM CHLORIDE 0.9% FLUSH: 3.0000 mL | Freq: Two times a day (BID) | INTRAVENOUS | Status: DC

## 2022-03-05 MED ORDER — SODIUM CHLORIDE 0.9% FLUSH: 3.0000 mL | INTRAVENOUS | Status: DC | PRN

## 2022-03-05 MED ORDER — FENTANYL CITRATE (PF) 100 MCG/2ML IJ SOLN
INTRAMUSCULAR | Status: AC
  Filled 2022-03-05: qty 2

## 2022-03-05 MED ORDER — HEPARIN SODIUM (PORCINE) 1000 UNIT/ML IJ SOLN
INTRAMUSCULAR | Status: DC | PRN
  Administered 2022-03-05: 3500 [IU] via INTRAVENOUS

## 2022-03-05 MED ORDER — ACETAMINOPHEN 325 MG PO TABS
650.0000 mg | ORAL_TABLET | ORAL | Status: DC | PRN
Start: 2022-03-05 — End: 2022-03-05

## 2022-03-05 MED ORDER — IOHEXOL 350 MG/ML SOLN
INTRAVENOUS | Status: DC | PRN
  Administered 2022-03-05: 45 mL

## 2022-03-05 MED ORDER — LIDOCAINE HCL (PF) 1 % IJ SOLN
INTRAMUSCULAR | Status: DC | PRN
  Administered 2022-03-05: 2 mL

## 2022-03-05 MED ORDER — HEPARIN (PORCINE) IN NACL 1000-0.9 UT/500ML-% IV SOLN
INTRAVENOUS | Status: DC | PRN
  Administered 2022-03-05 (×2): 500 mL

## 2022-03-05 MED ORDER — HEPARIN (PORCINE) IN NACL 1000-0.9 UT/500ML-% IV SOLN
INTRAVENOUS | Status: AC
  Filled 2022-03-05: qty 1000

## 2022-03-05 MED ORDER — LIDOCAINE HCL (PF) 1 % IJ SOLN
INTRAMUSCULAR | Status: AC
  Filled 2022-03-05: qty 30

## 2022-03-05 SURGICAL SUPPLY — 10 items
BAND ZEPHYR COMPRESS 30 LONG (HEMOSTASIS) ×1 IMPLANT
CATH 5FR JL3.5 JR4 ANG PIG MP (CATHETERS) ×1 IMPLANT
GLIDESHEATH SLEND SS 6F .021 (SHEATH) ×1 IMPLANT
GUIDEWIRE INQWIRE 1.5J.035X260 (WIRE) IMPLANT
INQWIRE 1.5J .035X260CM (WIRE) ×2
KIT HEART LEFT (KITS) ×2 IMPLANT
PACK CARDIAC CATHETERIZATION (CUSTOM PROCEDURE TRAY) ×2 IMPLANT
SHEATH PROBE COVER 6X72 (BAG) ×1 IMPLANT
TRANSDUCER W/STOPCOCK (MISCELLANEOUS) ×2 IMPLANT
TUBING CIL FLEX 10 FLL-RA (TUBING) ×2 IMPLANT

## 2022-03-05 NOTE — Interval H&P Note (Signed)
Cath Lab Visit (complete for each Cath Lab visit)  Clinical Evaluation Leading to the Procedure:   ACS: No.  Non-ACS:    Anginal Classification: CCS III  Anti-ischemic medical therapy: Minimal Therapy (1 class of medications)  Non-Invasive Test Results: No non-invasive testing performed  Prior CABG: No previous CABG      History and Physical Interval Note:  03/05/2022 8:40 AM  Sonya Small  has presented today for surgery, with the diagnosis of chest pain.  The various methods of treatment have been discussed with the patient and family. After consideration of risks, benefits and other options for treatment, the patient has consented to  Procedure(s): LEFT HEART CATH AND CORONARY ANGIOGRAPHY (N/A) as a surgical intervention.  The patient's history has been reviewed, patient examined, no change in status, stable for surgery.  I have reviewed the patient's chart and labs.  Questions were answered to the patient's satisfaction.     Larae Grooms

## 2022-03-08 ENCOUNTER — Telehealth: Payer: Self-pay

## 2022-03-08 ENCOUNTER — Other Ambulatory Visit: Payer: Self-pay | Admitting: Physician Assistant

## 2022-03-08 NOTE — Telephone Encounter (Signed)
Discussed with patient

## 2022-03-08 NOTE — Telephone Encounter (Signed)
Patient called very upset. This week she has received a package of multiple home covid tests, another package including CGMs, and another package including some form of back brace. Patient is very upset as she does not need this nor did she ask for these. She is unsure how these things were sent to her/ordered. She has reached out to medicare and they keep telling her she can keep them as they are free and to help her. Patient is unsure what to do from here.   Royce Macadamia, [place] 03/08/22 1:49 PM

## 2022-03-13 ENCOUNTER — Other Ambulatory Visit: Payer: Self-pay

## 2022-03-13 ENCOUNTER — Telehealth: Payer: Self-pay

## 2022-03-13 DIAGNOSIS — I119 Hypertensive heart disease without heart failure: Secondary | ICD-10-CM

## 2022-03-13 LAB — CBC
Hematocrit: 43.5 % (ref 34.0–46.6)
Hemoglobin: 14.4 g/dL (ref 11.1–15.9)
MCH: 29 pg (ref 26.6–33.0)
MCHC: 33.1 g/dL (ref 31.5–35.7)
MCV: 88 fL (ref 79–97)
Platelets: 272 10*3/uL (ref 150–450)
RBC: 4.97 x10E6/uL (ref 3.77–5.28)
RDW: 12.5 % (ref 11.7–15.4)
WBC: 10.3 10*3/uL (ref 3.4–10.8)

## 2022-03-13 LAB — COMPREHENSIVE METABOLIC PANEL
ALT: 19 IU/L (ref 0–32)
AST: 20 IU/L (ref 0–40)
Albumin/Globulin Ratio: 1.7 (ref 1.2–2.2)
Albumin: 4.3 g/dL (ref 3.7–4.7)
Alkaline Phosphatase: 86 IU/L (ref 44–121)
BUN/Creatinine Ratio: 35 — ABNORMAL HIGH (ref 12–28)
BUN: 31 mg/dL — ABNORMAL HIGH (ref 8–27)
Bilirubin Total: 0.2 mg/dL (ref 0.0–1.2)
CO2: 27 mmol/L (ref 20–29)
Calcium: 9.9 mg/dL (ref 8.7–10.3)
Chloride: 99 mmol/L (ref 96–106)
Creatinine, Ser: 0.88 mg/dL (ref 0.57–1.00)
Globulin, Total: 2.6 g/dL (ref 1.5–4.5)
Glucose: 92 mg/dL (ref 70–99)
Potassium: 4.4 mmol/L (ref 3.5–5.2)
Sodium: 140 mmol/L (ref 134–144)
Total Protein: 6.9 g/dL (ref 6.0–8.5)
eGFR: 70 mL/min/{1.73_m2} (ref >59)

## 2022-03-13 LAB — LIPID PANEL
Chol/HDL Ratio: 3.6 ratio (ref 0.0–4.4)
Cholesterol, Total: 147 mg/dL (ref 100–199)
HDL: 41 mg/dL (ref >39)
LDL Chol Calc (NIH): 82 mg/dL (ref 0–99)
Triglycerides: 134 mg/dL (ref 0–149)
VLDL Cholesterol Cal: 24 mg/dL (ref 5–40)

## 2022-03-13 MED ORDER — CLOPIDOGREL BISULFATE 75 MG PO TABS: 75.0000 mg | ORAL_TABLET | Freq: Every morning | ORAL | 2 refills | Status: DC

## 2022-03-13 MED ORDER — ROSUVASTATIN CALCIUM 20 MG PO TABS
ORAL_TABLET | ORAL | 2 refills | Status: DC
Start: 2022-03-13 — End: 2022-12-06

## 2022-03-13 MED ORDER — FENOFIBRATE 160 MG PO TABS: ORAL_TABLET | ORAL | 2 refills | Status: DC

## 2022-03-13 MED ORDER — LISINOPRIL-HYDROCHLOROTHIAZIDE 20-25 MG PO TABS: 1.0000 | ORAL_TABLET | Freq: Every day | ORAL | 3 refills | Status: DC

## 2022-03-13 NOTE — Telephone Encounter (Signed)
Compliant on meds 

## 2022-03-13 NOTE — Progress Notes (Signed)
Chronic Care Management Pharmacy Assistant   Name: Sonya Small  MRN: [id number] DOB: 17-Oct-1950   Reason for Encounter: Medication Coordination for [facility]    Recent office visits:  None  Recent consult visits:  03/01/22 (Cardiology) [name], [name] MD. Seen for CAD. No med changes.  Hospital visits:  Medication Reconciliation was completed by comparing discharge summary, patient's EMR and Pharmacy list, and upon discussion with patient.  Admitted to the hospital on 03/05/22 due to Left Heart Cath and Coronary Angiography. Discharge date was 03/05/22. Discharged from [facility] other medications will remain the same.    Medications: Outpatient Encounter Medications as of 03/13/2022  Medication Sig   albuterol (PROVENTIL) (2.5 MG/3ML) 0.083% nebulizer solution Take 2.5 mg by nebulization every 6 (six) hours as needed for wheezing or shortness of breath.   albuterol (VENTOLIN HFA) 108 (90 Base) MCG/ACT inhaler Inhale 2 puffs into the lungs every 4 (four) hours as needed for wheezing or shortness of breath.   ALPRAZolam (XANAX) 0.5 MG tablet TAKE 1 TABLET BY MOUTH 3 TIMES DAILY.   Aspirin-Acetaminophen (GOODYS BODY PAIN PO) Take 1 packet by mouth daily.   BREO ELLIPTA 100-25 MCG/ACT AEPB Inhale 1 puff into the lungs daily.   clopidogrel (PLAVIX) 75 MG tablet TAKE ONE TABLET BY MOUTH EVERY MORNING   diclofenac Sodium (VOLTAREN) 1 % GEL Apply 4 g topically 4 (four) times daily. (Patient taking differently: Apply 4 g topically daily as needed (Hip and leg pain).)   Dulaglutide (TRULICITY) 3 HG/9.9ME SOPN Inject 3 mg into the skin once a week. (Patient taking differently: Inject 3 mg into the skin every Wednesday.)   fenofibrate 160 MG tablet TAKE ONE CAPSULE BY MOUTH EVERY MORNING   fluticasone (FLONASE) 50 MCG/ACT nasal spray INSTILL 1 SPRAY IN EACH NOSTRIL ONCE DAILY (Patient taking differently: Place 1 spray into both nostrils daily as needed for rhinitis.)    furosemide (LASIX) 20 MG tablet Take 1 tablet (20 mg total) by mouth daily.   gabapentin (NEURONTIN) 100 MG capsule TAKE 1 CAPSULE BY MOUTH AT BEDTIME.   glucose blood (ONETOUCH VERIO) test strip Use as instructed   lamoTRIgine (LAMICTAL) 25 MG tablet Take 2 tablets (50 mg total) by mouth at bedtime.   lansoprazole (PREVACID) 30 MG capsule TAKE ONE CAPSULE BY MOUTH BEFORE BREAKFAST   Levomilnacipran HCl ER 20 MG CP24 Take 20 mg by mouth 2 (two) times daily.   linaclotide (LINZESS) 145 MCG CAPS capsule Take 1 capsule (145 mcg total) by mouth daily before breakfast.   lisinopril-hydrochlorothiazide (ZESTORETIC) 20-25 MG tablet TAKE ONE TABLET BY MOUTH ONCE DAILY   montelukast (SINGULAIR) 10 MG tablet Take 10 mg by mouth daily.   mupirocin ointment (BACTROBAN) 2 % Apply 1 application topically 2 (two) times daily. (Patient taking differently: Apply 1 application  topically daily as needed (Rash).)   nitroGLYCERIN (NITROSTAT) 0.4 MG SL tablet Place 1 tablet (0.4 mg total) under the tongue every 5 (five) minutes as needed.   oxyCODONE-acetaminophen (PERCOCET) 10-325 MG tablet Take 1 tablet by mouth every 6 (six) hours as needed for pain.   rosuvastatin (CRESTOR) 20 MG tablet TAKE ONE TABLET BY MOUTH EVERYDAY AT BEDTIME   tiZANidine (ZANAFLEX) 4 MG tablet Take 2 mg by mouth daily as needed for muscle spasms.   TRUEplus Lancets 30G MISC 1 each by Does not apply route 2 (two) times daily. E11.69   valACYclovir (VALTREX) 1000 MG tablet TAKE TWO TABLETS BY  MOUTH twice A DAY FOR ONE DAY as needed (Patient taking differently: Take 500-1,000 mg by mouth daily as needed (Fever blister).)   VASCEPA 1 g capsule TAKE TWO CAPSULES BY MOUTH TWICE DAILY   Facility-Administered Encounter Medications as of 03/13/2022  Medication   sodium chloride flush (NS) 0.9 % injection 3 mL    Reviewed chart for medication changes ahead of medication coordination call.  No Ovs since last care coordination call/Pharmacist  visit.   No medication changes indicated OR if recent visit, treatment plan here.  BP Readings from Last 3 Encounters:  03/05/22 (!) 153/76  03/01/22 120/80  01/24/22 128/78    Lab Results  Component Value Date   HGBA1C 7.4 (H) 01/24/2022     Patient obtains medications through Adherence Packaging  30 Days   Last adherence delivery included:  Lansoprazole 30 mg 1 at  BB Vascepa 1 gm 2 at B 2 at EM Rosuvastatin 20 mg 1 at BT Clopidogrel 75 mg 1 at B Fenofibrate 160 mg 1 at B Levomilnacipran  '20mg'$  1 at B and 1 EM  Lamotrigine 25 mg 2 at BT Lisinopril-HCTZ 20-25 mg 1 at B     Ventolin Inhaler 12mg 2 puffs every 4-6 hours as needed   Furosemide '20mg'$  1 a B Trulicity '3mg'$  - inject '3mg'$  into the skin once weekly   Diclofenac Sodium Gel 1%   Patient declined (meds) last month Gabapentin '100mg'$ - Picked up at [facility] on 01/31/22 90ds Valtrex '1000mg'$ - Pt does not need at this time Breo Ellipta Inhaler 100-25- Pt has enough on hand    Xanax 0.5 mg 3 times daily - [facility]. [facility] gets at [facility] Linaclotide 1421m- Received samples from Office  Tizanidine 0.5 mg 2 times daily- Does not need gets at [facility]  Flonase Nasal Spray- Plenty of supply only uses as needed  Nitroglycerin 0.4- Has enough. Only uses prn  Mupirocin Ointment 2% - Only uses prn. Last fill 10/19/21 30ds. Only uses prn  Test Strips/Lancets- Gets from [facility]. Cannot be billed through her insurance   Patient is due for next adherence delivery on: 03/23/22. Called patient and reviewed medications and coordinated delivery.  This delivery to include: Montelukast '10mg'$  1 B Lisinopril-HCTZ 20-'25mg'$  1 B Vascepa 1gm 2 B 2 EM Ventolin Inhaler -2 puffs every 4-6 hours as needed  Fetzima ER '40mg'$  1 B Lansoprazole '30mg'$  1 BB Fenofibrate '160mg'$  1 B Rosuvastatin '20mg'$  1 BT Clopidogrel '75mg'$  1 B Lamotrigine '25mg'$  2 BT Diclofenac Sodium Gel 1%-use as needed as directed  Furosemide '20mg'$  1 B  Patient declined  the following medications  Trulicity '3mg'$ -Pt still has 3 boxes left and picked up another supply at [facility] at 01/10/22 90ds so she had an oversupply. She can wait until next delivery. Gabapentin '100mg'$ - Picked up at [facility] on 01/31/22 90ds Valtrex '1000mg'$ - Pt does not need at this time Breo Ellipta Inhaler 100-25- Pt has enough on hand    Xanax 0.5 mg 3 times daily - [facility]. [facility] Linaclotide 14547m Received samples from Office  Tizanidine 0.5 mg 2 times daily- Does not need gets at [facility]  Flonase Nasal Spray- Plenty of supply only uses as needed  Nitroglycerin 0.4- Has enough. Only uses prn  Mupirocin Ointment 2% - Only uses prn. Last fill 10/19/21 30ds. Only uses prn  Test Strips/Lancets- Gets from [facility]. Cannot be billed through her insurance   Patient needs  refills -Request sent  Lisinopril-HCTZ 20-'25mg'$  Fenofibrate '160mg'$  Rosuvastatin '20mg'$   Clopidogrel '75mg'$    Confirmed delivery date of 03/23/22, advised patient that pharmacy will contact them the morning of delivery.   [name], [facility] Pharmacist Assistant  [phone]

## 2022-03-14 ENCOUNTER — Ambulatory Visit (HOSPITAL_COMMUNITY)
Admission: RE | Admit: 2022-03-14 | Discharge: 2022-03-14 | Disposition: A | Discharge status: Home / Self Care | Payer: Medicare Other | Source: Ambulatory Visit | Attending: Cardiovascular Disease | Admitting: Cardiovascular Disease

## 2022-03-14 DIAGNOSIS — I739 Peripheral vascular disease, unspecified: Secondary | ICD-10-CM | POA: Diagnosis present

## 2022-03-16 ENCOUNTER — Ambulatory Visit (INDEPENDENT_AMBULATORY_CARE_PROVIDER_SITE_OTHER): Payer: Medicare Other | Admitting: Family Medicine

## 2022-03-16 ENCOUNTER — Encounter: Payer: Self-pay | Admitting: Family Medicine

## 2022-03-16 VITALS — BP 140/72 | HR 93 | Temp 98.1°F | Resp 15 | Ht 65.0 in | Wt 163.0 lb

## 2022-03-16 DIAGNOSIS — E1140 Type 2 diabetes mellitus with diabetic neuropathy, unspecified: Secondary | ICD-10-CM | POA: Diagnosis not present

## 2022-03-16 DIAGNOSIS — E782 Mixed hyperlipidemia: Secondary | ICD-10-CM | POA: Diagnosis not present

## 2022-03-16 DIAGNOSIS — I110 Hypertensive heart disease with heart failure: Secondary | ICD-10-CM

## 2022-03-16 DIAGNOSIS — M791 Myalgia, unspecified site: Secondary | ICD-10-CM

## 2022-03-16 DIAGNOSIS — I5042 Chronic combined systolic (congestive) and diastolic (congestive) heart failure: Secondary | ICD-10-CM

## 2022-03-16 MED ORDER — GABAPENTIN 300 MG PO CAPS: 300.0000 mg | ORAL_CAPSULE | Freq: Every day | ORAL | 2 refills | Status: DC

## 2022-03-16 MED ORDER — BLOOD GLUCOSE MONITOR KIT: PACK | 0 refills | Status: DC

## 2022-03-16 NOTE — Assessment & Plan Note (Addendum)
Well controlled.  No changes to medicines. Continue fenofibrate 160 mg daily, rosuvastatin 20 mg daily, and Vascepa 1 gm 2 capsules twice daily. Continue to work on

## 2022-03-17 LAB — COMPREHENSIVE METABOLIC PANEL
ALT: 21 IU/L (ref 0–32)
AST: 20 IU/L (ref 0–40)
Albumin/Globulin Ratio: 1.7 (ref 1.2–2.2)
Albumin: 3.9 g/dL (ref 3.7–4.7)
Alkaline Phosphatase: 89 IU/L (ref 44–121)
BUN/Creatinine Ratio: 26 (ref 12–28)
BUN: 21 mg/dL (ref 8–27)
Bilirubin Total: <0.2 mg/dL (ref 0.0–1.2)
CO2: 23 mmol/L (ref 20–29)
Calcium: 9 mg/dL (ref 8.7–10.3)
Chloride: 100 mmol/L (ref 96–106)
Creatinine, Ser: 0.8 mg/dL (ref 0.57–1.00)
Globulin, Total: 2.3 g/dL (ref 1.5–4.5)
Glucose: 320 mg/dL — ABNORMAL HIGH (ref 70–99)
Potassium: 3.7 mmol/L (ref 3.5–5.2)
Sodium: 141 mmol/L (ref 134–144)
Total Protein: 6.2 g/dL (ref 6.0–8.5)
eGFR: 78 mL/min/{1.73_m2} (ref >59)

## 2022-03-17 LAB — MAGNESIUM: Magnesium: 2.3 mg/dL (ref 1.6–2.3)

## 2022-03-17 LAB — CK: Total CK: 66 U/L (ref 32–182)

## 2022-03-17 LAB — PHOSPHORUS: Phosphorus: 3.5 mg/dL (ref 3.0–4.3)

## 2022-03-19 ENCOUNTER — Other Ambulatory Visit: Payer: Self-pay

## 2022-03-19 NOTE — Telephone Encounter (Signed)
Coordinated with team for Trulicity PAP

## 2022-03-20 ENCOUNTER — Telehealth: Payer: Self-pay

## 2022-03-20 ENCOUNTER — Other Ambulatory Visit: Payer: Self-pay

## 2022-03-20 MED ORDER — DULAGLUTIDE 4.5 MG/0.5ML ~~LOC~~ SOAJ
4.5000 mg | SUBCUTANEOUS | 2 refills | Status: DC
Start: 2022-03-20 — End: 2022-08-24

## 2022-03-20 NOTE — Progress Notes (Addendum)
I received a message from my team to see if pt is on PAP for Trulicity which [employer] is no longer accepting applications for this medication. Pt has been getting this filled through [facility] pharmacy. I sent a message to the clinical pool to send in the increased of 4.5mg  to [place]. I called pt and left a detailed message informing her of this. I told her to call me back if she had additional questions. Please advise if this medication needs to change to fit pt financial needs.    [name], CMA Clinical Pharmacist Assistant  [phone]

## 2022-03-21 ENCOUNTER — Other Ambulatory Visit: Payer: Self-pay | Admitting: Family Medicine

## 2022-03-21 DIAGNOSIS — M5432 Sciatica, left side: Secondary | ICD-10-CM

## 2022-03-29 ENCOUNTER — Other Ambulatory Visit: Payer: Self-pay

## 2022-03-29 DIAGNOSIS — R5383 Other fatigue: Secondary | ICD-10-CM

## 2022-03-29 MED ORDER — ALPRAZOLAM 0.5 MG PO TABS: 0.5000 mg | ORAL_TABLET | Freq: Three times a day (TID) | ORAL | 2 refills | Status: DC

## 2022-04-08 NOTE — Progress Notes (Signed)
Cardiology Office Note    Date:  04/13/2022   ID:  Sonya, Small [birth year], MRN [id number]  PCP:  [name], MD  Cardiologist:  Dr. [name]  Chief Complaint  Patient presents with   Coronary Artery Disease    History of Present Illness:  Sonya Small is a 71 y.o. female with PMH of HTN, HLD, OSA, PAD, CVA, chronic diastolic heart failure and CAD.  Patient had a NSTEMI in March 2010 and underwent DES to left circumflex.  She had another NSTEMI in May 2011 secondary to thrombotic RCA lesion, cardiac catheterization showed nonobstructive disease, medical therapy was recommended.  She was admitted for recurrent chest pain in January 2014, Myoview was negative.  She was admitted for acute CVA in October 2015 after presenting with slurred speech and facial drooping.  MRI showed acute right MCA infarct involving basal ganglia and periventricular white matter.  She was started on aspirin along with Plavix.  Myoview obtained in August 2017 showed no ischemia, normal EF.   ABI obtained in July 2018 was normal.    She was seen by Sonya Deforest PA-C on 03/13/2018  with chest pain.  A Myoview study on 03/25/2018 which showed EF 52%, small sized moderate intensity fixed apical perfusion defect likely attenuation artifact was seen, no reversible ischemia otherwise.  Overall this is a low risk stress test.  Seen again on 05/22/2018, he was having a lot of dizziness. She was noted to be taking different doses of medication than what was prescribed. BP was stable. Polypharmacy with use of Xanax, oxycodone, Tizanidiine, Zoloft. Dizziness felt to be more related to medication than to a vascular issue. Carotid dopplers were checked and were OK.  She was admitted in May 2020 with PNA and respiratory failure requiring ventilator support. Treated with antibiotics and breathing treatments with improvement. Still smoking.  She was seen in the ED on 10/19/20 with acute mid thoracic pain. Started left scapula and moved to  the right. She states Ntg did help. Troponin was normal x 2. Ecg showed mild ST depression in leads V5-6. CXR normal. Other labs normal. Pain lasted about 30 minutes.  Since then her pain has resolved.    When seen last year she noted that she has had pain across her shoulder blades and upper back for several months. It is worse with activity. She does get relief with pain medication and Ntg.  We repeated a Myoview study and it was low risk. Unchanged from prior. Continued on medical therapy.  When seen in June she ws having worsening chest pain. She has been under a lot of stress. She was caring for her brother and he died this past month with lung CA. She has complained of chest pain since then. Worse when stressed. Some relief with sl Ntg but she doesn't like to take. She notes pain across her anterior chest and into her back. Doesn't feel well. Has trouble staying awake. Fingers are numb. Notes toes are blue.   She underwent cardiac cath which showed no significant obstructive CAD. She also had follow up LE arterial vascular dopplers which were stable with good ABIs.   On follow up today she is doing better. No problems with procedure. Planning to quit smoking    Past Medical History:  Diagnosis Date   Anxiety    CAD (coronary artery disease)    a. NSTEMI 11/2008 s/p DES to LCx (3.0x12 Xience); b. NSTEMI 01/2010 secondary to thrombotic RCA lesion (non-obstructive)-->med rx (  integrilin x 24 hrs + plavix); c. 09/2012 negative Myoview.   Chronic diastolic CHF (congestive heart failure) ([place])    a. 06/2014 Echo: EF 55-60%, no rwma, Gr1 DD, mild AI.   Depression    Dizziness    Drug induced constipation    Ganglion cyst of left foot 01/17/2021   Generalized hyperhidrosis    GERD (gastroesophageal reflux disease)    Headache    Hyperlipidemia    Hypertensive heart disease    Lumbar disc disease    Metabolic encephalopathy    Mixed hyperlipidemia    Myocardial infarction ([place])    Obstructive  sleep apnea    OP (osteoporosis)    Osteoarthritis    Osteoporosis    Other malaise    Overweight(278.02)    PAD (peripheral artery disease) ([place])    a. Emboli to R foot 2010 from partially occlusive lesion in R EIA, s/p stenting. - followed by Dr. Donnetta Hutching;  b. 10/2015 ABIs: R 1.03, L 0.97.   Restless leg    Sleep apnea    Stroke [facility])    TIA (transient ischemic attack)    Tobacco abuse    Urge incontinence     Past Surgical History:  Procedure Laterality Date   CYST EXCISION Left 01/2021   left foot   EYE SURGERY     at age 40   hysterectomy -age 54     ILIAC ARTERY STENT     RIGHT ILIAC STENT   KNEE ARTHROSCOPY     LEFT HEART CATH AND CORONARY ANGIOGRAPHY N/A 03/05/2022   Procedure: LEFT HEART CATH AND CORONARY ANGIOGRAPHY;  Surgeon: [name], MD;  Location: [place] [facility];  Service: Cardiovascular;  Laterality: N/A;   LITHOTRIPSY Left 12/2020   LUMBAR LAMINECTOMY     TUBAL LIGATION      Current Medications: Outpatient Medications Prior to Visit  Medication Sig Dispense Refill   albuterol (PROVENTIL) (2.5 MG/3ML) 0.083% nebulizer solution Take 2.5 mg by nebulization every 6 (six) hours as needed for wheezing or shortness of breath.     albuterol (VENTOLIN HFA) 108 (90 Base) MCG/ACT inhaler Inhale 2 puffs into the lungs every 4 (four) hours as needed for wheezing or shortness of breath.     ALPRAZolam (XANAX) 0.5 MG tablet Take 1 tablet (0.5 mg total) by mouth 3 (three) times daily. 90 tablet 2   Aspirin-Acetaminophen (GOODYS BODY PAIN PO) Take 1 packet by mouth daily.     blood glucose meter kit and supplies KIT Dispense based on patient and insurance preference. Use up to four times daily as directed. 1 each 0   BREO ELLIPTA 100-25 MCG/ACT AEPB Inhale 1 puff into the lungs daily.     clopidogrel (PLAVIX) 75 MG tablet Take 1 tablet (75 mg total) by mouth every morning. 90 tablet 2   diclofenac Sodium (VOLTAREN) 1 % GEL Apply 4 g topically daily as needed (Hip  and leg pain). 100 g 1   Dulaglutide 4.5 MG/0.5ML SOPN Inject 4.5 mg into the skin once a week. 2 mL 2   fenofibrate 160 MG tablet TAKE ONE CAPSULE BY MOUTH EVERY MORNING 90 tablet 2   fluticasone (FLONASE) 50 MCG/ACT nasal spray INSTILL 1 SPRAY IN EACH NOSTRIL ONCE DAILY (Patient taking differently: Place 1 spray into both nostrils daily as needed for rhinitis.) 16 g 1   furosemide (LASIX) 20 MG tablet Take 1 tablet (20 mg total) by mouth daily. 30 tablet 2   gabapentin (NEURONTIN) 300 MG  capsule Take 1 capsule (300 mg total) by mouth at bedtime. 30 capsule 2   glucose blood (ONETOUCH VERIO) test strip Use as instructed 100 strip 3   icosapent Ethyl (VASCEPA) 1 g capsule Take 2 capsules (2 g total) by mouth 2 (two) times daily. 360 capsule 1   lamoTRIgine (LAMICTAL) 25 MG tablet Take 2 tablets (50 mg total) by mouth at bedtime. 180 tablet 1   lansoprazole (PREVACID) 30 MG capsule TAKE ONE CAPSULE BY MOUTH BEFORE BREAKFAST 90 capsule 3   Levomilnacipran HCl ER 20 MG CP24 Take 20 mg by mouth 2 (two) times daily. 60 capsule 2   linaclotide (LINZESS) 145 MCG CAPS capsule Take 1 capsule (145 mcg total) by mouth daily before breakfast. 12 capsule 0   lisinopril-hydrochlorothiazide (ZESTORETIC) 20-25 MG tablet Take 1 tablet by mouth daily. 30 tablet 3   methocarbamol (ROBAXIN) 750 MG tablet Take 750 mg by mouth 3 (three) times daily.     montelukast (SINGULAIR) 10 MG tablet Take 10 mg by mouth daily.     mupirocin ointment (BACTROBAN) 2 % Apply 1 application topically 2 (two) times daily. (Patient taking differently: Apply 1 application  topically daily as needed (Rash).) 30 g 0   naloxone (NARCAN) nasal spray 4 mg/0.1 mL SMARTSIG:1 Both Nares Daily     nitroGLYCERIN (NITROSTAT) 0.4 MG SL tablet Place 1 tablet (0.4 mg total) under the tongue every 5 (five) minutes as needed. 25 tablet 1   oxyCODONE-acetaminophen (PERCOCET) 10-325 MG tablet Take 1 tablet by mouth every 6 (six) hours as needed for pain.      rosuvastatin (CRESTOR) 20 MG tablet TAKE ONE TABLET BY MOUTH EVERYDAY AT BEDTIME 90 tablet 2   tiZANidine (ZANAFLEX) 4 MG tablet Take 2 mg by mouth daily as needed for muscle spasms.     TRUEplus Lancets 30G MISC 1 each by Does not apply route 2 (two) times daily. E11.69 100 each 3   valACYclovir (VALTREX) 1000 MG tablet TAKE TWO TABLETS BY MOUTH twice A DAY FOR ONE DAY as needed (Patient taking differently: Take 500-1,000 mg by mouth daily as needed (Fever blister).) 10 tablet 2   Facility-Administered Medications Prior to Visit  Medication Dose Route Frequency Provider Last Rate Last Admin   sodium chloride flush (NS) 0.9 % injection 3 mL  3 mL Intravenous Q12H [name], Jazzy Parmer M, MD         Allergies:   Abilify [aripiprazole] and Latex   Social History   Socioeconomic History   Marital status: Divorced    Spouse name: Not on file   Number of children: 3   Years of education: 10 th   Highest education level: Not on file  Occupational History   Occupation: DISABLED    Employer: UNEMPLOYED  Tobacco Use   Smoking status: Every Day    Packs/day: 0.50    Years: 54.00    Total pack years: 27.00    Types: Cigarettes    Last attempt to quit: 11/20/2012    Years since quitting: 9.4   Smokeless tobacco: Never   Tobacco comments:    2 ppd for many years.     04/13/2022 Patient smokes 1/2 pack daily or more if she is nervous  Substance and Sexual Activity   Alcohol use: No    Alcohol/week: 0.0 standard drinks of alcohol   Drug use: No   Sexual activity: Never  Other Topics Concern   Not on file  Social History Narrative   Patient is single  with 3 children, 1 deceased.   Patient is right handed.   Patient has 10 th grade education.   Patient drinks 5 or more cups daily.   Social Determinants of Health   Financial Resource Strain: High Risk (07/03/2021)   Overall Financial Resource Strain (CARDIA)    Difficulty of Paying Living Expenses: Hard  Food Insecurity: No Food  Insecurity (08/10/2020)   Hunger Vital Sign    Worried About Running Out of Food in the Last Year: Never true    Ran Out of Food in the Last Year: Never true  Transportation Needs: No Transportation Needs (01/19/2020)   PRAPARE - Hydrologist (Medical): No    Lack of Transportation (Non-Medical): No  Physical Activity: Not on file  Stress: Stress Concern Present (01/19/2020)   [place]    Feeling of Stress : Rather much  Social Connections: Not on file     Family History:  The patient's family history includes Alzheimer's disease in her mother; Depression in her brother; Heart disease in an other family member; Hepatitis C in her brother; Hodgkin's lymphoma in her brother; Hypertension in her brother; Hypothyroidism in her brother; Lung cancer in her brother.   ROS:   Please see the history of present illness.    ROS All other systems reviewed and are negative.   PHYSICAL EXAM:   VS:  BP (!) 142/76 (BP Location: Left Arm, Patient Position: Sitting, Cuff Size: Normal)   Pulse 85   Ht 5' 5"  (1.651 m)   Wt 159 lb 6.4 oz (72.3 kg)   SpO2 96%   BMI 26.53 kg/m    GEN: Well nourished, well developed, in no acute distress  HEENT: normal  Neck: no JVD, carotid bruits, or masses Cardiac: RRR; no murmurs, rubs, or gallops,no edema. Pedal pulses are palpable but toes are somewhat dusky on plantar side.  Respiratory:  clear to auscultation bilaterally, normal work of breathing GI: soft, nontender, nondistended, + BS MS: no deformity or atrophy  Skin: warm and dry, no rash Neuro:  Alert and Oriented x 3, Strength and sensation are intact Psych: euthymic mood, full affect  Wt Readings from Last 3 Encounters:  04/13/22 159 lb 6.4 oz (72.3 kg)  03/16/22 163 lb (73.9 kg)  03/05/22 161 lb (73 kg)      Studies/Labs Reviewed:   EKG:  EKG is not ordered today    Recent Labs: 03/01/2022:  Hemoglobin 14.4; Platelets 272 03/16/2022: ALT 21; BUN 21; Creatinine, Ser 0.80; Magnesium 2.3; Potassium 3.7; Sodium 141   Lipid Panel    Component Value Date/Time   CHOL 147 03/01/2022 1047   TRIG 134 03/01/2022 1047   HDL 41 03/01/2022 1047   CHOLHDL 3.6 03/01/2022 1047   CHOLHDL 5.0 07/13/2014 0108   VLDL 42 (H) 07/13/2014 0108   LDLCALC 82 03/01/2022 1047   Labs dated 06/17/18: CBC normal except for elevated WBC 13.3.  Creatinine 1.14. Potassium 5.5.  Dated 05/23/18: cholesterol 142, triglycerides 212, HDL 35, LDL 65.  Dated 07/29/18: Normal chemistries and CBC.  Dated 09/02/19: cholesterol 165, triglycerides 146, HDL 49, LDL 62. A1c 7%. Creatinine 0.56. potassium 3.3. CBC and TSH normal.  Additional studies/ records that were reviewed today include:   Myoview 03/25/2018 The left ventricular ejection fraction is mildly decreased (45-54%). Nuclear stress EF: 52%. No T wave inversion was noted during stress. There was no ST segment deviation noted during stress. Defect 1:  There is a small defect of moderate severity. This is a low risk study.   Small size, moderate intensity fixed apical perfusion defect, likely attenuation artifact. No reversible ischemia. LVEF 52% with normal wall motion. This is a low risk study.  Myoview 08/22/21: Study Highlights      Findings are consistent with no prior ischemia. The study is low risk.   No ST deviation was noted.   LV perfusion is abnormal. Defect 1: There is a small defect with mild reduction in uptake present in the apical apex location(s) that is fixed. There is normal wall motion in the defect area. Consistent with artifact caused by breast attenuation.   Left ventricular function is normal. End diastolic cavity size is normal. End systolic cavity size is normal.   Prior study available for comparison from 03/25/2018. No changes compared to prior study. LVEF 52%, fixed apical attenuation artifact   Small size, mild intensity fixed apical  perfusion defect, likely attenuation artifact. No reversible ischemia. LVEF 52% with normal wall motion. This is a low risk study. Compared to a prior study in 2019, there are no changes (the apical perfusion defect was previously reported) - LVEF is unchanged.   Cardiac cath 03/05/22:   LEFT HEART CATH AND CORONARY ANGIOGRAPHY   Conclusion      1st Mrg lesion is 25% stenosed.   Previously placed Mid Cx stent of unknown type is  widely patent.   The left ventricular systolic function is normal.   LV end diastolic pressure is mildly elevated.   The left ventricular ejection fraction is 45-50% by visual estimate.  Apical hypokinesis consistent with Takotsubo cardiomyopathy.   There is no aortic valve stenosis.   No significant coronary artery disease.  She does have apical hypokinesis which is consistent with a Takotsubo cardiomyopathy pattern.  She has been under a lot of stress of late.  The apical hypokinesis is minimal so I suspect her Takotsubo is in the process of resolving.  Continue aggressive blood pressure control.  Consider echocardiogram in 6 weeks.   LVEDP was mildly elevated.  Could consider short-term diuretic as well if she has significant shortness of breath.  Coronary Diagrams  Diagnostic Dominance: Right  Intervention   ASSESSMENT:    1. Coronary artery disease involving native coronary artery of native heart with angina pectoris ([place])   2. PAD (peripheral artery disease) ([place])   3. Tobacco abuse   4. Hyperlipidemia, unspecified hyperlipidemia type   5. Essential hypertension      PLAN:  In order of problems listed above:  CAD: remote stent of mid LCx in 2010 with 3.0 x 12 DES. In 2011 had NSTEMI related to thrombotic event in distal RCA- nonobstructive. Myoview in November 2022  was normal.  Now with worsening chest pain but cardiac cath was unremarkable. Continue aspirin and Plavix.   Hypertension: Blood pressure is well controlled.  Carotid artery  disease: She has a history of mild carotid artery disease.  Chronic diastolic heart failure: Appears to be euvolemic on physical exam. Continue lasix 20 mg daily  5.   Dyslipidemia. On Crestor and Vascepa. LDL 82.   6.   Tobacco abuse. Recommend smoking cessation.   7.  PAD. Stable LE vascular studies.   Will follow up in one year    Medication Adjustments/Labs and Tests Ordered: Current medicines are reviewed at length with the patient today.  Concerns regarding medicines are outlined above.  Medication changes, Labs and Tests ordered today are listed  in the Patient Instructions below. There are no Patient Instructions on file for this visit.    Signed, Savio Albrecht [name], MD  04/13/2022 4:13 PM    [facility] [address], [place], [place]  [address] Phone: [phone]; Fax: [phone]

## 2022-04-11 ENCOUNTER — Other Ambulatory Visit: Payer: Self-pay

## 2022-04-11 ENCOUNTER — Telehealth: Payer: Self-pay

## 2022-04-11 DIAGNOSIS — I119 Hypertensive heart disease without heart failure: Secondary | ICD-10-CM

## 2022-04-11 MED ORDER — FUROSEMIDE 20 MG PO TABS: 20.0000 mg | ORAL_TABLET | Freq: Every day | ORAL | 2 refills | Status: DC

## 2022-04-11 MED ORDER — DICLOFENAC SODIUM 1 % EX GEL: 4.0000 g | Freq: Every day | CUTANEOUS | 1 refills | Status: DC | PRN

## 2022-04-11 MED ORDER — ICOSAPENT ETHYL 1 G PO CAPS: 2.0000 g | ORAL_CAPSULE | Freq: Two times a day (BID) | ORAL | 1 refills | Status: DC

## 2022-04-11 NOTE — Chronic Care Management (AMB) (Signed)
Chronic Care Management Pharmacy Assistant   Name: Sonya Small  MRN: [id number] DOB: September 29, 1950   Reason for Encounter: Medication Review/ Medication coordination  Recent office visits:  03-16-2022 Sonya Brome, MD. Glucose= 320. Referral placed to neurology. INCREASE gabapentin 100 mg nightly TO 300 mg nightly.  Recent consult visits:  03-14-2022 Sonya Sine, MD (Cardiology).  VAS [place] LOWER EXTREMITY ARTERIAL DUPLEX completed.  Hospital visits:  None in previous 6 months  Medications: Outpatient Encounter Medications as of 04/11/2022  Medication Sig   albuterol (PROVENTIL) (2.5 MG/3ML) 0.083% nebulizer solution Take 2.5 mg by nebulization every 6 (six) hours as needed for wheezing or shortness of breath.   albuterol (VENTOLIN HFA) 108 (90 Base) MCG/ACT inhaler Inhale 2 puffs into the lungs every 4 (four) hours as needed for wheezing or shortness of breath.   ALPRAZolam (XANAX) 0.5 MG tablet Take 1 tablet (0.5 mg total) by mouth 3 (three) times daily.   Aspirin-Acetaminophen (GOODYS BODY PAIN PO) Take 1 packet by mouth daily.   blood glucose meter kit and supplies KIT Dispense based on patient and insurance preference. Use up to four times daily as directed.   BREO ELLIPTA 100-25 MCG/ACT AEPB Inhale 1 puff into the lungs daily.   clopidogrel (PLAVIX) 75 MG tablet Take 1 tablet (75 mg total) by mouth every morning.   diclofenac Sodium (VOLTAREN) 1 % GEL Apply 4 g topically 4 (four) times daily. (Patient taking differently: Apply 4 g topically daily as needed (Hip and leg pain).)   Dulaglutide 4.5 MG/0.5ML SOPN Inject 4.5 mg into the skin once a week.   fenofibrate 160 MG tablet TAKE ONE CAPSULE BY MOUTH EVERY MORNING   fluticasone (FLONASE) 50 MCG/ACT nasal spray INSTILL 1 SPRAY IN EACH NOSTRIL ONCE DAILY (Patient taking differently: Place 1 spray into both nostrils daily as needed for rhinitis.)   furosemide (LASIX) 20 MG tablet Take 1 tablet (20 mg total) by mouth daily.    gabapentin (NEURONTIN) 300 MG capsule Take 1 capsule (300 mg total) by mouth at bedtime.   glucose blood (ONETOUCH VERIO) test strip Use as instructed   lamoTRIgine (LAMICTAL) 25 MG tablet Take 2 tablets (50 mg total) by mouth at bedtime.   lansoprazole (PREVACID) 30 MG capsule TAKE ONE CAPSULE BY MOUTH BEFORE BREAKFAST   Levomilnacipran HCl ER 20 MG CP24 Take 20 mg by mouth 2 (two) times daily.   linaclotide (LINZESS) 145 MCG CAPS capsule Take 1 capsule (145 mcg total) by mouth daily before breakfast.   lisinopril-hydrochlorothiazide (ZESTORETIC) 20-25 MG tablet Take 1 tablet by mouth daily.   montelukast (SINGULAIR) 10 MG tablet Take 10 mg by mouth daily.   mupirocin ointment (BACTROBAN) 2 % Apply 1 application topically 2 (two) times daily. (Patient taking differently: Apply 1 application  topically daily as needed (Rash).)   naloxone (NARCAN) nasal spray 4 mg/0.1 mL SMARTSIG:1 Both Nares Daily   nitroGLYCERIN (NITROSTAT) 0.4 MG SL tablet Place 1 tablet (0.4 mg total) under the tongue every 5 (five) minutes as needed.   oxyCODONE-acetaminophen (PERCOCET) 10-325 MG tablet Take 1 tablet by mouth every 6 (six) hours as needed for pain.   rosuvastatin (CRESTOR) 20 MG tablet TAKE ONE TABLET BY MOUTH EVERYDAY AT BEDTIME   tiZANidine (ZANAFLEX) 4 MG tablet Take 2 mg by mouth daily as needed for muscle spasms.   TRUEplus Lancets 30G MISC 1 each by Does not apply route 2 (two) times daily. E11.69   valACYclovir (VALTREX) 1000 MG tablet TAKE TWO  TABLETS BY MOUTH twice A DAY FOR ONE DAY as needed (Patient taking differently: Take 500-1,000 mg by mouth daily as needed (Fever blister).)   VASCEPA 1 g capsule TAKE TWO CAPSULES BY MOUTH TWICE DAILY   Facility-Administered Encounter Medications as of 04/11/2022  Medication   sodium chloride flush (NS) 0.9 % injection 3 mL  Reviewed chart for medication changes ahead of medication coordination call.  BP Readings from Last 3 Encounters:  03/16/22 140/72   03/05/22 (!) 153/76  03/01/22 120/80    Lab Results  Component Value Date   HGBA1C 7.4 (H) 01/24/2022     Patient obtains medications through Adherence Packaging  30 Days   Last adherence delivery included:  Montelukast 20m 1 B Lisinopril-HCTZ 20-219m1 B Vascepa 1gm 2 B 2 EM Ventolin Inhaler -2 puffs every 4-6 hours as needed  Fetzima ER 4065m B Lansoprazole 74m44mBB Fenofibrate 160mg26m Rosuvastatin 20mg 74m Clopidogrel 75mg 134mamotrigine 25mg 2 57miclofenac Sodium Gel 1%-use as needed as directed          Furosemide 20mg 1 B32mtient declined (meds) last month: Trulicity 3mg-Pt st81m has 3 boxes left and picked up another supply at [facility] at 01/10/22 90ds so she had an oversupply. She can wait until next delivery. Gabapentin 100mg- Pick49mp at [facility] on 01/31/22 90ds Valtrex 1000mg- Pt do80mot need at this time Breo Ellipta Inhaler 100-25- Pt has enough on hand    Xanax 0.5 mg 3 times daily - [facility] Linaclotide 145mcg- Recei20msamples from Office  Tizanidine 0.5 mg 2 times daily- Does not need [facility]  Flonase Nasal Spray- Plenty of supply only uses as needed  Nitroglycerin 0.4- Has enough. Only uses prn  Mupirocin Ointment 2% - Only uses prn. Last fill 10/19/21 30ds. Only uses prn  Test Strips/Lancets- Gets [facility]. Cannot be billed through her insurance   Patient is due for next adherence delivery on: 04-23-2022  Called patient and reviewed medications and coordinated delivery.  This delivery to include: Montelukast 10mg 1 B-  Li71mpril-HCTZ 20-25mg 1 B Vasce51mgm 2 B 2 EM Ventolin Inhaler -2 puffs every 4-6 hours as needed  Fetzima ER 40mg 1 B Lansop12mle 74mg 1 BB Fenofi22me 160mg 1 B Rosuvast29m 20mg 1 BT Clopidog22m75mg 1 B Lamotrigin82mmg 2 BT Gabapentin4mmg 1 BT       Furos58me 20mg 1 B  No acute/ sh14mfill needed  Patient declined the following medications: Diclofenac  gel- over supply Breo- over supply Trulicity- 9 weeks supply left  Patient needs refills for:  Furosemide- sent to PCP Diclofenac sodium gel- Sent PCP Vascepa- sent to PCP Ventolin inhaler- Chasity sent to specialist  Confirmed delivery date of 04-23-2022 advised patient that pharmacy will contact them the morning of delivery.  Care Gaps: Hep C screening overdue Tdap overdue Colonoscopy overdue Mammogram overdue Shingrix overdue PNA Vac overdue Yearly foot exam overdue  Star Rating Drugs: Rosuvastatin 20 mg- Last filled 03-19-2022 30 DS Lisinopril/HCTZ 20-25 mg- Last filled 03-19-2022 30 DS  Malecca Hicks CMA [facility] [phone]

## 2022-04-12 NOTE — Telephone Encounter (Signed)
Compliant on meds 

## 2022-04-13 ENCOUNTER — Encounter: Payer: Self-pay | Admitting: Cardiology

## 2022-04-13 ENCOUNTER — Ambulatory Visit (INDEPENDENT_AMBULATORY_CARE_PROVIDER_SITE_OTHER): Payer: Medicare Other | Admitting: Cardiology

## 2022-04-13 VITALS — BP 142/76 | HR 85 | Ht 65.0 in | Wt 159.4 lb

## 2022-04-13 DIAGNOSIS — I10 Essential (primary) hypertension: Secondary | ICD-10-CM

## 2022-04-13 DIAGNOSIS — I200 Unstable angina: Secondary | ICD-10-CM

## 2022-04-13 DIAGNOSIS — E785 Hyperlipidemia, unspecified: Secondary | ICD-10-CM

## 2022-04-13 DIAGNOSIS — I739 Peripheral vascular disease, unspecified: Secondary | ICD-10-CM

## 2022-04-13 DIAGNOSIS — I25119 Atherosclerotic heart disease of native coronary artery with unspecified angina pectoris: Secondary | ICD-10-CM

## 2022-04-13 DIAGNOSIS — Z720 Tobacco use: Secondary | ICD-10-CM | POA: Diagnosis not present

## 2022-04-13 MED ORDER — NITROGLYCERIN 0.4 MG SL SUBL
0.4000 mg | SUBLINGUAL_TABLET | SUBLINGUAL | 1 refills | Status: DC | PRN
Start: 2022-04-13 — End: 2023-02-19

## 2022-05-03 ENCOUNTER — Telehealth: Payer: Self-pay

## 2022-05-03 NOTE — Telephone Encounter (Signed)
I left message on voicemail to call us back to set up an appointment. 

## 2022-05-03 NOTE — Telephone Encounter (Signed)
Patient states she has been having BL lower leg pain that primarily happens at 4 a.m.[name] Rx pain medication, tylenol and ibuprofen have not helped, hot/cold compresses do not help. Typically getting up to walk around helps sometimes. Voltaren gel and peppermint oil do not help.

## 2022-05-03 NOTE — Telephone Encounter (Signed)
Patient needs appointment. Needs labs. Dr. [name]

## 2022-05-07 ENCOUNTER — Encounter: Payer: Medicare Other | Admitting: Family Medicine

## 2022-05-11 ENCOUNTER — Telehealth: Payer: Self-pay

## 2022-05-11 ENCOUNTER — Other Ambulatory Visit: Payer: Self-pay

## 2022-05-11 DIAGNOSIS — I119 Hypertensive heart disease without heart failure: Secondary | ICD-10-CM

## 2022-05-11 MED ORDER — FUROSEMIDE 20 MG PO TABS: 20.0000 mg | ORAL_TABLET | Freq: Every day | ORAL | 2 refills | Status: DC

## 2022-05-11 NOTE — Progress Notes (Signed)
Chronic Care Management Pharmacy Assistant   Name: Sonya Small  MRN: [id number] DOB: 04-01-1951   Reason for Encounter: Medication Coordination for [facility]    Recent office visits:  None  Recent consult visits:  04/13/22 (Cardiology) [name] [name] MD. Seen for CAD. No med changes.  Hospital visits:  None  Medications: Outpatient Encounter Medications as of 05/11/2022  Medication Sig Note   albuterol (PROVENTIL) (2.5 MG/3ML) 0.083% nebulizer solution Take 2.5 mg by nebulization every 6 (six) hours as needed for wheezing or shortness of breath.    albuterol (VENTOLIN HFA) 108 (90 Base) MCG/ACT inhaler Inhale 2 puffs into the lungs every 4 (four) hours as needed for wheezing or shortness of breath.    ALPRAZolam (XANAX) 0.5 MG tablet Take 1 tablet (0.5 mg total) by mouth 3 (three) times daily.    Aspirin-Acetaminophen (GOODYS BODY PAIN PO) Take 1 packet by mouth daily.    blood glucose meter kit and supplies KIT Dispense based on patient and insurance preference. Use up to four times daily as directed.    BREO ELLIPTA 100-25 MCG/ACT AEPB Inhale 1 puff into the lungs daily.    clopidogrel (PLAVIX) 75 MG tablet Take 1 tablet (75 mg total) by mouth every morning.    diclofenac Sodium (VOLTAREN) 1 % GEL Apply 4 g topically daily as needed (Hip and leg pain).    Dulaglutide 4.5 MG/0.5ML SOPN Inject 4.5 mg into the skin once a week.    fenofibrate 160 MG tablet TAKE ONE CAPSULE BY MOUTH EVERY MORNING    fluticasone (FLONASE) 50 MCG/ACT nasal spray INSTILL 1 SPRAY IN EACH NOSTRIL ONCE DAILY (Patient taking differently: Place 1 spray into both nostrils daily as needed for rhinitis.)    furosemide (LASIX) 20 MG tablet Take 1 tablet (20 mg total) by mouth daily.    gabapentin (NEURONTIN) 300 MG capsule Take 1 capsule (300 mg total) by mouth at bedtime.    glucose blood (ONETOUCH VERIO) test strip Use as instructed    icosapent Ethyl (VASCEPA) 1 g capsule Take 2 capsules (2 g total) by mouth 2  (two) times daily.    lamoTRIgine (LAMICTAL) 25 MG tablet Take 2 tablets (50 mg total) by mouth at bedtime.    lansoprazole (PREVACID) 30 MG capsule TAKE ONE CAPSULE BY MOUTH BEFORE BREAKFAST    Levomilnacipran HCl ER 20 MG CP24 Take 20 mg by mouth 2 (two) times daily.    linaclotide (LINZESS) 145 MCG CAPS capsule Take 1 capsule (145 mcg total) by mouth daily before breakfast.    lisinopril-hydrochlorothiazide (ZESTORETIC) 20-25 MG tablet Take 1 tablet by mouth daily.    methocarbamol (ROBAXIN) 750 MG tablet Take 750 mg by mouth 3 (three) times daily. 04/13/2022: Patient takes 1 tablet at night   montelukast (SINGULAIR) 10 MG tablet Take 10 mg by mouth daily.    mupirocin ointment (BACTROBAN) 2 % Apply 1 application topically 2 (two) times daily. (Patient taking differently: Apply 1 application  topically daily as needed (Rash).)    naloxone (NARCAN) nasal spray 4 mg/0.1 mL SMARTSIG:1 Both Nares Daily    nitroGLYCERIN (NITROSTAT) 0.4 MG SL tablet Place 1 tablet (0.4 mg total) under the tongue every 5 (five) minutes as needed.    oxyCODONE-acetaminophen (PERCOCET) 10-325 MG tablet Take 1 tablet by mouth every 6 (six) hours as needed for pain.    rosuvastatin (CRESTOR) 20 MG tablet TAKE ONE TABLET BY MOUTH EVERYDAY AT BEDTIME    tiZANidine (ZANAFLEX) 4 MG tablet Take 2  mg by mouth daily as needed for muscle spasms.    TRUEplus Lancets 30G MISC 1 each by Does not apply route 2 (two) times daily. E11.69    valACYclovir (VALTREX) 1000 MG tablet TAKE TWO TABLETS BY MOUTH twice A DAY FOR ONE DAY as needed (Patient taking differently: Take 500-1,000 mg by mouth daily as needed (Fever blister).)    Facility-Administered Encounter Medications as of 05/11/2022  Medication   sodium chloride flush (NS) 0.9 % injection 3 mL    Reviewed chart for medication changes ahead of medication coordination call.  No OVs, or hospital visits since last care coordination call/Pharmacist visit.  No medication changes  indicated OR if recent visit, treatment plan here.  BP Readings from Last 3 Encounters:  04/13/22 (!) 142/76  03/16/22 140/72  03/05/22 (!) 153/76    Lab Results  Component Value Date   HGBA1C 7.4 (H) 01/24/2022     Patient obtains medications through Adherence Packaging  30 Days   Last adherence delivery included:  Montelukast 66m 1 B-  Lisinopril-HCTZ 20-251m1 B Vascepa 1gm 2 B 2 EM Ventolin Inhaler -2 puffs every 4-6 hours as needed  Fetzima ER 4023m B Lansoprazole 57m78mBB Fenofibrate 160mg81m Rosuvastatin 20mg 56m Clopidogrel 75mg 15mamotrigine 25mg 2 19mabapentin 300mg 1 B68m    Furosemide 20mg 1 B 89mient declined (meds) last month  Diclofenac gel- over supply Breo- over supply Trulicity- 9 weeks supply left  Patient is due for next adherence delivery on: 05/23/22. Called patient and reviewed medications and coordinated delivery.  This delivery to include: Montelukast 10mg 1 B L20mopril-HCTZ 20-25mg 1 B Va15ma 1gm 2 B 2 EM Ventolin Inhaler -2 puffs every 4-6 hours as needed  Fetzima ER 40mg 1 B Lan85mazole 57mg 1 BB Fen23mrate 160mg 1 B Rosuv59mtin 20mg 1 BT Clopi76mel 75mg 1 B Lamotri100m 25mg 2 BT Gabapen68m300mg 1 BT       Fu35mmide 20mg 1 B Valtrex 1057m-TAKE TWO TABLET51m MOUTH twice A DAY FOR ONE DAY as needed  Patient declined the following medications  Trulicity 3mg-Pt still has 2 bo71m left  Valtrex 1000mg- Pt does not need61mthis time Breo Ellipta Inhaler 100-25- Pt has enough on hand    Xanax 0.5 mg 3 times daily - Got a fill at [facility] on 05/01/22 for 30days.  Linaclotide 145mcg- Received samples53mm Office  Tizanidine 0.5 mg 2 times daily- Does not need gets at [facility]  Flonase Nasal Spray- Plenty of supply only uses as needed  Nitroglycerin 0.4- Has enough. Only uses prn  Mupirocin Ointment 2% - Only uses prn. Last fill 10/19/21 30ds. Only uses prn  Test Strips/Lancets- Gets from [facility]. Cannot be billed through her insurance   Diclofenac gel- over supply Methocarbamol 750mg-[facility]24/[facility]  Patient needs refills-Request Sent  Fetzima ER 40mg  Furosemide 20mg   C83mrmed delivery 50m of 05/23/22, advised patient that pharmacy will contact them the morning of delivery.   Danielle Gerringer, CMA [facility] [place]gregorssSauk Village[address] [phone]

## 2022-05-11 NOTE — Telephone Encounter (Signed)
Compliant on meds 

## 2022-05-15 ENCOUNTER — Telehealth: Payer: Medicare Other

## 2022-05-16 ENCOUNTER — Other Ambulatory Visit: Payer: Self-pay | Admitting: Family Medicine

## 2022-05-18 ENCOUNTER — Other Ambulatory Visit: Payer: Self-pay | Admitting: Family Medicine

## 2022-05-19 ENCOUNTER — Encounter: Payer: Self-pay | Admitting: Family Medicine

## 2022-05-19 NOTE — Progress Notes (Signed)
No show  This encounter was created in error - please disregard.

## 2022-05-30 ENCOUNTER — Other Ambulatory Visit: Payer: Self-pay

## 2022-05-30 DIAGNOSIS — E1142 Type 2 diabetes mellitus with diabetic polyneuropathy: Secondary | ICD-10-CM

## 2022-05-30 DIAGNOSIS — E1140 Type 2 diabetes mellitus with diabetic neuropathy, unspecified: Secondary | ICD-10-CM

## 2022-05-30 MED ORDER — TRUEPLUS LANCETS 30G MISC: 1.0000 | Freq: Two times a day (BID) | 3 refills | Status: DC

## 2022-05-30 MED ORDER — BLOOD GLUCOSE MONITOR KIT: PACK | 0 refills | Status: AC

## 2022-06-07 ENCOUNTER — Ambulatory Visit (INDEPENDENT_AMBULATORY_CARE_PROVIDER_SITE_OTHER): Payer: Medicare Other

## 2022-06-07 DIAGNOSIS — E782 Mixed hyperlipidemia: Secondary | ICD-10-CM

## 2022-06-07 DIAGNOSIS — E1140 Type 2 diabetes mellitus with diabetic neuropathy, unspecified: Secondary | ICD-10-CM

## 2022-06-07 DIAGNOSIS — E1169 Type 2 diabetes mellitus with other specified complication: Secondary | ICD-10-CM

## 2022-06-07 DIAGNOSIS — I119 Hypertensive heart disease without heart failure: Secondary | ICD-10-CM

## 2022-06-07 NOTE — Progress Notes (Signed)
Chronic Care Management Pharmacy Note  06/07/2022 Name:  NEAVEH BELANGER MRN:  [id number] DOB:  [birth year]   Summary:  Very very very pleasant 71 year old female presents for f/u CCM visit Brother passed May 2023 of lung cancer. She misses him greatly and it has inspired her to try to quit smoking Her grandson was visiting her from [place] in Sept 2023 for 2 weeks. She was making him some chicken. She was very excited to be able to spend time with him  Plan Recommendations:  Patient can't afford Methocarbamol. Will let Pain doctor know Sugars elevated still but hasn't started Trulicity 6.2IW. Will wait for her to start before we re-assess  Subjective: WILMARIE SPARLIN is an 71 y.o. year old female who is a primary patient of [name], [name], MD.  The CCM team was consulted for assistance with disease management and care coordination needs.    Engaged with patient by telephone for follow up visit in response to provider referral for pharmacy case management and/or care coordination services.   Consent to Services:  The patient was given information about Chronic Care Management services, agreed to services, and gave verbal consent prior to initiation of services.  Please see initial visit note for detailed documentation.   Patient Care Team: [name], MD as PCP - General (Family Medicine) [name], [name], MD as PCP - Cardiology (Cardiology) Lane Hacker, [facility] (Pharmacist)  Recent office visits:  None   Recent consult visits:  04/13/22 (Cardiology) [name] [name] MD. Seen for CAD. No med changes.   Hospital visits:  None    Objective:  Lab Results  Component Value Date   CREATININE 0.80 03/16/2022   BUN 21 03/16/2022   GFRNONAA >60 10/19/2020   GFRAA 97 10/03/2020   NA 141 03/16/2022   K 3.7 03/16/2022   CALCIUM 9.0 03/16/2022   CO2 23 03/16/2022    Lab Results  Component Value Date/Time   HGBA1C 7.4 (H) 01/24/2022 10:10 AM   HGBA1C 6.6 (H) 09/07/2021 01:22 PM    MICROALBUR neg 02/27/2021 10:10 AM   MICROALBUR 80 11/25/2020 08:27 AM    Last diabetic Eye exam:  Lab Results  Component Value Date/Time   HMDIABEYEEXA No Retinopathy 06/27/2021 12:00 AM    Last diabetic Foot exam: No results found for: "HMDIABFOOTEX"   Lab Results  Component Value Date   CHOL 147 03/01/2022   HDL 41 03/01/2022   LDLCALC 82 03/01/2022   TRIG 134 03/01/2022   CHOLHDL 3.6 03/01/2022       Latest Ref Rng & Units 03/16/2022   11:31 AM 03/01/2022   10:47 AM 01/24/2022   10:10 AM  Hepatic Function  Total Protein 6.0 - 8.5 g/dL 6.2  6.9  6.3   Albumin 3.7 - 4.7 g/dL 3.9  4.3  3.9   AST 0 - 40 IU/L 20  20  16    ALT 0 - 32 IU/L 21  19  17    Alk Phosphatase 44 - 121 IU/L 89  86  89   Total Bilirubin 0.0 - 1.2 mg/dL <0.2  0.2  <0.2     Lab Results  Component Value Date/Time   TSH 0.619 12/04/2019 10:20 AM   TSH 1.130 07/13/2014 01:08 AM       Latest Ref Rng & Units 03/01/2022   10:47 AM 01/24/2022   10:10 AM 09/07/2021    1:22 PM  CBC  WBC 3.4 - 10.8 x10E3/uL 10.3  9.2  8.6  Hemoglobin 11.1 - 15.9 g/dL 14.4  15.1  14.8   Hematocrit 34.0 - 46.6 % 43.5  46.5  44.5   Platelets 150 - 450 x10E3/uL 272  270  274     No results found for: "VD25OH"  Clinical ASCVD: Yes  The 10-year ASCVD risk score (Arnett DK, et al., 2019) is: 48.7%   Values used to calculate the score:     Age: 8 years     Sex: Female     Is Non-Hispanic African American: No     Diabetic: Yes     Tobacco smoker: Yes     Systolic Blood Pressure: 982 mmHg     Is BP treated: Yes     HDL Cholesterol: 41 mg/dL     Total Cholesterol: 147 mg/dL       01/24/2022    9:16 AM 10/18/2021   10:44 PM 09/07/2021   11:05 AM  Depression screen PHQ 2/9  Decreased Interest 2 3 3   Down, Depressed, Hopeless 3 2 2   PHQ - 2 Score 5 5 5   Altered sleeping 2 3 3   Tired, decreased energy 3 3 3   Change in appetite 3 0 0  Feeling bad or failure about yourself  3 2 2   Trouble concentrating 2 3 3   Moving  slowly or fidgety/restless 0 2 2  Suicidal thoughts 0 0 0  PHQ-9 Score 18 18 18   Difficult doing work/chores Somewhat difficult Somewhat difficult        01/24/2022    9:16 AM 10/18/2021   10:44 PM 09/07/2021   11:05 AM  PHQ9 SCORE ONLY  PHQ-9 Total Score 18 18 18       05/08/2021   11:04 AM 08/10/2020    1:33 PM  GAD 7 : Generalized Anxiety Score  Nervous, Anxious, on Edge 3 3  Control/stop worrying 3 3  Worry too much - different things 3 3  Trouble relaxing 3 3  Restless 1 3  Easily annoyed or irritable 3 3  Afraid - awful might happen 3 3  Total GAD 7 Score 19 21  Anxiety Difficulty  Very difficult       Social History   Tobacco Use  Smoking Status Every Day   Packs/day: 0.50   Years: 54.00   Total pack years: 27.00   Types: Cigarettes   Last attempt to quit: 11/20/2012   Years since quitting: 9.5  Smokeless Tobacco Never  Tobacco Comments   2 ppd for many years.    04/13/2022 Patient smokes 1/2 pack daily or more if she is nervous   BP Readings from Last 3 Encounters:  04/13/22 (!) 142/76  03/16/22 140/72  03/05/22 (!) 153/76   Pulse Readings from Last 3 Encounters:  04/13/22 85  03/16/22 93  03/05/22 76   Wt Readings from Last 3 Encounters:  04/13/22 159 lb 6.4 oz (72.3 kg)  03/16/22 163 lb (73.9 kg)  03/05/22 161 lb (73 kg)    Assessment/Interventions: Review of patient past medical history, allergies, medications, health status, including review of consultants reports, laboratory and other test data, was performed as part of comprehensive evaluation and provision of chronic care management services.   SDOH:  (Social Determinants of Health) assessments and interventions performed: Yes SDOH Interventions    Flowsheet Row Chronic Care Management from 06/07/2022 in [place] Office Visit from 01/24/2022 in [place] Visit from 10/18/2021 in [place] Office Visit from 07/25/2021 in [place]  Management from 07/03/2021 in [place] Visit from 06/02/2021 in [place] Interventions        Transportation Interventions Intervention Not Indicated -- -- -- -- --  Depression Interventions/Treatment  -- Currently on Treatment Currently on Treatment Medication -- Medication  Financial Strain Interventions Other (Comment) -- -- -- Other (Comment)  [PAP] --       [place]  Allergies  Allergen Reactions   Abilify [Aripiprazole]     confusion   Latex Rash    Medications Reviewed Today     Reviewed by Lane Hacker, [facility] (Pharmacist) on 06/07/22 at 1349  Med List Status: <None>   Medication Order Taking? Sig Documenting Provider Last Dose Status Informant  albuterol (PROVENTIL) (2.5 MG/3ML) 0.083% nebulizer solution [id number] No Take 2.5 mg by nebulization every 6 (six) hours as needed for wheezing or shortness of breath. [provider] Taking Active Self  albuterol (VENTOLIN HFA) 108 (90 Base) MCG/ACT inhaler [id number] No Inhale 2 puffs into the lungs every 4 (four) hours as needed for wheezing or shortness of breath. [provider] Taking Active Self  ALPRAZolam (XANAX) 0.5 MG tablet [id number] No Take 1 tablet (0.5 mg total) by mouth 3 (three) times daily. [name], MD Taking Active   Aspirin-Acetaminophen (GOODYS BODY PAIN PO) [id number] No Take 1 packet by mouth daily. [provider] Taking Active Self  blood glucose meter kit and supplies KIT [id number]  Dispense based on patient and insurance preference. Use up to four times daily as directed. [name], [name], MD  Active   BREO ELLIPTA 100-25 MCG/ACT AEPB [id number] No Inhale 1 puff into the lungs daily. [provider] Taking Active Self  clopidogrel (PLAVIX) 75 MG tablet [id number] No Take 1 tablet (75 mg total) by mouth every morning. [name], [name], MD Taking Active   diclofenac Sodium (VOLTAREN) 1 % GEL [id number] No Apply 4 g topically daily as needed (Hip  and leg pain). [name], MD Taking Active   Dulaglutide 4.5 MG/0.5ML SOPN [id number] No Inject 4.5 mg into the skin once a week. [name], MD Taking Active   fenofibrate 160 MG tablet [id number] No TAKE ONE CAPSULE BY MOUTH EVERY MORNING [name], [name], MD Taking Active   FETZIMA 40 MG CP24 [id number]  TAKE ONE CAPSULE BY MOUTH ONCE DAILY [name], [name], MD  Active   fluticasone (FLONASE) 50 MCG/ACT nasal spray [id number] No INSTILL 1 SPRAY IN EACH NOSTRIL ONCE DAILY  Patient taking differently: Place 1 spray into both nostrils daily as needed for rhinitis.   [name], [name], MD Taking Active Self  furosemide (LASIX) 20 MG tablet [id number] No Take 1 tablet (20 mg total) by mouth daily. [name], [name], MD Taking Active   gabapentin (NEURONTIN) 300 MG capsule [id number]  TAKE ONE CAPSULE BY MOUTH EVERYDAY AT BEDTIME [name], [name], MD  Active   glucose blood [facility] VERIO) test strip [id number] No Use as instructed [name], Elnita Maxwell, MD Taking Active Self  icosapent Ethyl (VASCEPA) 1 g capsule [id number] No Take 2 capsules (2 g total) by mouth 2 (two) times daily. [name], [name], MD Taking Active   lamoTRIgine (LAMICTAL) 25 MG tablet [id number] No Take 2 tablets (50 mg total) by mouth at bedtime. [name], MD Taking Active Self  lansoprazole (PREVACID) 30 MG capsule [id number] No TAKE ONE CAPSULE BY MOUTH BEFORE BREAKFAST Marge Duncans, PA-C Taking Active   Levomilnacipran HCl ER 20 MG CP24 [id number] No Take 20 mg by mouth 2 (two) times daily.  [name], [name], MD Taking Active Self  linaclotide Rolan Lipa) 145 MCG CAPS capsule [id number] No Take 1 capsule (145 mcg total) by mouth daily before breakfast. [name], [name], MD Taking Active Self  lisinopril-hydrochlorothiazide (ZESTORETIC) 20-25 MG tablet [id number] No Take 1 tablet by mouth daily. [name], [name], MD Taking Active   methocarbamol (ROBAXIN) 750 MG tablet [id number] No Take 750 mg by mouth 3 (three) times daily. [provider] Taking Active            Med  Note Netta Neat, Hermina Staggers   Fri Apr 13, 2022  4:03 PM) Patient takes 1 tablet at night  montelukast (SINGULAIR) 10 MG tablet [id number] No Take 10 mg by mouth daily. [provider] Taking Active Self  mupirocin ointment (BACTROBAN) 2 % [id number] No Apply 1 application topically 2 (two) times daily.  Patient taking differently: Apply 1 application  topically daily as needed (Rash).   [name], [name], MD Taking Active Self  naloxone [facility]) nasal spray 4 mg/0.1 mL [id number] No SMARTSIG:1 Both Nares Daily [provider] Taking Active   nitroGLYCERIN (NITROSTAT) 0.4 MG SL tablet [id number]  Place 1 tablet (0.4 mg total) under the tongue every 5 (five) minutes as needed. [name], [name], MD  Active   oxyCODONE-acetaminophen (PERCOCET) 10-325 MG tablet [id number] No Take 1 tablet by mouth every 6 (six) hours as needed for pain. [provider] Taking Active Self  rosuvastatin (CRESTOR) 20 MG tablet [id number] No TAKE ONE TABLET BY MOUTH EVERYDAY AT BEDTIME [name], [name], MD Taking Active   sodium chloride flush (NS) 0.9 % injection 3 mL [id number]   [name], [name], MD  Active   tiZANidine (ZANAFLEX) 4 MG tablet [id number] No Take 2 mg by mouth daily as needed for muscle spasms. [provider] Taking Active Self  TRUEplus Lancets 30G Miner [id number]  1 each by Does not apply route 2 (two) times daily. E11.69 [name], MD  Active   valACYclovir (VALTREX) 1000 MG tablet [id number]  TAKE TWO TABLETS BY MOUTH twice A DAY as needed [name], Elnita Maxwell, MD  Active             Patient Active Problem List   Diagnosis Date Noted   Hypomagnesemia 03/16/2022   Type 2 diabetes mellitus with diabetic neuropathy, without long-term current use of insulin ([place]) 03/16/2022   Myalgia 03/16/2022   Hypertensive heart disease with chronic combined systolic and diastolic congestive heart failure ([place]) 03/16/2022   Unstable angina (HCC)    Therapeutic opioid-induced constipation (OIC) 10/18/2021    Skin sore 10/18/2021   Recurrent major depression-severe ([place]) 09/24/2021   Other fatigue 07/27/2021   Left sided sciatica 07/27/2021   Need for immunization against influenza 06/04/2021   BMI 27.0-27.9,adult 06/04/2021   Mixed stress and urge urinary incontinence 12/25/2019   Dry mouth 12/18/2019   Diabetic polyneuropathy associated with type 2 diabetes mellitus ([place]) 12/16/2019   Depression, major, recurrent, moderate ([place]) 12/03/2019   Cigarette nicotine dependence with nicotine-induced disorder 12/03/2019   Pituitary adenoma ([place]) 02/22/2015   Cerebral infarction due to thrombosis of left middle cerebral artery ([place]) 08/23/2014   COPD mixed type ([place]) 07/13/2014   OSA (obstructive sleep apnea) 07/13/2014   Chronic diastolic heart failure ([place]) 07/13/2014   Coronary artery disease    PAD (peripheral artery disease) ([place])    Mixed hyperlipidemia    History of myocardial infarction 01/22/2010    Immunization History  Administered Date(s) Administered   Fluad Quad(high Dose 65+) 08/29/2020, 06/02/2021   Influenza,inj,Quad PF,6+  Mos 07/13/2014   Influenza-Unspecified 05/29/2019   Moderna Sars-Covid-2 Vaccination 07/11/2020, 08/01/2020   Pneumococcal Conjugate-13 01/20/2015   Pneumococcal Polysaccharide-23 06/26/2011    Conditions to be addressed/monitored:  Hypertension, Hyperlipidemia, Diabetes, Heart Failure, Coronary Artery Disease and COPD  Care Plan : ccm pharmacy care plan  Updates made by Lane Hacker, [place] since 06/07/2022 12:00 AM     Problem: htn,hld,dm   Priority: High  Onset Date: 11/15/2020     Long-Range Goal: disease state management   Start Date: 11/15/2020  Expected End Date: 11/15/2021  Recent Progress: On track  Priority: High  Note:   Current Barriers:  Unable to maintain control of anxiety/depression currently.   Needs testing supplies through part B benefit.   Pharmacist Clinical Goal(s):  Over the next 90 days, patient will maintain  control of blood sugar  as evidenced by a1c  through collaboration with PharmD and provider.   Interventions: 1:1 collaboration with [name], MD regarding development and update of comprehensive plan of care as evidenced by provider attestation and co-signature Inter-disciplinary care team collaboration (see longitudinal plan of care) Comprehensive medication review performed; medication list updated in electronic medical record  Hypertension (BP goal <130/80) -controlled -Current treatment: Lisinopril-hydrochlorothiazide 20/25 mg every morning Appropriate, Effective, Safe, Accessible -Medications previously tried: amlodipine  -Current home readings: not checking at this time.  -Current dietary habits: enjoying fruit. Cooks at home. Reports limited appetite since brother has been ill.  -Current exercise habits: minimal due to back pain and breathing  -Denies hypotensive/hypertensive symptoms -Educated on BP goals and benefits of medications for prevention of heart attack, stroke and kidney damage; Daily salt intake goal < 2300 mg; Importance of home blood pressure monitoring; -Counseled to monitor BP at home daily, document, and provide log at future appointments -Counseled on diet and exercise extensively Recommended to continue current medication  Hyperlipidemia/CAD: (LDL goal < 55) -Patient had a NSTEMI in March 2010 and underwent DES to left circumflex.  She had another NSTEMI in May 2011 secondary to thrombotic RCA lesion, cardiac catheterization showed nonobstructive disease, medical therapy was recommended.  She was admitted for recurrent chest pain in January 2014, Myoview was negative.  She was admitted for acute CVA in October 2015 after presenting with slurred speech and facial drooping.  MRI showed acute right MCA infarct involving basal ganglia and periventricular white matter.  She was started on aspirin along with Plavix.  Myoview obtained in August 2017 showed no  ischemia, normal EF.   ABI obtained in July 2018 was normal.    -She was seen by Almyra Deforest PA-C on 03/13/2018  with chest pain.  A Myoview study on 03/25/2018 which showed EF 52%.    -She was seen in the ED on 10/19/20 with acute mid thoracic pain. Started left scapula and moved to the right. She states Ntg did help. Troponin was normal x 2. Ecg showed mild ST depression in leads V5-6. CXR normal. Other labs normal. Pain lasted about 30 minutes  -When seen last year she noted that she has had pain across her shoulder blades and upper back for several months. It is worse with activity. She does get relief with pain medication and Ntg.   -When seen in June she ws having worsening chest pain. She has been under a lot of stress. She was caring for her brother and he died this past month with lung CA. She has complained of chest pain since then. Worse when stressed. Some relief with sl Ntg  but she doesn't like to take. She notes pain across her anterior chest and into her back. Doesn't feel well. Has trouble staying awake. Fingers are numb. Notes toes are blue. She underwent cardiac cath which showed no significant obstructive CAD. She also had follow up LE arterial vascular dopplers which were stable with good ABIs.  -uncontrolled -Current treatment: rosuvastatin 20 mg daily at bedtime Appropriate, Effective, Safe, Accessible Fenofibrate 160 mg daily am Appropriate, Effective, Safe, Accessible Plavix 75 mg daily Appropriate, Effective, Safe, Accessible Vascepa 1 gram 2 capsules bid Appropriate, Effective, Safe, Accessible -Medications previously tried: none reported -Current dietary patterns: cooks at home. Eats meatloaf, pinto beans, stewed potatoes, homemade biscuits -Current exercise habits: limited due to back pain and shortness of breath  -Educated on Cholesterol goals;  Benefits of statin for ASCVD risk reduction; Importance of limiting foods high in cholesterol; -Counseled on diet and exercise  extensively Recommended to continue current medication.   Diabetes (A1c goal <7%) -controlled -Current medications: Onetouch Verio Trulicity 4.5 mg weekly  -Medications previously tried: none reported today  -Current home glucose readings fasting glucose:  Sept 2023: 05/26/22: 183 05/27/22: 167 05/30/22: 149 05/31/22: 170 06/01/22: 170 06/02/22: 161 06/03/22: 145 06/04/22: 123 06/05/22: 146 06/06/22: 126 06/07/22: 150 -Denies hypoglycemic/hyperglycemic symptoms -Current meal patterns:  breakfast: fruit  Lunch and supper:  variety of meat and vegtables drinks: nature's twist, water, diet drinks -Current exercise: limited due to back pain and breathing -Educated onA1c and blood sugar goals; Complications of diabetes including kidney damage, retinal damage, and cardiovascular disease; Benefits of routine self-monitoring of blood sugar; -Counseled to check feet daily and get yearly eye exams -Counseled on diet and exercise extensively Sept 2023: PCP increased Trulicity but patient hasn't started yet. Will wait to see results of increased dose  Depression/Anxiety (Goal: manage symptoms) -Uncontrolled -Current treatment: Lamotrigine 100 mg daily  Alprazolam 0.5 mg tid prn  Venlafaxine XR 86m -Medications previously tried/failed: Abilify  -PHQ9:     01/24/2022    9:16 AM 10/18/2021   10:44 PM 09/07/2021   11:05 AM  Depression screen PHQ 2/9  Decreased Interest 2 3 3   Down, Depressed, Hopeless 3 2 2   PHQ - 2 Score 5 5 5   Altered sleeping 2 3 3   Tired, decreased energy 3 3 3   Change in appetite 3 0 0  Feeling bad or failure about yourself  3 2 2   Trouble concentrating 2 3 3   Moving slowly or fidgety/restless 0 2 2  Suicidal thoughts 0 0 0  PHQ-9 Score 18 18 18   Difficult doing work/chores Somewhat difficult Somewhat difficult   -GAD7:     05/08/2021   11:04 AM 08/10/2020    1:33 PM  GAD 7 : Generalized Anxiety Score  Nervous, Anxious, on Edge 3 3  Control/stop worrying 3 3   Worry too much - different things 3 3  Trouble relaxing 3 3  Restless 1 3  Easily annoyed or irritable 3 3  Afraid - awful might happen 3 3  Total GAD 7 Score 19 21  Anxiety Difficulty  Very difficult  -Recommended connecting with counselor or psychiatry for mental health support - patient declines and states she talks to her dog.  -Educated on Benefits of medication for symptom control October 2022: Patient stated her brother was given 5 months to live upon Cancer Dx and October is month 5. Spent the majority of the visit listening to her talk about her mental health Sept 2023: Patient doing better mentally. Her  grandson is visiting her in [place] and she was making him some chicken when I called   Osteoporosis / Osteopenia (Goal prevent fracture or broken bones) -controlled -Last DEXA Scan:  T-Score femoral neck: -2.3  T-Score lumbar spine: -2.1  10-year probability of major osteoporotic fracture: 12.4%  10-year probability of hip fracture: 2.4% -Patient is not a candidate for pharmacologic treatment -Current treatment  Diet and lifestyle -Medications previously tried: none reported  -Recommend [id number] units of vitamin D daily. Recommend 1200 mg of calcium daily from dietary and supplemental sources. Recommend weight-bearing and muscle strengthening exercises for building and maintaining bone density. -Counseled on diet and exercise extensively  Smoking Cessation (Goal: Quit) -Uncontrolled -Current treatment  None -Medications previously tried: None  Sept 2023: Patient is trying to cut back at this time. She can go an hour without smoking which is good for her. Her Brother passed May 4th 2023 of lung cancer and that inspired her to quit  Chronic Pain -Not ideally controlled -Current treatment: Diclofenac Gel 1% Appropriate, Effective, Safe, Accessible Gabapentin 337m Appropriate, Effective, Safe, Accessible Lamotrigine 536mHS Appropriate, Effective, Safe,  Accessible Methocarbamol Appropriate, Query effective, ,  Oxy/APAp Appropriate, Effective, Safe, Accessible -Medications previously tried: Tizanidine (Didn't help)  -Pain Scale There were no vitals filed for this visit.  Aggravating Factors: Movement  Pain Type: chronic  Sept 2023: Patient wouldn't give pain scale but stated she is afraid to take Methocarbamol because she's heard it has some side effects, also it's significantly more expensive than her other meds. Will call pain provider and let them know     Patient Goals/Self-Care Activities Over the next 90 days, patient will:  - take medications as prescribed check glucose daily, document, and provide at future appointments check blood pressure daily, document, and provide at future appointments  Follow Up Plan: Telephone follow up appointment with care management team member scheduled for: Jan 2024  NaAri[place] ConstablePharm.D. - [phone]        Medication Assistance: None required.  Patient affirms current coverage meets needs.  Patient's preferred pharmacy is:  [facility] [place] [address] [place]: [phone]: [phone] [address]: [phone]: [id number] [id number]: [phone]: [phone] pill box? Yes Pt endorses good compliance  We discussed: Benefits of medication synchronization, packaging and delivery as well as enhanced pharmacist oversight with [facility]. Patient decided to: Utilize [facility] for medication synchronization, packaging and delivery  Care Plan and Follow Up Patient Decision:  Patient agrees to Care Plan and Follow-up.  Plan: Telephone follow up appointment with care  management team member scheduled for:  Jan 2024  [place]stable[place] - 3- [phone]

## 2022-06-07 NOTE — Patient Instructions (Signed)
Visit Information   Goals Addressed   None    Patient Care Plan: ccm pharmacy care plan     Problem Identified: htn,hld,dm   Priority: High  Onset Date: 11/15/2020     Long-Range Goal: disease state management   Start Date: 11/15/2020  Expected End Date: 11/15/2021  Recent Progress: On track  Priority: High  Note:   Current Barriers:  Unable to maintain control of anxiety/depression currently.   Needs testing supplies through part B benefit.   Pharmacist Clinical Goal(s):  Over the next 90 days, patient will maintain control of blood sugar  as evidenced by a1c  through collaboration with PharmD and provider.   Interventions: 1:1 collaboration with [name], MD regarding development and update of comprehensive plan of care as evidenced by provider attestation and co-signature Inter-disciplinary care team collaboration (see longitudinal plan of care) Comprehensive medication review performed; medication list updated in electronic medical record  Hypertension (BP goal <130/80) -controlled -Current treatment: Lisinopril-hydrochlorothiazide 20/25 mg every morning Appropriate, Effective, Safe, Accessible -Medications previously tried: amlodipine  -Current home readings: not checking at this time.  -Current dietary habits: enjoying fruit. Cooks at home. Reports limited appetite since brother has been ill.  -Current exercise habits: minimal due to back pain and breathing  -Denies hypotensive/hypertensive symptoms -Educated on BP goals and benefits of medications for prevention of heart attack, stroke and kidney damage; Daily salt intake goal < 2300 mg; Importance of home blood pressure monitoring; -Counseled to monitor BP at home daily, document, and provide log at future appointments -Counseled on diet and exercise extensively Recommended to continue current medication  Hyperlipidemia/CAD: (LDL goal < 55) -Patient had a NSTEMI in March 2010 and underwent DES to left  circumflex.  She had another NSTEMI in May 2011 secondary to thrombotic RCA lesion, cardiac catheterization showed nonobstructive disease, medical therapy was recommended.  She was admitted for recurrent chest pain in January 2014, Myoview was negative.  She was admitted for acute CVA in October 2015 after presenting with slurred speech and facial drooping.  MRI showed acute right MCA infarct involving basal ganglia and periventricular white matter.  She was started on aspirin along with Plavix.  Myoview obtained in August 2017 showed no ischemia, normal EF.   ABI obtained in July 2018 was normal.    -She was seen by Almyra Deforest PA-C on 03/13/2018  with chest pain.  A Myoview study on 03/25/2018 which showed EF 52%.    -She was seen in the ED on 10/19/20 with acute mid thoracic pain. Started left scapula and moved to the right. She states Ntg did help. Troponin was normal x 2. Ecg showed mild ST depression in leads V5-6. CXR normal. Other labs normal. Pain lasted about 30 minutes  -When seen last year she noted that she has had pain across her shoulder blades and upper back for several months. It is worse with activity. She does get relief with pain medication and Ntg.   -When seen in June she ws having worsening chest pain. She has been under a lot of stress. She was caring for her brother and he died this past month with lung CA. She has complained of chest pain since then. Worse when stressed. Some relief with sl Ntg but she doesn't like to take. She notes pain across her anterior chest and into her back. Doesn't feel well. Has trouble staying awake. Fingers are numb. Notes toes are blue. She underwent cardiac cath which showed no significant obstructive CAD. She  also had follow up LE arterial vascular dopplers which were stable with good ABIs.  -uncontrolled -Current treatment: rosuvastatin 20 mg daily at bedtime Appropriate, Effective, Safe, Accessible Fenofibrate 160 mg daily am Appropriate, Effective, Safe,  Accessible Plavix 75 mg daily Appropriate, Effective, Safe, Accessible Vascepa 1 gram 2 capsules bid Appropriate, Effective, Safe, Accessible -Medications previously tried: none reported -Current dietary patterns: cooks at home. Eats meatloaf, pinto beans, stewed potatoes, homemade biscuits -Current exercise habits: limited due to back pain and shortness of breath  -Educated on Cholesterol goals;  Benefits of statin for ASCVD risk reduction; Importance of limiting foods high in cholesterol; -Counseled on diet and exercise extensively Recommended to continue current medication.   Diabetes (A1c goal <7%) -controlled -Current medications: Onetouch Verio Trulicity 4.5 mg weekly  -Medications previously tried: none reported today  -Current home glucose readings fasting glucose:  Sept 2023: 05/26/22: 183 05/27/22: 167 05/30/22: 149 05/31/22: 170 06/01/22: 170 06/02/22: 161 06/03/22: 145 06/04/22: 123 06/05/22: 146 06/06/22: 126 06/07/22: 150 -Denies hypoglycemic/hyperglycemic symptoms -Current meal patterns:  breakfast: fruit  Lunch and supper:  variety of meat and vegtables drinks: nature's twist, water, diet drinks -Current exercise: limited due to back pain and breathing -Educated onA1c and blood sugar goals; Complications of diabetes including kidney damage, retinal damage, and cardiovascular disease; Benefits of routine self-monitoring of blood sugar; -Counseled to check feet daily and get yearly eye exams -Counseled on diet and exercise extensively Sept 2023: PCP increased Trulicity but patient hasn't started yet. Will wait to see results of increased dose  Depression/Anxiety (Goal: manage symptoms) -Uncontrolled -Current treatment: Lamotrigine 100 mg daily  Alprazolam 0.5 mg tid prn  Venlafaxine XR '75mg'$  -Medications previously tried/failed: Abilify  -PHQ9:     01/24/2022    9:16 AM 10/18/2021   10:44 PM 09/07/2021   11:05 AM  Depression screen PHQ 2/9  Decreased Interest '2 3 3   '$ Down, Depressed, Hopeless '3 2 2  '$ PHQ - 2 Score '5 5 5  '$ Altered sleeping '2 3 3  '$ Tired, decreased energy '3 3 3  '$ Change in appetite 3 0 0  Feeling bad or failure about yourself  '3 2 2  '$ Trouble concentrating '2 3 3  '$ Moving slowly or fidgety/restless 0 2 2  Suicidal thoughts 0 0 0  PHQ-9 Score '18 18 18  '$ Difficult doing work/chores Somewhat difficult Somewhat difficult   -GAD7:     05/08/2021   11:04 AM 08/10/2020    1:33 PM  GAD 7 : Generalized Anxiety Score  Nervous, Anxious, on Edge 3 3  Control/stop worrying 3 3  Worry too much - different things 3 3  Trouble relaxing 3 3  Restless 1 3  Easily annoyed or irritable 3 3  Afraid - awful might happen 3 3  Total GAD 7 Score 19 21  Anxiety Difficulty  Very difficult  -Recommended connecting with counselor or psychiatry for mental health support - patient declines and states she talks to her dog.  -Educated on Benefits of medication for symptom control October 2022: Patient stated her brother was given 5 months to live upon Cancer Dx and October is month 5. Spent the majority of the visit listening to her talk about her mental health Sept 2023: Patient doing better mentally. Her grandson is visiting her in [place] and she was making him some chicken when I called   Osteoporosis / Osteopenia (Goal prevent fracture or broken bones) -controlled -Last DEXA Scan:  T-Score femoral neck: -2.3  T-Score lumbar spine: -2.1  10-year probability of major osteoporotic fracture: 12.4%  10-year probability of hip fracture: 2.4% -Patient is not a candidate for pharmacologic treatment -Current treatment  Diet and lifestyle -Medications previously tried: none reported  -Recommend [phone] units of vitamin D daily. Recommend 1200 mg of calcium daily from dietary and supplemental sources. Recommend weight-bearing and muscle strengthening exercises for building and maintaining bone density. -Counseled on diet and exercise extensively  Smoking Cessation  (Goal: Quit) -Uncontrolled -Current treatment  None -Medications previously tried: None  Sept 2023: Patient is trying to cut back at this time. She can go an hour without smoking which is good for her. Her Brother passed May 4th 2023 of lung cancer and that inspired her to quit  Chronic Pain -Not ideally controlled -Current treatment: Diclofenac Gel 1% Appropriate, Effective, Safe, Accessible Gabapentin '300mg'$  Appropriate, Effective, Safe, Accessible Lamotrigine '50mg'$  HS Appropriate, Effective, Safe, Accessible Methocarbamol Appropriate, Query effective, ,  Oxy/APAp Appropriate, Effective, Safe, Accessible -Medications previously tried: Tizanidine (Didn't help)  -Pain Scale There were no vitals filed for this visit.  Aggravating Factors: Movement  Pain Type: chronic  Sept 2023: Patient wouldn't give pain scale but stated she is afraid to take Methocarbamol because she's heard it has some side effects, also it's significantly more expensive than her other meds. Will call pain provider and let them know     Patient Goals/Self-Care Activities Over the next 90 days, patient will:  - take medications as prescribed check glucose daily, document, and provide at future appointments check blood pressure daily, document, and provide at future appointments  Follow Up Plan: Telephone follow up appointment with care management team member scheduled for: Jan 2024  [place] Constable, Pharm.D. - [phone]      Ms. Fendley was given information about Chronic Care Management services today including:  CCM service includes personalized support from designated clinical staff supervised by her physician, including individualized plan of care and coordination with other care providers 24/7 contact phone numbers for assistance for urgent and routine care needs. Standard insurance, coinsurance, copays and deductibles apply for chronic care management only during months in which we provide at least 20  minutes of these services. Most insurances cover these services at 100%, however patients may be responsible for any copay, coinsurance and/or deductible if applicable. This service may help you avoid the need for more expensive face-to-face services. Only one practitioner may furnish and bill the service in a calendar month. The patient may stop CCM services at any time (effective at the end of the month) by phone call to the office staff.  Patient agreed to services and verbal consent obtained.   The patient verbalized understanding of instructions, educational materials, and care plan provided today and DECLINED offer to receive copy of patient instructions, educational materials, and care plan.  The pharmacy team will reach out to the patient again over the next 60 days.   Lane Hacker, [place]

## 2022-06-08 NOTE — Progress Notes (Signed)
06/08/22- Spoke with one of the nurses at the pain clinic, Asencion Partridge and I gave her the message and she will send this to the provider and advise with the pt on this.   [name], [place] [facility] Pharmacist Assistant  [phone]

## 2022-06-11 ENCOUNTER — Encounter: Payer: Self-pay | Admitting: Family Medicine

## 2022-06-11 ENCOUNTER — Ambulatory Visit (INDEPENDENT_AMBULATORY_CARE_PROVIDER_SITE_OTHER): Payer: Medicare Other | Admitting: Family Medicine

## 2022-06-11 ENCOUNTER — Telehealth: Payer: Self-pay

## 2022-06-11 DIAGNOSIS — E1140 Type 2 diabetes mellitus with diabetic neuropathy, unspecified: Secondary | ICD-10-CM

## 2022-06-11 DIAGNOSIS — I110 Hypertensive heart disease with heart failure: Secondary | ICD-10-CM

## 2022-06-11 DIAGNOSIS — I119 Hypertensive heart disease without heart failure: Secondary | ICD-10-CM | POA: Diagnosis not present

## 2022-06-11 DIAGNOSIS — R600 Localized edema: Secondary | ICD-10-CM

## 2022-06-11 DIAGNOSIS — I5042 Chronic combined systolic (congestive) and diastolic (congestive) heart failure: Secondary | ICD-10-CM

## 2022-06-11 DIAGNOSIS — E782 Mixed hyperlipidemia: Secondary | ICD-10-CM

## 2022-06-11 DIAGNOSIS — I5032 Chronic diastolic (congestive) heart failure: Secondary | ICD-10-CM | POA: Diagnosis not present

## 2022-06-11 DIAGNOSIS — R0609 Other forms of dyspnea: Secondary | ICD-10-CM

## 2022-06-11 MED ORDER — FUROSEMIDE 40 MG PO TABS: 40.0000 mg | ORAL_TABLET | Freq: Every day | ORAL | 0 refills | Status: DC

## 2022-06-11 MED ORDER — POTASSIUM CHLORIDE CRYS ER 20 MEQ PO TBCR: 20.0000 meq | EXTENDED_RELEASE_TABLET | Freq: Every day | ORAL | 0 refills | Status: DC

## 2022-06-11 NOTE — Telephone Encounter (Signed)
Compliant on meds 

## 2022-06-11 NOTE — Patient Instructions (Signed)
Increase furosemide to 40 mg daily.  Add potassium chloride 20 meq once daily.

## 2022-06-11 NOTE — Progress Notes (Unsigned)
Acute Office Visit  Subjective:    Patient ID: Sonya Small, female    DOB: 1951-02-01, 71 y.o.   MRN: [id number]  Chief Complaint  Patient presents with   Bilateral swelling in legs    HPI Patient is in today for left leg Edema x few months and right leg started swelling last week after hitting her right foot on a wheelchair. Patient states she has tried elevating both legs, and she also tried wearing compression stockings that has also not worked.  Patient is trying to quit smoking. She is weaning down. Going to start nicoderm patch on 24th.    Patient is due to return for diabetes in the next week. She is fasting today. Will check labs today. Sugars have been up.   Past Medical History:  Diagnosis Date   Anxiety    CAD (coronary artery disease)    a. NSTEMI 11/2008 s/p DES to LCx (3.0x12 Xience); b. NSTEMI 01/2010 secondary to thrombotic RCA lesion (non-obstructive)-->med rx (integrilin x 24 hrs + plavix); c. 09/2012 negative Myoview.   Chronic diastolic CHF (congestive heart failure) ([place])    a. 06/2014 Echo: EF 55-60%, no rwma, Gr1 DD, mild AI.   Depression    Dizziness    Drug induced constipation    Ganglion cyst of left foot 01/17/2021   Generalized hyperhidrosis    GERD (gastroesophageal reflux disease)    Headache    Hyperlipidemia    Hypertensive heart disease    Lumbar disc disease    Metabolic encephalopathy    Mixed hyperlipidemia    Myocardial infarction ([place])    Obstructive sleep apnea    OP (osteoporosis)    Osteoarthritis    Osteoporosis    Other malaise    Overweight(278.02)    PAD (peripheral artery disease) ([place])    a. Emboli to R foot 2010 from partially occlusive lesion in R EIA, s/p stenting. - followed by Dr. Donnetta Hutching;  b. 10/2015 ABIs: R 1.03, L 0.97.   Restless leg    Sleep apnea    Stroke [facility])    TIA (transient ischemic attack)    Tobacco abuse    Urge incontinence     Past Surgical History:  Procedure Laterality Date   CYST EXCISION Left  01/2021   left foot   EYE SURGERY     at age 59   hysterectomy -age 89     ILIAC ARTERY STENT     RIGHT ILIAC STENT   KNEE ARTHROSCOPY     LEFT HEART CATH AND CORONARY ANGIOGRAPHY N/A 03/05/2022   Procedure: LEFT HEART CATH AND CORONARY ANGIOGRAPHY;  Surgeon: [name], MD;  Location: [facility];  Service: Cardiovascular;  Laterality: N/A;   LITHOTRIPSY Left 12/2020   LUMBAR LAMINECTOMY     TUBAL LIGATION      Family History  Problem Relation Age of Onset   Alzheimer's disease Mother    Hodgkin's lymphoma Brother    Lung cancer Brother    Hepatitis C Brother    Hypothyroidism Brother    Hypertension Brother    Depression Brother    Heart disease Other        Grandfather    Social History   Socioeconomic History   Marital status: Divorced    Spouse name: Not on file   Number of children: 3   Years of education: 10 th   Highest education level: Not on file  Occupational History   Occupation: DISABLED  Employer: UNEMPLOYED  Tobacco Use   Smoking status: Every Day    Packs/day: 0.50    Years: 54.00    Total pack years: 27.00    Types: Cigarettes    Last attempt to quit: 11/20/2012    Years since quitting: 9.5   Smokeless tobacco: Never   Tobacco comments:    2 ppd for many years.     04/13/2022 Patient smokes 1/2 pack daily or more if she is nervous  Substance and Sexual Activity   Alcohol use: No    Alcohol/week: 0.0 standard drinks of alcohol   Drug use: No   Sexual activity: Never  Other Topics Concern   Not on file  Social History Narrative   Patient is single with 3 children, 1 deceased.   Patient is right handed.   Patient has 10 th grade education.   Patient drinks 5 or more cups daily.   Social Determinants of Health   Financial Resource Strain: High Risk (06/07/2022)   Overall Financial Resource Strain (CARDIA)    Difficulty of Paying Living Expenses: Hard  Food Insecurity: No Food Insecurity (08/10/2020)   Hunger Vital Sign     Worried About Running Out of Food in the Last Year: Never true    Ran Out of Food in the Last Year: Never true  Transportation Needs: No Transportation Needs (06/07/2022)   PRAPARE - Hydrologist (Medical): No    Lack of Transportation (Non-Medical): No  Physical Activity: Not on file  Stress: Stress Concern Present (01/19/2020)   [place]    Feeling of Stress : Rather much  Social Connections: Not on file  Intimate Partner Violence: Not on file    Outpatient Medications Prior to Visit  Medication Sig Dispense Refill   albuterol (PROVENTIL) (2.5 MG/3ML) 0.083% nebulizer solution Take 2.5 mg by nebulization every 6 (six) hours as needed for wheezing or shortness of breath.     albuterol (VENTOLIN HFA) 108 (90 Base) MCG/ACT inhaler Inhale 2 puffs into the lungs every 4 (four) hours as needed for wheezing or shortness of breath.     ALPRAZolam (XANAX) 0.5 MG tablet Take 1 tablet (0.5 mg total) by mouth 3 (three) times daily. 90 tablet 2   Aspirin-Acetaminophen (GOODYS BODY PAIN PO) Take 1 packet by mouth daily.     blood glucose meter kit and supplies KIT Dispense based on patient and insurance preference. Use up to four times daily as directed. 1 each 0   BREO ELLIPTA 100-25 MCG/ACT AEPB Inhale 1 puff into the lungs daily.     clopidogrel (PLAVIX) 75 MG tablet Take 1 tablet (75 mg total) by mouth every morning. 90 tablet 2   diclofenac Sodium (VOLTAREN) 1 % GEL Apply 4 g topically daily as needed (Hip and leg pain). 100 g 1   Dulaglutide 4.5 MG/0.5ML SOPN Inject 4.5 mg into the skin once a week. 2 mL 2   fenofibrate 160 MG tablet TAKE ONE CAPSULE BY MOUTH EVERY MORNING 90 tablet 2   FETZIMA 40 MG CP24 TAKE ONE CAPSULE BY MOUTH ONCE DAILY 30 capsule 2   fluticasone (FLONASE) 50 MCG/ACT nasal spray INSTILL 1 SPRAY IN EACH NOSTRIL ONCE DAILY (Patient taking differently: Place 1 spray into both  nostrils daily as needed for rhinitis.) 16 g 1   gabapentin (NEURONTIN) 300 MG capsule TAKE ONE CAPSULE BY MOUTH EVERYDAY AT BEDTIME 30 capsule 2   glucose blood (ONETOUCH  VERIO) test strip Use as instructed 100 strip 3   icosapent Ethyl (VASCEPA) 1 g capsule Take 2 capsules (2 g total) by mouth 2 (two) times daily. 360 capsule 1   lamoTRIgine (LAMICTAL) 25 MG tablet Take 2 tablets (50 mg total) by mouth at bedtime. 180 tablet 1   lansoprazole (PREVACID) 30 MG capsule TAKE ONE CAPSULE BY MOUTH BEFORE BREAKFAST 90 capsule 3   Levomilnacipran HCl ER 20 MG CP24 Take 20 mg by mouth 2 (two) times daily. (Patient not taking: Reported on 06/11/2022) 60 capsule 2   linaclotide (LINZESS) 145 MCG CAPS capsule Take 1 capsule (145 mcg total) by mouth daily before breakfast. 12 capsule 0   lisinopril-hydrochlorothiazide (ZESTORETIC) 20-25 MG tablet Take 1 tablet by mouth daily. 30 tablet 3   methocarbamol (ROBAXIN) 750 MG tablet Take 750 mg by mouth 3 (three) times daily.     montelukast (SINGULAIR) 10 MG tablet Take 10 mg by mouth daily.     mupirocin ointment (BACTROBAN) 2 % Apply 1 application topically 2 (two) times daily. (Patient taking differently: Apply 1 application  topically daily as needed (Rash).) 30 g 0   naloxone (NARCAN) nasal spray 4 mg/0.1 mL SMARTSIG:1 Both Nares Daily     nitroGLYCERIN (NITROSTAT) 0.4 MG SL tablet Place 1 tablet (0.4 mg total) under the tongue every 5 (five) minutes as needed. 25 tablet 1   oxyCODONE-acetaminophen (PERCOCET) 10-325 MG tablet Take 1 tablet by mouth every 6 (six) hours as needed for pain.     rosuvastatin (CRESTOR) 20 MG tablet TAKE ONE TABLET BY MOUTH EVERYDAY AT BEDTIME 90 tablet 2   tiZANidine (ZANAFLEX) 4 MG tablet Take 2 mg by mouth daily as needed for muscle spasms.     TRUEplus Lancets 30G MISC 1 each by Does not apply route 2 (two) times daily. E11.69 100 each 3   valACYclovir (VALTREX) 1000 MG tablet TAKE TWO TABLETS BY MOUTH twice A DAY as needed 20  tablet 2   furosemide (LASIX) 20 MG tablet Take 1 tablet (20 mg total) by mouth daily. 30 tablet 2   Facility-Administered Medications Prior to Visit  Medication Dose Route Frequency Provider Last Rate Last Admin   sodium chloride flush (NS) 0.9 % injection 3 mL  3 mL Intravenous Q12H [name], [name], MD        Allergies  Allergen Reactions   Abilify [Aripiprazole]     confusion   Latex Rash    Review of Systems  Constitutional:  Negative for appetite change, fatigue and fever.  HENT:  Negative for congestion, ear pain, sinus pressure and sore throat.   Respiratory:  Negative for cough, chest tightness, shortness of breath and wheezing.   Cardiovascular:  Positive for leg swelling (Bilateral leg and ankle swelling 2-3 weeks.). Negative for chest pain and palpitations.  Gastrointestinal:  Negative for abdominal pain, constipation, diarrhea, nausea and vomiting.  Genitourinary:  Negative for dysuria and hematuria.  Musculoskeletal:  Negative for arthralgias, back pain, joint swelling and myalgias.  Skin:  Negative for rash.  Neurological:  Negative for dizziness, weakness and headaches.  Psychiatric/Behavioral:  Negative for dysphoric mood. The patient is not nervous/anxious.        Objective:    Physical Exam Vitals reviewed.  Constitutional:      Appearance: Normal appearance. She is normal weight.  Cardiovascular:     Rate and Rhythm: Normal rate and regular rhythm.     Heart sounds: Normal heart sounds.  Pulmonary:  Effort: Pulmonary effort is normal.     Breath sounds: Normal breath sounds.  Abdominal:     General: Abdomen is flat. Bowel sounds are normal.     Palpations: Abdomen is soft.     Tenderness: There is no abdominal tenderness.  Musculoskeletal:     Right lower leg: Edema present.     Left lower leg: Edema present.  Neurological:     Mental Status: She is alert and oriented to person, place, and time.  Psychiatric:        Mood and Affect: Mood  normal.        Behavior: Behavior normal.     There were no vitals taken for this visit. Wt Readings from Last 3 Encounters:  04/13/22 159 lb 6.4 oz (72.3 kg)  03/16/22 163 lb (73.9 kg)  03/05/22 161 lb (73 kg)    Health Maintenance Due  Topic Date Due   Hepatitis C Screening  Never done   TETANUS/TDAP  Never done   COLONOSCOPY (Pts 45-104yr Insurance coverage will need to be confirmed)  Never done   MAMMOGRAM  Never done   Zoster Vaccines- Shingrix (1 of 2) Never done   Pneumonia Vaccine 73 Years old (3 - PPSV23 or PCV20) 06/25/2016   COVID-19 Vaccine (2 - Moderna series) 09/26/2020   FOOT EXAM  11/25/2021   INFLUENZA VACCINE  04/24/2022    There are no preventive care reminders to display for this patient.   Lab Results  Component Value Date   TSH 0.336 (L) 06/11/2022   Lab Results  Component Value Date   WBC 8.5 06/11/2022   HGB 13.8 06/11/2022   HCT 43.1 06/11/2022   MCV 91 06/11/2022   PLT 261 06/11/2022   Lab Results  Component Value Date   NA 140 06/11/2022   K 3.8 06/11/2022   CO2 24 06/11/2022   GLUCOSE 315 (H) 06/11/2022   BUN 29 (H) 06/11/2022   CREATININE 0.79 06/11/2022   BILITOT <0.2 06/11/2022   ALKPHOS 81 06/11/2022   AST 14 06/11/2022   ALT 16 06/11/2022   PROT 6.9 06/11/2022   ALBUMIN 4.2 06/11/2022   CALCIUM 10.0 06/11/2022   ANIONGAP 10 10/19/2020   EGFR 79 06/11/2022   Lab Results  Component Value Date   CHOL 147 03/01/2022   Lab Results  Component Value Date   HDL 41 03/01/2022   Lab Results  Component Value Date   LDLCALC 82 03/01/2022   Lab Results  Component Value Date   TRIG 134 03/01/2022   Lab Results  Component Value Date   CHOLHDL 3.6 03/01/2022   Lab Results  Component Value Date   HGBA1C 8.5 (H) 06/11/2022         Assessment & Plan:   Problem List Items Addressed This Visit       Cardiovascular and Mediastinum   Chronic diastolic heart failure (HCC) (Chronic)    Increase furosemide to 40 mg  daily.  Add potassium chloride 20 meq once daily.       Relevant Medications   furosemide (LASIX) 40 MG tablet   Other Relevant Orders   CBC with Differential/Platelet (Completed)   Comprehensive metabolic panel (Completed)   TSH (Completed)   Hypertensive heart disease with chronic combined systolic and diastolic congestive heart failure (HCC)   Relevant Medications   furosemide (LASIX) 40 MG tablet     Endocrine   Type 2 diabetes mellitus with diabetic neuropathy, without long-term current use of insulin ([place] - [facility]  Check labs.       Relevant Orders   Hemoglobin A1c (Completed)   TSH (Completed)     Other   Mixed hyperlipidemia   Relevant Medications   furosemide (LASIX) 40 MG tablet   Other Visit Diagnoses     Hypertensive heart disease without heart failure       Relevant Medications   furosemide (LASIX) 40 MG tablet   potassium chloride SA (KLOR-CON M) 20 MEQ tablet   Localized edema       Relevant Orders   TSH (Completed)   Dyspnea on exertion           There are no diagnoses linked to this encounter.   Meds ordered this encounter  Medications   furosemide (LASIX) 40 MG tablet    Sig: Take 1 tablet (40 mg total) by mouth daily.    Dispense:  90 tablet    Refill:  0   potassium chloride SA (KLOR-CON M) 20 MEQ tablet    Sig: Take 1 tablet (20 mEq total) by mouth daily.    Dispense:  90 tablet    Refill:  0    I,Lauren M Auman,acting as a scribe for [name], MD.,have documented all relevant documentation on the behalf of [name], MD,as directed by  [name], MD while in the presence of [name], MD.     [name], MD

## 2022-06-11 NOTE — Progress Notes (Signed)
Chronic Care Management Pharmacy Assistant   Name: SHARMAYNE JABLON  MRN: [id number] DOB: October 06, 1950   Reason for Encounter: Medication Coordination for [facility]    Recent office visits:  None  Recent consult visits:  None  Hospital visits:  None  Medications: Outpatient Encounter Medications as of 06/11/2022  Medication Sig Note   albuterol (PROVENTIL) (2.5 MG/3ML) 0.083% nebulizer solution Take 2.5 mg by nebulization every 6 (six) hours as needed for wheezing or shortness of breath.    albuterol (VENTOLIN HFA) 108 (90 Base) MCG/ACT inhaler Inhale 2 puffs into the lungs every 4 (four) hours as needed for wheezing or shortness of breath.    ALPRAZolam (XANAX) 0.5 MG tablet Take 1 tablet (0.5 mg total) by mouth 3 (three) times daily.    Aspirin-Acetaminophen (GOODYS BODY PAIN PO) Take 1 packet by mouth daily.    blood glucose meter kit and supplies KIT Dispense based on patient and insurance preference. Use up to four times daily as directed.    BREO ELLIPTA 100-25 MCG/ACT AEPB Inhale 1 puff into the lungs daily.    clopidogrel (PLAVIX) 75 MG tablet Take 1 tablet (75 mg total) by mouth every morning.    diclofenac Sodium (VOLTAREN) 1 % GEL Apply 4 g topically daily as needed (Hip and leg pain).    Dulaglutide 4.5 MG/0.5ML SOPN Inject 4.5 mg into the skin once a week.    fenofibrate 160 MG tablet TAKE ONE CAPSULE BY MOUTH EVERY MORNING    FETZIMA 40 MG CP24 TAKE ONE CAPSULE BY MOUTH ONCE DAILY    fluticasone (FLONASE) 50 MCG/ACT nasal spray INSTILL 1 SPRAY IN EACH NOSTRIL ONCE DAILY (Patient taking differently: Place 1 spray into both nostrils daily as needed for rhinitis.)    furosemide (LASIX) 20 MG tablet Take 1 tablet (20 mg total) by mouth daily.    gabapentin (NEURONTIN) 300 MG capsule TAKE ONE CAPSULE BY MOUTH EVERYDAY AT BEDTIME    glucose blood (ONETOUCH VERIO) test strip Use as instructed    icosapent Ethyl (VASCEPA) 1 g capsule Take 2 capsules (2 g total) by mouth 2 (two)  times daily.    lamoTRIgine (LAMICTAL) 25 MG tablet Take 2 tablets (50 mg total) by mouth at bedtime.    lansoprazole (PREVACID) 30 MG capsule TAKE ONE CAPSULE BY MOUTH BEFORE BREAKFAST    Levomilnacipran HCl ER 20 MG CP24 Take 20 mg by mouth 2 (two) times daily.    linaclotide (LINZESS) 145 MCG CAPS capsule Take 1 capsule (145 mcg total) by mouth daily before breakfast.    lisinopril-hydrochlorothiazide (ZESTORETIC) 20-25 MG tablet Take 1 tablet by mouth daily.    methocarbamol (ROBAXIN) 750 MG tablet Take 750 mg by mouth 3 (three) times daily. 04/13/2022: Patient takes 1 tablet at night   montelukast (SINGULAIR) 10 MG tablet Take 10 mg by mouth daily.    mupirocin ointment (BACTROBAN) 2 % Apply 1 application topically 2 (two) times daily. (Patient taking differently: Apply 1 application  topically daily as needed (Rash).)    naloxone (NARCAN) nasal spray 4 mg/0.1 mL SMARTSIG:1 Both Nares Daily    nitroGLYCERIN (NITROSTAT) 0.4 MG SL tablet Place 1 tablet (0.4 mg total) under the tongue every 5 (five) minutes as needed.    oxyCODONE-acetaminophen (PERCOCET) 10-325 MG tablet Take 1 tablet by mouth every 6 (six) hours as needed for pain.    rosuvastatin (CRESTOR) 20 MG tablet TAKE ONE TABLET BY MOUTH EVERYDAY AT BEDTIME    tiZANidine (ZANAFLEX) 4 MG tablet  Take 2 mg by mouth daily as needed for muscle spasms.    TRUEplus Lancets 30G MISC 1 each by Does not apply route 2 (two) times daily. E11.69    valACYclovir (VALTREX) 1000 MG tablet TAKE TWO TABLETS BY MOUTH twice A DAY as needed    Facility-Administered Encounter Medications as of 06/11/2022  Medication   sodium chloride flush (NS) 0.9 % injection 3 mL    Reviewed chart for medication changes ahead of medication coordination call.  No OVs, Consults, or hospital visits since last care coordination call/Pharmacist visit.   No medication changes indicated OR if recent visit, treatment plan here.  BP Readings from Last 3 Encounters:   04/13/22 (!) 142/76  03/16/22 140/72  03/05/22 (!) 153/76    Lab Results  Component Value Date   HGBA1C 7.4 (H) 01/24/2022     Patient obtains medications through Adherence Packaging  30 Days   Last adherence delivery included: Montelukast 72m 1 B Lisinopril-HCTZ 20-267m1 B Vascepa 1gm 2 B 2 EM Ventolin Inhaler -2 puffs every 4-6 hours as needed  Fetzima ER 4089m B Lansoprazole 84m90mBB Fenofibrate 160mg75m Rosuvastatin 20mg 91m Clopidogrel 75mg 121mamotrigine 25mg 2 32mabapentin 300mg 1 B59m    Furosemide 20mg 1 B 88mrex 1000mg-TAKE 45mTABLETS BY MOUTH twice A DAY FOR ONE DAY as needed  Patient declined (meds) last month  Trulicity 3mg-Pt stil8mas 2 boxes left  Valtrex 1000mg- Pt doe55mt need at this time Breo Ellipta Inhaler 100-25- Pt has enough on hand    Xanax 0.5 mg 3 times daily - Got [facility] fill at [facility] on [facility]22 for 30days.  Linaclotide 145mcg- Receiv2mamples from Office  Tizanidine 0.5 mg 2 times daily- Does not need gets [facility]  Flonase Nasal Spray- Plenty of supply only uses as needed  Nitroglycerin 0.4- Has enough. Only uses prn  Mupirocin Ointment 2% - Only uses prn. Last fill 10/19/21 30ds. Only uses prn  Test Strips/Lancets- [facility]. Cannot be billed through her insurance  Diclofenac gel- over supply Methocarbamol 750mg-Filled on42m/23 30ds through [facility]  Patient is due for next adherence delivery on: 06/21/22. Called patient and reviewed medications and coordinated delivery.  This delivery to include: Montelukast 10mg 1 B Lisino52m-HCTZ 20-25mg 1 B Vascepa84m 2 B 2 EM Fetzima ER 40mg 1 B Lansopra60m 84mg 1 BB Fenofibr35m160mg 1 B Rosuvastat76m0mg 1 BT Clopidogre49mmg 1 B Lamotrigine 28m 2 BT Gabapentin 368m 1 BT       Furosem54m20mg 1 B  Patient declin49mhe following medications  Valtrex 1000mg-Does not need this d36mery, only uses prn  Ventolin Inhaler -Pt still has enough Trulicity 3mg-This was set to do an 64mte  fill on 06/18/22 Breo Ellipta Inhaler 100-25- Pt has enough on hand    Xanax 0.5 mg 3 times daily - [facility] for [facility]days.  Linaclotide 145mcg- Received samples fro59mfice  Tizanidine 0.5 mg 2 times daily- Does not need [facility]  Flonase Nasal Spray- Plenty of supply only uses as needed  Nitroglycerin 0.4- Has enough. Only uses prn  Mupirocin Ointment 2% - Only uses prn. Last fill 10/19/21 30ds. Only uses prn  Test Strips/Lancets- [facility]. Cannot be billed through her insurance  Diclofenac gel- over supply Methocarbamol 750mg-Pain management D/C thi71matient needs refills  Montelukast 10mg-[facility] sent request    26mirmed delivery date of  06/11/22, advised patient that pharmacy will contact them the morning of delivery.   [name], [place] Pharmacist Assistant  [phone]

## 2022-06-12 LAB — CBC WITH DIFFERENTIAL/PLATELET
Basophils Absolute: 0 10*3/uL (ref 0.0–0.2)
Basos: 1 %
EOS (ABSOLUTE): 0.1 10*3/uL (ref 0.0–0.4)
Eos: 1 %
Hematocrit: 43.1 % (ref 34.0–46.6)
Hemoglobin: 13.8 g/dL (ref 11.1–15.9)
Immature Grans (Abs): 0.1 10*3/uL (ref 0.0–0.1)
Immature Granulocytes: 1 %
Lymphocytes Absolute: 2 10*3/uL (ref 0.7–3.1)
Lymphs: 24 %
MCH: 29 pg (ref 26.6–33.0)
MCHC: 32 g/dL (ref 31.5–35.7)
MCV: 91 fL (ref 79–97)
Monocytes Absolute: 0.5 10*3/uL (ref 0.1–0.9)
Monocytes: 6 %
Neutrophils Absolute: 5.7 10*3/uL (ref 1.4–7.0)
Neutrophils: 67 %
Platelets: 261 10*3/uL (ref 150–450)
RBC: 4.76 x10E6/uL (ref 3.77–5.28)
RDW: 12.2 % (ref 11.7–15.4)
WBC: 8.5 10*3/uL (ref 3.4–10.8)

## 2022-06-12 LAB — COMPREHENSIVE METABOLIC PANEL
ALT: 16 IU/L (ref 0–32)
AST: 14 IU/L (ref 0–40)
Albumin/Globulin Ratio: 1.6 (ref 1.2–2.2)
Albumin: 4.2 g/dL (ref 3.8–4.8)
Alkaline Phosphatase: 81 IU/L (ref 44–121)
BUN/Creatinine Ratio: 37 — ABNORMAL HIGH (ref 12–28)
BUN: 29 mg/dL — ABNORMAL HIGH (ref 8–27)
Bilirubin Total: <0.2 mg/dL (ref 0.0–1.2)
CO2: 24 mmol/L (ref 20–29)
Calcium: 10 mg/dL (ref 8.7–10.3)
Chloride: 101 mmol/L (ref 96–106)
Creatinine, Ser: 0.79 mg/dL (ref 0.57–1.00)
Globulin, Total: 2.7 g/dL (ref 1.5–4.5)
Glucose: 315 mg/dL — ABNORMAL HIGH (ref 70–99)
Potassium: 3.8 mmol/L (ref 3.5–5.2)
Sodium: 140 mmol/L (ref 134–144)
Total Protein: 6.9 g/dL (ref 6.0–8.5)
eGFR: 79 mL/min/{1.73_m2} (ref >59)

## 2022-06-12 LAB — TSH: TSH: 0.336 u[IU]/mL — ABNORMAL LOW (ref 0.450–4.500)

## 2022-06-12 LAB — HEMOGLOBIN A1C
Est. average glucose Bld gHb Est-mCnc: 197 mg/dL
Hgb A1c MFr Bld: 8.5 % — ABNORMAL HIGH (ref 4.8–5.6)

## 2022-06-12 NOTE — Progress Notes (Signed)
Blood count normal.  Liver function normal.  Kidney function normal.  Thyroid function abnormal. Add on Free T4.  Cholesterol: LDL goal is less than 55. Patient has heart disease so I would recommend add zetia 10 mg daily.  HBA1C: worsened to 8.5. Sugar was over 300. Please review medications. I think she may be out of her medicines.

## 2022-06-13 ENCOUNTER — Other Ambulatory Visit: Payer: Self-pay

## 2022-06-13 MED ORDER — EZETIMIBE 10 MG PO TABS: 10.0000 mg | ORAL_TABLET | Freq: Every day | ORAL | 1 refills | Status: DC

## 2022-06-13 NOTE — Assessment & Plan Note (Signed)
Check labs 

## 2022-06-13 NOTE — Assessment & Plan Note (Signed)
Increase furosemide to 40 mg daily.  Add potassium chloride 20 meq once daily.

## 2022-06-14 LAB — SPECIMEN STATUS REPORT

## 2022-06-14 LAB — T4, FREE: Free T4: 1.21 ng/dL (ref 0.82–1.77)

## 2022-06-15 ENCOUNTER — Other Ambulatory Visit: Payer: Self-pay

## 2022-06-15 MED ORDER — TOUJEO SOLOSTAR 300 UNIT/ML ~~LOC~~ SOPN: 10.0000 [IU] | PEN_INJECTOR | Freq: Every day | SUBCUTANEOUS | 1 refills | Status: DC

## 2022-06-18 ENCOUNTER — Other Ambulatory Visit: Payer: Self-pay

## 2022-06-18 ENCOUNTER — Telehealth: Payer: Self-pay

## 2022-06-18 MED ORDER — INSULIN DEGLUDEC 200 UNIT/ML ~~LOC~~ SOPN: 10.0000 [IU] | PEN_INJECTOR | Freq: Every day | SUBCUTANEOUS | Status: DC

## 2022-06-18 NOTE — Chronic Care Management (AMB) (Signed)
Prior Authorization Coordination  Received message from [facility]: Touejo was sent in for her and it is rejecting requiring a PA but it also says to try basaglar, tresiba, or levemir. I will submit PA to cover my meds and to Dr [name] please advise.  Prior authorization submitted through Covermymeds for Toujeo, awaiting determination.  Patient has been approved: As a member of [employer] ([place]), we are pleased to inform you that, upon review of the information provided by you or your doctor, we have approved the requested coverage for the following prescription drug(s): TOUJEO SOLOSTAR Soln Pen-inj Type of coverage approved: Non-Formulary This approval authorizes your coverage from 09/24/2021 - 06/18/2023  [facility] notified.     Pattricia Boss, [place] Pharmacist Assistant [phone]

## 2022-06-23 DIAGNOSIS — E785 Hyperlipidemia, unspecified: Secondary | ICD-10-CM | POA: Diagnosis not present

## 2022-06-23 DIAGNOSIS — M810 Age-related osteoporosis without current pathological fracture: Secondary | ICD-10-CM

## 2022-06-23 DIAGNOSIS — Z7985 Long-term (current) use of injectable non-insulin antidiabetic drugs: Secondary | ICD-10-CM

## 2022-06-23 DIAGNOSIS — F17210 Nicotine dependence, cigarettes, uncomplicated: Secondary | ICD-10-CM

## 2022-06-23 DIAGNOSIS — E1169 Type 2 diabetes mellitus with other specified complication: Secondary | ICD-10-CM | POA: Diagnosis not present

## 2022-06-23 DIAGNOSIS — I2510 Atherosclerotic heart disease of native coronary artery without angina pectoris: Secondary | ICD-10-CM

## 2022-06-23 DIAGNOSIS — F32A Depression, unspecified: Secondary | ICD-10-CM

## 2022-06-27 ENCOUNTER — Other Ambulatory Visit: Payer: Self-pay | Admitting: Family Medicine

## 2022-06-27 DIAGNOSIS — E1142 Type 2 diabetes mellitus with diabetic polyneuropathy: Secondary | ICD-10-CM

## 2022-07-02 ENCOUNTER — Ambulatory Visit: Payer: Medicare Other

## 2022-07-02 DIAGNOSIS — I5032 Chronic diastolic (congestive) heart failure: Secondary | ICD-10-CM

## 2022-07-02 LAB — COMPREHENSIVE METABOLIC PANEL
ALT: 18 IU/L (ref 0–32)
AST: 26 IU/L (ref 0–40)
Albumin/Globulin Ratio: 1.6 (ref 1.2–2.2)
Albumin: 4.4 g/dL (ref 3.8–4.8)
Alkaline Phosphatase: 66 IU/L (ref 44–121)
BUN/Creatinine Ratio: 26 (ref 12–28)
BUN: 25 mg/dL (ref 8–27)
Bilirubin Total: 0.2 mg/dL (ref 0.0–1.2)
CO2: 24 mmol/L (ref 20–29)
Calcium: 10.1 mg/dL (ref 8.7–10.3)
Chloride: 99 mmol/L (ref 96–106)
Creatinine, Ser: 0.96 mg/dL (ref 0.57–1.00)
Globulin, Total: 2.7 g/dL (ref 1.5–4.5)
Glucose: 197 mg/dL — ABNORMAL HIGH (ref 70–99)
Potassium: 3.9 mmol/L (ref 3.5–5.2)
Sodium: 141 mmol/L (ref 134–144)
Total Protein: 7.1 g/dL (ref 6.0–8.5)
eGFR: 62 mL/min/{1.73_m2} (ref >59)

## 2022-07-11 ENCOUNTER — Telehealth: Payer: Self-pay

## 2022-07-11 ENCOUNTER — Other Ambulatory Visit: Payer: Self-pay

## 2022-07-11 DIAGNOSIS — I119 Hypertensive heart disease without heart failure: Secondary | ICD-10-CM

## 2022-07-11 MED ORDER — LISINOPRIL-HYDROCHLOROTHIAZIDE 20-25 MG PO TABS: 1.0000 | ORAL_TABLET | Freq: Every day | ORAL | 3 refills | Status: DC

## 2022-07-11 NOTE — Chronic Care Management (AMB) (Signed)
Chronic Care Management Pharmacy Assistant   Name: Sonya Small  MRN: [id number] DOB: February 24, 1951  Reason for Encounter: Medication Review/Medication coordination  Recent office visits:  07-02-2022 Sonya Small, [place]. Glucose= 197.  06-11-2022 [name], [name], MD. Glucose= 315, BUN= 29, BUN/Creatinine Ratio= 37. A1C= 8.5. TSH= 0.336. START potassium chloride 20 meq daily. INCREASE lasix 20 mg daily to 40 mg daily.   03-05-2022 Sonya Booze, MD (Cardiology). LEFT HEART CATH AND CORONARY ANGIOGRAPHY procedure.  Recent consult visits:  None  Hospital visits:  None in previous 6 months  Medications: Outpatient Encounter Medications as of 07/11/2022  Medication Sig Note   albuterol (PROVENTIL) (2.5 MG/3ML) 0.083% nebulizer solution Take 2.5 mg by nebulization every 6 (six) hours as needed for wheezing or shortness of breath.    albuterol (VENTOLIN HFA) 108 (90 Base) MCG/ACT inhaler Inhale 2 puffs into the lungs every 4 (four) hours as needed for wheezing or shortness of breath.    ALPRAZolam (XANAX) 0.5 MG tablet Take 1 tablet (0.5 mg total) by mouth 3 (three) times daily.    Aspirin-Acetaminophen (GOODYS BODY PAIN PO) Take 1 packet by mouth daily.    blood glucose meter kit and supplies KIT Dispense based on patient and insurance preference. Use up to four times daily as directed.    BREO ELLIPTA 100-25 MCG/ACT AEPB Inhale 1 puff into the lungs daily.    clopidogrel (PLAVIX) 75 MG tablet Take 1 tablet (75 mg total) by mouth every morning.    diclofenac Sodium (VOLTAREN) 1 % GEL Apply 4 g topically daily as needed (Hip and leg pain).    Dulaglutide 4.5 MG/0.5ML SOPN Inject 4.5 mg into the skin once a week.    ezetimibe (ZETIA) 10 MG tablet Take 1 tablet (10 mg total) by mouth daily.    fenofibrate 160 MG tablet TAKE ONE CAPSULE BY MOUTH EVERY MORNING    FETZIMA 40 MG CP24 TAKE ONE CAPSULE BY MOUTH ONCE DAILY    fluticasone (FLONASE) 50 MCG/ACT nasal spray INSTILL 1 SPRAY IN  EACH NOSTRIL ONCE DAILY (Patient taking differently: Place 1 spray into both nostrils daily as needed for rhinitis.)    furosemide (LASIX) 40 MG tablet Take 1 tablet (40 mg total) by mouth daily.    gabapentin (NEURONTIN) 300 MG capsule TAKE ONE CAPSULE BY MOUTH EVERYDAY AT BEDTIME    icosapent Ethyl (VASCEPA) 1 g capsule Take 2 capsules (2 g total) by mouth 2 (two) times daily.    insulin degludec (TRESIBA) 200 UNIT/ML FlexTouch Pen Inject 10 Units into the skin daily.    lamoTRIgine (LAMICTAL) 25 MG tablet Take 2 tablets (50 mg total) by mouth at bedtime.    lansoprazole (PREVACID) 30 MG capsule TAKE ONE CAPSULE BY MOUTH BEFORE BREAKFAST    Levomilnacipran HCl ER 20 MG CP24 Take 20 mg by mouth 2 (two) times daily. (Patient not taking: Reported on 06/11/2022)    linaclotide (LINZESS) 145 MCG CAPS capsule Take 1 capsule (145 mcg total) by mouth daily before breakfast.    lisinopril-hydrochlorothiazide (ZESTORETIC) 20-25 MG tablet Take 1 tablet by mouth daily.    methocarbamol (ROBAXIN) 750 MG tablet Take 750 mg by mouth 3 (three) times daily. 04/13/2022: Patient takes 1 tablet at night   montelukast (SINGULAIR) 10 MG tablet Take 10 mg by mouth daily.    mupirocin ointment (BACTROBAN) 2 % Apply 1 application topically 2 (two) times daily. (Patient taking differently: Apply 1 application  topically daily as needed (Rash).)  naloxone (NARCAN) nasal spray 4 mg/0.1 mL SMARTSIG:1 Both Nares Daily    nitroGLYCERIN (NITROSTAT) 0.4 MG SL tablet Place 1 tablet (0.4 mg total) under the tongue every 5 (five) minutes as needed.    ONETOUCH ULTRA test strip DISPENSE BASED ON PATIENT AND INSURANCE PREFERENCE. USE UP TO FOUR TIMES DAILY AS DIRECTED.    oxyCODONE-acetaminophen (PERCOCET) 10-325 MG tablet Take 1 tablet by mouth every 6 (six) hours as needed for pain.    potassium chloride SA (KLOR-CON M) 20 MEQ tablet Take 1 tablet (20 mEq total) by mouth daily.    rosuvastatin (CRESTOR) 20 MG tablet TAKE ONE TABLET  BY MOUTH EVERYDAY AT BEDTIME    tiZANidine (ZANAFLEX) 4 MG tablet Take 2 mg by mouth daily as needed for muscle spasms.    TRUEplus Lancets 30G MISC 1 each by Does not apply route 2 (two) times daily. E11.69    valACYclovir (VALTREX) 1000 MG tablet TAKE TWO TABLETS BY MOUTH twice A DAY as needed    Facility-Administered Encounter Medications as of 07/11/2022  Medication   sodium chloride flush (NS) 0.9 % injection 3 mL  Reviewed chart for medication changes ahead of medication coordination call.  No medication changes indicated   BP Readings from Last 3 Encounters:  04/13/22 (!) 142/76  03/16/22 140/72  03/05/22 (!) 153/76    Lab Results  Component Value Date   HGBA1C 8.5 (H) 06/11/2022     Patient obtains medications through Adherence Packaging  30 Days   Last adherence delivery included:  Montelukast 31m 1 B Lisinopril-HCTZ 20-266m1 B Vascepa 1gm 2 B 2 EM Fetzima ER 4043m B Lansoprazole 75m41mBB Fenofibrate 160mg45m Rosuvastatin 20mg 24m Clopidogrel 75mg 166mamotrigine 25mg 2 78mabapentin 300mg 1 B59m    Furosemide 20mg 1 B 46mient declined (meds) last month: Valtrex 1000mg-Does 26mneed this delivery, only uses prn  Ventolin Inhaler -Pt still has enough Trulicity 3mg-This wa43met to do an acute fill on 06/18/22 Breo Ellipta Inhaler 100-25- Pt has enough on hand    Xanax 0.5 mg 3 times daily - [facility].  Linaclotide 145mcg- Recei18msamples from Office  Tizanidine 0.5 mg 2 times daily- [facility] not [facility]  Flonase Nasal Spray- Plenty of supply only uses as needed  Nitroglycerin 0.4- Has enough. Only uses prn  Mupirocin Ointment 2% - Only uses prn. Last fill 10/19/21 30ds. Only uses prn  Test Strips/Lancets- [facility]. Cannot be billed through her insurance  Diclofenac gel- over supply Methocarbamol 750mg-Pain man10ment D/C this  Patient is due for next adherence delivery on: 07-23-2022  Called patient and  reviewed medications and coordinated delivery.  This delivery to include: Montelukast 10mg 1 B Lisin23ml-HCTZ 20-25mg 1 B Vascep33mm 2 B 2 EM Fetzima ER 40mg 1 B Lansopr61me 75mg 1 BB Fenofib17m 160mg 1 B Rosuvasta75m20mg 1 BT Clopidogr27m5mg 1 B Lamotrigine64mg 2 BT Gabapentin 72mg 1 BT       Furose68m 20mg 1 B Potassium chlo61m er 20 meq 1 B Zetia 10 mg 1 B Valtrex 1000 mg BID PRN (Vials)  No acute/short fill needed  Patient declined the following medications: Ventolin Inhaler -Pt still has enough Breo Ellipta Inhaler 100-25- Pt has enough on hand    Xanax 0.5 mg 3 times daily -  [facility] Linaclotide 145mcg- Not taking Tizani22m 0.5 mg 2 times daily- Does  not need [facility]  Flonase Nasal Spray- Plenty of supply only uses as needed  Nitroglycerin 0.4- Has enough. Only uses prn  Mupirocin Ointment 2% - Only uses prn.  Test Strips/Lancets- [facility]. Cannot be billed through her insurance  Diclofenac gel- over supply Methocarbamol 728m-Pain management D/C this Trulicity- On auto fill and will be delivered 07-11-2022  Patient needs refills for: Refill request sent Lisinopril-HCTZ  Confirmed delivery date of 07-23-2022 advised patient that pharmacy will contact them the morning of delivery.  Care Gaps: Hep c screening Tdap overdue Colonoscopy overdue Mammogram overdue Shingrix overdue PNA Vac overdue Covid booster Yearly foot exam overdue Flu vaccine overdue Yearly ophthalmology overdue  Star Rating Drugs: Lisinopril-HCTZ 20-25 mg- Last filled 06-15-2022 30 DS Rosuvastatin 20 mg- Last filled [id number] DS Trulicity 4.5 mg- Last filled 06-15-2022 28 DS  MBillingsClinical Pharmacist Assistant [phone]

## 2022-07-16 ENCOUNTER — Encounter: Payer: Self-pay | Admitting: Family Medicine

## 2022-07-16 ENCOUNTER — Ambulatory Visit (INDEPENDENT_AMBULATORY_CARE_PROVIDER_SITE_OTHER): Payer: Medicare Other | Admitting: Family Medicine

## 2022-07-16 VITALS — BP 124/68 | HR 102 | Temp 97.5°F | Ht 65.0 in | Wt 151.8 lb

## 2022-07-16 DIAGNOSIS — J441 Chronic obstructive pulmonary disease with (acute) exacerbation: Secondary | ICD-10-CM | POA: Diagnosis not present

## 2022-07-16 DIAGNOSIS — J189 Pneumonia, unspecified organism: Secondary | ICD-10-CM

## 2022-07-16 DIAGNOSIS — B370 Candidal stomatitis: Secondary | ICD-10-CM

## 2022-07-16 DIAGNOSIS — R051 Acute cough: Secondary | ICD-10-CM

## 2022-07-16 LAB — POC COVID19 BINAXNOW: SARS Coronavirus 2 Ag: NEGATIVE

## 2022-07-16 MED ORDER — AZITHROMYCIN 250 MG PO TABS: ORAL_TABLET | ORAL | 0 refills | Status: DC

## 2022-07-16 MED ORDER — TRIAMCINOLONE ACETONIDE 40 MG/ML IJ SUSP
80.0000 mg | Freq: Once | INTRAMUSCULAR | Status: AC
  Administered 2022-07-16: 80 mg via INTRAMUSCULAR

## 2022-07-16 MED ORDER — CLOTRIMAZOLE 10 MG MT TROC: 10.0000 mg | Freq: Every day | OROMUCOSAL | 0 refills | Status: DC

## 2022-07-16 MED ORDER — CEFDINIR 300 MG PO CAPS: 300.0000 mg | ORAL_CAPSULE | Freq: Two times a day (BID) | ORAL | 0 refills | Status: DC

## 2022-07-16 MED ORDER — CEFTRIAXONE SODIUM 1 G IJ SOLR
1.0000 g | Freq: Once | INTRAMUSCULAR | Status: AC
  Administered 2022-07-16: 1 g via INTRAMUSCULAR

## 2022-07-16 NOTE — Patient Instructions (Signed)
Antibiotics sent: Cefdinir and azithromycin. Rocephin and Kenalog shot given. Go to [facility] for chest x-ray.

## 2022-07-16 NOTE — Telephone Encounter (Signed)
Compliant on meds 

## 2022-07-16 NOTE — Progress Notes (Signed)
Acute Office Visit  Subjective:    Patient ID: Sonya Small, female    DOB: 1951-09-06, 71 y.o.   MRN: [id number]  Chief Complaint  Patient presents with   Cough    Nasal congestion/shortness of breath/fatigued/no appetite     Patient is in today for cough, nasal congestion, chest congestion, wheezing x 9 days. Complaining of fatigue, runny nose, shortness of breath. Productive green sputum.  Using breo one inhalation daily. Albuterol HFA 2 puffs twice daily.  Taking robitussin and alka seltzer.  Past Medical History:  Diagnosis Date   Anxiety    CAD (coronary artery disease)    a. NSTEMI 11/2008 s/p DES to LCx (3.0x12 Xience); b. NSTEMI 01/2010 secondary to thrombotic RCA lesion (non-obstructive)-->med rx (integrilin x 24 hrs + plavix); c. 09/2012 negative Myoview.   Chronic diastolic CHF (congestive heart failure) ([place])    a. 06/2014 Echo: EF 55-60%, no rwma, Gr1 DD, mild AI.   Depression    Dizziness    Drug induced constipation    Ganglion cyst of left foot 01/17/2021   Generalized hyperhidrosis    GERD (gastroesophageal reflux disease)    Headache    Hyperlipidemia    Hypertensive heart disease    Lumbar disc disease    Metabolic encephalopathy    Mixed hyperlipidemia    Myocardial infarction ([place])    Obstructive sleep apnea    OP (osteoporosis)    Osteoarthritis    Osteoporosis    Other malaise    Overweight(278.02)    PAD (peripheral artery disease) ([place])    a. Emboli to R foot 2010 from partially occlusive lesion in R EIA, s/p stenting. - followed by Dr. Donnetta Hutching;  b. 10/2015 ABIs: R 1.03, L 0.97.   Restless leg    Sleep apnea    Stroke [facility])    TIA (transient ischemic attack)    Tobacco abuse    Urge incontinence     Past Surgical History:  Procedure Laterality Date   CYST EXCISION Left 01/2021   left foot   EYE SURGERY     at age 53   hysterectomy -age 71     ILIAC ARTERY STENT     RIGHT ILIAC STENT   KNEE ARTHROSCOPY     LEFT HEART CATH AND CORONARY  ANGIOGRAPHY N/A 03/05/2022   Procedure: LEFT HEART CATH AND CORONARY ANGIOGRAPHY;  Surgeon: [name], MD;  Location: [place] [facility];  Service: Cardiovascular;  Laterality: N/A;   LITHOTRIPSY Left 12/2020   LUMBAR LAMINECTOMY     TUBAL LIGATION      Family History  Problem Relation Age of Onset   Alzheimer's disease Mother    Hodgkin's lymphoma Brother    Lung cancer Brother    Hepatitis C Brother    Hypothyroidism Brother    Hypertension Brother    Depression Brother    Heart disease Other        Grandfather    Social History   Socioeconomic History   Marital status: Divorced    Spouse name: Not on file   Number of children: 3   Years of education: 10 th   Highest education level: Not on file  Occupational History   Occupation: DISABLED    Employer: UNEMPLOYED  Tobacco Use   Smoking status: Every Day    Packs/day: 0.50    Years: 54.00    Total pack years: 27.00    Types: Cigarettes    Last attempt to quit: 11/20/2012  Years since quitting: 9.6   Smokeless tobacco: Never   Tobacco comments:    2 ppd for many years.     04/13/2022 Patient smokes 1/2 pack daily or more if she is nervous  Substance and Sexual Activity   Alcohol use: No    Alcohol/week: 0.0 standard drinks of alcohol   Drug use: No   Sexual activity: Never  Other Topics Concern   Not on file  Social History Narrative   Patient is single with 3 children, 1 deceased.   Patient is right handed.   Patient has 10 th grade education.   Patient drinks 5 or more cups daily.   Social Determinants of Health   Financial Resource Strain: High Risk (06/07/2022)   Overall Financial Resource Strain (CARDIA)    Difficulty of Paying Living Expenses: Hard  Food Insecurity: No Food Insecurity (08/10/2020)   Hunger Vital Sign    Worried About Running Out of Food in the Last Year: Never true    Ran Out of Food in the Last Year: Never true  Transportation Needs: No Transportation Needs (06/07/2022)    PRAPARE - Hydrologist (Medical): No    Lack of Transportation (Non-Medical): No  Physical Activity: Not on file  Stress: Stress Concern Present (01/19/2020)   [place]    Feeling of Stress : Rather much  Social Connections: Not on file  Intimate Partner Violence: Not on file    Outpatient Medications Prior to Visit  Medication Sig Dispense Refill   albuterol (PROVENTIL) (2.5 MG/3ML) 0.083% nebulizer solution Take 2.5 mg by nebulization every 6 (six) hours as needed for wheezing or shortness of breath.     albuterol (VENTOLIN HFA) 108 (90 Base) MCG/ACT inhaler Inhale 2 puffs into the lungs every 4 (four) hours as needed for wheezing or shortness of breath.     ALPRAZolam (XANAX) 0.5 MG tablet Take 1 tablet (0.5 mg total) by mouth 3 (three) times daily. 90 tablet 2   Aspirin-Acetaminophen (GOODYS BODY PAIN PO) Take 1 packet by mouth daily.     baclofen (LIORESAL) 10 MG tablet Take 10 mg by mouth 3 (three) times daily as needed.     blood glucose meter kit and supplies KIT Dispense based on patient and insurance preference. Use up to four times daily as directed. 1 each 0   BREO ELLIPTA 100-25 MCG/ACT AEPB Inhale 1 puff into the lungs daily.     clopidogrel (PLAVIX) 75 MG tablet Take 1 tablet (75 mg total) by mouth every morning. 90 tablet 2   diclofenac Sodium (VOLTAREN) 1 % GEL Apply 4 g topically daily as needed (Hip and leg pain). 100 g 1   Dulaglutide 4.5 MG/0.5ML SOPN Inject 4.5 mg into the skin once a week. 2 mL 2   ezetimibe (ZETIA) 10 MG tablet Take 1 tablet (10 mg total) by mouth daily. 90 tablet 1   fenofibrate 160 MG tablet TAKE ONE CAPSULE BY MOUTH EVERY MORNING 90 tablet 2   FETZIMA 40 MG CP24 TAKE ONE CAPSULE BY MOUTH ONCE DAILY 30 capsule 2   fluticasone (FLONASE) 50 MCG/ACT nasal spray INSTILL 1 SPRAY IN EACH NOSTRIL ONCE DAILY (Patient taking differently: Place 1 spray into both  nostrils daily as needed for rhinitis.) 16 g 1   furosemide (LASIX) 40 MG tablet Take 1 tablet (40 mg total) by mouth daily. 90 tablet 0   gabapentin (NEURONTIN) 300 MG capsule TAKE ONE  CAPSULE BY MOUTH EVERYDAY AT BEDTIME 30 capsule 2   icosapent Ethyl (VASCEPA) 1 g capsule Take 2 capsules (2 g total) by mouth 2 (two) times daily. 360 capsule 1   insulin degludec (TRESIBA) 200 UNIT/ML FlexTouch Pen Inject 10 Units into the skin daily. 3 mL ML   lamoTRIgine (LAMICTAL) 25 MG tablet Take 2 tablets (50 mg total) by mouth at bedtime. 180 tablet 1   lansoprazole (PREVACID) 30 MG capsule TAKE ONE CAPSULE BY MOUTH BEFORE BREAKFAST 90 capsule 3   Levomilnacipran HCl ER 20 MG CP24 Take 20 mg by mouth 2 (two) times daily. 60 capsule 2   linaclotide (LINZESS) 145 MCG CAPS capsule Take 1 capsule (145 mcg total) by mouth daily before breakfast. 12 capsule 0   lisinopril-hydrochlorothiazide (ZESTORETIC) 20-25 MG tablet Take 1 tablet by mouth daily. 30 tablet 3   methocarbamol (ROBAXIN) 750 MG tablet Take 750 mg by mouth 3 (three) times daily.     montelukast (SINGULAIR) 10 MG tablet Take 10 mg by mouth daily.     mupirocin ointment (BACTROBAN) 2 % Apply 1 application topically 2 (two) times daily. (Patient taking differently: Apply 1 application  topically daily as needed (Rash).) 30 g 0   naloxone (NARCAN) nasal spray 4 mg/0.1 mL SMARTSIG:1 Both Nares Daily     nitroGLYCERIN (NITROSTAT) 0.4 MG SL tablet Place 1 tablet (0.4 mg total) under the tongue every 5 (five) minutes as needed. 25 tablet 1   ONETOUCH ULTRA test strip DISPENSE BASED ON PATIENT AND INSURANCE PREFERENCE. USE UP TO FOUR TIMES DAILY AS DIRECTED. 100 strip 3   oxyCODONE-acetaminophen (PERCOCET) 10-325 MG tablet Take 1 tablet by mouth every 6 (six) hours as needed for pain.     potassium chloride SA (KLOR-CON M) 20 MEQ tablet Take 1 tablet (20 mEq total) by mouth daily. 90 tablet 0   rosuvastatin (CRESTOR) 20 MG tablet TAKE ONE TABLET BY MOUTH  EVERYDAY AT BEDTIME 90 tablet 2   tiZANidine (ZANAFLEX) 4 MG tablet Take 2 mg by mouth daily as needed for muscle spasms.     TRUEplus Lancets 30G MISC 1 each by Does not apply route 2 (two) times daily. E11.69 100 each 3   valACYclovir (VALTREX) 1000 MG tablet TAKE TWO TABLETS BY MOUTH twice A DAY as needed 20 tablet 2   Facility-Administered Medications Prior to Visit  Medication Dose Route Frequency Provider Last Rate Last Admin   sodium chloride flush (NS) 0.9 % injection 3 mL  3 mL Intravenous Q12H [name], [name], MD        Allergies  Allergen Reactions   Abilify [Aripiprazole]     confusion   Latex Rash    Review of Systems  Constitutional:  Positive for appetite change (doesn't want to eat much) and fatigue. Negative for fever.  HENT:  Positive for congestion and rhinorrhea. Negative for ear pain, sinus pressure and sore throat.   Respiratory:  Positive for cough and shortness of breath. Negative for chest tightness and wheezing.   Cardiovascular:  Negative for chest pain and palpitations.  Gastrointestinal:  Negative for abdominal pain, constipation, diarrhea, nausea and vomiting.  Genitourinary:  Negative for dysuria and hematuria.  Musculoskeletal:  Negative for arthralgias, back pain, joint swelling and myalgias.  Skin:  Negative for rash.  Neurological:  Negative for dizziness, weakness and headaches.  Psychiatric/Behavioral:  Negative for dysphoric mood. The patient is not nervous/anxious.        Objective:    Physical Exam Vitals reviewed.  Constitutional:      Appearance: Normal appearance. She is normal weight.  HENT:     Right Ear: Tympanic membrane normal.     Left Ear: Tympanic membrane normal.     Nose: Congestion present.     Mouth/Throat:     Pharynx: No oropharyngeal exudate or posterior oropharyngeal erythema.     Comments: Thrush on tongue Cardiovascular:     Rate and Rhythm: Normal rate and regular rhythm.     Heart sounds: Normal heart sounds.   Pulmonary:     Effort: Pulmonary effort is normal.     Breath sounds: Normal breath sounds.  Abdominal:     General: Abdomen is flat. Bowel sounds are normal.     Palpations: Abdomen is soft.  Neurological:     Mental Status: She is alert and oriented to person, place, and time.  Psychiatric:        Mood and Affect: Mood normal.        Behavior: Behavior normal.     BP 124/68 (BP Location: Right Arm, Patient Position: Sitting)   Pulse (!) 102   Temp (!) 97.5 F (36.4 C) (Temporal)   Ht _0  (1.651 m)   Wt 151 lb 12.8 oz (68.9 kg)   SpO2 95%   BMI 25.26 kg/m  Wt Readings from Last 3 Encounters:  07/16/22 151 lb 12.8 oz (68.9 kg)  04/13/22 159 lb 6.4 oz (72.3 kg)  03/16/22 163 lb (73.9 kg)    Health Maintenance Due  Topic Date Due   Medicare Annual Wellness (AWV)  Never done   Hepatitis C Screening  Never done   TETANUS/TDAP  Never done   COLONOSCOPY (Pts 45-77yr Insurance coverage will need to be confirmed)  Never done   Lung Cancer Screening  Never done   MAMMOGRAM  Never done   Zoster Vaccines- Shingrix (1 of 2) Never done   Pneumonia Vaccine 73 Years old (3 - PPSV23 or PCV20) 06/25/2016   COVID-19 Vaccine (2 - Moderna series) 09/26/2020   FOOT EXAM  11/25/2021   INFLUENZA VACCINE  04/24/2022   OPHTHALMOLOGY EXAM  06/27/2022    There are no preventive care reminders to display for this patient.   Lab Results  Component Value Date   TSH 0.336 (L) 06/11/2022   Lab Results  Component Value Date   WBC 8.5 06/11/2022   HGB 13.8 06/11/2022   HCT 43.1 06/11/2022   MCV 91 06/11/2022   PLT 261 06/11/2022   Lab Results  Component Value Date   NA 141 07/02/2022   K 3.9 07/02/2022   CO2 24 07/02/2022   GLUCOSE 197 (H) 07/02/2022   BUN 25 07/02/2022   CREATININE 0.96 07/02/2022   BILITOT 0.2 07/02/2022   ALKPHOS 66 07/02/2022   AST 26 07/02/2022   ALT 18 07/02/2022   PROT 7.1 07/02/2022   ALBUMIN 4.4 07/02/2022   CALCIUM 10.1 07/02/2022   ANIONGAP  10 10/19/2020   EGFR 62 07/02/2022   Lab Results  Component Value Date   CHOL 147 03/01/2022   Lab Results  Component Value Date   HDL 41 03/01/2022   Lab Results  Component Value Date   LDLCALC 82 03/01/2022   Lab Results  Component Value Date   TRIG 134 03/01/2022   Lab Results  Component Value Date   CHOLHDL 3.6 03/01/2022   Lab Results  Component Value Date   HGBA1C 8.5 (H) 06/11/2022         Assessment &  Plan:   Problem List Items Addressed This Visit   None Visit Diagnoses     Acute cough    -  Primary   Relevant Orders   POC COVID-19 BinaxNow (Completed)   DG Chest 2 View   Pneumonia of right lower lobe due to infectious organism       Relevant Medications   cefTRIAXone (ROCEPHIN) injection 1 g (Completed)   cefdinir (OMNICEF) 300 MG capsule   azithromycin (ZITHROMAX) 250 MG tablet   clotrimazole (MYCELEX) 10 MG troche   Other Relevant Orders   DG Chest 2 View   COPD exacerbation (HCC)       Relevant Medications   triamcinolone acetonide (KENALOG-40) injection 80 mg (Completed)   azithromycin (ZITHROMAX) 250 MG tablet   Thrush       Relevant Medications   cefTRIAXone (ROCEPHIN) injection 1 g (Completed)   cefdinir (OMNICEF) 300 MG capsule   azithromycin (ZITHROMAX) 250 MG tablet   clotrimazole (MYCELEX) 10 MG troche        Meds ordered this encounter  Medications   triamcinolone acetonide (KENALOG-40) injection 80 mg   cefTRIAXone (ROCEPHIN) injection 1 g   cefdinir (OMNICEF) 300 MG capsule    Sig: Take 1 capsule (300 mg total) by mouth 2 (two) times daily.    Dispense:  20 capsule    Refill:  0   azithromycin (ZITHROMAX) 250 MG tablet    Sig: 2 DAILY FOR FIRST DAY, THEN DECREASE TO ONE DAILY FOR 4 MORE DAYS.    Dispense:  6 tablet    Refill:  0   clotrimazole (MYCELEX) 10 MG troche    Sig: Take 1 tablet (10 mg total) by mouth 5 (five) times daily.    Dispense:  35 Troche    Refill:  0    I,Lauren M Auman,acting as a scribe for  [name], MD.,have documented all relevant documentation on the behalf of [name], MD,as directed by  [name], MD while in the presence of [name], MD.   [name], MD

## 2022-07-17 ENCOUNTER — Other Ambulatory Visit: Payer: Self-pay

## 2022-07-17 DIAGNOSIS — R5383 Other fatigue: Secondary | ICD-10-CM

## 2022-07-18 ENCOUNTER — Other Ambulatory Visit: Payer: Self-pay

## 2022-07-18 DIAGNOSIS — J189 Pneumonia, unspecified organism: Secondary | ICD-10-CM

## 2022-07-18 DIAGNOSIS — R5383 Other fatigue: Secondary | ICD-10-CM

## 2022-07-18 DIAGNOSIS — R051 Acute cough: Secondary | ICD-10-CM

## 2022-07-18 MED ORDER — ALPRAZOLAM 0.5 MG PO TABS: 0.5000 mg | ORAL_TABLET | Freq: Three times a day (TID) | ORAL | 2 refills | Status: DC

## 2022-08-08 ENCOUNTER — Telehealth: Payer: Self-pay

## 2022-08-08 ENCOUNTER — Other Ambulatory Visit: Payer: Self-pay

## 2022-08-08 DIAGNOSIS — I119 Hypertensive heart disease without heart failure: Secondary | ICD-10-CM

## 2022-08-08 MED ORDER — LAMOTRIGINE 25 MG PO TABS: 50.0000 mg | ORAL_TABLET | Freq: Every day | ORAL | 1 refills | Status: DC

## 2022-08-08 MED ORDER — FETZIMA 40 MG PO CP24: 1.0000 | ORAL_CAPSULE | Freq: Every day | ORAL | 1 refills | Status: DC

## 2022-08-08 MED ORDER — GABAPENTIN 300 MG PO CAPS: ORAL_CAPSULE | ORAL | 1 refills | Status: DC

## 2022-08-08 MED ORDER — POTASSIUM CHLORIDE CRYS ER 20 MEQ PO TBCR: 20.0000 meq | EXTENDED_RELEASE_TABLET | Freq: Every day | ORAL | 1 refills | Status: DC

## 2022-08-08 NOTE — Progress Notes (Signed)
Chronic Care Management Pharmacy Assistant   Name: Sonya Small  MRN: [id number] DOB: [birth year]   Reason for Encounter: Medication Coordination for [facility]    Recent office visits:  07/16/22 Sonya Brome MD. Seen for cough. Started on Azithromycin, Cefdinir and Clotrimazole.   Recent consult visits:  None  Hospital visits:  None  Medications: Outpatient Encounter Medications as of 08/08/2022  Medication Sig Note   albuterol (PROVENTIL) (2.5 MG/3ML) 0.083% nebulizer solution Take 2.5 mg by nebulization every 6 (six) hours as needed for wheezing or shortness of breath.    albuterol (VENTOLIN HFA) 108 (90 Base) MCG/ACT inhaler Inhale 2 puffs into the lungs every 4 (four) hours as needed for wheezing or shortness of breath.    ALPRAZolam (XANAX) 0.5 MG tablet Take 1 tablet (0.5 mg total) by mouth 3 (three) times daily.    Aspirin-Acetaminophen (GOODYS BODY PAIN PO) Take 1 packet by mouth daily.    azithromycin (ZITHROMAX) 250 MG tablet 2 DAILY FOR FIRST DAY, THEN DECREASE TO ONE DAILY FOR 4 MORE DAYS.    baclofen (LIORESAL) 10 MG tablet Take 10 mg by mouth 3 (three) times daily as needed.    blood glucose meter kit and supplies KIT Dispense based on patient and insurance preference. Use up to four times daily as directed.    BREO ELLIPTA 100-25 MCG/ACT AEPB Inhale 1 puff into the lungs daily.    cefdinir (OMNICEF) 300 MG capsule Take 1 capsule (300 mg total) by mouth 2 (two) times daily.    clopidogrel (PLAVIX) 75 MG tablet Take 1 tablet (75 mg total) by mouth every morning.    clotrimazole (MYCELEX) 10 MG troche Take 1 tablet (10 mg total) by mouth 5 (five) times daily.    diclofenac Sodium (VOLTAREN) 1 % GEL Apply 4 g topically daily as needed (Hip and leg pain).    Dulaglutide 4.5 MG/0.5ML SOPN Inject 4.5 mg into the skin once a week.    ezetimibe (ZETIA) 10 MG tablet Take 1 tablet (10 mg total) by mouth daily.    fenofibrate 160 MG tablet TAKE ONE CAPSULE BY MOUTH EVERY  MORNING    FETZIMA 40 MG CP24 TAKE ONE CAPSULE BY MOUTH ONCE DAILY    fluticasone (FLONASE) 50 MCG/ACT nasal spray INSTILL 1 SPRAY IN EACH NOSTRIL ONCE DAILY (Patient taking differently: Place 1 spray into both nostrils daily as needed for rhinitis.)    furosemide (LASIX) 40 MG tablet Take 1 tablet (40 mg total) by mouth daily.    gabapentin (NEURONTIN) 300 MG capsule TAKE ONE CAPSULE BY MOUTH EVERYDAY AT BEDTIME    icosapent Ethyl (VASCEPA) 1 g capsule Take 2 capsules (2 g total) by mouth 2 (two) times daily.    insulin degludec (TRESIBA) 200 UNIT/ML FlexTouch Pen Inject 10 Units into the skin daily.    lamoTRIgine (LAMICTAL) 25 MG tablet Take 2 tablets (50 mg total) by mouth at bedtime.    lansoprazole (PREVACID) 30 MG capsule TAKE ONE CAPSULE BY MOUTH BEFORE BREAKFAST    Levomilnacipran HCl ER 20 MG CP24 Take 20 mg by mouth 2 (two) times daily.    linaclotide (LINZESS) 145 MCG CAPS capsule Take 1 capsule (145 mcg total) by mouth daily before breakfast.    lisinopril-hydrochlorothiazide (ZESTORETIC) 20-25 MG tablet Take 1 tablet by mouth daily.    methocarbamol (ROBAXIN) 750 MG tablet Take 750 mg by mouth 3 (three) times daily. 04/13/2022: Patient takes 1 tablet at night   montelukast (SINGULAIR) 10 MG tablet Take  10 mg by mouth daily.    mupirocin ointment (BACTROBAN) 2 % Apply 1 application topically 2 (two) times daily. (Patient taking differently: Apply 1 application  topically daily as needed (Rash).)    naloxone (NARCAN) nasal spray 4 mg/0.1 mL SMARTSIG:1 Both Nares Daily    nitroGLYCERIN (NITROSTAT) 0.4 MG SL tablet Place 1 tablet (0.4 mg total) under the tongue every 5 (five) minutes as needed.    ONETOUCH ULTRA test strip DISPENSE BASED ON PATIENT AND INSURANCE PREFERENCE. USE UP TO FOUR TIMES DAILY AS DIRECTED.    oxyCODONE-acetaminophen (PERCOCET) 10-325 MG tablet Take 1 tablet by mouth every 6 (six) hours as needed for pain.    potassium chloride SA (KLOR-CON M) 20 MEQ tablet Take 1  tablet (20 mEq total) by mouth daily.    rosuvastatin (CRESTOR) 20 MG tablet TAKE ONE TABLET BY MOUTH EVERYDAY AT BEDTIME    tiZANidine (ZANAFLEX) 4 MG tablet Take 2 mg by mouth daily as needed for muscle spasms.    TRUEplus Lancets 30G MISC 1 each by Does not apply route 2 (two) times daily. E11.69    valACYclovir (VALTREX) 1000 MG tablet TAKE TWO TABLETS BY MOUTH twice A DAY as needed    Facility-Administered Encounter Medications as of 08/08/2022  Medication   sodium chloride flush (NS) 0.9 % injection 3 mL   Reviewed chart for medication changes ahead of medication coordination call.  No Consults, or hospital visits since last care coordination call/Pharmacist visit.   No medication changes indicated OR if recent visit, treatment plan here.  BP Readings from Last 3 Encounters:  07/16/22 124/68  04/13/22 (!) 142/76  03/16/22 140/72    Lab Results  Component Value Date   HGBA1C 8.5 (H) 06/11/2022     Patient obtains medications through Adherence Packaging  30 Days   Last adherence delivery included: Montelukast 10mg 1 B Lisinopril-HCTZ 20-25mg 1 B Vascepa 1gm 2 B 2 EM Fetzima ER 40mg 1 B Lansoprazole 30mg 1 BB Fenofibrate 160mg 1 B Rosuvastatin 20mg 1 BT Clopidogrel 75mg 1 B Lamotrigine 25mg 2 BT Gabapentin 300mg 1 BT       Furosemide 20mg 1 B Potassium chloride er 20 meq 1 B Zetia 10 mg 1 B Valtrex 1000 mg BID PRN (Vials)  Patient declined (meds) last month  Ventolin Inhaler -Pt still has enough Breo Ellipta Inhaler 100-25- Pt has enough on hand    Xanax 0.5 mg 3 times daily -  [facility] Linaclotide 145mcg- Not taking Tizanidine 0.5 mg 2 times daily- Does not need gets at [facility]  Flonase Nasal Spray- Plenty of supply only uses as needed  Nitroglycerin 0.4- Has enough. Only uses prn  Mupirocin Ointment 2% - Only uses prn.  Test Strips/Lancets- Gets from [facility]. Cannot be billed through her insurance  Diclofenac gel- over supply Methocarbamol 750mg-Pain management  D/C this Trulicity- On auto fill and will be delivered 07-11-2022  Patient is due for next adherence delivery on: 1128/23. Called patient and reviewed medications and coordinated delivery.  This delivery to include: Montelukast 10mg 1 B Lisinopril-HCTZ 20-25mg 1 B Vascepa 1gm 2 B 2 EM Ezetimibe 10mg 1 B Fetzima ER 40mg 1 B Lansoprazole 30mg 1 BB Fenofibrate 160mg 1 B Rosuvastatin 20mg 1 BT Clopidogrel 75mg 1 B Lamotrigine 25mg 2 BT Gabapentin 300mg 1 BT       Furosemide 20mg 1 B Potassium chloride er 20 meq 1 B Xanax 0.5mg- 1 three times daily (Bottles)   Patient declined the following medications    Ventolin Inhaler -Pt still has enough Breo Ellipta Inhaler 100-25- Pt has enough on hand    Linaclotide 145mcg- Not taking Tizanidine 0.5 mg 2 times daily- [facility] not [facility]  Flonase Nasal Spray- Plenty of supply only uses as needed  Nitroglycerin 0.4- Has enough. Only uses prn  Mupirocin Ointment 2% - Only uses prn.  Test Strips/Lancets- [facility]. Cannot be billed through her insurance  Diclofenac gel- over supply Methocarbamol 750mg-Pain management D/C this Trulicity-Delivered on 08/06/22  Patient needs refills-Refill Request Sent  Fetzima ER 40mg  Lamotrigine 25mg Gabapentin 300mg Potassium chloride er 20 meq   Confirmed delivery date of 08/21/22, advised patient that pharmacy will contact them the morning of delivery.   Danielle Gerringer, CMA Clinical Pharmacist Assistant  [phone]  

## 2022-08-14 ENCOUNTER — Telehealth: Payer: Self-pay

## 2022-08-14 NOTE — Progress Notes (Signed)
08-14-2022: Patient wanted to inform [name] that she will be switching pharmacies due to insurance change in January. Sent message to [place] informing her.  [place] Pharmacist Assistant [phone]

## 2022-08-24 ENCOUNTER — Other Ambulatory Visit: Payer: Self-pay | Admitting: Family Medicine

## 2022-08-28 ENCOUNTER — Ambulatory Visit (INDEPENDENT_AMBULATORY_CARE_PROVIDER_SITE_OTHER): Payer: Medicare Other | Admitting: Family Medicine

## 2022-08-28 VITALS — BP 116/60 | HR 74 | Temp 97.1°F | Resp 18 | Ht 65.0 in | Wt 153.0 lb

## 2022-08-28 DIAGNOSIS — N39498 Other specified urinary incontinence: Secondary | ICD-10-CM | POA: Diagnosis not present

## 2022-08-28 DIAGNOSIS — G8929 Other chronic pain: Secondary | ICD-10-CM | POA: Diagnosis not present

## 2022-08-28 DIAGNOSIS — N3000 Acute cystitis without hematuria: Secondary | ICD-10-CM | POA: Diagnosis not present

## 2022-08-28 DIAGNOSIS — M25562 Pain in left knee: Secondary | ICD-10-CM | POA: Diagnosis not present

## 2022-08-28 LAB — POCT URINALYSIS DIPSTICK
Bilirubin, UA: NEGATIVE
Blood, UA: NEGATIVE
Glucose, UA: NEGATIVE
Ketones, UA: NEGATIVE
Nitrite, UA: NEGATIVE
Protein, UA: NEGATIVE
Spec Grav, UA: 1.025 (ref 1.010–1.025)
Urobilinogen, UA: 0.2 E.U./dL
pH, UA: 5 (ref 5.0–8.0)

## 2022-08-28 NOTE — Progress Notes (Signed)
Subjective:  Patient ID: Sonya Small, female    DOB: September 23, 1951  Age: 71 y.o. MRN: [id number]  Chief Complaint  Patient presents with   Edema   Dysuria   myalgias    Patient comes in with worsening urinary incontinence.  She has had burning with urination, urinary urgency, and frequency.  She also has been experiencing severe cramping pain in her legs.    Patient has been wearing depends for months.    Patient is having severe pain in left knee. Feels like something is pulling around her left knee. Patient sees pain clinic.    Current Outpatient Medications on File Prior to Visit  Medication Sig Dispense Refill   albuterol (PROVENTIL) (2.5 MG/3ML) 0.083% nebulizer solution Take 2.5 mg by nebulization every 6 (six) hours as needed for wheezing or shortness of breath.     albuterol (VENTOLIN HFA) 108 (90 Base) MCG/ACT inhaler Inhale 2 puffs into the lungs every 4 (four) hours as needed for wheezing or shortness of breath.     ALPRAZolam (XANAX) 0.5 MG tablet Take 1 tablet (0.5 mg total) by mouth 3 (three) times daily. 90 tablet 2   Aspirin-Acetaminophen (GOODYS BODY PAIN PO) Take 1 packet by mouth daily.     blood glucose meter kit and supplies KIT Dispense based on patient and insurance preference. Use up to four times daily as directed. 1 each 0   BREO ELLIPTA 100-25 MCG/ACT AEPB Inhale 1 puff into the lungs daily.     clopidogrel (PLAVIX) 75 MG tablet Take 1 tablet (75 mg total) by mouth every morning. 90 tablet 2   clotrimazole (MYCELEX) 10 MG troche Take 1 tablet (10 mg total) by mouth 5 (five) times daily. 35 Troche 0   diclofenac Sodium (VOLTAREN) 1 % GEL Apply 4 g topically daily as needed (Hip and leg pain). 100 g 1   ezetimibe (ZETIA) 10 MG tablet Take 1 tablet (10 mg total) by mouth daily. 90 tablet 1   fenofibrate 160 MG tablet TAKE ONE CAPSULE BY MOUTH EVERY MORNING 90 tablet 2   fluticasone (FLONASE) 50 MCG/ACT nasal spray INSTILL 1 SPRAY IN EACH NOSTRIL ONCE DAILY  (Patient taking differently: Place 1 spray into both nostrils daily as needed for rhinitis.) 16 g 1   furosemide (LASIX) 40 MG tablet Take 1 tablet (40 mg total) by mouth daily. 90 tablet 0   gabapentin (NEURONTIN) 300 MG capsule TAKE ONE CAPSULE BY MOUTH EVERYDAY AT BEDTIME 90 capsule 1   icosapent Ethyl (VASCEPA) 1 g capsule Take 2 capsules (2 g total) by mouth 2 (two) times daily. 360 capsule 1   insulin degludec (TRESIBA) 200 UNIT/ML FlexTouch Pen Inject 10 Units into the skin daily. 3 mL ML   lamoTRIgine (LAMICTAL) 25 MG tablet Take 2 tablets (50 mg total) by mouth at bedtime. 180 tablet 1   lansoprazole (PREVACID) 30 MG capsule TAKE ONE CAPSULE BY MOUTH BEFORE BREAKFAST 90 capsule 3   Levomilnacipran HCl ER (FETZIMA) 40 MG CP24 Take 1 capsule by mouth daily. 90 capsule 1   Levomilnacipran HCl ER 20 MG CP24 Take 20 mg by mouth 2 (two) times daily. 60 capsule 2   linaclotide (LINZESS) 145 MCG CAPS capsule Take 1 capsule (145 mcg total) by mouth daily before breakfast. 12 capsule 0   lisinopril-hydrochlorothiazide (ZESTORETIC) 20-25 MG tablet Take 1 tablet by mouth daily. 30 tablet 3   methocarbamol (ROBAXIN) 750 MG tablet Take 750 mg by mouth 3 (three) times daily.  montelukast (SINGULAIR) 10 MG tablet Take 10 mg by mouth daily.     mupirocin ointment (BACTROBAN) 2 % Apply 1 application topically 2 (two) times daily. (Patient taking differently: Apply 1 application  topically daily as needed (Rash).) 30 g 0   naloxone (NARCAN) nasal spray 4 mg/0.1 mL SMARTSIG:1 Both Nares Daily     nitroGLYCERIN (NITROSTAT) 0.4 MG SL tablet Place 1 tablet (0.4 mg total) under the tongue every 5 (five) minutes as needed. 25 tablet 1   ONETOUCH ULTRA test strip DISPENSE BASED ON PATIENT AND INSURANCE PREFERENCE. USE UP TO FOUR TIMES DAILY AS DIRECTED. 100 strip 3   oxyCODONE-acetaminophen (PERCOCET) 10-325 MG tablet Take 1 tablet by mouth every 6 (six) hours as needed for pain.     potassium chloride SA  (KLOR-CON M) 20 MEQ tablet Take 1 tablet (20 mEq total) by mouth daily. 90 tablet 1   rosuvastatin (CRESTOR) 20 MG tablet TAKE ONE TABLET BY MOUTH EVERYDAY AT BEDTIME 90 tablet 2   TRUEplus Lancets 30G MISC 1 each by Does not apply route 2 (two) times daily. E11.69 093 each 3   TRULICITY 4.5 GH/8.2XH SOPN Inject 4.74m into THE SKIN ONCE WEEKLY 2 mL 2   valACYclovir (VALTREX) 1000 MG tablet TAKE TWO TABLETS BY MOUTH twice A DAY as needed 20 tablet 2   baclofen (LIORESAL) 10 MG tablet Take 10 mg by mouth 3 (three) times daily as needed. (Patient not taking: Reported on 08/28/2022)     Current Facility-Administered Medications on File Prior to Visit  Medication Dose Route Frequency Provider Last Rate Last Admin   sodium chloride flush (NS) 0.9 % injection 3 mL  3 mL Intravenous Q12H JMartinique [name], MD       Past Medical History:  Diagnosis Date   Anxiety    CAD (coronary artery disease)    a. NSTEMI 11/2008 s/p DES to LCx (3.0x12 Xience); b. NSTEMI 01/2010 secondary to thrombotic RCA lesion (non-obstructive)-->med rx (integrilin x 24 hrs + plavix); c. 09/2012 negative Myoview.   Chronic diastolic CHF (congestive heart failure) ([facility]    a. 06/2014 Echo: EF 55-60%, no rwma, Gr1 DD, mild AI.   Depression    Dizziness    Drug induced constipation    Ganglion cyst of left foot 01/17/2021   Generalized hyperhidrosis    GERD (gastroesophageal reflux disease)    Headache    Hyperlipidemia    Hypertensive heart disease    Lumbar disc disease    Metabolic encephalopathy    Mixed hyperlipidemia    Myocardial infarction ([facility]    Obstructive sleep apnea    OP (osteoporosis)    Osteoarthritis    Osteoporosis    Other malaise    Overweight(278.02)    PAD (peripheral artery disease) ([facility]    a. Emboli to R foot 2010 from partially occlusive lesion in R EIA, s/p stenting. - followed by Dr. EDonnetta Hutching  b. 10/2015 ABIs: R 1.03, L 0.97.   Restless leg    Sleep apnea    Stroke ([facility]    TIA (transient  ischemic attack)    Tobacco abuse    Urge incontinence    Past Surgical History:  Procedure Laterality Date   CYST EXCISION Left 01/2021   left foot   EYE SURGERY     at age 71  hysterectomy -age 71    ILIAC ARTERY STENT     RIGHT ILIAC STENT   KNEE ARTHROSCOPY     LEFT HEART CATH  AND CORONARY ANGIOGRAPHY N/A 03/05/2022   Procedure: LEFT HEART CATH AND CORONARY ANGIOGRAPHY;  Surgeon: [name], MD;  Location: [facility];  Service: Cardiovascular;  Laterality: N/A;   LITHOTRIPSY Left 12/2020   LUMBAR LAMINECTOMY     TUBAL LIGATION      Family History  Problem Relation Age of Onset   Alzheimer's disease Mother    Hodgkin's lymphoma Brother    Lung cancer Brother    Hepatitis C Brother    Hypothyroidism Brother    Hypertension Brother    Depression Brother    Heart disease Other        Grandfather   Social History   Socioeconomic History   Marital status: Divorced    Spouse name: Not on file   Number of children: 3   Years of education: 10 th   Highest education level: Not on file  Occupational History   Occupation: DISABLED    Employer: UNEMPLOYED  Tobacco Use   Smoking status: Every Day    Packs/day: 0.50    Years: 54.00    Total pack years: 27.00    Types: Cigarettes    Last attempt to quit: 11/20/2012    Years since quitting: 9.7   Smokeless tobacco: Never   Tobacco comments:    2 ppd for many years.     04/13/2022 Patient smokes 1/2 pack daily or more if she is nervous  Substance and Sexual Activity   Alcohol use: No    Alcohol/week: 0.0 standard drinks of alcohol   Drug use: No   Sexual activity: Never  Other Topics Concern   Not on file  Social History Narrative   Patient is single with 3 children, 1 deceased.   Patient is right handed.   Patient has 10 th grade education.   Patient drinks 5 or more cups daily.   Social Determinants of Health   Financial Resource Strain: High Risk (06/07/2022)   Overall Financial Resource  Strain (CARDIA)    Difficulty of Paying Living Expenses: Hard  Food Insecurity: No Food Insecurity (08/10/2020)   Hunger Vital Sign    Worried About Running Out of Food in the Last Year: Never true    Ran Out of Food in the Last Year: Never true  Transportation Needs: No Transportation Needs (06/07/2022)   PRAPARE - Hydrologist (Medical): No    Lack of Transportation (Non-Medical): No  Physical Activity: Not on file  Stress: Stress Concern Present (01/19/2020)   [place]    Feeling of Stress : Rather much  Social Connections: Not on file    Review of Systems  Constitutional:  Positive for fatigue. Negative for chills and fever.  HENT:  Negative for congestion, rhinorrhea and sore throat.   Respiratory:  Positive for cough. Negative for shortness of breath.   Cardiovascular:  Negative for chest pain.  Gastrointestinal:  Positive for constipation. Negative for abdominal pain, diarrhea, nausea and vomiting.  Genitourinary:  Positive for dysuria, frequency and urgency.       Urinary incontinence   Musculoskeletal:  Negative for back pain and myalgias.  Neurological:  Positive for light-headedness and headaches. Negative for dizziness and weakness.  Psychiatric/Behavioral:  Positive for dysphoric mood. The patient is not nervous/anxious.      Objective:  BP 116/60   Pulse 74   Temp (!) 97.1 F (36.2 C)   Resp 18   Ht _0  (  1.651 m)   Wt 153 lb (69.4 kg)   BMI 25.46 kg/m      08/28/2022    1:49 PM 07/16/2022    2:17 PM 04/13/2022    3:52 PM  BP/Weight  Systolic BP 536 644 034  Diastolic BP 60 68 76  Wt. (Lbs) 153 151.8 159.4  BMI 25.46 kg/m2 25.26 kg/m2 26.53 kg/m2    Physical Exam Vitals reviewed.  Constitutional:      Appearance: Normal appearance. She is normal weight.  Neck:     Vascular: No carotid bruit.  Cardiovascular:     Rate and Rhythm: Normal rate and  regular rhythm.     Heart sounds: Normal heart sounds.  Pulmonary:     Effort: Pulmonary effort is normal. No respiratory distress.     Breath sounds: Normal breath sounds.  Abdominal:     General: Abdomen is flat. Bowel sounds are normal.     Palpations: Abdomen is soft.     Tenderness: There is no abdominal tenderness.  Musculoskeletal:        General: Tenderness (left knee.) present.  Neurological:     Mental Status: She is alert and oriented to person, place, and time.  Psychiatric:        Mood and Affect: Mood normal.        Behavior: Behavior normal.     Diabetic Foot Exam - Simple   No data filed      Lab Results  Component Value Date   WBC 8.5 06/11/2022   HGB 13.8 06/11/2022   HCT 43.1 06/11/2022   PLT 261 06/11/2022   GLUCOSE 197 (H) 07/02/2022   CHOL 147 03/01/2022   TRIG 134 03/01/2022   HDL 41 03/01/2022   LDLCALC 82 03/01/2022   ALT 18 07/02/2022   AST 26 07/02/2022   NA 141 07/02/2022   K 3.9 07/02/2022   CL 99 07/02/2022   CREATININE 0.96 07/02/2022   BUN 25 07/02/2022   CO2 24 07/02/2022   TSH 0.336 (L) 06/11/2022   INR 1.0 01/31/2019   HGBA1C 8.5 (H) 06/11/2022   MICROALBUR neg 02/27/2021      Assessment & Plan:   Problem List Items Addressed This Visit       Genitourinary   Acute cystitis without hematuria - Primary    Send urine culture.  Persistent symptoms, sent cipro.      Relevant Orders   Urine Culture (Completed)   POCT urinalysis dipstick (Completed)     Other   Absence of bladder continence    Refer to urology.      Relevant Orders   Ambulatory referral to Urology   Chronic pain of left knee    Recommend neoprene sleeve.  Offered physical therapy. Preferred to hold off.  Consider knee xray if persists.      .  No orders of the defined types were placed in this encounter.   Orders Placed This Encounter  Procedures   Urine Culture   Ambulatory referral to Urology   POCT urinalysis dipstick      Follow-up: Return if symptoms worsen or fail to improve.  An After Visit Summary was printed and given to the patient.  [name], MD [facility] [phone]

## 2022-08-29 LAB — URINE CULTURE

## 2022-08-30 ENCOUNTER — Telehealth: Payer: Self-pay

## 2022-08-30 ENCOUNTER — Other Ambulatory Visit: Payer: Self-pay | Admitting: Family Medicine

## 2022-08-30 MED ORDER — CIPROFLOXACIN HCL 250 MG PO TABS: 250.0000 mg | ORAL_TABLET | Freq: Two times a day (BID) | ORAL | 0 refills | Status: AC

## 2022-08-30 NOTE — Telephone Encounter (Signed)
I called patient about lab results and she stated that she is not on any antibiotics but she says she needs some because she is burning and urgency with urination.  She says [facility] uses [facility] on [address].

## 2022-08-30 NOTE — Telephone Encounter (Signed)
Patient made aware and verbalized understanding.

## 2022-08-31 ENCOUNTER — Encounter: Payer: Self-pay | Admitting: Family Medicine

## 2022-08-31 DIAGNOSIS — R32 Unspecified urinary incontinence: Secondary | ICD-10-CM | POA: Insufficient documentation

## 2022-08-31 DIAGNOSIS — G8929 Other chronic pain: Secondary | ICD-10-CM | POA: Insufficient documentation

## 2022-08-31 NOTE — Assessment & Plan Note (Signed)
Refer to urology.  ?

## 2022-08-31 NOTE — Assessment & Plan Note (Signed)
Recommend neoprene sleeve.  Offered physical therapy. Preferred to hold off.  Consider knee xray if persists.

## 2022-08-31 NOTE — Assessment & Plan Note (Signed)
Send urine culture.  Persistent symptoms, sent cipro.

## 2022-09-04 LAB — HM DIABETES EYE EXAM

## 2022-09-05 ENCOUNTER — Encounter: Payer: Self-pay | Admitting: Family Medicine

## 2022-09-07 ENCOUNTER — Telehealth: Payer: Self-pay

## 2022-09-07 NOTE — Progress Notes (Signed)
Chronic Care Management Pharmacy Assistant   Name: Sonya Small  MRN: [id number] DOB: [birth year]   Reason for Encounter: Medication Coordination for [facility]    Recent office visits:  08/28/22 Sonya Brome MD. Seen for cystitis. Referral to Urology. Sent Cipro in.   Recent consult visits:  None  Hospital visits:  None  Medications: Outpatient Encounter Medications as of 09/07/2022  Medication Sig Note   albuterol (PROVENTIL) (2.5 MG/3ML) 0.083% nebulizer solution Take 2.5 mg by nebulization every 6 (six) hours as needed for wheezing or shortness of breath.    albuterol (VENTOLIN HFA) 108 (90 Base) MCG/ACT inhaler Inhale 2 puffs into the lungs every 4 (four) hours as needed for wheezing or shortness of breath.    ALPRAZolam (XANAX) 0.5 MG tablet Take 1 tablet (0.5 mg total) by mouth 3 (three) times daily.    Aspirin-Acetaminophen (GOODYS BODY PAIN PO) Take 1 packet by mouth daily.    baclofen (LIORESAL) 10 MG tablet Take 10 mg by mouth 3 (three) times daily as needed. (Patient not taking: Reported on 08/28/2022)    blood glucose meter kit and supplies KIT Dispense based on patient and insurance preference. Use up to four times daily as directed.    BREO ELLIPTA 100-25 MCG/ACT AEPB Inhale 1 puff into the lungs daily.    clopidogrel (PLAVIX) 75 MG tablet Take 1 tablet (75 mg total) by mouth every morning.    clotrimazole (MYCELEX) 10 MG troche Take 1 tablet (10 mg total) by mouth 5 (five) times daily.    diclofenac Sodium (VOLTAREN) 1 % GEL Apply 4 g topically daily as needed (Hip and leg pain).    ezetimibe (ZETIA) 10 MG tablet Take 1 tablet (10 mg total) by mouth daily.    fenofibrate 160 MG tablet TAKE ONE CAPSULE BY MOUTH EVERY MORNING    fluticasone (FLONASE) 50 MCG/ACT nasal spray INSTILL 1 SPRAY IN EACH NOSTRIL ONCE DAILY (Patient taking differently: Place 1 spray into both nostrils daily as needed for rhinitis.)    furosemide (LASIX) 40 MG tablet Take 1 tablet (40 mg total) by  mouth daily.    gabapentin (NEURONTIN) 300 MG capsule TAKE ONE CAPSULE BY MOUTH EVERYDAY AT BEDTIME    icosapent Ethyl (VASCEPA) 1 g capsule Take 2 capsules (2 g total) by mouth 2 (two) times daily.    insulin degludec (TRESIBA) 200 UNIT/ML FlexTouch Pen Inject 10 Units into the skin daily.    lamoTRIgine (LAMICTAL) 25 MG tablet Take 2 tablets (50 mg total) by mouth at bedtime.    lansoprazole (PREVACID) 30 MG capsule TAKE ONE CAPSULE BY MOUTH BEFORE BREAKFAST    Levomilnacipran HCl ER (FETZIMA) 40 MG CP24 Take 1 capsule by mouth daily.    Levomilnacipran HCl ER 20 MG CP24 Take 20 mg by mouth 2 (two) times daily.    linaclotide (LINZESS) 145 MCG CAPS capsule Take 1 capsule (145 mcg total) by mouth daily before breakfast.    lisinopril-hydrochlorothiazide (ZESTORETIC) 20-25 MG tablet Take 1 tablet by mouth daily.    methocarbamol (ROBAXIN) 750 MG tablet Take 750 mg by mouth 3 (three) times daily. 04/13/2022: Patient takes 1 tablet at night   montelukast (SINGULAIR) 10 MG tablet Take 10 mg by mouth daily.    mupirocin ointment (BACTROBAN) 2 % Apply 1 application topically 2 (two) times daily. (Patient taking differently: Apply 1 application  topically daily as needed (Rash).)    naloxone (NARCAN) nasal spray 4 mg/0.1 mL SMARTSIG:1 Both Nares Daily  nitroGLYCERIN (NITROSTAT) 0.4 MG SL tablet Place 1 tablet (0.4 mg total) under the tongue every 5 (five) minutes as needed.    ONETOUCH ULTRA test strip DISPENSE BASED ON PATIENT AND INSURANCE PREFERENCE. USE UP TO FOUR TIMES DAILY AS DIRECTED.    oxyCODONE-acetaminophen (PERCOCET) 10-325 MG tablet Take 1 tablet by mouth every 6 (six) hours as needed for pain.    potassium chloride SA (KLOR-CON M) 20 MEQ tablet Take 1 tablet (20 mEq total) by mouth daily.    rosuvastatin (CRESTOR) 20 MG tablet TAKE ONE TABLET BY MOUTH EVERYDAY AT BEDTIME    TRUEplus Lancets 30G MISC 1 each by Does not apply route 2 (two) times daily. P59.16    TRULICITY 4.5 BW/4.6KZ  SOPN Inject 4.45m into THE SKIN ONCE WEEKLY    valACYclovir (VALTREX) 1000 MG tablet TAKE TWO TABLETS BY MOUTH twice A DAY as needed    Facility-Administered Encounter Medications as of 09/07/2022  Medication   sodium chloride flush (NS) 0.9 % injection 3 mL    Reviewed chart for medication changes ahead of medication coordination call.  No Consults, or hospital visits since last care coordination call/Pharmacist visit.   No medication changes indicated OR if recent visit, treatment plan here.  BP Readings from Last 3 Encounters:  08/28/22 116/60  07/16/22 124/68  04/13/22 (!) 142/76    Lab Results  Component Value Date   HGBA1C 8.5 (H) 06/11/2022     Patient obtains medications through Adherence Packaging  30 Days   Last adherence delivery included:  Montelukast 166m1 B Lisinopril-HCTZ 20-254m B Vascepa 1gm 2 B 2 EM Ezetimibe 47m61mB Fetzima ER 40mg43m Lansoprazole 30mg 34m Fenofibrate 160mg 152mosuvastatin 20mg 1 35mlopidogrel 75mg 1 B53motrigine 25mg 2 BT62mapentin 300mg 1 BT 106m  Furosemide 20mg 1 B Po88mium chloride er 20 meq 1 B Xanax 0.5mg- 1 three87mmes daily (Bottles)   Patient declined (meds) last month  Ventolin Inhaler -Pt still has enough Breo Ellipta Inhaler 100-25- Pt has enough on hand    Linaclotide 145mcg- Not ta7m Tizanidine 0.5 mg 2 times daily- [facility]  Flonase Nasal Spray- Plenty of supply only uses as needed  Nitroglycerin 0.4- Has enough. Only uses prn  Mupirocin Ointment 2% - Only uses prn.  Test Strips/L[facility]- [facility]. Cannot be billed through her insurance  Diclofenac gel- over supply Methocarbamol 750mg-Pain mana64mnt D/C this Trulicity-Delivered on 08/06/22  Patie99/35/70e for next adherence delivery on: 09/20/22. Called patient and reviewed medications and coordinated delivery.  This delivery to include: Montelukast 47mg 1 B Lisino17m-HCTZ 20-25mg 1 B Vascepa15m 2 B 2 EM Ezetimibe  47mg 1 B Fetzima 25m0mg 1 B Lansopraz67m30mg 1 BB Fenofibra86m60mg 1 B Rosuvastati76mmg 1 BT Clopidogrel74mg 1 B Lamotrigine 263m2 BT Gabapentin 303m1 BT       Furosemi24m0mg 1 B Potassium chlori56mr 20 meq 1 B Xanax 0.5mg- 1 three times daily (87mtles)   Patient declined the following medications  Ventolin Inhaler -Pt still has enough Breo Ellipta Inhaler 100-25- Pt has enough on hand    Linaclotide 145mcg- Not taking Tizanidin31m5 mg 2 times daily- [facility]  Flonase Nasal Spray- Plenty of supply only uses as needed  Nitroglycerin 0.4- Has enough. Only uses prn  Mupirocin Ointment 2% - Only uses prn.  Test Strips/Lancets- [facility] Cannot  be billed through her insurance  Diclofenac gel- over supply Methocarbamol 748m-Pain management D/C this Trulicity-Delivered on 122/0/2630ds \Baclofen 115m [facility] [place] 200u- Not taking   Patient needs refills-Request Sent  Furosemide 4067m Pt confirmed she is staying with [facility] at the beginning of the year and not switching pharmacies.    Confirmed delivery date of 08/21/22, advised patient that pharmacy will contact them the morning of delivery.   DanElray McgregorMAMacedoniaarmacist Assistant  [phone]

## 2022-09-10 ENCOUNTER — Other Ambulatory Visit: Payer: Self-pay

## 2022-09-10 DIAGNOSIS — I119 Hypertensive heart disease without heart failure: Secondary | ICD-10-CM

## 2022-09-10 MED ORDER — FUROSEMIDE 40 MG PO TABS: 40.0000 mg | ORAL_TABLET | Freq: Every day | ORAL | 0 refills | Status: DC

## 2022-09-12 ENCOUNTER — Encounter: Payer: Self-pay | Admitting: Family Medicine

## 2022-09-12 ENCOUNTER — Ambulatory Visit (INDEPENDENT_AMBULATORY_CARE_PROVIDER_SITE_OTHER): Payer: Medicare Other | Admitting: Family Medicine

## 2022-09-12 ENCOUNTER — Other Ambulatory Visit: Payer: Medicare Other

## 2022-09-12 VITALS — BP 118/72 | HR 97 | Temp 97.7°F | Ht 65.0 in | Wt 151.0 lb

## 2022-09-12 DIAGNOSIS — I5042 Chronic combined systolic (congestive) and diastolic (congestive) heart failure: Secondary | ICD-10-CM

## 2022-09-12 DIAGNOSIS — F330 Major depressive disorder, recurrent, mild: Secondary | ICD-10-CM

## 2022-09-12 DIAGNOSIS — F332 Major depressive disorder, recurrent severe without psychotic features: Secondary | ICD-10-CM

## 2022-09-12 DIAGNOSIS — E782 Mixed hyperlipidemia: Secondary | ICD-10-CM

## 2022-09-12 DIAGNOSIS — K5903 Drug induced constipation: Secondary | ICD-10-CM | POA: Diagnosis not present

## 2022-09-12 DIAGNOSIS — J449 Chronic obstructive pulmonary disease, unspecified: Secondary | ICD-10-CM

## 2022-09-12 DIAGNOSIS — I110 Hypertensive heart disease with heart failure: Secondary | ICD-10-CM

## 2022-09-12 DIAGNOSIS — E1140 Type 2 diabetes mellitus with diabetic neuropathy, unspecified: Secondary | ICD-10-CM

## 2022-09-12 DIAGNOSIS — I2510 Atherosclerotic heart disease of native coronary artery without angina pectoris: Secondary | ICD-10-CM

## 2022-09-12 DIAGNOSIS — Z23 Encounter for immunization: Secondary | ICD-10-CM

## 2022-09-12 DIAGNOSIS — R7989 Other specified abnormal findings of blood chemistry: Secondary | ICD-10-CM

## 2022-09-12 DIAGNOSIS — F17219 Nicotine dependence, cigarettes, with unspecified nicotine-induced disorders: Secondary | ICD-10-CM

## 2022-09-12 DIAGNOSIS — T402X5A Adverse effect of other opioids, initial encounter: Secondary | ICD-10-CM

## 2022-09-12 DIAGNOSIS — G4733 Obstructive sleep apnea (adult) (pediatric): Secondary | ICD-10-CM

## 2022-09-12 DIAGNOSIS — E1169 Type 2 diabetes mellitus with other specified complication: Secondary | ICD-10-CM

## 2022-09-12 DIAGNOSIS — I5032 Chronic diastolic (congestive) heart failure: Secondary | ICD-10-CM

## 2022-09-12 DIAGNOSIS — Z1159 Encounter for screening for other viral diseases: Secondary | ICD-10-CM

## 2022-09-12 DIAGNOSIS — E1142 Type 2 diabetes mellitus with diabetic polyneuropathy: Secondary | ICD-10-CM | POA: Diagnosis not present

## 2022-09-12 DIAGNOSIS — D352 Benign neoplasm of pituitary gland: Secondary | ICD-10-CM

## 2022-09-12 MED ORDER — GABAPENTIN 300 MG PO CAPS: 300.0000 mg | ORAL_CAPSULE | Freq: Two times a day (BID) | ORAL | 1 refills | Status: DC

## 2022-09-12 NOTE — Progress Notes (Signed)
Subjective:  Patient ID: Sonya Small, female    DOB: 1951/01/06  Age: 71 y.o. MRN: [id number]  Chief Complaint  Patient presents with   Hyperlipidemia   Diabetes   Hypertension    Diabetes:  Complications: neuropathy Glucose checking: daily fasting Glucose logs: 130-207 Hypoglycemia: no Most recent A1C: 8.5. Current medications: trulicity 3 mg weekly, neurontin 300 mg before bed.  Last Eye Exam: 09/04/2022. Foot checks: daily.  Breakfast 5:30 am Supper 2 pm Does not eat anymore.   Hyperlipidemia: Current medications: on fenofibrate 160 mg daily, rosuvastatin 20 mg daily. Vascepa 1 gm 2 capsules twice daily, and zetia 10 mg daily.    Hypertension: Complications: CORONARY ARTERY DISEASE. On plavix 75 mg daily.  Current medications: zestoretic 20/25 mg daily, lasix 20 mg daily.   COPD: on breo 100/25 mcg one inhalation daily, Albuterol hfa or nebs.  Depression: on fetzima 40 mg once daily, Lamictal 25 mg 2 before bed, xanax 0.5 mg three times a day.    09/14/2022    9:02 AM 08/28/2022    1:56 PM 01/24/2022    9:16 AM  PHQ9 SCORE ONLY  PHQ-9 Total Score _0 GERD: on prevacid 30 mg daily  Diet: healthy Exercise: none   Current Outpatient Medications on File Prior to Visit  Medication Sig Dispense Refill   albuterol (PROVENTIL) (2.5 MG/3ML) 0.083% nebulizer solution Take 2.5 mg by nebulization every 6 (six) hours as needed for wheezing or shortness of breath.     albuterol (VENTOLIN HFA) 108 (90 Base) MCG/ACT inhaler Inhale 2 puffs into the lungs every 4 (four) hours as needed for wheezing or shortness of breath.     ALPRAZolam (XANAX) 0.5 MG tablet Take 1 tablet (0.5 mg total) by mouth 3 (three) times daily. 90 tablet 2   Aspirin-Acetaminophen (GOODYS BODY PAIN PO) Take 1 packet by mouth daily.     blood glucose meter kit and supplies KIT Dispense based on patient and insurance preference. Use up to four times daily as directed. 1 each 0   BREO ELLIPTA 100-25  MCG/ACT AEPB Inhale 1 puff into the lungs daily.     clopidogrel (PLAVIX) 75 MG tablet Take 1 tablet (75 mg total) by mouth every morning. 90 tablet 2   ezetimibe (ZETIA) 10 MG tablet Take 1 tablet (10 mg total) by mouth daily. 90 tablet 1   fenofibrate 160 MG tablet TAKE ONE CAPSULE BY MOUTH EVERY MORNING 90 tablet 2   fluticasone (FLONASE) 50 MCG/ACT nasal spray INSTILL 1 SPRAY IN EACH NOSTRIL ONCE DAILY (Patient taking differently: Place 1 spray into both nostrils daily as needed for rhinitis.) 16 g 1   furosemide (LASIX) 40 MG tablet Take 1 tablet (40 mg total) by mouth daily. 90 tablet 0   icosapent Ethyl (VASCEPA) 1 g capsule Take 2 capsules (2 g total) by mouth 2 (two) times daily. 360 capsule 1   lamoTRIgine (LAMICTAL) 25 MG tablet Take 2 tablets (50 mg total) by mouth at bedtime. 180 tablet 1   lansoprazole (PREVACID) 30 MG capsule TAKE ONE CAPSULE BY MOUTH BEFORE BREAKFAST 90 capsule 3   Levomilnacipran HCl ER (FETZIMA) 40 MG CP24 Take 1 capsule by mouth daily. 90 capsule 1   linaclotide (LINZESS) 145 MCG CAPS capsule Take 1 capsule (145 mcg total) by mouth daily before breakfast. 12 capsule 0   lisinopril-hydrochlorothiazide (ZESTORETIC) 20-25 MG tablet Take 1 tablet by mouth daily. 30 tablet 3   montelukast (SINGULAIR) 10 MG tablet  Take 10 mg by mouth daily.     naloxone (NARCAN) nasal spray 4 mg/0.1 mL SMARTSIG:1 Both Nares Daily     nitroGLYCERIN (NITROSTAT) 0.4 MG SL tablet Place 1 tablet (0.4 mg total) under the tongue every 5 (five) minutes as needed. 25 tablet 1   ONETOUCH ULTRA test strip DISPENSE BASED ON PATIENT AND INSURANCE PREFERENCE. USE UP TO FOUR TIMES DAILY AS DIRECTED. 100 strip 3   oxyCODONE-acetaminophen (PERCOCET) 10-325 MG tablet Take 1 tablet by mouth every 6 (six) hours as needed for pain.     potassium chloride SA (KLOR-CON M) 20 MEQ tablet Take 1 tablet (20 mEq total) by mouth daily. 90 tablet 1   rosuvastatin (CRESTOR) 20 MG tablet TAKE ONE TABLET BY MOUTH  EVERYDAY AT BEDTIME 90 tablet 2   TRUEplus Lancets 30G MISC 1 each by Does not apply route 2 (two) times daily. E11.69 659 each 3   TRULICITY 4.5 DJ/5.7SV SOPN Inject 4.46m into THE SKIN ONCE WEEKLY 2 mL 2   valACYclovir (VALTREX) 1000 MG tablet TAKE TWO TABLETS BY MOUTH twice A DAY as needed 20 tablet 2   Current Facility-Administered Medications on File Prior to Visit  Medication Dose Route Frequency Provider Last Rate Last Admin   sodium chloride flush (NS) 0.9 % injection 3 mL  3 mL Intravenous Q12H JMartinique [name], MD       Past Medical History:  Diagnosis Date   Anxiety    CAD (coronary artery disease)    a. NSTEMI 11/2008 s/p DES to LCx (3.0x12 Xience); b. NSTEMI 01/2010 secondary to thrombotic RCA lesion (non-obstructive)-->med rx (integrilin x 24 hrs + plavix); c. 09/2012 negative Myoview.   Chronic diastolic CHF (congestive heart failure) ([facility]    a. 06/2014 Echo: EF 55-60%, no rwma, Gr1 DD, mild AI.   Depression    Dizziness    Drug induced constipation    Ganglion cyst of left foot 01/17/2021   Generalized hyperhidrosis    GERD (gastroesophageal reflux disease)    Headache    Hyperlipidemia    Hypertensive heart disease    Lumbar disc disease    Metabolic encephalopathy    Mixed hyperlipidemia    Myocardial infarction ([facility]    Obstructive sleep apnea    OP (osteoporosis)    Osteoarthritis    Osteoporosis    Other malaise    Overweight(278.02)    PAD (peripheral artery disease) ([facility]    a. Emboli to R foot 2010 from partially occlusive lesion in R EIA, s/p stenting. - followed by Dr. EDonnetta Hutching  b. 10/2015 ABIs: R 1.03, L 0.97.   Restless leg    Sleep apnea    Stroke ([facility]    TIA (transient ischemic attack)    Tobacco abuse    Urge incontinence    Past Surgical History:  Procedure Laterality Date   CYST EXCISION Left 01/2021   left foot   EYE SURGERY     at age 71  hysterectomy -age 71    ILIAC ARTERY STENT     RIGHT ILIAC STENT   KNEE ARTHROSCOPY     LEFT  HEART CATH AND CORONARY ANGIOGRAPHY N/A 03/05/2022   Procedure: LEFT HEART CATH AND CORONARY ANGIOGRAPHY;  Surgeon: VJettie Booze MD;  [facility];  Service: Cardiovascular;  Laterality: N/A;   LITHOTRIPSY Left 12/2020   LUMBAR LAMINECTOMY     TUBAL LIGATION      Family History  Problem Relation Age of Onset  Alzheimer's disease Mother    Hodgkin's lymphoma Brother    Lung cancer Brother    Hepatitis C Brother    Hypothyroidism Brother    Hypertension Brother    Depression Brother    Heart disease Other        Grandfather   Social History   Socioeconomic History   Marital status: Divorced    Spouse name: Not on file   Number of children: 3   Years of education: 10 th   Highest education level: Not on file  Occupational History   Occupation: DISABLED    Employer: UNEMPLOYED  Tobacco Use   Smoking status: Every Day    Packs/day: 2.00    Years: 54.00    Total pack years: 108.00    Types: Cigarettes    Last attempt to quit: 11/20/2012    Years since quitting: 9.8   Smokeless tobacco: Never   Tobacco comments:    2 ppd for many years.     04/13/2022 Patient smokes 1/2 pack daily or more if she is nervous  Substance and Sexual Activity   Alcohol use: No    Alcohol/week: 0.0 standard drinks of alcohol   Drug use: No   Sexual activity: Never  Other Topics Concern   Not on file  Social History Narrative   Patient is single with 3 children, 1 deceased.   Patient is right handed.   Patient has 10 th grade education.   Patient drinks 5 or more cups daily.   Social Determinants of Health   Financial Resource Strain: Low Risk  (09/14/2022)   Overall Financial Resource Strain (CARDIA)    Difficulty of Paying Living Expenses: Not hard at all  Food Insecurity: No Food Insecurity (09/14/2022)   Hunger Vital Sign    Worried About Running Out of Food in the Last Year: Never true    Ran Out of Food in the Last Year: Never true  Transportation Needs:  No Transportation Needs (09/14/2022)   PRAPARE - Hydrologist (Medical): No    Lack of Transportation (Non-Medical): No  Physical Activity: Inactive (09/14/2022)   Exercise Vital Sign    Days of Exercise per Week: 0 days    Minutes of Exercise per Session: 0 min  Stress: Stress Concern Present (09/14/2022)   [place]    Feeling of Stress : Rather much  Social Connections: Moderately Isolated (09/14/2022)   Social Connection and Isolation Panel [NHANES]    Frequency of Communication with Friends and Family: More than three times a week    Frequency of Social Gatherings with Friends and Family: Never    Attends Religious Services: More than 4 times per year    Active Member of [employer] or Organizations: No    Attends Archivist Meetings: Never    Marital Status: Divorced    Review of Systems  Constitutional:  Negative for appetite change, fatigue and fever.  HENT:  Negative for congestion, ear pain, sinus pressure and sore throat.   Respiratory:  Negative for cough, chest tightness, shortness of breath and wheezing.   Cardiovascular:  Negative for chest pain and palpitations.  Gastrointestinal:  Negative for abdominal pain, constipation, diarrhea, nausea and vomiting.  Genitourinary:  Negative for dysuria and hematuria.  Musculoskeletal:  Negative for arthralgias, back pain, joint swelling and myalgias.  Skin:  Negative for rash.  Neurological:  Negative for dizziness, weakness and headaches.  Psychiatric/Behavioral:  Negative  for dysphoric mood. The patient is not nervous/anxious.      Objective:  BP 118/72 (BP Location: Left Arm, Patient Position: Sitting)   Pulse 97   Temp 97.7 F (36.5 C) (Temporal)   Ht _0  (1.651 m)   Wt 151 lb (68.5 kg)   SpO2 97%   BMI 25.13 kg/m      09/14/2022    8:50 AM 09/12/2022   11:11 AM 08/28/2022    1:49 PM  BP/Weight  Systolic BP  627 035 009  Diastolic BP 60 72 60  Wt. (Lbs) 151 151 153  BMI 25.13 kg/m2 25.13 kg/m2 25.46 kg/m2    Physical Exam Vitals reviewed.  Constitutional:      Appearance: Normal appearance. She is normal weight.  Cardiovascular:     Rate and Rhythm: Normal rate and regular rhythm.     Heart sounds: Normal heart sounds.  Pulmonary:     Effort: Pulmonary effort is normal.     Breath sounds: Normal breath sounds.  Abdominal:     General: Abdomen is flat. Bowel sounds are normal.     Palpations: Abdomen is soft.  Neurological:     Mental Status: She is alert and oriented to person, place, and time.  Psychiatric:        Mood and Affect: Mood normal.        Behavior: Behavior normal.     Diabetic Foot Exam - Simple   Simple Foot Form  09/12/2022  7:53 PM  Visual Inspection No deformities, no ulcerations, no other skin breakdown bilaterally: Yes Sensation Testing Pulse Check Posterior Tibialis and Dorsalis pulse intact bilaterally: Yes Comments      Lab Results  Component Value Date   WBC 11.4 (H) 09/12/2022   HGB 14.6 09/12/2022   HCT 44.7 09/12/2022   PLT 316 09/12/2022   GLUCOSE 166 (H) 09/12/2022   CHOL 113 09/12/2022   TRIG 113 09/12/2022   HDL 40 09/12/2022   LDLCALC 52 09/12/2022   ALT 19 09/12/2022   AST 23 09/12/2022   NA 142 09/12/2022   K 4.6 09/12/2022   CL 101 09/12/2022   CREATININE 0.88 09/12/2022   BUN 34 (H) 09/12/2022   CO2 25 09/12/2022   TSH 0.605 09/12/2022   INR 1.0 01/31/2019   HGBA1C 7.9 (H) 09/12/2022   MICROALBUR neg 02/27/2021      Assessment & Plan:   Problem List Items Addressed This Visit       Cardiovascular and Mediastinum   Hypertensive heart disease with chronic combined systolic and diastolic congestive heart failure ([place])    The current medical regimen is effective;  continue present plan and medications. Continue zestoretic 20/25 mg daily, lasix 20 mg daily.  Recommend continue to work on eating healthy diet and  exercise.         Respiratory   COPD mixed type (HCC)    The current medical regimen is effective;  continue present plan and medications. Continue  breo 100/25 mcg one inhalation daily, Albuterol hfa or nebs.        Digestive   Therapeutic opioid-induced constipation (OIC)    Sees pain clinic.         Endocrine   Pituitary adenoma ([place])    Order brain mri.       Relevant Orders   MR Brain W Wo Contrast   Diabetic polyneuropathy associated with type 2 diabetes mellitus ([place]) - Primary    Control: improved. Recommend check sugars fasting daily. Recommend  check feet daily. Recommend annual eye exams. Medicines: Continue trulicity 4.5 mg weekly, neurontin 300 mg before bed. : improved form 8.5 down to 7.9. Increase tresiba to 20 U daily.  Continue to work on eating a healthy diet and exercise.  Labs drawn today.         Relevant Medications   gabapentin (NEURONTIN) 300 MG capsule     Nervous and Auditory   Cigarette nicotine dependence with nicotine-induced disorder    Continue trying to cut down and quit.         Other   Mixed hyperlipidemia    Well controlled.  No changes to medicines.  Continue to work on eating a healthy diet and exercise.  Labs drawn today.        Mild recurrent major depression (HCC)    Improving.  Continue fetzima 40 mg daily, lamictal 25 mg one oral twice daily, and xanax 0.5 mg three times a day.  Continues to refuse counseling.       Flu vaccine need   Relevant Orders   Flu Vaccine QUAD High Dose(Fluad) (Completed)   Need for Streptococcus pneumoniae vaccination   Relevant Orders   Pneumococcal conjugate vaccine 20-valent (Completed)   Need for hepatitis C screening test   Relevant Orders   Hepatitis C Antibody  .  Meds ordered this encounter  Medications   gabapentin (NEURONTIN) 300 MG capsule    Sig: Take 1 capsule (300 mg total) by mouth 2 (two) times daily. TAKE ONE CAPSULE BY MOUTH EVERYDAY AT BEDTIME    Dispense:   180 capsule    Refill:  1    Orders Placed This Encounter  Procedures   MR Brain W Wo Contrast   Flu Vaccine QUAD High Dose(Fluad)   Pneumococcal conjugate vaccine 20-valent   Hepatitis C Antibody     Follow-up: Return for awv this coming Friday , chronic fasting 3 months.  An After Visit Summary was printed and given to the patient.  I,Lauren M Auman,acting as a scribe for [name], MD.,have documented all relevant documentation on the behalf of [name], MD,as directed by  [name], MD while in the presence of [name], MD.   [name], MD [place] [phone]

## 2022-09-13 ENCOUNTER — Other Ambulatory Visit: Payer: Self-pay | Admitting: Family Medicine

## 2022-09-13 LAB — COMPREHENSIVE METABOLIC PANEL
ALT: 19 IU/L (ref 0–32)
AST: 23 IU/L (ref 0–40)
Albumin/Globulin Ratio: 1.7 (ref 1.2–2.2)
Albumin: 4.2 g/dL (ref 3.8–4.8)
Alkaline Phosphatase: 57 IU/L (ref 44–121)
BUN/Creatinine Ratio: 39 — ABNORMAL HIGH (ref 12–28)
BUN: 34 mg/dL — ABNORMAL HIGH (ref 8–27)
Bilirubin Total: 0.3 mg/dL (ref 0.0–1.2)
CO2: 25 mmol/L (ref 20–29)
Calcium: 9.3 mg/dL (ref 8.7–10.3)
Chloride: 101 mmol/L (ref 96–106)
Creatinine, Ser: 0.88 mg/dL (ref 0.57–1.00)
Globulin, Total: 2.5 g/dL (ref 1.5–4.5)
Glucose: 166 mg/dL — ABNORMAL HIGH (ref 70–99)
Potassium: 4.6 mmol/L (ref 3.5–5.2)
Sodium: 142 mmol/L (ref 134–144)
Total Protein: 6.7 g/dL (ref 6.0–8.5)
eGFR: 69 mL/min/{1.73_m2} (ref >59)

## 2022-09-13 LAB — CBC WITH DIFFERENTIAL/PLATELET
Basophils Absolute: 0.1 10*3/uL (ref 0.0–0.2)
Basos: 1 %
EOS (ABSOLUTE): 0.2 10*3/uL (ref 0.0–0.4)
Eos: 1 %
Hematocrit: 44.7 % (ref 34.0–46.6)
Hemoglobin: 14.6 g/dL (ref 11.1–15.9)
Immature Grans (Abs): 0.1 10*3/uL (ref 0.0–0.1)
Immature Granulocytes: 1 %
Lymphocytes Absolute: 3.5 10*3/uL — ABNORMAL HIGH (ref 0.7–3.1)
Lymphs: 31 %
MCH: 29.4 pg (ref 26.6–33.0)
MCHC: 32.7 g/dL (ref 31.5–35.7)
MCV: 90 fL (ref 79–97)
Monocytes Absolute: 1 10*3/uL — ABNORMAL HIGH (ref 0.1–0.9)
Monocytes: 9 %
Neutrophils Absolute: 6.5 10*3/uL (ref 1.4–7.0)
Neutrophils: 57 %
Platelets: 316 10*3/uL (ref 150–450)
RBC: 4.96 x10E6/uL (ref 3.77–5.28)
RDW: 12.1 % (ref 11.7–15.4)
WBC: 11.4 10*3/uL — ABNORMAL HIGH (ref 3.4–10.8)

## 2022-09-13 LAB — LIPID PANEL
Chol/HDL Ratio: 2.8 ratio (ref 0.0–4.4)
Cholesterol, Total: 113 mg/dL (ref 100–199)
HDL: 40 mg/dL (ref >39)
LDL Chol Calc (NIH): 52 mg/dL (ref 0–99)
Triglycerides: 113 mg/dL (ref 0–149)
VLDL Cholesterol Cal: 21 mg/dL (ref 5–40)

## 2022-09-13 LAB — TSH: TSH: 0.605 u[IU]/mL (ref 0.450–4.500)

## 2022-09-13 LAB — CARDIOVASCULAR RISK ASSESSMENT

## 2022-09-13 LAB — HEMOGLOBIN A1C
Est. average glucose Bld gHb Est-mCnc: 180 mg/dL
Hgb A1c MFr Bld: 7.9 % — ABNORMAL HIGH (ref 4.8–5.6)

## 2022-09-13 MED ORDER — INSULIN DEGLUDEC 200 UNIT/ML ~~LOC~~ SOPN: 20.0000 [IU] | PEN_INJECTOR | Freq: Every day | SUBCUTANEOUS | 0 refills | Status: DC

## 2022-09-13 NOTE — Progress Notes (Signed)
Blood count abnormal. Wbc elevated a little.  Liver function normal.  Kidney function normal.  Thyroid function normal.  Cholesterol:good HBA1C: improved form 8.5 down to 7.9. Increase tresiba to 20 U daily.  Dr. [name]

## 2022-09-14 ENCOUNTER — Encounter: Payer: Self-pay | Admitting: Nurse Practitioner

## 2022-09-14 ENCOUNTER — Ambulatory Visit (INDEPENDENT_AMBULATORY_CARE_PROVIDER_SITE_OTHER): Payer: Medicare Other | Admitting: Nurse Practitioner

## 2022-09-14 VITALS — BP 114/60 | HR 95 | Temp 96.5°F | Ht 65.0 in | Wt 151.0 lb

## 2022-09-14 DIAGNOSIS — Z13820 Encounter for screening for osteoporosis: Secondary | ICD-10-CM | POA: Diagnosis not present

## 2022-09-14 DIAGNOSIS — Z780 Asymptomatic menopausal state: Secondary | ICD-10-CM | POA: Diagnosis not present

## 2022-09-14 DIAGNOSIS — Z0000 Encounter for general adult medical examination without abnormal findings: Secondary | ICD-10-CM | POA: Diagnosis not present

## 2022-09-14 NOTE — Progress Notes (Signed)
Subjective:   Sonya Small is a 71 y.o. female who presents for Medicare Annual (Subsequent) preventive visit.  Review of Systems    negative       Objective:    BP 114/60 (BP Location: Left Arm, Patient Position: Sitting, Cuff Size: Normal)   Pulse 95   Temp (!) 96.5 F (35.8 C) (Temporal)   Ht _0  (1.651 m)   Wt 151 lb (68.5 kg)   SpO2 96%   BMI 25.13 kg/m        03/05/2022    7:41 AM 12/03/2019    2:15 PM 02/02/2019    1:00 AM 01/31/2019    3:00 PM 02/22/2015    9:57 AM 07/12/2014    6:49 PM 10/16/2012    5:02 AM  Advanced Directives  Does Patient Have a Medical Advance Directive? No Yes  No No No Patient does not have advance directive;Patient would like information  Type of Advance Directive  [place];Living will       Would patient like information on creating a medical advance directive? No - Patient declined  No - Patient declined  No - patient declined information No - patient declined information Advance directive packet given  Pre-existing out of facility DNR order (yellow form or pink MOST form)       No    Current Medications (verified) Outpatient Encounter Medications as of 09/14/2022  Medication Sig   albuterol (PROVENTIL) (2.5 MG/3ML) 0.083% nebulizer solution Take 2.5 mg by nebulization every 6 (six) hours as needed for wheezing or shortness of breath.   albuterol (VENTOLIN HFA) 108 (90 Base) MCG/ACT inhaler Inhale 2 puffs into the lungs every 4 (four) hours as needed for wheezing or shortness of breath.   ALPRAZolam (XANAX) 0.5 MG tablet Take 1 tablet (0.5 mg total) by mouth 3 (three) times daily.   Aspirin-Acetaminophen (GOODYS BODY PAIN PO) Take 1 packet by mouth daily.   baclofen (LIORESAL) 10 MG tablet Take 10 mg by mouth 3 (three) times daily as needed.   blood glucose meter kit and supplies KIT Dispense based on patient and insurance preference. Use up to four times daily as directed.   BREO ELLIPTA 100-25 MCG/ACT AEPB Inhale 1  puff into the lungs daily.   clopidogrel (PLAVIX) 75 MG tablet Take 1 tablet (75 mg total) by mouth every morning.   clotrimazole (MYCELEX) 10 MG troche Take 1 tablet (10 mg total) by mouth 5 (five) times daily.   diclofenac Sodium (VOLTAREN) 1 % GEL Apply 4 g topically daily as needed (Hip and leg pain).   ezetimibe (ZETIA) 10 MG tablet Take 1 tablet (10 mg total) by mouth daily.   fenofibrate 160 MG tablet TAKE ONE CAPSULE BY MOUTH EVERY MORNING   fluticasone (FLONASE) 50 MCG/ACT nasal spray INSTILL 1 SPRAY IN EACH NOSTRIL ONCE DAILY (Patient taking differently: Place 1 spray into both nostrils daily as needed for rhinitis.)   furosemide (LASIX) 40 MG tablet Take 1 tablet (40 mg total) by mouth daily.   gabapentin (NEURONTIN) 300 MG capsule Take 1 capsule (300 mg total) by mouth 2 (two) times daily. TAKE ONE CAPSULE BY MOUTH EVERYDAY AT BEDTIME   icosapent Ethyl (VASCEPA) 1 g capsule Take 2 capsules (2 g total) by mouth 2 (two) times daily.   insulin degludec (TRESIBA) 200 UNIT/ML FlexTouch Pen Inject 20 Units into the skin daily.   lamoTRIgine (LAMICTAL) 25 MG tablet Take 2 tablets (50 mg total) by mouth at bedtime.  lansoprazole (PREVACID) 30 MG capsule TAKE ONE CAPSULE BY MOUTH BEFORE BREAKFAST   Levomilnacipran HCl ER (FETZIMA) 40 MG CP24 Take 1 capsule by mouth daily.   Levomilnacipran HCl ER 20 MG CP24 Take 20 mg by mouth 2 (two) times daily.   linaclotide (LINZESS) 145 MCG CAPS capsule Take 1 capsule (145 mcg total) by mouth daily before breakfast.   lisinopril-hydrochlorothiazide (ZESTORETIC) 20-25 MG tablet Take 1 tablet by mouth daily.   methocarbamol (ROBAXIN) 750 MG tablet Take 750 mg by mouth 3 (three) times daily.   montelukast (SINGULAIR) 10 MG tablet Take 10 mg by mouth daily.   mupirocin ointment (BACTROBAN) 2 % Apply 1 application topically 2 (two) times daily. (Patient taking differently: Apply 1 application  topically daily as needed (Rash).)   naloxone (NARCAN) nasal spray  4 mg/0.1 mL SMARTSIG:1 Both Nares Daily   nitroGLYCERIN (NITROSTAT) 0.4 MG SL tablet Place 1 tablet (0.4 mg total) under the tongue every 5 (five) minutes as needed.   ONETOUCH ULTRA test strip DISPENSE BASED ON PATIENT AND INSURANCE PREFERENCE. USE UP TO FOUR TIMES DAILY AS DIRECTED.   oxyCODONE-acetaminophen (PERCOCET) 10-325 MG tablet Take 1 tablet by mouth every 6 (six) hours as needed for pain.   potassium chloride SA (KLOR-CON M) 20 MEQ tablet Take 1 tablet (20 mEq total) by mouth daily.   rosuvastatin (CRESTOR) 20 MG tablet TAKE ONE TABLET BY MOUTH EVERYDAY AT BEDTIME   TRUEplus Lancets 30G MISC 1 each by Does not apply route 2 (two) times daily. T65.46   TRULICITY 4.5 TK/3.5WS SOPN Inject 4.2m into THE SKIN ONCE WEEKLY   valACYclovir (VALTREX) 1000 MG tablet TAKE TWO TABLETS BY MOUTH twice A DAY as needed   Facility-Administered Encounter Medications as of 09/14/2022  Medication   sodium chloride flush (NS) 0.9 % injection 3 mL    Allergies (verified) Abilify [aripiprazole] and Latex   History: Past Medical History:  Diagnosis Date   Anxiety    CAD (coronary artery disease)    a. NSTEMI 11/2008 s/p DES to LCx (3.0x12 Xience); b. NSTEMI 01/2010 secondary to thrombotic RCA lesion (non-obstructive)-->med rx (integrilin x 24 hrs + plavix); c. 09/2012 negative Myoview.   [place] [place]    a. 06/2014 Echo: EF 55-60%, no rwma, Gr1 DD, mild AI.   Depression    Dizziness    Drug induced constipation    Ganglion cyst of left foot 01/17/2021   Generalized hyperhidrosis    GERD (gastroesophageal reflux disease)    Headache    Hyperlipidemia    Hypertensive heart disease    Lumbar disc disease    Metabolic encephalopathy    Mixed hyperlipidemia    [facility]    Obstructive sleep apnea    OP (osteoporosis)    Osteoarthritis    Osteoporosis    Other malaise    Overweight(278.02)    PAD (peripheral artery disease) ([facility]    a.  Emboli to R foot 2010 from partially occlusive lesion in R EIA, s/p stenting. - followed by Dr. EDonnetta Hutching  b. 10/2015 ABIs: R 1.03, L 0.97.   Restless leg    Sleep apnea    St[facility] ([facility]    TIA (transient ischemic attack)    Tobacco abuse    Urge incontinence    Past Surgical History:  Procedure Laterality Date   CYST EXCISION Left 01/2021   left foot   EYE SURGERY     at age 71  hysterectomy -age 71  ILIAC ARTERY STENT     RIGHT ILIAC STENT   KNEE ARTHROSCOPY     LEFT HEART CATH AND CORONARY ANGIOGRAPHY N/A 03/05/2022   Procedure: LEFT HEART CATH AND CORONARY ANGIOGRAPHY;  Surgeon: [name], MD;  Location: [facility];  Service: Cardiovascular;  Laterality: N/A;   LITHOTRIPSY Left 12/2020   LUMBAR LAMINECTOMY     TUBAL LIGATION     Family History  Problem Relation Age of Onset   Alzheimer's disease Mother    Hodgkin's lymphoma Brother    Lung cancer Brother    Hepatitis C Brother    Hypothyroidism Brother    Hypertension Brother    Depression Brother    Heart disease Other        Grandfather   Social History   Socioeconomic History   Marital status: Divorced    Spouse name: Not on file   Number of children: 3   Years of education: 10 th   Highest education level: Not on file  Occupational History   Occupation: DISABLED    Employer: UNEMPLOYED  Tobacco Use   Smoking status: Every Day    Packs/day: 0.50    Years: 54.00    Total pack years: 27.00    Types: Cigarettes    Last attempt to quit: 11/20/2012    Years since quitting: 9.8   Smokeless tobacco: Never   Tobacco comments:    2 ppd for many years.     04/13/2022 Patient smokes 1/2 pack daily or more if she is nervous  Substance and Sexual Activity   Alcohol use: No    Alcohol/week: 0.0 standard drinks of alcohol   Drug use: No   Sexual activity: Never  Other Topics Concern   Not on file  Social History Narrative   Patient is single with 3 children, 1 deceased.   Patient is right  handed.   Patient has 10 th grade education.   Patient drinks 5 or more cups daily.   Social Determinants of Health   Financial Resource Strain: High Risk (06/07/2022)   Overall Financial Resource Strain (CARDIA)    Difficulty of Paying Living Expenses: Hard  Food Insecurity: No Food Insecurity (08/10/2020)   Hunger Vital Sign    Worried About Running Out of Food in the Last Year: Never true    Ran Out of Food in the Last Year: Never true  Transportation Needs: No Transportation Needs (06/07/2022)   PRAPARE - Hydrologist (Medical): No    Lack of Transportation (Non-Medical): No  Physical Activity: Not on file  Stress: Stress Concern Present (01/19/2020)   [place]    Feeling of Stress : Rather much  Social Connections: Not on file    Tobacco Counseling Ready to quit: Not Answered Counseling given: Not Answered Tobacco comments: 2 ppd for many years.  04/13/2022 Patient smokes 1/2 pack daily or more if she is nervous   Clinical Intake:                 Diabetic?YES         Activities of Daily Living     No data to display           Patient Care Team: [name], MD as PCP - General (Family Medicine) [name], [name], MD as PCP - Cardiology (Cardiology) Lane Hacker, [facility] (Pharmacist)  Indicate any recent Medical Services you may have received from other  than [facility] providers in the past year (date may be approximate).     Assessment:   This is a routine wellness examination for Sonya Small.   Dietary issues and exercise activities discussed:     Goals Addressed   Complete advance directive packet and return to office   Depression Screen    08/28/2022    1:56 PM 01/24/2022    9:16 AM 10/18/2021   10:44 PM 09/07/2021   11:05 AM 07/27/2021    8:57 PM 06/04/2021    5:54 PM 05/08/2021   11:01 AM  PHQ 2/9 Scores  PHQ - 2 Score _0 PHQ- 9 Score  _1 Fall Risk    09/12/2022   11:08 AM 09/07/2021   11:04 AM 07/25/2021   11:04 AM 06/02/2021   10:08 AM 02/27/2021    8:58 AM  Fall Risk   Falls in the past year? 0 1 1 0 1  Number falls in past yr: 0 1 1 0 0  Injury with Fall? 0 0 0 0 1  Risk for fall due to : No Fall Risks   No Fall Risks   Follow up Falls evaluation completed Falls evaluation completed Falls evaluation completed Falls evaluation completed     [place]:  Any stairs in or around the home? Yes  If so, are there any without handrails? No  Home free of loose throw rugs in walkways, pet beds, electrical cords, etc? Yes  Adequate lighting in your home to reduce risk of falls? NO  ASSISTIVE DEVICES UTILIZED TO PREVENT FALLS:  Life alert? No  Use of a cane, walker or w/c? Yes  Grab bars in the bathroom? No  Shower chair or bench in shower? No  Elevated toilet seat or a handicapped toilet? Yes   TIMED UP AND GO:  Was the test performed? Yes .  Length of time to ambulate 10 feet: 15 sec.   Gait slow and steady without use of assistive device  Cognitive Function:        Immunizations Immunization History  Administered Date(s) Administered   Fluad Quad(high Dose 65+) 08/29/2020, 06/02/2021, 09/12/2022   Influenza,inj,Quad PF,6+ Mos 07/13/2014   Influenza-Unspecified 05/29/2019   Moderna Sars-Covid-2 Vaccination 07/11/2020, 08/01/2020   PNEUMOCOCCAL CONJUGATE-20 09/12/2022   Pneumococcal Conjugate-13 01/20/2015   Pneumococcal Polysaccharide-23 06/26/2011    TDAP status: Due, Education has been provided regarding the importance of this vaccine. Advised may receive this vaccine at local pharmacy or Health Dept. Aware to provide a copy of the vaccination record if obtained from local pharmacy or Health Dept. Verbalized acceptance and understanding.  Flu Vaccine status: Up to date  Pneumococcal vaccine status: Up to date  Covid-19 vaccine status:  Completed vaccines  Qualifies for Shingles Vaccine? Yes   Zostavax completed No   Shingrix Completed?: No.    Education has been provided regarding the importance of this vaccine. Patient has been advised to call insurance company to determine out of pocket expense if they have not yet received this vaccine. Advised may also receive vaccine at local pharmacy or Health Dept. Verbalized acceptance and understanding.  Screening Tests Health Maintenance  Topic Date Due   Hepatitis C Screening  Never done   DTaP/Tdap/Td (1 - Tdap) Never done   COLONOSCOPY (Pts 45-3yr Insurance coverage will need to be confirmed)  Never done   MAMMOGRAM  Never done   Zoster  Vaccines- Shingrix (1 of 2) Never done   FOOT EXAM  11/25/2021   Lung Cancer Screening  03/02/2022   COVID-19 Vaccine (3 - 2023-24 season) 05/25/2022   Diabetic kidney evaluation - Urine ACR  01/25/2023   HEMOGLOBIN A1C  03/14/2023   OPHTHALMOLOGY EXAM  09/05/2023   Diabetic kidney evaluation - eGFR measurement  09/13/2023   Medicare Annual Wellness (AWV)  09/15/2023   Pneumonia Vaccine 41+ Years old  Completed   INFLUENZA VACCINE  Completed   DEXA SCAN  Completed   HPV VACCINES  Aged Out    Health Maintenance  Health Maintenance Due  Topic Date Due   Hepatitis C Screening  Never done   DTaP/Tdap/Td (1 - Tdap) Never done   COLONOSCOPY (Pts 45-39yr Insurance coverage will need to be confirmed)  Never done   MAMMOGRAM  Never done   Zoster Vaccines- Shingrix (1 of 2) Never done   FOOT EXAM  11/25/2021   Lung Cancer Screening  03/02/2022   COVID-19 Vaccine (3 - 2023-24 season) 05/25/2022    Colorectal cancer screening: No longer required.   Mammogram status: No longer required due to Patient denied.  Bone Density status: Ordered 09/14/2022. Pt provided with contact info and advised to call to schedule appt.  Lung Cancer Screening: (Low Dose CT Chest recommended if Age 73-80years, 30 pack-year currently smoking OR have  quit w/in 15years.) does qualify.   Lung Cancer Screening Referral: Ordered  Additional Screening:  Hepatitis C Screening: does qualify; Completed 09/12/2022  Vision Screening: Recommended annual ophthalmology exams for early detection of glaucoma and other disorders of the eye. Is the patient up to date with their annual eye exam?  Yes  Who is the provider or what is the name of the office in which the patient attends annual eye exams? Dr.groat If pt is not established with a provider, would they like to be referred to a provider to establish care? No .   Dental Screening: Recommended annual dental exams for proper oral hygiene  Community Resource Referral / Chronic Care Management: CRR required this visit?  No   CCM required this visit?  No      Plan:     1. Encounter for Medicare annual wellness exam  2. Encounter for osteoporosis screening in asymptomatic postmenopausal patient - DG Bone Density; Future    I have personally reviewed and noted the following in the patient's chart:   Medical and social history Use of alcohol, tobacco or illicit drugs  Current medications and supplements including opioid prescriptions. Patient is currently taking opioid prescriptions. Information provided to patient regarding non-opioid alternatives. Patient advised to discuss non-opioid treatment plan with their provider. Functional ability and status Nutritional status Physical activity Advanced directives List of other physicians Hospitalizations, surgeries, and ER visits in previous 12 months Vitals Screenings to include cognitive, depression, and falls Referrals and appointments  In addition, I have reviewed and discussed with patient certain preventive protocols, quality metrics, and best practice recommendations. A written personalized care plan for preventive services as well as general preventive health recommendations were provided to patient.    I, SRip Harbour NP,  have reviewed all documentation for this visit. The documentation on 09/14/22 for the exam, diagnosis, procedures, and orders are all accurate and complete.    Signed, SJerrell Belfast DNP

## 2022-09-14 NOTE — Patient Instructions (Signed)
We will call you with bone mineral density test mid-January Follow-up in 1 year  [facility] Your bones do more than support your body. They also store calcium. The inside of your bones (marrow) makes blood cells. Maintaining bone health becomes more important as you age because your bones replace all their cells about every 10 years. Around age 73, it gets harder to replace those cells, and bones can become weak. Weak bones can lead to osteoporosis and breaks (fractures). Falls also become more likely, which can cause fractures. The good news is that, with diet and exercise, you can improve and maintain your bone health at any age. How to eat for bone health A balanced diet can supply many of the vitamins, minerals, and proteins you need for bone health. Older adults need to make sure they get enough calcium, vitamin D, and magnesium. You may need more of these as you age. Calcium  Calcium is the most important (essential) mineral for bone health. The daily requirement for calcium for adult men aged 60-70 years is 1,000 mg. For adult women aged 86-70 years, it is 1,200 mg. At age 55 or older, it is 1,200 mg for both men and women. Sources of calcium in your diet include: Dairy foods like milk, yogurt, and cheese. Dairy foods are the best sources. If you cannot eat dairy, you may need a calcium supplement. Leafy, dark green vegetables. These include collard greens, kale, broccoli, bok choy, and okra. Fatty fish, like sardines and canned salmon with bones. Almonds. Tofu.  Vitamin D You need vitamin D for bone health because it helps your body absorb calcium from your diet. It is not found naturally in many foods. Many people can benefit from taking a supplement. The daily requirement for vitamin D at age 73 or older is 600-1,000 international units (IU). Dietary sources for vitamin D include: Fatty fish, such as swordfish, salmon, sardine, and mackerel. Foods that  have vitamin D added to them (are fortified), like cereal and dairy products. Egg yolks. Magnesium Magnesium helps your body use both calcium and vitamin D. The recommended daily intake for adult men is 400-420 mg. For adult women, it is 310-320 mg. Dietary sources of magnesium include: Green vegetables, such as collard greens, kale, bok choy, and okra. Poppy, sesame, and chia seeds. Legumes, including peas and beans. Whole grains. Avocados. Nuts. If you drink alcohol regularly or take a type of antacid called a proton pump inhibitor, you might benefit from a magnesium supplement. How to exercise for bone health Exercise is important for bone health because it strengthens bones and muscles. Bones are living organs that get stronger when you exercise them, just like your heart and other muscles. Muscles support your bones and protect your joints. Strong muscles also help prevent bone loss and falls. Weight-bearing exercises and resistance exercises are the two types of exercise that are most important for bone health. Weight-bearing exercises include running or walking, climbing stairs, and playing sports like tennis. Resistance exercises include those done using free weights, weight machines, or resistance bands. Try to get at least 30 minutes of exercise every day. You can also include stretching, balance, and flexibility exercises, like yoga or tai chi. These lower your risk of falls. Follow these instructions at home Ask your health care provider if: You need a bone density test. This is especially important for women older than 73 and men older than 70. You have been losing height. Loss of height  may be a sign of weakening bones in your spine. Any of your medicines or medical conditions could affect your bone health. He or she could recommend an exercise program that is safe for you. Be sure it includes both weight-bearing and resistance exercises. Do not use any products that contain  nicotine or tobacco. These products include cigarettes, chewing tobacco, and vaping devices, such as e-cigarettes. These can reduce bone density. If you need help quitting, ask your health care provider. Drink alcohol in moderation. Alcohol reduces bone density and increases your risk for falls. If you drink alcohol: Limit how much you have to: 0-1 drink a day for women who are not pregnant. 0-2 drinks a day for men. Know how much alcohol is in a drink. In the [place] one drink equals one 12 oz bottle of beer (355 mL), one 5 oz glass of wine (148 mL), or one 1 oz glass of hard liquor (44 mL). Take over-the-counter and prescription medicines only as told by your health care provider. Ask your health care provider if you could benefit from taking calcium, vitamin D, or magnesium supplements. Keep all follow-up visits. This is important. Where to find more information American Bone Health: CardKnowledge.fi American Academy of Orthopa[employer] Surgeons: orthoinfo.[place]: bones.SouthExposed.es Summary Maintaining your bone health becomes more important as you age. You can improve and maintain your bone health with diet and exercise at any age. A balanced diet can supply many of the vitamins, minerals, and proteins you need for bone health. Older adults need to make sure they get enough calcium, vitamin D, and magnesium. Ask your health care provider if you could benefit from taking calcium, vitamin D, or magnesium supplements. Exercise is important for bone health because it strengthens bones and muscles. Do both weight-bearing and resistance exercises. This information is not intended to replace advice given to you by your health care provider. Make sure you discuss any questions you have with your health care provider. Document Revised: 02/22/2021 Document Reviewed: 02/22/2021 Elsevier Patient Education  [place] Directive  Advance directives  are legal documents that allow you to make decisions about your health care and medical treatment in case you become unable to communicate for yourself. Advance directives let your wishes be known to family, friends, and health care providers. Discussing and writing advance directives should happen over time rather than all at once. Advance directives can be changed and updated at any time. There are different types of advance directives, such as: Medical power of attorney. Living will. Do not resuscitate (DNR) order or do not attempt resuscitation (DNAR) order. Health care proxy and medical power of attorney A health care proxy is also called a health care agent. This person is appointed to make medical decisions for you when you are unable to make decisions for yourself. Generally, people ask a trusted friend or family member to act as their proxy and represent their preferences. Make sure you have an agreement with your trusted person to act as your proxy. A proxy may have to make a medical decision on your behalf if your wishes are not known. A medical power of attorney, also called a durable power of attorney for health care, is a legal document that names your health care proxy. Depending on the laws in your state, the document may need to be: Signed. Notarized. Dated. Copied. Witnessed. Incorporated into your medical record. You may also want to appoint a trusted person to manage  your money in the event you are unable to do so. This is called a durable power of attorney for finances. It is a separate legal document from the durable power of attorney for health care. You may choose your health care proxy or someone different to act as your agent in money matters. If you do not appoint a proxy, or there is a concern that the proxy is not acting in your best interest, a court may appoint a guardian to act on your behalf. Living will A living will is a set of instructions that state your wishes  about medical care when you cannot express them yourself. Health care providers should keep a copy of your living will in your medical record. You may want to give a copy to family members or friends. To alert caregivers in case of an emergency, you can place a card in your wallet to let them know that you have a living will and where they can find it. A living will is used if you become: Terminally ill. Disabled. Unable to communicate or make decisions. The following decisions should be included in your living will: To use or not to use life support equipment, such as dialysis machines and breathing machines (ventilators). Whether you want a DNR or DNAR order. This tells health care providers not to use cardiopulmonary resuscitation (CPR) if breathing or heartbeat stops. To use or not to use tube feeding. To be given or not to be given food and fluids. Whether you want comfort (palliative) care when the goal becomes comfort rather than a cure. Whether you want to donate your organs and tissues. A living will does not give instructions for distributing your money and property if you should pass away. DNR or DNAR A DNR or DNAR order is a request not to have CPR in the event that your heart stops beating or you stop breathing. If a DNR or DNAR order has not been made and shared, a health care provider will try to help any patient whose heart has stopped or who has stopped breathing. If you plan to have surgery, talk with your health care provider about how your DNR or DNAR order will be followed if problems occur. What if I do not have an advance directive? Some states assign family decision makers to act on your behalf if you do not have an advance directive. Each state has its own laws about advance directives. You may want to check with your health care provider, attorney, or state representative about the laws in your state. Summary Advance directives are legal documents that allow you to make  decisions about your health care and medical treatment in case you become unable to communicate for yourself. The process of discussing and writing advance directives should happen over time. You can change and update advance directives at any time. Advance directives may include a medical power of attorney, a living will, and a DNR or DNAR order. This information is not intended to replace advice given to you by your health care provider. Make sure you discuss any questions you have with your health care provider. Document Revised: 06/14/2020 Document Reviewed: 06/14/2020 Elsevier Patient Education  [place].

## 2022-09-18 DIAGNOSIS — Z1159 Encounter for screening for other viral diseases: Secondary | ICD-10-CM | POA: Insufficient documentation

## 2022-09-18 DIAGNOSIS — Z23 Encounter for immunization: Secondary | ICD-10-CM | POA: Insufficient documentation

## 2022-09-18 NOTE — Assessment & Plan Note (Addendum)
The current medical regimen is effective;  continue present plan and medications. Continue zestoretic 20/25 mg daily, lasix 20 mg daily.  Recommend continue to work on eating healthy diet and exercise.

## 2022-09-18 NOTE — Assessment & Plan Note (Addendum)
Control: improved. Recommend check sugars fasting daily. Recommend check feet daily. Recommend annual eye exams. Medicines: Continue trulicity 4.5 mg weekly, neurontin 300 mg before bed. : improved form 8.5 down to 7.9. Increase tresiba to 20 U daily.  Continue to work on eating a healthy diet and exercise.  Labs drawn today.

## 2022-09-18 NOTE — Assessment & Plan Note (Signed)
Well controlled.  ?No changes to medicines.  ?Continue to work on eating a healthy diet and exercise.  ?Labs drawn today.  ?

## 2022-09-18 NOTE — Assessment & Plan Note (Signed)
Sees pain clinic.

## 2022-09-18 NOTE — Assessment & Plan Note (Signed)
Continue trying to cut down and quit.

## 2022-09-18 NOTE — Assessment & Plan Note (Signed)
The current medical regimen is effective;  continue present plan and medications. Continue  breo 100/25 mcg one inhalation daily, Albuterol hfa or nebs.

## 2022-09-18 NOTE — Assessment & Plan Note (Signed)
Order brain mri.

## 2022-09-18 NOTE — Assessment & Plan Note (Signed)
Improving.  Continue fetzima 40 mg daily, lamictal 25 mg one oral twice daily, and xanax 0.5 mg three times a day.  Continues to refuse counseling.

## 2022-09-20 ENCOUNTER — Other Ambulatory Visit: Payer: Self-pay | Admitting: Family Medicine

## 2022-09-20 MED ORDER — DAPAGLIFLOZIN PROPANEDIOL 5 MG PO TABS: 5.0000 mg | ORAL_TABLET | Freq: Every day | ORAL | 0 refills | Status: DC

## 2022-10-04 ENCOUNTER — Telehealth: Payer: Medicare Other

## 2022-10-06 LAB — SPECIMEN STATUS REPORT

## 2022-10-06 LAB — HEPATITIS C ANTIBODY: Hep C Virus Ab: NONREACTIVE

## 2022-10-08 ENCOUNTER — Other Ambulatory Visit: Payer: Self-pay | Admitting: Family Medicine

## 2022-10-10 ENCOUNTER — Other Ambulatory Visit: Payer: Self-pay | Admitting: Family Medicine

## 2022-10-10 DIAGNOSIS — E1142 Type 2 diabetes mellitus with diabetic polyneuropathy: Secondary | ICD-10-CM

## 2022-10-10 MED ORDER — ONETOUCH ULTRA VI STRP: ORAL_STRIP | 3 refills | Status: DC

## 2022-10-10 MED ORDER — TRUEPLUS LANCETS 30G MISC: 1.0000 | Freq: Two times a day (BID) | 3 refills | Status: DC

## 2022-10-10 MED ORDER — TRULICITY 4.5 MG/0.5ML ~~LOC~~ SOAJ: SUBCUTANEOUS | 1 refills | Status: DC

## 2022-10-12 ENCOUNTER — Other Ambulatory Visit: Payer: Self-pay | Admitting: Family Medicine

## 2022-10-12 DIAGNOSIS — R5383 Other fatigue: Secondary | ICD-10-CM

## 2022-10-18 ENCOUNTER — Other Ambulatory Visit: Payer: Self-pay | Admitting: Family Medicine

## 2022-10-19 ENCOUNTER — Other Ambulatory Visit: Payer: Self-pay

## 2022-10-19 DIAGNOSIS — I119 Hypertensive heart disease without heart failure: Secondary | ICD-10-CM

## 2022-10-19 DIAGNOSIS — R5383 Other fatigue: Secondary | ICD-10-CM

## 2022-10-19 MED ORDER — DAPAGLIFLOZIN PROPANEDIOL 5 MG PO TABS: 5.0000 mg | ORAL_TABLET | Freq: Every day | ORAL | 0 refills | Status: DC

## 2022-10-19 MED ORDER — ALPRAZOLAM 0.5 MG PO TABS: 0.5000 mg | ORAL_TABLET | Freq: Three times a day (TID) | ORAL | 0 refills | Status: DC

## 2022-10-19 MED ORDER — FUROSEMIDE 40 MG PO TABS: 40.0000 mg | ORAL_TABLET | Freq: Every day | ORAL | 0 refills | Status: DC

## 2022-10-26 ENCOUNTER — Encounter: Payer: Self-pay | Admitting: Family Medicine

## 2022-10-26 ENCOUNTER — Ambulatory Visit (INDEPENDENT_AMBULATORY_CARE_PROVIDER_SITE_OTHER): Payer: Medicare Other | Admitting: Family Medicine

## 2022-10-26 ENCOUNTER — Telehealth: Payer: Self-pay | Admitting: Pharmacist

## 2022-10-26 ENCOUNTER — Other Ambulatory Visit: Payer: Medicare Other | Admitting: Pharmacist

## 2022-10-26 VITALS — BP 118/68 | HR 100 | Temp 97.8°F | Ht 65.0 in | Wt 155.8 lb

## 2022-10-26 DIAGNOSIS — R3589 Other polyuria: Secondary | ICD-10-CM | POA: Diagnosis not present

## 2022-10-26 DIAGNOSIS — R202 Paresthesia of skin: Secondary | ICD-10-CM

## 2022-10-26 DIAGNOSIS — J449 Chronic obstructive pulmonary disease, unspecified: Secondary | ICD-10-CM

## 2022-10-26 DIAGNOSIS — R5383 Other fatigue: Secondary | ICD-10-CM | POA: Diagnosis not present

## 2022-10-26 DIAGNOSIS — E1142 Type 2 diabetes mellitus with diabetic polyneuropathy: Secondary | ICD-10-CM

## 2022-10-26 DIAGNOSIS — E1169 Type 2 diabetes mellitus with other specified complication: Secondary | ICD-10-CM | POA: Insufficient documentation

## 2022-10-26 LAB — POCT URINALYSIS DIP (CLINITEK)
Bilirubin, UA: NEGATIVE
Blood, UA: NEGATIVE
Glucose, UA: >=1000 mg/dL — AB
Ketones, POC UA: NEGATIVE mg/dL
Leukocytes, UA: NEGATIVE
Nitrite, UA: NEGATIVE
POC PROTEIN,UA: NEGATIVE
Spec Grav, UA: 1.01
Urobilinogen, UA: 0.2 U/dL
pH, UA: 6

## 2022-10-26 MED ORDER — TIRZEPATIDE 2.5 MG/0.5ML ~~LOC~~ SOAJ: 2.5000 mg | SUBCUTANEOUS | 0 refills | Status: DC

## 2022-10-26 MED ORDER — DAPAGLIFLOZIN PROPANEDIOL 10 MG PO TABS: 10.0000 mg | ORAL_TABLET | Freq: Every day | ORAL | 2 refills | Status: DC

## 2022-10-26 MED ORDER — TRULICITY 4.5 MG/0.5ML ~~LOC~~ SOAJ: 4.5000 mg | SUBCUTANEOUS | 1 refills | Status: DC

## 2022-10-26 NOTE — Progress Notes (Signed)
Subjective:  Patient ID: Sonya Small, female    DOB: 06-02-1951  Age: 72 y.o. MRN: [id number]  Chief Complaint  Patient presents with   Discuss sugars     Feeling bad/increased urine/numbness in toes and finger    HPI Patient is a 72 yo WF with pmhx of diabetes, cad, copd, hyperlipidemia, stroke, hypertension, chf, and depression who presents complaining of severe fatigue and malaise. Patient is having urinary frequency. Sugars 141-265. No trulicity for 4 weeks. Patient is having numbness in fingers and toes. Patient has chornic shortness of breath. Has had some headaches.          09/14/2022    9:02 AM 08/28/2022    1:56 PM 01/24/2022    9:16 AM 10/18/2021   10:44 PM 09/07/2021   11:05 AM  Depression screen PHQ 2/9  Decreased Interest 3 3 2 3 3  $ Down, Depressed, Hopeless 3 3 3 2 2  $ PHQ - 2 Score 6 6 5 5 5  $ Altered sleeping 0 3 2 3 3  $ Tired, decreased energy 3 3 3 3 3  $ Change in appetite 0 1 3 0 0  Feeling bad or failure about yourself  0 0 3 2 2  $ Trouble concentrating 1 0 2 3 3  $ Moving slowly or fidgety/restless 0 3 0 2 2  Suicidal thoughts 0 0 0 0 0  PHQ-9 Score 10 16 18 18 18  $ Difficult doing work/chores Somewhat difficult Very difficult Somewhat difficult Somewhat difficult          07/25/2021   11:04 AM 09/07/2021   11:04 AM 03/05/2022    7:40 AM 09/12/2022   11:08 AM 09/14/2022    9:03 AM  Fall Risk  Falls in the past year? 1 1  0 1  Was there an injury with Fall? 0 0  0 0  Fall Risk Category Calculator 2 2  0 1  Fall Risk Category (Retired) Moderate Moderate  Low Low  (RETIRED) Patient Fall Risk Level   High fall risk Low fall risk Low fall risk  Patient at Risk for Falls Due to    No Fall Risks History of fall(s)  Fall risk Follow up Falls evaluation completed Falls evaluation completed  Falls evaluation completed Falls evaluation completed      Review of Systems  Constitutional:  Positive for fatigue. Negative for appetite change and fever.  HENT:   Negative for congestion, ear pain, sinus pressure and sore throat.   Respiratory:  Negative for cough, chest tightness, shortness of breath and wheezing.   Cardiovascular:  Positive for leg swelling. Negative for chest pain and palpitations.  Gastrointestinal:  Negative for abdominal pain, constipation, diarrhea, nausea and vomiting.  Genitourinary:  Positive for frequency. Negative for dysuria and hematuria.  Musculoskeletal:  Negative for arthralgias, back pain, joint swelling and myalgias.  Skin:  Negative for rash.  Neurological:  Positive for numbness (ends of fingers and toes). Negative for dizziness, weakness and headaches.  Psychiatric/Behavioral:  Negative for dysphoric mood. The patient is not nervous/anxious.     Current Outpatient Medications on File Prior to Visit  Medication Sig Dispense Refill   albuterol (PROVENTIL) (2.5 MG/3ML) 0.083% nebulizer solution Take 2.5 mg by nebulization every 6 (six) hours as needed for wheezing or shortness of breath.     albuterol (VENTOLIN HFA) 108 (90 Base) MCG/ACT inhaler Inhale 2 puffs into the lungs every 4 (four) hours as needed for wheezing or shortness of breath.     ALPRAZolam (  XANAX) 0.5 MG tablet Take 1 tablet (0.5 mg total) by mouth 3 (three) times daily. 90 tablet 0   Aspirin-Acetaminophen (GOODYS BODY PAIN PO) Take 1 packet by mouth daily.     blood glucose meter kit and supplies KIT Dispense based on patient and insurance preference. Use up to four times daily as directed. 1 each 0   BREO ELLIPTA 100-25 MCG/ACT AEPB Inhale 1 puff into the lungs daily.     clopidogrel (PLAVIX) 75 MG tablet Take 1 tablet (75 mg total) by mouth every morning. 90 tablet 2   ezetimibe (ZETIA) 10 MG tablet Take 1 tablet (10 mg total) by mouth daily. 90 tablet 1   fenofibrate 160 MG tablet TAKE ONE CAPSULE BY MOUTH EVERY MORNING 90 tablet 2   fluticasone (FLONASE) 50 MCG/ACT nasal spray INSTILL 1 SPRAY IN EACH NOSTRIL ONCE DAILY (Patient taking differently:  Place 1 spray into both nostrils daily as needed for rhinitis.) 16 g 1   furosemide (LASIX) 40 MG tablet Take 1 tablet (40 mg total) by mouth daily. 90 tablet 0   gabapentin (NEURONTIN) 300 MG capsule Take 1 capsule (300 mg total) by mouth 2 (two) times daily. TAKE ONE CAPSULE BY MOUTH EVERYDAY AT BEDTIME 180 capsule 1   glucose blood (ONETOUCH ULTRA) test strip DISPENSE BASED ON PATIENT AND INSURANCE PREFERENCE. USE UP TO FOUR TIMES DAILY AS DIRECTED. 400 strip 3   lamoTRIgine (LAMICTAL) 25 MG tablet Take 2 tablets (50 mg total) by mouth at bedtime. 180 tablet 1   lansoprazole (PREVACID) 30 MG capsule TAKE ONE CAPSULE BY MOUTH BEFORE BREAKFAST 90 capsule 3   Levomilnacipran HCl ER (FETZIMA) 40 MG CP24 Take 1 capsule by mouth daily. 90 capsule 1   linaclotide (LINZESS) 145 MCG CAPS capsule Take 1 capsule (145 mcg total) by mouth daily before breakfast. 12 capsule 0   lisinopril-hydrochlorothiazide (ZESTORETIC) 20-25 MG tablet Take 1 tablet by mouth daily. 30 tablet 3   montelukast (SINGULAIR) 10 MG tablet Take 10 mg by mouth daily.     naloxone (NARCAN) nasal spray 4 mg/0.1 mL SMARTSIG:1 Both Nares Daily     nitroGLYCERIN (NITROSTAT) 0.4 MG SL tablet Place 1 tablet (0.4 mg total) under the tongue every 5 (five) minutes as needed. 25 tablet 1   oxyCODONE-acetaminophen (PERCOCET) 10-325 MG tablet Take 1 tablet by mouth every 6 (six) hours as needed for pain.     potassium chloride SA (KLOR-CON M) 20 MEQ tablet Take 1 tablet (20 mEq total) by mouth daily. 90 tablet 1   rosuvastatin (CRESTOR) 20 MG tablet TAKE ONE TABLET BY MOUTH EVERYDAY AT BEDTIME 90 tablet 2   TRUEplus Lancets 30G MISC 1 each by Does not apply route 2 (two) times daily. E11.69 400 each 3   valACYclovir (VALTREX) 1000 MG tablet TAKE TWO TABLETS BY MOUTH twice A DAY as needed 20 tablet 2   VASCEPA 1 g capsule TAKE TWO CAPSULES BY MOUTH TWICE DAILY 360 capsule 1   Current Facility-Administered Medications on File Prior to Visit   Medication Dose Route Frequency Provider Last Rate Last Admin   sodium chloride flush (NS) 0.9 % injection 3 mL  3 mL Intravenous Q12H [name], [name], MD       Past Medical History:  Diagnosis Date   Anxiety    CAD (coronary artery disease)    a. NSTEMI 11/2008 s/p DES to LCx (3.0x12 Xience); b. NSTEMI 01/2010 secondary to thrombotic RCA lesion (non-obstructive)-->med rx (integrilin x 24 hrs + plavix);  c. 09/2012 negative Myoview.   Chronic diastolic CHF (congestive heart failure) ([place])    a. 06/2014 Echo: EF 55-60%, no rwma, Gr1 DD, mild AI.   Depression    Dizziness    Drug induced constipation    Ganglion cyst of left foot 01/17/2021   Generalized hyperhidrosis    GERD (gastroesophageal reflux disease)    Headache    Hyperlipidemia    Hypertensive heart disease    Lumbar disc disease    Metabolic encephalopathy    Mixed hyperlipidemia    Myocardial infarction ([place])    Obstructive sleep apnea    OP (osteoporosis)    Osteoarthritis    Osteoporosis    Other malaise    Overweight(278.02)    PAD (peripheral artery disease) ([place])    a. Emboli to R foot 2010 from partially occlusive lesion in R EIA, s/p stenting. - followed by Dr. Donnetta Hutching;  b. 10/2015 ABIs: R 1.03, L 0.97.   Restless leg    Sleep apnea    Stroke [facility])    TIA (transient ischemic attack)    Tobacco abuse    Urge incontinence    Past Surgical History:  Procedure Laterality Date   CYST EXCISION Left 01/2021   left foot   EYE SURGERY     at age 12   hysterectomy -age 13     ILIAC ARTERY STENT     RIGHT ILIAC STENT   KNEE ARTHROSCOPY     LEFT HEART CATH AND CORONARY ANGIOGRAPHY N/A 03/05/2022   Procedure: LEFT HEART CATH AND CORONARY ANGIOGRAPHY;  Surgeon: [name], MD;  Location: [facility];  Service: Cardiovascular;  Laterality: N/A;   LITHOTRIPSY Left 12/2020   LUMBAR LAMINECTOMY     TUBAL LIGATION      Family History  Problem Relation Age of Onset   Alzheimer's disease Mother     Hodgkin's lymphoma Brother    Lung cancer Brother    Hepatitis C Brother    Hypothyroidism Brother    Hypertension Brother    Depression Brother    Heart disease Other        Grandfather   Social History   Socioeconomic History   Marital status: Divorced    Spouse name: Not on file   Number of children: 3   Years of education: 10 th   Highest education level: Not on file  Occupational History   Occupation: DISABLED    Employer: UNEMPLOYED  Tobacco Use   Smoking status: Every Day    Packs/day: 2.00    Years: 54.00    Total pack years: 108.00    Types: Cigarettes    Last attempt to quit: 11/20/2012    Years since quitting: 9.9   Smokeless tobacco: Never   Tobacco comments:    2 ppd for many years.     04/13/2022 Patient smokes 1/2 pack daily or more if she is nervous  Substance and Sexual Activity   Alcohol use: No    Alcohol/week: 0.0 standard drinks of alcohol   Drug use: No   Sexual activity: Never  Other Topics Concern   Not on file  Social History Narrative   Patient is single with 3 children, 1 deceased.   Patient is right handed.   Patient has 10 th grade education.   Patient drinks 5 or more cups daily.   Social Determinants of Health   Financial Resource Strain: Low Risk  (09/14/2022)   Overall Financial Resource Strain (CARDIA)  Difficulty of Paying Living Expenses: Not hard at all  Food Insecurity: No Food Insecurity (09/14/2022)   Hunger Vital Sign    Worried About Running Out of Food in the Last Year: Never true    Ran Out of Food in the Last Year: Never true  Transportation Needs: No Transportation Needs (09/14/2022)   PRAPARE - Hydrologist (Medical): No    Lack of Transportation (Non-Medical): No  Physical Activity: Inactive (09/14/2022)   Exercise Vital Sign    Days of Exercise per Week: 0 days    Minutes of Exercise per Session: 0 min  Stress: Stress Concern Present (09/14/2022)   [place]    Feeling of Stress : Rather much  Social Connections: Moderately Isolated (09/14/2022)   Social Connection and Isolation Panel [NHANES]    Frequency of Communication with Friends and Family: More than three times a week    Frequency of Social Gatherings with Friends and Family: Never    Attends Religious Services: More than 4 times per year    Active Member of [employer] or Organizations: No    Attends Music therapist: Never    Marital Status: Divorced    Objective:  BP 118/68 (BP Location: Left Arm, Patient Position: Sitting)   Pulse 100   Temp 97.8 F (36.6 C) (Temporal)   Ht 5' 5"$  (1.651 m)   Wt 155 lb 12.8 oz (70.7 kg)   SpO2 97%   BMI 25.93 kg/m      10/26/2022   11:32 AM 09/14/2022    8:50 AM 09/12/2022   11:11 AM  BP/Weight  Systolic BP 123456 [id number] 123456  Diastolic BP 68 60 72  Wt. (Lbs) 155.8 151 151  BMI 25.93 kg/m2 25.13 kg/m2 25.13 kg/m2    Physical Exam Vitals reviewed.  Constitutional:      Appearance: Normal appearance. She is normal weight.  Cardiovascular:     Rate and Rhythm: Normal rate and regular rhythm.     Heart sounds: Normal heart sounds.  Pulmonary:     Effort: Pulmonary effort is normal.     Breath sounds: Normal breath sounds.  Abdominal:     General: Abdomen is flat. Bowel sounds are normal.     Palpations: Abdomen is soft.     Tenderness: There is no abdominal tenderness.  Neurological:     Mental Status: She is alert and oriented to person, place, and time.  Psychiatric:        Mood and Affect: Mood normal.        Behavior: Behavior normal.     Diabetic Foot Exam - Simple   No data filed      Lab Results  Component Value Date   WBC 11.5 (H) 10/26/2022   HGB 14.8 10/26/2022   HCT 46.3 10/26/2022   PLT 287 10/26/2022   GLUCOSE 408 (H) 10/26/2022   CHOL 113 09/12/2022   TRIG 113 09/12/2022   HDL 40 09/12/2022   LDLCALC 52 09/12/2022   ALT 18 10/26/2022   AST  18 10/26/2022   NA 138 10/26/2022   K 3.6 10/26/2022   CL 96 10/26/2022   CREATININE 0.85 10/26/2022   BUN 32 (H) 10/26/2022   CO2 23 10/26/2022   TSH 0.605 09/12/2022   INR 1.0 01/31/2019   HGBA1C 7.9 (H) 09/12/2022   MICROALBUR neg 02/27/2021      Assessment & Plan:    Diabetic polyneuropathy associated  with type 2 diabetes mellitus [facility]) Assessment & Plan: Stop Trulicity. Increase Farxiga to 10 mg daily.  Start on mounjaro 2.5 mg shot weekly.  Orders: -     Tirzepatide; Inject 2.5 mg into the skin once a week.  Dispense: 2 mL; Refill: 0 -     Dapagliflozin Propanediol; Take 1 tablet (10 mg total) by mouth daily.  Dispense: 30 tablet; Refill: 2  COPD mixed type [facility]) Assessment & Plan: The current medical regimen is effective;  continue present plan and medications.  Taking Breo and Albuterol inhaler.   Other fatigue Assessment & Plan: Check labs  Orders: -     CBC with Differential/Platelet -     Comprehensive metabolic panel  Paresthesias Assessment & Plan: Check labs.  Orders: -     CBC with Differential/Platelet -     Comprehensive metabolic panel -     123456 and Folate Panel -     Methylmalonic acid, serum  Polyuria Assessment & Plan: Ordered UA.  Orders: -     POCT URINALYSIS DIP (CLINITEK)     Meds ordered this encounter  Medications   DISCONTD: Dulaglutide (TRULICITY) 4.5 0000000 SOPN    Sig: Inject 4.5 mg as directed once a week.    Dispense:  2 mL    Refill:  1   tirzepatide (MOUNJARO) 2.5 MG/0.5ML Pen    Sig: Inject 2.5 mg into the skin once a week.    Dispense:  2 mL    Refill:  0   dapagliflozin propanediol (FARXIGA) 10 MG TABS tablet    Sig: Take 1 tablet (10 mg total) by mouth daily.    Dispense:  30 tablet    Refill:  2    Orders Placed This Encounter  Procedures   CBC with Differential/Platelet   Comprehensive metabolic panel   123456 and Folate Panel   Methylmalonic acid, serum   POCT URINALYSIS DIP (CLINITEK)      Follow-up: Return in about 6 weeks (around 12/06/2022) for chronic fasting.  An After Visit Summary was printed and given to the patient.  I,Lauren M Auman,acting as a scribe for [name], MD.,have documented all relevant documentation on the behalf of [name], MD,as directed by  [name], MD while in the presence of [name], MD.   I attest that I have reviewed this visit and agree with the plan scribed by my staff.   [name], MD [facility] [phone]

## 2022-10-26 NOTE — Assessment & Plan Note (Signed)
Well controlled.  No changes to medicines. Rosuvastatin and zetia. Continue to work on eating a healthy diet and exercise.  Labs drawn today.

## 2022-10-26 NOTE — Assessment & Plan Note (Signed)
The current medical regimen is effective;  continue present plan and medications.  Taking Breo and Albuterol inhaler.

## 2022-10-26 NOTE — Progress Notes (Signed)
   Outreach Note  10/26/2022 Name: Sonya Small MRN: [id number] DOB: 1951/03/13  Referred by: [name], MD Reason for referral : No chief complaint on file.   Outreach to patient today for initial appointment  Was unable to reach patient via telephone today and have left HIPAA compliant voicemail asking patient to return my call.   Receive phone call back from patient who reports that she is getting ready to go to an appointment with her PCP and requests to reschedule our appointment for another day.   Follow Up Plan: As requested, reschedule appointment with patient for 11/07/2022 at 9 am  Wallace Cullens, PharmD, [facility] [phone]

## 2022-10-26 NOTE — Assessment & Plan Note (Signed)
Stop Trulicity. Increase Farxiga to 10 mg daily.  Start on mounjaro 2.5 mg shot weekly.

## 2022-10-26 NOTE — Assessment & Plan Note (Signed)
Check labs 

## 2022-10-26 NOTE — Assessment & Plan Note (Signed)
Ordered UA

## 2022-10-26 NOTE — Patient Instructions (Signed)
Increase Farxiga to 10 mg daily.   Start on mounjaro 2.5 mg shot weekly (I sent to the pharmacy, but I many need to do a Prior authorization.

## 2022-10-31 ENCOUNTER — Telehealth: Payer: Self-pay

## 2022-10-31 NOTE — Telephone Encounter (Signed)
Prior authorization was done through patient's insurance for [facility] and was approved through 09/23/2098 per insurance.

## 2022-11-01 LAB — COMPREHENSIVE METABOLIC PANEL
ALT: 18 IU/L (ref 0–32)
AST: 18 IU/L (ref 0–40)
Albumin/Globulin Ratio: 1.8 (ref 1.2–2.2)
Albumin: 4.4 g/dL (ref 3.8–4.8)
Alkaline Phosphatase: 88 IU/L (ref 44–121)
BUN/Creatinine Ratio: 38 — ABNORMAL HIGH (ref 12–28)
BUN: 32 mg/dL — ABNORMAL HIGH (ref 8–27)
Bilirubin Total: 0.2 mg/dL (ref 0.0–1.2)
CO2: 23 mmol/L (ref 20–29)
Calcium: 9.5 mg/dL (ref 8.7–10.3)
Chloride: 96 mmol/L (ref 96–106)
Creatinine, Ser: 0.85 mg/dL (ref 0.57–1.00)
Globulin, Total: 2.4 g/dL (ref 1.5–4.5)
Glucose: 408 mg/dL — ABNORMAL HIGH (ref 70–99)
Potassium: 3.6 mmol/L (ref 3.5–5.2)
Sodium: 138 mmol/L (ref 134–144)
Total Protein: 6.8 g/dL (ref 6.0–8.5)
eGFR: 72 mL/min/{1.73_m2} (ref >59)

## 2022-11-01 LAB — CBC WITH DIFFERENTIAL/PLATELET
Basophils Absolute: 0.1 10*3/uL (ref 0.0–0.2)
Basos: 0 %
EOS (ABSOLUTE): 0.1 10*3/uL (ref 0.0–0.4)
Eos: 1 %
Hematocrit: 46.3 % (ref 34.0–46.6)
Hemoglobin: 14.8 g/dL (ref 11.1–15.9)
Immature Grans (Abs): 0.1 10*3/uL (ref 0.0–0.1)
Immature Granulocytes: 1 %
Lymphocytes Absolute: 2.2 10*3/uL (ref 0.7–3.1)
Lymphs: 19 %
MCH: 29.1 pg (ref 26.6–33.0)
MCHC: 32 g/dL (ref 31.5–35.7)
MCV: 91 fL (ref 79–97)
Monocytes Absolute: 0.8 10*3/uL (ref 0.1–0.9)
Monocytes: 7 %
Neutrophils Absolute: 8.3 10*3/uL — ABNORMAL HIGH (ref 1.4–7.0)
Neutrophils: 72 %
Platelets: 287 10*3/uL (ref 150–450)
RBC: 5.09 x10E6/uL (ref 3.77–5.28)
RDW: 12 % (ref 11.7–15.4)
WBC: 11.5 10*3/uL — ABNORMAL HIGH (ref 3.4–10.8)

## 2022-11-01 LAB — B12 AND FOLATE PANEL
Folate: 14.1 ng/mL (ref >3.0)
Vitamin B-12: 408 pg/mL (ref 232–1245)

## 2022-11-01 LAB — METHYLMALONIC ACID, SERUM: Methylmalonic Acid: 384 nmol/L — ABNORMAL HIGH (ref 0–378)

## 2022-11-02 NOTE — Progress Notes (Signed)
Blood count abnormal. Wbc up a little.  Liver function normal.  Kidney function normal.  Glucose 408. I adjusted her medicines at her appt.  Mild b12 deficiency. Recommend b12 1000 mcg once daily

## 2022-11-07 ENCOUNTER — Other Ambulatory Visit: Payer: Medicare Other | Admitting: Pharmacist

## 2022-11-07 NOTE — Patient Instructions (Signed)
Goals Addressed             This Visit's Progress    Pharmacy Goals       Please have your medications as well as your blood sugar and blood pressure readings with you for our next telephone call on 11/14/2022 at 9 am.  Thank you!  Wallace Cullens, PharmD, [facility] [phone]

## 2022-11-07 NOTE — Progress Notes (Signed)
   11/07/2022  Patient ID: Sonya Small, female   DOB: September 17, 1951, 72 y.o.   MRN: [id number]   Outreach to patient today for initial appointment as previously rescheduled from 10/26/2022.  Today patient reports that today is not a good time to talk as she is busy this morning, but expresses interest in speaking with clinical pharmacist a different day.  Prior to hanging up, patient mentions that she recently had a fall out of her bed. Reports she is stiff following this fall, but denies other injury. States fall may have been related to chronic dizziness and neuropathy in her feet. Patient confirms that she will call her PCP today regarding her recent fall and concern about her chronic dizziness.  Follow Up Plan: As requested, reschedule appointment with patient for 11/14/2022 at 9 am   Wallace Cullens, PharmD, [facility] [place] [phone]

## 2022-11-14 ENCOUNTER — Other Ambulatory Visit: Payer: Medicare Other | Admitting: Pharmacist

## 2022-11-14 NOTE — Patient Instructions (Signed)
Goals Addressed             This Visit's Progress    Pharmacy Goals       Our goal A1c is less than 7%. This corresponds with fasting sugars less than 130 and 2 hour after meal sugars less than 180. Please keep a log of your results when checking your blood sugar   Our goal bad cholesterol, or LDL, is less than 70 . This is why it is important to continue taking your rosuvastatin and ezetimibe   Thank you!  Wallace Cullens, PharmD, [facility] [phone]

## 2022-11-14 NOTE — Progress Notes (Signed)
11/14/2022 Name: Sonya Small MRN: [id number] DOB: 1951/02/25  Chief Complaint  Patient presents with   Medication Management    Sonya Small is a 72 y.o. year old female who presented for a telephone visit.   They were referred to the pharmacist by their PCP for assistance in managing diabetes and complex medication management.    Subjective:  Care Team: Primary Care Provider: [name], MD ; Next Scheduled Visit: 12/06/2022 Pulmonologist: Barnie Mort, NP Orthopedic Specialist: Karenann Cai, PA-C Cardiologist: [name], [name], MD  Medication Access/Adherence  Current Pharmacy:  [facility] - [place], [place] - [address] [place] [address] [place] [address] Phone: [phone] Fax: [phone]  [id number] - [address] [place] [address] Phone: [phone] Fax: [phone]  [id number] [id number] [id number]: [phone]: [phone]  Patient reports affordability concerns with their medications: No  Patient reports access/transportation concerns to their pharmacy: No  Patient reports adherence concerns with their medications:  No    Reports uses pill pack adherence packaging from [facility]   Diabetes:  Current medications:  Farxiga 10 mg daily (increased by PCP on [id number] Trulicity 4.5 mg weekly  [facility] because was able to afford to again [facility]  Reports blood sugar readings have significantly improved since started back on Trulicity and increased Farxiga dose  Current glucose readings:   Morning Fasting  15 - February 178  16 - February 192  17 - February 173  18 - February 179  19 - February 158  20 - February 156  21 - February 141     Patient denies hypoglycemic s/sx including dizziness, shakiness, sweating.   Current meal patterns:  - Breakfast: instant grits or oatmeal, pork brains and eggs - Lunch: can of country style chicken noodle soup - Supper: recently skipping this meal  - Snacks: none - Drinks: diet M[place]gateand sometimes sweet tea and water  Current physical activity: Limited to movement around the home, including up and down stairs and caring for her dog throughout the day  Current medication access support: none   Hypertension:  Current medications:  lisinopril-HCTZ 20-25 mg daily Furosemide 40 mg daily  Medications previously tried: amlodipine  Patient does not have a validated, automated, upper arm home BP cuff  Reports limits salt with cooking/seasoning  Note patient with sleep apnea. Reports previously on CPAP, but unable to tolerate -Admits to chronic daytime sleepiness  Current physical activity: Limited to movement around the home, including up and down stairs and caring for her dog throughout the day   Hyperlipidemia/ASCVD Risk Reduction  Current lipid lowering medications:  rosuvastatin 20 mg daily ezetimibe 10 mg daily fenofibrate 160 mg QAM Vascepa 1 gram - 2 capsules twice daily  Antiplatelet regimen: clopidogrel 75 mg daily  ASCVD History: CAD, history of NSTEMI; PAD; history of CVA  Current physical activity: Limited to movement around the home, including up and down stairs and caring for her dog throughout the day    COPD/Asthma:  Current medications: Trelegy 200/62.5/25 mcg - 1 puff daily Albuterol inhaler/albuterol nebulizer solution as needed for wheezing/shortness of breath Montelukast 10 mg daily  Medications tried in the past: Breo (reports Pulmonologist recently switched her from  this to Trelegy)  Confirms rinses and spits out after each use of Trelegy   Tobacco Use: Smokes ~1/2 pack/day Reports amount that she smokes depends on  stress Denies interest in quitting at this time   Objective:  Lab Results  Component Value Date   HGBA1C 7.9 (H) 09/12/2022    Lab Results  Component Value Date   CREATININE 0.85 10/26/2022   BUN 32 (H) 10/26/2022   NA 138 10/26/2022   K 3.6 10/26/2022   CL 96 10/26/2022   CO2 23 10/26/2022    Lab Results  Component Value Date   CHOL 113 09/12/2022   HDL 40 09/12/2022   LDLCALC 52 09/12/2022   TRIG 113 09/12/2022   CHOLHDL 2.8 09/12/2022   BP Readings from Last 3 Encounters:  10/26/22 118/68  09/14/22 114/60  09/12/22 118/72   Pulse Readings from Last 3 Encounters:  10/26/22 100  09/14/22 95  09/12/22 97     Medications Reviewed Today     Reviewed by Rennis Petty, RPH-CPP (Pharmacist) on 11/14/22 at [phone]  Med List Status: <None>   Medication Order Taking? Sig Documenting Provider Last Dose Status Informant  albuterol (PROVENTIL) (2.5 MG/3ML) 0.083% nebulizer solution [id number] Yes Take 2.5 mg by nebulization every 6 (six) hours as needed for wheezing or shortness of breath. [provider] Taking Active Self  albuterol (VENTOLIN HFA) 108 (90 Base) MCG/ACT inhaler [id number] Yes Inhale 2 puffs into the lungs every 4 (four) hours as needed for wheezing or shortness of breath. [provider] Taking Active Self  ALPRAZolam Duanne Moron) 0.5 MG tablet [id number] Yes Take 1 tablet (0.5 mg total) by mouth 3 (three) times daily.  Patient taking differently: Take 0.5 mg by mouth 3 (three) times daily as needed.   [name], MD Taking Active   Aspirin-Acetaminophen (GOODYS BODY PAIN PO) [id number]  Take 1 packet by mouth daily. [provider]  Active Self  blood glucose meter kit and supplies KIT [id number]  Dispense based on patient and insurance preference. Use up to four times daily as directed. [name], [name], MD  Active   BREO ELLIPTA 100-25 MCG/ACT AEPB [id number] No Inhale 1 puff into the lungs daily.  Patient not taking: Reported on  11/14/2022   [provider] Not Taking Active Self  clopidogrel (PLAVIX) 75 MG tablet [id number] Yes Take 1 tablet (75 mg total) by mouth every morning. [name], [name], MD Taking Active   dapagliflozin propanediol (FARXIGA) 10 MG TABS tablet [id number] Yes Take 1 tablet (10 mg total) by mouth daily. [name], [name], MD Taking Active   docusate sodium (COLACE) 100 MG capsule [id number] Yes Take 100 mg by mouth 2 (two) times daily as needed for mild constipation. [provider] Taking Active   ezetimibe (ZETIA) 10 MG tablet [id number] Yes Take 1 tablet (10 mg total) by mouth daily. [name], MD Taking Active   fenofibrate 160 MG tablet [id number] Yes TAKE ONE CAPSULE BY MOUTH EVERY MORNING [name], [name], MD Taking Active   fluticasone (FLONASE) 50 MCG/ACT nasal spray [id number] Yes INSTILL 1 SPRAY IN EACH NOSTRIL ONCE DAILY  Patient taking differently: Place 1 spray into both nostrils daily as needed for rhinitis.   [name], [name], MD Taking Active Self  furosemide (LASIX) 40 MG tablet [id number] Yes Take 1 tablet (40 mg total) by mouth daily. [name], [name], MD Taking Active   gabapentin (NEURONTIN) 300 MG capsule [id number] Yes Take 1 capsule (300 mg total) by mouth 2 (two) times daily. TAKE ONE  CAPSULE BY MOUTH EVERYDAY AT BEDTIME [name], [name], MD Taking Active   glucose blood (ONETOUCH ULTRA) test strip [id number]  DISPENSE BASED ON PATIENT AND INSURANCE PREFERENCE. USE UP TO FOUR TIMES DAILY AS DIRECTED. [name], [name], MD  Active   lamoTRIgine (LAMICTAL) 25 MG tablet [id number] Yes Take 2 tablets (50 mg total) by mouth at bedtime. [name], MD Taking Active   lansoprazole (PREVACID) 30 MG capsule [id number] Yes TAKE ONE CAPSULE BY MOUTH BEFORE Alexis Frock, PA-C Taking Active   Levomilnacipran HCl ER (FETZIMA) 40 MG CP24 [id number] Yes Take 1 capsule by mouth daily. [name], [name], MD Taking Active   lisinopril-hydrochlorothiazide (ZESTORETIC) 20-25 MG tablet [id number] Yes Take 1 tablet  by mouth daily. [name], [name], MD Taking Active   montelukast (SINGULAIR) 10 MG tablet [id number] Yes Take 10 mg by mouth daily. [provider] Taking Active Self  naloxone [facility]) nasal spray 4 mg/0.1 mL [id number]  SMARTSIG:1 Both Nares Daily [provider]  Active   nitroGLYCERIN (NITROSTAT) 0.4 MG SL tablet [id number]  Place 1 tablet (0.4 mg total) under the tongue every 5 (five) minutes as needed. [name], [name], MD  Active   oxyCODONE-acetaminophen [facility]) 10-325 MG tablet [id number] Yes Take 1 tablet by mouth every 6 (six) hours as needed for pain. [provider] Taking Active Self  potassium chloride SA (KLOR-CON M) 20 MEQ tablet [id number] Yes Take 1 tablet (20 mEq total) by mouth daily. [name], [name], MD Taking Active   rosuvastatin (CRESTOR) 20 MG tablet [id number] Yes TAKE ONE TABLET BY MOUTH EVERYDAY AT BEDTIME [name], [name], MD Taking Active   sodium chloride flush (NS) 0.9 % injection 3 mL [id number]   [name], [name], MD  Active   TRUEplus Lancets 30G [place] [id number]  1 each by Does not apply route 2 (two) times daily. E11.69 [name], MD  Active   TRULICITY 4.5 0000000 Bonney Aid [id number] Yes Inject 4.5 mg into the skin once a week. [provider] Taking Active   valACYclovir (VALTREX) 1000 MG tablet [id number]  TAKE TWO TABLETS BY MOUTH twice A DAY as needed [name], [name], MD  Active   VASCEPA 1 g capsule [id number] Yes TAKE TWO CAPSULES BY MOUTH TWICE DAILY [name], [name], MD Taking Active               Assessment/Plan:   Comprehensive medication review performed; medication list updated in electronic medical record - Reports trialed Linzess in the past, but decided not to continue due to diarrhea  - Caution patient for increased risk of dizziness/sedation with alprazolam, lamotrigine and Percocet, particularly when taken in combination  Reports takes alprazolam and Percocet only as needed  Encourage patient to sit or lay down after taking  Percocet doses  Diabetes: - Currently uncontrolled - Reviewed long term cardiovascular and renal outcomes of uncontrolled blood sugar - Reviewed goal A1c, goal fasting, and goal 2 hour post prandial glucose - Reviewed dietary modifications including: Having regular well-balanced meals throughout the day, including supper, while limiting carbohydrate portion sizes  Encourage patient to review nutrition labels for total carbohydrate content of foods - Recommend to follow up with [facility] to request pharmacy include Farxiga in her pill packaging for next refill  - Recommend to continue to check glucose, keep log of results and have this record to review at upcoming medical appointments   Hypertension: - Currently controlled - Encourage patient to review nutrition labels for sodium content of foods - Encourage patient to consider obtaining upper arm  blood pressure monitor in the future if able to allow for home BP monitoring   Hyperlipidemia/ASCVD Risk Reduction: - Currently controlled.  - Reviewed dietary recommendations including limiting saturated fats, selecting reduced fat dairy products   COPD/Asthma: - Reviewed appropriate inhaler technique. - Recommend to follow up with Pulmonologist regarding options for management of OSA as reports unable to tolerate CPAP    Follow Up Plan: Clinical Pharmacist will follow up with patient by telephone on 01/02/2023 at 9:00 AM   Wallace Cullens, PharmD, [place] [phone]

## 2022-11-16 ENCOUNTER — Other Ambulatory Visit: Payer: Self-pay | Admitting: Family Medicine

## 2022-11-16 DIAGNOSIS — I119 Hypertensive heart disease without heart failure: Secondary | ICD-10-CM

## 2022-11-18 ENCOUNTER — Other Ambulatory Visit: Payer: Self-pay | Admitting: Family Medicine

## 2022-11-19 ENCOUNTER — Other Ambulatory Visit: Payer: Self-pay

## 2022-11-19 MED ORDER — TRULICITY 4.5 MG/0.5ML ~~LOC~~ SOAJ: 4.5000 mg | SUBCUTANEOUS | 2 refills | Status: DC

## 2022-11-20 ENCOUNTER — Other Ambulatory Visit: Payer: Self-pay

## 2022-11-20 DIAGNOSIS — E1142 Type 2 diabetes mellitus with diabetic polyneuropathy: Secondary | ICD-10-CM

## 2022-11-20 MED ORDER — TRUEPLUS LANCETS 30G MISC: 1.0000 | Freq: Two times a day (BID) | 3 refills | Status: DC

## 2022-11-20 MED ORDER — ONETOUCH ULTRA VI STRP: ORAL_STRIP | 3 refills | Status: DC

## 2022-12-06 ENCOUNTER — Other Ambulatory Visit: Payer: Self-pay

## 2022-12-06 ENCOUNTER — Other Ambulatory Visit: Payer: Self-pay | Admitting: Family Medicine

## 2022-12-06 ENCOUNTER — Ambulatory Visit (INDEPENDENT_AMBULATORY_CARE_PROVIDER_SITE_OTHER): Payer: Medicare Other | Admitting: Family Medicine

## 2022-12-06 VITALS — BP 118/64 | HR 83 | Temp 97.2°F | Ht 65.0 in | Wt 155.0 lb

## 2022-12-06 DIAGNOSIS — Z780 Asymptomatic menopausal state: Secondary | ICD-10-CM

## 2022-12-06 DIAGNOSIS — J449 Chronic obstructive pulmonary disease, unspecified: Secondary | ICD-10-CM

## 2022-12-06 DIAGNOSIS — J320 Chronic maxillary sinusitis: Secondary | ICD-10-CM

## 2022-12-06 DIAGNOSIS — I5042 Chronic combined systolic (congestive) and diastolic (congestive) heart failure: Secondary | ICD-10-CM

## 2022-12-06 DIAGNOSIS — E1142 Type 2 diabetes mellitus with diabetic polyneuropathy: Secondary | ICD-10-CM

## 2022-12-06 DIAGNOSIS — I2510 Atherosclerotic heart disease of native coronary artery without angina pectoris: Secondary | ICD-10-CM | POA: Diagnosis not present

## 2022-12-06 DIAGNOSIS — I110 Hypertensive heart disease with heart failure: Secondary | ICD-10-CM | POA: Diagnosis not present

## 2022-12-06 DIAGNOSIS — E782 Mixed hyperlipidemia: Secondary | ICD-10-CM

## 2022-12-06 DIAGNOSIS — F331 Major depressive disorder, recurrent, moderate: Secondary | ICD-10-CM

## 2022-12-06 DIAGNOSIS — D352 Benign neoplasm of pituitary gland: Secondary | ICD-10-CM

## 2022-12-06 DIAGNOSIS — Z13820 Encounter for screening for osteoporosis: Secondary | ICD-10-CM

## 2022-12-06 DIAGNOSIS — N3946 Mixed incontinence: Secondary | ICD-10-CM

## 2022-12-06 MED ORDER — FETZIMA 80 MG PO CP24: 80.0000 mg | ORAL_CAPSULE | Freq: Every day | ORAL | 3 refills | Status: DC

## 2022-12-06 MED ORDER — SULFAMETHOXAZOLE-TRIMETHOPRIM 800-160 MG PO TABS: 1.0000 | ORAL_TABLET | Freq: Two times a day (BID) | ORAL | 0 refills | Status: DC

## 2022-12-06 NOTE — Progress Notes (Signed)
Subjective:  Patient ID: Sonya Small, female    DOB: 04-Jan-1951  Age: 72 y.o. MRN: [id number]  Chief Complaint  Patient presents with   Diabetes   Hyperlipidemia   Hypertension   COPD   Depression    HPI Diabetes:  Complications: neuropathy Glucose checking: daily fasting Glucose logs: 130-190 Hypoglycemia: no Most recent A1C: 7.9. Current medications: trulicity 3 mg weekly, farxiga 10 mg neurontin 300 mg before bed.  Last Eye Exam: 09/04/2022. Foot checks: daily.  Breakfast 5:30 am, Supper 2 pm Does not eat anymore.    Hyperlipidemia: Current medications: on fenofibrate 160 mg daily, rosuvastatin 20 mg daily. Vascepa 1 gm 2 capsules twice daily, and zetia 10 mg daily.     Hypertension: Complications: CORONARY ARTERY DISEASE. On plavix 75 mg daily.  Current medications: zestoretic 20/25 mg daily, lasix 20 mg daily.    COPD: on breo 100/25 mcg one inhalation daily, Albuterol hfa or nebs.   Depression: on fetzima 40 mg once daily, Lamictal 25 mg 2 before bed, xanax 0.5 mg three times a day.     09/14/2022    9:02 AM 08/28/2022    1:56 PM 01/24/2022    9:16 AM 10/18/2021   10:44 PM 09/07/2021   11:05 AM  Depression screen PHQ 2/9  Decreased Interest 3 3 2 3 3   Down, Depressed, Hopeless 3 3 3 2 2   PHQ - 2 Score 6 6 5 5 5   Altered sleeping 0 3 2 3 3   Tired, decreased energy 3 3 3 3 3   Change in appetite 0 1 3 0 0  Feeling bad or failure about yourself  0 0 3 2 2   Trouble concentrating 1 0 2 3 3   Moving slowly or fidgety/restless 0 3 0 2 2  Suicidal thoughts 0 0 0 0 0  PHQ-9 Score 10 16 18 18 18   Difficult doing work/chores Somewhat difficult Very difficult Somewhat difficult Somewhat difficult          09/07/2021   11:04 AM 03/05/2022    7:40 AM 09/12/2022   11:08 AM 09/14/2022    9:03 AM 12/06/2022   10:43 AM  Fall Risk  Falls in the past year? 1  0 1 0  Was there an injury with Fall? 0  0 0 0  Fall Risk Category Calculator 2  0 1 0  Fall Risk Category  (Retired) Moderate  Low Low   (RETIRED) Patient Fall Risk Level  High fall risk Low fall risk Low fall risk   Patient at Risk for Falls Due to   No Fall Risks History of fall(s) No Fall Risks  Fall risk Follow up Falls evaluation completed  Falls evaluation completed Falls evaluation completed Falls evaluation completed      Review of Systems  Constitutional:  Positive for fatigue. Negative for chills and fever.  HENT:  Negative for congestion, ear pain, rhinorrhea and sore throat.   Respiratory:  Positive for cough and shortness of breath.   Cardiovascular:  Negative for chest pain.  Gastrointestinal:  Positive for constipation and nausea. Negative for abdominal pain, diarrhea and vomiting.  Endocrine: Positive for polydipsia and polyuria. Negative for polyphagia.  Genitourinary:  Positive for dysuria. Negative for urgency.       Urge incontinence  Musculoskeletal:  Positive for back pain. Negative for myalgias.  Neurological:  Positive for weakness. Negative for dizziness, light-headedness and headaches.  Psychiatric/Behavioral:  Negative for dysphoric mood. The patient is not nervous/anxious.  Current Outpatient Medications on File Prior to Visit  Medication Sig Dispense Refill   albuterol (PROVENTIL) (2.5 MG/3ML) 0.083% nebulizer solution Take 2.5 mg by nebulization every 6 (six) hours as needed for wheezing or shortness of breath.     albuterol (VENTOLIN HFA) 108 (90 Base) MCG/ACT inhaler Inhale 2 puffs into the lungs every 4 (four) hours as needed for wheezing or shortness of breath.     ALPRAZolam (XANAX) 0.5 MG tablet Take 1 tablet (0.5 mg total) by mouth 3 (three) times daily. (Patient taking differently: Take 0.5 mg by mouth 3 (three) times daily as needed.) 90 tablet 0   Aspirin-Acetaminophen (GOODYS BODY PAIN PO) Take 1 packet by mouth daily.     blood glucose meter kit and supplies KIT Dispense based on patient and insurance preference. Use up to four times daily as  directed. 1 each 0   dapagliflozin propanediol (FARXIGA) 10 MG TABS tablet Take 1 tablet (10 mg total) by mouth daily. 30 tablet 2   docusate sodium (COLACE) 100 MG capsule Take 100 mg by mouth 2 (two) times daily as needed for mild constipation.     fluticasone (FLONASE) 50 MCG/ACT nasal spray INSTILL 1 SPRAY IN EACH NOSTRIL ONCE DAILY (Patient taking differently: Place 1 spray into both nostrils daily as needed for rhinitis.) 16 g 1   furosemide (LASIX) 40 MG tablet Take 1 tablet (40 mg total) by mouth daily. 90 tablet 0   gabapentin (NEURONTIN) 300 MG capsule Take 1 capsule (300 mg total) by mouth 2 (two) times daily. TAKE ONE CAPSULE BY MOUTH EVERYDAY AT BEDTIME 180 capsule 1   glucose blood (ONETOUCH ULTRA) test strip DISPENSE BASED ON PATIENT AND INSURANCE PREFERENCE. USE UP TO FOUR TIMES DAILY AS DIRECTED. 400 strip 3   lamoTRIgine (LAMICTAL) 25 MG tablet Take 2 tablets (50 mg total) by mouth at bedtime. 180 tablet 1   lansoprazole (PREVACID) 30 MG capsule TAKE ONE CAPSULE BY MOUTH BEFORE BREAKFAST 90 capsule 3   lisinopril-hydrochlorothiazide (ZESTORETIC) 20-25 MG tablet TAKE ONE TABLET BY MOUTH ONCE DAILY 30 tablet 3   montelukast (SINGULAIR) 10 MG tablet Take 10 mg by mouth daily.     naloxone (NARCAN) nasal spray 4 mg/0.1 mL SMARTSIG:1 Both Nares Daily     nitroGLYCERIN (NITROSTAT) 0.4 MG SL tablet Place 1 tablet (0.4 mg total) under the tongue every 5 (five) minutes as needed. 25 tablet 1   oxyCODONE-acetaminophen (PERCOCET) 10-325 MG tablet Take 1 tablet by mouth every 6 (six) hours as needed for pain.     potassium chloride SA (KLOR-CON M) 20 MEQ tablet Take 1 tablet (20 mEq total) by mouth daily. 90 tablet 1   TRELEGY ELLIPTA 200-62.5-25 MCG/ACT AEPB Inhale 1 puff into the lungs daily.     TRUEplus Lancets 30G MISC 1 each by Does not apply route 2 (two) times daily. E11.69 [id number] each 3   TRULICITY 4.5 0000000 SOPN Inject 4.5 mg into the skin once a week. 2 mL 2   valACYclovir  (VALTREX) 1000 MG tablet TAKE TWO TABLETS BY MOUTH twice A DAY as needed 20 tablet 2   VASCEPA 1 g capsule TAKE TWO CAPSULES BY MOUTH TWICE DAILY 360 capsule 1   Current Facility-Administered Medications on File Prior to Visit  Medication Dose Route Frequency Provider Last Rate Last Admin   sodium chloride flush (NS) 0.9 % injection 3 mL  3 mL Intravenous Q12H [name], [name], MD       Past Medical History:  Diagnosis  Date   Anxiety    CAD (coronary artery disease)    a. NSTEMI 11/2008 s/p DES to LCx (3.0x12 Xience); b. NSTEMI 01/2010 secondary to thrombotic RCA lesion (non-obstructive)-->med rx (integrilin x 24 hrs + plavix); c. 09/2012 negative Myoview.   Chronic diastolic CHF (congestive heart failure) ([place])    a. 06/2014 Echo: EF 55-60%, no rwma, Gr1 DD, mild AI.   Depression    Dizziness    Drug induced constipation    Ganglion cyst of left foot 01/17/2021   Generalized hyperhidrosis    GERD (gastroesophageal reflux disease)    Headache    Hyperlipidemia    Hypertensive heart disease    Lumbar disc disease    Metabolic encephalopathy    Mixed hyperlipidemia    Myocardial infarction ([place])    Obstructive sleep apnea    OP (osteoporosis)    Osteoarthritis    Osteoporosis    Other malaise    Overweight(278.02)    PAD (peripheral artery disease) ([place])    a. Emboli to R foot 2010 from partially occlusive lesion in R EIA, s/p stenting. - followed by Dr. Donnetta Hutching;  b. 10/2015 ABIs: R 1.03, L 0.97.   Restless leg    Sleep apnea    Stroke [facility])    TIA (transient ischemic attack)    Tobacco abuse    Urge incontinence    Past Surgical History:  Procedure Laterality Date   CYST EXCISION Left 01/2021   left foot   EYE SURGERY     at age 27   hysterectomy -age 65     ILIAC ARTERY STENT     RIGHT ILIAC STENT   KNEE ARTHROSCOPY     LEFT HEART CATH AND CORONARY ANGIOGRAPHY N/A 03/05/2022   Procedure: LEFT HEART CATH AND CORONARY ANGIOGRAPHY;  Surgeon: [name], MD;   Location: [facility];  Service: Cardiovascular;  Laterality: N/A;   LITHOTRIPSY Left 12/2020   LUMBAR LAMINECTOMY     TUBAL LIGATION      Family History  Problem Relation Age of Onset   Alzheimer's disease Mother    Hodgkin's lymphoma Brother    Lung cancer Brother    Hepatitis C Brother    Hypothyroidism Brother    Hypertension Brother    Depression Brother    Heart disease Other        Grandfather   Social History   Socioeconomic History   Marital status: Divorced    Spouse name: Not on file   Number of children: 3   Years of education: 10 th   Highest education level: Not on file  Occupational History   Occupation: DISABLED    Employer: UNEMPLOYED  Tobacco Use   Smoking status: Every Day    Packs/day: 2.00    Years: 54.00    Additional pack years: 0.00    Total pack years: 108.00    Types: Cigarettes    Last attempt to quit: 11/20/2012    Years since quitting: 10.0   Smokeless tobacco: Never   Tobacco comments:    2 ppd for many years.     04/13/2022 Patient smokes 1/2 pack daily or more if she is nervous  Substance and Sexual Activity   Alcohol use: No    Alcohol/week: 0.0 standard drinks of alcohol   Drug use: No   Sexual activity: Never  Other Topics Concern   Not on file  Social History Narrative   Patient is single with 3 children, 1 deceased.  Patient is right handed.   Patient has 10 th grade education.   Patient drinks 5 or more cups daily.   Social Determinants of Health   Financial Resource Strain: Low Risk  (09/14/2022)   Overall Financial Resource Strain (CARDIA)    Difficulty of Paying Living Expenses: Not hard at all  Food Insecurity: No Food Insecurity (09/14/2022)   Hunger Vital Sign    Worried About Running Out of Food in the Last Year: Never true    Ran Out of Food in the Last Year: Never true  Transportation Needs: No Transportation Needs (09/14/2022)   PRAPARE - Hydrologist (Medical): No     Lack of Transportation (Non-Medical): No  Physical Activity: Inactive (09/14/2022)   Exercise Vital Sign    Days of Exercise per Week: 0 days    Minutes of Exercise per Session: 0 min  Stress: Stress Concern Present (09/14/2022)   [place]    Feeling of Stress : Rather much  Social Connections: Moderately Isolated (09/14/2022)   Social Connection and Isolation Panel [NHANES]    Frequency of Communication with Friends and Family: More than three times a week    Frequency of Social Gatherings with Friends and Family: Never    Attends Religious Services: More than 4 times per year    Active Member of [employer] or Organizations: No    Attends Music therapist: Never    Marital Status: Divorced    Objective:  BP 118/64   Pulse 83   Temp (!) 97.2 F (36.2 C)   Ht 5\' 5"  (1.651 m)   Wt 155 lb (70.3 kg)   SpO2 97%   BMI 25.79 kg/m      12/06/2022   10:28 AM 10/26/2022   11:32 AM 09/14/2022    8:50 AM  BP/Weight  Systolic BP 123456 123456 [id number]  Diastolic BP 64 68 60  Wt. (Lbs) 155 155.8 151  BMI 25.79 kg/m2 25.93 kg/m2 25.13 kg/m2    Physical Exam Vitals reviewed.  Constitutional:      Appearance: Normal appearance.  Neck:     Vascular: No carotid bruit.  Cardiovascular:     Rate and Rhythm: Normal rate and regular rhythm.     Heart sounds: Normal heart sounds.  Pulmonary:     Effort: Pulmonary effort is normal. No respiratory distress.     Breath sounds: Normal breath sounds.  Abdominal:     General: Abdomen is flat. Bowel sounds are normal.     Palpations: Abdomen is soft.     Tenderness: There is no abdominal tenderness.  Musculoskeletal:     Right lower leg: No edema.     Left lower leg: No edema.  Neurological:     General: No focal deficit present.     Mental Status: She is alert and oriented to person, place, and time.     Motor: No weakness.     Gait: Gait normal.  Psychiatric:         Mood and Affect: Mood normal.        Behavior: Behavior normal.     Diabetic Foot Exam - Simple   Simple Foot Form Diabetic Foot exam was performed with the following findings: Yes 12/06/2022 11:27 AM  Visual Inspection No deformities, no ulcerations, no other skin breakdown bilaterally: Yes Sensation Testing See comments: Yes Pulse Check Posterior Tibialis and Dorsalis pulse intact bilaterally: Yes Comments Decrease sensation  Lab Results  Component Value Date   WBC 11.5 (H) 10/26/2022   HGB 14.8 10/26/2022   HCT 46.3 10/26/2022   PLT 287 10/26/2022   GLUCOSE 408 (H) 10/26/2022   CHOL 113 09/12/2022   TRIG 113 09/12/2022   HDL 40 09/12/2022   LDLCALC 52 09/12/2022   ALT 18 10/26/2022   AST 18 10/26/2022   NA 138 10/26/2022   K 3.6 10/26/2022   CL 96 10/26/2022   CREATININE 0.85 10/26/2022   BUN 32 (H) 10/26/2022   CO2 23 10/26/2022   TSH 0.605 09/12/2022   INR 1.0 01/31/2019   HGBA1C 7.9 (H) 09/12/2022   MICROALBUR neg 02/27/2021      Assessment & Plan:    Diabetic polyneuropathy associated with type 2 diabetes mellitus ([place]) Assessment & Plan: Control: good Recommend check sugars fasting daily. Recommend check feet daily. Recommend annual eye exams. Medicines: Continue trulicity 3 mg weekly, farxiga 10 mg neurontin 300 mg before bed.  Continue to work on eating a healthy diet and exercise.      COPD mixed type [facility]) Assessment & Plan: The current medical regimen is effective;  continue present plan and medications. Continue  breo 100/25 mcg one inhalation daily, Albuterol hfa or nebs.   Coronary artery disease involving native coronary artery of native heart without angina pectoris Assessment & Plan: The current medical regimen is effective;  continue present plan and medications. Recommend continue to work on eating healthy diet and exercise.    Hypertensive heart disease with chronic combined systolic and diastolic congestive heart  failure (HCC) Assessment & Plan: The current medical regimen is effective;  continue present plan and medications. Continue zestoretic 20/25 mg daily and plavix 75 mg daily.  Stop furosemide (lasix.) Recommend weigh daily and if increase weight by 3 lbs in a day or 5 lbs in a week, take lasix 40 mg 1/2 daily.  Recommend continue to work on eating healthy diet and exercise.    Moderate recurrent major depression (HCC) Assessment & Plan: Increase fetzima to 80 mg daily   Orders: -     Fetzima; Take 80 mg by mouth daily.  Dispense: 30 capsule; Refill: 3  Mixed hyperlipidemia Assessment & Plan: Well controlled.  No changes to medicines. Continue fenofibrate 160 mg daily, rosuvastatin 20 mg daily. Vascepa 1 gm 2 capsules twice daily, and zetia 10 mg daily.   Continue to work on eating a healthy diet and exercise.      Mixed stress and urge urinary incontinence Assessment & Plan: Stop furosemide (lasix.) Recommend weigh daily and if increase weight by 3 lbs in a day or 5 lbs in a week, take lasix 40 mg 1/2 daily.    Chronic maxillary sinusitis Assessment & Plan: Start on bactrim ds one twice daily for chronic sinus infection x 30 days  Orders: -     Sulfamethoxazole-Trimethoprim; Take 1 tablet by mouth 2 (two) times daily.  Dispense: 60 tablet; Refill: 0     Meds ordered this encounter  Medications   sulfamethoxazole-trimethoprim (BACTRIM DS) 800-160 MG tablet    Sig: Take 1 tablet by mouth 2 (two) times daily.    Dispense:  60 tablet    Refill:  0   Levomilnacipran HCl ER (FETZIMA) 80 MG CP24    Sig: Take 80 mg by mouth daily.    Dispense:  30 capsule    Refill:  3    No orders of the defined types were placed in this encounter.  Follow-up: Return in about 2 weeks (around 12/20/2022) for lab visit, chronic fasting in 03/2023.   I,Katherina A Bramblett,acting as a scribe for [name], MD.,have documented all relevant documentation on the behalf of [name],  MD,as directed by  [name], MD while in the presence of [name], MD.   An After Visit Summary was printed and given to the patient.  I attest that I have reviewed this visit and agree with the plan scribed by my staff.   [name], MD [facility] [phone]

## 2022-12-06 NOTE — Patient Instructions (Addendum)
Start on bactrim ds one twice daily for chronic sinus infection.  Increase fetzima to 80 mg daily for depression. Stop furosemide (lasix.) Recommend weigh daily and if increase weight by 3 lbs in a day or 5 lbs in a week, take lasix 40 mg 1/2 daily.   For osteoporosis: we will work on Financial planner approved.

## 2022-12-10 ENCOUNTER — Telehealth: Payer: Self-pay

## 2022-12-10 ENCOUNTER — Other Ambulatory Visit: Payer: Self-pay

## 2022-12-10 ENCOUNTER — Encounter: Payer: Self-pay | Admitting: Family Medicine

## 2022-12-10 DIAGNOSIS — J329 Chronic sinusitis, unspecified: Secondary | ICD-10-CM | POA: Insufficient documentation

## 2022-12-10 DIAGNOSIS — I119 Hypertensive heart disease without heart failure: Secondary | ICD-10-CM

## 2022-12-10 MED ORDER — FUROSEMIDE 40 MG PO TABS: 40.0000 mg | ORAL_TABLET | Freq: Every day | ORAL | 0 refills | Status: DC

## 2022-12-10 NOTE — Assessment & Plan Note (Signed)
Start on bactrim ds one twice daily for chronic sinus infection x 30 days

## 2022-12-10 NOTE — Assessment & Plan Note (Signed)
Control: good Recommend check sugars fasting daily. Recommend check feet daily. Recommend annual eye exams. Medicines: Continue trulicity 3 mg weekly, farxiga 10 mg neurontin 300 mg before bed.  Continue to work on eating a healthy diet and exercise.

## 2022-12-10 NOTE — Assessment & Plan Note (Signed)
Stop furosemide (lasix.) Recommend weigh daily and if increase weight by 3 lbs in a day or 5 lbs in a week, take lasix 40 mg 1/2 daily.

## 2022-12-10 NOTE — Assessment & Plan Note (Signed)
Well controlled.  No changes to medicines. Continue fenofibrate 160 mg daily, rosuvastatin 20 mg daily. Vascepa 1 gm 2 capsules twice daily, and zetia 10 mg daily.   Continue to work on eating a healthy diet and exercise.

## 2022-12-10 NOTE — Assessment & Plan Note (Signed)
The current medical regimen is effective;  continue present plan and medications. Continue  breo 100/25 mcg one inhalation daily, Albuterol hfa or nebs. 

## 2022-12-10 NOTE — Assessment & Plan Note (Signed)
Increase fetzima to 80 mg daily

## 2022-12-10 NOTE — Assessment & Plan Note (Signed)
The current medical regimen is effective;  continue present plan and medications. Recommend continue to work on eating healthy diet and exercise.  

## 2022-12-10 NOTE — Assessment & Plan Note (Addendum)
The current medical regimen is effective;  continue present plan and medications. Continue zestoretic 20/25 mg daily and plavix 75 mg daily.  Stop furosemide (lasix.) Recommend weigh daily and if increase weight by 3 lbs in a day or 5 lbs in a week, take lasix 40 mg 1/2 daily.  Recommend continue to work on eating healthy diet and exercise.

## 2022-12-10 NOTE — Progress Notes (Unsigned)
Start on bactrim ds one twice daily for chronic sinus infection.  Increase fetzima to 80 mg daily for depression.  Stop furosemide (lasix.) Recommend weigh daily and if increase weight by 3 lbs in a day or 5 lbs in a week, take lasix 40 mg 1/2 daily.   Evenity benefit verification sent, awaiting determination.

## 2022-12-12 ENCOUNTER — Other Ambulatory Visit: Payer: Self-pay | Admitting: Family Medicine

## 2022-12-12 ENCOUNTER — Other Ambulatory Visit: Payer: Medicare Other | Admitting: Pharmacist

## 2022-12-12 DIAGNOSIS — R5383 Other fatigue: Secondary | ICD-10-CM

## 2022-12-13 ENCOUNTER — Telehealth: Payer: Self-pay

## 2022-12-13 NOTE — Progress Notes (Cosign Needed)
Evenity benefits:  Physician purchase available, no Prior authorization required.  For the Primary MD Purchase access option, this patient is enrolled in the Qualified Medicare Beneficiary (QMB) program and will not be subject to deductible or coinsurance for the administration and cost of Evenity. Patient has no deductible or coinsurance.  Called patient to inform that she has no cost to receive her Evenity, no answer, left message to return or call and I will also have PCP office contact her to schedule an appointment for injection.  Sent message to clinical team Joelene Millin Smith,LPN to order injection and call patient to schedule an appointment.   Pattricia Boss, [place] Pharmacist Assistant [phone]

## 2022-12-14 ENCOUNTER — Telehealth: Payer: Self-pay

## 2022-12-17 ENCOUNTER — Other Ambulatory Visit: Payer: Self-pay

## 2022-12-17 MED ORDER — FETZIMA 40 MG PO CP24: 40.0000 mg | ORAL_CAPSULE | Freq: Every day | ORAL | 0 refills | Status: DC

## 2022-12-17 MED ORDER — FUROSEMIDE 20 MG PO TABS: 20.0000 mg | ORAL_TABLET | Freq: Every day | ORAL | 0 refills | Status: DC

## 2022-12-17 NOTE — Progress Notes (Cosign Needed)
Patient returned my call regarding Evenity injections, patient aware she does not have any responsibility for injection, her insurance covers at 100%. Patient would like to proceed with shots and aware I will send a message to clinical team member [name] to reach out to patient to schedule Evenity injections. Patient also wanted to mention that she was not available on the following days: 4/3, 4/17, 4/28, 5/3, 4/15, 4/27.  Patient then mentioned some concerns she has with some of her medications and really missed connecting with Rae Roam and [facility], she felt like the help, reviews and explanations they gave to her including monitoring her medications through [place] was the best and would like to transfer back to [place]. Patient is currently with [facility]. Patient aware I will speak with my supervisiors on this transfer, patient at the beginning of the year was not [facility] but she is now, patient aware I will connect with her in a week or 2 when she come in for her Evenity injection.  Pattricia Boss, [place] Pharmacist Assistant [phone]

## 2022-12-20 ENCOUNTER — Other Ambulatory Visit: Payer: Medicare Other

## 2022-12-21 LAB — HEPATITIS C ANTIBODY: Hep C Virus Ab: NONREACTIVE

## 2022-12-25 ENCOUNTER — Telehealth: Payer: Self-pay | Admitting: Cardiology

## 2022-12-25 NOTE — Telephone Encounter (Signed)
Spoke to patient she stated she has been feeling bad for several months.Stated she is having numbness in both feet and hands.She is weak.Stated symptoms are getting worse.No sob.No chest pain.Appointment scheduled with Diona Browner NP 4/5 at 2:45 pm.

## 2022-12-25 NOTE — Telephone Encounter (Signed)
Pt c/o swelling: STAT is pt has developed SOB within 24 hours  If swelling, where is the swelling located? LE  How much weight have you gained and in what time span? N/A  Have you gained 3 pounds in a day or 5 pounds in a week? N/A  Do you have a log of your daily weights (if so, list)? No   Are you currently taking a fluid pill? Yes   Are you currently SOB? No   Have you traveled recently? No   Patient also have dizzy spells. Patient stated that she can't hold anything in her hands and have trouble standing and walking

## 2022-12-27 ENCOUNTER — Ambulatory Visit: Payer: Medicare Other

## 2022-12-28 ENCOUNTER — Encounter: Payer: Self-pay | Admitting: Nurse Practitioner

## 2022-12-28 ENCOUNTER — Ambulatory Visit: Payer: Medicare Other | Attending: Nurse Practitioner | Admitting: Nurse Practitioner

## 2022-12-28 VITALS — BP 92/53 | HR 73 | Ht 65.0 in | Wt 153.0 lb

## 2022-12-28 DIAGNOSIS — I2510 Atherosclerotic heart disease of native coronary artery without angina pectoris: Secondary | ICD-10-CM | POA: Insufficient documentation

## 2022-12-28 DIAGNOSIS — I739 Peripheral vascular disease, unspecified: Secondary | ICD-10-CM | POA: Insufficient documentation

## 2022-12-28 DIAGNOSIS — Z8673 Personal history of transient ischemic attack (TIA), and cerebral infarction without residual deficits: Secondary | ICD-10-CM | POA: Insufficient documentation

## 2022-12-28 DIAGNOSIS — I5032 Chronic diastolic (congestive) heart failure: Secondary | ICD-10-CM | POA: Diagnosis not present

## 2022-12-28 DIAGNOSIS — I10 Essential (primary) hypertension: Secondary | ICD-10-CM | POA: Diagnosis not present

## 2022-12-28 DIAGNOSIS — G4733 Obstructive sleep apnea (adult) (pediatric): Secondary | ICD-10-CM | POA: Insufficient documentation

## 2022-12-28 DIAGNOSIS — E785 Hyperlipidemia, unspecified: Secondary | ICD-10-CM | POA: Diagnosis present

## 2022-12-28 NOTE — Progress Notes (Signed)
Office Visit    Patient Name: Sonya Small Date of Encounter: 12/28/2022  Primary Care Provider:  Blane Ohara, MD Primary Cardiologist:  [name] [name], MD  Chief Complaint    72 year old female with a history of CAD s/p DES-left circumflex, chronic diastolic heart failure, PAD, CVA, OSA, hypertension, and hyperlipidemia who presents for follow-up related to CAD and heart failure.  Past Medical History    Past Medical History:  Diagnosis Date   Anxiety    CAD (coronary artery disease)    a. NSTEMI 11/2008 s/p DES to LCx (3.0x12 Xience); b. NSTEMI 01/2010 secondary to thrombotic RCA lesion (non-obstructive)-->med rx (integrilin x 24 hrs + plavix); c. 09/2012 negative Myoview.   Chronic diastolic CHF (congestive heart failure)    a. 06/2014 Echo: EF 55-60%, no rwma, Gr1 DD, mild AI.   Depression    Dizziness    Drug induced constipation    Ganglion cyst of left foot 01/17/2021   Generalized hyperhidrosis    GERD (gastroesophageal reflux disease)    Headache    Hyperlipidemia    Hypertensive heart disease    Lumbar disc disease    Metabolic encephalopathy    Mixed hyperlipidemia    Myocardial infarction    Obstructive sleep apnea    OP (osteoporosis)    Osteoarthritis    Osteoporosis    Other malaise    Overweight(278.02)    PAD (peripheral artery disease)    a. Emboli to R foot 2010 from partially occlusive lesion in R EIA, s/p stenting. - followed by Dr. Arbie Cookey;  b. 10/2015 ABIs: R 1.03, L 0.97.   Restless leg    Sleep apnea    Stroke    TIA (transient ischemic attack)    Tobacco abuse    Urge incontinence    Past Surgical History:  Procedure Laterality Date   CYST EXCISION Left 01/2021   left foot   EYE SURGERY     at age 35   hysterectomy -age 89     ILIAC ARTERY STENT     RIGHT ILIAC STENT   KNEE ARTHROSCOPY     LEFT HEART CATH AND CORONARY ANGIOGRAPHY N/A 03/05/2022   Procedure: LEFT HEART CATH AND CORONARY ANGIOGRAPHY;  Surgeon: Corky Crafts, MD;   Location: [facility];  Service: Cardiovascular;  Laterality: N/A;   LITHOTRIPSY Left 12/2020   LUMBAR LAMINECTOMY     TUBAL LIGATION      Allergies  Allergies  Allergen Reactions   Abilify [Aripiprazole]     confusion   Latex Rash     Labs/Other Studies Reviewed    The following studies were reviewed today: ***  Recent Labs: 03/16/2022: Magnesium 2.3 09/12/2022: TSH 0.605 10/26/2022: ALT 18; BUN 32; Creatinine, Ser 0.85; Hemoglobin 14.8; Platelets 287; Potassium 3.6; Sodium 138  Recent Lipid Panel    Component Value Date/Time   CHOL 113 09/12/2022 0917   TRIG 113 09/12/2022 0917   HDL 40 09/12/2022 0917   CHOLHDL 2.8 09/12/2022 0917   CHOLHDL 5.0 07/13/2014 0108   VLDL 42 (H) 07/13/2014 0108   LDLCALC 52 09/12/2022 0917    History of Present Illness    72 year old female with the above past medical history including CAD s/p DES-left circumflex, chronic diastolic heart failure, PAD, CVA, OSA, hypertension, and hyperlipidemia.  She was hospitalized in March 2010 the setting of NSTEMI s/p DES-LCx.  She had another NSTEMI in May 2011 secondary to thrombotic RCA lesion, cardiac catheterization showed nonobstructive disease, medical  therapy was recommended.  She had recurrent chest pain in January 2014, Myoview was negative.  She was hospitalized in October 2015 in the setting of acute CVA.  She was started on aspirin and Plavix.  ABIs in 2018 were normal.  Repeat Myoview in July 2019 showed EF 55%, small moderate intensity fixed apical perfusion defect, likely attenuation artifact, no other reversible ischemia, overall low risk.  Follow-up in 04/2018 she reported dizziness.  This was thought to be in the setting of polypharmacy.  Carotid Dopplers were checked and were unremarkable.  She was hospitalized in May 2020 with pneumonia and respiratory failure requiring ventilator support.  He did smoke at the time.  Repeat Myoview in 07/2021 in the setting of pain across her  shoulder blades and upper back was low risk, unchanged from prior.  At her follow-up visit in 02/2022 she noted ongoing intermittent chest pain, difficulty staying awake, numbness in her fingers and toes as well as blue discoloration of her toes.  Cardiac catheterization 06/29/2022 showed no significant coronary artery disease, apical hypokinesis consistent with Takotsubo cardiomyopathy pattern.  Lower extremity arterial duplex, ABIs were normal. She was last seen in the office on 04/13/2022 and was stable from a cardiac standpoint.  She contacted our office on 12/25/2022 noted a several month history of "feeling bad" numbness in her feet and hands, generalized weakness.  She denies chest pain, shortness of breath.  She presents today for follow-up.  Since her last visit BP Readings from Last 3 Encounters:  12/28/22 (!) 92/53  12/06/22 118/64  10/26/22 118/68   Accompanied by her sister.  Since her last visit and since she contacted our office she has been stable overall from a cardiac standpoint though she does note increased sleepiness, she has nightly pain across her shoulder blades that improves with gabapentin.  She has increased shortness of breath, bilateral lower extremity edema.  She recently saw her pulmonologist who is ordering a home O2 study.  She recently had an MRI with her PCP which showed chronic cerebellar stroke.  She does note that she has had some issues with balance.  BP is low.  She denies any presyncope, syncope.  Denies has had some recent falls.  Will check BMET, BMP today.  Will decrease lisinopril, hydrochlorothiazide to 10/12.5 mg daily.  Follow-up as scheduled with Dr. SwazilandJordan.  Continue to monitor BP and report SBP consistently less than 100, greater than 140. CAD: Chronic diastolic heart failure: PAD: S/p right external iliac artery stenting.  Greater than 50 g% stenosis of the left common iliac artery.  ABIs in 2023 were normal. ABIs  Repeat lower extremity arterial   Hypertension: Hyperlipidemia: OSA: History of CVA: Disposition:   Home Medications    Current Outpatient Medications  Medication Sig Dispense Refill   albuterol (PROVENTIL) (2.5 MG/3ML) 0.083% nebulizer solution Take 2.5 mg by nebulization every 6 (six) hours as needed for wheezing or shortness of breath.     albuterol (VENTOLIN HFA) 108 (90 Base) MCG/ACT inhaler Inhale 2 puffs into the lungs every 4 (four) hours as needed for wheezing or shortness of breath.     ALPRAZolam (XANAX) 0.5 MG tablet Take 1 tablet (0.5 mg total) by mouth 3 (three) times daily as needed for anxiety. 90 tablet 0   Aspirin-Acetaminophen (GOODYS BODY PAIN PO) Take 1 packet by mouth daily.     blood glucose meter kit and supplies KIT Dispense based on patient and insurance preference. Use up to four times daily as directed.  1 each 0   clopidogrel (PLAVIX) 75 MG tablet TAKE ONE TABLET BY MOUTH EVERY MORNING 90 tablet 0   dapagliflozin propanediol (FARXIGA) 10 MG TABS tablet Take 1 tablet (10 mg total) by mouth daily. 30 tablet 2   docusate sodium (COLACE) 100 MG capsule Take 100 mg by mouth 2 (two) times daily as needed for mild constipation.     ezetimibe (ZETIA) 10 MG tablet TAKE ONE TABLET BY MOUTH ONCE DAILY 90 tablet 0   fenofibrate 160 MG tablet TAKE ONE TABLET BY MOUTH EVERY MORNING 90 tablet 0   fluticasone (FLONASE) 50 MCG/ACT nasal spray INSTILL 1 SPRAY IN EACH NOSTRIL ONCE DAILY (Patient taking differently: Place 1 spray into both nostrils daily as needed for rhinitis.) 16 g 1   furosemide (LASIX) 20 MG tablet Take 1 tablet (20 mg total) by mouth daily. 90 tablet 0   gabapentin (NEURONTIN) 300 MG capsule Take 1 capsule (300 mg total) by mouth 2 (two) times daily. TAKE ONE CAPSULE BY MOUTH EVERYDAY AT BEDTIME 180 capsule 1   glucose blood (ONETOUCH ULTRA) test strip DISPENSE BASED ON PATIENT AND INSURANCE PREFERENCE. USE UP TO FOUR TIMES DAILY AS DIRECTED. 400 strip 3   lamoTRIgine (LAMICTAL) 25 MG tablet  Take 2 tablets (50 mg total) by mouth at bedtime. 180 tablet 1   lansoprazole (PREVACID) 30 MG capsule TAKE ONE CAPSULE BY MOUTH BEFORE BREAKFAST 90 capsule 3   Levomilnacipran HCl ER (FETZIMA) 40 MG CP24 Take 40 mg by mouth daily. 90 capsule 0   lisinopril-hydrochlorothiazide (ZESTORETIC) 10-12.5 MG tablet Take 1 tablet by mouth daily.     montelukast (SINGULAIR) 10 MG tablet Take 10 mg by mouth daily.     naloxone (NARCAN) nasal spray 4 mg/0.1 mL SMARTSIG:1 Both Nares Daily     nitroGLYCERIN (NITROSTAT) 0.4 MG SL tablet Place 1 tablet (0.4 mg total) under the tongue every 5 (five) minutes as needed. 25 tablet 1   oxyCODONE-acetaminophen (PERCOCET) 10-325 MG tablet Take 1 tablet by mouth every 6 (six) hours as needed for pain.     potassium chloride SA (KLOR-CON M) 20 MEQ tablet Take 1 tablet (20 mEq total) by mouth daily. 90 tablet 1   rosuvastatin (CRESTOR) 20 MG tablet TAKE ONE TABLET BY MOUTH EVERYDAY AT BEDTIME 90 tablet 0   sulfamethoxazole-trimethoprim (BACTRIM DS) 800-160 MG tablet Take 1 tablet by mouth 2 (two) times daily. 60 tablet 0   TRELEGY ELLIPTA 200-62.5-25 MCG/ACT AEPB Inhale 1 puff into the lungs daily.     TRUEplus Lancets 30G MISC 1 each by Does not apply route 2 (two) times daily. E11.69 400 each 3   TRULICITY 4.5 MG/0.5ML SOPN Inject 4.5 mg into the skin once a week. 2 mL 2   valACYclovir (VALTREX) 1000 MG tablet TAKE TWO TABLETS BY MOUTH twice A DAY as needed 20 tablet 2   VASCEPA 1 g capsule TAKE TWO CAPSULES BY MOUTH TWICE DAILY 360 capsule 1   Current Facility-Administered Medications  Medication Dose Route Frequency Provider Last Rate Last Admin   sodium chloride flush (NS) 0.9 % injection 3 mL  3 mL Intravenous Q12H SwazilandJordan, [name], MD         Review of Systems    ***.  All other systems reviewed and are otherwise negative except as noted above.    Physical Exam    VS:  BP (!) 92/53 (BP Location: Left Arm, Patient Position: Sitting, Cuff Size: Normal)    Pulse 73   Ht  5\' 5"  (1.651 m)   Wt 153 lb (69.4 kg)   BMI 25.46 kg/m   GEN: Well nourished, well developed, in no acute distress. HEENT: normal. Neck: Supple, no JVD, carotid bruits, or masses. Cardiac: RRR, no murmurs, rubs, or gallops. No clubbing, cyanosis, edema.  Radials/DP/PT 2+ and equal bilaterally.  Respiratory:  Respirations regular and unlabored, clear to auscultation bilaterally. GI: Soft, nontender, nondistended, BS + x 4. MS: no deformity or atrophy. Skin: warm and dry, no rash. Neuro:  Strength and sensation are intact. Psych: Normal affect.  Accessory Clinical Findings    ECG personally reviewed by me today -NSR, 73 bpm- no acute changes.  Lab Results  Component Value Date   WBC 11.5 (H) 10/26/2022   HGB 14.8 10/26/2022   HCT 46.3 10/26/2022   MCV 91 10/26/2022   PLT 287 10/26/2022   Lab Results  Component Value Date   CREATININE 0.85 10/26/2022   BUN 32 (H) 10/26/2022   NA 138 10/26/2022   K 3.6 10/26/2022   CL 96 10/26/2022   CO2 23 10/26/2022   Lab Results  Component Value Date   ALT 18 10/26/2022   AST 18 10/26/2022   ALKPHOS 88 10/26/2022   BILITOT 0.2 10/26/2022   Lab Results  Component Value Date   CHOL 113 09/12/2022   HDL 40 09/12/2022   LDLCALC 52 09/12/2022   TRIG 113 09/12/2022   CHOLHDL 2.8 09/12/2022    Lab Results  Component Value Date   HGBA1C 7.9 (H) 09/12/2022    Assessment & Plan    1.  ***      Joylene Grapes, NP 12/28/2022, 4:32 PM

## 2022-12-28 NOTE — Patient Instructions (Addendum)
Medication Instructions:  Decrease Lisinopril/Hydrochlorothiazide 10/12.5 mg daily   *If you need a refill on your cardiac medications before your next appointment, please call your pharmacy*   Lab Work: Your physician recommends that you complete lab work today CBC, BMP, BMET  If you have labs (blood work) drawn today and your tests are completely normal, you will receive your results only by: MyChart Message (if you have MyChart) OR A paper copy in the mail If you have any lab test that is abnormal or we need to change your treatment, we will call you to review the results.   Testing/Procedures: NONE ordered at this time of appointment     Follow-Up: At [facility], you and your health needs are our priority.  As part of our continuing mission to provide you with exceptional heart care, we have created designated Provider Care Teams.  These Care Teams include your primary Cardiologist (physician) and Advanced Practice Providers (APPs -  Physician Assistants and Nurse Practitioners) who all work together to provide you with the care you need, when you need it.  We recommend signing up for the patient portal called "MyChart".  Sign up information is provided on this After Visit Summary.  MyChart is used to connect with patients for Virtual Visits (Telemedicine).  Patients are able to view lab/test results, encounter notes, upcoming appointments, etc.  Non-urgent messages can be sent to your provider as well.   To learn more about what you can do with MyChart, go to [email or web address].    Your next appointment:    Keep follow up   Provider:   [name] [name], MD     Other Instructions Monitor blood pressure. Report systolic blood pressure (top number) consistently greater than 140 or less than 100.

## 2022-12-29 ENCOUNTER — Encounter: Payer: Self-pay | Admitting: Internal Medicine

## 2022-12-29 LAB — CBC
Hematocrit: 43.1 % (ref 34.0–46.6)
Hemoglobin: 13.6 g/dL (ref 11.1–15.9)
MCH: 27.9 pg (ref 26.6–33.0)
MCHC: 31.6 g/dL (ref 31.5–35.7)
MCV: 89 fL (ref 79–97)
Platelets: 295 10*3/uL (ref 150–450)
RBC: 4.87 x10E6/uL (ref 3.77–5.28)
RDW: 12.3 % (ref 11.7–15.4)
WBC: 13.2 10*3/uL — ABNORMAL HIGH (ref 3.4–10.8)

## 2022-12-29 LAB — BASIC METABOLIC PANEL
BUN/Creatinine Ratio: 27 (ref 12–28)
BUN: 38 mg/dL — ABNORMAL HIGH (ref 8–27)
CO2: 21 mmol/L (ref 20–29)
Calcium: 9.9 mg/dL (ref 8.7–10.3)
Chloride: 100 mmol/L (ref 96–106)
Creatinine, Ser: 1.4 mg/dL — ABNORMAL HIGH (ref 0.57–1.00)
Glucose: 133 mg/dL — ABNORMAL HIGH (ref 70–99)
Potassium: 5.9 mmol/L (ref 3.5–5.2)
Sodium: 138 mmol/L (ref 134–144)
eGFR: 40 mL/min/{1.73_m2} — ABNORMAL LOW (ref >59)

## 2022-12-29 LAB — BRAIN NATRIURETIC PEPTIDE: BNP: 12.1 pg/mL (ref 0.0–100.0)

## 2022-12-30 ENCOUNTER — Encounter: Payer: Self-pay | Admitting: Nurse Practitioner

## 2023-01-01 ENCOUNTER — Telehealth: Payer: Self-pay

## 2023-01-01 ENCOUNTER — Other Ambulatory Visit: Payer: Self-pay

## 2023-01-01 DIAGNOSIS — E875 Hyperkalemia: Secondary | ICD-10-CM

## 2023-01-01 NOTE — Telephone Encounter (Signed)
Spoke with pt. Pt was notified of lab results and is aware that she needs repeat labs completed at any [facility]. Orders placed.

## 2023-01-01 NOTE — Telephone Encounter (Signed)
Patient called stating that she is still not feeling better, and she is still concerned because her legs and feet are still swelled and they feel like they are going to bust. She states she still feels like she is drunk and they only thing that was recommended to her was to take 1/2 of her bp pill instead of a whole one. She states that she should not still be feeling like this and is very frustrated about still feeling bad. Please advise, and please advise if you still want patient to come back for nurse visit in the morning to recheck her potassium.

## 2023-01-02 ENCOUNTER — Other Ambulatory Visit: Payer: Medicare Other | Admitting: Pharmacist

## 2023-01-02 ENCOUNTER — Telehealth: Payer: Self-pay

## 2023-01-02 ENCOUNTER — Ambulatory Visit (INDEPENDENT_AMBULATORY_CARE_PROVIDER_SITE_OTHER): Payer: Medicare Other | Admitting: Family Medicine

## 2023-01-02 VITALS — BP 112/62 | HR 77 | Temp 97.2°F | Ht 65.0 in | Wt 155.0 lb

## 2023-01-02 DIAGNOSIS — I5042 Chronic combined systolic (congestive) and diastolic (congestive) heart failure: Secondary | ICD-10-CM

## 2023-01-02 DIAGNOSIS — N3946 Mixed incontinence: Secondary | ICD-10-CM

## 2023-01-02 DIAGNOSIS — R5383 Other fatigue: Secondary | ICD-10-CM | POA: Diagnosis not present

## 2023-01-02 DIAGNOSIS — R3589 Other polyuria: Secondary | ICD-10-CM

## 2023-01-02 DIAGNOSIS — E1140 Type 2 diabetes mellitus with diabetic neuropathy, unspecified: Secondary | ICD-10-CM | POA: Diagnosis not present

## 2023-01-02 DIAGNOSIS — I110 Hypertensive heart disease with heart failure: Secondary | ICD-10-CM

## 2023-01-02 DIAGNOSIS — E875 Hyperkalemia: Secondary | ICD-10-CM

## 2023-01-02 LAB — POCT URINALYSIS DIPSTICK
Bilirubin, UA: NEGATIVE
Blood, UA: NEGATIVE
Glucose, UA: POSITIVE — AB
Ketones, UA: NEGATIVE
Leukocytes, UA: NEGATIVE
Nitrite, UA: NEGATIVE
Protein, UA: NEGATIVE
Spec Grav, UA: 1.025 (ref 1.010–1.025)
Urobilinogen, UA: NEGATIVE E.U./dL — AB
pH, UA: 6 (ref 5.0–8.0)

## 2023-01-02 MED ORDER — TORSEMIDE 10 MG PO TABS: 10.0000 mg | ORAL_TABLET | Freq: Every day | ORAL | 0 refills | Status: DC

## 2023-01-02 NOTE — Progress Notes (Signed)
Care Management & Coordination Services Pharmacy Team  Reason for Encounter: General adherence update   Contacted patient for general health update and medication adherence call.  Spoke with patient on 01/02/2023    What concerns do you have about your medications? No concerns   The patient denies side effects with their medications.   How often do you forget or accidentally miss a dose? Never  Do you use a pillbox? No  Are you having any problems getting your medications from your pharmacy? No  Has the cost of your medications been a concern? No, pt is only paying $4.60 to get her Trulicity and Sonya Small   The patient has not had an ED visit since last contact.   The patient reports the following problems with their health. Pt stated her BP have been running lower around 90/59 70 but Cardiologist did decrease her BP medication. Pt is having so much swelling her her legs and feet. She is having some incontinence while taking her diuretic and has to wear depends in case. Pt stated her sugars have been good. 01/02/23 118.  Patient reports the following concerns or questions for [name], PharmD at this time. Pt has a follow with Harrold Donath scheduled for May 7th at 9:00 am and has questions about a lot of her medications.   Counseled patient on: Great job taking medications   Chart Updates:  Recent office visits:  12/06/22 Blane Ohara MD. Seen for routine visit. Started on Bactrim. Increase fetzima to 80 mg daily for depression. Stop furosemide (lasix.) Recommend weigh daily and if increase weight by 3 lbs in a day or 5 lbs in a week, take lasix 40 mg 1/2 daily.   10/26/22 Blane Ohara MD. Seen for DM. Started on Tirzepatide 2.5mg . Increase Farxiga to 10 mg daily.   09/14/22 Flonnie Hailstone NP. Medicare Annual Wellness. D/C Baclofen, Diclofenac Sodium, Insulin Degludec, Methocarbamol.   09/12/22 Blane Ohara MD. Seen for routine visit. Increased Levomilnacipran to 40mg .  Recent  consult visits:  12/28/22 (Cardiology) Bernadene Person NP. Seen for CAD. Decrease Lisinopril/Hydrochlorothiazide 10/12.5 mg daily   Hospital visits:  None  Medications: Outpatient Encounter Medications as of 01/02/2023  Medication Sig   albuterol (PROVENTIL) (2.5 MG/3ML) 0.083% nebulizer solution Take 2.5 mg by nebulization every 6 (six) hours as needed for wheezing or shortness of breath.   albuterol (VENTOLIN HFA) 108 (90 Base) MCG/ACT inhaler Inhale 2 puffs into the lungs every 4 (four) hours as needed for wheezing or shortness of breath.   ALPRAZolam (XANAX) 0.5 MG tablet Take 1 tablet (0.5 mg total) by mouth 3 (three) times daily as needed for anxiety.   Aspirin-Acetaminophen (GOODYS BODY PAIN PO) Take 1 packet by mouth daily.   blood glucose meter kit and supplies KIT Dispense based on patient and insurance preference. Use up to four times daily as directed.   clopidogrel (PLAVIX) 75 MG tablet TAKE ONE TABLET BY MOUTH EVERY MORNING   dapagliflozin propanediol (FARXIGA) 10 MG TABS tablet Take 1 tablet (10 mg total) by mouth daily.   docusate sodium (COLACE) 100 MG capsule Take 100 mg by mouth 2 (two) times daily as needed for mild constipation.   ezetimibe (ZETIA) 10 MG tablet TAKE ONE TABLET BY MOUTH ONCE DAILY   fenofibrate 160 MG tablet TAKE ONE TABLET BY MOUTH EVERY MORNING   fluticasone (FLONASE) 50 MCG/ACT nasal spray INSTILL 1 SPRAY IN EACH NOSTRIL ONCE DAILY (Patient taking differently: Place 1 spray into both nostrils daily as needed for rhinitis.)  furosemide (LASIX) 20 MG tablet Take 1 tablet (20 mg total) by mouth daily.   gabapentin (NEURONTIN) 300 MG capsule Take 1 capsule (300 mg total) by mouth 2 (two) times daily. TAKE ONE CAPSULE BY MOUTH EVERYDAY AT BEDTIME   glucose blood (ONETOUCH ULTRA) test strip DISPENSE BASED ON PATIENT AND INSURANCE PREFERENCE. USE UP TO FOUR TIMES DAILY AS DIRECTED.   lamoTRIgine (LAMICTAL) 25 MG tablet Take 2 tablets (50 mg total) by mouth at  bedtime.   lansoprazole (PREVACID) 30 MG capsule TAKE ONE CAPSULE BY MOUTH BEFORE BREAKFAST   Levomilnacipran HCl ER (FETZIMA) 40 MG CP24 Take 40 mg by mouth daily.   lisinopril-hydrochlorothiazide (ZESTORETIC) 10-12.5 MG tablet Take 1 tablet by mouth daily.   montelukast (SINGULAIR) 10 MG tablet Take 10 mg by mouth daily.   naloxone (NARCAN) nasal spray 4 mg/0.1 mL SMARTSIG:1 Both Nares Daily   nitroGLYCERIN (NITROSTAT) 0.4 MG SL tablet Place 1 tablet (0.4 mg total) under the tongue every 5 (five) minutes as needed.   oxyCODONE-acetaminophen (PERCOCET) 10-325 MG tablet Take 1 tablet by mouth every 6 (six) hours as needed for pain.   potassium chloride SA (KLOR-CON M) 20 MEQ tablet Take 1 tablet (20 mEq total) by mouth daily.   rosuvastatin (CRESTOR) 20 MG tablet TAKE ONE TABLET BY MOUTH EVERYDAY AT BEDTIME   sulfamethoxazole-trimethoprim (BACTRIM DS) 800-160 MG tablet Take 1 tablet by mouth 2 (two) times daily.   TRELEGY ELLIPTA 200-62.5-25 MCG/ACT AEPB Inhale 1 puff into the lungs daily.   TRUEplus Lancets 30G MISC 1 each by Does not apply route 2 (two) times daily. E11.69   TRULICITY 4.5 MG/0.5ML SOPN Inject 4.5 mg into the skin once a week.   valACYclovir (VALTREX) 1000 MG tablet TAKE TWO TABLETS BY MOUTH twice A DAY as needed   VASCEPA 1 g capsule TAKE TWO CAPSULES BY MOUTH TWICE DAILY   Facility-Administered Encounter Medications as of 01/02/2023  Medication   sodium chloride flush (NS) 0.9 % injection 3 mL    Recent vitals BP Readings from Last 3 Encounters:  12/28/22 (!) 92/53  12/06/22 118/64  10/26/22 118/68   Pulse Readings from Last 3 Encounters:  12/28/22 73  12/06/22 83  10/26/22 100   Wt Readings from Last 3 Encounters:  12/28/22 153 lb (69.4 kg)  12/06/22 155 lb (70.3 kg)  10/26/22 155 lb 12.8 oz (70.7 kg)   BMI Readings from Last 3 Encounters:  12/28/22 25.46 kg/m  12/06/22 25.79 kg/m  10/26/22 25.93 kg/m    Recent lab results    Component Value  Date/Time   NA 138 12/28/2022 1549   K 5.9 (HH) 12/28/2022 1549   CL 100 12/28/2022 1549   CO2 21 12/28/2022 1549   GLUCOSE 133 (H) 12/28/2022 1549   GLUCOSE 179 (H) 10/19/2020 1606   BUN 38 (H) 12/28/2022 1549   CREATININE 1.40 (H) 12/28/2022 1549   CALCIUM 9.9 12/28/2022 1549    Lab Results  Component Value Date   CREATININE 1.40 (H) 12/28/2022   EGFR 40 (L) 12/28/2022   GFRNONAA >60 10/19/2020   GFRAA 97 10/03/2020   Lab Results  Component Value Date/Time   HGBA1C 7.9 (H) 09/12/2022 09:17 AM   HGBA1C 8.5 (H) 06/11/2022 10:51 AM   MICROALBUR neg 02/27/2021 10:10 AM   MICROALBUR 80 11/25/2020 08:27 AM    Lab Results  Component Value Date   CHOL 113 09/12/2022   HDL 40 09/12/2022   LDLCALC 52 09/12/2022   TRIG 113 09/12/2022  CHOLHDL 2.8 09/12/2022    Care Gaps: Annual wellness visit in last year? Yes  If Diabetic: Last eye exam / retinopathy screening:09/04/22  Last diabetic foot exam:12/06/22 Last UACR: 01/24/22  Star Rating Drugs:  Medication:  Last Fill: Day Supply Dapagliflozin   12/17/22-11/19/22 30ds Lisinopril-HCTZ 12/14/22-11/16/22 30ds Rosuvastatin   12/14/22-11/16/22 30ds Trulicity   12/17/22-11/19/22 28ds   Roxana Hiresanielle Gerringer, CMA Clinical Pharmacist Assistant  [phone]

## 2023-01-02 NOTE — Telephone Encounter (Signed)
Appt scheduled WED.

## 2023-01-02 NOTE — Progress Notes (Signed)
Acute Office Visit  Subjective:    Patient ID: Sonya Small, female    DOB: 12-Nov-1950, 72 y.o.   MRN: [id number]  Chief Complaint  Patient presents with   Fatigue    HPI: Patient is in today for malaise and edema in her lower legs. She is wearing compression stockings, but she does not feel like they help.  States if she has a urge to use the restroom whether it is bladder or bowels, she has to go right then otherwise she will use it on herself. Frustrated with feeling bad. I stopped lasix in mid March, but her swelling has worsened and she continues to urinate  a lot.   Both arms are jerking. Toes turning "blacK"   Patient has pain in face on the right side radiates on to her forehead.   Diabetes: sugar was 118 BP 96/59.  Past Medical History:  Diagnosis Date   Anxiety    CAD (coronary artery disease)    a. NSTEMI 11/2008 s/p DES to LCx (3.0x12 Xience); b. NSTEMI 01/2010 secondary to thrombotic RCA lesion (non-obstructive)-->med rx (integrilin x 24 hrs + plavix); c. 09/2012 negative Myoview.   Chronic diastolic CHF (congestive heart failure)    a. 06/2014 Echo: EF 55-60%, no rwma, Gr1 DD, mild AI.   Depression    Dizziness    Drug induced constipation    Ganglion cyst of left foot 01/17/2021   Generalized hyperhidrosis    GERD (gastroesophageal reflux disease)    Headache    Hyperlipidemia    Hypertensive heart disease    Lumbar disc disease    Metabolic encephalopathy    Mixed hyperlipidemia    Myocardial infarction    Obstructive sleep apnea    OP (osteoporosis)    Osteoarthritis    Osteoporosis    Other malaise    Overweight(278.02)    PAD (peripheral artery disease)    a. Emboli to R foot 2010 from partially occlusive lesion in R EIA, s/p stenting. - followed by Dr. Arbie Cookey;  b. 10/2015 ABIs: R 1.03, L 0.97.   Restless leg    Sleep apnea    Stroke    TIA (transient ischemic attack)    Tobacco abuse    Urge incontinence     Past Surgical History:  Procedure  Laterality Date   CYST EXCISION Left 01/2021   left foot   EYE SURGERY     at age 48   hysterectomy -age 16     ILIAC ARTERY STENT     RIGHT ILIAC STENT   KNEE ARTHROSCOPY     LEFT HEART CATH AND CORONARY ANGIOGRAPHY N/A 03/05/2022   Procedure: LEFT HEART CATH AND CORONARY ANGIOGRAPHY;  Surgeon: Corky Crafts, MD;  Location: [facility];  Service: Cardiovascular;  Laterality: N/A;   LITHOTRIPSY Left 12/2020   LUMBAR LAMINECTOMY     TUBAL LIGATION      Family History  Problem Relation Age of Onset   Alzheimer's disease Mother    Hodgkin's lymphoma Brother    Lung cancer Brother    Hepatitis C Brother    Hypothyroidism Brother    Hypertension Brother    Depression Brother    Heart disease Other        Grandfather    Social History   Socioeconomic History   Marital status: Divorced    Spouse name: Not on file   Number of children: 3   Years of education: 10 th   Highest education  level: Not on file  Occupational History   Occupation: DISABLED    Employer: UNEMPLOYED  Tobacco Use   Smoking status: Every Day    Packs/day: 2.00    Years: 54.00    Additional pack years: 0.00    Total pack years: 108.00    Types: Cigarettes    Last attempt to quit: 11/20/2012    Years since quitting: 10.1   Smokeless tobacco: Never   Tobacco comments:    2 ppd for many years.     04/13/2022 Patient smokes 1/2 pack daily or more if she is nervous  Substance and Sexual Activity   Alcohol use: No    Alcohol/week: 0.0 standard drinks of alcohol   Drug use: No   Sexual activity: Never  Other Topics Concern   Not on file  Social History Narrative   Patient is single with 3 children, 1 deceased.   Patient is right handed.   Patient has 10 th grade education.   Patient drinks 5 or more cups daily.   Social Determinants of Health   Financial Resource Strain: Low Risk  (09/14/2022)   Overall Financial Resource Strain (CARDIA)    Difficulty of Paying Living Expenses: Not  hard at all  Food Insecurity: No Food Insecurity (09/14/2022)   Hunger Vital Sign    Worried About Running Out of Food in the Last Year: Never true    Ran Out of Food in the Last Year: Never true  Transportation Needs: No Transportation Needs (09/14/2022)   PRAPARE - Administrator, Civil Service (Medical): No    Lack of Transportation (Non-Medical): No  Physical Activity: Inactive (09/14/2022)   Exercise Vital Sign    Days of Exercise per Week: 0 days    Minutes of Exercise per Session: 0 min  Stress: Stress Concern Present (09/14/2022)   [employer] of Occupational Health - Occupational Stress Questionnaire    Feeling of Stress : Rather much  Social Connections: Moderately Isolated (09/14/2022)   Social Connection and Isolation Panel [NHANES]    Frequency of Communication with Friends and Family: More than three times a week    Frequency of Social Gatherings with Friends and Family: Never    Attends Religious Services: More than 4 times per year    Active Member of [employer] or Organizations: No    Attends Banker Meetings: Never    Marital Status: Divorced  Catering manager Violence: Not At Risk (09/14/2022)   Humiliation, Afraid, Rape, and Kick questionnaire    Fear of Current or Ex-Partner: No    Emotionally Abused: No    Physically Abused: No    Sexually Abused: No    Outpatient Medications Prior to Visit  Medication Sig Dispense Refill   albuterol (PROVENTIL) (2.5 MG/3ML) 0.083% nebulizer solution Take 2.5 mg by nebulization every 6 (six) hours as needed for wheezing or shortness of breath.     albuterol (VENTOLIN HFA) 108 (90 Base) MCG/ACT inhaler Inhale 2 puffs into the lungs every 4 (four) hours as needed for wheezing or shortness of breath.     ALPRAZolam (XANAX) 0.5 MG tablet Take 1 tablet (0.5 mg total) by mouth 3 (three) times daily as needed for anxiety. 90 tablet 0   Aspirin-Acetaminophen (GOODYS BODY PAIN PO) Take 1 packet by mouth  daily.     blood glucose meter kit and supplies KIT Dispense based on patient and insurance preference. Use up to four times daily as directed. 1 each 0  clopidogrel (PLAVIX) 75 MG tablet TAKE ONE TABLET BY MOUTH EVERY MORNING 90 tablet 0   dapagliflozin propanediol (FARXIGA) 10 MG TABS tablet Take 1 tablet (10 mg total) by mouth daily. 30 tablet 2   docusate sodium (COLACE) 100 MG capsule Take 100 mg by mouth 2 (two) times daily as needed for mild constipation.     ezetimibe (ZETIA) 10 MG tablet TAKE ONE TABLET BY MOUTH ONCE DAILY 90 tablet 0   fenofibrate 160 MG tablet TAKE ONE TABLET BY MOUTH EVERY MORNING 90 tablet 0   fluticasone (FLONASE) 50 MCG/ACT nasal spray INSTILL 1 SPRAY IN EACH NOSTRIL ONCE DAILY (Patient taking differently: Place 1 spray into both nostrils daily as needed for rhinitis.) 16 g 1   gabapentin (NEURONTIN) 300 MG capsule Take 1 capsule (300 mg total) by mouth 2 (two) times daily. TAKE ONE CAPSULE BY MOUTH EVERYDAY AT BEDTIME 180 capsule 1   glucose blood (ONETOUCH ULTRA) test strip DISPENSE BASED ON PATIENT AND INSURANCE PREFERENCE. USE UP TO FOUR TIMES DAILY AS DIRECTED. 400 strip 3   lamoTRIgine (LAMICTAL) 25 MG tablet Take 2 tablets (50 mg total) by mouth at bedtime. 180 tablet 1   lansoprazole (PREVACID) 30 MG capsule TAKE ONE CAPSULE BY MOUTH BEFORE BREAKFAST 90 capsule 3   Levomilnacipran HCl ER (FETZIMA) 40 MG CP24 Take 40 mg by mouth daily. 90 capsule 0   montelukast (SINGULAIR) 10 MG tablet Take 10 mg by mouth daily.     naloxone (NARCAN) nasal spray 4 mg/0.1 mL SMARTSIG:1 Both Nares Daily     nitroGLYCERIN (NITROSTAT) 0.4 MG SL tablet Place 1 tablet (0.4 mg total) under the tongue every 5 (five) minutes as needed. 25 tablet 1   oxyCODONE-acetaminophen (PERCOCET) 10-325 MG tablet Take 1 tablet by mouth every 6 (six) hours as needed for pain.     rosuvastatin (CRESTOR) 20 MG tablet TAKE ONE TABLET BY MOUTH EVERYDAY AT BEDTIME 90 tablet 0   TRELEGY ELLIPTA  200-62.5-25 MCG/ACT AEPB Inhale 1 puff into the lungs daily.     TRUEplus Lancets 30G MISC 1 each by Does not apply route 2 (two) times daily. E11.69 400 each 3   TRULICITY 4.5 MG/0.5ML SOPN Inject 4.5 mg into the skin once a week. 2 mL 2   valACYclovir (VALTREX) 1000 MG tablet TAKE TWO TABLETS BY MOUTH twice A DAY as needed 20 tablet 2   VASCEPA 1 g capsule TAKE TWO CAPSULES BY MOUTH TWICE DAILY 360 capsule 1   furosemide (LASIX) 20 MG tablet Take 1 tablet (20 mg total) by mouth daily. 90 tablet 0   lisinopril-hydrochlorothiazide (ZESTORETIC) 10-12.5 MG tablet Take 1 tablet by mouth daily.     potassium chloride SA (KLOR-CON M) 20 MEQ tablet Take 1 tablet (20 mEq total) by mouth daily. 90 tablet 1   sulfamethoxazole-trimethoprim (BACTRIM DS) 800-160 MG tablet Take 1 tablet by mouth 2 (two) times daily. 60 tablet 0   Facility-Administered Medications Prior to Visit  Medication Dose Route Frequency Provider Last Rate Last Admin   sodium chloride flush (NS) 0.9 % injection 3 mL  3 mL Intravenous Q12H [name], [name], MD        Allergies  Allergen Reactions   Abilify [Aripiprazole]     confusion   Latex Rash    Review of Systems  Constitutional:  Negative for chills, fatigue and fever.  HENT:  Negative for congestion, ear pain, postnasal drip, rhinorrhea, sinus pressure, sinus pain and sore throat.   Respiratory:  Negative  for cough and shortness of breath.   Cardiovascular:  Negative for chest pain.  Gastrointestinal:  Positive for abdominal pain (if she eats ice it resolves. It happens every day.). Negative for diarrhea and nausea.  Musculoskeletal:  Positive for back pain (across shoulder blades she has tightness and she takes).  Neurological:  Negative for dizziness and headaches.       Objective:        01/02/2023    2:48 PM 12/28/2022    2:45 PM 12/06/2022   10:28 AM  Vitals with BMI  Height 5\' 5"  5\' 5"  5\' 5"   Weight 155 lbs 153 lbs 155 lbs  BMI 25.79 25.46 25.79  Systolic  112 92 791  Diastolic 62 53 64  Pulse 77 73 83    No data found.   Physical Exam Vitals reviewed.  Constitutional:      Appearance: Normal appearance.  HENT:     Right Ear: Tympanic membrane, ear canal and external ear normal.     Left Ear: Tympanic membrane, ear canal and external ear normal.     Nose: Nose normal.     Mouth/Throat:     Pharynx: Oropharynx is clear.  Neck:     Vascular: No carotid bruit.  Cardiovascular:     Rate and Rhythm: Normal rate and regular rhythm.     Heart sounds: Normal heart sounds. No murmur heard. Pulmonary:     Effort: Pulmonary effort is normal. No respiratory distress.     Breath sounds: Normal breath sounds.  Abdominal:     General: Bowel sounds are normal.     Palpations: Abdomen is soft.     Tenderness: There is no abdominal tenderness.  Lymphadenopathy:     Cervical: No cervical adenopathy.  Neurological:     Mental Status: She is alert and oriented to person, place, and time.  Psychiatric:        Mood and Affect: Mood normal.        Behavior: Behavior normal.     Health Maintenance Due  Topic Date Due   DTaP/Tdap/Td (1 - Tdap) Never done   COLONOSCOPY (Pts 45-38yrs Insurance coverage will need to be confirmed)  Never done   MAMMOGRAM  Never done   Zoster Vaccines- Shingrix (1 of 2) Never done   Lung Cancer Screening  03/02/2022   COVID-19 Vaccine (3 - 2023-24 season) 05/25/2022    There are no preventive care reminders to display for this patient.   Lab Results  Component Value Date   TSH 0.605 09/12/2022   Lab Results  Component Value Date   WBC 10.6 01/02/2023   HGB 13.0 01/02/2023   HCT 41.4 01/02/2023   MCV 88 01/02/2023   PLT 327 01/02/2023   Lab Results  Component Value Date   NA 141 01/02/2023   K 5.6 (H) 01/02/2023   CO2 22 01/02/2023   GLUCOSE 99 01/02/2023   BUN 29 (H) 01/02/2023   CREATININE 1.21 (H) 01/02/2023   BILITOT <0.2 01/02/2023   ALKPHOS 55 01/02/2023   AST 27 01/02/2023   ALT 24  01/02/2023   PROT 6.0 01/02/2023   ALBUMIN 4.0 01/02/2023   CALCIUM 9.3 01/02/2023   ANIONGAP 10 10/19/2020   EGFR 47 (L) 01/02/2023   Lab Results  Component Value Date   CHOL 113 09/12/2022   Lab Results  Component Value Date   HDL 40 09/12/2022   Lab Results  Component Value Date   LDLCALC 52 09/12/2022   Lab Results  Component Value Date   TRIG 113 09/12/2022   Lab Results  Component Value Date   CHOLHDL 2.8 09/12/2022   Lab Results  Component Value Date   HGBA1C 7.9 (H) 09/12/2022       Assessment & Plan:  Polyuria Assessment & Plan: Check UA  Orders: -     POCT urinalysis dipstick  Hyperkalemia Assessment & Plan: Hold lisinopril/hydrochlorothiazide.  Stop potassium.     Orders: -     Comprehensive metabolic panel  Other fatigue Assessment & Plan: Check lab  Orders: -     CBC With Diff/Platelet  Type 2 diabetes mellitus with diabetic neuropathy, without long-term current use of insulin Assessment & Plan: Ordered Microalbumin  Orders: -     Microalbumin / creatinine urine ratio  Mixed stress and urge urinary incontinence Assessment & Plan: Start myrbetriq 25 mg daily for your bladder.    Hypertensive heart disease with chronic combined systolic and diastolic congestive heart failure Assessment & Plan: Start torsemide 10 mg daily.  Hold lisinopril/hydrochlorothiazide.     Other orders -     Torsemide; Take 1 tablet (10 mg total) by mouth daily.  Dispense: 90 tablet; Refill: 0     Meds ordered this encounter  Medications   torsemide (DEMADEX) 10 MG tablet    Sig: Take 1 tablet (10 mg total) by mouth daily.    Dispense:  90 tablet    Refill:  0    Orders Placed This Encounter  Procedures   CBC With Diff/Platelet   Comprehensive metabolic panel   Microalbumin / creatinine urine ratio   POCT urinalysis dipstick     Follow-up: Return in about 3 weeks (around 01/23/2023).  An After Visit Summary was printed and given to the  patient.  Blane OharaKirsten Hara Milholland, MD [facility] [phone]

## 2023-01-02 NOTE — Patient Instructions (Addendum)
Start torsemide 10 mg daily.  Stop bactrim ds (trimethoprim sulfa)  Hold lisinopril/hydrochlorothiazide.   Stop potassium.   Start myrbetriq 25 mg daily for your bladder.

## 2023-01-03 ENCOUNTER — Telehealth: Payer: Self-pay

## 2023-01-03 LAB — MICROALBUMIN / CREATININE URINE RATIO
Creatinine, Urine: 48.2 mg/dL
Microalb/Creat Ratio: <6 mg/g creat (ref 0–29)
Microalbumin, Urine: <3 ug/mL

## 2023-01-03 LAB — COMPREHENSIVE METABOLIC PANEL
ALT: 24 IU/L (ref 0–32)
AST: 27 IU/L (ref 0–40)
Albumin/Globulin Ratio: 2 (ref 1.2–2.2)
Albumin: 4 g/dL (ref 3.8–4.8)
Alkaline Phosphatase: 55 IU/L (ref 44–121)
BUN/Creatinine Ratio: 24 (ref 12–28)
BUN: 29 mg/dL — ABNORMAL HIGH (ref 8–27)
Bilirubin Total: <0.2 mg/dL (ref 0.0–1.2)
CO2: 22 mmol/L (ref 20–29)
Calcium: 9.3 mg/dL (ref 8.7–10.3)
Chloride: 104 mmol/L (ref 96–106)
Creatinine, Ser: 1.21 mg/dL — ABNORMAL HIGH (ref 0.57–1.00)
Globulin, Total: 2 g/dL (ref 1.5–4.5)
Glucose: 99 mg/dL (ref 70–99)
Potassium: 5.6 mmol/L — ABNORMAL HIGH (ref 3.5–5.2)
Sodium: 141 mmol/L (ref 134–144)
Total Protein: 6 g/dL (ref 6.0–8.5)
eGFR: 47 mL/min/{1.73_m2} — ABNORMAL LOW (ref >59)

## 2023-01-03 LAB — CBC WITH DIFF/PLATELET
Basophils Absolute: 0.1 10*3/uL (ref 0.0–0.2)
Basos: 1 %
EOS (ABSOLUTE): 0.1 10*3/uL (ref 0.0–0.4)
Eos: 1 %
Hematocrit: 41.4 % (ref 34.0–46.6)
Hemoglobin: 13 g/dL (ref 11.1–15.9)
Immature Grans (Abs): 0.2 10*3/uL — ABNORMAL HIGH (ref 0.0–0.1)
Immature Granulocytes: 2 %
Lymphocytes Absolute: 2.7 10*3/uL (ref 0.7–3.1)
Lymphs: 26 %
MCH: 27.7 pg (ref 26.6–33.0)
MCHC: 31.4 g/dL — ABNORMAL LOW (ref 31.5–35.7)
MCV: 88 fL (ref 79–97)
Monocytes Absolute: 0.9 10*3/uL (ref 0.1–0.9)
Monocytes: 9 %
Neutrophils Absolute: 6.7 10*3/uL (ref 1.4–7.0)
Neutrophils: 61 %
Platelets: 327 10*3/uL (ref 150–450)
RBC: 4.7 x10E6/uL (ref 3.77–5.28)
RDW: 12.3 % (ref 11.7–15.4)
WBC: 10.6 10*3/uL (ref 3.4–10.8)

## 2023-01-03 NOTE — Telephone Encounter (Signed)
Ok. Dr Tome Wilson  

## 2023-01-03 NOTE — Telephone Encounter (Signed)
Left message to call back. Per chart review, patient was seen at PCP office 01/02/23 and PCP advised to hold lisinopril/hctz.   Routing to NL triage as this Clinical research associate is out of the office after today in the event of a return call.

## 2023-01-03 NOTE — Telephone Encounter (Signed)
Patient returned call. Informed her of advice per Dr. [name]. PCP already adjusted meds. She will continue with this plan and f/u with [name] MD on 01/11/23

## 2023-01-03 NOTE — Telephone Encounter (Signed)
Patient came in office for a nurse to help her go through her pill pack to help her remove medications that the provider stop at her visit yesterday. Patient also stated that she is not able to take fetzima, medication makes her dizzy, sleepy and crazy headed. Please advise  Went through her pill pack and help her remove the lisinopril/hydrochlorothiazide and potassium. Lasix and bactrim was not listed on pill pack.

## 2023-01-03 NOTE — Telephone Encounter (Signed)
[name], [name], MD  Lindell Spar, RN Caller: Unspecified [name], 10:51 AM) Was just seen by [name] on April 5 and lisinopril HCT was reduced to 10/12.5 mg daily. If she is having symptoms  of hypotension then she should hold lisinopril HCT and continue to use lasix as needed. If she is not symptomatic then I would continue current dose  [name] [name] MD, [facility]

## 2023-01-05 ENCOUNTER — Encounter: Payer: Self-pay | Admitting: Family Medicine

## 2023-01-05 DIAGNOSIS — E875 Hyperkalemia: Secondary | ICD-10-CM | POA: Insufficient documentation

## 2023-01-05 NOTE — Assessment & Plan Note (Signed)
Start torsemide 10 mg daily.  Hold lisinopril/hydrochlorothiazide.

## 2023-01-05 NOTE — Assessment & Plan Note (Signed)
Start myrbetriq 25 mg daily for your bladder.

## 2023-01-05 NOTE — Assessment & Plan Note (Signed)
Ordered Microalbumin

## 2023-01-05 NOTE — Assessment & Plan Note (Addendum)
Check UA 

## 2023-01-05 NOTE — Assessment & Plan Note (Signed)
Check lab

## 2023-01-05 NOTE — Assessment & Plan Note (Signed)
Hold lisinopril/hydrochlorothiazide.  Stop potassium.

## 2023-01-06 ENCOUNTER — Other Ambulatory Visit: Payer: Self-pay | Admitting: Family Medicine

## 2023-01-06 DIAGNOSIS — E1142 Type 2 diabetes mellitus with diabetic polyneuropathy: Secondary | ICD-10-CM

## 2023-01-07 ENCOUNTER — Other Ambulatory Visit: Payer: Self-pay

## 2023-01-07 ENCOUNTER — Telehealth: Payer: Self-pay

## 2023-01-07 DIAGNOSIS — R5383 Other fatigue: Secondary | ICD-10-CM

## 2023-01-07 NOTE — Progress Notes (Signed)
Care Management & Coordination Services Pharmacy Team  Reason for Encounter: Medication coordination and delivery  Contacted patient to discuss medications and coordinate delivery from [facility].  Spoke with patient on 01/07/2023   Cycle dispensing form sent to Sonya Small for review.   Last adherence delivery date:12/20/22      Patient is due for next adherence delivery on: 01/18/23  This delivery to include: Adherence Packaging  30 Days  Montelukast 10mg  1 B Vascepa 1gm 2 B 2 EM Ezetimibe 10mg  1 B Farxiga 10mg  1 BB Lansoprazole 30mg  1 BB Fenofibrate 160mg  1 B Rosuvastatin 20mg  1 BT Torsemide 10mg  1 BT Clopidogrel 75mg  1 B Lamotrigine 25mg  2 BT Gabapentin 300mg  1 B 1 BT       Xanax 0.5mg - 1 three times daily (Bottles)  Trulicity 4.5mg - Inject 4.5 once a week  Albuterol Nebulizer 0.083% Solution-Take 2.5 mg by nebulization every 6 (six) hours as needed for wheezing or shortness of breath.  Valtrex 1000mg -TAKE TWO TABLETS BY MOUTH twice A DAY as needed   Patient declined the following medications this month: Albuterol Inhaler-Pt has enough supply Docusate Sodium 100mg -Not taking currently Trelegy Ellipta 200-62.5-25mcg- Pt has enough supply Flonase Nasal Spray- Plenty of supply only uses as needed  Nitroglycerin 0.4- Has enough. Only uses prn  Percocet 10-325mg - No longer taking Test Strips/Lancets- Gets from [facility]. Cannot be billed through her insurance   Fetzima-Side Effects Lisinopril/HCTZ- This was held due to Low BP Potassium-D/C by provider Lasix-D/C by provider, change of therapy  Refills requested from providers include: Gabapentin 300mg   Xanax 0.5mg  Albuterol Nebulizer 0.083% Solution  Confirmed delivery date of 01/18/23, advised patient that pharmacy will contact them the morning of delivery.   Any concerns about your medications? No  How often do you forget or accidentally miss a dose? Never  Do you use a pillbox? No  Is patient in packaging  Yes  If yes  What is the date on your next pill pack? 01/08/23  Any concerns or issues with your packaging? No concerns   Recent blood pressure readings are as follows: 01/07/23 115/65 75, 01/02/23 112/62, 12/28/22 92/53  Recent blood glucose readings are as follows: 01/07/23 111, 01/06/23 141, 01/04/23 122, 01/05/23 121   Chart review: Recent office visits:  01/03/23 Blane Ohara MD. Telephone encounter. Patient also stated that she is not able to take fetzima, medication makes her dizzy, sleepy and crazy headed. Went through her pill pack and help her remove the lisinopril/hydrochlorothiazide and potassium. Lasix and bactrim was not listed on pill pack.  01/02/23 Blane Ohara MD. Seen for fatigue. D/C Furosemide 20mg , Lisinopril/HCTZ 10-12.5mg , Potassium Chloride 20mg  and bactrim. Started Torsemide 10mg  daily.   Recent consult visits:  None  Hospital visits:  None  Medications: Outpatient Encounter Medications as of 01/07/2023  Medication Sig   albuterol (PROVENTIL) (2.5 MG/3ML) 0.083% nebulizer solution Take 2.5 mg by nebulization every 6 (six) hours as needed for wheezing or shortness of breath.   albuterol (VENTOLIN HFA) 108 (90 Base) MCG/ACT inhaler Inhale 2 puffs into the lungs every 4 (four) hours as needed for wheezing or shortness of breath.   ALPRAZolam (XANAX) 0.5 MG tablet Take 1 tablet (0.5 mg total) by mouth 3 (three) times daily as needed for anxiety.   Aspirin-Acetaminophen (GOODYS BODY PAIN PO) Take 1 packet by mouth daily.   blood glucose meter kit and supplies KIT Dispense based on patient and insurance preference. Use up to four times daily as directed.   clopidogrel (PLAVIX) 75  MG tablet TAKE ONE TABLET BY MOUTH EVERY MORNING   docusate sodium (COLACE) 100 MG capsule Take 100 mg by mouth 2 (two) times daily as needed for mild constipation.   ezetimibe (ZETIA) 10 MG tablet TAKE ONE TABLET BY MOUTH ONCE DAILY   FARXIGA 10 MG TABS tablet TAKE ONE TABLET BY MOUTH BEFORE  BREAKFAST   fenofibrate 160 MG tablet TAKE ONE TABLET BY MOUTH EVERY MORNING   fluticasone (FLONASE) 50 MCG/ACT nasal spray INSTILL 1 SPRAY IN EACH NOSTRIL ONCE DAILY (Patient taking differently: Place 1 spray into both nostrils daily as needed for rhinitis.)   gabapentin (NEURONTIN) 300 MG capsule Take 1 capsule (300 mg total) by mouth 2 (two) times daily. TAKE ONE CAPSULE BY MOUTH EVERYDAY AT BEDTIME   glucose blood (ONETOUCH ULTRA) test strip DISPENSE BASED ON PATIENT AND INSURANCE PREFERENCE. USE UP TO FOUR TIMES DAILY AS DIRECTED.   lamoTRIgine (LAMICTAL) 25 MG tablet Take 2 tablets (50 mg total) by mouth at bedtime.   lansoprazole (PREVACID) 30 MG capsule TAKE ONE CAPSULE BY MOUTH BEFORE BREAKFAST   Levomilnacipran HCl ER (FETZIMA) 40 MG CP24 Take 40 mg by mouth daily.   montelukast (SINGULAIR) 10 MG tablet Take 10 mg by mouth daily.   naloxone (NARCAN) nasal spray 4 mg/0.1 mL SMARTSIG:1 Both Nares Daily   nitroGLYCERIN (NITROSTAT) 0.4 MG SL tablet Place 1 tablet (0.4 mg total) under the tongue every 5 (five) minutes as needed.   oxyCODONE-acetaminophen (PERCOCET) 10-325 MG tablet Take 1 tablet by mouth every 6 (six) hours as needed for pain.   rosuvastatin (CRESTOR) 20 MG tablet TAKE ONE TABLET BY MOUTH EVERYDAY AT BEDTIME   torsemide (DEMADEX) 10 MG tablet Take 1 tablet (10 mg total) by mouth daily.   TRELEGY ELLIPTA 200-62.5-25 MCG/ACT AEPB Inhale 1 puff into the lungs daily.   TRUEplus Lancets 30G MISC 1 each by Does not apply route 2 (two) times daily. E11.69   TRULICITY 4.5 MG/0.5ML SOPN Inject 4.5 mg into the skin once a week.   valACYclovir (VALTREX) 1000 MG tablet TAKE TWO TABLETS BY MOUTH twice A DAY as needed   VASCEPA 1 g capsule TAKE TWO CAPSULES BY MOUTH TWICE DAILY   Facility-Administered Encounter Medications as of 01/07/2023  Medication   sodium chloride flush (NS) 0.9 % injection 3 mL   BP Readings from Last 3 Encounters:  01/02/23 112/62  12/28/22 (!) 92/53   12/06/22 118/64    Pulse Readings from Last 3 Encounters:  01/02/23 77  12/28/22 73  12/06/22 83    Lab Results  Component Value Date/Time   HGBA1C 7.9 (H) 09/12/2022 09:17 AM   HGBA1C 8.5 (H) 06/11/2022 10:51 AM   Lab Results  Component Value Date   CREATININE 1.21 (H) 01/02/2023   BUN 29 (H) 01/02/2023   GFRNONAA >60 10/19/2020   GFRAA 97 10/03/2020   NA 141 01/02/2023   K 5.6 (H) 01/02/2023   CALCIUM 9.3 01/02/2023   CO2 22 01/02/2023     [name], CMA Clinical Pharmacist Assistant  [phone]

## 2023-01-08 MED ORDER — GABAPENTIN 300 MG PO CAPS: 300.0000 mg | ORAL_CAPSULE | Freq: Two times a day (BID) | ORAL | 1 refills | Status: DC

## 2023-01-08 MED ORDER — ALPRAZOLAM 0.5 MG PO TABS
0.5000 mg | ORAL_TABLET | Freq: Three times a day (TID) | ORAL | 1 refills | Status: DC | PRN
Start: 2023-01-08 — End: 2023-03-11

## 2023-01-08 MED ORDER — ALBUTEROL SULFATE (2.5 MG/3ML) 0.083% IN NEBU: 2.5000 mg | INHALATION_SOLUTION | Freq: Four times a day (QID) | RESPIRATORY_TRACT | 2 refills | Status: AC | PRN

## 2023-01-08 NOTE — Telephone Encounter (Signed)
Compliant on meds 

## 2023-01-10 NOTE — Progress Notes (Signed)
Cardiology Office Note    Date:  01/11/2023   ID:  Sonya, Bellissimo 12-13-1950, MRN [id number]  PCP:  Blane Ohara, MD  Cardiologist:  Dr. [name]  No chief complaint on file.   History of Present Illness:  Sonya Small is a 72 y.o. female with PMH of HTN, HLD, OSA, PAD, CVA, chronic diastolic heart failure and CAD.  Patient had a NSTEMI in March 2010 and underwent DES to left circumflex.  She had another NSTEMI in May 2011 secondary to thrombotic RCA lesion, cardiac catheterization showed nonobstructive disease, medical therapy was recommended.  She was admitted for recurrent chest pain in January 2014, Myoview was negative.  She was admitted for acute CVA in October 2015 after presenting with slurred speech and facial drooping.  MRI showed acute right MCA infarct involving basal ganglia and periventricular white matter.  She was started on aspirin along with Plavix.  Myoview obtained in August 2017 showed no ischemia, normal EF.   ABI obtained in July 2018 was normal.    She was seen by Azalee Course PA-C on 03/13/2018  with chest pain.  A Myoview study on 03/25/2018 which showed EF 52%, small sized moderate intensity fixed apical perfusion defect likely attenuation artifact was seen, no reversible ischemia otherwise.  Overall this is a low risk stress test.  Seen again on 05/22/2018, he was having a lot of dizziness. She was noted to be taking different doses of medication than what was prescribed. BP was stable. Polypharmacy with use of Xanax, oxycodone, Tizanidiine, Zoloft. Dizziness felt to be more related to medication than to a vascular issue. Carotid dopplers were checked and were OK.  She was admitted in May 2020 with PNA and respiratory failure requiring ventilator support. Treated with antibiotics and breathing treatments with improvement. Still smoking.  She was seen in the ED on 10/19/20 with acute mid thoracic pain. Started left scapula and moved to the right. She states Ntg did help.  Troponin was normal x 2. Ecg showed mild ST depression in leads V5-6. CXR normal. Other labs normal. Pain lasted about 30 minutes.  Since then her pain has resolved.    When seen last year she noted that she has had pain across her shoulder blades and upper back for several months. It is worse with activity. She does get relief with pain medication and Ntg.  We repeated a Myoview study and it was low risk. Unchanged from prior. Continued on medical therapy.  When seen in June she ws having worsening chest pain. She has been under a lot of stress. She was caring for her brother and he died this past month with lung CA. She has complained of chest pain since then. Worse when stressed. Some relief with sl Ntg but she doesn't like to take. She notes pain across her anterior chest and into her back. Doesn't feel well. Has trouble staying awake. Fingers are numb. Notes toes are blue.   She underwent cardiac cath in October 2023 which showed no significant obstructive CAD. She also had follow up LE arterial vascular dopplers which were stable with good ABIs.   She was seen by Bernadene Person NP on 12/28/22. Noted BP was low. Lisinopril HCT was cut in half. Later when seen by Dr Sedalia Muta this was discontinued. Now on torsemide 10 mg daily for leg swelling. Wearing compression hose. Notes she is sleeping all the time. Has seen pulmonary in [place]. Had sleep study per her report and they recommended  CPAP which she could not tolerate. They are now recommending oxygen at night now. She is still smoking.    Past Medical History:  Diagnosis Date   Anxiety    CAD (coronary artery disease)    a. NSTEMI 11/2008 s/p DES to LCx (3.0x12 Xience); b. NSTEMI 01/2010 secondary to thrombotic RCA lesion (non-obstructive)-->med rx (integrilin x 24 hrs + plavix); c. 09/2012 negative Myoview.   Chronic diastolic CHF (congestive heart failure)    a. 06/2014 Echo: EF 55-60%, no rwma, Gr1 DD, mild AI.   Depression    Dizziness    Drug  induced constipation    Ganglion cyst of left foot 01/17/2021   Generalized hyperhidrosis    GERD (gastroesophageal reflux disease)    Headache    Hyperlipidemia    Hypertensive heart disease    Lumbar disc disease    Metabolic encephalopathy    Mixed hyperlipidemia    Myocardial infarction    Obstructive sleep apnea    OP (osteoporosis)    Osteoarthritis    Osteoporosis    Other malaise    Overweight(278.02)    PAD (peripheral artery disease)    a. Emboli to R foot 2010 from partially occlusive lesion in R EIA, s/p stenting. - followed by Dr. Arbie Cookey;  b. 10/2015 ABIs: R 1.03, L 0.97.   Restless leg    Sleep apnea    Stroke    TIA (transient ischemic attack)    Tobacco abuse    Urge incontinence     Past Surgical History:  Procedure Laterality Date   CYST EXCISION Left 01/2021   left foot   EYE SURGERY     at age 65   hysterectomy -age 63     ILIAC ARTERY STENT     RIGHT ILIAC STENT   KNEE ARTHROSCOPY     LEFT HEART CATH AND CORONARY ANGIOGRAPHY N/A 03/05/2022   Procedure: LEFT HEART CATH AND CORONARY ANGIOGRAPHY;  Surgeon: Corky Crafts, MD;  Location: [facility];  Service: Cardiovascular;  Laterality: N/A;   LITHOTRIPSY Left 12/2020   LUMBAR LAMINECTOMY     TUBAL LIGATION      Current Medications: Outpatient Medications Prior to Visit  Medication Sig Dispense Refill   albuterol (PROVENTIL) (2.5 MG/3ML) 0.083% nebulizer solution Take 3 mLs (2.5 mg total) by nebulization every 6 (six) hours as needed for wheezing or shortness of breath. 75 mL 2   albuterol (VENTOLIN HFA) 108 (90 Base) MCG/ACT inhaler Inhale 2 puffs into the lungs every 4 (four) hours as needed for wheezing or shortness of breath.     ALPRAZolam (XANAX) 0.5 MG tablet Take 1 tablet (0.5 mg total) by mouth 3 (three) times daily as needed for anxiety. 90 tablet 1   Aspirin-Acetaminophen (GOODYS BODY PAIN PO) Take 1 packet by mouth daily.     blood glucose meter kit and supplies KIT Dispense  based on patient and insurance preference. Use up to four times daily as directed. 1 each 0   clopidogrel (PLAVIX) 75 MG tablet TAKE ONE TABLET BY MOUTH EVERY MORNING 90 tablet 0   docusate sodium (COLACE) 100 MG capsule Take 100 mg by mouth 2 (two) times daily as needed for mild constipation.     ezetimibe (ZETIA) 10 MG tablet TAKE ONE TABLET BY MOUTH ONCE DAILY 90 tablet 0   FARXIGA 10 MG TABS tablet TAKE ONE TABLET BY MOUTH BEFORE BREAKFAST 30 tablet 5   fenofibrate 160 MG tablet TAKE ONE TABLET BY MOUTH EVERY  MORNING 90 tablet 0   fluticasone (FLONASE) 50 MCG/ACT nasal spray INSTILL 1 SPRAY IN EACH NOSTRIL ONCE DAILY (Patient taking differently: Place 1 spray into both nostrils daily as needed for rhinitis.) 16 g 1   gabapentin (NEURONTIN) 300 MG capsule Take 1 capsule (300 mg total) by mouth 2 (two) times daily. TAKE ONE CAPSULE BY MOUTH EVERYDAY AT BEDTIME 180 capsule 1   glucose blood (ONETOUCH ULTRA) test strip DISPENSE BASED ON PATIENT AND INSURANCE PREFERENCE. USE UP TO FOUR TIMES DAILY AS DIRECTED. 400 strip 3   lamoTRIgine (LAMICTAL) 25 MG tablet Take 2 tablets (50 mg total) by mouth at bedtime. 180 tablet 1   lansoprazole (PREVACID) 30 MG capsule TAKE ONE CAPSULE BY MOUTH BEFORE BREAKFAST 90 capsule 3   Levomilnacipran HCl ER (FETZIMA) 40 MG CP24 Take 40 mg by mouth daily. 90 capsule 0   montelukast (SINGULAIR) 10 MG tablet Take 10 mg by mouth daily.     naloxone (NARCAN) nasal spray 4 mg/0.1 mL SMARTSIG:1 Both Nares Daily     nitroGLYCERIN (NITROSTAT) 0.4 MG SL tablet Place 1 tablet (0.4 mg total) under the tongue every 5 (five) minutes as needed. 25 tablet 1   oxyCODONE-acetaminophen (PERCOCET) 10-325 MG tablet Take 1 tablet by mouth every 6 (six) hours as needed for pain.     rosuvastatin (CRESTOR) 20 MG tablet TAKE ONE TABLET BY MOUTH EVERYDAY AT BEDTIME 90 tablet 0   torsemide (DEMADEX) 10 MG tablet Take 1 tablet (10 mg total) by mouth daily. 90 tablet 0   TRELEGY ELLIPTA  200-62.5-25 MCG/ACT AEPB Inhale 1 puff into the lungs daily.     TRUEplus Lancets 30G MISC 1 each by Does not apply route 2 (two) times daily. E11.69 400 each 3   TRULICITY 4.5 MG/0.5ML SOPN Inject 4.5 mg into the skin once a week. 2 mL 2   valACYclovir (VALTREX) 1000 MG tablet TAKE TWO TABLETS BY MOUTH twice A DAY as needed 20 tablet 2   VASCEPA 1 g capsule TAKE TWO CAPSULES BY MOUTH TWICE DAILY 360 capsule 1   Facility-Administered Medications Prior to Visit  Medication Dose Route Frequency Provider Last Rate Last Admin   sodium chloride flush (NS) 0.9 % injection 3 mL  3 mL Intravenous Q12H [name], Rey Dansby M, MD         Allergies:   Abilify [aripiprazole] and Latex   Social History   Socioeconomic History   Marital status: Divorced    Spouse name: Not on file   Number of children: 3   Years of education: 10 th   Highest education level: Not on file  Occupational History   Occupation: DISABLED    Employer: UNEMPLOYED  Tobacco Use   Smoking status: Every Day    Packs/day: 2.00    Years: 54.00    Additional pack years: 0.00    Total pack years: 108.00    Types: Cigarettes    Last attempt to quit: 11/20/2012    Years since quitting: 10.1   Smokeless tobacco: Never   Tobacco comments:    2 ppd for many years.     04/13/2022 Patient smokes 1/2 pack daily or more if she is nervous  Substance and Sexual Activity   Alcohol use: No    Alcohol/week: 0.0 standard drinks of alcohol   Drug use: No   Sexual activity: Never  Other Topics Concern   Not on file  Social History Narrative   Patient is single with 3 children, 1 deceased.  Patient is right handed.   Patient has 10 th grade education.   Patient drinks 5 or more cups daily.   Social Determinants of Health   Financial Resource Strain: Low Risk  (09/14/2022)   Overall Financial Resource Strain (CARDIA)    Difficulty of Paying Living Expenses: Not hard at all  Food Insecurity: No Food Insecurity (09/14/2022)   Hunger  Vital Sign    Worried About Running Out of Food in the Last Year: Never true    Ran Out of Food in the Last Year: Never true  Transportation Needs: No Transportation Needs (09/14/2022)   PRAPARE - Administrator, Civil Service (Medical): No    Lack of Transportation (Non-Medical): No  Physical Activity: Inactive (09/14/2022)   Exercise Vital Sign    Days of Exercise per Week: 0 days    Minutes of Exercise per Session: 0 min  Stress: Stress Concern Present (09/14/2022)   [employer] of Occupational Health - Occupational Stress Questionnaire    Feeling of Stress : Rather much  Social Connections: Moderately Isolated (09/14/2022)   Social Connection and Isolation Panel [NHANES]    Frequency of Communication with Friends and Family: More than three times a week    Frequency of Social Gatherings with Friends and Family: Never    Attends Religious Services: More than 4 times per year    Active Member of [employer] or Organizations: No    Attends Engineer, structural: Never    Marital Status: Divorced     Family History:  The patient's family history includes Alzheimer's disease in her mother; Depression in her brother; Heart disease in an other family member; Hepatitis C in her brother; Hodgkin's lymphoma in her brother; Hypertension in her brother; Hypothyroidism in her brother; Lung cancer in her brother.   ROS:   Please see the history of present illness.    ROS All other systems reviewed and are negative.   PHYSICAL EXAM:   VS:  BP 107/67 (BP Location: Left Arm, Patient Position: Sitting, Cuff Size: Normal)   Pulse 82   Ht  (1.651 m)   Wt 158 lb (71.7 kg)   SpO2 98%   BMI 26.29 kg/m    GEN: Well nourished, well developed, in no acute distress  HEENT: normal  Neck: no JVD, carotid bruits, or masses Cardiac: RRR; no murmurs, rubs, or gallops,no edema. Pedal pulses are palpable but toes are somewhat dusky on plantar side.  Respiratory:  clear to  auscultation bilaterally, normal work of breathing GI: soft, nontender, nondistended, + BS MS: no deformity or atrophy  Legs: compression hose in place. Mild edema.  Skin: warm and dry, no rash Neuro:  Alert and Oriented x 3, Strength and sensation are intact Psych: euthymic mood, full affect  Wt Readings from Last 3 Encounters:  01/11/23 158 lb (71.7 kg)  01/02/23 155 lb (70.3 kg)  12/28/22 153 lb (69.4 kg)      Studies/Labs Reviewed:   EKG:  EKG is not ordered today    Recent Labs: 03/16/2022: Magnesium 2.3 09/12/2022: TSH 0.605 12/28/2022: BNP 12.1 01/02/2023: ALT 24; BUN 29; Creatinine, Ser 1.21; Hemoglobin 13.0; Platelets 327; Potassium 5.6; Sodium 141   Lipid Panel    Component Value Date/Time   CHOL 113 09/12/2022 0917   TRIG 113 09/12/2022 0917   HDL 40 09/12/2022 0917   CHOLHDL 2.8 09/12/2022 0917   CHOLHDL 5.0 07/13/2014 0108   VLDL 42 (H) 07/13/2014 0108   LDLCALC 52  09/12/2022 0917   Labs dated 06/17/18: CBC normal except for elevated WBC 13.3.  Creatinine 1.14. Potassium 5.5.  Dated 05/23/18: cholesterol 142, triglycerides 212, HDL 35, LDL 65.  Dated 07/29/18: Normal chemistries and CBC.  Dated 09/02/19: cholesterol 165, triglycerides 146, HDL 49, LDL 62. A1c 7%. Creatinine 0.56. potassium 3.3. CBC and TSH normal.  Additional studies/ records that were reviewed today include:   Myoview 03/25/2018 The left ventricular ejection fraction is mildly decreased (45-54%). Nuclear stress EF: 52%. No T wave inversion was noted during stress. There was no ST segment deviation noted during stress. Defect 1: There is a small defect of moderate severity. This is a low risk study.   Small size, moderate intensity fixed apical perfusion defect, likely attenuation artifact. No reversible ischemia. LVEF 52% with normal wall motion. This is a low risk study.  Myoview 08/22/21: Study Highlights      Findings are consistent with no prior ischemia. The study is low risk.   No  ST deviation was noted.   LV perfusion is abnormal. Defect 1: There is a small defect with mild reduction in uptake present in the apical apex location(s) that is fixed. There is normal wall motion in the defect area. Consistent with artifact caused by breast attenuation.   Left ventricular function is normal. End diastolic cavity size is normal. End systolic cavity size is normal.   Prior study available for comparison from 03/25/2018. No changes compared to prior study. LVEF 52%, fixed apical attenuation artifact   Small size, mild intensity fixed apical perfusion defect, likely attenuation artifact. No reversible ischemia. LVEF 52% with normal wall motion. This is a low risk study. Compared to a prior study in 2019, there are no changes (the apical perfusion defect was previously reported) - LVEF is unchanged.   Cardiac cath 03/05/22:   LEFT HEART CATH AND CORONARY ANGIOGRAPHY   Conclusion      1st Mrg lesion is 25% stenosed.   Previously placed Mid Cx stent of unknown type is  widely patent.   The left ventricular systolic function is normal.   LV end diastolic pressure is mildly elevated.   The left ventricular ejection fraction is 45-50% by visual estimate.  Apical hypokinesis consistent with Takotsubo cardiomyopathy.   There is no aortic valve stenosis.   No significant coronary artery disease.  She does have apical hypokinesis which is consistent with a Takotsubo cardiomyopathy pattern.  She has been under a lot of stress of late.  The apical hypokinesis is minimal so I suspect her Takotsubo is in the process of resolving.  Continue aggressive blood pressure control.  Consider echocardiogram in 6 weeks.   LVEDP was mildly elevated.  Could consider short-term diuretic as well if she has significant shortness of breath.  Coronary Diagrams  Diagnostic Dominance: Right  Intervention   ASSESSMENT:    1. Coronary artery disease involving native coronary artery of native heart without  angina pectoris   2. Chronic diastolic heart failure   3. Essential hypertension   4. Tobacco abuse   5. OSA (obstructive sleep apnea)       PLAN:  In order of problems listed above:  CAD: remote stent of mid LCx in 2010 with 3.0 x 12 DES. In 2011 had NSTEMI related to thrombotic event in distal RCA- nonobstructive. Myoview in November 2022  was normal.  Cardiac cath in June 2023  was unremarkable. Continue aspirin and Plavix.   Hypertension: Blood pressure is well controlled. Now on no  antihypertensive due to low BP. Agree with stopping lisinopril HCT.   Carotid artery disease: She has a history of mild carotid artery disease.  LE edema multifactorial with Chronic diastolic heart failure and venous insufficiency. Continue torsemide 10 mg daily. Sodium restriction. Elevation of feet, compression hose. Also on Farxiga.   5.   Dyslipidemia. On Crestor and Vascepa. LDL 82.   6.   Tobacco abuse. Recommend smoking cessation.   7.  PAD. Stable LE vascular studies.   8.  OSA plan nocturnal oxygen. Per pulmonary   Will follow up 6 months.     Medication Adjustments/Labs and Tests Ordered: Current medicines are reviewed at length with the patient today.  Concerns regarding medicines are outlined above.  Medication changes, Labs and Tests ordered today are listed in the Patient Instructions below. There are no Patient Instructions on file for this visit.    Signed, Anuj Summons [name], MD  01/11/2023 2:39 PM    [facility] [address] [place], [place], [place]  [address] Phone: [phone]; Fax: [phone]

## 2023-01-11 ENCOUNTER — Ambulatory Visit: Payer: Medicare Other | Attending: Cardiology | Admitting: Cardiology

## 2023-01-11 ENCOUNTER — Encounter: Payer: Self-pay | Admitting: Cardiology

## 2023-01-11 ENCOUNTER — Ambulatory Visit: Payer: Medicare Other | Admitting: Cardiology

## 2023-01-11 VITALS — BP 107/67 | HR 82 | Ht 65.0 in | Wt 158.0 lb

## 2023-01-11 DIAGNOSIS — I10 Essential (primary) hypertension: Secondary | ICD-10-CM

## 2023-01-11 DIAGNOSIS — G4733 Obstructive sleep apnea (adult) (pediatric): Secondary | ICD-10-CM | POA: Diagnosis present

## 2023-01-11 DIAGNOSIS — I5032 Chronic diastolic (congestive) heart failure: Secondary | ICD-10-CM

## 2023-01-11 DIAGNOSIS — Z720 Tobacco use: Secondary | ICD-10-CM

## 2023-01-11 DIAGNOSIS — I2510 Atherosclerotic heart disease of native coronary artery without angina pectoris: Secondary | ICD-10-CM

## 2023-01-11 NOTE — Patient Instructions (Signed)
Medication Instructions:  Continue same medications *If you need a refill on your cardiac medications before your next appointment, please call your pharmacy*   Lab Work: None ordered   Testing/Procedures: None ordered   Follow-Up: At [facility], you and your health needs are our priority.  As part of our continuing mission to provide you with exceptional heart care, we have created designated Provider Care Teams.  These Care Teams include your primary Cardiologist (physician) and Advanced Practice Providers (APPs -  Physician Assistants and Nurse Practitioners) who all work together to provide you with the care you need, when you need it.  We recommend signing up for the patient portal called "MyChart".  Sign up information is provided on this After Visit Summary.  MyChart is used to connect with patients for Virtual Visits (Telemedicine).  Patients are able to view lab/test results, encounter notes, upcoming appointments, etc.  Non-urgent messages can be sent to your provider as well.   To learn more about what you can do with MyChart, go to [email or web address].    Your next appointment:  6 months     Call in May to schedule Oct appointment    Provider:  Dr.Jordan    

## 2023-01-16 ENCOUNTER — Other Ambulatory Visit: Payer: Self-pay

## 2023-01-16 MED ORDER — MIRABEGRON ER 25 MG PO TB24: 25.0000 mg | ORAL_TABLET | Freq: Every day | ORAL | 2 refills | Status: DC

## 2023-01-19 ENCOUNTER — Other Ambulatory Visit: Payer: Self-pay | Admitting: Family Medicine

## 2023-01-22 ENCOUNTER — Ambulatory Visit (INDEPENDENT_AMBULATORY_CARE_PROVIDER_SITE_OTHER): Payer: Medicare Other | Admitting: Family Medicine

## 2023-01-22 ENCOUNTER — Encounter: Payer: Self-pay | Admitting: Family Medicine

## 2023-01-22 VITALS — BP 136/64 | HR 100 | Temp 97.1°F | Resp 16 | Ht 65.0 in | Wt 160.8 lb

## 2023-01-22 DIAGNOSIS — R300 Dysuria: Secondary | ICD-10-CM | POA: Diagnosis not present

## 2023-01-22 DIAGNOSIS — I110 Hypertensive heart disease with heart failure: Secondary | ICD-10-CM | POA: Diagnosis not present

## 2023-01-22 DIAGNOSIS — E782 Mixed hyperlipidemia: Secondary | ICD-10-CM

## 2023-01-22 DIAGNOSIS — B3731 Acute candidiasis of vulva and vagina: Secondary | ICD-10-CM

## 2023-01-22 DIAGNOSIS — I2510 Atherosclerotic heart disease of native coronary artery without angina pectoris: Secondary | ICD-10-CM

## 2023-01-22 DIAGNOSIS — R600 Localized edema: Secondary | ICD-10-CM

## 2023-01-22 DIAGNOSIS — N3946 Mixed incontinence: Secondary | ICD-10-CM

## 2023-01-22 DIAGNOSIS — Z794 Long term (current) use of insulin: Secondary | ICD-10-CM

## 2023-01-22 DIAGNOSIS — E1140 Type 2 diabetes mellitus with diabetic neuropathy, unspecified: Secondary | ICD-10-CM | POA: Diagnosis not present

## 2023-01-22 DIAGNOSIS — I5042 Chronic combined systolic (congestive) and diastolic (congestive) heart failure: Secondary | ICD-10-CM

## 2023-01-22 LAB — POCT URINALYSIS DIP (CLINITEK)
Bilirubin, UA: NEGATIVE
Blood, UA: NEGATIVE
Glucose, UA: 100 mg/dL — AB
Ketones, POC UA: NEGATIVE mg/dL
Leukocytes, UA: NEGATIVE
Nitrite, UA: NEGATIVE
POC PROTEIN,UA: NEGATIVE
Spec Grav, UA: >=1.03 — AB (ref 1.010–1.025)
Urobilinogen, UA: 0.2 E.U./dL
pH, UA: 5.5 (ref 5.0–8.0)

## 2023-01-22 MED ORDER — TORSEMIDE 20 MG PO TABS
20.0000 mg | ORAL_TABLET | Freq: Every day | ORAL | 2 refills | Status: DC
Start: 2023-01-22 — End: 2023-03-01

## 2023-01-22 MED ORDER — FLUCONAZOLE 150 MG PO TABS
150.0000 mg | ORAL_TABLET | Freq: Once | ORAL | 0 refills | Status: AC
Start: 2023-01-22 — End: 2023-01-22

## 2023-01-22 MED ORDER — MIRABEGRON ER 50 MG PO TB24
50.0000 mg | ORAL_TABLET | Freq: Every day | ORAL | 2 refills | Status: DC
Start: 2023-01-22 — End: 2023-04-15

## 2023-01-22 MED ORDER — VALSARTAN 40 MG PO TABS
40.0000 mg | ORAL_TABLET | Freq: Every day | ORAL | 0 refills | Status: DC
Start: 2023-01-22 — End: 2023-01-29

## 2023-01-22 MED ORDER — TRULICITY 4.5 MG/0.5ML ~~LOC~~ SOAJ: 4.5000 mg | SUBCUTANEOUS | 2 refills | Status: DC

## 2023-01-22 NOTE — Progress Notes (Unsigned)
Subjective:  Patient ID: Sonya Small, female    DOB: [birth year]  Age: 72 y.o. MRN: [id number]  Chief Complaint  Patient presents with   Edema   Urinary Frequency   HPI Patient presents for follow-up after changes to medications.  We discontinued her Lasix and then she had significant increase in her swelling.  I had held Lasix due to polyuria.  I also started her on myrbetriq 25 mg daily. This has helped decrease her frequency and incontinence. Unfortunately her edema has increased  Diabetes: sugars are up. 160-200. She has been out of the Trulicity.  She has been unable to get this.  She is on farxiga, but is having vaginal itching and urinary frequency which is multifactorial.  Hypertensive heart disease: history of stents and takotsubo's. Patient is on torsemide 10 mg daily. No other bp medicines. On plavix 75 mg daily, crestor 20 mg daily. Has NTG SL  Hyperlipidemia: on vascepa 1 gm 2 capsules twice daily, rosuvastatin 20 mg before bed, fenofibrate 160 mg once daily, and Zetia 10 mg daily.      01/22/2023    8:47 AM 09/14/2022    9:02 AM 08/28/2022    1:56 PM 01/24/2022    9:16 AM 10/18/2021   10:44 PM  Depression screen PHQ 2/9  Decreased Interest 0 3 3 2 3   Down, Depressed, Hopeless 3 3 3 3 2   PHQ - 2 Score 3 6 6 5 5   Altered sleeping 3 0 3 2 3   Tired, decreased energy 3 3 3 3 3   Change in appetite 3 0 1 3 0  Feeling bad or failure about yourself  1 0 0 3 2  Trouble concentrating 3 1 0 2 3  Moving slowly or fidgety/restless 3 0 3 0 2  Suicidal thoughts 0 0 0 0 0  PHQ-9 Score 19 10 16 18 18   Difficult doing work/chores Very difficult Somewhat difficult Very difficult Somewhat difficult Somewhat difficult         03/05/2022    7:40 AM 09/12/2022   11:08 AM 09/14/2022    9:03 AM 12/06/2022   10:43 AM 01/22/2023    8:46 AM  Fall Risk  Falls in the past year?  0 1 0 1  Was there an injury with Fall?  0 0 0   Fall Risk Category Calculator  0 1 0   Fall Risk Category  (Retired)  Low Low    (RETIRED) Patient Fall Risk Level High fall risk Low fall risk Low fall risk    Patient at Risk for Falls Due to  No Fall Risks History of fall(s) No Fall Risks History of fall(s)  Fall risk Follow up  Falls evaluation completed Falls evaluation completed Falls evaluation completed Falls evaluation completed;Falls prevention discussed      Review of Systems  Constitutional:  Positive for fatigue. Negative for chills and fever.  HENT:  Positive for congestion and rhinorrhea. Negative for sore throat.   Respiratory:  Negative for cough and shortness of breath.   Cardiovascular:  Positive for leg swelling. Negative for chest pain.  Gastrointestinal:  Negative for abdominal pain, constipation, diarrhea, nausea and vomiting.  Genitourinary:  Positive for dysuria and urgency.       Vaginal itching.  Bladder leakage.   Musculoskeletal:  Positive for arthralgias. Negative for back pain and myalgias.  Neurological:  Positive for dizziness and headaches. Negative for weakness and light-headedness.  Psychiatric/Behavioral:  Positive for dysphoric mood. The patient is not  nervous/anxious.     Current Outpatient Medications on File Prior to Visit  Medication Sig Dispense Refill   albuterol (PROVENTIL) (2.5 MG/3ML) 0.083% nebulizer solution Take 3 mLs (2.5 mg total) by nebulization every 6 (six) hours as needed for wheezing or shortness of breath. 75 mL 2   albuterol (VENTOLIN HFA) 108 (90 Base) MCG/ACT inhaler Inhale 2 puffs into the lungs every 4 (four) hours as needed for wheezing or shortness of breath.     ALPRAZolam (XANAX) 0.5 MG tablet Take 1 tablet (0.5 mg total) by mouth 3 (three) times daily as needed for anxiety. 90 tablet 1   Aspirin-Acetaminophen (GOODYS BODY PAIN PO) Take 1 packet by mouth daily.     blood glucose meter kit and supplies KIT Dispense based on patient and insurance preference. Use up to four times daily as directed. 1 each 0   clopidogrel (PLAVIX) 75  MG tablet TAKE ONE TABLET BY MOUTH EVERY MORNING 90 tablet 0   ezetimibe (ZETIA) 10 MG tablet TAKE ONE TABLET BY MOUTH ONCE DAILY 90 tablet 0   fluconazole (DIFLUCAN) 150 MG tablet Take 150 mg by mouth once.     docusate sodium (COLACE) 100 MG capsule Take 100 mg by mouth 2 (two) times daily as needed for mild constipation. (Patient not taking: Reported on 01/22/2023)     fenofibrate 160 MG tablet TAKE ONE TABLET BY MOUTH EVERY MORNING 90 tablet 0   fluticasone (FLONASE) 50 MCG/ACT nasal spray INSTILL 1 SPRAY IN EACH NOSTRIL ONCE DAILY (Patient taking differently: Place 1 spray into both nostrils daily as needed for rhinitis.) 16 g 1   gabapentin (NEURONTIN) 300 MG capsule Take 1 capsule (300 mg total) by mouth 2 (two) times daily. TAKE ONE CAPSULE BY MOUTH EVERYDAY AT BEDTIME 180 capsule 1   glucose blood (ONETOUCH ULTRA) test strip DISPENSE BASED ON PATIENT AND INSURANCE PREFERENCE. USE UP TO FOUR TIMES DAILY AS DIRECTED. 400 strip 3   lamoTRIgine (LAMICTAL) 25 MG tablet Take 2 tablets (50 mg total) by mouth at bedtime. 180 tablet 1   lansoprazole (PREVACID) 30 MG capsule TAKE ONE CAPSULE BY MOUTH BEFORE BREAKFAST 90 capsule 3   Levomilnacipran HCl ER (FETZIMA) 40 MG CP24 Take 40 mg by mouth daily. 90 capsule 0   montelukast (SINGULAIR) 10 MG tablet Take 10 mg by mouth daily.     naloxone (NARCAN) nasal spray 4 mg/0.1 mL SMARTSIG:1 Both Nares Daily     nitroGLYCERIN (NITROSTAT) 0.4 MG SL tablet Place 1 tablet (0.4 mg total) under the tongue every 5 (five) minutes as needed. 25 tablet 1   oxyCODONE-acetaminophen (PERCOCET) 10-325 MG tablet Take 1 tablet by mouth every 6 (six) hours as needed for pain.     rosuvastatin (CRESTOR) 20 MG tablet TAKE ONE TABLET BY MOUTH EVERYDAY AT BEDTIME 90 tablet 0   TRELEGY ELLIPTA 200-62.5-25 MCG/ACT AEPB Inhale 1 puff into the lungs daily.     TRUEplus Lancets 30G MISC 1 each by Does not apply route 2 (two) times daily. E11.69 400 each 3   valACYclovir (VALTREX)  1000 MG tablet TAKE TWO TABLETS BY MOUTH twice A DAY as needed 20 tablet 2   VASCEPA 1 g capsule TAKE TWO CAPSULES BY MOUTH TWICE DAILY 360 capsule 1   Current Facility-Administered Medications on File Prior to Visit  Medication Dose Route Frequency Provider Last Rate Last Admin   sodium chloride flush (NS) 0.9 % injection 3 mL  3 mL Intravenous Q12H [name], [name], MD  Past Medical History:  Diagnosis Date   Anxiety    CAD (coronary artery disease)    a. NSTEMI 11/2008 s/p DES to LCx (3.0x12 Xience); b. NSTEMI 01/2010 secondary to thrombotic RCA lesion (non-obstructive)-->med rx (integrilin x 24 hrs + plavix); c. 09/2012 negative Myoview.   Chronic diastolic CHF (congestive heart failure) (HCC)    a. 06/2014 Echo: EF 55-60%, no rwma, Gr1 DD, mild AI.   Depression    Dizziness    Drug induced constipation    Ganglion cyst of left foot 01/17/2021   Generalized hyperhidrosis    GERD (gastroesophageal reflux disease)    Headache    Hyperlipidemia    Hypertensive heart disease    Lumbar disc disease    Metabolic encephalopathy    Mixed hyperlipidemia    Myocardial infarction (HCC)    Obstructive sleep apnea    OP (osteoporosis)    Osteoarthritis    Osteoporosis    Other malaise    Overweight(278.02)    PAD (peripheral artery disease) (HCC)    a. Emboli to R foot 2010 from partially occlusive lesion in R EIA, s/p stenting. - followed by Dr. Arbie Cookey;  b. 10/2015 ABIs: R 1.03, L 0.97.   Restless leg    Sleep apnea    Stroke [facility])    TIA (transient ischemic attack)    Tobacco abuse    Urge incontinence    Past Surgical History:  Procedure Laterality Date   CYST EXCISION Left 01/2021   left foot   EYE SURGERY     at age 74   hysterectomy -age 55     ILIAC ARTERY STENT     RIGHT ILIAC STENT   KNEE ARTHROSCOPY     LEFT HEART CATH AND CORONARY ANGIOGRAPHY N/A 03/05/2022   Procedure: LEFT HEART CATH AND CORONARY ANGIOGRAPHY;  Surgeon: Corky Crafts, MD;  Location: [facility]  INVASIVE CV LAB;  Service: Cardiovascular;  Laterality: N/A;   LITHOTRIPSY Left 12/2020   LUMBAR LAMINECTOMY     TUBAL LIGATION      Family History  Problem Relation Age of Onset   Alzheimer's disease Mother    Hodgkin's lymphoma Brother    Lung cancer Brother    Hepatitis C Brother    Hypothyroidism Brother    Hypertension Brother    Depression Brother    Heart disease Other        Grandfather   Social History   Socioeconomic History   Marital status: Divorced    Spouse name: Not on file   Number of children: 3   Years of education: 10 th   Highest education level: Not on file  Occupational History   Occupation: DISABLED    Employer: UNEMPLOYED  Tobacco Use   Smoking status: Every Day    Packs/day: 2.00    Years: 54.00    Additional pack years: 0.00    Total pack years: 108.00    Types: Cigarettes    Last attempt to quit: 11/20/2012    Years since quitting: 10.1   Smokeless tobacco: Never   Tobacco comments:    2 ppd for many years.     04/13/2022 Patient smokes 1/2 pack daily or more if she is nervous  Substance and Sexual Activity   Alcohol use: No    Alcohol/week: 0.0 standard drinks of alcohol   Drug use: No   Sexual activity: Never  Other Topics Concern   Not on file  Social History Narrative   Patient is single with  3 children, 1 deceased.   Patient is right handed.   Patient has 10 th grade education.   Patient drinks 5 or more cups daily.   Social Determinants of Health   Financial Resource Strain: Low Risk  (09/14/2022)   Overall Financial Resource Strain (CARDIA)    Difficulty of Paying Living Expenses: Not hard at all  Food Insecurity: No Food Insecurity (09/14/2022)   Hunger Vital Sign    Worried About Running Out of Food in the Last Year: Never true    Ran Out of Food in the Last Year: Never true  Transportation Needs: No Transportation Needs (09/14/2022)   PRAPARE - Administrator, Civil Service (Medical): No    Lack of  Transportation (Non-Medical): No  Physical Activity: Inactive (09/14/2022)   Exercise Vital Sign    Days of Exercise per Week: 0 days    Minutes of Exercise per Session: 0 min  Stress: Stress Concern Present (09/14/2022)   [employer] of Occupational Health - Occupational Stress Questionnaire    Feeling of Stress : Rather much  Social Connections: Moderately Isolated (09/14/2022)   Social Connection and Isolation Panel [NHANES]    Frequency of Communication with Friends and Family: More than three times a week    Frequency of Social Gatherings with Friends and Family: Never    Attends Religious Services: More than 4 times per year    Active Member of [employer] or Organizations: No    Attends Engineer, structural: Never    Marital Status: Divorced    Objective:  BP 136/64   Pulse 100   Temp (!) 97.1 F (36.2 C)   Resp 16   Ht 5\' 5"  (1.651 m)   Wt 160 lb 12.8 oz (72.9 kg)   BMI 26.76 kg/m      01/22/2023    8:37 AM 01/11/2023    2:14 PM 01/02/2023    2:48 PM  BP/Weight  Systolic BP 136 107 112  Diastolic BP 64 67 62  Wt. (Lbs) 160.8 158 155  BMI 26.76 kg/m2 26.29 kg/m2 25.79 kg/m2    Physical Exam Vitals reviewed.  Constitutional:      Appearance: Normal appearance. She is normal weight.  Neck:     Vascular: No carotid bruit.  Cardiovascular:     Rate and Rhythm: Normal rate and regular rhythm.     Heart sounds: Normal heart sounds.  Pulmonary:     Effort: Pulmonary effort is normal. No respiratory distress.     Breath sounds: Normal breath sounds.  Abdominal:     General: Abdomen is flat. Bowel sounds are normal.     Palpations: Abdomen is soft.     Tenderness: There is no abdominal tenderness.  Musculoskeletal:     Right lower leg: Edema present.     Left lower leg: Edema present.  Neurological:     Mental Status: She is alert and oriented to person, place, and time.  Psychiatric:        Mood and Affect: Mood normal.        Behavior: Behavior  normal.     Diabetic Foot Exam - Simple   No data filed      Lab Results  Component Value Date   WBC 8.6 01/23/2023   HGB 12.6 01/23/2023   HCT 40.2 01/23/2023   PLT 281 01/23/2023   GLUCOSE 107 (H) 01/23/2023   CHOL 99 (L) 01/23/2023   TRIG 94 01/23/2023   HDL 42 01/23/2023  LDLCALC 39 01/23/2023   ALT 18 01/23/2023   AST 29 01/23/2023   NA 146 (H) 01/23/2023   K 4.7 01/23/2023   CL 109 (H) 01/23/2023   CREATININE 0.70 01/23/2023   BUN 17 01/23/2023   CO2 22 01/23/2023   TSH 0.630 01/23/2023   INR 1.0 01/31/2019   HGBA1C 7.7 (H) 01/23/2023   MICROALBUR neg 02/27/2021      Assessment & Plan:    Dysuria Assessment & Plan: Checked UA.  Orders: -     POCT URINALYSIS DIP (CLINITEK)  Type 2 diabetes mellitus with diabetic neuropathy, without long-term current use of insulin (HCC) Assessment & Plan: Stop farxiga (sugar pill) Discontinue trulicity.  Order tresiba 20 U before bed.  Continue to check sugars. Call in 1 week for sugars.     Orders: -     Hemoglobin A1c -     Cardiovascular Risk Assessment -     Evaristo Bury FlexTouch; Inject 20 Units into the skin at bedtime.  Dispense: 3 mL; Refill: 2  Hypertensive heart disease with chronic combined systolic and diastolic congestive heart failure (HCC) Assessment & Plan: Valsartan 40 mg once daily for bp.  Increase torsemide to 20 mg daily for swelling.    Orders: -     Valsartan; Take 1 tablet (40 mg total) by mouth daily.  Dispense: 90 tablet; Refill: 0 -     CBC with Differential/Platelet -     Comprehensive metabolic panel  Mixed hyperlipidemia Assessment & Plan: Well controlled.  No changes to medicines. Continue fenofibrate 160 mg daily, rosuvastatin 20 mg daily. Vascepa 1 gm 2 capsules twice daily, and zetia 10 mg daily.   Continue to work on eating a healthy diet and exercise.     Orders: -     Lipid panel -     T4, free -     TSH  Mixed stress and urge urinary incontinence Assessment &  Plan: Increase myrbetriq to 50 mg daily for bladder incontinence    Orders: -     Mirabegron ER; Take 1 tablet (50 mg total) by mouth daily.  Dispense: 30 tablet; Refill: 2  Candidal vaginitis Assessment & Plan: Presumptive diagnosis.  Stop farxiga Fluconazole 150 mg once daily x [facility]    Orders: -     Fluconazole; Take 1 tablet (150 mg total) by mouth once for 1 dose.  Dispense: 1 tablet; Refill: 0  Coronary artery disease involving native coronary artery of native heart without angina pectoris Assessment & Plan: Management per specialist.  Continue plavix, crestor, fenofibrate, zetia, vascepa, ntg.    Pedal edema Assessment & Plan: Increased torsemide 20 mg daily.   Orders: -     Torsemide; Take 1 tablet (20 mg total) by mouth daily.  Dispense: 30 tablet; Refill: 2     Meds ordered this encounter  Medications   valsartan (DIOVAN) 40 MG tablet    Sig: Take 1 tablet (40 mg total) by mouth daily.    Dispense:  90 tablet    Refill:  0   torsemide (DEMADEX) 20 MG tablet    Sig: Take 1 tablet (20 mg total) by mouth daily.    Dispense:  30 tablet    Refill:  2   mirabegron ER (MYRBETRIQ) 50 MG TB24 tablet    Sig: Take 1 tablet (50 mg total) by mouth daily.    Dispense:  30 tablet    Refill:  2   DISCONTD: TRULICITY 4.5  MG/0.5ML SOPN    Sig: Inject 4.5 mg into the skin once a week.    Dispense:  2 mL    Refill:  2   fluconazole (DIFLUCAN) 150 MG tablet    Sig: Take 1 tablet (150 mg total) by mouth once for 1 dose.    Dispense:  1 tablet    Refill:  0   insulin degludec (TRESIBA FLEXTOUCH) 200 UNIT/ML FlexTouch Pen    Sig: Inject 20 Units into the skin at bedtime.    Dispense:  3 mL    Refill:  2    Orders Placed This Encounter  Procedures   CBC with Differential/Platelet   Comprehensive metabolic panel   Hemoglobin A1c   Lipid panel   T4, free   TSH   Cardiovascular Risk Assessment   POCT URINALYSIS DIP (CLINITEK)    Follow-up:  No follow-ups on file.  I,Marla I Leal-Borjas,acting as a scribe for Blane Ohara, MD.,have documented all relevant documentation on the behalf of Blane Ohara, MD,as directed by  Blane Ohara, MD while in the presence of Blane Ohara, MD.    An After Visit Summary was printed and given to the patient.  I attest that I have reviewed this visit and agree with the plan scribed by my staff.   Blane Ohara, MD [facility] [phone]

## 2023-01-22 NOTE — Patient Instructions (Addendum)
Valsartan 40 mg once daily for bp.  Increase torsemide to 20 mg daily for swelling.  Increase myrbetriq to 50 mg daily for bladder incontinence Fluconazole 150 mg once daily x [facility]  Stop [facility]xiga (sugar pill) [facility] Unable to get. Change to tresiba 20 U before bed.

## 2023-01-23 ENCOUNTER — Telehealth: Payer: Self-pay

## 2023-01-23 ENCOUNTER — Other Ambulatory Visit (INDEPENDENT_AMBULATORY_CARE_PROVIDER_SITE_OTHER): Payer: Medicare Other

## 2023-01-23 DIAGNOSIS — M810 Age-related osteoporosis without current pathological fracture: Secondary | ICD-10-CM

## 2023-01-23 MED ORDER — ROMOSOZUMAB-AQQG 105 MG/1.17ML ~~LOC~~ SOSY
210.0000 mg | PREFILLED_SYRINGE | Freq: Once | SUBCUTANEOUS | Status: AC
Start: 2023-01-23 — End: 2023-01-23
  Administered 2023-01-23: 210 mg via SUBCUTANEOUS

## 2023-01-23 NOTE — Telephone Encounter (Signed)
The patient called this afternoon stating that the TRULICITY is on backorder at [facility] and [facility]. The patient is due for her Trulicity today. Dr. Sedalia Muta please advise.

## 2023-01-24 LAB — COMPREHENSIVE METABOLIC PANEL
ALT: 18 IU/L (ref 0–32)
AST: 29 IU/L (ref 0–40)
Albumin/Globulin Ratio: 1.5 (ref 1.2–2.2)
Albumin: 3.8 g/dL (ref 3.8–4.8)
Alkaline Phosphatase: 55 IU/L (ref 44–121)
BUN/Creatinine Ratio: 24 (ref 12–28)
BUN: 17 mg/dL (ref 8–27)
Bilirubin Total: 0.2 mg/dL (ref 0.0–1.2)
CO2: 22 mmol/L (ref 20–29)
Calcium: 9.1 mg/dL (ref 8.7–10.3)
Chloride: 109 mmol/L — ABNORMAL HIGH (ref 96–106)
Creatinine, Ser: 0.7 mg/dL (ref 0.57–1.00)
Globulin, Total: 2.5 g/dL (ref 1.5–4.5)
Glucose: 107 mg/dL — ABNORMAL HIGH (ref 70–99)
Potassium: 4.7 mmol/L (ref 3.5–5.2)
Sodium: 146 mmol/L — ABNORMAL HIGH (ref 134–144)
Total Protein: 6.3 g/dL (ref 6.0–8.5)
eGFR: 91 mL/min/{1.73_m2} (ref >59)

## 2023-01-24 LAB — CBC WITH DIFFERENTIAL/PLATELET
Basophils Absolute: 0 10*3/uL (ref 0.0–0.2)
Basos: 1 %
EOS (ABSOLUTE): 0.1 10*3/uL (ref 0.0–0.4)
Eos: 1 %
Hematocrit: 40.2 % (ref 34.0–46.6)
Hemoglobin: 12.6 g/dL (ref 11.1–15.9)
Immature Grans (Abs): 0.1 10*3/uL (ref 0.0–0.1)
Immature Granulocytes: 1 %
Lymphocytes Absolute: 2.5 10*3/uL (ref 0.7–3.1)
Lymphs: 30 %
MCH: 28 pg (ref 26.6–33.0)
MCHC: 31.3 g/dL — ABNORMAL LOW (ref 31.5–35.7)
MCV: 89 fL (ref 79–97)
Monocytes Absolute: 0.9 10*3/uL (ref 0.1–0.9)
Monocytes: 10 %
Neutrophils Absolute: 5 10*3/uL (ref 1.4–7.0)
Neutrophils: 57 %
Platelets: 281 10*3/uL (ref 150–450)
RBC: 4.5 x10E6/uL (ref 3.77–5.28)
RDW: 13.2 % (ref 11.7–15.4)
WBC: 8.6 10*3/uL (ref 3.4–10.8)

## 2023-01-24 LAB — LIPID PANEL
Chol/HDL Ratio: 2.4 ratio (ref 0.0–4.4)
Cholesterol, Total: 99 mg/dL — ABNORMAL LOW (ref 100–199)
HDL: 42 mg/dL (ref >39)
LDL Chol Calc (NIH): 39 mg/dL (ref 0–99)
Triglycerides: 94 mg/dL (ref 0–149)
VLDL Cholesterol Cal: 18 mg/dL (ref 5–40)

## 2023-01-24 LAB — HEMOGLOBIN A1C
Est. average glucose Bld gHb Est-mCnc: 174 mg/dL
Hgb A1c MFr Bld: 7.7 % — ABNORMAL HIGH (ref 4.8–5.6)

## 2023-01-24 LAB — TSH: TSH: 0.63 u[IU]/mL (ref 0.450–4.500)

## 2023-01-24 LAB — T4, FREE: Free T4: 1.3 ng/dL (ref 0.82–1.77)

## 2023-01-24 LAB — CARDIOVASCULAR RISK ASSESSMENT

## 2023-01-24 NOTE — Progress Notes (Signed)
Patient tolerated well.

## 2023-01-25 ENCOUNTER — Telehealth: Payer: Self-pay

## 2023-01-25 NOTE — Progress Notes (Signed)
Care Management & Coordination Services Pharmacy Team  Reason for Encounter: Appointment Reminder  Contacted patient to confirm telephone appointment with [name], PharmD on 01/29/23 at 9:00 am.  Spoke with patient on 01/25/2023   Do you have any problems getting your medications? Yes If yes what types of problems are you experiencing? Access barriers, pt is unable to find Trulicity. She has called around to check and everyone is on back order. She took her last shot this past Wednesday. Pt does not want to get on another shot.   What is your top health concern you would like to discuss at your upcoming visit? Pt is having a lot of swelling and stiffness her legs. Dr. Sedalia Muta did increase her Torsemide to 20mg . [facility] does have a new script for the dose change. Pt was advised to take the old Torsemide 10mg  out of her packs and [facility] is sending the new dose in a bottle. Pt is aware. Pt also stated she is getting a shot through Dr. Sedalia Muta in her right and left arm monthly. She could not tell me what kind of shot these are.   Have you seen any other providers since your last visit with PCP? No   Chart review:  Recent office visits:  01/23/23 Blane Ohara MD (PCP). Orders only. I believe patient is unable to find trulicity.  Recommend start on tresiba 20 U daily. Recommend in 1 week call with sugars and we can adjust insulin.  01/22/23 [name] MD (PCP). Seen for Edema and Urinary Frequency.  Started on Fluconazole 150mg  once and Valsartan 40mg  daily.  Increased Mirabegron from 25mg  to 50mg  daily Increased Torsemide from 10mg  to 20mg  daily D/C Farxiga 10mg  due to side effects   Recent consult visits:  01/11/23 [name], [name] MD (Cardiology) Seen for Coronary artery disease involving native coronary artery of native heart without angina pectoris, Chronic diastolic heart failure (HCC), Essential hypertension, Tobacco abuse, OSA (obstructive sleep apnea). No med changes.   Hospital visits:   None  Star Rating Drugs:  Medication:  Last Fill: Day Supply Rosuvastatin   01/16/23-12/17/22 30ds Trulicity   LF 12/17/22 28ds  (Pharmacies are on back order. [facility])  Valsartan  01/16/23 90ds  Care Gaps: Annual wellness visit in last year? Yes Colonoscopy: Never done Mammogram: Never done  If Diabetic: Last eye exam / retinopathy screening:09/04/22 Last diabetic foot exam:12/06/22   [name], [facility] Clinical Pharmacist Assistant  [phone] / /

## 2023-01-25 NOTE — Telephone Encounter (Signed)
See labs. Dr. Sedalia Muta

## 2023-01-26 DIAGNOSIS — B3731 Acute candidiasis of vulva and vagina: Secondary | ICD-10-CM | POA: Insufficient documentation

## 2023-01-26 DIAGNOSIS — R300 Dysuria: Secondary | ICD-10-CM | POA: Insufficient documentation

## 2023-01-26 NOTE — Assessment & Plan Note (Signed)
Valsartan 40 mg once daily for bp.  Increase torsemide to 20 mg daily for swelling.

## 2023-01-26 NOTE — Assessment & Plan Note (Signed)
Well controlled.  No changes to medicines. Continue fenofibrate 160 mg daily, rosuvastatin 20 mg daily. Vascepa 1 gm 2 capsules twice daily, and zetia 10 mg daily.   Continue to work on eating a healthy diet and exercise.    

## 2023-01-26 NOTE — Assessment & Plan Note (Signed)
Stop farxiga (sugar pill) Get trulicity at [facility].

## 2023-01-26 NOTE — Assessment & Plan Note (Signed)
Presumptive diagnosis.  Stop farxiga Fluconazole 150 mg once daily x [facility]

## 2023-01-26 NOTE — Assessment & Plan Note (Signed)
Checked UA. 

## 2023-01-26 NOTE — Assessment & Plan Note (Signed)
Increase myrbetriq to 50 mg daily for bladder incontinence

## 2023-01-27 ENCOUNTER — Encounter: Payer: Self-pay | Admitting: Family Medicine

## 2023-01-27 DIAGNOSIS — R600 Localized edema: Secondary | ICD-10-CM | POA: Insufficient documentation

## 2023-01-27 MED ORDER — TRESIBA FLEXTOUCH 200 UNIT/ML ~~LOC~~ SOPN
20.0000 [IU] | PEN_INJECTOR | Freq: Every evening | SUBCUTANEOUS | 2 refills | Status: DC
Start: 2023-01-27 — End: 2023-01-29

## 2023-01-27 NOTE — Assessment & Plan Note (Signed)
Increased torsemide 20 mg daily.

## 2023-01-27 NOTE — Assessment & Plan Note (Signed)
Management per specialist.  Continue plavix, crestor, fenofibrate, zetia, vascepa, ntg.

## 2023-01-28 NOTE — Progress Notes (Signed)
Evenity given by Reginal Lutes, CMA. One in each arm.  Patient tolerated well.

## 2023-01-29 ENCOUNTER — Other Ambulatory Visit: Payer: Self-pay | Admitting: Family Medicine

## 2023-01-29 ENCOUNTER — Ambulatory Visit: Payer: Medicare Other

## 2023-01-29 MED ORDER — VALSARTAN 320 MG PO TABS
320.0000 mg | ORAL_TABLET | Freq: Every day | ORAL | 3 refills | Status: DC
Start: 2023-01-29 — End: 2023-02-04

## 2023-01-29 MED ORDER — OZEMPIC (0.25 OR 0.5 MG/DOSE) 2 MG/3ML ~~LOC~~ SOPN
0.5000 mg | PEN_INJECTOR | SUBCUTANEOUS | 0 refills | Status: DC
Start: 2023-01-29 — End: 2023-02-19

## 2023-01-29 NOTE — Addendum Note (Signed)
Addended by: Zettie Pho on: 01/29/2023 03:31 PM   Modules accepted: Orders

## 2023-01-29 NOTE — Patient Outreach (Signed)
Care Management & Coordination Services Pharmacy Note  01/29/2023 Name:  Sonya Small MRN:  [id number] DOB:  Aug 12, 1951  Summary:  Very very very pleasant 72 year old female presents for f/u CCM visit Brother passed May 2023 of lung cancer. She misses him greatly and it has inspired her to try to quit smoking Her grandson was visiting her from [place] in Sept 2023 for 2 weeks. She was making him some chicken. She was very excited to be able to spend time with him  Recommendations/Changes made from today's visit: -Sent direct msg to PCP to which she responded. Check note for more details    Subjective: Small Sonya is an 72 y.o. year old female who is a primary patient of [name], [name], MD.  The care coordination team was consulted for assistance with disease management and care coordination needs.    Engaged with patient by telephone for follow up visit.  Recent office visits:  01/23/23 Blane Ohara MD (PCP). Orders only. I believe patient is unable to find trulicity.  Recommend start on tresiba 20 U daily. Recommend in 1 week call with sugars and we can adjust insulin.   01/22/23 [name] MD (PCP). Seen for Edema and Urinary Frequency.  Started on Fluconazole 150mg  once and Valsartan 40mg  daily.  Increased Mirabegron from 25mg  to 50mg  daily Increased Torsemide from 10mg  to 20mg  daily D/C Farxiga 10mg  due to side effects    Recent consult visits:  01/11/23 [name], [name] MD (Cardiology) Seen for Coronary artery disease involving native coronary artery of native heart without angina pectoris, Chronic diastolic heart failure (HCC), Essential hypertension, Tobacco abuse, OSA (obstructive sleep apnea). No med changes.    Hospital visits:  None   Objective:  Lab Results  Component Value Date   CREATININE 0.70 01/23/2023   BUN 17 01/23/2023   EGFR 91 01/23/2023   GFRNONAA >60 10/19/2020   GFRAA 97 10/03/2020   NA 146 (H) 01/23/2023   K 4.7 01/23/2023   CALCIUM 9.1 01/23/2023    CO2 22 01/23/2023   GLUCOSE 107 (H) 01/23/2023    Lab Results  Component Value Date/Time   HGBA1C 7.7 (H) 01/23/2023 10:06 AM   HGBA1C 7.9 (H) 09/12/2022 09:17 AM   MICROALBUR neg 02/27/2021 10:10 AM   MICROALBUR 80 11/25/2020 08:27 AM    Last diabetic Eye exam:  Lab Results  Component Value Date/Time   HMDIABEYEEXA No Retinopathy 09/04/2022 12:00 AM    Last diabetic Foot exam: No results found for: "HMDIABFOOTEX"   Lab Results  Component Value Date   CHOL 99 (L) 01/23/2023   HDL 42 01/23/2023   LDLCALC 39 01/23/2023   TRIG 94 01/23/2023   CHOLHDL 2.4 01/23/2023       Latest Ref Rng & Units 01/23/2023   10:06 AM 01/02/2023    4:22 PM 10/26/2022   11:54 AM  Hepatic Function  Total Protein 6.0 - 8.5 g/dL 6.3  6.0  6.8   Albumin 3.8 - 4.8 g/dL 3.8  4.0  4.4   AST 0 - 40 IU/L 29  27  18    ALT 0 - 32 IU/L 18  24  18    Alk Phosphatase 44 - 121 IU/L 55  55  88   Total Bilirubin 0.0 - 1.2 mg/dL 0.2  <4.0  0.2     Lab Results  Component Value Date/Time   TSH 0.630 01/23/2023 10:06 AM   TSH 0.605 09/12/2022 09:17 AM   FREET4 1.30 01/23/2023 10:06 AM  FREET4 1.21 06/11/2022 10:51 AM       Latest Ref Rng & Units 01/23/2023   10:06 AM 01/02/2023    4:22 PM 12/28/2022    3:49 PM  CBC  WBC 3.4 - 10.8 x10E3/uL 8.6  10.6  13.2   Hemoglobin 11.1 - 15.9 g/dL 16.1  09.6  04.5   Hematocrit 34.0 - 46.6 % 40.2  41.4  43.1   Platelets 150 - 450 x10E3/uL 281  327  295     Lab Results  Component Value Date/Time   VITAMINB12 408 10/26/2022 11:54 AM   VITAMINB12 417 10/03/2020 03:18 PM    Clinical ASCVD: Yes  The ASCVD Risk score (Arnett DK, et al., 2019) failed to calculate for the following reasons:   The patient has a prior MI or stroke diagnosis    Other: (CHADS2VASc if Afib, MMRC or CAT for COPD, ACT, DEXA)     01/22/2023    8:47 AM 09/14/2022    9:02 AM 08/28/2022    1:56 PM  Depression screen PHQ 2/9  Decreased Interest 0 3 3  Down, Depressed, Hopeless 3 3 3   PHQ - 2  Score 3 6 6   Altered sleeping 3 0 3  Tired, decreased energy 3 3 3   Change in appetite 3 0 1  Feeling bad or failure about yourself  1 0 0  Trouble concentrating 3 1 0  Moving slowly or fidgety/restless 3 0 3  Suicidal thoughts 0 0 0  PHQ-9 Score 19 10 16   Difficult doing work/chores Very difficult Somewhat difficult Very difficult     Social History   Tobacco Use  Smoking Status Every Day   Packs/day: 2.00   Years: 54.00   Additional pack years: 0.00   Total pack years: 108.00   Types: Cigarettes   Last attempt to quit: 11/20/2012   Years since quitting: 10.1  Smokeless Tobacco Never  Tobacco Comments   2 ppd for many years.    04/13/2022 Patient smokes 1/2 pack daily or more if she is nervous   BP Readings from Last 3 Encounters:  01/22/23 136/64  01/11/23 107/67  01/02/23 112/62   Pulse Readings from Last 3 Encounters:  01/22/23 100  01/11/23 82  01/02/23 77   Wt Readings from Last 3 Encounters:  01/22/23 160 lb 12.8 oz (72.9 kg)  01/11/23 158 lb (71.7 kg)  01/02/23 155 lb (70.3 kg)   BMI Readings from Last 3 Encounters:  01/22/23 26.76 kg/m  01/11/23 26.29 kg/m  01/02/23 25.79 kg/m    Allergies  Allergen Reactions   Abilify [Aripiprazole]     confusion   Latex Rash    Medications Reviewed Today     Reviewed by Blane Ohara, MD (Physician) on 01/27/23 at 1620  Med List Status: <None>   Medication Order Taking? Sig Documenting Provider Last Dose Status Informant  albuterol (PROVENTIL) (2.5 MG/3ML) 0.083% nebulizer solution [id number] Yes Take 3 mLs (2.5 mg total) by nebulization every 6 (six) hours as needed for wheezing or shortness of breath. [name], [name], MD Taking Active   albuterol (VENTOLIN HFA) 108 [phone] Base) MCG/ACT inhaler [id number] Yes Inhale 2 puffs into the lungs every 4 (four) hours as needed for wheezing or shortness of breath. [provider] Taking Active Self  ALPRAZolam Prudy Feeler) 0.5 MG tablet [id number] Yes Take 1 tablet (0.5  mg total) by mouth 3 (three) times daily as needed for anxiety. Blane Ohara, MD Taking Active   Aspirin-Acetaminophen (GOODYS BODY PAIN PO) [id number] Yes  Take 1 packet by mouth daily. [provider] Taking Active Self  blood glucose meter kit and supplies KIT [id number] Yes Dispense based on patient and insurance preference. Use up to four times daily as directed. [name], [name], MD Taking Active   clopidogrel (PLAVIX) 75 MG tablet [id number] Yes TAKE ONE TABLET BY MOUTH EVERY MORNING [name], [name], MD Taking Active   docusate sodium (COLACE) 100 MG capsule [id number] No Take 100 mg by mouth 2 (two) times daily as needed for mild constipation.  Patient not taking: Reported on 01/22/2023   [provider] Not Taking Active   ezetimibe (ZETIA) 10 MG tablet [id number] Yes TAKE ONE TABLET BY MOUTH ONCE DAILY [name], [name], MD Taking Active   fenofibrate 160 MG tablet [id number]  TAKE ONE TABLET BY MOUTH EVERY MORNING [name], [name], MD  Active   fluconazole (DIFLUCAN) 150 MG tablet [id number] Yes Take 150 mg by mouth once. [provider]  Active   fluticasone (FLONASE) 50 MCG/ACT nasal spray [id number]  INSTILL 1 SPRAY IN EACH NOSTRIL ONCE DAILY  Patient taking differently: Place 1 spray into both nostrils daily as needed for rhinitis.   [name], [name], MD  Active Self  gabapentin (NEURONTIN) 300 MG capsule [id number]  Take 1 capsule (300 mg total) by mouth 2 (two) times daily. TAKE ONE CAPSULE BY MOUTH EVERYDAY AT BEDTIME [name], [name], MD  Active   glucose blood (ONETOUCH ULTRA) test strip [id number]  DISPENSE BASED ON PATIENT AND INSURANCE PREFERENCE. USE UP TO FOUR TIMES DAILY AS DIRECTED. [name], [name], MD  Active   insulin degludec (TRESIBA FLEXTOUCH) 200 UNIT/ML FlexTouch Pen [id number] Yes Inject 20 Units into the skin at bedtime. [name], [name], MD  Active   lamoTRIgine (LAMICTAL) 25 MG tablet [id number]  Take 2 tablets (50 mg total) by mouth at bedtime. Blane Ohara, MD  Active    lansoprazole (PREVACID) 30 MG capsule [id number]  TAKE ONE CAPSULE BY MOUTH BEFORE BREAKFAST Marianne Sofia, PA-C  Active   Levomilnacipran HCl ER (FETZIMA) 40 MG CP24 [id number]  Take 40 mg by mouth daily. [name], [name], MD  Active   mirabegron ER [facility]) 50 MG TB24 tablet [id number] Yes Take 1 tablet (50 mg total) by mouth daily. [name], [name], MD  Active   montelukast (SINGULAIR) 10 MG tablet [id number]  Take 10 mg by mouth daily. [provider]  Active Self  naloxone [facility]) nasal spray 4 mg/0.1 mL [id number]  SMARTSIG:1 Both Nares Daily [provider]  Active   nitroGLYCERIN (NITROSTAT) 0.4 MG SL tablet [id number]  Place 1 tablet (0.4 mg total) under the tongue every 5 (five) minutes as needed. [name], [name], MD  Active   oxyCODONE-acetaminophen (PERCOCET) 10-325 MG tablet [id number]  Take 1 tablet by mouth every 6 (six) hours as needed for pain. [provider]  Active Self  rosuvastatin (CRESTOR) 20 MG tablet [id number]  TAKE ONE TABLET BY MOUTH EVERYDAY AT BEDTIME [name], [name], MD  Active   sodium chloride flush (NS) 0.9 % injection 3 mL [id number]   [name], [name], MD  Active   torsemide (DEMADEX) 20 MG tablet [id number] Yes Take 1 tablet (20 mg total) by mouth daily. Blane Ohara, MD  Active   Harrel Carina ELLIPTA 200-62.5-25 MCG/ACT AEPB [id number]  Inhale 1 puff into the lungs daily. [provider]  Active   TRUEplus Lancets 30G MISC [id number]  1 each by Does not apply route 2 (two) times daily. E11.69 CoxFritzi Mandes, MD  Active   valACYclovir (VALTREX) 1000 MG  tablet [id number]  TAKE TWO TABLETS BY MOUTH twice A DAY as needed [name], [name], MD  Active   valsartan (DIOVAN) 40 MG tablet [id number] Yes Take 1 tablet (40 mg total) by mouth daily. [name], Fritzi Mandes, MD  Active   VASCEPA 1 g capsule [id number]  TAKE TWO CAPSULES BY MOUTH TWICE DAILY [name], [name], MD  Active             SDOH:  (Social Determinants of Health) assessments and interventions performed:  Yes SDOH Interventions    Flowsheet Row Care Coordination from 01/29/2023 in [facility] Office Visit from 12/06/2022 in [name] Office Visit from 09/14/2022 in [name] Office Visit from 08/28/2022 in [name] Chronic Care Management from 06/07/2022 in [name] Office Visit from 01/24/2022 in [name]  SDOH Interventions        Food Insecurity Interventions -- -- Intervention Not Indicated -- -- --  Housing Interventions -- -- Intervention Not Indicated -- -- --  Transportation Interventions Intervention Not Indicated -- Intervention Not Indicated -- Intervention Not Indicated --  Utilities Interventions -- -- Intervention Not Indicated -- -- --  Alcohol Usage Interventions -- -- Intervention Not Indicated (Score <7) -- -- --  Depression Interventions/Treatment  -- Medication -- Medication -- Currently on Treatment  Financial Strain Interventions Intervention Not Indicated -- Intervention Not Indicated -- Other (Comment) --  Physical Activity Interventions -- -- Intervention Not Indicated -- -- --  Stress Interventions -- -- Intervention Not Indicated -- -- --  Social Connections Interventions -- -- Intervention Not Indicated -- -- --       Medication Assistance: None required.  Patient affirms current coverage meets needs.   Name and location of Current pharmacy:  [facility] - [place], [place] - [address] [place] [address] Phone: [phone] Fax: [phone]  [facility] [address], [place] - [address] [facility] [place] [address] Phone: [phone] Fax: [phone]  [facility] [address] [place] - [address] [place] [address] Phone: [phone] Fax: [phone]   Compliance/Adherence/Medication fill history: Star Rating  Drugs:  Medication:                Last Fill:         Day Supply Rosuvastatin               01/16/23-12/17/22 30ds Trulicity                        LF 12/17/22 28ds  (Pharmacies are on back order. [facility], [facility])  Valsartan                     01/16/23 90ds   Care Gaps: Annual wellness visit in last year? Yes Colonoscopy: Never done Mammogram: Never done   If Diabetic: Last eye exam / retinopathy screening:09/04/22 Last diabetic foot exam:12/06/22   Assessment/Plan  Hypertension (BP goal <130/80) -controlled -Current treatment: Valsartan 40mg  Appropriate, Query effective,  -Medications previously tried: amlodipine, Lisinopril/HCTZ (polyuria) -Current home readings:  May 2024: 01/29/23: 178/88   HR 72 170/84 155/85 147/90 -Current dietary habits: enjoying fruit. Cooks at home. Reports limited appetite since brother has been ill.  -Current exercise habits: minimal due to back pain and breathing  -Denies hypotensive/hypertensive symptoms -Educated on BP  goals and benefits of medications for prevention of heart attack, stroke and kidney damage; Daily salt intake goal < 2300 mg; Importance of home blood pressure monitoring; -Counseled to monitor BP at home daily, document, and provide log at future appointments -Counseled on diet and exercise extensively May 2024: Patient's BP elevated. Sent direct msg to PCP asking to double Valsartan -PCP responded and sent new dose of Valsartan. Coordinate with [facility] to get sent   Hyperlipidemia/CAD: (LDL goal < 55) The ASCVD Risk score (Arnett DK, et al., 2019) failed to calculate for the following reasons:   The patient has a prior MI or stroke diagnosis Lab Results  Component Value Date   CHOL 99 (L) 01/23/2023   CHOL 113 09/12/2022   CHOL 147 03/01/2022   Lab Results  Component Value Date   HDL 42 01/23/2023   HDL 40 09/12/2022   HDL 41 03/01/2022   Lab Results  Component Value Date   LDLCALC 39 01/23/2023    LDLCALC 52 09/12/2022   LDLCALC 82 03/01/2022   Lab Results  Component Value Date   TRIG 94 01/23/2023   TRIG 113 09/12/2022   TRIG 134 03/01/2022   Lab Results  Component Value Date   CHOLHDL 2.4 01/23/2023   CHOLHDL 2.8 09/12/2022   CHOLHDL 3.6 03/01/2022   No results found for: "LDLDIRECT" Last vitamin D No results found for: "25OHVITD2", "25OHVITD3", "VD25OH" Lab Results  Component Value Date   TSH 0.630 01/23/2023   -Patient had a NSTEMI in March 2010 and underwent DES to left circumflex.  She had another NSTEMI in May 2011 secondary to thrombotic RCA lesion, cardiac catheterization showed nonobstructive disease, medical therapy was recommended.  She was admitted for recurrent chest pain in January 2014, Myoview was negative.  She was admitted for acute CVA in October 2015 after presenting with slurred speech and facial drooping.  MRI showed acute right MCA infarct involving basal ganglia and periventricular white matter.  She was started on aspirin along with Plavix.  Myoview obtained in August 2017 showed no ischemia, normal EF.   ABI obtained in July 2018 was normal.    -She was seen by Azalee Course PA-C on 03/13/2018  with chest pain.  A Myoview study on 03/25/2018 which showed EF 52%.    -She was seen in the ED on 10/19/20 with acute mid thoracic pain. Started left scapula and moved to the right. She states Ntg did help. Troponin was normal x 2. Ecg showed mild ST depression in leads V5-6. CXR normal. Other labs normal. Pain lasted about 30 minutes  -When seen last year she noted that she has had pain across her shoulder blades and upper back for several months. It is worse with activity. She does get relief with pain medication and Ntg.   -When seen in June she ws having worsening chest pain. She has been under a lot of stress. She was caring for her brother and he died this past month with lung CA. She has complained of chest pain since then. Worse when stressed. Some relief with sl Ntg  but she doesn't like to take. She notes pain across her anterior chest and into her back. Doesn't feel well. Has trouble staying awake. Fingers are numb. Notes toes are blue. She underwent cardiac cath which showed no significant obstructive CAD. She also had follow up LE arterial vascular dopplers which were stable with good ABIs.  -uncontrolled -Current treatment: rosuvastatin 20 mg daily at bedtime Appropriate, Effective, Safe, Accessible  Fenofibrate 160 mg daily am Appropriate, Effective, Safe, Accessible Clopidogrel 75 mg daily Appropriate, Effective, Safe, Accessible Vascepa 1 gram 2 capsules bid Appropriate, Effective, Safe, Accessible -Medications previously tried: none reported -Current dietary patterns: cooks at home. Eats meatloaf, pinto beans, stewed potatoes, homemade biscuits -Current exercise habits: limited due to back pain and shortness of breath  -Educated on Cholesterol goals;  Benefits of statin for ASCVD risk reduction; Importance of limiting foods high in cholesterol; -Counseled on diet and exercise extensively Recommended to continue current medication.    Diabetes (A1c goal <7%) -controlled -Current medications: Trulicity 4.5 mg weekly Appropriate, Query effective,  -Medications previously tried: [place] (Vaginal itching) -Current home glucose readings fasting glucose:  Sept 2023: 05/26/22: 183 05/27/22: 167 05/30/22: 149 05/31/22: 170 06/01/22: 170 06/02/22: 161 06/03/22: 145 06/04/22: 123 06/05/22: 146 06/06/22: 126 06/07/22: 150 -Denies hypoglycemic/hyperglycemic symptoms -Current meal patterns:  breakfast: fruit  Lunch and supper:  variety of meat and vegtables drinks: nature's twist, water, diet drinks -Current exercise: limited due to back pain and breathing -Educated onA1c and blood sugar goals; Complications of diabetes including kidney damage, retinal damage, and cardiovascular disease; Benefits of routine self-monitoring of blood sugar; -Counseled to check  feet daily and get yearly eye exams -Counseled on diet and exercise extensively Sept 2023: PCP increased Trulicity but patient hasn't started yet. Will wait to see results of increased dose May 2024: Patient refuses insulin. Unable to get Trulicity, out of stock everywhere. Patient agrees to trial Ozempic for a month until Trulicity. PCP sent new script   Depression/Anxiety (Goal: manage symptoms) -Uncontrolled -Current treatment: Lamotrigine 100 mg daily Appropriate, Effective, Safe, Accessible Alprazolam 0.5 mg tid prn Appropriate, Effective, Safe, Accessible -Medications previously tried/failed: Abilify  -PHQ9:     01/22/2023    8:47 AM 09/14/2022    9:02 AM 08/28/2022    1:56 PM  Depression screen PHQ 2/9  Decreased Interest 0 3 3  Down, Depressed, Hopeless 3 3 3   PHQ - 2 Score 3 6 6   Altered sleeping 3 0 3  Tired, decreased energy 3 3 3   Change in appetite 3 0 1  Feeling bad or failure about yourself  1 0 0  Trouble concentrating 3 1 0  Moving slowly or fidgety/restless 3 0 3  Suicidal thoughts 0 0 0  PHQ-9 Score 19 10 16   Difficult doing work/chores Very difficult Somewhat difficult Very difficult  -GAD7:     05/08/2021   11:04 AM 08/10/2020    1:33 PM  GAD 7 : Generalized Anxiety Score  Nervous, Anxious, on Edge 3 3  Control/stop worrying 3 3  Worry too much - different things 3 3  Trouble relaxing 3 3  Restless 1 3  Easily annoyed or irritable 3 3  Afraid - awful might happen 3 3  Total GAD 7 Score 19 21  Anxiety Difficulty  Very difficult  -Recommended connecting with counselor or psychiatry for mental health support - patient declines and states she talks to her dog.  -Educated on Benefits of medication for symptom control October 2022: Patient stated her brother was given 5 months to live upon Cancer Dx and October is month 5. Spent the majority of the visit listening to her talk about her mental health Sept 2023: Patient doing better mentally. Her grandson is  visiting her in [place] and she was making him some chicken when I called     Osteoporosis / Osteopenia (Goal prevent fracture or broken bones) -controlled -Last DEXA Scan: T-Score AP  Spine 11/28/22: -3.5 07/04/16: -2.3 12/30/03: -2.4 T-Score Dual Femur Neck Left 11/28/22: -2.8 07/04/16: -2.3 12/30/03: -2.0 T-Score Dual Total Mean 11/28/22: -2.4 07/04/16: -1.6 12/30/03: -1.6 10-year probability of major osteoporotic fracture:  11/28/22: 19% 10-year probability of hip fracture: 11/28/22: 6.2% -Patient is not a candidate for pharmacologic treatment -Current treatment  [place] Appropriate, Effective, Safe, Accessible Calcium/Vit D Appropriate, Effective, Safe, Accessible -Medications previously tried: none reported  -Recommend [phone] units of vitamin D daily. Recommend 1200 mg of calcium daily from dietary and supplemental sources. Recommend weight-bearing and muscle strengthening exercises for building and maintaining bone density. -Counseled on diet and exercise extensively   Smoking Cessation (Goal: Quit) -Uncontrolled -Current treatment  None -Medications previously tried: None  Sept 2023: Patient is trying to cut back at this time. She can go an hour without smoking which is good for her. Her Brother passed May 4th 2023 of lung cancer and that inspired her to quit     CP F/U June 2024

## 2023-01-29 NOTE — Addendum Note (Signed)
Addended by: Zettie Pho on: 01/29/2023 04:00 PM   Modules accepted: Orders

## 2023-01-31 ENCOUNTER — Telehealth: Payer: Self-pay

## 2023-01-31 NOTE — Progress Notes (Signed)
Spoke with pt today about how to inject the Ozempic but it was unsuccessful doing this over the phone so pt and her sister are going to the nearest pharmacy to have them show her how to use it.   They were also confused about the Valsartan 320mg  being sent in after her Cardiologist took her off the Lisinopril due to low BP. She had stopped this on 01/11/23. She spoke with Harrold Donath on 01/29/23 and her BP were very high and Dr. Sedalia Muta sent in Valsartan 320mg  and her sister wanted to know why. I informed them of all the notes and that her BP was high is why they sent this in. I advised that she needs to check her BP first thing in the mornings before medication and an hour or so after medication to make sure her BP do not bottom out. Pt starts her new packs tomorrow with the Valsartan and she will call me tomorrow with an updated BP reading and I will follow up with her a week after starting the new medication. Pt understood everything.   01/26/23 155/85 80 01/27/23 170/84 89 01/28/23 178/88 72 01/29/23 128/70 74 01/30/23 122/69 89 01/31/23 122/69 89   [name], CMA Clinical Lobbyist  [phone]

## 2023-02-04 ENCOUNTER — Other Ambulatory Visit: Payer: Self-pay | Admitting: Family Medicine

## 2023-02-04 MED ORDER — VALSARTAN 80 MG PO TABS: 80.0000 mg | ORAL_TABLET | Freq: Every day | ORAL | 1 refills | Status: DC

## 2023-02-04 NOTE — Telephone Encounter (Signed)
Patient is feeling very dizzy and woozy. She states she almost fell at [facility] over the weekend   02/04/23:  0940: 143/75 HR 77 0947: 145/73 HR 84  02/04/23: 136 02/03/23: 139  Patient was on Valsartan 40mg  and now is on 320mg . Sent direct msg to PCP, PCP msg'd to cut pills in half. Patient is unable to cut in half. Sent another msg to PCP asking for Valsartan 80mg 

## 2023-02-06 ENCOUNTER — Telehealth: Payer: Self-pay

## 2023-02-06 NOTE — Progress Notes (Signed)
Care Management & Coordination Services Pharmacy Team  Reason for Encounter: Medication coordination and delivery  Contacted patient to discuss medications and coordinate delivery from [facility].  Spoke with patient on 02/06/2023   Cycle dispensing form sent to [name] for review.   Last adherence delivery date:01/18/23      Patient is due for next adherence delivery on: 02/18/23  This delivery to include: Adherence Packaging  30 Days  Montelukast 10mg  1 B Ozempic Inject 0.5mg  once a week on Thursdays Valsartan 80mg -1 B Vascepa 1gm 2 B 2 EM Ezetimibe 10mg  1 B Lansoprazole 30mg  1 BB Fenofibrate 160mg  1 B Rosuvastatin 20mg  1 BT Torsemide 20mg  1 BT Clopidogrel 75mg  1 B Lamotrigine 25mg  2 BT Gabapentin 300mg  1 B 1 BT       Xanax 0.5mg - 1 three times daily (Bottles)  Myrbetriq 50mg  1-B Nitrogylcerin 0.4mg -Place 1 tablet (0.4 mg total) under the tongue every 5 (five) minutes as needed.   Patient declined the following medications this month: Albuterol Inhaler-Pt has enough supply Valtrex 1000mg -Prn, does not need Albuterol Nebulizer 0.083% -Prn, does not need Docusate Sodium 100mg -Not taking currently Trelegy Ellipta 200-62.5-25mcg-Pt picks up at [facility] for $1.55 and wants to keep it there Flonase Nasal Spray- Plenty of supply only uses as needed  Percocet 10-325mg - No longer taking Test Strips/Lancets- Gets from [facility]. Cannot be billed through her insurance   Fetzima-D/C Side Effects Baclofen 10mg -Picked up at [facility] 01/25/23 90ds  Refills requested from providers include: Lamotrigine 25mg   Confirmed delivery date of 02/18/23, advised patient that pharmacy will contact them the morning of delivery.  Note: Pt is still having a lot of swelling in her feet and she stated they hurt so bad. Pt is taking the increased dose of Torsemide and wants to change it to BT packs. I advised pt I will follow up with her in a week or so after being on the new Valsartan 80mg  to check her  BP. She stated she is still feeling dizzy but Harrold Donath advised after being on these changes that if it doesn't resolve to let us know.   Any concerns about your medications? No  How often do you forget or accidentally miss a dose? Never  Do you use a pillbox? No  Is patient in packaging Yes  Recent blood pressure readings are as follows: 02/05/23 167/87, 155/87  Recent blood glucose readings are as follows: 02/06/23 135   Chart review: Recent office visits:  02/04/23 Blane Ohara MD. Orders Only. D/C Valsartan 320mg  and Ordered Valsartan 80mg  daily.   Recent consult visits:  None  Hospital visits:  None  Medications: Outpatient Encounter Medications as of 02/06/2023  Medication Sig   Semaglutide,0.25 or 0.5MG /DOS, (OZEMPIC, 0.25 OR 0.5 MG/DOSE,) 2 MG/3ML SOPN Inject 0.5 mg into the skin once a week.   albuterol (PROVENTIL) (2.5 MG/3ML) 0.083% nebulizer solution Take 3 mLs (2.5 mg total) by nebulization every 6 (six) hours as needed for wheezing or shortness of breath.   albuterol (VENTOLIN HFA) 108 (90 Base) MCG/ACT inhaler Inhale 2 puffs into the lungs every 4 (four) hours as needed for wheezing or shortness of breath.   ALPRAZolam (XANAX) 0.5 MG tablet Take 1 tablet (0.5 mg total) by mouth 3 (three) times daily as needed for anxiety.   Aspirin-Acetaminophen (GOODYS BODY PAIN PO) Take 1 packet by mouth daily.   baclofen (LIORESAL) 10 MG tablet Take 10 mg by mouth 3 (three) times daily as needed.   blood glucose meter kit and supplies KIT  Dispense based on patient and insurance preference. Use up to four times daily as directed.   clopidogrel (PLAVIX) 75 MG tablet TAKE ONE TABLET BY MOUTH EVERY MORNING   docusate sodium (COLACE) 100 MG capsule Take 100 mg by mouth 2 (two) times daily as needed for mild constipation. (Patient not taking: Reported on 01/22/2023)   ezetimibe (ZETIA) 10 MG tablet TAKE ONE TABLET BY MOUTH ONCE DAILY   fenofibrate 160 MG tablet TAKE ONE TABLET BY MOUTH EVERY  MORNING   fluconazole (DIFLUCAN) 150 MG tablet Take 150 mg by mouth once.   fluticasone (FLONASE) 50 MCG/ACT nasal spray INSTILL 1 SPRAY IN EACH NOSTRIL ONCE DAILY (Patient taking differently: Place 1 spray into both nostrils daily as needed for rhinitis.)   gabapentin (NEURONTIN) 300 MG capsule Take 1 capsule (300 mg total) by mouth 2 (two) times daily. TAKE ONE CAPSULE BY MOUTH EVERYDAY AT BEDTIME   glucose blood (ONETOUCH ULTRA) test strip DISPENSE BASED ON PATIENT AND INSURANCE PREFERENCE. USE UP TO FOUR TIMES DAILY AS DIRECTED.   lamoTRIgine (LAMICTAL) 25 MG tablet Take 2 tablets (50 mg total) by mouth at bedtime.   lansoprazole (PREVACID) 30 MG capsule TAKE ONE CAPSULE BY MOUTH BEFORE BREAKFAST   Levomilnacipran HCl ER (FETZIMA) 40 MG CP24 Take 40 mg by mouth daily.   mirabegron ER (MYRBETRIQ) 50 MG TB24 tablet Take 1 tablet (50 mg total) by mouth daily.   montelukast (SINGULAIR) 10 MG tablet Take 10 mg by mouth daily.   naloxone (NARCAN) nasal spray 4 mg/0.1 mL SMARTSIG:1 Both Nares Daily   nitroGLYCERIN (NITROSTAT) 0.4 MG SL tablet Place 1 tablet (0.4 mg total) under the tongue every 5 (five) minutes as needed.   oxyCODONE-acetaminophen (PERCOCET) 10-325 MG tablet Take 1 tablet by mouth every 6 (six) hours as needed for pain.   rosuvastatin (CRESTOR) 20 MG tablet TAKE ONE TABLET BY MOUTH EVERYDAY AT BEDTIME   torsemide (DEMADEX) 20 MG tablet Take 1 tablet (20 mg total) by mouth daily.   TRELEGY ELLIPTA 200-62.5-25 MCG/ACT AEPB Inhale 1 puff into the lungs daily.   TRUEplus Lancets 30G MISC 1 each by Does not apply route 2 (two) times daily. E11.69   valACYclovir (VALTREX) 1000 MG tablet TAKE TWO TABLETS BY MOUTH twice A DAY as needed   valsartan (DIOVAN) 80 MG tablet Take 1 tablet (80 mg total) by mouth daily.   VASCEPA 1 g capsule TAKE TWO CAPSULES BY MOUTH TWICE DAILY   Facility-Administered Encounter Medications as of 02/06/2023  Medication   sodium chloride flush (NS) 0.9 %  injection 3 mL   BP Readings from Last 3 Encounters:  01/22/23 136/64  01/11/23 107/67  01/02/23 112/62    Pulse Readings from Last 3 Encounters:  01/22/23 100  01/11/23 82  01/02/23 77    Lab Results  Component Value Date/Time   HGBA1C 7.7 (H) 01/23/2023 10:06 AM   HGBA1C 7.9 (H) 09/12/2022 09:17 AM   Lab Results  Component Value Date   CREATININE 0.70 01/23/2023   BUN 17 01/23/2023   GFRNONAA >60 10/19/2020   GFRAA 97 10/03/2020   NA 146 (H) 01/23/2023   K 4.7 01/23/2023   CALCIUM 9.1 01/23/2023   CO2 22 01/23/2023     [name], CMA Clinical Pharmacist Assistant  [phone]

## 2023-02-11 ENCOUNTER — Ambulatory Visit: Payer: Medicare Other | Admitting: Family Medicine

## 2023-02-11 ENCOUNTER — Telehealth: Payer: Self-pay

## 2023-02-11 NOTE — Telephone Encounter (Signed)
Called patient to see if she can come in tomorrow to see Dr. Sedalia Muta at 3 PM. Left voicemail for patient to call office

## 2023-02-11 NOTE — Telephone Encounter (Signed)
Appointment has been scheduled.

## 2023-02-12 ENCOUNTER — Ambulatory Visit (INDEPENDENT_AMBULATORY_CARE_PROVIDER_SITE_OTHER): Payer: Medicare Other | Admitting: Family Medicine

## 2023-02-12 VITALS — BP 122/68 | HR 85 | Temp 97.2°F | Ht 65.0 in | Wt 159.0 lb

## 2023-02-12 DIAGNOSIS — R5383 Other fatigue: Secondary | ICD-10-CM

## 2023-02-12 DIAGNOSIS — R0609 Other forms of dyspnea: Secondary | ICD-10-CM | POA: Diagnosis not present

## 2023-02-12 DIAGNOSIS — R29898 Other symptoms and signs involving the musculoskeletal system: Secondary | ICD-10-CM

## 2023-02-12 NOTE — Progress Notes (Signed)
Acute Office Visit  Subjective:    Patient ID: Sonya Small, female    DOB: [birth year], 72 y.o.   MRN: [id number]  Chief Complaint  Patient presents with   Fatigue    HPI: Patient is in today for fatigue, weakness and difficulty walking. States this is a ongoing issue for at least a couple of months. Currently taking valsartan 80 mg daily. Had some dizziness. BL leg and foot pain.  Having colored sputum. Varies in color and is thick.   Bp this morning. 161/87 , pulse 87. Before medication taken.  DYSPNEA ON EXERTION. No chest pain. Coughing.  Feet burning still. Saw podiatry this morning. ON gabapentin 300 mg one oral twice daily.   Past Medical History:  Diagnosis Date   Anxiety    CAD (coronary artery disease)    a. NSTEMI 11/2008 s/p DES to LCx (3.0x12 Xience); b. NSTEMI 01/2010 secondary to thrombotic RCA lesion (non-obstructive)-->med rx (integrilin x 24 hrs + plavix); c. 09/2012 negative Myoview.   Chronic diastolic CHF (congestive heart failure) (HCC)    a. 06/2014 Echo: EF 55-60%, no rwma, Gr1 DD, mild AI.   Depression    Dizziness    Drug induced constipation    Ganglion cyst of left foot 01/17/2021   Generalized hyperhidrosis    GERD (gastroesophageal reflux disease)    Headache    Hyperlipidemia    Hypertensive heart disease    Lumbar disc disease    Metabolic encephalopathy    Mixed hyperlipidemia    Myocardial infarction (HCC)    Obstructive sleep apnea    OP (osteoporosis)    Osteoarthritis    Osteoporosis    Other malaise    Overweight(278.02)    PAD (peripheral artery disease) (HCC)    a. Emboli to R foot 2010 from partially occlusive lesion in R EIA, s/p stenting. - followed by Dr. Arbie Cookey;  b. 10/2015 ABIs: R 1.03, L 0.97.   Restless leg    Sleep apnea    Stroke [facility])    TIA (transient ischemic attack)    Tobacco abuse    Urge incontinence     Past Surgical History:  Procedure Laterality Date   CYST EXCISION Left 01/2021   left foot   EYE  SURGERY     at age 69   hysterectomy -age 58     ILIAC ARTERY STENT     RIGHT ILIAC STENT   KNEE ARTHROSCOPY     LEFT HEART CATH AND CORONARY ANGIOGRAPHY N/A 03/05/2022   Procedure: LEFT HEART CATH AND CORONARY ANGIOGRAPHY;  Surgeon: Corky Crafts, MD;  Location: [facility];  Service: Cardiovascular;  Laterality: N/A;   LITHOTRIPSY Left 12/2020   LUMBAR LAMINECTOMY     TUBAL LIGATION      Family History  Problem Relation Age of Onset   Alzheimer's disease Mother    Hodgkin's lymphoma Brother    Lung cancer Brother    Hepatitis C Brother    Hypothyroidism Brother    Hypertension Brother    Depression Brother    Heart disease Other        Grandfather    Social History   Socioeconomic History   Marital status: Divorced    Spouse name: Not on file   Number of children: 3   Years of education: 10 th   Highest education level: Not on file  Occupational History   Occupation: DISABLED    Employer: UNEMPLOYED  Tobacco Use   Smoking status:  Every Day    Packs/day: 2.00    Years: 54.00    Additional pack years: 0.00    Total pack years: 108.00    Types: Cigarettes    Last attempt to quit: 11/20/2012    Years since quitting: 10.2   Smokeless tobacco: Never   Tobacco comments:    2 ppd for many years.     04/13/2022 Patient smokes 1/2 pack daily or more if she is nervous  Substance and Sexual Activity   Alcohol use: No    Alcohol/week: 0.0 standard drinks of alcohol   Drug use: No   Sexual activity: Never  Other Topics Concern   Not on file  Social History Narrative   Patient is single with 3 children, 1 deceased.   Patient is right handed.   Patient has 10 th grade education.   Patient drinks 5 or more cups daily.   Social Determinants of Health   Financial Resource Strain: Low Risk  (01/29/2023)   Overall Financial Resource Strain (CARDIA)    Difficulty of Paying Living Expenses: Not hard at all  Food Insecurity: No Food Insecurity (09/14/2022)    Hunger Vital Sign    Worried About Running Out of Food in the Last Year: Never true    Ran Out of Food in the Last Year: Never true  Transportation Needs: No Transportation Needs (01/29/2023)   PRAPARE - Administrator, Civil Service (Medical): No    Lack of Transportation (Non-Medical): No  Physical Activity: Inactive (09/14/2022)   Exercise Vital Sign    Days of Exercise per Week: 0 days    Minutes of Exercise per Session: 0 min  Stress: Stress Concern Present (09/14/2022)   [employer] of Occupational Health - Occupational Stress Questionnaire    Feeling of Stress : Rather much  Social Connections: Moderately Isolated (09/14/2022)   Social Connection and Isolation Panel [NHANES]    Frequency of Communication with Friends and Family: More than three times a week    Frequency of Social Gatherings with Friends and Family: Never    Attends Religious Services: More than 4 times per year    Active Member of [employer] or Organizations: No    Attends Banker Meetings: Never    Marital Status: Divorced  Catering manager Violence: Not At Risk (09/14/2022)   Humiliation, Afraid, Rape, and Kick questionnaire    Fear of Current or Ex-Partner: No    Emotionally Abused: No    Physically Abused: No    Sexually Abused: No    Outpatient Medications Prior to Visit  Medication Sig Dispense Refill   albuterol (PROVENTIL) (2.5 MG/3ML) 0.083% nebulizer solution Take 3 mLs (2.5 mg total) by nebulization every 6 (six) hours as needed for wheezing or shortness of breath. 75 mL 2   albuterol (VENTOLIN HFA) 108 (90 Base) MCG/ACT inhaler Inhale 2 puffs into the lungs every 4 (four) hours as needed for wheezing or shortness of breath.     ALPRAZolam (XANAX) 0.5 MG tablet Take 1 tablet (0.5 mg total) by mouth 3 (three) times daily as needed for anxiety. 90 tablet 1   Aspirin-Acetaminophen (GOODYS BODY PAIN PO) Take 1 packet by mouth daily.     baclofen (LIORESAL) 10 MG tablet Take 10  mg by mouth 3 (three) times daily as needed.     blood glucose meter kit and supplies KIT Dispense based on patient and insurance preference. Use up to four times daily as directed. 1 each 0  clopidogrel (PLAVIX) 75 MG tablet TAKE ONE TABLET BY MOUTH EVERY MORNING 90 tablet 0   docusate sodium (COLACE) 100 MG capsule Take 100 mg by mouth 2 (two) times daily as needed for mild constipation. (Patient not taking: Reported on 01/22/2023)     ezetimibe (ZETIA) 10 MG tablet TAKE ONE TABLET BY MOUTH ONCE DAILY 90 tablet 0   fenofibrate 160 MG tablet TAKE ONE TABLET BY MOUTH EVERY MORNING 90 tablet 0   fluticasone (FLONASE) 50 MCG/ACT nasal spray INSTILL 1 SPRAY IN EACH NOSTRIL ONCE DAILY (Patient taking differently: Place 1 spray into both nostrils daily as needed for rhinitis.) 16 g 1   gabapentin (NEURONTIN) 300 MG capsule Take 1 capsule (300 mg total) by mouth 2 (two) times daily. TAKE ONE CAPSULE BY MOUTH EVERYDAY AT BEDTIME 180 capsule 1   glucose blood (ONETOUCH ULTRA) test strip DISPENSE BASED ON PATIENT AND INSURANCE PREFERENCE. USE UP TO FOUR TIMES DAILY AS DIRECTED. 400 strip 3   lansoprazole (PREVACID) 30 MG capsule TAKE ONE CAPSULE BY MOUTH BEFORE BREAKFAST 90 capsule 3   Levomilnacipran HCl ER (FETZIMA) 40 MG CP24 Take 40 mg by mouth daily. 90 capsule 0   mirabegron ER (MYRBETRIQ) 50 MG TB24 tablet Take 1 tablet (50 mg total) by mouth daily. 30 tablet 2   montelukast (SINGULAIR) 10 MG tablet Take 10 mg by mouth daily.     naloxone (NARCAN) nasal spray 4 mg/0.1 mL SMARTSIG:1 Both Nares Daily     nitroGLYCERIN (NITROSTAT) 0.4 MG SL tablet Place 1 tablet (0.4 mg total) under the tongue every 5 (five) minutes as needed. 25 tablet 1   oxyCODONE-acetaminophen (PERCOCET) 10-325 MG tablet Take 1 tablet by mouth every 6 (six) hours as needed for pain.     rosuvastatin (CRESTOR) 20 MG tablet TAKE ONE TABLET BY MOUTH EVERYDAY AT BEDTIME 90 tablet 0   Semaglutide,0.25 or 0.5MG /DOS, (OZEMPIC, 0.25 OR 0.5  MG/DOSE,) 2 MG/3ML SOPN Inject 0.5 mg into the skin once a week. 3 mL 0   torsemide (DEMADEX) 20 MG tablet Take 1 tablet (20 mg total) by mouth daily. 30 tablet 2   TRELEGY ELLIPTA 200-62.5-25 MCG/ACT AEPB Inhale 1 puff into the lungs daily.     TRUEplus Lancets 30G MISC 1 each by Does not apply route 2 (two) times daily. E11.69 400 each 3   valACYclovir (VALTREX) 1000 MG tablet TAKE TWO TABLETS BY MOUTH twice A DAY as needed 20 tablet 2   valsartan (DIOVAN) 80 MG tablet Take 1 tablet (80 mg total) by mouth daily. 30 tablet 1   VASCEPA 1 g capsule TAKE TWO CAPSULES BY MOUTH TWICE DAILY 360 capsule 1   fluconazole (DIFLUCAN) 150 MG tablet Take 150 mg by mouth once.     lamoTRIgine (LAMICTAL) 25 MG tablet Take 2 tablets (50 mg total) by mouth at bedtime. 180 tablet 1   Facility-Administered Medications Prior to Visit  Medication Dose Route Frequency Provider Last Rate Last Admin   sodium chloride flush (NS) 0.9 % injection 3 mL  3 mL Intravenous Q12H [name], [name], MD        Allergies  Allergen Reactions   Abilify [Aripiprazole]     confusion   Latex Rash    Review of Systems  Constitutional:  Negative for chills, fatigue and fever.  HENT:  Negative for congestion, ear pain, rhinorrhea and sore throat.   Respiratory:  Positive for cough (Sometimes dry, productive) and shortness of breath.   Cardiovascular:  Negative for chest  pain.  Gastrointestinal:  Negative for abdominal pain, constipation, diarrhea, nausea and vomiting.  Genitourinary:  Negative for dysuria and urgency.  Musculoskeletal:  Negative for back pain and myalgias.  Neurological:  Negative for dizziness, weakness, light-headedness and headaches.  Psychiatric/Behavioral:  Negative for dysphoric mood. The patient is not nervous/anxious.        Objective:        02/12/2023    2:58 PM 01/22/2023    8:37 AM 01/11/2023    2:14 PM  Vitals with BMI  Height 5\' 5"  5\' 5"  5\' 5"   Weight 159 lbs 160 lbs 13 oz 158 lbs  BMI  26.46 26.76 26.29  Systolic 122 136 096  Diastolic 68 64 67  Pulse 85 100 82    No data found.   Physical Exam Vitals reviewed.  Constitutional:      General: She is not in acute distress.    Appearance: Normal appearance. She is normal weight.  HENT:     Right Ear: Tympanic membrane and ear canal normal.     Left Ear: Tympanic membrane and ear canal normal.     Nose: Nose normal. No congestion or rhinorrhea.     Mouth/Throat:     Pharynx: No oropharyngeal exudate or posterior oropharyngeal erythema.  Neck:     Thyroid: No thyroid mass.     Vascular: No carotid bruit.  Cardiovascular:     Rate and Rhythm: Normal rate and regular rhythm.     Pulses: Normal pulses.     Heart sounds: Normal heart sounds. No murmur heard. Pulmonary:     Effort: Pulmonary effort is normal.     Breath sounds: Normal breath sounds.  Abdominal:     General: Bowel sounds are normal.     Palpations: Abdomen is soft. There is no mass.     Tenderness: There is no abdominal tenderness.  Musculoskeletal:        General: Normal range of motion.  Lymphadenopathy:     Cervical: No cervical adenopathy.  Skin:    General: Skin is warm and dry.  Neurological:     Mental Status: She is alert and oriented to person, place, and time.     Cranial Nerves: No cranial nerve deficit.     Sensory: No sensory deficit.     Motor: No weakness.     Coordination: Coordination normal.     Gait: Gait normal.  Psychiatric:        Mood and Affect: Mood normal.        Behavior: Behavior normal.     Health Maintenance Due  Topic Date Due   DTaP/Tdap/Td (1 - Tdap) Never done   Colonoscopy  Never done   MAMMOGRAM  Never done   Zoster Vaccines- Shingrix (1 of 2) Never done   Lung Cancer Screening  03/02/2022   COVID-19 Vaccine (3 - 2023-24 season) 05/25/2022    There are no preventive care reminders to display for this patient.   Lab Results  Component Value Date   TSH 0.630 01/23/2023   Lab Results   Component Value Date   WBC 10.2 02/12/2023   HGB 13.3 02/12/2023   HCT 42.1 02/12/2023   MCV 88 02/12/2023   PLT 304 02/12/2023   Lab Results  Component Value Date   NA 146 (H) 02/12/2023   K 4.9 02/12/2023   CO2 26 02/12/2023   GLUCOSE 131 (H) 02/12/2023   BUN 24 02/12/2023   CREATININE 1.17 (H) 02/12/2023   BILITOT <0.2  02/12/2023   ALKPHOS 106 02/12/2023   AST 26 02/12/2023   ALT 22 02/12/2023   PROT 6.7 02/12/2023   ALBUMIN 4.2 02/12/2023   CALCIUM 9.1 02/12/2023   ANIONGAP 10 10/19/2020   EGFR 49 (L) 02/12/2023   Lab Results  Component Value Date   CHOL 99 (L) 01/23/2023   Lab Results  Component Value Date   HDL 42 01/23/2023   Lab Results  Component Value Date   LDLCALC 39 01/23/2023   Lab Results  Component Value Date   TRIG 94 01/23/2023   Lab Results  Component Value Date   CHOLHDL 2.4 01/23/2023   Lab Results  Component Value Date   HGBA1C 7.7 (H) 01/23/2023       Assessment & Plan:  Other fatigue -     CBC with Differential/Platelet -     Comprehensive metabolic panel -     Pro b natriuretic peptide (BNP) -     Sedimentation rate -     C-reactive protein  DOE (dyspnea on exertion) -     Pro b natriuretic peptide (BNP) -     DG Chest 2 View; Future  Weakness of both lower extremities -     Sedimentation rate -     C-reactive protein     No orders of the defined types were placed in this encounter.   Orders Placed This Encounter  Procedures   DG Chest 2 View   CBC with Differential/Platelet   Comprehensive metabolic panel   Pro b natriuretic peptide (BNP)   Sedimentation Rate   C-reactive protein     Follow-up: Return in about 2 months (around 04/25/2023) for chronic fasting.  An After Visit Summary was printed and given to the patient.  Blane Ohara, MD [facility] [phone]

## 2023-02-13 ENCOUNTER — Other Ambulatory Visit: Payer: Self-pay | Admitting: Family Medicine

## 2023-02-13 DIAGNOSIS — R5383 Other fatigue: Secondary | ICD-10-CM | POA: Insufficient documentation

## 2023-02-13 DIAGNOSIS — R29898 Other symptoms and signs involving the musculoskeletal system: Secondary | ICD-10-CM | POA: Insufficient documentation

## 2023-02-13 DIAGNOSIS — R0609 Other forms of dyspnea: Secondary | ICD-10-CM | POA: Insufficient documentation

## 2023-02-13 LAB — CBC WITH DIFFERENTIAL/PLATELET
Basophils Absolute: 0.1 10*3/uL (ref 0.0–0.2)
Basos: 1 %
EOS (ABSOLUTE): 0.1 10*3/uL (ref 0.0–0.4)
Eos: 1 %
Hematocrit: 42.1 % (ref 34.0–46.6)
Hemoglobin: 13.3 g/dL (ref 11.1–15.9)
Immature Grans (Abs): 0.1 10*3/uL (ref 0.0–0.1)
Immature Granulocytes: 1 %
Lymphocytes Absolute: 2.4 10*3/uL (ref 0.7–3.1)
Lymphs: 23 %
MCH: 27.8 pg (ref 26.6–33.0)
MCHC: 31.6 g/dL (ref 31.5–35.7)
MCV: 88 fL (ref 79–97)
Monocytes Absolute: 1 10*3/uL — ABNORMAL HIGH (ref 0.1–0.9)
Monocytes: 10 %
Neutrophils Absolute: 6.5 10*3/uL (ref 1.4–7.0)
Neutrophils: 64 %
Platelets: 304 10*3/uL (ref 150–450)
RBC: 4.78 x10E6/uL (ref 3.77–5.28)
RDW: 13.4 % (ref 11.7–15.4)
WBC: 10.2 10*3/uL (ref 3.4–10.8)

## 2023-02-13 LAB — COMPREHENSIVE METABOLIC PANEL
ALT: 22 IU/L (ref 0–32)
AST: 26 IU/L (ref 0–40)
Albumin/Globulin Ratio: 1.7 (ref 1.2–2.2)
Albumin: 4.2 g/dL (ref 3.8–4.8)
Alkaline Phosphatase: 106 IU/L (ref 44–121)
BUN/Creatinine Ratio: 21 (ref 12–28)
BUN: 24 mg/dL (ref 8–27)
Bilirubin Total: <0.2 mg/dL (ref 0.0–1.2)
CO2: 26 mmol/L (ref 20–29)
Calcium: 9.1 mg/dL (ref 8.7–10.3)
Chloride: 105 mmol/L (ref 96–106)
Creatinine, Ser: 1.17 mg/dL — ABNORMAL HIGH (ref 0.57–1.00)
Globulin, Total: 2.5 g/dL (ref 1.5–4.5)
Glucose: 131 mg/dL — ABNORMAL HIGH (ref 70–99)
Potassium: 4.9 mmol/L (ref 3.5–5.2)
Sodium: 146 mmol/L — ABNORMAL HIGH (ref 134–144)
Total Protein: 6.7 g/dL (ref 6.0–8.5)
eGFR: 49 mL/min/{1.73_m2} — ABNORMAL LOW (ref >59)

## 2023-02-13 LAB — SEDIMENTATION RATE: Sed Rate: 4 mm/hr (ref 0–40)

## 2023-02-13 LAB — PRO B NATRIURETIC PEPTIDE: NT-Pro BNP: 125 pg/mL (ref 0–301)

## 2023-02-13 LAB — C-REACTIVE PROTEIN: CRP: <1 mg/L (ref 0–10)

## 2023-02-15 NOTE — Progress Notes (Cosign Needed)
Spoke with pt today to check in on her BP readings after starting Valsartan 80mg  since 02/04/23 and Ozempic for blood sugars. Pt still is having dizziness along with swelling in her feet. Pt stated both her big toes are black.  Pt stated she does still have feeling in her feet.  Pt stated she recently saw her podiatrist, Dr. Kaylyn Layer and he checked the pressure in her feet and stated she has good pressure and her feet are fine. Just advised her to keep her compression socks on and elevate her feet. Pt stated she gets winded very easily but denies chest pain, or palpitations.  Blood pressure readings before medications 02/15/23 151/87  02/14/23 163/81 02/13/23 187/81 02/12/23 161/77 02/11/23 172/86  Fasting blood sugar readings 02/15/23 150 02/14/23 134 02/13/23 173 02/12/23 136 02/11/23 134   Sonya Small, CMA Clinical Lobbyist  [phone]

## 2023-02-17 ENCOUNTER — Encounter: Payer: Self-pay | Admitting: Family Medicine

## 2023-02-18 ENCOUNTER — Other Ambulatory Visit: Payer: Self-pay | Admitting: Family Medicine

## 2023-02-19 ENCOUNTER — Other Ambulatory Visit: Payer: Self-pay

## 2023-02-19 MED ORDER — OZEMPIC (1 MG/DOSE) 2 MG/1.5ML ~~LOC~~ SOPN: 1.0000 mg | PEN_INJECTOR | SUBCUTANEOUS | 0 refills | Status: DC

## 2023-02-19 MED ORDER — NITROGLYCERIN 0.4 MG SL SUBL: 0.4000 mg | SUBLINGUAL_TABLET | SUBLINGUAL | 1 refills | Status: DC | PRN

## 2023-02-24 NOTE — Progress Notes (Signed)
Request patient call cardiology as I have been unable to find an etiology for her dizziness or fatigue and I would like cardiology to assess and adjust her bp medicines.   Dr. Sedalia Muta

## 2023-02-25 ENCOUNTER — Ambulatory Visit (INDEPENDENT_AMBULATORY_CARE_PROVIDER_SITE_OTHER): Payer: Medicare Other

## 2023-02-25 ENCOUNTER — Telehealth: Payer: Self-pay

## 2023-02-25 DIAGNOSIS — M810 Age-related osteoporosis without current pathological fracture: Secondary | ICD-10-CM

## 2023-02-25 MED ORDER — ROMOSOZUMAB-AQQG 105 MG/1.17ML ~~LOC~~ SOSY
210.0000 mg | PREFILLED_SYRINGE | Freq: Once | SUBCUTANEOUS | Status: AC
Start: 2023-02-25 — End: 2023-02-25
  Administered 2023-02-25: 210 mg via SUBCUTANEOUS

## 2023-02-25 NOTE — Progress Notes (Signed)
Care Management & Coordination Services Pharmacy Team  Reason for Encounter: Appointment Reminder  Contacted patient to confirm telephone appointment with [name], PharmD on 02/27/23 at 10:30 am.  Unsuccessful outreach. Left voicemail for patient to return call.   Chart review:  Recent office visits:  02/19/23 [name] CMA. Orders Only. Increased Semaglutide from 0.5mg  to 1mg  weekly.   02/12/23 [name] MD. Seen for follow up. No med changes.   Recent consult visits:  02/12/23 [name] DPM (Podiatry). Seen for follow up. No med changes.   Hospital visits:  None   Star Rating Drugs:  Medication:  Last Fill: Day Supply Rosuvastatin  02/14/23-01/16/23 30ds Semaglutide   01/29/23   30ds Valsartan    02/15/23-02/05/23 30/14ds  Care Gaps: Annual wellness visit in last year? Yes Mammogram: Never done Colonoscopy: Never done   [name], [facility] Clinical Pharmacist Assistant  [phone]

## 2023-02-25 NOTE — Progress Notes (Signed)
   Patient: Sonya Small  DOB: [birth year]  MRN: [id number]    Visit Date: 02/25/2023    Sonya Small presents today for her monthly Evenity injection.  2 x 105 mg/1.17 ml Single-Dose Prefilled Syringes were given SQ, one in each arm.  Patient tolerated the injection well and has no questions.  The next Evenity injections will be due in one month.      Precious Reel, CMA

## 2023-02-27 ENCOUNTER — Ambulatory Visit: Payer: Medicare Other

## 2023-02-27 NOTE — Patient Outreach (Signed)
Care Management & Coordination Services Pharmacy Note  02/27/2023 Name:  Sonya Small MRN:  [id number] DOB:  [birth year]  Summary:  Very very very pleasant 72 year old female presents for f/u CCM visit Brother passed May 2023 of lung cancer. She misses him greatly and it has inspired her to try to quit smoking Her grandson was visiting her from [place] in Sept 2023 for 2 weeks. She was making him some chicken. She was very excited to be able to spend time with him  Recommendations/Changes made from today's visit: -Routed msg's to PCP and Cardio. See note for details    Subjective: Sonya Small is an 72 y.o. year old female who is a primary patient of [name], [name], MD.  The care coordination team was consulted for assistance with disease management and care coordination needs.    Engaged with patient by telephone for follow up visit.  Recent office visits:  02/19/23 [name] CMA. Orders Only. Increased Semaglutide from 0.5mg  to 1mg  weekly.    02/12/23 [name] MD. Seen for follow up. No med changes.    Recent consult visits:  02/12/23 [name] DPM (Podiatry). Seen for follow up. No med changes.    Hospital visits:  None   Objective:  Lab Results  Component Value Date   CREATININE 1.17 (H) 02/12/2023   BUN 24 02/12/2023   EGFR 49 (L) 02/12/2023   GFRNONAA >60 10/19/2020   GFRAA 97 10/03/2020   NA 146 (H) 02/12/2023   K 4.9 02/12/2023   CALCIUM 9.1 02/12/2023   CO2 26 02/12/2023   GLUCOSE 131 (H) 02/12/2023    Lab Results  Component Value Date/Time   HGBA1C 7.7 (H) 01/23/2023 10:06 AM   HGBA1C 7.9 (H) 09/12/2022 09:17 AM   MICROALBUR neg 02/27/2021 10:10 AM   MICROALBUR 80 11/25/2020 08:27 AM    Last diabetic Eye exam:  Lab Results  Component Value Date/Time   HMDIABEYEEXA No Retinopathy 09/04/2022 12:00 AM    Last diabetic Foot exam: No results found for: "HMDIABFOOTEX"   Lab Results  Component Value Date   CHOL 99 (L) 01/23/2023    HDL 42 01/23/2023   LDLCALC 39 01/23/2023   TRIG 94 01/23/2023   CHOLHDL 2.4 01/23/2023       Latest Ref Rng & Units 02/12/2023    3:51 PM 01/23/2023   10:06 AM 01/02/2023    4:22 PM  Hepatic Function  Total Protein 6.0 - 8.5 g/dL 6.7  6.3  6.0   Albumin 3.8 - 4.8 g/dL 4.2  3.8  4.0   AST 0 - 40 IU/L 26  29  27    ALT 0 - 32 IU/L 22  18  24    Alk Phosphatase 44 - 121 IU/L 106  55  55   Total Bilirubin 0.0 - 1.2 mg/dL <4.0  0.2  <9.8     Lab Results  Component Value Date/Time   TSH 0.630 01/23/2023 10:06 AM   TSH 0.605 09/12/2022 09:17 AM   FREET4 1.30 01/23/2023 10:06 AM   FREET4 1.21 06/11/2022 10:51 AM       Latest Ref Rng & Units 02/12/2023    3:51 PM 01/23/2023   10:06 AM 01/02/2023    4:22 PM  CBC  WBC 3.4 - 10.8 x10E3/uL 10.2  8.6  10.6   Hemoglobin 11.1 - 15.9 g/dL 11.9  14.7  82.9   Hematocrit 34.0 - 46.6 % 42.1  40.2  41.4   Platelets 150 - 450  x10E3/uL 304  281  327     Lab Results  Component Value Date/Time   VITAMINB12 408 10/26/2022 11:54 AM   VITAMINB12 417 10/03/2020 03:18 PM    Clinical ASCVD: Yes  The ASCVD Risk score (Arnett DK, et al., 2019) failed to calculate for the following reasons:   The patient has a prior MI or stroke diagnosis    Other: (CHADS2VASc if Afib, MMRC or CAT for COPD, ACT, DEXA)     01/22/2023    8:47 AM 09/14/2022    9:02 AM 08/28/2022    1:56 PM  Depression screen PHQ 2/9  Decreased Interest 0 3 3  Down, Depressed, Hopeless 3 3 3   PHQ - 2 Score 3 6 6   Altered sleeping 3 0 3  Tired, decreased energy 3 3 3   Change in appetite 3 0 1  Feeling bad or failure about yourself  1 0 0  Trouble concentrating 3 1 0  Moving slowly or fidgety/restless 3 0 3  Suicidal thoughts 0 0 0  PHQ-9 Score 19 10 16   Difficult doing work/chores Very difficult Somewhat difficult Very difficult     Social History   Tobacco Use  Smoking Status Every Day   Packs/day: 2.00   Years: 54.00   Additional pack years: 0.00   Total pack years:  108.00   Types: Cigarettes   Last attempt to quit: 11/20/2012   Years since quitting: 10.2  Smokeless Tobacco Never  Tobacco Comments   2 ppd for many years.    04/13/2022 Patient smokes 1/2 pack daily or more if she is nervous   BP Readings from Last 3 Encounters:  02/12/23 122/68  01/22/23 136/64  01/11/23 107/67   Pulse Readings from Last 3 Encounters:  02/12/23 85  01/22/23 100  01/11/23 82   Wt Readings from Last 3 Encounters:  02/12/23 159 lb (72.1 kg)  01/22/23 160 lb 12.8 oz (72.9 kg)  01/11/23 158 lb (71.7 kg)   BMI Readings from Last 3 Encounters:  02/12/23 26.46 kg/m  01/22/23 26.76 kg/m  01/11/23 26.29 kg/m    Allergies  Allergen Reactions   Abilify [Aripiprazole]     confusion   Latex Rash    Medications Reviewed Today     Reviewed by Blane Ohara, MD (Physician) on 02/17/23 at 1548  Med List Status: <None>   Medication Order Taking? Sig Documenting Provider Last Dose Status Informant  albuterol (PROVENTIL) (2.5 MG/3ML) 0.083% nebulizer solution [id number] No Take 3 mLs (2.5 mg total) by nebulization every 6 (six) hours as needed for wheezing or shortness of breath. [name], [name], MD Taking Active   albuterol (VENTOLIN HFA) 108 [id number] Base) MCG/ACT inhaler [id number] No Inhale 2 puffs into the lungs every 4 (four) hours as needed for wheezing or shortness of breath. [provider] Taking Active Self  ALPRAZolam (XANAX) 0.5 MG tablet [id number] No Take 1 tablet (0.5 mg total) by mouth 3 (three) times daily as needed for anxiety. Blane Ohara, MD Taking Active   Aspirin-Acetaminophen (GOODYS BODY PAIN PO) [id number] No Take 1 packet by mouth daily. [provider] Taking Active Self  baclofen (LIORESAL) 10 MG tablet [id number]  Take 10 mg by mouth 3 (three) times daily as needed. [provider]  Active   blood glucose meter kit and supplies KIT [id number] No Dispense based on patient and insurance preference. Use up to four times daily as  directed. [name], [name], MD Taking Active   clopidogrel (PLAVIX) 75 MG tablet [id number] No  TAKE ONE TABLET BY MOUTH EVERY MORNING [name], [name], MD Taking Active   docusate sodium (COLACE) 100 MG capsule [id number] No Take 100 mg by mouth 2 (two) times daily as needed for mild constipation.  Patient not taking: Reported on 01/22/2023   [provider] Not Taking Active   ezetimibe (ZETIA) 10 MG tablet [id number] No TAKE ONE TABLET BY MOUTH ONCE DAILY [name], [name], MD Taking Active   fenofibrate 160 MG tablet [id number] No TAKE ONE TABLET BY MOUTH EVERY MORNING [name], [name], MD Taking Active   fluticasone (FLONASE) 50 MCG/ACT nasal spray [id number] No INSTILL 1 SPRAY IN EACH NOSTRIL ONCE DAILY  Patient taking differently: Place 1 spray into both nostrils daily as needed for rhinitis.   [name], [name], MD Taking Active Self  gabapentin (NEURONTIN) 300 MG capsule [id number] No Take 1 capsule (300 mg total) by mouth 2 (two) times daily. TAKE ONE CAPSULE BY MOUTH EVERYDAY AT BEDTIME [name], [name], MD Taking Active   glucose blood (ONETOUCH ULTRA) test strip [id number] No DISPENSE BASED ON PATIENT AND INSURANCE PREFERENCE. USE UP TO FOUR TIMES DAILY AS DIRECTED. [name], [name], MD Taking Active   lamoTRIgine (LAMICTAL) 25 MG tablet [id number]  TAKE TWO TABLETS BY MOUTH EVERYDAY AT BEDTIME Blane Ohara, MD  Active   lansoprazole (PREVACID) 30 MG capsule [id number] No TAKE ONE CAPSULE BY MOUTH BEFORE BREAKFAST Marianne Sofia, PA-C Taking Active   Levomilnacipran HCl ER (FETZIMA) 40 MG CP24 [id number] No Take 40 mg by mouth daily. [name], [name], MD Taking Active   mirabegron ER (MYRBETRIQ) 50 MG TB24 tablet [id number] No Take 1 tablet (50 mg total) by mouth daily. [name], [name], MD Taking Active   montelukast (SINGULAIR) 10 MG tablet [id number] No Take 10 mg by mouth daily. [provider] Taking Active Self  naloxone [facility]) nasal spray 4 mg/0.1 mL [id number] No SMARTSIG:1 Both Nares Daily [provider] Taking Active   nitroGLYCERIN (NITROSTAT) 0.4 MG SL tablet [id number] No Place 1 tablet (0.4 mg total) under the tongue every 5 (five) minutes as needed. [name], [name], MD Taking Active   oxyCODONE-acetaminophen (PERCOCET) 10-325 MG tablet [id number] No Take 1 tablet by mouth every 6 (six) hours as needed for pain. [provider] Taking Active Self  rosuvastatin (CRESTOR) 20 MG tablet [id number] No TAKE ONE TABLET BY MOUTH EVERYDAY AT BEDTIME [name], [name], MD Taking Active   Semaglutide,0.25 or 0.5MG /DOS, (OZEMPIC, 0.25 OR 0.5 MG/DOSE,) 2 MG/3ML SOPN [id number] No Inject 0.5 mg into the skin once a week. [name], [name], MD Taking Active   sodium chloride flush (NS) 0.9 % injection 3 mL [id number]   [name], [name], MD  Active   torsemide [facility]) 20 MG tablet [id number] No Take 1 tablet (20 mg total) by mouth daily. Blane Ohara, MD Taking Active   TRELEGY ELLIPTA 200-62.5-25 MCG/ACT AEPB [id number] No Inhale 1 puff into the lungs daily. [provider] Taking Active   TRUEplus Lancets 30G MISC [id number] No 1 each by Does not apply route 2 (two) times daily. E11.69 CoxFritzi Mandes, MD Taking Active   valACYclovir (VALTREX) 1000 MG tablet [id number] No TAKE TWO TABLETS BY MOUTH twice A DAY as needed [name], [name], MD Taking Active   valsartan (DIOVAN) 80 MG tablet [id number] No Take 1 tablet (80 mg total) by mouth daily. Blane Ohara, MD Taking Active   VASCEPA 1 g capsule [id number] No TAKE TWO CAPSULES BY MOUTH TWICE DAILY [name], [name], MD Taking Active  SDOH:  (Social Determinants of Health) assessments and interventions performed: Yes SDOH Interventions    Flowsheet Row Care Coordination from 02/27/2023 in [facility] Care Coordination from 01/29/2023 in [facility] Office Visit from 12/06/2022 in [name] Office Visit from 09/14/2022 in [name] Office Visit from 08/28/2022 in [name] Chronic Care Management from 06/07/2022 in [name]  SDOH Interventions        Food Insecurity Interventions -- -- -- Intervention Not Indicated -- --  Housing Interventions -- -- -- Intervention Not Indicated -- --  Transportation Interventions Intervention Not Indicated Intervention Not Indicated -- Intervention Not Indicated -- Intervention Not Indicated  Utilities Interventions -- -- -- Intervention Not Indicated -- --  Alcohol Usage Interventions -- -- -- Intervention Not Indicated (Score <7) -- --  Depression Interventions/Treatment  -- -- Medication -- Medication --  Financial Strain Interventions Intervention Not Indicated Intervention Not Indicated -- Intervention Not Indicated -- Other (Comment)  Physical Activity Interventions -- -- -- Intervention Not Indicated -- --  Stress Interventions -- -- -- Intervention Not Indicated -- --  Social Connections Interventions -- -- -- Intervention Not Indicated -- --       Medication Assistance: None required.  Patient affirms current coverage meets needs.   Name and location of Current pharmacy:  [facility] - [place], [place] - [address] [place] [address] Phone: [phone] Fax: [phone]  [facility] [address] [place] - [address] [facility] [place] [address] Phone: [phone] Fax: [phone]  [facility] [address] [place] - [address] [place] [address] Phone: [phone] Fax: [phone]   Compliance/Adherence/Medication fill history: Star Rating Drugs:  Medication:                Last Fill:         Day Supply Rosuvastatin               02/14/23-01/16/23           30ds Semaglutide                01/29/23                          30ds Valsartan                     02/15/23-02/05/23           30/14ds   Care Gaps: Annual wellness visit in last year?  Yes Mammogram: Never done Colonoscopy: Never done   Assessment/Plan  Hypertension (BP goal <140/80) BP Readings from Last 3 Encounters:  02/12/23 122/68  01/22/23 136/64  01/11/23 107/67    Pulse Readings from Last 3 Encounters:  02/12/23 85  01/22/23 100  01/11/23 82  -controlled -Current treatment: Valsartan 80mg  Appropriate, Query effective,  -Medications previously tried: amlodipine, Lisinopril/HCTZ (polyuria) -Current home readings:  May 2024: 01/29/23: 178/88   HR 72 170/84 155/85 147/90 -Current dietary habits: enjoying fruit. Cooks at home. Reports limited appetite since brother has been ill.  -Current exercise habits: minimal due to back pain and breathing  -Denies hypotensive/hypertensive symptoms -Educated on BP goals and benefits of medications for prevention of heart attack, stroke and kidney damage; Daily salt intake goal < 2300 mg; Importance of home blood pressure monitoring; -Counseled  to monitor BP at home daily, document, and provide log at future appointments -Counseled on diet and exercise extensively May 2024: Patient's BP elevated. Sent direct msg to PCP asking to double Valsartan -PCP responded and sent new dose of Valsartan. Coordinate with [facility] to get sent June 2024: Patient still feeling dizzy. Per PCP, patient needs to see Cardio. Appt scheduled for October. Sent msg to Cardio asking to see her sooner   Hyperlipidemia/CAD: (LDL goal < 55) The ASCVD Risk score (Arnett DK, et al., 2019) failed to calculate for the following reasons:   The patient has a prior MI or stroke diagnosis Lab Results  Component Value Date   CHOL 99 (L) 01/23/2023   CHOL 113 09/12/2022   CHOL 147 03/01/2022   Lab Results  Component Value Date   HDL 42 01/23/2023   HDL 40 09/12/2022   HDL 41 03/01/2022   Lab Results  Component Value Date   LDLCALC 39 01/23/2023   LDLCALC 52 09/12/2022   LDLCALC 82 03/01/2022   Lab Results  Component Value Date   TRIG 94  01/23/2023   TRIG 113 09/12/2022   TRIG 134 03/01/2022   Lab Results  Component Value Date   CHOLHDL 2.4 01/23/2023   CHOLHDL 2.8 09/12/2022   CHOLHDL 3.6 03/01/2022   No results found for: "LDLDIRECT" Last vitamin D No results found for: "25OHVITD2", "25OHVITD3", "VD25OH" Lab Results  Component Value Date   TSH 0.630 01/23/2023   -Patient had a NSTEMI in March 2010 and underwent DES to left circumflex.  She had another NSTEMI in May 2011 secondary to thrombotic RCA lesion, cardiac catheterization showed nonobstructive disease, medical therapy was recommended.  She was admitted for recurrent chest pain in January 2014, Myoview was negative.  She was admitted for acute CVA in October 2015 after presenting with slurred speech and facial drooping.  MRI showed acute right MCA infarct involving basal ganglia and periventricular white matter.  She was started on aspirin along with Plavix.  Myoview obtained in August 2017 showed no ischemia, normal EF.   ABI obtained in July 2018 was normal.    -She was seen by Azalee Course PA-C on 03/13/2018  with chest pain.  A Myoview study on 03/25/2018 which showed EF 52%.    -She was seen in the ED on 10/19/20 with acute mid thoracic pain. Started left scapula and moved to the right. She states Ntg did help. Troponin was normal x 2. Ecg showed mild ST depression in leads V5-6. CXR normal. Other labs normal. Pain lasted about 30 minutes  -When seen last year she noted that she has had pain across her shoulder blades and upper back for several months. It is worse with activity. She does get relief with pain medication and Ntg.   -When seen in June she ws having worsening chest pain. She has been under a lot of stress. She was caring for her brother and he died this past month with lung CA. She has complained of chest pain since then. Worse when stressed. Some relief with sl Ntg but she doesn't like to take. She notes pain across her anterior chest and into her back.  Doesn't feel well. Has trouble staying awake. Fingers are numb. Notes toes are blue. She underwent cardiac cath which showed no significant obstructive CAD. She also had follow up LE arterial vascular dopplers which were stable with good ABIs.  -uncontrolled -Current treatment: rosuvastatin 20 mg daily at bedtime Appropriate, Effective, Safe, Accessible Fenofibrate 160  mg daily am Appropriate, Effective, Safe, Accessible Clopidogrel 75 mg daily Appropriate, Effective, Safe, Accessible Vascepa 1 gram 2 capsules bid Appropriate, Effective, Safe, Accessible -Medications previously tried: none reported -Current dietary patterns: cooks at home. Eats meatloaf, pinto beans, stewed potatoes, homemade biscuits -Current exercise habits: limited due to back pain and shortness of breath  -Educated on Cholesterol goals;  Benefits of statin for ASCVD risk reduction; Importance of limiting foods high in cholesterol; -Counseled on diet and exercise extensively Recommended to continue current medication.    Diabetes (A1c goal <7%) -controlled -Current medications: Trulicity 4.5 mg weekly Appropriate, Query effective,  -Medications previously tried: [place] (Vaginal itching) -Current home glucose readings fasting glucose:  Sept 2023: 05/26/22: 183 05/27/22: 167 05/30/22: 149 05/31/22: 170 06/01/22: 170 06/02/22: 161 06/03/22: 145 06/04/22: 123 06/05/22: 146 06/06/22: 126 06/07/22: 150 -Denies hypoglycemic/hyperglycemic symptoms -Current meal patterns:  breakfast: fruit  Lunch and supper:  variety of meat and vegtables drinks: nature's twist, water, diet drinks -Current exercise: limited due to back pain and breathing -Educated onA1c and blood sugar goals; Complications of diabetes including kidney damage, retinal damage, and cardiovascular disease; Benefits of routine self-monitoring of blood sugar; -Counseled to check feet daily and get yearly eye exams -Counseled on diet and exercise extensively Sept  2023: PCP increased Trulicity but patient hasn't started yet. Will wait to see results of increased dose May 2024: Patient refuses insulin. Unable to get Trulicity, out of stock everywhere. Patient agrees to trial Ozempic for a month until Trulicity. PCP sent new script   Depression/Anxiety (Goal: manage symptoms) -Uncontrolled -Current treatment: Lamotrigine 100 mg daily Appropriate, Effective, Safe, Accessible Alprazolam 0.5 mg tid prn Appropriate, Effective, Safe, Accessible -Medications previously tried/failed: Abilify  -PHQ9:     01/22/2023    8:47 AM 09/14/2022    9:02 AM 08/28/2022    1:56 PM  Depression screen PHQ 2/9  Decreased Interest 0 3 3  Down, Depressed, Hopeless 3 3 3   PHQ - 2 Score 3 6 6   Altered sleeping 3 0 3  Tired, decreased energy 3 3 3   Change in appetite 3 0 1  Feeling bad or failure about yourself  1 0 0  Trouble concentrating 3 1 0  Moving slowly or fidgety/restless 3 0 3  Suicidal thoughts 0 0 0  PHQ-9 Score 19 10 16   Difficult doing work/chores Very difficult Somewhat difficult Very difficult  -GAD7:     05/08/2021   11:04 AM 08/10/2020    1:33 PM  GAD 7 : Generalized Anxiety Score  Nervous, Anxious, on Edge 3 3  Control/stop worrying 3 3  Worry too much - different things 3 3  Trouble relaxing 3 3  Restless 1 3  Easily annoyed or irritable 3 3  Afraid - awful might happen 3 3  Total GAD 7 Score 19 21  Anxiety Difficulty  Very difficult  -Recommended connecting with counselor or psychiatry for mental health support - patient declines and states she talks to her dog.  -Educated on Benefits of medication for symptom control October 2022: Patient stated her brother was given 5 months to live upon Cancer Dx and October is month 5. Spent the majority of the visit listening to her talk about her mental health Sept 2023: Patient doing better mentally. Her grandson is visiting her in [place] and she was making him some chicken when I called      Osteoporosis / Osteopenia (Goal prevent fracture or broken bones) -controlled -Last DEXA Scan: T-Score AP Spine 11/28/22: -  3.5 07/04/16: -2.3 12/30/03: -2.4 T-Score Dual Femur Neck Left 11/28/22: -2.8 07/04/16: -2.3 12/30/03: -2.0 T-Score Dual Total Mean 11/28/22: -2.4 07/04/16: -1.6 12/30/03: -1.6 10-year probability of major osteoporotic fracture:  11/28/22: 19% 10-year probability of hip fracture: 11/28/22: 6.2% -Patient is not a candidate for pharmacologic treatment -Current treatment  Evenity Appropriate, Effective, Safe, Accessible Calcium/Vit D Appropriate, Effective, Safe, Accessible -Medications previously tried: none reported  -Recommend [phone] units of vitamin D daily. Recommend 1200 mg of calcium daily from dietary and supplemental sources. Recommend weight-bearing and muscle strengthening exercises for building and maintaining bone density. June 2024: Patient states that since starting the Evenity shots, her legs are swollen. She is unable to wear her normal shoes some days due to this. Sent msg to PCP and will have Danielle call Podiatry to see if patient can be re-assessed   Smoking Cessation (Goal: Quit) -Uncontrolled -Current treatment  None -Medications previously tried: None  Sept 2023: Patient is trying to cut back at this time. She can go an hour without smoking which is good for her. Her Brother passed May 4th 2023 of lung cancer and that inspired her to quit     CP F/U July 2024

## 2023-02-27 NOTE — Progress Notes (Cosign Needed)
Called the podiatrist office and spoke with [name]. She has made this appt for   Dr. Kaylyn Layer in [facility]  02/28/23 @ 10:10 am  Pt has been informed and can make the appt.    [name], CMA Clinical Pharmacist Assistant  [phone]

## 2023-02-28 ENCOUNTER — Telehealth: Payer: Self-pay

## 2023-02-28 NOTE — Telephone Encounter (Signed)
Spoke to patient Dr.Jordan received a message from your podiatrist about increased swelling in both lower legs and feet.He advised schedule appointment with app soon.Stated she needs a afternoon appointment.Appointment scheduled with Bernadene Person NP 6/14 at 2:45 pm.

## 2023-02-28 NOTE — Telephone Encounter (Signed)
Patient called stating that She tried to give her ozempic this morning, and the medication would not come out. She states that she tried it two different times and it still would not deliver. I told patient since it was 450 pm when she called I would have to get the ok from Dr. Sedalia Muta first thing in the morning but if its ok can we give patient a sample Pen to get her through and have her come get first thing in the morning. Please advise.

## 2023-03-01 ENCOUNTER — Ambulatory Visit (INDEPENDENT_AMBULATORY_CARE_PROVIDER_SITE_OTHER): Payer: Medicare Other | Admitting: Family Medicine

## 2023-03-01 ENCOUNTER — Encounter: Payer: Self-pay | Admitting: Family Medicine

## 2023-03-01 VITALS — BP 152/68 | HR 68 | Temp 97.8°F | Ht 65.0 in | Wt 164.0 lb

## 2023-03-01 DIAGNOSIS — I5042 Chronic combined systolic (congestive) and diastolic (congestive) heart failure: Secondary | ICD-10-CM

## 2023-03-01 DIAGNOSIS — I110 Hypertensive heart disease with heart failure: Secondary | ICD-10-CM

## 2023-03-01 DIAGNOSIS — J449 Chronic obstructive pulmonary disease, unspecified: Secondary | ICD-10-CM | POA: Diagnosis not present

## 2023-03-01 MED ORDER — TORSEMIDE 40 MG PO TABS
1.0000 | ORAL_TABLET | Freq: Every day | ORAL | 2 refills | Status: DC
Start: 2023-03-01 — End: 2023-05-06

## 2023-03-01 MED ORDER — VALSARTAN 160 MG PO TABS
160.0000 mg | ORAL_TABLET | Freq: Every day | ORAL | 3 refills | Status: DC
Start: 2023-03-01 — End: 2023-06-10

## 2023-03-01 MED ORDER — GUAIFENESIN ER 600 MG PO TB12
600.0000 mg | ORAL_TABLET | Freq: Two times a day (BID) | ORAL | 5 refills | Status: DC
Start: 2023-03-01 — End: 2023-10-29

## 2023-03-01 NOTE — Patient Instructions (Addendum)
Increase valsartan to 160 mg daily.  Increase torsemide to 40 mg daily.  Start guaifenesin 600 mg twice daily.

## 2023-03-01 NOTE — Telephone Encounter (Signed)
Patient informed, patient coming by to pick up sample.

## 2023-03-01 NOTE — Progress Notes (Unsigned)
Subjective:  Patient ID: Sonya Small, female    DOB: Sep 02, 1951  Age: 72 y.o. MRN: [id number]  Chief Complaint  Patient presents with   Hypertension    Hypertension: Patient presents with High blood pressure readings from home ranging from 170/90 to 150/80. Patient states she has had headaches, but denies any chest pain or shortness of breath.        01/22/2023    8:47 AM 09/14/2022    9:02 AM 08/28/2022    1:56 PM 01/24/2022    9:16 AM 10/18/2021   10:44 PM  Depression screen PHQ 2/9  Decreased Interest 0 3 3 2 3   Down, Depressed, Hopeless 3 3 3 3 2   PHQ - 2 Score 3 6 6 5 5   Altered sleeping 3 0 3 2 3   Tired, decreased energy 3 3 3 3 3   Change in appetite 3 0 1 3 0  Feeling bad or failure about yourself  1 0 0 3 2  Trouble concentrating 3 1 0 2 3  Moving slowly or fidgety/restless 3 0 3 0 2  Suicidal thoughts 0 0 0 0 0  PHQ-9 Score 19 10 16 18 18   Difficult doing work/chores Very difficult Somewhat difficult Very difficult Somewhat difficult Somewhat difficult        01/22/2023    8:46 AM  Fall Risk   Falls in the past year? 1  Number falls in past yr: 1  Risk for fall due to : History of fall(s)  Follow up Falls evaluation completed;Falls prevention discussed    Patient Care Team: Blane Ohara, MD as PCP - General (Family Medicine) [name], [name], MD as PCP - Cardiology (Cardiology)   Review of Systems  Constitutional:  Positive for fatigue. Negative for appetite change and fever.  HENT:  Positive for rhinorrhea. Negative for congestion, ear pain, sinus pressure and sore throat.   Respiratory:  Positive for cough (phlegm in am.). Negative for chest tightness, shortness of breath and wheezing.   Cardiovascular:  Positive for palpitations (with exertion.) and leg swelling. Negative for chest pain.  Gastrointestinal:  Positive for diarrhea. Negative for abdominal pain, constipation, nausea and vomiting.  Genitourinary:  Negative for dysuria and hematuria.        Bladder incontinence: urge incontinence.   Musculoskeletal:  Positive for myalgias. Negative for arthralgias, back pain and joint swelling.  Skin:  Negative for rash.  Neurological:  Positive for dizziness, weakness and headaches.  Psychiatric/Behavioral:  Positive for dysphoric mood. Negative for suicidal ideas. The patient is nervous/anxious.     Current Outpatient Medications on File Prior to Visit  Medication Sig Dispense Refill   albuterol (PROVENTIL) (2.5 MG/3ML) 0.083% nebulizer solution Take 3 mLs (2.5 mg total) by nebulization every 6 (six) hours as needed for wheezing or shortness of breath. 75 mL 2   albuterol (VENTOLIN HFA) 108 (90 Base) MCG/ACT inhaler Inhale 2 puffs into the lungs every 4 (four) hours as needed for wheezing or shortness of breath.     ALPRAZolam (XANAX) 0.5 MG tablet Take 1 tablet (0.5 mg total) by mouth 3 (three) times daily as needed for anxiety. 90 tablet 1   Aspirin-Acetaminophen (GOODYS BODY PAIN PO) Take 1 packet by mouth daily.     baclofen (LIORESAL) 10 MG tablet Take 10 mg by mouth 3 (three) times daily as needed.     blood glucose meter kit and supplies KIT Dispense based on patient and insurance preference. Use up to four times daily as  directed. 1 each 0   clopidogrel (PLAVIX) 75 MG tablet TAKE ONE TABLET BY MOUTH EVERY MORNING 90 tablet 0   docusate sodium (COLACE) 100 MG capsule Take 100 mg by mouth 2 (two) times daily as needed for mild constipation.     ezetimibe (ZETIA) 10 MG tablet TAKE ONE TABLET BY MOUTH ONCE DAILY 90 tablet 0   fenofibrate 160 MG tablet TAKE ONE TABLET BY MOUTH EVERY MORNING 90 tablet 0   fluticasone (FLONASE) 50 MCG/ACT nasal spray INSTILL 1 SPRAY IN EACH NOSTRIL ONCE DAILY (Patient taking differently: Place 1 spray into both nostrils daily as needed for rhinitis.) 16 g 1   gabapentin (NEURONTIN) 300 MG capsule Take 1 capsule (300 mg total) by mouth 2 (two) times daily. TAKE ONE CAPSULE BY MOUTH EVERYDAY AT BEDTIME 180 capsule  1   glucose blood (ONETOUCH ULTRA) test strip DISPENSE BASED ON PATIENT AND INSURANCE PREFERENCE. USE UP TO FOUR TIMES DAILY AS DIRECTED. 400 strip 3   lamoTRIgine (LAMICTAL) 25 MG tablet TAKE TWO TABLETS BY MOUTH EVERYDAY AT BEDTIME 180 tablet 0   lansoprazole (PREVACID) 30 MG capsule TAKE ONE CAPSULE BY MOUTH BEFORE BREAKFAST 90 capsule 3   Levomilnacipran HCl ER (FETZIMA) 40 MG CP24 Take 40 mg by mouth daily. 90 capsule 0   mirabegron ER (MYRBETRIQ) 50 MG TB24 tablet Take 1 tablet (50 mg total) by mouth daily. 30 tablet 2   montelukast (SINGULAIR) 10 MG tablet Take 10 mg by mouth daily.     naloxone (NARCAN) nasal spray 4 mg/0.1 mL SMARTSIG:1 Both Nares Daily     nitroGLYCERIN (NITROSTAT) 0.4 MG SL tablet Place 1 tablet (0.4 mg total) under the tongue every 5 (five) minutes as needed. 25 tablet 1   oxyCODONE-acetaminophen (PERCOCET) 10-325 MG tablet Take 1 tablet by mouth every 6 (six) hours as needed for pain.     rosuvastatin (CRESTOR) 20 MG tablet TAKE ONE TABLET BY MOUTH EVERYDAY AT BEDTIME 90 tablet 0   Semaglutide, 1 MG/DOSE, (OZEMPIC, 1 MG/DOSE,) 2 MG/1.5ML SOPN Inject 1 mg into the skin once a week. 1.5 mL 0   torsemide (DEMADEX) 20 MG tablet Take 1 tablet (20 mg total) by mouth daily. 30 tablet 2   TRELEGY ELLIPTA 200-62.5-25 MCG/ACT AEPB Inhale 1 puff into the lungs daily.     TRUEplus Lancets 30G MISC 1 each by Does not apply route 2 (two) times daily. E11.69 400 each 3   valACYclovir (VALTREX) 1000 MG tablet TAKE TWO TABLETS BY MOUTH twice A DAY as needed 20 tablet 2   valsartan (DIOVAN) 80 MG tablet Take 1 tablet (80 mg total) by mouth daily. 30 tablet 1   VASCEPA 1 g capsule TAKE TWO CAPSULES BY MOUTH TWICE DAILY 360 capsule 1   No current facility-administered medications on file prior to visit.   Past Medical History:  Diagnosis Date   Anxiety    CAD (coronary artery disease)    a. NSTEMI 11/2008 s/p DES to LCx (3.0x12 Xience); b. NSTEMI 01/2010 secondary to thrombotic RCA  lesion (non-obstructive)-->med rx (integrilin x 24 hrs + plavix); c. 09/2012 negative Myoview.   Chronic diastolic CHF (congestive heart failure) (HCC)    a. 06/2014 Echo: EF 55-60%, no rwma, Gr1 DD, mild AI.   Depression    Dizziness    Drug induced constipation    Ganglion cyst of left foot 01/17/2021   Generalized hyperhidrosis    GERD (gastroesophageal reflux disease)    Headache    Hyperlipidemia  Hypertensive heart disease    Lumbar disc disease    Metabolic encephalopathy    Mixed hyperlipidemia    Myocardial infarction (HCC)    Obstructive sleep apnea    OP (osteoporosis)    Osteoarthritis    Osteoporosis    Other malaise    Overweight(278.02)    PAD (peripheral artery disease) (HCC)    a. Emboli to R foot 2010 from partially occlusive lesion in R EIA, s/p stenting. - followed by Dr. Arbie Cookey;  b. 10/2015 ABIs: R 1.03, L 0.97.   Restless leg    Sleep apnea    Stroke [facility])    TIA (transient ischemic attack)    Tobacco abuse    Urge incontinence    Past Surgical History:  Procedure Laterality Date   CYST EXCISION Left 01/2021   left foot   EYE SURGERY     at age 73   hysterectomy -age 41     ILIAC ARTERY STENT     RIGHT ILIAC STENT   KNEE ARTHROSCOPY     LEFT HEART CATH AND CORONARY ANGIOGRAPHY N/A 03/05/2022   Procedure: LEFT HEART CATH AND CORONARY ANGIOGRAPHY;  Surgeon: Corky Crafts, MD;  Location: [facility];  Service: Cardiovascular;  Laterality: N/A;   LITHOTRIPSY Left 12/2020   LUMBAR LAMINECTOMY     TUBAL LIGATION      Family History  Problem Relation Age of Onset   Alzheimer's disease Mother    Hodgkin's lymphoma Brother    Lung cancer Brother    Hepatitis C Brother    Hypothyroidism Brother    Hypertension Brother    Depression Brother    Heart disease Other        Grandfather   Social History   Socioeconomic History   Marital status: Divorced    Spouse name: Not on file   Number of children: 3   Years of education: 10 th    Highest education level: Not on file  Occupational History   Occupation: DISABLED    Employer: UNEMPLOYED  Tobacco Use   Smoking status: Every Day    Packs/day: 2.00    Years: 54.00    Additional pack years: 0.00    Total pack years: 108.00    Types: Cigarettes    Last attempt to quit: 11/20/2012    Years since quitting: 10.2   Smokeless tobacco: Never   Tobacco comments:    2 ppd for many years.     04/13/2022 Patient smokes 1/2 pack daily or more if she is nervous  Substance and Sexual Activity   Alcohol use: No    Alcohol/week: 0.0 standard drinks of alcohol   Drug use: No   Sexual activity: Never  Other Topics Concern   Not on file  Social History Narrative   Patient is single with 3 children, 1 deceased.   Patient is right handed.   Patient has 10 th grade education.   Patient drinks 5 or more cups daily.   Social Determinants of Health   Financial Resource Strain: Low Risk  (02/27/2023)   Overall Financial Resource Strain (CARDIA)    Difficulty of Paying Living Expenses: Not hard at all  Food Insecurity: No Food Insecurity (09/14/2022)   Hunger Vital Sign    Worried About Running Out of Food in the Last Year: Never true    Ran Out of Food in the Last Year: Never true  Transportation Needs: No Transportation Needs (02/27/2023)   PRAPARE - Transportation  Lack of Transportation (Medical): No    Lack of Transportation (Non-Medical): No  Physical Activity: Inactive (09/14/2022)   Exercise Vital Sign    Days of Exercise per Week: 0 days    Minutes of Exercise per Session: 0 min  Stress: Stress Concern Present (09/14/2022)   [employer] of Occupational Health - Occupational Stress Questionnaire    Feeling of Stress : Rather much  Social Connections: Moderately Isolated (09/14/2022)   Social Connection and Isolation Panel [NHANES]    Frequency of Communication with Friends and Family: More than three times a week    Frequency of Social Gatherings with Friends  and Family: Never    Attends Religious Services: More than 4 times per year    Active Member of [employer] or Organizations: No    Attends Engineer, structural: Never    Marital Status: Divorced    Objective:  BP (!) 152/68 (BP Location: Left Arm, Patient Position: Sitting)   Pulse 68   Temp 97.8 F (36.6 C) (Temporal)   Ht 5\' 5"  (1.651 m)   Wt 164 lb (74.4 kg)   SpO2 97%   BMI 27.29 kg/m      03/01/2023    9:55 AM 02/12/2023    2:58 PM 01/22/2023    8:37 AM  BP/Weight  Systolic BP 152 122 136  Diastolic BP 68 68 64  Wt. (Lbs) 164 159 160.8  BMI 27.29 kg/m2 26.46 kg/m2 26.76 kg/m2    Physical Exam Vitals reviewed.  Constitutional:      Appearance: Normal appearance. She is normal weight.  Neck:     Vascular: No carotid bruit.  Cardiovascular:     Rate and Rhythm: Normal rate and regular rhythm.     Heart sounds: Normal heart sounds.  Pulmonary:     Effort: Pulmonary effort is normal. No respiratory distress.     Breath sounds: Normal breath sounds.  Abdominal:     General: Abdomen is flat. Bowel sounds are normal.     Palpations: Abdomen is soft.     Tenderness: There is no abdominal tenderness.  Neurological:     Mental Status: She is alert and oriented to person, place, and time.  Psychiatric:        Mood and Affect: Mood normal.        Behavior: Behavior normal.     Diabetic Foot Exam - Simple   No data filed      Lab Results  Component Value Date   WBC 10.2 02/12/2023   HGB 13.3 02/12/2023   HCT 42.1 02/12/2023   PLT 304 02/12/2023   GLUCOSE 131 (H) 02/12/2023   CHOL 99 (L) 01/23/2023   TRIG 94 01/23/2023   HDL 42 01/23/2023   LDLCALC 39 01/23/2023   ALT 22 02/12/2023   AST 26 02/12/2023   NA 146 (H) 02/12/2023   K 4.9 02/12/2023   CL 105 02/12/2023   CREATININE 1.17 (H) 02/12/2023   BUN 24 02/12/2023   CO2 26 02/12/2023   TSH 0.630 01/23/2023   INR 1.0 01/31/2019   HGBA1C 7.7 (H) 01/23/2023   MICROALBUR neg 02/27/2021       Assessment & Plan:    There are no diagnoses linked to this encounter.   No orders of the defined types were placed in this encounter.   No orders of the defined types were placed in this encounter.    Follow-up: No follow-ups on file.   I,Lauren M Auman,acting as a Neurosurgeon for [employer]  Latrecia Capito, MD.,have documented all relevant documentation on the behalf of Blane Ohara, MD,as directed by  Blane Ohara, MD while in the presence of Blane Ohara, MD.   An After Visit Summary was printed and given to the patient.  Blane Ohara, MD [facility] [phone]

## 2023-03-02 NOTE — Assessment & Plan Note (Signed)
Increase valsartan to 160 mg daily.  Increase torsemide to 40 mg daily.

## 2023-03-06 ENCOUNTER — Other Ambulatory Visit: Payer: Medicare Other

## 2023-03-06 ENCOUNTER — Telehealth: Payer: Self-pay

## 2023-03-06 NOTE — Progress Notes (Cosign Needed)
Care Management & Coordination Services Pharmacy Team  Reason for Encounter: Medication coordination and delivery  Contacted patient to discuss medications and coordinate delivery from [facility].  Spoke with patient on 03/06/2023   Cycle dispensing form sent to [name] for review.   Last adherence delivery date: 02/18/23      Patient is due for next adherence delivery on: 03/19/23  This delivery to include: Vials  30 Days -Pt agreed to switch to vials until her meds get straight Montelukast 10mg  1 B Ozempic Inject 0.5mg  once a week on Thursdays Valsartan 160mg -1 B Vascepa 1gm 2 B 2 EM Ezetimibe 10mg  1 B Lansoprazole 30mg  1 BB Fenofibrate 160mg  1 B Rosuvastatin 20mg  1 BT Torsemide 40mg  1 BT Clopidogrel 75mg  1 B Lamotrigine 25mg  2 BT Gabapentin 300mg  1 B 1 BT       Xanax 0.5mg - 1 three times daily (Bottles)  Myrbetriq 50mg  1-B Guaifenesin 600mg  Take 1 BID  Patient declined the following medications this month: Albuterol Inhaler-Pt has enough supply Valtrex 1000mg -Prn, does not need Albuterol Nebulizer 0.083% -Prn, does not need Docusate Sodium 100mg -Not taking currently Trelegy Ellipta 200-62.5-25mcg-Pt picks up at [facility] for $1.55 and wants to keep it there Flonase Nasal Spray- Plenty of supply only uses as needed  Percocet 10-325mg - No longer taking Test Strips/Lancets- Gets from [facility]. Cannot be billed through her insurance   Fetzima-D/C Side Effects Baclofen 10mg -Picked up at [facility] 01/25/23 90ds Nitrogylcerin 0.4mg -Prn, does not need  Refills requested from providers include: Ezetimibe 10mg  Fenofibrate 160mg  Rosuvastatin 20mg   Clopidogrel 75mg   Confirmed delivery date of 03/19/23, advised patient that pharmacy will contact them the morning of delivery.   Any concerns about your medications? No  How often do you forget or accidentally miss a dose? Never  Do you use a pillbox? No  Is patient in packaging Yes  If yes  What is the date on your next pill  pack? 03/07/23  Any concerns or issues with your packaging? None   Recent blood pressure readings are as follows: 03/06/23 165/84 68, 03/02/23 151/82 73, 134/70 81, 03/03/23 162/78 73, 127/70 78  Recent blood glucose readings are as follows: 03/06/23 114, 03/05/23 133, 03/04/23 128, 03/03/23 123, 03/02/23 146, 03/01/23 148   Chart review: Recent office visits:  03/01/23 Blane Ohara MD. Seen for follow up. Increase valsartan to 160 mg daily. Increase torsemide to 40 mg daily. Start guaifenesin 600 mg twice daily   Recent consult visits:  02/28/23 [name] DPM. Seen for follow up. No med changes.   Hospital visits:  None  Medications: Outpatient Encounter Medications as of 03/06/2023  Medication Sig   albuterol (PROVENTIL) (2.5 MG/3ML) 0.083% nebulizer solution Take 3 mLs (2.5 mg total) by nebulization every 6 (six) hours as needed for wheezing or shortness of breath.   albuterol (VENTOLIN HFA) 108 (90 Base) MCG/ACT inhaler Inhale 2 puffs into the lungs every 4 (four) hours as needed for wheezing or shortness of breath.   ALPRAZolam (XANAX) 0.5 MG tablet Take 1 tablet (0.5 mg total) by mouth 3 (three) times daily as needed for anxiety.   Aspirin-Acetaminophen (GOODYS BODY PAIN PO) Take 1 packet by mouth daily.   baclofen (LIORESAL) 10 MG tablet Take 10 mg by mouth 3 (three) times daily as needed.   blood glucose meter kit and supplies KIT Dispense based on patient and insurance preference. Use up to four times daily as directed.   clopidogrel (PLAVIX) 75 MG tablet TAKE ONE TABLET BY MOUTH EVERY MORNING  docusate sodium (COLACE) 100 MG capsule Take 100 mg by mouth 2 (two) times daily as needed for mild constipation.   ezetimibe (ZETIA) 10 MG tablet TAKE ONE TABLET BY MOUTH ONCE DAILY   fenofibrate 160 MG tablet TAKE ONE TABLET BY MOUTH EVERY MORNING   fluticasone (FLONASE) 50 MCG/ACT nasal spray INSTILL 1 SPRAY IN EACH NOSTRIL ONCE DAILY (Patient taking differently: Place 1 spray into both  nostrils daily as needed for rhinitis.)   gabapentin (NEURONTIN) 300 MG capsule Take 1 capsule (300 mg total) by mouth 2 (two) times daily. TAKE ONE CAPSULE BY MOUTH EVERYDAY AT BEDTIME   glucose blood (ONETOUCH ULTRA) test strip DISPENSE BASED ON PATIENT AND INSURANCE PREFERENCE. USE UP TO FOUR TIMES DAILY AS DIRECTED.   guaiFENesin (MUCINEX) 600 MG 12 hr tablet Take 1 tablet (600 mg total) by mouth 2 (two) times daily.   lamoTRIgine (LAMICTAL) 25 MG tablet TAKE TWO TABLETS BY MOUTH EVERYDAY AT BEDTIME   lansoprazole (PREVACID) 30 MG capsule TAKE ONE CAPSULE BY MOUTH BEFORE BREAKFAST   Levomilnacipran HCl ER (FETZIMA) 40 MG CP24 Take 40 mg by mouth daily.   mirabegron ER (MYRBETRIQ) 50 MG TB24 tablet Take 1 tablet (50 mg total) by mouth daily.   montelukast (SINGULAIR) 10 MG tablet Take 10 mg by mouth daily.   naloxone (NARCAN) nasal spray 4 mg/0.1 mL SMARTSIG:1 Both Nares Daily   nitroGLYCERIN (NITROSTAT) 0.4 MG SL tablet Place 1 tablet (0.4 mg total) under the tongue every 5 (five) minutes as needed.   oxyCODONE-acetaminophen (PERCOCET) 10-325 MG tablet Take 1 tablet by mouth every 6 (six) hours as needed for pain.   rosuvastatin (CRESTOR) 20 MG tablet TAKE ONE TABLET BY MOUTH EVERYDAY AT BEDTIME   Semaglutide, 1 MG/DOSE, (OZEMPIC, 1 MG/DOSE,) 2 MG/1.5ML SOPN Inject 1 mg into the skin once a week.   Torsemide 40 MG TABS Take 1 tablet by mouth daily.   TRELEGY ELLIPTA 200-62.5-25 MCG/ACT AEPB Inhale 1 puff into the lungs daily.   TRUEplus Lancets 30G MISC 1 each by Does not apply route 2 (two) times daily. E11.69   valACYclovir (VALTREX) 1000 MG tablet TAKE TWO TABLETS BY MOUTH twice A DAY as needed   valsartan (DIOVAN) 160 MG tablet Take 1 tablet (160 mg total) by mouth daily.   VASCEPA 1 g capsule TAKE TWO CAPSULES BY MOUTH TWICE DAILY   No facility-administered encounter medications on file as of 03/06/2023.   BP Readings from Last 3 Encounters:  03/01/23 (!) 152/68  02/12/23 122/68   01/22/23 136/64    Pulse Readings from Last 3 Encounters:  03/01/23 68  02/12/23 85  01/22/23 100    Lab Results  Component Value Date/Time   HGBA1C 7.7 (H) 01/23/2023 10:06 AM   HGBA1C 7.9 (H) 09/12/2022 09:17 AM   Lab Results  Component Value Date   CREATININE 1.17 (H) 02/12/2023   BUN 24 02/12/2023   GFRNONAA >60 10/19/2020   GFRAA 97 10/03/2020   NA 146 (H) 02/12/2023   K 4.9 02/12/2023   CALCIUM 9.1 02/12/2023   CO2 26 02/12/2023     [name], CMA Clinical Pharmacist Assistant  [phone]

## 2023-03-06 NOTE — Assessment & Plan Note (Signed)
Take mucinex 600 mg twice daily.

## 2023-03-07 ENCOUNTER — Other Ambulatory Visit: Payer: Self-pay

## 2023-03-07 MED ORDER — FENOFIBRATE 160 MG PO TABS: ORAL_TABLET | ORAL | 1 refills | Status: DC

## 2023-03-07 MED ORDER — EZETIMIBE 10 MG PO TABS: 10.0000 mg | ORAL_TABLET | Freq: Every day | ORAL | 1 refills | Status: DC

## 2023-03-07 MED ORDER — ROSUVASTATIN CALCIUM 20 MG PO TABS: 20.0000 mg | ORAL_TABLET | Freq: Every day | ORAL | 1 refills | Status: DC

## 2023-03-07 MED ORDER — CLOPIDOGREL BISULFATE 75 MG PO TABS: 75.0000 mg | ORAL_TABLET | Freq: Every morning | ORAL | 1 refills | Status: DC

## 2023-03-08 ENCOUNTER — Ambulatory Visit: Payer: Medicare Other | Attending: Nurse Practitioner | Admitting: Nurse Practitioner

## 2023-03-08 ENCOUNTER — Encounter: Payer: Self-pay | Admitting: Nurse Practitioner

## 2023-03-08 VITALS — BP 140/64 | HR 77 | Ht 65.0 in | Wt 164.6 lb

## 2023-03-08 DIAGNOSIS — E785 Hyperlipidemia, unspecified: Secondary | ICD-10-CM | POA: Diagnosis present

## 2023-03-08 DIAGNOSIS — G4733 Obstructive sleep apnea (adult) (pediatric): Secondary | ICD-10-CM | POA: Diagnosis present

## 2023-03-08 DIAGNOSIS — I10 Essential (primary) hypertension: Secondary | ICD-10-CM

## 2023-03-08 DIAGNOSIS — I739 Peripheral vascular disease, unspecified: Secondary | ICD-10-CM

## 2023-03-08 DIAGNOSIS — I2510 Atherosclerotic heart disease of native coronary artery without angina pectoris: Secondary | ICD-10-CM | POA: Insufficient documentation

## 2023-03-08 DIAGNOSIS — Z8673 Personal history of transient ischemic attack (TIA), and cerebral infarction without residual deficits: Secondary | ICD-10-CM | POA: Diagnosis present

## 2023-03-08 DIAGNOSIS — I5032 Chronic diastolic (congestive) heart failure: Secondary | ICD-10-CM | POA: Insufficient documentation

## 2023-03-08 NOTE — Progress Notes (Signed)
Office Visit    Patient Name: Sonya Small Date of Encounter: 03/08/2023  Primary Care Provider:  Blane Ohara, MD Primary Cardiologist:  [name] [name], MD  Chief Complaint    72 year old female with a history of CAD s/p DES-LCx in 2010, chronic diastolic heart failure, hypertension, hyperlipidemia, carotid artery disease, PAD, OSA, and tobacco use who presents for follow-up related to CAD and heart failure.  Past Medical History    Past Medical History:  Diagnosis Date   Anxiety    CAD (coronary artery disease)    a. NSTEMI 11/2008 s/p DES to LCx (3.0x12 Xience); b. NSTEMI 01/2010 secondary to thrombotic RCA lesion (non-obstructive)-->med rx (integrilin x 24 hrs + plavix); c. 09/2012 negative Myoview.   Chronic diastolic CHF (congestive heart failure) (HCC)    a. 06/2014 Echo: EF 55-60%, no rwma, Gr1 DD, mild AI.   Depression    Dizziness    Drug induced constipation    Ganglion cyst of left foot 01/17/2021   Generalized hyperhidrosis    GERD (gastroesophageal reflux disease)    Headache    Hyperlipidemia    Hypertensive heart disease    Lumbar disc disease    Metabolic encephalopathy    Mixed hyperlipidemia    Myocardial infarction (HCC)    Obstructive sleep apnea    OP (osteoporosis)    Osteoarthritis    Osteoporosis    Other malaise    Overweight(278.02)    PAD (peripheral artery disease) (HCC)    a. Emboli to R foot 2010 from partially occlusive lesion in R EIA, s/p stenting. - followed by Dr. Arbie Cookey;  b. 10/2015 ABIs: R 1.03, L 0.97.   Restless leg    Sleep apnea    Stroke [facility])    TIA (transient ischemic attack)    Tobacco abuse    Urge incontinence    Past Surgical History:  Procedure Laterality Date   CYST EXCISION Left 01/2021   left foot   EYE SURGERY     at age 59   hysterectomy -age 39     ILIAC ARTERY STENT     RIGHT ILIAC STENT   KNEE ARTHROSCOPY     LEFT HEART CATH AND CORONARY ANGIOGRAPHY N/A 03/05/2022   Procedure: LEFT HEART CATH AND CORONARY  ANGIOGRAPHY;  Surgeon: Corky Crafts, MD;  Location: [facility] IN[facility];  Service: Cardiovascular;  Laterality: N/A;   LITHOTRIPSY Left 12/2020   LUMBAR LAMINECTOMY     TUBAL LIGATION      Allergies  Allergies  Allergen Reactions   Abilify [Aripiprazole]     confusion   Latex Rash     Labs/Other Studies Reviewed    The following studies were reviewed today:  Cardiac Studies & Procedures   CARDIAC CATHETERIZATION  CARDIAC CATHETERIZATION 03/05/2022  Narrative   1st Mrg lesion is 25% stenosed.   Previously placed Mid Cx stent of unknown type is  widely patent.   The left ventricular systolic function is normal.   LV end diastolic pressure is mildly elevated.   The left ventricular ejection fraction is 45-50% by visual estimate.  Apical hypokinesis consistent with Takotsubo cardiomyopathy.   There is no aortic valve stenosis.  No significant coronary artery disease.  She does have apical hypokinesis which is consistent with a Takotsubo cardiomyopathy pattern.  She has been under a lot of stress of late.  The apical hypokinesis is minimal so I suspect her Takotsubo is in the process of resolving.  Continue aggressive blood pressure control.  Consider echocardiogram in 6 weeks.  LVEDP was mildly elevated.  Could consider short-term diuretic as well if she has significant shortness of breath.  Findings Coronary Findings Diagnostic  Dominance: Right  Left Anterior Descending The vessel exhibits minimal luminal irregularities.  Left Circumflex The vessel exhibits minimal luminal irregularities. Previously placed Mid Cx stent of unknown type is  widely patent.  First Obtuse Marginal Branch 1st Mrg lesion is 25% stenosed.  Right Coronary Artery There is mild diffuse disease throughout the vessel.  Intervention  No interventions have been documented.   STRESS TESTS  MYOCARDIAL PERFUSION IMAGING 08/22/2021  Narrative   Findings are consistent with no prior  ischemia. The study is low risk.   No ST deviation was noted.   LV perfusion is abnormal. Defect 1: There is a small defect with mild reduction in uptake present in the apical apex location(s) that is fixed. There is normal wall motion in the defect area. Consistent with artifact caused by breast attenuation.   Left ventricular function is normal. End diastolic cavity size is normal. End systolic cavity size is normal.   Prior study available for comparison from 03/25/2018. No changes compared to prior study. LVEF 52%, fixed apical attenuation artifact  Small size, mild intensity fixed apical perfusion defect, likely attenuation artifact. No reversible ischemia. LVEF 52% with normal wall motion. This is a low risk study. Compared to a prior study in 2019, there are no changes (the apical perfusion defect was previously reported) - LVEF is unchanged.             Recent Labs: 03/16/2022: Magnesium 2.3 12/28/2022: BNP 12.1 01/23/2023: TSH 0.630 02/12/2023: ALT 22; BUN 24; Creatinine, Ser 1.17; Hemoglobin 13.3; NT-Pro BNP 125; Platelets 304; Potassium 4.9; Sodium 146  Recent Lipid Panel    Component Value Date/Time   CHOL 99 (L) 01/23/2023 1006   TRIG 94 01/23/2023 1006   HDL 42 01/23/2023 1006   CHOLHDL 2.4 01/23/2023 1006   CHOLHDL 5.0 07/13/2014 0108   VLDL 42 (H) 07/13/2014 0108   LDLCALC 39 01/23/2023 1006    History of Present Illness    72 year old female with the above past medical history including CAD s/p DES-left circumflex, chronic diastolic heart failure, PAD, CVA, OSA, hypertension, and hyperlipidemia.   She was hospitalized in March 2010 the setting of NSTEMI s/p DES-LCx.  She had another NSTEMI in May 2011 secondary to thrombotic RCA lesion, cardiac catheterization showed otherwise nonobstructive disease, medical therapy was recommended.  She had recurrent chest pain in January 2014, Myoview was negative.  She was hospitalized in October 2015 in the setting of acute CVA.  She was  started on aspirin and Plavix.  ABIs in 2018 were normal.  Repeat Myoview in July 2019 showed EF 55%, small moderate intensity fixed apical perfusion defect, likely attenuation artifact, no other reversible ischemia, overall low risk.  At her follow-up visit in 04/2018 she reported dizziness.  This was thought to be in the setting of polypharmacy.  Carotid Dopplers were checked and were unremarkable.  She was hospitalized in May 2020 with pneumonia and respiratory failure requiring ventilator support.  He did smoke at the time.  Repeat Myoview in 07/2021 in the setting of pain across her shoulder blades and upper back was low risk, unchanged from prior.  At her follow-up visit in 02/2022 she noted ongoing intermittent chest pain, difficulty staying awake, numbness in her fingers and toes as well as blue discoloration of her toes.  Cardiac catheterization 06/29/2022 showed  no significant coronary artery disease, apical hypokinesis consistent with Takotsubo cardiomyopathy pattern.  Lower extremity arterial duplex, ABIs were normal. Lisinopril-HCTZ was discontinued by her PCP.  Additionally, she had been started on torsemide for lower extremity edema. She was last seen in the office on 01/11/2023 and was stable overall from a cardiac standpoint.  BP was stable.  She was encouraged to elevate her feet and wear compression hose.  She contacted our office on 02/28/2023 with concern for increased bilateral lower extremity edema.  She saw her PCP on 03/01/2023.  BP was elevated.  Valsartan was increased to 160 mg daily.  Torsemide was increased to 40 mg daily.  She presents today for follow-up accompanied by her sister.  Since her last visit She has been stable overall from a cardiac standpoint.  She has stable intermittent chest discomfort, unchanged from prior visits, stable mild dyspnea on exertion.  She continues to have nonpitting bilateral lower extremity edema despite increased torsemide dosing.  She has not yet increased  her valsartan to 160 mg daily as she is waiting on her pharmacy to deliver an updated pill pack.  BP remains somewhat elevated.  She notes she had a recent ABIs which were reportedly stable, records unavailable for review.  She is concerned and frustrated that she continues to have lower extremity edema.  Home Medications    Current Outpatient Medications  Medication Sig Dispense Refill   albuterol (PROVENTIL) (2.5 MG/3ML) 0.083% nebulizer solution Take 3 mLs (2.5 mg total) by nebulization every 6 (six) hours as needed for wheezing or shortness of breath. 75 mL 2   albuterol (VENTOLIN HFA) 108 (90 Base) MCG/ACT inhaler Inhale 2 puffs into the lungs every 4 (four) hours as needed for wheezing or shortness of breath.     ALPRAZolam (XANAX) 0.5 MG tablet Take 1 tablet (0.5 mg total) by mouth 3 (three) times daily as needed for anxiety. 90 tablet 1   Aspirin-Acetaminophen (GOODYS BODY PAIN PO) Take 1 packet by mouth daily.     baclofen (LIORESAL) 10 MG tablet Take 10 mg by mouth 3 (three) times daily as needed.     blood glucose meter kit and supplies KIT Dispense based on patient and insurance preference. Use up to four times daily as directed. 1 each 0   clopidogrel (PLAVIX) 75 MG tablet Take 1 tablet (75 mg total) by mouth every morning. 90 tablet 1   docusate sodium (COLACE) 100 MG capsule Take 100 mg by mouth 2 (two) times daily as needed for mild constipation.     ezetimibe (ZETIA) 10 MG tablet Take 1 tablet (10 mg total) by mouth daily. 90 tablet 1   fenofibrate 160 MG tablet TAKE ONE TABLET BY MOUTH EVERY MORNING 90 tablet 1   fluticasone (FLONASE) 50 MCG/ACT nasal spray INSTILL 1 SPRAY IN EACH NOSTRIL ONCE DAILY (Patient taking differently: Place 1 spray into both nostrils daily as needed for rhinitis.) 16 g 1   gabapentin (NEURONTIN) 300 MG capsule Take 1 capsule (300 mg total) by mouth 2 (two) times daily. TAKE ONE CAPSULE BY MOUTH EVERYDAY AT BEDTIME 180 capsule 1   glucose blood (ONETOUCH  ULTRA) test strip DISPENSE BASED ON PATIENT AND INSURANCE PREFERENCE. USE UP TO FOUR TIMES DAILY AS DIRECTED. 400 strip 3   guaiFENesin (MUCINEX) 600 MG 12 hr tablet Take 1 tablet (600 mg total) by mouth 2 (two) times daily. 60 tablet 5   lamoTRIgine (LAMICTAL) 25 MG tablet TAKE TWO TABLETS BY MOUTH EVERYDAY AT BEDTIME  180 tablet 0   lansoprazole (PREVACID) 30 MG capsule TAKE ONE CAPSULE BY MOUTH BEFORE BREAKFAST 90 capsule 3   Levomilnacipran HCl ER (FETZIMA) 40 MG CP24 Take 40 mg by mouth daily. 90 capsule 0   mirabegron ER (MYRBETRIQ) 50 MG TB24 tablet Take 1 tablet (50 mg total) by mouth daily. 30 tablet 2   montelukast (SINGULAIR) 10 MG tablet Take 10 mg by mouth daily.     naloxone (NARCAN) nasal spray 4 mg/0.1 mL SMARTSIG:1 Both Nares Daily     nitroGLYCERIN (NITROSTAT) 0.4 MG SL tablet Place 1 tablet (0.4 mg total) under the tongue every 5 (five) minutes as needed. 25 tablet 1   oxyCODONE-acetaminophen (PERCOCET) 10-325 MG tablet Take 1 tablet by mouth every 6 (six) hours as needed for pain.     rosuvastatin (CRESTOR) 20 MG tablet Take 1 tablet (20 mg total) by mouth daily. 90 tablet 1   Semaglutide, 1 MG/DOSE, (OZEMPIC, 1 MG/DOSE,) 2 MG/1.5ML SOPN Inject 1 mg into the skin once a week. 1.5 mL 0   Torsemide 40 MG TABS Take 1 tablet by mouth daily. 30 tablet 2   TRELEGY ELLIPTA 200-62.5-25 MCG/ACT AEPB Inhale 1 puff into the lungs daily.     TRUEplus Lancets 30G MISC 1 each by Does not apply route 2 (two) times daily. E11.69 400 each 3   valACYclovir (VALTREX) 1000 MG tablet TAKE TWO TABLETS BY MOUTH twice A DAY as needed 20 tablet 2   valsartan (DIOVAN) 160 MG tablet Take 1 tablet (160 mg total) by mouth daily. 90 tablet 3   VASCEPA 1 g capsule TAKE TWO CAPSULES BY MOUTH TWICE DAILY 360 capsule 1   No current facility-administered medications for this visit.     Review of Systems    She denies palpitations, pnd, orthopnea, n, v, dizziness, syncope, weight gain, or early satiety. All  other systems reviewed and are otherwise negative except as noted above.   Physical Exam    VS:  BP (!) 140/64 (BP Location: Right Arm, Patient Position: Sitting, Cuff Size: Normal)   Pulse 77   Ht 5\' 5"  (1.651 m)   Wt 164 lb 9.6 oz (74.7 kg)   SpO2 94%   BMI 27.39 kg/m   GEN: Well nourished, well developed, in no acute distress. HEENT: normal. Neck: Supple, no JVD, carotid bruits, or masses. Cardiac: RRR, no murmurs, rubs, or gallops. No clubbing, cyanosis, edema.  Radials/DP/PT 2+ and equal bilaterally.  Respiratory:  Respirations regular and unlabored, clear to auscultation bilaterally. GI: Soft, nontender, nondistended, BS + x 4. MS: no deformity or atrophy. Skin: warm and dry, no rash. Neuro:  Strength and sensation are intact. Psych: Normal affect.  Accessory Clinical Findings    ECG personally reviewed by me today - No EKG in office today.     Lab Results  Component Value Date   WBC 10.2 02/12/2023   HGB 13.3 02/12/2023   HCT 42.1 02/12/2023   MCV 88 02/12/2023   PLT 304 02/12/2023   Lab Results  Component Value Date   CREATININE 1.17 (H) 02/12/2023   BUN 24 02/12/2023   NA 146 (H) 02/12/2023   K 4.9 02/12/2023   CL 105 02/12/2023   CO2 26 02/12/2023   Lab Results  Component Value Date   ALT 22 02/12/2023   AST 26 02/12/2023   ALKPHOS 106 02/12/2023   BILITOT <0.2 02/12/2023   Lab Results  Component Value Date   CHOL 99 (L) 01/23/2023   HDL  42 01/23/2023   LDLCALC 39 01/23/2023   TRIG 94 01/23/2023   CHOLHDL 2.4 01/23/2023    Lab Results  Component Value Date   HGBA1C 7.7 (H) 01/23/2023    Assessment & Plan   1. CAD: Cardiac catheterization 06/29/2022 showed no significant coronary artery disease, apical hypokinesis consistent with Takotsubo cardiomyopathy pattern. She has stable intermittent chest discomfort, unchanged from prior visits, stable mild dyspnea on exertion.Given recent reassuring ischemic evaluation, will defer further ischemic  evaluation at this time.  Continue to monitor symptoms.  Continue Plavix, valsartan, Crestor, Vascepa, Zetia, fenofibrate.   2. Chronic diastolic heart failure: She notes persistent nonpitting bilateral lower extremity edema despite increased torsemide dosing.  Otherwise, euvolemic and well compensated on exam. Will check BNP, CMET today.  Will repeat echo. If BNP is elevated, could consider addition of chlorthalidone versus spironolactone.  If echo shows reduced EF, consider escalation of GDMT as renal function and BP allow.  Discussed ongoing monitoring with daily weights, sodium and fluid recommendations.  Continue valsartan, torsemide.   3. Hypertension/hypotension: She has a history of recent hypotension, BP has been recently elevated.  Valsartan was increased.  Continue to monitor BP and report BP consistently greater than 140/80.  For now, continue current antihypertensive regimen.    4. PAD: S/p right external iliac artery stenting. Lower extremity arterial duplex in 02/2022 showed greater than 50% stenosis of the left common iliac artery.  ABIs in 02/2022 were unremarkable.  She notes occasional numbness in her feet, stable overall.  Continue Plavix, Crestor, Vascepa, Zetia.   5. Hyperlipidemia: LDL was 39 in 01/2023.  Continue Crestor, Vascepa, Zetia.   6. OSA: Adherent to CPAP.     7. History of CVA: Recent MRI per PCP showed chronic cerebellar stroke.  She does have issues with balance.  Otherwise, stable.  Continue Plavix, Crestor, Vascepa, Zetia.   8. Disposition: Follow-up in 2 months with APP, follow-up in 4 months with Dr. [name] as scheduled.   Joylene Grapes, NP 03/08/2023, 6:17 PM

## 2023-03-08 NOTE — Patient Instructions (Addendum)
Medication Instructions:  No medication changes  *If you need a refill on your cardiac medications before your next appointment, please call your pharmacy*   Lab Work: Labs drawn today  If you have labs (blood work) drawn today and your tests are completely normal, you will receive your results only by: MyChart Message (if you have MyChart) OR A paper copy in the mail If you have any lab test that is abnormal or we need to change your treatment, we will call you to review the results.   Testing/Procedures: Your physician has requested that you have an echocardiogram. Echocardiography is a painless test that uses sound waves to create images of your heart. It provides your doctor with information about the size and shape of your heart and how well your heart's chambers and valves are working. This procedure takes approximately one hour. There are no restrictions for this procedure.  Please do NOT wear cologne, aftershave, or lotions (deodorant is allowed). Please arrive 15 minutes prior to your appointment time.    Follow-Up: At [facility], you and your health needs are our priority.  As part of our continuing mission to provide you with exceptional heart care, we have created designated Provider Care Teams.  These Care Teams include your primary Cardiologist (physician) and Advanced Practice Providers (APPs -  Physician Assistants and Nurse Practitioners) who all work together to provide you with the care you need, when you need it.  We recommend signing up for the patient portal called "MyChart".  Sign up information is provided on this After Visit Summary.  MyChart is used to connect with patients for Virtual Visits (Telemedicine).  Patients are able to view lab/test results, encounter notes, upcoming appointments, etc.  Non-urgent messages can be sent to your provider as well.   To learn more about what you can do with MyChart, go to [email or web address].    Your next  appointment:   2 month(s)  Provider:   Bernadene Person, NP        Other Instructions Keep your scheduled appointment with Dr. [name]

## 2023-03-09 LAB — COMPREHENSIVE METABOLIC PANEL
ALT: 21 IU/L (ref 0–32)
AST: 25 IU/L (ref 0–40)
Albumin: 4.2 g/dL (ref 3.8–4.8)
Alkaline Phosphatase: 109 IU/L (ref 44–121)
BUN/Creatinine Ratio: 27 (ref 12–28)
BUN: 22 mg/dL (ref 8–27)
Bilirubin Total: <0.2 mg/dL (ref 0.0–1.2)
CO2: 27 mmol/L (ref 20–29)
Calcium: 9.5 mg/dL (ref 8.7–10.3)
Chloride: 104 mmol/L (ref 96–106)
Creatinine, Ser: 0.82 mg/dL (ref 0.57–1.00)
Globulin, Total: 2.6 g/dL (ref 1.5–4.5)
Glucose: 127 mg/dL — ABNORMAL HIGH (ref 70–99)
Potassium: 4.9 mmol/L (ref 3.5–5.2)
Sodium: 145 mmol/L — ABNORMAL HIGH (ref 134–144)
Total Protein: 6.8 g/dL (ref 6.0–8.5)
eGFR: 75 mL/min/{1.73_m2} (ref >59)

## 2023-03-09 LAB — BRAIN NATRIURETIC PEPTIDE: BNP: 25.5 pg/mL (ref 0.0–100.0)

## 2023-03-11 ENCOUNTER — Other Ambulatory Visit: Payer: Self-pay | Admitting: Physician Assistant

## 2023-03-11 ENCOUNTER — Other Ambulatory Visit: Payer: Self-pay | Admitting: Family Medicine

## 2023-03-11 DIAGNOSIS — R5383 Other fatigue: Secondary | ICD-10-CM

## 2023-03-13 ENCOUNTER — Telehealth: Payer: Self-pay

## 2023-03-13 NOTE — Telephone Encounter (Signed)
Left a detailed message with lab results for pt. Pt was advised to call back if she has any questions or concerns.

## 2023-03-14 ENCOUNTER — Telehealth: Payer: Self-pay | Admitting: Cardiology

## 2023-03-14 NOTE — Telephone Encounter (Signed)
Follow Up:      Patient is returning a call from yesterday, concerning her results.Please try to call before 2:00 please, has another appointment.

## 2023-03-14 NOTE — Telephone Encounter (Signed)
Patient states she did not understand the message that was left on voicemail about her swelling. She states her feet and legs have swelling. I did advise to elevate legs and feet when ever possible while resting and try compression stockings. She is aware of lab results.

## 2023-03-14 NOTE — Telephone Encounter (Signed)
Called patient. LVM to call office.

## 2023-03-14 NOTE — Telephone Encounter (Signed)
Patient is returning LPN's call. She has to leave for a doctors appt so she is requesting a callback after 4:00 pm.   Please advise.

## 2023-03-19 ENCOUNTER — Telehealth: Payer: Self-pay

## 2023-03-19 ENCOUNTER — Other Ambulatory Visit: Payer: Self-pay | Admitting: Family Medicine

## 2023-03-19 MED ORDER — CLOTRIMAZOLE 10 MG MT TROC: 10.0000 mg | Freq: Every day | OROMUCOSAL | 1 refills | Status: DC

## 2023-03-19 NOTE — Telephone Encounter (Signed)
Sent mycelex troches to [facility]. Dr. Sedalia Muta

## 2023-03-19 NOTE — Telephone Encounter (Signed)
Patient has called stating that the other day after using her inhaler she did not wash her mouth out and totally forgot to do so. She states that now she has thrush and is requesting something be sent in for it. Can you Send in something to pharmacy for patient please advise?

## 2023-03-20 ENCOUNTER — Encounter: Payer: Self-pay | Admitting: Pharmacist

## 2023-03-20 NOTE — Progress Notes (Signed)
Patient previously followed by [facility] pharmacist. Per clinical review, no pharmacist appointment needed at this time. Care guide directed to contact patient and cancel appointment and notify pharmacy team of any patient concerns.  

## 2023-03-20 NOTE — Telephone Encounter (Signed)
Patient Made Aware, Verbalized Understanding. 

## 2023-03-22 ENCOUNTER — Telehealth: Payer: Self-pay

## 2023-03-22 NOTE — Telephone Encounter (Signed)
Transition Care Management Unsuccessful Follow-up Telephone Call  Date of discharge and from where:  03/17/2023 [facility]  Attempts:  1st Attempt  Reason for unsuccessful TCM follow-up call:  Left voice message  Lounell Schumacher [facility]  [facility] Population Health Community Resource Care Guide   [email or web address]  ?? [phone]   Website: [email or web address]  [email or web address]

## 2023-03-25 ENCOUNTER — Other Ambulatory Visit: Payer: Self-pay | Admitting: Family Medicine

## 2023-03-25 ENCOUNTER — Telehealth: Payer: Self-pay

## 2023-03-25 NOTE — Telephone Encounter (Signed)
Patient called stating that she had a stye in her eye. Told the patient normal recommendations for a stye is to take a warm wet wash cloth and cover the eye on and off throughout the day until it comes to a head and then until it resolves on its own. Patient understood verbally.

## 2023-03-25 NOTE — Telephone Encounter (Signed)
Transition Care Management Unsuccessful Follow-up Telephone Call  Date of discharge and from where:  03/17/2023 [facility]  Attempts:  2nd Attempt  Reason for unsuccessful TCM follow-up call:  No answer/busy  Johnie Stadel Rivereno  [facility] Population Health Community Resource Care Guide   ??[email or web address]  ?? [phone]   Website: [email or web address]  [email or web address]      

## 2023-03-27 ENCOUNTER — Ambulatory Visit (INDEPENDENT_AMBULATORY_CARE_PROVIDER_SITE_OTHER): Payer: Medicare Other

## 2023-03-27 ENCOUNTER — Telehealth: Payer: Self-pay

## 2023-03-27 DIAGNOSIS — M810 Age-related osteoporosis without current pathological fracture: Secondary | ICD-10-CM | POA: Diagnosis not present

## 2023-03-27 MED ORDER — ROMOSOZUMAB-AQQG 105 MG/1.17ML ~~LOC~~ SOSY
210.0000 mg | PREFILLED_SYRINGE | Freq: Once | SUBCUTANEOUS | Status: AC
Start: 2023-03-27 — End: 2023-03-27
  Administered 2023-03-27: 210 mg via SUBCUTANEOUS

## 2023-03-27 NOTE — Progress Notes (Signed)
Patient came in for Evenity injection. Injections was given in right and left arm, Patient tolerated well. Patient stated that she got some 0.5 mg Ozempic at home that's not open and was recently sent in 1 mg that was delivered to her house yesterday.   Recommended patient finish the 0.5 mg pen that she has now and than start the 1 mg.

## 2023-03-27 NOTE — Telephone Encounter (Signed)
Patient also drop off some Novo-fine 32G pen needles that she is not longer needing and wanted to give to a patient that may need them and stated she will bring in some Ozempic that she has that she does not need as well.

## 2023-04-03 ENCOUNTER — Other Ambulatory Visit: Payer: Self-pay | Admitting: Family Medicine

## 2023-04-08 ENCOUNTER — Ambulatory Visit: Payer: Medicare Other | Admitting: Family Medicine

## 2023-04-09 ENCOUNTER — Ambulatory Visit: Payer: Medicare Other | Attending: Cardiology

## 2023-04-09 DIAGNOSIS — I5032 Chronic diastolic (congestive) heart failure: Secondary | ICD-10-CM | POA: Diagnosis present

## 2023-04-09 LAB — ECHOCARDIOGRAM COMPLETE
Area-P 1/2: 3.03 cm2
MV M vel: 4.52 m/s
MV Peak grad: 81.7 mmHg
P 1/2 time: 590 msec
S' Lateral: 3.4 cm

## 2023-04-12 ENCOUNTER — Telehealth: Payer: Self-pay

## 2023-04-12 NOTE — Telephone Encounter (Signed)
Spoke with pt. Pt was notified of echo results. Pt will continue current medication and f/u as planned.  

## 2023-04-15 ENCOUNTER — Other Ambulatory Visit: Payer: Self-pay | Admitting: Family Medicine

## 2023-04-15 DIAGNOSIS — N3946 Mixed incontinence: Secondary | ICD-10-CM

## 2023-04-22 ENCOUNTER — Telehealth: Payer: Self-pay | Admitting: Nurse Practitioner

## 2023-04-22 ENCOUNTER — Other Ambulatory Visit: Payer: Self-pay

## 2023-04-22 DIAGNOSIS — I110 Hypertensive heart disease with heart failure: Secondary | ICD-10-CM

## 2023-04-22 DIAGNOSIS — E782 Mixed hyperlipidemia: Secondary | ICD-10-CM

## 2023-04-22 DIAGNOSIS — E1140 Type 2 diabetes mellitus with diabetic neuropathy, unspecified: Secondary | ICD-10-CM

## 2023-04-22 DIAGNOSIS — M810 Age-related osteoporosis without current pathological fracture: Secondary | ICD-10-CM

## 2023-04-22 NOTE — Telephone Encounter (Signed)
Patient stated she received call about having an appointment in [place]. Did not see that we referred her any other place. She verbalized understanding.  She also wanted to cancel her appointment  with emily on 8/13. She states that's too many appointments and that's too much getting up and down the highway. She has appointment with Dr. [name] on in October

## 2023-04-22 NOTE — Telephone Encounter (Signed)
Pt called in about a test she is supposed to have in [facility]. Informed her I do not see [name], NP has ordered any additional tests to be done until next year. I'm unsure what she is referring to, please advise.

## 2023-04-26 ENCOUNTER — Other Ambulatory Visit: Payer: Medicare Other

## 2023-04-26 ENCOUNTER — Other Ambulatory Visit: Payer: Self-pay | Admitting: Family Medicine

## 2023-04-26 DIAGNOSIS — R5383 Other fatigue: Secondary | ICD-10-CM

## 2023-04-26 DIAGNOSIS — E1140 Type 2 diabetes mellitus with diabetic neuropathy, unspecified: Secondary | ICD-10-CM

## 2023-04-26 DIAGNOSIS — I5042 Chronic combined systolic (congestive) and diastolic (congestive) heart failure: Secondary | ICD-10-CM

## 2023-04-26 DIAGNOSIS — M810 Age-related osteoporosis without current pathological fracture: Secondary | ICD-10-CM

## 2023-04-26 DIAGNOSIS — E782 Mixed hyperlipidemia: Secondary | ICD-10-CM

## 2023-04-26 DIAGNOSIS — R0609 Other forms of dyspnea: Secondary | ICD-10-CM

## 2023-04-29 ENCOUNTER — Encounter: Payer: Self-pay | Admitting: Family Medicine

## 2023-04-29 ENCOUNTER — Ambulatory Visit (INDEPENDENT_AMBULATORY_CARE_PROVIDER_SITE_OTHER): Payer: Medicare Other | Admitting: Family Medicine

## 2023-04-29 VITALS — BP 130/58 | HR 72 | Temp 95.0°F | Resp 16 | Ht 65.0 in | Wt 161.0 lb

## 2023-04-29 DIAGNOSIS — F17219 Nicotine dependence, cigarettes, with unspecified nicotine-induced disorders: Secondary | ICD-10-CM

## 2023-04-29 DIAGNOSIS — E559 Vitamin D deficiency, unspecified: Secondary | ICD-10-CM

## 2023-04-29 DIAGNOSIS — F332 Major depressive disorder, recurrent severe without psychotic features: Secondary | ICD-10-CM

## 2023-04-29 DIAGNOSIS — E782 Mixed hyperlipidemia: Secondary | ICD-10-CM

## 2023-04-29 DIAGNOSIS — E1140 Type 2 diabetes mellitus with diabetic neuropathy, unspecified: Secondary | ICD-10-CM

## 2023-04-29 DIAGNOSIS — I2510 Atherosclerotic heart disease of native coronary artery without angina pectoris: Secondary | ICD-10-CM | POA: Diagnosis not present

## 2023-04-29 DIAGNOSIS — J449 Chronic obstructive pulmonary disease, unspecified: Secondary | ICD-10-CM

## 2023-04-29 DIAGNOSIS — R600 Localized edema: Secondary | ICD-10-CM

## 2023-04-29 MED ORDER — BUPROPION HCL ER (XL) 150 MG PO TB24: 150.0000 mg | ORAL_TABLET | Freq: Every day | ORAL | 2 refills | Status: DC

## 2023-04-29 MED ORDER — SEMAGLUTIDE (2 MG/DOSE) 8 MG/3ML ~~LOC~~ SOPN
2.0000 mg | PEN_INJECTOR | SUBCUTANEOUS | 0 refills | Status: AC
Start: 2023-04-29 — End: ?

## 2023-04-29 MED ORDER — VITAMIN D (ERGOCALCIFEROL) 1.25 MG (50000 UNIT) PO CAPS
50000.0000 [IU] | ORAL_CAPSULE | ORAL | 0 refills | Status: DC
Start: 2023-04-29 — End: 2023-10-29

## 2023-04-29 NOTE — Patient Instructions (Addendum)
Ergocalciferol 50,000 weekly.    For diabetes: Increase ozempic to 2 mg weekly.   For swelling: Continue treatment with compression stockings  For depression: Start wellbutrin xl 150 mg daily. Continue fetzima.

## 2023-04-29 NOTE — Progress Notes (Unsigned)
Subjective:  Patient ID: Sonya Small, female    DOB: 21-Aug-1951  Age: 72 y.o. MRN: [id number]  Chief Complaint  Patient presents with   Medical Management of Chronic Issues    HPI Diabetes:  Complications: neuropathy Glucose checking: daily fasting Glucose logs: 117-174 Hypoglycemia: no Most recent A1C: 7.4 Current medications:ozempic 1 mg weekly, farxiga 10 mg neurontin 300 mg before bed.  Last Eye Exam: 09/04/2022. Foot checks: daily.  Breakfast 5:30 am, Supper 2 pm Does not eat anymore.    Hyperlipidemia: Current medications: on fenofibrate 160 mg daily, rosuvastatin 20 mg daily. Vascepa 1 gm 2 capsules twice daily, and zetia 10 mg daily.     Hypertension: Complications: CORONARY ARTERY DISEASE. On plavix 75 mg daily.  Current medications: zestoretic 20/25 mg daily, lasix 20 mg daily.  Home readings 129/63-172/78.   COPD: on Trelegy daily, Albuterol hfa or nebs.   Depression: on fetzima 40 mg once daily, Lamictal 25 mg 2 before bed, xanax 0.5 mg three times a day.      04/29/2023   11:39 AM 01/22/2023    8:47 AM 09/14/2022    9:02 AM 08/28/2022    1:56 PM 01/24/2022    9:16 AM  Depression screen PHQ 2/9  Decreased Interest 3 0 3 3 2   Down, Depressed, Hopeless 3 3 3 3 3   PHQ - 2 Score 6 3 6 6 5   Altered sleeping 3 3 0 3 2  Tired, decreased energy 3 3 3 3 3   Change in appetite 1 3 0 1 3  Feeling bad or failure about yourself  3 1 0 0 3  Trouble concentrating 3 3 1  0 2  Moving slowly or fidgety/restless 3 3 0 3 0  Suicidal thoughts  0 0 0 0  PHQ-9 Score 22 19 10 16 18   Difficult doing work/chores Somewhat difficult Very difficult Somewhat difficult Very difficult Somewhat difficult        04/29/2023   11:39 AM  Fall Risk   Falls in the past year? 1  Number falls in past yr: 1  Injury with Fall? 1  Risk for fall due to : History of fall(s)  Follow up Falls evaluation completed;Falls prevention discussed    Patient Care Team: Blane Ohara, MD as PCP -  General (Family Medicine) [name], [name], MD as PCP - Cardiology (Cardiology)   Review of Systems  Constitutional:  Negative for chills, fatigue and fever.  HENT:  Negative for congestion, rhinorrhea and sore throat.   Respiratory:  Positive for cough, shortness of breath and wheezing.   Cardiovascular:  Positive for palpitations and leg swelling. Negative for chest pain.  Gastrointestinal:  Positive for abdominal pain and constipation. Negative for diarrhea, nausea and vomiting.  Genitourinary:  Negative for dysuria and urgency.  Musculoskeletal:  Positive for back pain and myalgias.  Neurological:  Positive for dizziness and headaches. Negative for weakness and light-headedness.  Psychiatric/Behavioral:  Positive for dysphoric mood. The patient is not nervous/anxious.     Current Outpatient Medications on File Prior to Visit  Medication Sig Dispense Refill   albuterol (PROVENTIL) (2.5 MG/3ML) 0.083% nebulizer solution Take 3 mLs (2.5 mg total) by nebulization every 6 (six) hours as needed for wheezing or shortness of breath. 75 mL 2   albuterol (VENTOLIN HFA) 108 (90 Base) MCG/ACT inhaler Inhale 2 puffs into the lungs every 4 (four) hours as needed for wheezing or shortness of breath.     ALPRAZolam (XANAX) 0.5 MG tablet TAKE  ONE TABLET BY MOUTH three times daily 90 tablet 1   Aspirin-Acetaminophen (GOODYS BODY PAIN PO) Take 1 packet by mouth daily.     baclofen (LIORESAL) 10 MG tablet Take 10 mg by mouth 3 (three) times daily as needed.     blood glucose meter kit and supplies KIT Dispense based on patient and insurance preference. Use up to four times daily as directed. 1 each 0   clopidogrel (PLAVIX) 75 MG tablet Take 1 tablet (75 mg total) by mouth every morning. 90 tablet 1   clotrimazole (MYCELEX) 10 MG troche TAKE ONE TABLET BY MOUTH FIVE TIMES DAILY 25 Troche 1   docusate sodium (COLACE) 100 MG capsule Take 100 mg by mouth 2 (two) times daily as needed for mild constipation.      ezetimibe (ZETIA) 10 MG tablet Take 1 tablet (10 mg total) by mouth daily. 90 tablet 1   fenofibrate 160 MG tablet TAKE ONE TABLET BY MOUTH EVERY MORNING 90 tablet 1   fluticasone (FLONASE) 50 MCG/ACT nasal spray INSTILL 1 SPRAY IN EACH NOSTRIL ONCE DAILY (Patient taking differently: Place 1 spray into both nostrils daily as needed for rhinitis.) 16 g 1   gabapentin (NEURONTIN) 300 MG capsule Take 1 capsule (300 mg total) by mouth 2 (two) times daily. TAKE ONE CAPSULE BY MOUTH EVERYDAY AT BEDTIME 180 capsule 1   glucose blood (ONETOUCH ULTRA) test strip DISPENSE BASED ON PATIENT AND INSURANCE PREFERENCE. USE UP TO FOUR TIMES DAILY AS DIRECTED. 400 strip 3   guaiFENesin (MUCINEX) 600 MG 12 hr tablet Take 1 tablet (600 mg total) by mouth 2 (two) times daily. 60 tablet 5   lamoTRIgine (LAMICTAL) 25 MG tablet TAKE TWO TABLETS BY MOUTH EVERYDAY AT BEDTIME 180 tablet 0   lansoprazole (PREVACID) 30 MG capsule TAKE ONE CAPSULE BY MOUTH BEFORE BREAKFAST 90 capsule 3   Levomilnacipran HCl ER (FETZIMA) 40 MG CP24 Take 40 mg by mouth daily. 90 capsule 0   montelukast (SINGULAIR) 10 MG tablet Take 10 mg by mouth daily.     MYRBETRIQ 50 MG TB24 tablet TAKE ONE TABLET BY MOUTH ONCE DAILY 30 tablet 2   naloxone (NARCAN) nasal spray 4 mg/0.1 mL SMARTSIG:1 Both Nares Daily     nitroGLYCERIN (NITROSTAT) 0.4 MG SL tablet Dissolve 1 tab under tongue as needed for chest pain. May repeat every 5 minutes x 2 doses. If no relief call 9-1-1. 25 tablet 2   oxyCODONE-acetaminophen (PERCOCET) 10-325 MG tablet Take 1 tablet by mouth every 6 (six) hours as needed for pain.     rosuvastatin (CRESTOR) 20 MG tablet Take 1 tablet (20 mg total) by mouth daily. 90 tablet 1   Torsemide 40 MG TABS Take 1 tablet by mouth daily. 30 tablet 2   TRELEGY ELLIPTA 200-62.5-25 MCG/ACT AEPB Inhale 1 puff into the lungs daily.     TRUEplus Lancets 30G MISC 1 each by Does not apply route 2 (two) times daily. E11.69 400 each 3   valACYclovir  (VALTREX) 1000 MG tablet TAKE TWO TABLETS BY MOUTH twice A DAY as needed 20 tablet 2   valsartan (DIOVAN) 160 MG tablet Take 1 tablet (160 mg total) by mouth daily. 90 tablet 3   VASCEPA 1 g capsule TAKE TWO CAPSULES BY MOUTH TWICE DAILY 360 capsule 2   No current facility-administered medications on file prior to visit.   Past Medical History:  Diagnosis Date   Anxiety    CAD (coronary artery disease)    a. NSTEMI  11/2008 s/p DES to LCx (3.0x12 Xience); b. NSTEMI 01/2010 secondary to thrombotic RCA lesion (non-obstructive)-->med rx (integrilin x 24 hrs + plavix); c. 09/2012 negative Myoview.   Chronic diastolic CHF (congestive heart failure) (HCC)    a. 06/2014 Echo: EF 55-60%, no rwma, Gr1 DD, mild AI.   Depression    Dizziness    Drug induced constipation    Ganglion cyst of left foot 01/17/2021   Generalized hyperhidrosis    GERD (gastroesophageal reflux disease)    Headache    Hyperlipidemia    Hypertensive heart disease    Lumbar disc disease    Metabolic encephalopathy    Mixed hyperlipidemia    Myocardial infarction (HCC)    Obstructive sleep apnea    OP (osteoporosis)    Osteoarthritis    Osteoporosis    Other malaise    Overweight(278.02)    PAD (peripheral artery disease) (HCC)    a. Emboli to R foot 2010 from partially occlusive lesion in R EIA, s/p stenting. - followed by Dr. Arbie Cookey;  b. 10/2015 ABIs: R 1.03, L 0.97.   Restless leg    Sleep apnea    Stroke [facility])    TIA (transient ischemic attack)    Tobacco abuse    Urge incontinence    Past Surgical History:  Procedure Laterality Date   CYST EXCISION Left 01/2021   left foot   EYE SURGERY     at age 57   hysterectomy -age 71     ILIAC ARTERY STENT     RIGHT ILIAC STENT   KNEE ARTHROSCOPY     LEFT HEART CATH AND CORONARY ANGIOGRAPHY N/A 03/05/2022   Procedure: LEFT HEART CATH AND CORONARY ANGIOGRAPHY;  Surgeon: Corky Crafts, MD;  Location: [facility];  Service: Cardiovascular;  Laterality:  N/A;   LITHOTRIPSY Left 12/2020   LUMBAR LAMINECTOMY     TUBAL LIGATION      Family History  Problem Relation Age of Onset   Alzheimer's disease Mother    Hodgkin's lymphoma Brother    Lung cancer Brother    Hepatitis C Brother    Hypothyroidism Brother    Hypertension Brother    Depression Brother    Heart disease Other        Grandfather   Social History   Socioeconomic History   Marital status: Divorced    Spouse name: Not on file   Number of children: 3   Years of education: 10 th   Highest education level: Not on file  Occupational History   Occupation: DISABLED    Employer: UNEMPLOYED  Tobacco Use   Smoking status: Every Day    Current packs/day: 0.00    Average packs/day: 2.0 packs/day for 54.0 years (108.0 ttl pk-yrs)    Types: Cigarettes    Start date: 11/20/1958    Last attempt to quit: 11/20/2012    Years since quitting: 10.4   Smokeless tobacco: Never   Tobacco comments:    2 ppd for many years.     04/13/2022 Patient smokes 1/2 pack daily or more if she is nervous  Substance and Sexual Activity   Alcohol use: No    Alcohol/week: 0.0 standard drinks of alcohol   Drug use: No   Sexual activity: Never  Other Topics Concern   Not on file  Social History Narrative   Patient is single with 3 children, 1 deceased.   Patient is right handed.   Patient has 10 th grade education.  Patient drinks 5 or more cups daily.   Social Determinants of Health   Financial Resource Strain: Low Risk  (02/27/2023)   Overall Financial Resource Strain (CARDIA)    Difficulty of Paying Living Expenses: Not hard at all  Food Insecurity: No Food Insecurity (09/14/2022)   Hunger Vital Sign    Worried About Running Out of Food in the Last Year: Never true    Ran Out of Food in the Last Year: Never true  Transportation Needs: No Transportation Needs (02/27/2023)   PRAPARE - Administrator, Civil Service (Medical): No    Lack of Transportation (Non-Medical): No   Physical Activity: Inactive (09/14/2022)   Exercise Vital Sign    Days of Exercise per Week: 0 days    Minutes of Exercise per Session: 0 min  Stress: Stress Concern Present (09/14/2022)   [employer] of Occupational Health - Occupational Stress Questionnaire    Feeling of Stress : Rather much  Social Connections: Moderately Isolated (09/14/2022)   Social Connection and Isolation Panel [NHANES]    Frequency of Communication with Friends and Family: More than three times a week    Frequency of Social Gatherings with Friends and Family: Never    Attends Religious Services: More than 4 times per year    Active Member of [employer] or Organizations: No    Attends Engineer, structural: Never    Marital Status: Divorced    Objective:  BP (!) 130/58   Pulse 72   Temp (!) 95 F (35 C)   Resp 16   Ht 5\' 5"  (1.651 m)   Wt 161 lb (73 kg)   BMI 26.79 kg/m      04/29/2023   11:36 AM 03/08/2023    2:20 PM 03/01/2023    9:55 AM  BP/Weight  Systolic BP 130 140 152  Diastolic BP 58 64 68  Wt. (Lbs) 161 164.6 164  BMI 26.79 kg/m2 27.39 kg/m2 27.29 kg/m2    Physical Exam Vitals reviewed.  Constitutional:      Appearance: Normal appearance. She is normal weight.  Neck:     Vascular: No carotid bruit.  Cardiovascular:     Rate and Rhythm: Normal rate and regular rhythm.     Heart sounds: Normal heart sounds.  Pulmonary:     Effort: Pulmonary effort is normal. No respiratory distress.     Breath sounds: Normal breath sounds.  Abdominal:     General: Abdomen is flat. Bowel sounds are normal.     Palpations: Abdomen is soft.     Tenderness: There is no abdominal tenderness.  Neurological:     General: No focal deficit present.     Mental Status: She is alert and oriented to person, place, and time.     Cranial Nerves: No cranial nerve deficit.  Psychiatric:        Mood and Affect: Mood normal.        Behavior: Behavior normal.     Diabetic Foot Exam - Simple   Simple  Foot Form  04/29/2023 12:13 PM  Visual Inspection No deformities, no ulcerations, no other skin breakdown bilaterally: Yes Sensation Testing See comments: Yes Pulse Check Posterior Tibialis and Dorsalis pulse intact bilaterally: Yes Comments Decreased sensation bilaterally      Lab Results  Component Value Date   WBC 7.9 04/26/2023   HGB 13.6 04/26/2023   HCT 41.6 04/26/2023   PLT 249 04/26/2023   GLUCOSE 120 (H) 04/26/2023   CHOL 112  04/26/2023   TRIG 108 04/26/2023   HDL 47 04/26/2023   LDLCALC 45 04/26/2023   ALT 22 04/26/2023   AST 25 04/26/2023   NA 145 (H) 04/26/2023   K 4.5 04/26/2023   CL 105 04/26/2023   CREATININE 0.74 04/26/2023   BUN 19 04/26/2023   CO2 23 04/26/2023   TSH 0.734 04/26/2023   INR 1.0 01/31/2019   HGBA1C 7.4 (H) 04/26/2023   MICROALBUR neg 02/27/2021      Assessment & Plan:    There are no diagnoses linked to this encounter.   Meds ordered this encounter  Medications   Vitamin D, Ergocalciferol, (DRISDOL) 1.25 MG (50000 UNIT) CAPS capsule    Sig: Take 1 capsule (50,000 Units total) by mouth every 7 (seven) days.    Dispense:  14 capsule    Refill:  0   Semaglutide, 2 MG/DOSE, 8 MG/3ML SOPN    Sig: Inject 2 mg as directed once a week.    Dispense:  9 mL    Refill:  0    No orders of the defined types were placed in this encounter.    Follow-up: Return in about 6 weeks (around 06/10/2023) for chronic follow up in 6 weeks as well as a 46-month follow-up appointment.   I,Carolyn M Morrison,acting as a Neurosurgeon for Blane Ohara, MD.,have documented all relevant documentation on the behalf of Blane Ohara, MD,as directed by  Blane Ohara, MD while in the presence of Blane Ohara, MD.   An After Visit Summary was printed and given to the patient.  Blane Ohara, MD  Family Practice [phone]

## 2023-05-01 ENCOUNTER — Encounter: Payer: Self-pay | Admitting: Family Medicine

## 2023-05-01 DIAGNOSIS — E559 Vitamin D deficiency, unspecified: Secondary | ICD-10-CM | POA: Insufficient documentation

## 2023-05-01 NOTE — Assessment & Plan Note (Signed)
Well controlled.  No changes to medicines. Continue fenofibrate 160 mg daily, rosuvastatin 20 mg daily. Vascepa 1 gm 2 capsules twice daily, and zetia 10 mg daily.   Continue to work on eating a healthy diet and exercise.

## 2023-05-01 NOTE — Assessment & Plan Note (Signed)
Ergocalciferol 50,000 weekly.

## 2023-05-01 NOTE — Assessment & Plan Note (Signed)
Continue trying to cut down and quit.

## 2023-05-01 NOTE — Assessment & Plan Note (Signed)
Management per cardiology.  On Plavix, rosuvastatin, and Vascepa.

## 2023-05-01 NOTE — Assessment & Plan Note (Addendum)
For swelling: Continue treatment with compression stockings. Continue torsemide 40 mg daily.

## 2023-05-01 NOTE — Assessment & Plan Note (Signed)
Poorly controlled.  This has been poorly controlled despite adjusting medications.  Patient refuses to go to counseling.  Refuses to go to a psychiatrist.  Start wellbutrin xl 150 mg daily. Continue fetzima.  Contracts for safety.

## 2023-05-01 NOTE — Assessment & Plan Note (Addendum)
Control: improving Recommend check sugars fasting daily. Recommend check feet daily. Recommend annual eye exams. Medicines: increase ozempic to 2 mg weekly, farxiga 10 mg daily, neurontin 300 mg before bed.  Last Eye Exam: 09/04/2022. Continue to work on eating a healthy diet and exercise.  Labs reviewed.

## 2023-05-01 NOTE — Assessment & Plan Note (Signed)
Continue Trelegy 1 inhalation daily.  Continue albuterol as needed. Recommend strongly that the patient quit smoking.  She refuses.

## 2023-05-06 ENCOUNTER — Other Ambulatory Visit: Payer: Self-pay | Admitting: Family Medicine

## 2023-05-06 DIAGNOSIS — R5383 Other fatigue: Secondary | ICD-10-CM

## 2023-05-06 DIAGNOSIS — I110 Hypertensive heart disease with heart failure: Secondary | ICD-10-CM

## 2023-05-07 ENCOUNTER — Ambulatory Visit (INDEPENDENT_AMBULATORY_CARE_PROVIDER_SITE_OTHER): Payer: Medicare Other | Admitting: Neurology

## 2023-05-07 ENCOUNTER — Telehealth: Payer: Self-pay | Admitting: Family Medicine

## 2023-05-07 ENCOUNTER — Encounter: Payer: Self-pay | Admitting: Neurology

## 2023-05-07 ENCOUNTER — Ambulatory Visit: Payer: Medicare Other | Attending: Nurse Practitioner | Admitting: Nurse Practitioner

## 2023-05-07 VITALS — BP 132/68 | Ht 65.0 in | Wt 159.0 lb

## 2023-05-07 DIAGNOSIS — G4710 Hypersomnia, unspecified: Secondary | ICD-10-CM

## 2023-05-07 DIAGNOSIS — I679 Cerebrovascular disease, unspecified: Secondary | ICD-10-CM

## 2023-05-07 DIAGNOSIS — R269 Unspecified abnormalities of gait and mobility: Secondary | ICD-10-CM

## 2023-05-07 NOTE — Progress Notes (Unsigned)
Chief Complaint  Patient presents with   New Patient (Initial Visit)    Rm 14, with sister Talbert Forest, NP stroke, constant weakness and numbness on entire right side      ASSESSMENT AND PLAN  Sonya Small is a 72 y.o. female   Cerebrovascular disease Peripheral vascular disease,  MRI of the brain from [facility] imaging in January 2024 showed extensive periventricular small vessel disease, no acute abnormality,  She certainly has multiple vascular risk factor including hypertension, hyperlipidemia, diabetes, peripheral vascular disease, longtime smoker,  Is on Plavix,  Echocardiogram showed normal ejection fraction, mild left ventricular hypertrophy of septal segment,  Complete evaluation with ultrasound of carotid artery  Excessive sleepiness, vivid dreams,  Under the care of outside sleep specialist, difficulty compliant with her CPAP machine,  EEG to rule out partial seizure Unsteady gait:  Refer to PT  DIAGNOSTIC DATA (LABS, IMAGING, TESTING) - I reviewed patient records, labs, notes, testing and imaging myself where available.   MEDICAL HISTORY:  Jerrye Bushy, seen in request by   Retia Passe, NP [address] [place],  [place] [address], Blane Ohara, MD   I reviewed and summarized the referring note. PMHX. CAD HTN DM HLD COPD 1ppd Peripheral vascular disease. Lumbar decompression surgery.  She lieks to cook fall into sleep sitdding down in [place], at [employer], frequently, ''Not sleep well, excessive   Sleep physician  [facility]ower, plavix 75mg  daily,   Low back pain, neck shoudle rpain,   Past Medical History:  Diagnosis Date   Anxiety    CAD (coronary artery disease)    a. NSTEMI 11/2008 s/p DES to LCx (3.0x12 Xience); b. NSTEMI 01/2010 secondary to thrombotic RCA lesion (non-obstructive)-->med rx (integrilin x 24 hrs + plavix); c. 09/2012 negative Myoview.   Chronic diastolic CHF (congestive heart failure) (HCC)    a. 06/2014 Echo: EF 55-60%, no  rwma, Gr1 DD, mild AI.   Depression    Dizziness    Drug induced constipation    Ganglion cyst of left foot 01/17/2021   Generalized hyperhidrosis    GERD (gastroesophageal reflux disease)    Headache    Hyperlipidemia    Hypertensive heart disease    Lumbar disc disease    Metabolic encephalopathy    Mixed hyperlipidemia    Myocardial infarction (HCC)    Obstructive sleep apnea    OP (osteoporosis)    Osteoarthritis    Osteoporosis    Other malaise    Overweight(278.02)    PAD (peripheral artery disease) (HCC)    a. Emboli to R foot 2010 from partially occlusive lesion in R EIA, s/p stenting. - followed by Dr. Arbie Cookey;  b. 10/2015 ABIs: R 1.03, L 0.97.   Restless leg    Sleep apnea    Stroke [facility])    TIA (transient ischemic attack)    Tobacco abuse    Urge incontinence    Past Surgical History:  Procedure Laterality Date   CYST EXCISION Left 01/2021   left foot   EYE SURGERY     at age 13   hysterectomy -age 7     ILIAC ARTERY STENT     RIGHT ILIAC STENT   KNEE ARTHROSCOPY     LEFT HEART CATH AND CORONARY ANGIOGRAPHY N/A 03/05/2022   Procedure: LEFT HEART CATH AND CORONARY ANGIOGRAPHY;  Surgeon: Corky Crafts, MD;  Location: [facility] INVAS[facility];  Service: Cardiovascular;  Laterality: N/A;   LITHOTRIPSY Left 12/2020   LUMBAR LAMINECTOMY  TUBAL LIGATION       PHYSICAL EXAM:   Vitals:   05/07/23 1536  BP: 132/68  Weight: 159 lb (72.1 kg)  Height: 5\' 5"  (1.651 m)   Not recorded     Body mass index is 26.46 kg/m.  PHYSICAL EXAMNIATION:  Gen: NAD, conversant, well nourised, well groomed                     Cardiovascular: Regular rate rhythm, no peripheral edema, warm, nontender. Eyes: Conjunctivae clear without exudates or hemorrhage Neck: Supple, no carotid bruits. Pulmonary: Clear to auscultation bilaterally   NEUROLOGICAL EXAM:  MENTAL STATUS: Speech/cognition: Depressed looking elderly female awake, alert, oriented to history taking and  casual conversation CRANIAL NERVES: CN II: Visual fields are full to confrontation. Pupils are round equal and briskly reactive to light. CN III, IV, VI: extraocular movement are normal. No ptosis. CN V: Facial sensation is intact to light touch CN VII: Face is symmetric with normal eye closure  CN VIII: Hearing is normal to causal conversation. CN IX, X: Phonation is normal. CN XI: Head turning and shoulder shrug are intact  MOTOR: There is no pronator drift of out-stretched arms. Muscle bulk and tone are normal. Muscle strength is normal.  REFLEXES: Reflexes are 2+ and symmetric at the biceps, triceps, knees, and ankles. Plantar responses are flexor.  SENSORY: Intact to light touch, pinprick and vibratory sensation are intact in fingers and toes.  COORDINATION: There is no trunk or limb dysmetria noted.  GAIT/STANCE: She needs push-up to get up from seated position, mildly antalgic,  REVIEW OF SYSTEMS:  Full 14 system review of systems performed and notable only for as above All other review of systems were negative.   ALLERGIES: Allergies  Allergen Reactions   Abilify [Aripiprazole]     confusion   Latex Rash    HOME MEDICATIONS: Current Outpatient Medications  Medication Sig Dispense Refill   albuterol (VENTOLIN HFA) 108 (90 Base) MCG/ACT inhaler Inhale 2 puffs into the lungs every 4 (four) hours as needed for wheezing or shortness of breath.     ALPRAZolam (XANAX) 0.5 MG tablet TAKE ONE TABLET BY MOUTH three times daily (Patient taking differently: Take 0.25 mg by mouth 3 (three) times daily.) 90 tablet 1   Aspirin-Acetaminophen (GOODYS BODY PAIN PO) Take 1 packet by mouth daily.     baclofen (LIORESAL) 10 MG tablet Take 10 mg by mouth 3 (three) times daily as needed.     blood glucose meter kit and supplies KIT Dispense based on patient and insurance preference. Use up to four times daily as directed. 1 each 0   clopidogrel (PLAVIX) 75 MG tablet Take 1 tablet (75 mg  total) by mouth every morning. 90 tablet 1   docusate sodium (COLACE) 100 MG capsule Take 100 mg by mouth 2 (two) times daily as needed for mild constipation.     ezetimibe (ZETIA) 10 MG tablet Take 1 tablet (10 mg total) by mouth daily. 90 tablet 1   fenofibrate 160 MG tablet TAKE ONE TABLET BY MOUTH EVERY MORNING 90 tablet 1   fluticasone (FLONASE) 50 MCG/ACT nasal spray INSTILL 1 SPRAY IN EACH NOSTRIL ONCE DAILY (Patient taking differently: Place 1 spray into both nostrils daily as needed for rhinitis.) 16 g 1   gabapentin (NEURONTIN) 300 MG capsule Take 1 capsule (300 mg total) by mouth 2 (two) times daily. TAKE ONE CAPSULE BY MOUTH EVERYDAY AT BEDTIME 180 capsule 1   glucose blood (  ONETOUCH ULTRA) test strip DISPENSE BASED ON PATIENT AND INSURANCE PREFERENCE. USE UP TO FOUR TIMES DAILY AS DIRECTED. 400 strip 3   lamoTRIgine (LAMICTAL) 25 MG tablet TAKE TWO TABLETS BY MOUTH EVERYDAY AT BEDTIME 180 tablet 0   lansoprazole (PREVACID) 30 MG capsule TAKE ONE CAPSULE BY MOUTH BEFORE BREAKFAST 90 capsule 3   montelukast (SINGULAIR) 10 MG tablet Take 10 mg by mouth daily.     MYRBETRIQ 50 MG TB24 tablet TAKE ONE TABLET BY MOUTH ONCE DAILY 30 tablet 2   nitroGLYCERIN (NITROSTAT) 0.4 MG SL tablet Dissolve 1 tab under tongue as needed for chest pain. May repeat every 5 minutes x 2 doses. If no relief call 9-1-1. 25 tablet 2   oxyCODONE-acetaminophen (PERCOCET) 10-325 MG tablet Take 1 tablet by mouth every 6 (six) hours as needed for pain.     rosuvastatin (CRESTOR) 20 MG tablet Take 1 tablet (20 mg total) by mouth daily. 90 tablet 1   torsemide (DEMADEX) 20 MG tablet TAKE TWO TABLETS BY MOUTH ONCE DAILY (40mg  DOSE) 60 tablet 2   TRUEplus Lancets 30G MISC 1 each by Does not apply route 2 (two) times daily. E11.69 400 each 3   valACYclovir (VALTREX) 1000 MG tablet TAKE TWO TABLETS BY MOUTH twice A DAY as needed 20 tablet 2   valsartan (DIOVAN) 160 MG tablet Take 1 tablet (160 mg total) by mouth daily. 90  tablet 3   VASCEPA 1 g capsule TAKE TWO CAPSULES BY MOUTH TWICE DAILY 360 capsule 2   vitamin B-12 (CYANOCOBALAMIN) 50 MCG tablet Take 50 mcg by mouth daily.     Vitamin D, Ergocalciferol, (DRISDOL) 1.25 MG (50000 UNIT) CAPS capsule Take 1 capsule (50,000 Units total) by mouth every 7 (seven) days. 14 capsule 0   albuterol (PROVENTIL) (2.5 MG/3ML) 0.083% nebulizer solution Take 3 mLs (2.5 mg total) by nebulization every 6 (six) hours as needed for wheezing or shortness of breath. (Patient not taking: Reported on 05/07/2023) 75 mL 2   buPROPion (WELLBUTRIN XL) 150 MG 24 hr tablet Take 1 tablet (150 mg total) by mouth daily. (Patient not taking: Reported on 05/07/2023) 30 tablet 2   clotrimazole (MYCELEX) 10 MG troche TAKE ONE TABLET BY MOUTH FIVE TIMES DAILY (Patient not taking: Reported on 05/07/2023) 25 Troche 1   guaiFENesin (MUCINEX) 600 MG 12 hr tablet Take 1 tablet (600 mg total) by mouth 2 (two) times daily. (Patient not taking: Reported on 05/07/2023) 60 tablet 5   Levomilnacipran HCl ER (FETZIMA) 40 MG CP24 Take 40 mg by mouth daily. (Patient not taking: Reported on 05/07/2023) 90 capsule 0   naloxone (NARCAN) nasal spray 4 mg/0.1 mL SMARTSIG:1 Both Nares Daily (Patient not taking: Reported on 05/07/2023)     Semaglutide, 2 MG/DOSE, 8 MG/3ML SOPN Inject 2 mg as directed once a week. (Patient not taking: Reported on 05/07/2023) 9 mL 0   TRELEGY ELLIPTA 200-62.5-25 MCG/ACT AEPB Inhale 1 puff into the lungs daily.     No current facility-administered medications for this visit.    PAST MEDICAL HISTORY: Past Medical History:  Diagnosis Date   Anxiety    CAD (coronary artery disease)    a. NSTEMI 11/2008 s/p DES to LCx (3.0x12 Xience); b. NSTEMI 01/2010 secondary to thrombotic RCA lesion (non-obstructive)-->med rx (integrilin x 24 hrs + plavix); c. 09/2012 negative Myoview.   Chronic diastolic CHF (congestive heart failure) (HCC)    a. 06/2014 Echo: EF 55-60%, no rwma, Gr1 DD, mild AI.   Depression  Dizziness    Drug induced constipation    Ganglion cyst of left foot 01/17/2021   Generalized hyperhidrosis    GERD (gastroesophageal reflux disease)    Headache    Hyperlipidemia    Hypertensive heart disease    Lumbar disc disease    Metabolic encephalopathy    Mixed hyperlipidemia    Myocardial infarction (HCC)    Obstructive sleep apnea    OP (osteoporosis)    Osteoarthritis    Osteoporosis    Other malaise    Overweight(278.02)    PAD (peripheral artery disease) (HCC)    a. Emboli to R foot 2010 from partially occlusive lesion in R EIA, s/p stenting. - followed by Dr. Arbie Cookey;  b. 10/2015 ABIs: R 1.03, L 0.97.   Restless leg    Sleep apnea    Stroke (HCC)    TIA (transient ischemic attack)    Tobacco abuse    Urge incontinence     PAST SURGICAL HISTORY: Past Surgical History:  Procedure Laterality Date   CYST EXCISION Left 01/2021   left foot   EYE SURGERY     at age 1   hysterectomy -age 77     ILIAC ARTERY STENT     RIGHT ILIAC STENT   KNEE ARTHROSCOPY     LEFT HEART CATH AND CORONARY ANGIOGRAPHY N/A 03/05/2022   Procedure: LEFT HEART CATH AND CORONARY ANGIOGRAPHY;  Surgeon: Corky Crafts, MD;  Location: [facility];  Service: Cardiovascular;  Laterality: N/A;   LITHOTRIPSY Left 12/2020   LUMBAR LAMINECTOMY     TUBAL LIGATION      FAMILY HISTORY: Family History  Problem Relation Age of Onset   Alzheimer's disease Mother    Hodgkin's lymphoma Brother    Lung cancer Brother    Hepatitis C Brother    Hypothyroidism Brother    Hypertension Brother    Depression Brother    Heart disease Other        Grandfather    SOCIAL HISTORY: Social History   Socioeconomic History   Marital status: Divorced    Spouse name: Not on file   Number of children: 3   Years of education: 10 th   Highest education level: Not on file  Occupational History   Occupation: DISABLED    Employer: UNEMPLOYED  Tobacco Use   Smoking status: Every Day     Current packs/day: 0.00    Average packs/day: 2.0 packs/day for 54.0 years (108.0 ttl pk-yrs)    Types: Cigarettes    Start date: 11/20/1958    Last attempt to quit: 11/20/2012    Years since quitting: 10.4   Smokeless tobacco: Never   Tobacco comments:    2 ppd for many years.     04/13/2022 Patient smokes 1/2 pack daily or more if she is nervous  Substance and Sexual Activity   Alcohol use: No    Alcohol/week: 0.0 standard drinks of alcohol   Drug use: No   Sexual activity: Never  Other Topics Concern   Not on file  Social History Narrative   Patient is single with 3 children, 1 deceased.   Patient is right handed.   Patient has 10 th grade education.   Patient drinks 5 or more cups daily.   Social Determinants of Health   Financial Resource Strain: Low Risk  (02/27/2023)   Overall Financial Resource Strain (CARDIA)    Difficulty of Paying Living Expenses: Not hard at all  Food Insecurity: No Food Insecurity (  09/14/2022)   Hunger Vital Sign    Worried About Running Out of Food in the Last Year: Never true    Ran Out of Food in the Last Year: Never true  Transportation Needs: No Transportation Needs (02/27/2023)   PRAPARE - Administrator, Civil Service (Medical): No    Lack of Transportation (Non-Medical): No  Physical Activity: Inactive (09/14/2022)   Exercise Vital Sign    Days of Exercise per Week: 0 days    Minutes of Exercise per Session: 0 min  Stress: Stress Concern Present (09/14/2022)   [employer] of Occupational Health - Occupational Stress Questionnaire    Feeling of Stress : Rather much  Social Connections: Moderately Isolated (09/14/2022)   Social Connection and Isolation Panel [NHANES]    Frequency of Communication with Friends and Family: More than three times a week    Frequency of Social Gatherings with Friends and Family: Never    Attends Religious Services: More than 4 times per year    Active Member of [employer] or Organizations: No     Attends Banker Meetings: Never    Marital Status: Divorced  Catering manager Violence: Not At Risk (09/14/2022)   Humiliation, Afraid, Rape, and Kick questionnaire    Fear of Current or Ex-Partner: No    Emotionally Abused: No    Physically Abused: No    Sexually Abused: No      Levert Feinstein, M.D. Ph.D.  [facility] [address], [address] [place], [place] [address] Ph: [phone] Fax: [phone]  CC:  Retia Passe, NP [address] [place],  [place] [address]  Blane Ohara, MD

## 2023-05-07 NOTE — Telephone Encounter (Signed)
Called patient to scheduled her evenity shot, pt can schedule any day from 05/07/23

## 2023-05-07 NOTE — Progress Notes (Deleted)
Office Visit    Patient Name: Sonya Small Date of Encounter: 05/07/2023  Primary Care Provider:  Blane Ohara, MD Primary Cardiologist:  [name] [name], MD  Chief Complaint    72 year old female with a history of CAD s/p DES-LCx in 2010, chronic diastolic heart failure, hypertension, hyperlipidemia, carotid artery disease, PAD, OSA, and tobacco use who presents for follow-up related to CAD and heart failure.   Past Medical History    Past Medical History:  Diagnosis Date   Anxiety    CAD (coronary artery disease)    a. NSTEMI 11/2008 s/p DES to LCx (3.0x12 Xience); b. NSTEMI 01/2010 secondary to thrombotic RCA lesion (non-obstructive)-->med rx (integrilin x 24 hrs + plavix); c. 09/2012 negative Myoview.   Chronic diastolic CHF (congestive heart failure) (HCC)    a. 06/2014 Echo: EF 55-60%, no rwma, Gr1 DD, mild AI.   Depression    Dizziness    Drug induced constipation    Ganglion cyst of left foot 01/17/2021   Generalized hyperhidrosis    GERD (gastroesophageal reflux disease)    Headache    Hyperlipidemia    Hypertensive heart disease    Lumbar disc disease    Metabolic encephalopathy    Mixed hyperlipidemia    Myocardial infarction (HCC)    Obstructive sleep apnea    OP (osteoporosis)    Osteoarthritis    Osteoporosis    Other malaise    Overweight(278.02)    PAD (peripheral artery disease) (HCC)    a. Emboli to R foot 2010 from partially occlusive lesion in R EIA, s/p stenting. - followed by Dr. Arbie Cookey;  b. 10/2015 ABIs: R 1.03, L 0.97.   Restless leg    Sleep apnea    Stroke [facility])    TIA (transient ischemic attack)    Tobacco abuse    Urge incontinence    Past Surgical History:  Procedure Laterality Date   CYST EXCISION Left 01/2021   left foot   EYE SURGERY     at age 81   hysterectomy -age 68     ILIAC ARTERY STENT     RIGHT ILIAC STENT   KNEE ARTHROSCOPY     LEFT HEART CATH AND CORONARY ANGIOGRAPHY N/A 03/05/2022   Procedure: LEFT HEART CATH AND CORONARY  ANGIOGRAPHY;  Surgeon: Corky Crafts, MD;  Location: [facility] INVAS[facility];  Service: Cardiovascular;  Laterality: N/A;   LITHOTRIPSY Left 12/2020   LUMBAR LAMINECTOMY     TUBAL LIGATION      Allergies  Allergies  Allergen Reactions   Abilify [Aripiprazole]     confusion   Latex Rash     Labs/Other Studies Reviewed    The following studies were reviewed today:  Cardiac Studies & Procedures   CARDIAC CATHETERIZATION  CARDIAC CATHETERIZATION 03/05/2022  Narrative   1st Mrg lesion is 25% stenosed.   Previously placed Mid Cx stent of unknown type is  widely patent.   The left ventricular systolic function is normal.   LV end diastolic pressure is mildly elevated.   The left ventricular ejection fraction is 45-50% by visual estimate.  Apical hypokinesis consistent with Takotsubo cardiomyopathy.   There is no aortic valve stenosis.  No significant coronary artery disease.  She does have apical hypokinesis which is consistent with a Takotsubo cardiomyopathy pattern.  She has been under a lot of stress of late.  The apical hypokinesis is minimal so I suspect her Takotsubo is in the process of resolving.  Continue aggressive blood pressure  control.  Consider echocardiogram in 6 weeks.  LVEDP was mildly elevated.  Could consider short-term diuretic as well if she has significant shortness of breath.  Findings Coronary Findings Diagnostic  Dominance: Right  Left Anterior Descending The vessel exhibits minimal luminal irregularities.  Left Circumflex The vessel exhibits minimal luminal irregularities. Previously placed Mid Cx stent of unknown type is  widely patent.  First Obtuse Marginal Branch 1st Mrg lesion is 25% stenosed.  Right Coronary Artery There is mild diffuse disease throughout the vessel.  Intervention  No interventions have been documented.   STRESS TESTS  MYOCARDIAL PERFUSION IMAGING 08/22/2021  Narrative   Findings are consistent with no prior  ischemia. The study is low risk.   No ST deviation was noted.   LV perfusion is abnormal. Defect 1: There is a small defect with mild reduction in uptake present in the apical apex location(s) that is fixed. There is normal wall motion in the defect area. Consistent with artifact caused by breast attenuation.   Left ventricular function is normal. End diastolic cavity size is normal. End systolic cavity size is normal.   Prior study available for comparison from 03/25/2018. No changes compared to prior study. LVEF 52%, fixed apical attenuation artifact  Small size, mild intensity fixed apical perfusion defect, likely attenuation artifact. No reversible ischemia. LVEF 52% with normal wall motion. This is a low risk study. Compared to a prior study in 2019, there are no changes (the apical perfusion defect was previously reported) - LVEF is unchanged.   ECHOCARDIOGRAM  ECHOCARDIOGRAM COMPLETE 04/09/2023  Narrative ECHOCARDIOGRAM REPORT    Patient Name:   Sonya Small Date of Exam: 04/09/2023 Medical Rec #:  [id number]    Height:       65.0 in Accession #:    [id number]   Weight:       164.6 lb Date of Birth:  1950-09-26    BSA:          1.821 m Patient Age:    71 years     BP:           140/64 mmHg Patient Gender: F            HR:           75 bpm. Exam Location:  [place]  Procedure: 2D Echo, Cardiac Doppler, Color Doppler and Strain Analysis  Indications:    Chronic diastolic heart failure (HCC) [I50.32 (ICD-10-CM)]  History:        Patient has prior history of Echocardiogram examinations, most recent 07/13/2014. CHF, CAD; Risk Factors:Current Smoker, Hypertension and Dyslipidemia.  Sonographer:    Margreta Journey RDCS Referring Phys: [phone]  C   IMPRESSIONS   1. Left ventricular ejection fraction, by estimation, is 55 to 60%. The left ventricle has normal function. The left ventricle has no regional wall motion abnormalities. There is mild asymmetric left ventricular  hypertrophy of the septal segment. Left ventricular diastolic parameters are consistent with Grade I diastolic dysfunction (impaired relaxation). The average left ventricular global longitudinal strain is -14.1 %. The global longitudinal strain is abnormal. 2. Right ventricular systolic function is normal. The right ventricular size is normal. There is normal pulmonary artery systolic pressure. 3. The mitral valve is normal in structure. Mild mitral valve regurgitation. No evidence of mitral stenosis. 4. The aortic valve is normal in structure. Aortic valve regurgitation is mild. No aortic stenosis is present. 5. Aneurysm of the ascending aorta, measuring 40 mm. 6. The inferior vena cava is  normal in size with greater than 50% respiratory variability, suggesting right atrial pressure of 3 mmHg.  FINDINGS Left Ventricle: Left ventricular ejection fraction, by estimation, is 55 to 60%. The left ventricle has normal function. The left ventricle has no regional wall motion abnormalities. The average left ventricular global longitudinal strain is -14.1 %. The global longitudinal strain is abnormal. The left ventricular internal cavity size was normal in size. There is mild asymmetric left ventricular hypertrophy of the septal segment. Left ventricular diastolic parameters are consistent with Grade I diastolic dysfunction (impaired relaxation).  Right Ventricle: The right ventricular size is normal. No increase in right ventricular wall thickness. Right ventricular systolic function is normal. There is normal pulmonary artery systolic pressure. The tricuspid regurgitant velocity is 2.64 m/s, and with an assumed right atrial pressure of 3 mmHg, the estimated right ventricular systolic pressure is 30.9 mmHg.  Left Atrium: Left atrial size was normal in size.  Right Atrium: Right atrial size was normal in size.  Pericardium: There is no evidence of pericardial effusion.  Mitral Valve: The mitral valve  is normal in structure. Mild mitral valve regurgitation. No evidence of mitral valve stenosis.  Tricuspid Valve: The tricuspid valve is normal in structure. Tricuspid valve regurgitation is not demonstrated. No evidence of tricuspid stenosis.  Aortic Valve: The aortic valve is normal in structure. Aortic valve regurgitation is mild. Aortic regurgitation PHT measures 590 msec. No aortic stenosis is present.  Pulmonic Valve: The pulmonic valve was normal in structure. Pulmonic valve regurgitation is not visualized. No evidence of pulmonic stenosis.  Aorta: The aortic root is normal in size and structure. There is an aneurysm involving the ascending aorta measuring 40 mm.  Venous: The inferior vena cava is normal in size with greater than 50% respiratory variability, suggesting right atrial pressure of 3 mmHg.  IAS/Shunts: No atrial level shunt detected by color flow Doppler.   LEFT VENTRICLE PLAX 2D LVIDd:         4.40 cm   Diastology LVIDs:         3.40 cm   LV e' medial:    6.20 cm/s LV PW:         1.00 cm   LV E/e' medial:  11.6 LV IVS:        1.30 cm   LV e' lateral:   9.57 cm/s LVOT diam:     1.90 cm   LV E/e' lateral: 7.5 LV SV:         74 LV SV Index:   40        2D Longitudinal Strain LVOT Area:     2.84 cm  2D Strain GLS Avg:     -14.1 %   RIGHT VENTRICLE             IVC RV Basal diam:  2.90 cm     IVC diam: 2.10 cm RV Mid diam:    2.40 cm RV S prime:     14.20 cm/s TAPSE (M-mode): 2.8 cm  LEFT ATRIUM             Index        RIGHT ATRIUM           Index LA diam:        3.30 cm 1.81 cm/m   RA Area:     11.90 cm LA Vol (A2C):   54.8 ml 30.09 ml/m  RA Volume:   23.50 ml  12.91 ml/m LA Vol (A4C):   35.6  ml 19.55 ml/m LA Biplane Vol: 48.2 ml 26.47 ml/m AORTIC VALVE LVOT Vmax:   115.00 cm/s LVOT Vmean:  79.500 cm/s LVOT VTI:    0.259 m AI PHT:      590 msec  AORTA Ao Root diam: 3.50 cm Ao Asc diam:  4.00 cm Ao Desc diam: 2.50 cm  MITRAL VALVE                 TRICUSPID VALVE MV Area (PHT): 3.03 cm     TR Peak grad:   27.9 mmHg MV Decel Time: 250 msec     TR Vmax:        264.00 cm/s MR Peak grad: 81.7 mmHg MR Vmax:      452.00 cm/s   SHUNTS MV E velocity: 71.80 cm/s   Systemic VTI:  0.26 m MV A velocity: 115.00 cm/s  Systemic Diam: 1.90 cm MV E/A ratio:  0.62  Gypsy Balsam MD Electronically signed by Gypsy Balsam MD Signature Date/Time: 04/09/2023/3:59:47 PM    Final            Recent Labs: 02/12/2023: NT-Pro BNP 125 03/08/2023: BNP 25.5 04/26/2023: ALT 22; BUN 19; Creatinine, Ser 0.74; Hemoglobin 13.6; Platelets 249; Potassium 4.5; Sodium 145; TSH 0.734  Recent Lipid Panel    Component Value Date/Time   CHOL 112 04/26/2023 1052   TRIG 108 04/26/2023 1052   HDL 47 04/26/2023 1052   CHOLHDL 2.4 04/26/2023 1052   CHOLHDL 5.0 07/13/2014 0108   VLDL 42 (H) 07/13/2014 0108   LDLCALC 45 04/26/2023 1052    History of Present Illness    72 year old female with the above past medical history including CAD s/p DES-left circumflex, chronic diastolic heart failure, PAD, CVA, OSA, hypertension, and hyperlipidemia.   She was hospitalized in March 2010 the setting of NSTEMI s/p DES-LCx.  She had another NSTEMI in May 2011 secondary to thrombotic RCA lesion, cardiac catheterization showed otherwise nonobstructive disease, medical therapy was recommended.  She had recurrent chest pain in January 2014, Myoview was negative.  She was hospitalized in October 2015 in the setting of acute CVA.  She was started on aspirin and Plavix.  ABIs in 2018 were normal.  Repeat Myoview in July 2019 showed EF 55%, small moderate intensity fixed apical perfusion defect, likely attenuation artifact, no other reversible ischemia, overall low risk.  At her follow-up visit in 04/2018 she reported dizziness.  This was thought to be in the setting of polypharmacy.  Carotid Dopplers were checked and were unremarkable.  She was hospitalized in May 2020 with pneumonia and  respiratory failure requiring ventilator support.  He did smoke at the time.  Repeat Myoview in 07/2021 in the setting of pain across her shoulder blades and upper back was low risk, unchanged from prior.  At her follow-up visit in 02/2022 she noted ongoing intermittent chest pain, difficulty staying awake, numbness in her fingers and toes as well as blue discoloration of her toes.  Cardiac catheterization 06/29/2022 showed no significant coronary artery disease, apical hypokinesis consistent with Takotsubo cardiomyopathy pattern.  Lower extremity arterial duplex, ABIs were normal.  She was last seen in the office on 03/08/2023 and was stable overall from a cardiac standpoint.  She noted intermittent chest discomfort, unchanged from prior visits, stable mild dyspnea on exertion, ongoing bilateral lower extremity edema.  Her BP was elevated.  BNP was normal.  Echocardiogram showed EF 55 to 60%, normal LV function, no RWMA, mild LVH, G1 DD, normal RV, mild mitral valve  regurgitation, mild aortic valve regurgitation, mild dilation of the ascending aorta measuring 40 mm.   She presents today for follow-up accompanied by her sister.  Since her last visit she has   1. CAD: Cardiac catheterization 06/29/2022 showed no significant coronary artery disease, apical hypokinesis consistent with Takotsubo cardiomyopathy pattern. She has stable intermittent chest discomfort, unchanged from prior visits, stable mild dyspnea on exertion. Given recent reassuring ischemic evaluation, will defer further ischemic evaluation at this time.  Continue to monitor symptoms.  Continue Plavix, valsartan, Crestor, Vascepa, Zetia, fenofibrate.   2. Chronic diastolic heart failure: She notes persistent nonpitting bilateral lower extremity edema despite increased torsemide dosing.  Otherwise, euvolemic and well compensated on exam. Will check BNP, CMET today.  Will repeat echo. If BNP is elevated, could consider addition of chlorthalidone versus  spironolactone.  If echo shows reduced EF, consider escalation of GDMT as renal function and BP allow.  Discussed ongoing monitoring with daily weights, sodium and fluid recommendations.  Continue valsartan, torsemide.   3. Hypertension/hypotension: She has a history of recent hypotension, BP has been recently elevated.  Valsartan was increased.  Continue to monitor BP and report BP consistently greater than 140/80.  For now, continue current antihypertensive regimen.    4. PAD: S/p right external iliac artery stenting. Lower extremity arterial duplex in 02/2022 showed greater than 50% stenosis of the left common iliac artery.  ABIs in 02/2022 were unremarkable.  She notes occasional numbness in her feet, stable overall.  Continue Plavix, Crestor, Vascepa, Zetia.   5. Hyperlipidemia: LDL was 39 in 01/2023.  Continue Crestor, Vascepa, Zetia.   6. OSA: Adherent to CPAP.         7. History of CVA: Recent MRI per PCP showed chronic cerebellar stroke.  She does have issues with balance.  Otherwise, stable.  Continue Plavix, Crestor, Vascepa, Zetia.   8. Disposition: Follow-up in 2 months with APP, follow-up in 4 months with Dr. [name] as scheduled.    Home Medications    Current Outpatient Medications  Medication Sig Dispense Refill   albuterol (PROVENTIL) (2.5 MG/3ML) 0.083% nebulizer solution Take 3 mLs (2.5 mg total) by nebulization every 6 (six) hours as needed for wheezing or shortness of breath. 75 mL 2   albuterol (VENTOLIN HFA) 108 (90 Base) MCG/ACT inhaler Inhale 2 puffs into the lungs every 4 (four) hours as needed for wheezing or shortness of breath.     ALPRAZolam (XANAX) 0.5 MG tablet TAKE ONE TABLET BY MOUTH three times daily 90 tablet 1   Aspirin-Acetaminophen (GOODYS BODY PAIN PO) Take 1 packet by mouth daily.     baclofen (LIORESAL) 10 MG tablet Take 10 mg by mouth 3 (three) times daily as needed.     blood glucose meter kit and supplies KIT Dispense based on patient and insurance  preference. Use up to four times daily as directed. 1 each 0   buPROPion (WELLBUTRIN XL) 150 MG 24 hr tablet Take 1 tablet (150 mg total) by mouth daily. 30 tablet 2   clopidogrel (PLAVIX) 75 MG tablet Take 1 tablet (75 mg total) by mouth every morning. 90 tablet 1   clotrimazole (MYCELEX) 10 MG troche TAKE ONE TABLET BY MOUTH FIVE TIMES DAILY 25 Troche 1   docusate sodium (COLACE) 100 MG capsule Take 100 mg by mouth 2 (two) times daily as needed for mild constipation.     ezetimibe (ZETIA) 10 MG tablet Take 1 tablet (10 mg total) by mouth daily. 90 tablet 1  fenofibrate 160 MG tablet TAKE ONE TABLET BY MOUTH EVERY MORNING 90 tablet 1   fluticasone (FLONASE) 50 MCG/ACT nasal spray INSTILL 1 SPRAY IN EACH NOSTRIL ONCE DAILY (Patient taking differently: Place 1 spray into both nostrils daily as needed for rhinitis.) 16 g 1   gabapentin (NEURONTIN) 300 MG capsule Take 1 capsule (300 mg total) by mouth 2 (two) times daily. TAKE ONE CAPSULE BY MOUTH EVERYDAY AT BEDTIME 180 capsule 1   glucose blood (ONETOUCH ULTRA) test strip DISPENSE BASED ON PATIENT AND INSURANCE PREFERENCE. USE UP TO FOUR TIMES DAILY AS DIRECTED. 400 strip 3   guaiFENesin (MUCINEX) 600 MG 12 hr tablet Take 1 tablet (600 mg total) by mouth 2 (two) times daily. 60 tablet 5   lamoTRIgine (LAMICTAL) 25 MG tablet TAKE TWO TABLETS BY MOUTH EVERYDAY AT BEDTIME 180 tablet 0   lansoprazole (PREVACID) 30 MG capsule TAKE ONE CAPSULE BY MOUTH BEFORE BREAKFAST 90 capsule 3   Levomilnacipran HCl ER (FETZIMA) 40 MG CP24 Take 40 mg by mouth daily. 90 capsule 0   montelukast (SINGULAIR) 10 MG tablet Take 10 mg by mouth daily.     MYRBETRIQ 50 MG TB24 tablet TAKE ONE TABLET BY MOUTH ONCE DAILY 30 tablet 2   naloxone (NARCAN) nasal spray 4 mg/0.1 mL SMARTSIG:1 Both Nares Daily     nitroGLYCERIN (NITROSTAT) 0.4 MG SL tablet Dissolve 1 tab under tongue as needed for chest pain. May repeat every 5 minutes x 2 doses. If no relief call 9-1-1. 25 tablet 2    oxyCODONE-acetaminophen (PERCOCET) 10-325 MG tablet Take 1 tablet by mouth every 6 (six) hours as needed for pain.     rosuvastatin (CRESTOR) 20 MG tablet Take 1 tablet (20 mg total) by mouth daily. 90 tablet 1   Semaglutide, 2 MG/DOSE, 8 MG/3ML SOPN Inject 2 mg as directed once a week. 9 mL 0   torsemide (DEMADEX) 20 MG tablet TAKE TWO TABLETS BY MOUTH ONCE DAILY (40mg  DOSE) 60 tablet 2   TRELEGY ELLIPTA 200-62.5-25 MCG/ACT AEPB Inhale 1 puff into the lungs daily.     TRUEplus Lancets 30G MISC 1 each by Does not apply route 2 (two) times daily. E11.69 400 each 3   valACYclovir (VALTREX) 1000 MG tablet TAKE TWO TABLETS BY MOUTH twice A DAY as needed 20 tablet 2   valsartan (DIOVAN) 160 MG tablet Take 1 tablet (160 mg total) by mouth daily. 90 tablet 3   VASCEPA 1 g capsule TAKE TWO CAPSULES BY MOUTH TWICE DAILY 360 capsule 2   Vitamin D, Ergocalciferol, (DRISDOL) 1.25 MG (50000 UNIT) CAPS capsule Take 1 capsule (50,000 Units total) by mouth every 7 (seven) days. 14 capsule 0   No current facility-administered medications for this visit.     Review of Systems    ***.  All other systems reviewed and are otherwise negative except as noted above.    Physical Exam    VS:  There were no vitals taken for this visit. , BMI There is no height or weight on file to calculate BMI.     GEN: Well nourished, well developed, in no acute distress. HEENT: normal. Neck: Supple, no JVD, carotid bruits, or masses. Cardiac: RRR, no murmurs, rubs, or gallops. No clubbing, cyanosis, edema.  Radials/DP/PT 2+ and equal bilaterally.  Respiratory:  Respirations regular and unlabored, clear to auscultation bilaterally. GI: Soft, nontender, nondistended, BS + x 4. MS: no deformity or atrophy. Skin: warm and dry, no rash. Neuro:  Strength and sensation are  intact. Psych: Normal affect.  Accessory Clinical Findings    ECG personally reviewed by me today -    - no acute changes.   Lab Results  Component Value  Date   WBC 7.9 04/26/2023   HGB 13.6 04/26/2023   HCT 41.6 04/26/2023   MCV 88 04/26/2023   PLT 249 04/26/2023   Lab Results  Component Value Date   CREATININE 0.74 04/26/2023   BUN 19 04/26/2023   NA 145 (H) 04/26/2023   K 4.5 04/26/2023   CL 105 04/26/2023   CO2 23 04/26/2023   Lab Results  Component Value Date   ALT 22 04/26/2023   AST 25 04/26/2023   ALKPHOS 103 04/26/2023   BILITOT 0.2 04/26/2023   Lab Results  Component Value Date   CHOL 112 04/26/2023   HDL 47 04/26/2023   LDLCALC 45 04/26/2023   TRIG 108 04/26/2023   CHOLHDL 2.4 04/26/2023    Lab Results  Component Value Date   HGBA1C 7.4 (H) 04/26/2023    Assessment & Plan    1.  ***  No BP recorded.  {Refresh Note OR Click here to enter BP  :1}***   Joylene Grapes, NP 05/07/2023, 6:08 AM

## 2023-05-08 ENCOUNTER — Telehealth: Payer: Self-pay

## 2023-05-08 ENCOUNTER — Ambulatory Visit (INDEPENDENT_AMBULATORY_CARE_PROVIDER_SITE_OTHER): Payer: Medicare Other

## 2023-05-08 ENCOUNTER — Telehealth: Payer: Self-pay | Admitting: Neurology

## 2023-05-08 DIAGNOSIS — R269 Unspecified abnormalities of gait and mobility: Secondary | ICD-10-CM | POA: Insufficient documentation

## 2023-05-08 DIAGNOSIS — M810 Age-related osteoporosis without current pathological fracture: Secondary | ICD-10-CM

## 2023-05-08 MED ORDER — ROMOSOZUMAB-AQQG 105 MG/1.17ML ~~LOC~~ SOSY
210.0000 mg | PREFILLED_SYRINGE | Freq: Once | SUBCUTANEOUS | Status: AC
Start: 2023-05-08 — End: 2023-05-08
  Administered 2023-05-08: 210 mg via SUBCUTANEOUS

## 2023-05-08 NOTE — Telephone Encounter (Signed)
Patient called in regarding her appointment with you yesterday. States that you had recommended sending her for physical therapy and at the time, she had declined. She is now calling back to see if that referral for PT can be placed to a location in [place].

## 2023-05-08 NOTE — Progress Notes (Signed)
   Patient: Sonya Small  DOB: June 03, 1951  MRN: [id number]    Visit Date: 05/08/2023    Sonya Small presents today for her monthly Evenity injection.  2 x 105 mg/1.17 ml Single-Dose Prefilled Syringes were given SQ, one in each arm.  Patient tolerated the injection well and has no questions.  The next Evenity injections will be due in one month.      Jomarie Longs, CMA

## 2023-05-08 NOTE — Telephone Encounter (Signed)
  PT order was placed [facility] in [place] 5.0 (6)  Physical therapy clinic [address]  [phone]

## 2023-05-08 NOTE — Telephone Encounter (Signed)
Referral for physical therapy fax to [facility]. Phone [phone], Fax: [phone].

## 2023-05-15 ENCOUNTER — Other Ambulatory Visit: Payer: Self-pay | Admitting: Family Medicine

## 2023-05-16 ENCOUNTER — Ambulatory Visit (INDEPENDENT_AMBULATORY_CARE_PROVIDER_SITE_OTHER): Payer: Medicare Other | Admitting: Neurology

## 2023-05-16 DIAGNOSIS — G4710 Hypersomnia, unspecified: Secondary | ICD-10-CM

## 2023-05-16 DIAGNOSIS — I679 Cerebrovascular disease, unspecified: Secondary | ICD-10-CM | POA: Diagnosis not present

## 2023-05-17 ENCOUNTER — Other Ambulatory Visit: Payer: Self-pay | Admitting: Family Medicine

## 2023-06-04 NOTE — Procedures (Signed)
   HISTORY: 72 year old female history of stroke, vivid dreams, sometimes confused with reality  TECHNIQUE:  This is a routine 16 channel EEG recording with one channel devoted to a limited EKG recording.  It was performed during wakefulness, drowsiness and asleep.  Photic stimulation were performed as activating procedures.  There are minimum muscle and movement artifact noted.  Upon maximum arousal, posterior dominant waking rhythm consistent of rhythmic alpha range activity. Activities are symmetric over the bilateral posterior derivations and attenuated with eye opening.  Photic stimulation did not alter the tracing.  Hyperventilation was not performed  During EEG recording, patient developed drowsiness and entered sleep, sleep EEG demonstrated architecture, there were frontal centrally dominant vertex waves and symmetric sleep spindles noted.  During EEG recording, there was no epileptiform discharge noted.  EKG demonstrate normal sinus rhythm.  CONCLUSION: This is a  normal awake and asleep EEG.  There is no electrodiagnostic evidence of epileptiform discharge.  Levert Feinstein, M.D. Ph.D.  [facility] [address] [place], [place] [address] Phone: [phone] Fax:      [phone]

## 2023-06-10 ENCOUNTER — Ambulatory Visit (INDEPENDENT_AMBULATORY_CARE_PROVIDER_SITE_OTHER): Payer: Medicare Other | Admitting: Family Medicine

## 2023-06-10 ENCOUNTER — Telehealth: Payer: Self-pay

## 2023-06-10 VITALS — BP 144/60 | HR 76 | Temp 97.8°F | Resp 16 | Ht 65.0 in | Wt 160.0 lb

## 2023-06-10 DIAGNOSIS — I110 Hypertensive heart disease with heart failure: Secondary | ICD-10-CM | POA: Diagnosis not present

## 2023-06-10 DIAGNOSIS — E1140 Type 2 diabetes mellitus with diabetic neuropathy, unspecified: Secondary | ICD-10-CM

## 2023-06-10 DIAGNOSIS — Z23 Encounter for immunization: Secondary | ICD-10-CM

## 2023-06-10 DIAGNOSIS — I5042 Chronic combined systolic (congestive) and diastolic (congestive) heart failure: Secondary | ICD-10-CM

## 2023-06-10 DIAGNOSIS — M810 Age-related osteoporosis without current pathological fracture: Secondary | ICD-10-CM | POA: Diagnosis not present

## 2023-06-10 DIAGNOSIS — R300 Dysuria: Secondary | ICD-10-CM | POA: Diagnosis not present

## 2023-06-10 LAB — POCT URINALYSIS DIP (CLINITEK)
Bilirubin, UA: NEGATIVE
Blood, UA: NEGATIVE
Glucose, UA: NEGATIVE mg/dL
Ketones, POC UA: NEGATIVE mg/dL
Leukocytes, UA: NEGATIVE
Nitrite, UA: NEGATIVE
POC PROTEIN,UA: NEGATIVE
Spec Grav, UA: 1.015 (ref 1.010–1.025)
Urobilinogen, UA: 0.2 U/dL
pH, UA: 6 (ref 5.0–8.0)

## 2023-06-10 MED ORDER — VALSARTAN 320 MG PO TABS
320.0000 mg | ORAL_TABLET | Freq: Every day | ORAL | 0 refills | Status: DC
Start: 2023-06-10 — End: 2023-07-06

## 2023-06-10 MED ORDER — ROMOSOZUMAB-AQQG 105 MG/1.17ML ~~LOC~~ SOSY
210.0000 mg | PREFILLED_SYRINGE | Freq: Once | SUBCUTANEOUS | Status: AC
Start: 2023-06-10 — End: 2023-06-10
  Administered 2023-06-10: 210 mg via SUBCUTANEOUS

## 2023-06-10 MED ORDER — VALSARTAN 320 MG PO TABS: 320.0000 mg | ORAL_TABLET | Freq: Every day | ORAL | 0 refills | Status: DC

## 2023-06-10 NOTE — Patient Instructions (Addendum)
Increase valsartan 320 mg once daily.  Continue ozempic of 2 mg weekly.  CALL [facility] TO DISCUSS PILL PACKS AND PAYMENT PLAN.  [phone]  CALL  OUTPATIENT TO DISCUSS PILL PACKS AND PAYMENT PLAN.  [phone]

## 2023-06-10 NOTE — Progress Notes (Unsigned)
Subjective:  Patient ID: Sonya Small, female    DOB: May 29, 1951  Age: 72 y.o. MRN: [id number]  Chief Complaint  Patient presents with   Hypertension    HPI She comes in for follow-up of her hypertension. BP 134/72-169/87.   She also is due to get her evenity today.  She reports falling 1 month ago.  She was putting her dog out and then she remembers waking up on the ground.    Diabetes: sugars 123-140 except two readings of 170 and 182, but accounted for by what she eat.  I increased ozempic 2 mg weekly.      06/10/2023   11:44 AM 06/10/2023   11:04 AM 04/29/2023   11:39 AM 01/22/2023    8:47 AM 09/14/2022    9:02 AM  Depression screen PHQ 2/9  Decreased Interest  0 3 0 3  Down, Depressed, Hopeless  0 3 3 3   PHQ - 2 Score  0 6 3 6   Altered sleeping 2  3 3  0  Tired, decreased energy 3  3 3 3   Change in appetite 0  1 3 0  Feeling bad or failure about yourself  3  3 1  0  Trouble concentrating 3  3 3 1   Moving slowly or fidgety/restless 0  3 3 0  Suicidal thoughts 0   0 0  PHQ-9 Score   22 19 10   Difficult doing work/chores Somewhat difficult  Somewhat difficult Very difficult Somewhat difficult        06/10/2023   11:00 AM  Fall Risk   Falls in the past year? 1  Number falls in past yr: 1  Injury with Fall? 1  Risk for fall due to : History of fall(s)  Follow up Falls evaluation completed;Falls prevention discussed    Patient Care Team: Blane Ohara, MD as PCP - General (Family Medicine) [name], [name], MD as PCP - Cardiology (Cardiology) Marcellus Scott, MD as Referring Physician (Specialist)   Review of Systems  Constitutional:  Positive for fatigue. Negative for chills and fever.  HENT:  Positive for congestion. Negative for ear pain, rhinorrhea and sore throat.   Respiratory:  Positive for cough. Negative for shortness of breath.   Cardiovascular:  Positive for chest pain and leg swelling.  Gastrointestinal:  Negative for abdominal pain, constipation, diarrhea,  nausea and vomiting.  Genitourinary:  Negative for dysuria and urgency.  Musculoskeletal:  Negative for back pain and myalgias.       Painful area below right scapula x 2-3 months.  Neurological:  Positive for dizziness, numbness (hands and feet.) and headaches. Negative for weakness and light-headedness.  Psychiatric/Behavioral:  Negative for dysphoric mood. The patient is nervous/anxious.     Current Outpatient Medications on File Prior to Visit  Medication Sig Dispense Refill   albuterol (PROVENTIL) (2.5 MG/3ML) 0.083% nebulizer solution Take 3 mLs (2.5 mg total) by nebulization every 6 (six) hours as needed for wheezing or shortness of breath. (Patient not taking: Reported on 05/07/2023) 75 mL 2   albuterol (VENTOLIN HFA) 108 (90 Base) MCG/ACT inhaler Inhale 2 puffs into the lungs every 4 (four) hours as needed for wheezing or shortness of breath.     ALPRAZolam (XANAX) 0.5 MG tablet TAKE ONE TABLET BY MOUTH three times daily (Patient taking differently: Take 0.25 mg by mouth 3 (three) times daily.) 90 tablet 1   Aspirin-Acetaminophen (GOODYS BODY PAIN PO) Take 1 packet by mouth daily.     baclofen (LIORESAL) 10 MG  tablet Take 10 mg by mouth 3 (three) times daily as needed.     blood glucose meter kit and supplies KIT Dispense based on patient and insurance preference. Use up to four times daily as directed. 1 each 0   buPROPion (WELLBUTRIN XL) 150 MG 24 hr tablet Take 1 tablet (150 mg total) by mouth daily. (Patient not taking: Reported on 05/07/2023) 30 tablet 2   clopidogrel (PLAVIX) 75 MG tablet Take 1 tablet (75 mg total) by mouth every morning. 90 tablet 1   clotrimazole (MYCELEX) 10 MG troche TAKE ONE TABLET BY MOUTH five times daily 25 Troche 1   docusate sodium (COLACE) 100 MG capsule Take 100 mg by mouth 2 (two) times daily as needed for mild constipation.     ezetimibe (ZETIA) 10 MG tablet Take 1 tablet (10 mg total) by mouth daily. 90 tablet 1   fenofibrate 160 MG tablet TAKE ONE  TABLET BY MOUTH EVERY MORNING 90 tablet 1   fluticasone (FLONASE) 50 MCG/ACT nasal spray INSTILL 1 SPRAY IN EACH NOSTRIL ONCE DAILY (Patient taking differently: Place 1 spray into both nostrils daily as needed for rhinitis.) 16 g 1   gabapentin (NEURONTIN) 300 MG capsule Take 1 capsule (300 mg total) by mouth 2 (two) times daily. TAKE ONE CAPSULE BY MOUTH EVERYDAY AT BEDTIME 180 capsule 1   glucose blood (ONETOUCH ULTRA) test strip DISPENSE BASED ON PATIENT AND INSURANCE PREFERENCE. USE UP TO FOUR TIMES DAILY AS DIRECTED. 400 strip 3   guaiFENesin (MUCINEX) 600 MG 12 hr tablet Take 1 tablet (600 mg total) by mouth 2 (two) times daily. (Patient not taking: Reported on 05/07/2023) 60 tablet 5   lamoTRIgine (LAMICTAL) 25 MG tablet TAKE TWO TABLETS BY MOUTH EVERYDAY AT BEDTIME 180 tablet 0   lansoprazole (PREVACID) 30 MG capsule TAKE ONE CAPSULE BY MOUTH BEFORE BREAKFAST 90 capsule 3   Levomilnacipran HCl ER (FETZIMA) 40 MG CP24 Take 40 mg by mouth daily. (Patient not taking: Reported on 05/07/2023) 90 capsule 0   montelukast (SINGULAIR) 10 MG tablet Take 10 mg by mouth daily.     MYRBETRIQ 50 MG TB24 tablet TAKE ONE TABLET BY MOUTH ONCE DAILY 30 tablet 2   naloxone (NARCAN) nasal spray 4 mg/0.1 mL SMARTSIG:1 Both Nares Daily (Patient not taking: Reported on 05/07/2023)     nitroGLYCERIN (NITROSTAT) 0.4 MG SL tablet Dissolve 1 tab under tongue as needed for chest pain. May repeat every 5 minutes x 2 doses. If no relief call 9-1-1. 25 tablet 2   oxyCODONE-acetaminophen (PERCOCET) 10-325 MG tablet Take 1 tablet by mouth every 6 (six) hours as needed for pain.     rosuvastatin (CRESTOR) 20 MG tablet Take 1 tablet (20 mg total) by mouth daily. 90 tablet 1   Semaglutide, 2 MG/DOSE, 8 MG/3ML SOPN Inject 2 mg as directed once a week. (Patient not taking: Reported on 05/07/2023) 9 mL 0   torsemide (DEMADEX) 20 MG tablet TAKE TWO TABLETS BY MOUTH ONCE DAILY (40mg  DOSE) 60 tablet 2   TRELEGY ELLIPTA 200-62.5-25  MCG/ACT AEPB Inhale 1 puff into the lungs daily.     TRUEplus Lancets 30G MISC 1 each by Does not apply route 2 (two) times daily. E11.69 400 each 3   valACYclovir (VALTREX) 1000 MG tablet TAKE TWO TABLETS BY MOUTH twice daily AS NEEDED 20 tablet 2   valsartan (DIOVAN) 160 MG tablet Take 1 tablet (160 mg total) by mouth daily. 90 tablet 3   VASCEPA 1 g capsule TAKE  TWO CAPSULES BY MOUTH TWICE DAILY 360 capsule 2   vitamin B-12 (CYANOCOBALAMIN) 50 MCG tablet Take 50 mcg by mouth daily.     Vitamin D, Ergocalciferol, (DRISDOL) 1.25 MG (50000 UNIT) CAPS capsule Take 1 capsule (50,000 Units total) by mouth every 7 (seven) days. 14 capsule 0   No current facility-administered medications on file prior to visit.   Past Medical History:  Diagnosis Date   Anxiety    CAD (coronary artery disease)    a. NSTEMI 11/2008 s/p DES to LCx (3.0x12 Xience); b. NSTEMI 01/2010 secondary to thrombotic RCA lesion (non-obstructive)-->med rx (integrilin x 24 hrs + plavix); c. 09/2012 negative Myoview.   Chronic diastolic CHF (congestive heart failure) (HCC)    a. 06/2014 Echo: EF 55-60%, no rwma, Gr1 DD, mild AI.   Depression    Dizziness    Drug induced constipation    Ganglion cyst of left foot 01/17/2021   Generalized hyperhidrosis    GERD (gastroesophageal reflux disease)    Headache    Hyperlipidemia    Hypertensive heart disease    Lumbar disc disease    Metabolic encephalopathy    Mixed hyperlipidemia    Myocardial infarction (HCC)    Obstructive sleep apnea    OP (osteoporosis)    Osteoarthritis    Osteoporosis    Other malaise    Overweight(278.02)    PAD (peripheral artery disease) (HCC)    a. Emboli to R foot 2010 from partially occlusive lesion in R EIA, s/p stenting. - followed by Dr. Arbie Cookey;  b. 10/2015 ABIs: R 1.03, L 0.97.   Restless leg    Sleep apnea    Stroke [facility])    TIA (transient ischemic attack)    Tobacco abuse    Urge incontinence    Past Surgical History:  Procedure  Laterality Date   CYST EXCISION Left 01/2021   left foot   EYE SURGERY     at age 79   hysterectomy -age 58     ILIAC ARTERY STENT     RIGHT ILIAC STENT   KNEE ARTHROSCOPY     LEFT HEART CATH AND CORONARY ANGIOGRAPHY N/A 03/05/2022   Procedure: LEFT HEART CATH AND CORONARY ANGIOGRAPHY;  Surgeon: Corky Crafts, MD;  Location: [facility] INVASIVE [facility];  Service: Cardiovascular;  Laterality: N/A;   LITHOTRIPSY Left 12/2020   LUMBAR LAMINECTOMY     TUBAL LIGATION      Family History  Problem Relation Age of Onset   Alzheimer's disease Mother    Hodgkin's lymphoma Brother    Lung cancer Brother    Hepatitis C Brother    Hypothyroidism Brother    Hypertension Brother    Depression Brother    Heart disease Other        Grandfather   Social History   Socioeconomic History   Marital status: Divorced    Spouse name: Not on file   Number of children: 3   Years of education: 10 th   Highest education level: Not on file  Occupational History   Occupation: DISABLED    Employer: UNEMPLOYED  Tobacco Use   Smoking status: Every Day    Current packs/day: 0.00    Average packs/day: 2.0 packs/day for 54.0 years (108.0 ttl pk-yrs)    Types: Cigarettes    Start date: 11/20/1958    Last attempt to quit: 11/20/2012    Years since quitting: 10.5   Smokeless tobacco: Never   Tobacco comments:    2 ppd for  many years.     04/13/2022 Patient smokes 1/2 pack daily or more if she is nervous  Substance and Sexual Activity   Alcohol use: No    Alcohol/week: 0.0 standard drinks of alcohol   Drug use: No   Sexual activity: Never  Other Topics Concern   Not on file  Social History Narrative   Patient is single with 3 children, 1 deceased.   Patient is right handed.   Patient has 10 th grade education.   Patient drinks 5 or more cups daily.   Social Determinants of Health   Financial Resource Strain: Low Risk  (02/27/2023)   Overall Financial Resource Strain (CARDIA)    Difficulty of  Paying Living Expenses: Not hard at all  Food Insecurity: No Food Insecurity (09/14/2022)   Hunger Vital Sign    Worried About Running Out of Food in the Last Year: Never true    Ran Out of Food in the Last Year: Never true  Transportation Needs: No Transportation Needs (02/27/2023)   PRAPARE - Administrator, Civil Service (Medical): No    Lack of Transportation (Non-Medical): No  Physical Activity: Inactive (09/14/2022)   Exercise Vital Sign    Days of Exercise per Week: 0 days    Minutes of Exercise per Session: 0 min  Stress: Stress Concern Present (09/14/2022)   [employer] of Occupational Health - Occupational Stress Questionnaire    Feeling of Stress : Rather much  Social Connections: Moderately Isolated (09/14/2022)   Social Connection and Isolation Panel [NHANES]    Frequency of Communication with Friends and Family: More than three times a week    Frequency of Social Gatherings with Friends and Family: Never    Attends Religious Services: More than 4 times per year    Active Member of [employer] or Organizations: No    Attends Banker Meetings: Never    Marital Status: Divorced    Objective:  There were no vitals taken for this visit.     05/07/2023    3:36 PM 04/29/2023   11:36 AM 03/08/2023    2:20 PM  BP/Weight  Systolic BP 132 130 140  Diastolic BP 68 58 64  Wt. (Lbs) 159 161 164.6  BMI 26.46 kg/m2 26.79 kg/m2 27.39 kg/m2    Physical Exam  Diabetic Foot Exam - Simple   No data filed      Lab Results  Component Value Date   WBC 7.9 04/26/2023   HGB 13.6 04/26/2023   HCT 41.6 04/26/2023   PLT 249 04/26/2023   GLUCOSE 120 (H) 04/26/2023   CHOL 112 04/26/2023   TRIG 108 04/26/2023   HDL 47 04/26/2023   LDLCALC 45 04/26/2023   ALT 22 04/26/2023   AST 25 04/26/2023   NA 145 (H) 04/26/2023   K 4.5 04/26/2023   CL 105 04/26/2023   CREATININE 0.74 04/26/2023   BUN 19 04/26/2023   CO2 23 04/26/2023   TSH 0.734 04/26/2023   INR  1.0 01/31/2019   HGBA1C 7.4 (H) 04/26/2023   MICROALBUR neg 02/27/2021      Assessment & Plan:    Age-related osteoporosis without current pathological fracture -     Romosozumab-aqqg  Need for influenza vaccination -     Flu Vaccine Trivalent High Dose (Fluad)  Dysuria -     POCT URINALYSIS DIP (CLINITEK)     Meds ordered this encounter  Medications   Romosozumab-aqqg (EVENITY) 105 MG/1. injection 210 mg  Restricted to Outpatient Use.  Do Not Tube.    Orders Placed This Encounter  Procedures   Flu Vaccine Trivalent High Dose (Fluad)   POCT URINALYSIS DIP (CLINITEK)     Follow-up: No follow-ups on file.   I,Carolyn M Morrison,acting as a Neurosurgeon for Blane Ohara, MD.,have documented all relevant documentation on the behalf of Blane Ohara, MD,as directed by  Blane Ohara, MD while in the presence of Blane Ohara, MD.   Clayborn Bigness I Leal-Borjas,acting as a scribe for Blane Ohara, MD.,have documented all relevant documentation on the behalf of Blane Ohara, MD,as directed by  Blane Ohara, MD while in the presence of Blane Ohara, MD.    An After Visit Summary was printed and given to the patient.  Blane Ohara, MD [facility] [phone]

## 2023-06-15 DIAGNOSIS — M810 Age-related osteoporosis without current pathological fracture: Secondary | ICD-10-CM | POA: Insufficient documentation

## 2023-06-15 NOTE — Assessment & Plan Note (Signed)
Improved!  Continue ozempic of 2 mg weekly.

## 2023-06-15 NOTE — Assessment & Plan Note (Signed)
Not at goal  Increase valsartan 320 mg once daily.

## 2023-06-15 NOTE — Assessment & Plan Note (Signed)
The current medical regimen is effective;  continue present plan and medications.  Evenity.

## 2023-06-15 NOTE — Assessment & Plan Note (Signed)
Check UA

## 2023-06-16 ENCOUNTER — Encounter: Payer: Self-pay | Admitting: Family Medicine

## 2023-06-19 ENCOUNTER — Other Ambulatory Visit: Payer: Self-pay

## 2023-06-19 DIAGNOSIS — I110 Hypertensive heart disease with heart failure: Secondary | ICD-10-CM

## 2023-06-20 ENCOUNTER — Other Ambulatory Visit: Payer: Self-pay

## 2023-06-24 ENCOUNTER — Ambulatory Visit: Payer: Medicare Other

## 2023-06-24 VITALS — BP 182/78

## 2023-06-24 DIAGNOSIS — I110 Hypertensive heart disease with heart failure: Secondary | ICD-10-CM

## 2023-06-24 MED ORDER — AMLODIPINE BESYLATE 5 MG PO TABS: 5.0000 mg | ORAL_TABLET | Freq: Every day | ORAL | 0 refills | Status: DC

## 2023-06-25 NOTE — Progress Notes (Signed)
Patient's bp still elevated. Per Dr. Sedalia Muta patient was sent in amlodipine 5 mg 1 tablet daily and to continue Valsartan 320 mg 1 tablet daily and patient has scheduled follow up for 2 weeks with provider to follow up BP.

## 2023-06-26 ENCOUNTER — Other Ambulatory Visit: Payer: Self-pay | Admitting: Family Medicine

## 2023-06-26 DIAGNOSIS — I5042 Chronic combined systolic (congestive) and diastolic (congestive) heart failure: Secondary | ICD-10-CM

## 2023-06-26 DIAGNOSIS — E1140 Type 2 diabetes mellitus with diabetic neuropathy, unspecified: Secondary | ICD-10-CM

## 2023-06-26 DIAGNOSIS — F332 Major depressive disorder, recurrent severe without psychotic features: Secondary | ICD-10-CM

## 2023-07-01 ENCOUNTER — Ambulatory Visit: Payer: Medicare Other | Attending: Neurology

## 2023-07-01 DIAGNOSIS — G4710 Hypersomnia, unspecified: Secondary | ICD-10-CM | POA: Diagnosis present

## 2023-07-01 DIAGNOSIS — I6789 Other cerebrovascular disease: Secondary | ICD-10-CM | POA: Insufficient documentation

## 2023-07-01 DIAGNOSIS — I679 Cerebrovascular disease, unspecified: Secondary | ICD-10-CM | POA: Insufficient documentation

## 2023-07-05 ENCOUNTER — Ambulatory Visit (INDEPENDENT_AMBULATORY_CARE_PROVIDER_SITE_OTHER): Payer: Medicare Other | Admitting: Family Medicine

## 2023-07-05 ENCOUNTER — Other Ambulatory Visit: Payer: Self-pay | Admitting: Family Medicine

## 2023-07-05 VITALS — BP 136/70 | HR 82 | Temp 96.8°F | Ht 65.0 in | Wt 157.0 lb

## 2023-07-05 DIAGNOSIS — R300 Dysuria: Secondary | ICD-10-CM | POA: Diagnosis not present

## 2023-07-05 DIAGNOSIS — I5042 Chronic combined systolic (congestive) and diastolic (congestive) heart failure: Secondary | ICD-10-CM

## 2023-07-05 DIAGNOSIS — I110 Hypertensive heart disease with heart failure: Secondary | ICD-10-CM

## 2023-07-05 MED ORDER — CHLORTHALIDONE 25 MG PO TABS: 25.0000 mg | ORAL_TABLET | Freq: Every day | ORAL | 1 refills | Status: DC

## 2023-07-05 NOTE — Progress Notes (Signed)
Acute Office Visit  Subjective:    Patient ID: Sonya Small, female    DOB: 10-Jan-1951, 72 y.o.   MRN: [id number]  Chief Complaint  Patient presents with   Dysuria    HPI: Patient is in today for dysuria, no nausea or chills, discomfort with urination. Has chronic back pain but denies abdominal pain.  Bp164/79 with her cuff. Repeated and the same. On valsartan 320 mg daily, torsemide 20 mg 2 tablets daily,and amlodipine 5 mg daily. Asymptomatic.    Past Medical History:  Diagnosis Date   Anxiety    CAD (coronary artery disease)    a. NSTEMI 11/2008 s/p DES to LCx (3.0x12 Xience); b. NSTEMI 01/2010 secondary to thrombotic RCA lesion (non-obstructive)-->med rx (integrilin x 24 hrs + plavix); c. 09/2012 negative Myoview.   Chronic diastolic CHF (congestive heart failure) (HCC)    a. 06/2014 Echo: EF 55-60%, no rwma, Gr1 DD, mild AI.   Depression    Dizziness    Drug induced constipation    Ganglion cyst of left foot 01/17/2021   Generalized hyperhidrosis    GERD (gastroesophageal reflux disease)    Headache    Hyperlipidemia    Hypertensive heart disease    Lumbar disc disease    Metabolic encephalopathy    Mixed hyperlipidemia    Myocardial infarction (HCC)    Obstructive sleep apnea    OP (osteoporosis)    Osteoarthritis    Osteoporosis    Other malaise    Overweight(278.02)    PAD (peripheral artery disease) (HCC)    a. Emboli to R foot 2010 from partially occlusive lesion in R EIA, s/p stenting. - followed by Dr. Arbie Cookey;  b. 10/2015 ABIs: R 1.03, L 0.97.   Restless leg    Sleep apnea    Stroke [facility])    TIA (transient ischemic attack)    Tobacco abuse    Urge incontinence     Past Surgical History:  Procedure Laterality Date   CYST EXCISION Left 01/2021   left foot   EYE SURGERY     at age 29   hysterectomy -age 21     ILIAC ARTERY STENT     RIGHT ILIAC STENT   KNEE ARTHROSCOPY     LEFT HEART CATH AND CORONARY ANGIOGRAPHY N/A 03/05/2022   Procedure: LEFT  HEART CATH AND CORONARY ANGIOGRAPHY;  Surgeon: Corky Crafts, MD;  Location: [facility];  Service: Cardiovascular;  Laterality: N/A;   LITHOTRIPSY Left 12/2020   LUMBAR LAMINECTOMY     TUBAL LIGATION      Family History  Problem Relation Age of Onset   Alzheimer's disease Mother    Hodgkin's lymphoma Brother    Lung cancer Brother    Hepatitis C Brother    Hypothyroidism Brother    Hypertension Brother    Depression Brother    Heart disease Other        Grandfather    Social History   Socioeconomic History   Marital status: Divorced    Spouse name: Not on file   Number of children: 3   Years of education: 10 th   Highest education level: Not on file  Occupational History   Occupation: DISABLED    Employer: UNEMPLOYED  Tobacco Use   Smoking status: Every Day    Current packs/day: 0.00    Average packs/day: 2.0 packs/day for 54.0 years (108.0 ttl pk-yrs)    Types: Cigarettes    Start date: 11/20/1958    Last  attempt to quit: 11/20/2012    Years since quitting: 10.6   Smokeless tobacco: Never   Tobacco comments:    2 ppd for many years.     04/13/2022 Patient smokes 1/2 pack daily or more if she is nervous  Substance and Sexual Activity   Alcohol use: No    Alcohol/week: 0.0 standard drinks of alcohol   Drug use: No   Sexual activity: Never  Other Topics Concern   Not on file  Social History Narrative   Patient is single with 3 children, 1 deceased.   Patient is right handed.   Patient has 10 th grade education.   Patient drinks 5 or more cups daily.   Social Determinants of Health   Financial Resource Strain: Low Risk  (02/27/2023)   Overall Financial Resource Strain (CARDIA)    Difficulty of Paying Living Expenses: Not hard at all  Food Insecurity: No Food Insecurity (09/14/2022)   Hunger Vital Sign    Worried About Running Out of Food in the Last Year: Never true    Ran Out of Food in the Last Year: Never true  Transportation Needs: No  Transportation Needs (02/27/2023)   PRAPARE - Administrator, Civil Service (Medical): No    Lack of Transportation (Non-Medical): No  Physical Activity: Inactive (09/14/2022)   Exercise Vital Sign    Days of Exercise per Week: 0 days    Minutes of Exercise per Session: 0 min  Stress: Stress Concern Present (09/14/2022)   [employer] of Occupational Health - Occupational Stress Questionnaire    Feeling of Stress : Rather much  Social Connections: Moderately Isolated (09/14/2022)   Social Connection and Isolation Panel [NHANES]    Frequency of Communication with Friends and Family: More than three times a week    Frequency of Social Gatherings with Friends and Family: Never    Attends Religious Services: More than 4 times per year    Active Member of [employer] or Organizations: No    Attends Banker Meetings: Never    Marital Status: Divorced  Catering manager Violence: Not At Risk (09/14/2022)   Humiliation, Afraid, Rape, and Kick questionnaire    Fear of Current or Ex-Partner: No    Emotionally Abused: No    Physically Abused: No    Sexually Abused: No    Outpatient Medications Prior to Visit  Medication Sig Dispense Refill   albuterol (PROVENTIL) (2.5 MG/3ML) 0.083% nebulizer solution Take 3 mLs (2.5 mg total) by nebulization every 6 (six) hours as needed for wheezing or shortness of breath. (Patient not taking: Reported on 05/07/2023) 75 mL 2   albuterol (VENTOLIN HFA) 108 (90 Base) MCG/ACT inhaler Inhale 2 puffs into the lungs every 4 (four) hours as needed for wheezing or shortness of breath.     amLODipine (NORVASC) 5 MG tablet Take 1 tablet (5 mg total) by mouth daily. 90 tablet 0   Aspirin-Acetaminophen (GOODYS BODY PAIN PO) Take 1 packet by mouth daily.     baclofen (LIORESAL) 10 MG tablet Take 10 mg by mouth 3 (three) times daily as needed.     blood glucose meter kit and supplies KIT Dispense based on patient and insurance preference. Use up to four  times daily as directed. 1 each 0   buPROPion (WELLBUTRIN XL) 150 MG 24 hr tablet TAKE 1 TABLET BY MOUTH ONCE DAILY 30 tablet 2   clopidogrel (PLAVIX) 75 MG tablet Take 1 tablet (75 mg total) by mouth every morning.  90 tablet 1   clotrimazole (MYCELEX) 10 MG troche TAKE ONE TABLET BY MOUTH five times daily 25 Troche 1   docusate sodium (COLACE) 100 MG capsule Take 100 mg by mouth 2 (two) times daily as needed for mild constipation.     ezetimibe (ZETIA) 10 MG tablet Take 1 tablet (10 mg total) by mouth daily. 90 tablet 1   fenofibrate 160 MG tablet TAKE ONE TABLET BY MOUTH EVERY MORNING 90 tablet 1   fluticasone (FLONASE) 50 MCG/ACT nasal spray INSTILL 1 SPRAY IN EACH NOSTRIL ONCE DAILY (Patient taking differently: Place 1 spray into both nostrils daily as needed for rhinitis.) 16 g 1   glucose blood (ONETOUCH ULTRA) test strip DISPENSE BASED ON PATIENT AND INSURANCE PREFERENCE. USE UP TO FOUR TIMES DAILY AS DIRECTED. 400 strip 3   guaiFENesin (MUCINEX) 600 MG 12 hr tablet Take 1 tablet (600 mg total) by mouth 2 (two) times daily. (Patient not taking: Reported on 05/07/2023) 60 tablet 5   lamoTRIgine (LAMICTAL) 25 MG tablet TAKE TWO TABLETS BY MOUTH EVERYDAY AT BEDTIME 180 tablet 0   lansoprazole (PREVACID) 30 MG capsule TAKE ONE CAPSULE BY MOUTH BEFORE BREAKFAST 90 capsule 3   Levomilnacipran HCl ER (FETZIMA) 40 MG CP24 Take 40 mg by mouth daily. (Patient not taking: Reported on 05/07/2023) 90 capsule 0   montelukast (SINGULAIR) 10 MG tablet Take 10 mg by mouth daily.     naloxone (NARCAN) nasal spray 4 mg/0.1 mL SMARTSIG:1 Both Nares Daily (Patient not taking: Reported on 05/07/2023)     nitroGLYCERIN (NITROSTAT) 0.4 MG SL tablet Dissolve 1 tab under tongue as needed for chest pain. May repeat every 5 minutes x 2 doses. If no relief call 9-1-1. 25 tablet 2   oxyCODONE-acetaminophen (PERCOCET) 10-325 MG tablet Take 1 tablet by mouth every 6 (six) hours as needed for pain.     rosuvastatin (CRESTOR) 20  MG tablet Take 1 tablet (20 mg total) by mouth daily. 90 tablet 1   Semaglutide, 2 MG/DOSE, (OZEMPIC, 2 MG/DOSE,) 8 MG/3ML SOPN INJECT 2 MG SUBCUTANEOUSLY ONCE WEEKLY AS DIRECTED 3 mL 2   torsemide (DEMADEX) 20 MG tablet TAKE 2 TABLETS BY MOUTH ONCE DAILY 60 tablet 2   TRELEGY ELLIPTA 200-62.5-25 MCG/ACT AEPB Inhale 1 puff into the lungs daily.     TRUEplus Lancets 30G MISC 1 each by Does not apply route 2 (two) times daily. E11.69 400 each 3   valACYclovir (VALTREX) 1000 MG tablet TAKE TWO TABLETS BY MOUTH twice daily AS NEEDED 20 tablet 2   VASCEPA 1 g capsule TAKE TWO CAPSULES BY MOUTH TWICE DAILY 360 capsule 2   vitamin B-12 (CYANOCOBALAMIN) 50 MCG tablet Take 50 mcg by mouth daily.     Vitamin D, Ergocalciferol, (DRISDOL) 1.25 MG (50000 UNIT) CAPS capsule Take 1 capsule (50,000 Units total) by mouth every 7 (seven) days. 14 capsule 0   ALPRAZolam (XANAX) 0.5 MG tablet TAKE ONE TABLET BY MOUTH three times daily (Patient taking differently: Take 0.25 mg by mouth 3 (three) times daily.) 90 tablet 1   gabapentin (NEURONTIN) 300 MG capsule Take 1 capsule (300 mg total) by mouth 2 (two) times daily. TAKE ONE CAPSULE BY MOUTH EVERYDAY AT BEDTIME 180 capsule 1   MYRBETRIQ 50 MG TB24 tablet TAKE ONE TABLET BY MOUTH ONCE DAILY 30 tablet 2   valsartan (DIOVAN) 320 MG tablet Take 1 tablet (320 mg total) by mouth daily. 30 tablet 0   No facility-administered medications prior to visit.  Allergies  Allergen Reactions   Abilify [Aripiprazole]     confusion   Latex Rash    Review of Systems  Constitutional:  Negative for chills and fever.  Respiratory:  Negative for cough and shortness of breath.   Gastrointestinal:  Negative for abdominal pain and nausea.  Genitourinary:  Positive for dysuria. Negative for flank pain, frequency and urgency.  Musculoskeletal:  Positive for back pain.       Objective:        07/05/2023    9:26 AM 06/24/2023   12:20 PM 06/24/2023   12:08 PM  Vitals with  BMI  Height 5\' 5"     Weight 157 lbs    BMI 26.13    Systolic 136 148 409  Diastolic 70 78 78  Pulse 82      No data found.   Physical Exam Vitals reviewed. Exam conducted with a chaperone present.  Constitutional:      Appearance: Normal appearance. She is normal weight.  Neck:     Vascular: No carotid bruit.  Cardiovascular:     Rate and Rhythm: Normal rate and regular rhythm.     Heart sounds: Normal heart sounds.  Pulmonary:     Effort: Pulmonary effort is normal. No respiratory distress.     Breath sounds: Normal breath sounds.  Abdominal:     General: Abdomen is flat. Bowel sounds are normal.     Palpations: Abdomen is soft.     Tenderness: There is no abdominal tenderness.  Genitourinary:    General: Normal vulva.     Exam position: Lithotomy position.     Pubic Area: No rash.      Vagina: Normal.     Cervix: Normal.  Neurological:     Mental Status: She is alert and oriented to person, place, and time.  Psychiatric:        Mood and Affect: Mood normal.        Behavior: Behavior normal.     Health Maintenance Due  Topic Date Due   DTaP/Tdap/Td (1 - Tdap) Never done   Colonoscopy  Never done   Zoster Vaccines- Shingrix (1 of 2) Never done   Lung Cancer Screening  03/02/2022   COVID-19 Vaccine (3 - 2023-24 season) 05/26/2023    There are no preventive care reminders to display for this patient.   Lab Results  Component Value Date   TSH 0.734 04/26/2023   Lab Results  Component Value Date   WBC 7.9 04/26/2023   HGB 13.6 04/26/2023   HCT 41.6 04/26/2023   MCV 88 04/26/2023   PLT 249 04/26/2023   Lab Results  Component Value Date   NA 145 (H) 04/26/2023   K 4.5 04/26/2023   CO2 23 04/26/2023   GLUCOSE 120 (H) 04/26/2023   BUN 19 04/26/2023   CREATININE 0.74 04/26/2023   BILITOT 0.2 04/26/2023   ALKPHOS 103 04/26/2023   AST 25 04/26/2023   ALT 22 04/26/2023   PROT 6.9 04/26/2023   ALBUMIN 4.3 04/26/2023   CALCIUM 9.3 04/26/2023    ANIONGAP 10 10/19/2020   EGFR 85 04/26/2023   Lab Results  Component Value Date   CHOL 112 04/26/2023   Lab Results  Component Value Date   HDL 47 04/26/2023   Lab Results  Component Value Date   LDLCALC 45 04/26/2023   Lab Results  Component Value Date   TRIG 108 04/26/2023   Lab Results  Component Value Date   CHOLHDL 2.4 04/26/2023  Lab Results  Component Value Date   HGBA1C 7.4 (H) 04/26/2023       Assessment & Plan:  Dysuria Assessment & Plan: Check UA normal.  No evidence of atrophic vaginitis or vaginal infection.  Recommended push fluids.  Orders: -     POCT URINALYSIS DIP (CLINITEK)  Hypertensive heart disease with chronic combined systolic and diastolic congestive heart failure (HCC) Assessment & Plan: Not at goal. On valsartan 320 mg daily, torsemide 20 mg 2 tablets daily,and amlodipine 5 mg daily.  Start chlorthalidone 25 mg daily.       Meds ordered this encounter  Medications   DISCONTD: chlorthalidone (HYGROTON) 25 MG tablet    Sig: Take 1 tablet (25 mg total) by mouth daily.    Dispense:  30 tablet    Refill:  1    Orders Placed This Encounter  Procedures   POCT URINALYSIS DIP (CLINITEK)     Follow-up: Return in about 24 days (around 07/29/2023) for chronic fasting.  An After Visit Summary was printed and given to the patient.   Clayborn Bigness I Leal-Borjas,acting as a scribe for Blane Ohara, MD.,have documented all relevant documentation on the behalf of Blane Ohara, MD,as directed by  Blane Ohara, MD while in the presence of Blane Ohara, MD.   Blane Ohara, MD [facility] [phone]

## 2023-07-05 NOTE — Patient Instructions (Addendum)
Start chlorthalidone 25 mg daily.  

## 2023-07-06 ENCOUNTER — Other Ambulatory Visit: Payer: Self-pay | Admitting: Family Medicine

## 2023-07-06 DIAGNOSIS — I110 Hypertensive heart disease with heart failure: Secondary | ICD-10-CM

## 2023-07-06 NOTE — Assessment & Plan Note (Addendum)
Not at goal. On valsartan 320 mg daily, torsemide 20 mg 2 tablets daily,and amlodipine 5 mg daily.  Start chlorthalidone 25 mg daily.

## 2023-07-06 NOTE — Assessment & Plan Note (Addendum)
Check UA normal.  No evidence of atrophic vaginitis or vaginal infection.  Recommended push fluids.

## 2023-07-08 ENCOUNTER — Ambulatory Visit: Payer: Medicare Other

## 2023-07-08 ENCOUNTER — Other Ambulatory Visit: Payer: Self-pay | Admitting: Family Medicine

## 2023-07-08 DIAGNOSIS — N3946 Mixed incontinence: Secondary | ICD-10-CM

## 2023-07-08 DIAGNOSIS — R5383 Other fatigue: Secondary | ICD-10-CM

## 2023-07-10 NOTE — Progress Notes (Signed)
Cardiology Office Note    Date:  07/17/2023   ID:  Sonya Small Dec 24, 1950, MRN [id number]  PCP:  Blane Ohara, MD  Cardiologist:  Dr. [name]  No chief complaint on file.   History of Present Illness:  Sonya Small is a 72 y.o. female with PMH of HTN, HLD, OSA, PAD, CVA, chronic diastolic heart failure and CAD.  Patient had a NSTEMI in March 2010 and underwent DES to left circumflex.  She had another NSTEMI in May 2011 secondary to thrombotic RCA lesion, cardiac catheterization showed nonobstructive disease, medical therapy was recommended.  She was admitted for recurrent chest pain in January 2014, Myoview was negative.  She was admitted for acute CVA in October 2015 after presenting with slurred speech and facial drooping.  MRI showed acute right MCA infarct involving basal ganglia and periventricular white matter.  She was started on aspirin along with Plavix.  Myoview obtained in August 2017 showed no ischemia, normal EF.   ABI obtained in July 2018 was normal.    She was seen by Sonya Course PA-C on 03/13/2018  with chest pain.  A Myoview study on 03/25/2018 which showed EF 52%, small sized moderate intensity fixed apical perfusion defect likely attenuation artifact was seen, no reversible ischemia otherwise.  Overall this is a low risk stress test.  Seen again on 05/22/2018, he was having a lot of dizziness. She was noted to be taking different doses of medication than what was prescribed. BP was stable. Polypharmacy with use of Xanax, oxycodone, Tizanidiine, Zoloft. Dizziness felt to be more related to medication than to a vascular issue. Carotid dopplers were checked and were OK.  She was admitted in May 2020 with PNA and respiratory failure requiring ventilator support. Treated with antibiotics and breathing treatments with improvement. Still smoking.  She was seen in the ED on 10/19/20 with acute mid thoracic pain. Started left scapula and moved to the right. She states Ntg did help.  Troponin was normal x 2. Ecg showed mild ST depression in leads V5-6. CXR normal. Other labs normal. Pain lasted about 30 minutes.  Since then her pain has resolved.    When seen last year she noted that she has had pain across her shoulder blades and upper back for several months. It is worse with activity. She does get relief with pain medication and Ntg.  We repeated a Myoview study and it was low risk. Unchanged from prior. Continued on medical therapy.  When seen in June she ws having worsening chest pain. She has been under a lot of stress. She was caring for her brother and he died this past month with lung CA. She has complained of chest pain since then. Worse when stressed. Some relief with sl Ntg but she doesn't like to take. She notes pain across her anterior chest and into her back. Doesn't feel well. Has trouble staying awake. Fingers are numb. Notes toes are blue.   She underwent cardiac cath in October 2023 which showed no significant obstructive CAD. She also had follow up LE arterial vascular dopplers which were stable with good ABIs.   She was seen by Sonya Person NP on 12/28/22. Noted BP was low. Lisinopril HCT was cut in half. Later when seen by Dr Sedalia Muta this was discontinued. Now on torsemide 10 mg daily for leg swelling. Wearing compression hose. Notes she is sleeping all the time. Has seen pulmonary in . Had sleep study per her report and they recommended  CPAP which she could not tolerate. They are now recommending oxygen at night now. She is still smoking.   Was seen in June for complaints of swelling. BNP was normal. Echo showed good cardiac function.  Seen by Dr Sedalia Muta earlier this month. Chlorthalidone 25 mg added for BP control. Patient reports she is taking amlodipine 5 mg daily and valsartan 160 mg daily. Notes she doesn't feel well today. Is dizzy and weak. BP low.    Past Medical History:  Diagnosis Date   Anxiety    CAD (coronary artery disease)    a. NSTEMI 11/2008  s/p DES to LCx (3.0x12 Xience); b. NSTEMI 01/2010 secondary to thrombotic RCA lesion (non-obstructive)-->med rx (integrilin x 24 hrs + plavix); c. 09/2012 negative Myoview.   Chronic diastolic CHF (congestive heart failure) (HCC)    a. 06/2014 Echo: EF 55-60%, no rwma, Gr1 DD, mild AI.   Depression    Dizziness    Drug induced constipation    Ganglion cyst of left foot 01/17/2021   Generalized hyperhidrosis    GERD (gastroesophageal reflux disease)    Headache    Hyperlipidemia    Hypertensive heart disease    Lumbar disc disease    Metabolic encephalopathy    Mixed hyperlipidemia    Myocardial infarction (HCC)    Obstructive sleep apnea    OP (osteoporosis)    Osteoarthritis    Osteoporosis    Other malaise    Overweight(278.02)    PAD (peripheral artery disease) (HCC)    a. Emboli to R foot 2010 from partially occlusive lesion in R EIA, s/p stenting. - followed by Dr. Arbie Cookey;  b. 10/2015 ABIs: R 1.03, L 0.97.   Restless leg    Sleep apnea    Stroke [facility])    TIA (transient ischemic attack)    Tobacco abuse    Urge incontinence     Past Surgical History:  Procedure Laterality Date   CYST EXCISION Left 01/2021   left foot   EYE SURGERY     at age 34   hysterectomy -age 48     ILIAC ARTERY STENT     RIGHT ILIAC STENT   KNEE ARTHROSCOPY     LEFT HEART CATH AND CORONARY ANGIOGRAPHY N/A 03/05/2022   Procedure: LEFT HEART CATH AND CORONARY ANGIOGRAPHY;  Surgeon: Corky Crafts, MD;  Location: [facility] INV[facility];  Service: Cardiovascular;  Laterality: N/A;   LITHOTRIPSY Left 12/2020   LUMBAR LAMINECTOMY     TUBAL LIGATION      Current Medications: Outpatient Medications Prior to Visit  Medication Sig Dispense Refill   albuterol (PROVENTIL) (2.5 MG/3ML) 0.083% nebulizer solution Take 3 mLs (2.5 mg total) by nebulization every 6 (six) hours as needed for wheezing or shortness of breath. 75 mL 2   albuterol (VENTOLIN HFA) 108 (90 Base) MCG/ACT inhaler Inhale 2 puffs into  the lungs every 4 (four) hours as needed for wheezing or shortness of breath.     ALPRAZolam (XANAX) 0.5 MG tablet TAKE 1 TABLET BY MOUTH 3 TIMES DAILY 90 tablet 0   amLODipine (NORVASC) 5 MG tablet Take 1 tablet (5 mg total) by mouth daily. 90 tablet 0   Aspirin-Acetaminophen (GOODYS BODY PAIN PO) Take 1 packet by mouth daily.     baclofen (LIORESAL) 10 MG tablet Take 10 mg by mouth 3 (three) times daily as needed.     blood glucose meter kit and supplies KIT Dispense based on patient and insurance preference. Use up to four  times daily as directed. 1 each 0   buPROPion (WELLBUTRIN XL) 150 MG 24 hr tablet TAKE 1 TABLET BY MOUTH ONCE DAILY 30 tablet 2   clopidogrel (PLAVIX) 75 MG tablet Take 1 tablet (75 mg total) by mouth every morning. 90 tablet 1   clotrimazole (MYCELEX) 10 MG troche TAKE ONE TABLET BY MOUTH five times daily 25 Troche 1   docusate sodium (COLACE) 100 MG capsule Take 100 mg by mouth 2 (two) times daily as needed for mild constipation.     ezetimibe (ZETIA) 10 MG tablet Take 1 tablet (10 mg total) by mouth daily. 90 tablet 1   fenofibrate 160 MG tablet TAKE ONE TABLET BY MOUTH EVERY MORNING 90 tablet 1   fluticasone (FLONASE) 50 MCG/ACT nasal spray INSTILL 1 SPRAY IN EACH NOSTRIL ONCE DAILY (Patient taking differently: Place 1 spray into both nostrils daily as needed for rhinitis.) 16 g 1   gabapentin (NEURONTIN) 300 MG capsule TAKE 1 CAPSULE BY MOUTH EVERY MORNING AND TAKE 1 CAPSULE EVERY DAY AT BEDTIME 60 capsule 10   glucose blood (ONETOUCH ULTRA) test strip DISPENSE BASED ON PATIENT AND INSURANCE PREFERENCE. USE UP TO FOUR TIMES DAILY AS DIRECTED. 400 strip 3   guaiFENesin (MUCINEX) 600 MG 12 hr tablet Take 1 tablet (600 mg total) by mouth 2 (two) times daily. 60 tablet 5   lamoTRIgine (LAMICTAL) 25 MG tablet TAKE TWO TABLETS BY MOUTH EVERYDAY AT BEDTIME 180 tablet 0   lansoprazole (PREVACID) 30 MG capsule TAKE ONE CAPSULE BY MOUTH BEFORE BREAKFAST 90 capsule 3    Levomilnacipran HCl ER (FETZIMA) 40 MG CP24 Take 40 mg by mouth daily. 90 capsule 0   montelukast (SINGULAIR) 10 MG tablet Take 10 mg by mouth daily.     MYRBETRIQ 50 MG TB24 tablet TAKE 1 TABLET BY MOUTH ONCE DAILY 30 tablet 10   naloxone (NARCAN) nasal spray 4 mg/0.1 mL      nitroGLYCERIN (NITROSTAT) 0.4 MG SL tablet Dissolve 1 tab under tongue as needed for chest pain. May repeat every 5 minutes x 2 doses. If no relief call 9-1-1. 25 tablet 2   oxyCODONE-acetaminophen (PERCOCET) 10-325 MG tablet Take 1 tablet by mouth every 6 (six) hours as needed for pain.     rosuvastatin (CRESTOR) 20 MG tablet Take 1 tablet (20 mg total) by mouth daily. 90 tablet 1   Semaglutide, 2 MG/DOSE, (OZEMPIC, 2 MG/DOSE,) 8 MG/3ML SOPN INJECT 2 MG SUBCUTANEOUSLY ONCE WEEKLY AS DIRECTED 3 mL 2   torsemide (DEMADEX) 20 MG tablet TAKE 2 TABLETS BY MOUTH ONCE DAILY 60 tablet 2   TRELEGY ELLIPTA 200-62.5-25 MCG/ACT AEPB Inhale 1 puff into the lungs daily.     TRUEplus Lancets 30G MISC 1 each by Does not apply route 2 (two) times daily. E11.69 400 each 3   valACYclovir (VALTREX) 1000 MG tablet TAKE TWO TABLETS BY MOUTH twice daily AS NEEDED 20 tablet 2   VASCEPA 1 g capsule TAKE TWO CAPSULES BY MOUTH TWICE DAILY 360 capsule 2   vitamin B-12 (CYANOCOBALAMIN) 50 MCG tablet Take 50 mcg by mouth daily.     Vitamin D, Ergocalciferol, (DRISDOL) 1.25 MG (50000 UNIT) CAPS capsule Take 1 capsule (50,000 Units total) by mouth every 7 (seven) days. 14 capsule 0   AMLODIPINE BESYLATE PO Take 25 mg by mouth daily.     chlorthalidone (HYGROTON) 25 MG tablet Take 1 tablet (25 mg total) by mouth daily. 30 tablet 1   valsartan (DIOVAN) 320 MG tablet TAKE 1  TABLET BY MOUTH EVERY DAY 90 tablet 1   No facility-administered medications prior to visit.     Allergies:   Abilify [aripiprazole] and Latex   Social History   Socioeconomic History   Marital status: Divorced    Spouse name: Not on file   Number of children: 3   Years of  education: 10 th   Highest education level: Not on file  Occupational History   Occupation: DISABLED    Employer: UNEMPLOYED  Tobacco Use   Smoking status: Every Day    Current packs/day: 0.00    Average packs/day: 2.0 packs/day for 54.0 years (108.0 ttl pk-yrs)    Types: Cigarettes    Start date: 11/20/1958    Last attempt to quit: 11/20/2012    Years since quitting: 10.6   Smokeless tobacco: Never   Tobacco comments:    2 ppd for many years.     04/13/2022 Patient smokes 1/2 pack daily or more if she is nervous  Substance and Sexual Activity   Alcohol use: No    Alcohol/week: 0.0 standard drinks of alcohol   Drug use: No   Sexual activity: Never  Other Topics Concern   Not on file  Social History Narrative   Patient is single with 3 children, 1 deceased.   Patient is right handed.   Patient has 10 th grade education.   Patient drinks 5 or more cups daily.   Social Determinants of Health   Financial Resource Strain: Low Risk  (02/27/2023)   Overall Financial Resource Strain (CARDIA)    Difficulty of Paying Living Expenses: Not hard at all  Food Insecurity: No Food Insecurity (09/14/2022)   Hunger Vital Sign    Worried About Running Out of Food in the Last Year: Never true    Ran Out of Food in the Last Year: Never true  Transportation Needs: No Transportation Needs (02/27/2023)   PRAPARE - Administrator, Civil Service (Medical): No    Lack of Transportation (Non-Medical): No  Physical Activity: Inactive (09/14/2022)   Exercise Vital Sign    Days of Exercise per Week: 0 days    Minutes of Exercise per Session: 0 min  Stress: Stress Concern Present (09/14/2022)   [employer] of Occupational Health - Occupational Stress Questionnaire    Feeling of Stress : Rather much  Social Connections: Moderately Isolated (09/14/2022)   Social Connection and Isolation Panel [NHANES]    Frequency of Communication with Friends and Family: More than three times a week     Frequency of Social Gatherings with Friends and Family: Never    Attends Religious Services: More than 4 times per year    Active Member of [employer] or Organizations: No    Attends Engineer, structural: Never    Marital Status: Divorced     Family History:  The patient's family history includes Alzheimer's disease in her mother; Depression in her brother; Heart disease in an other family member; Hepatitis C in her brother; Hodgkin's lymphoma in her brother; Hypertension in her brother; Hypothyroidism in her brother; Lung cancer in her brother.   ROS:   Please see the history of present illness.    ROS All other systems reviewed and are negative.   PHYSICAL EXAM:   VS:  BP (!) 104/56 (BP Location: Right Arm)   Pulse 79   Ht 5\' 5"  (1.651 m)   Wt 159 lb 3.2 oz (72.2 kg)   SpO2 93%   BMI 26.49 kg/m  GEN: Well nourished, well developed, in no acute distress  HEENT: normal  Neck: no JVD, carotid bruits, or masses Cardiac: RRR; no murmurs, rubs, or gallops,no edema. Pedal pulses are palpable but toes are somewhat dusky on plantar side.  Respiratory:  clear to auscultation bilaterally, normal work of breathing GI: soft, nontender, nondistended, + BS MS: no deformity or atrophy  Legs: compression hose in place. Mild edema.  Skin: warm and dry, no rash Neuro:  Alert and Oriented x 3, Strength and sensation are intact Psych: euthymic mood, full affect  Wt Readings from Last 3 Encounters:  07/17/23 159 lb 3.2 oz (72.2 kg)  07/05/23 157 lb (71.2 kg)  06/10/23 160 lb (72.6 kg)      Studies/Labs Reviewed:   EKG:  EKG is not ordered today    Recent Labs: 02/12/2023: NT-Pro BNP 125 03/08/2023: BNP 25.5 04/26/2023: ALT 22; BUN 19; Creatinine, Ser 0.74; Hemoglobin 13.6; Platelets 249; Potassium 4.5; Sodium 145; TSH 0.734   Lipid Panel    Component Value Date/Time   CHOL 112 04/26/2023 1052   TRIG 108 04/26/2023 1052   HDL 47 04/26/2023 1052   CHOLHDL 2.4 04/26/2023 1052    CHOLHDL 5.0 07/13/2014 0108   VLDL 42 (H) 07/13/2014 0108   LDLCALC 45 04/26/2023 1052   Labs dated 06/17/18: CBC normal except for elevated WBC 13.3.  Creatinine 1.14. Potassium 5.5.  Dated 05/23/18: cholesterol 142, triglycerides 212, HDL 35, LDL 65.  Dated 07/29/18: Normal chemistries and CBC.  Dated 09/02/19: cholesterol 165, triglycerides 146, HDL 49, LDL 62. A1c 7%. Creatinine 0.56. potassium 3.3. CBC and TSH normal.  Additional studies/ records that were reviewed today include:   Myoview 03/25/2018 The left ventricular ejection fraction is mildly decreased (45-54%). Nuclear stress EF: 52%. No T wave inversion was noted during stress. There was no ST segment deviation noted during stress. Defect 1: There is a small defect of moderate severity. This is a low risk study.   Small size, moderate intensity fixed apical perfusion defect, likely attenuation artifact. No reversible ischemia. LVEF 52% with normal wall motion. This is a low risk study.  Myoview 08/22/21: Study Highlights      Findings are consistent with no prior ischemia. The study is low risk.   No ST deviation was noted.   LV perfusion is abnormal. Defect 1: There is a small defect with mild reduction in uptake present in the apical apex location(s) that is fixed. There is normal wall motion in the defect area. Consistent with artifact caused by breast attenuation.   Left ventricular function is normal. End diastolic cavity size is normal. End systolic cavity size is normal.   Prior study available for comparison from 03/25/2018. No changes compared to prior study. LVEF 52%, fixed apical attenuation artifact   Small size, mild intensity fixed apical perfusion defect, likely attenuation artifact. No reversible ischemia. LVEF 52% with normal wall motion. This is a low risk study. Compared to a prior study in 2019, there are no changes (the apical perfusion defect was previously reported) - LVEF is unchanged.   Cardiac cath  03/05/22:   LEFT HEART CATH AND CORONARY ANGIOGRAPHY   Conclusion      1st Mrg lesion is 25% stenosed.   Previously placed Mid Cx stent of unknown type is  widely patent.   The left ventricular systolic function is normal.   LV end diastolic pressure is mildly elevated.   The left ventricular ejection fraction is 45-50% by visual estimate.  Apical  hypokinesis consistent with Takotsubo cardiomyopathy.   There is no aortic valve stenosis.   No significant coronary artery disease.  She does have apical hypokinesis which is consistent with a Takotsubo cardiomyopathy pattern.  She has been under a lot of stress of late.  The apical hypokinesis is minimal so I suspect her Takotsubo is in the process of resolving.  Continue aggressive blood pressure control.  Consider echocardiogram in 6 weeks.   LVEDP was mildly elevated.  Could consider short-term diuretic as well if she has significant shortness of breath.  Coronary Diagrams  Diagnostic Dominance: Right  Intervention  Echo 04/09/23: IMPRESSIONS     1. Left ventricular ejection fraction, by estimation, is 55 to 60%. The  left ventricle has normal function. The left ventricle has no regional  wall motion abnormalities. There is mild asymmetric left ventricular  hypertrophy of the septal segment. Left  ventricular diastolic parameters are consistent with Grade I diastolic  dysfunction (impaired relaxation). The average left ventricular global  longitudinal strain is -14.1 %. The global longitudinal strain is  abnormal.   2. Right ventricular systolic function is normal. The right ventricular  size is normal. There is normal pulmonary artery systolic pressure.   3. The mitral valve is normal in structure. Mild mitral valve  regurgitation. No evidence of mitral stenosis.   4. The aortic valve is normal in structure. Aortic valve regurgitation is  mild. No aortic stenosis is present.   5. Aneurysm of the ascending aorta, measuring 40 mm.    6. The inferior vena cava is normal in size with greater than 50%  respiratory variability, suggesting right atrial pressure of 3 mmHg.   ASSESSMENT:    1. Chest pain of uncertain etiology   2. Hypertensive heart disease with chronic combined systolic and diastolic congestive heart failure (HCC)        PLAN:  In order of problems listed above:  CAD: remote stent of mid LCx in 2010 with 3.0 x 12 DES. In 2011 had NSTEMI related to thrombotic event in distal RCA- nonobstructive. Myoview in November 2022  was normal.  Cardiac cath in June 2023  was unremarkable. Continue aspirin and Plavix.   Hypertension: Blood pressure is low today with recent addition of chlorthalidone. Will reduce dose to 12.5 mg daily. Has repeat lab scheduled in early Nov. She is to let me know if BP remains low.    Carotid artery disease: She has a history of mild carotid artery disease.  LE edema multifactorial with Chronic diastolic heart failure and venous insufficiency. Sodium restriction. Elevation of feet, compression hose.   5.   Dyslipidemia. On Crestor and Vascepa. LDL 82.   6.   Tobacco abuse. Recommend smoking cessation.   7.  PAD. Stable LE vascular studies.   8.  OSA plan nocturnal oxygen. Per pulmonary   Will follow up 6 months.     Medication Adjustments/Labs and Tests Ordered: Current medicines are reviewed at length with the patient today.  Concerns regarding medicines are outlined above.  Medication changes, Labs and Tests ordered today are listed in the Patient Instructions below. Patient Instructions  Medication Instructions:  Decrease Chlorthalidone 25 mg take 1/2 tablet daily Continue all other medications *If you need a refill on your cardiac medications before your next appointment, please call your pharmacy*   Lab Work: None ordered    Testing/Procedures: None ordered  Follow-Up: At [facility], you and your health needs are our priority.  As part of our  continuing mission to provide you with exceptional heart care, we have created designated Provider Care Teams.  These Care Teams include your primary Cardiologist (physician) and Advanced Practice Providers (APPs -  Physician Assistants and Nurse Practitioners) who all work together to provide you with the care you need, when you need it.  We recommend signing up for the patient portal called "MyChart".  Sign up information is provided on this After Visit Summary.  MyChart is used to connect with patients for Virtual Visits (Telemedicine).  Patients are able to view lab/test results, encounter notes, upcoming appointments, etc.  Non-urgent messages can be sent to your provider as well.   To learn more about what you can do with MyChart, go to [email or web address].    Your next appointment:  6 months    Call in Jan to schedule April appointment    Provider:  Dr.Cristalle Rohm         Signed, Dorwin Fitzhenry [name], MD  07/17/2023 5:07 PM    [facility] [address] [place], [place], [place]  [address] Phone: [phone]; Fax: [phone]

## 2023-07-11 ENCOUNTER — Ambulatory Visit: Payer: Medicare Other

## 2023-07-11 DIAGNOSIS — M810 Age-related osteoporosis without current pathological fracture: Secondary | ICD-10-CM

## 2023-07-11 MED ORDER — ROMOSOZUMAB-AQQG 105 MG/1.17ML ~~LOC~~ SOSY
210.0000 mg | PREFILLED_SYRINGE | Freq: Once | SUBCUTANEOUS | Status: AC
  Administered 2023-07-11: 210 mg via SUBCUTANEOUS

## 2023-07-11 NOTE — Progress Notes (Signed)
Patient: Sonya Small  DOB: 01/17/1951  MRN: [id number]    Visit Date: 07/11/2023    Sonya Small presents today for her monthly Evenity injection.  2 x 105 mg/1.17 ml Single-Dose Prefilled Syringes were given SQ, one in each arm.  Patient tolerated the injection well and has no questions.  The next Evenity injections will be due in one month.  Patient also stated she is still having dysuria and not being able to hold her urine, she stated she is barely making it to the bathroom sometimes.      Jomarie Longs, CMA

## 2023-07-13 ENCOUNTER — Encounter: Payer: Self-pay | Admitting: Family Medicine

## 2023-07-13 LAB — POCT URINALYSIS DIP (CLINITEK)
Bilirubin, UA: NEGATIVE
Blood, UA: NEGATIVE
Glucose, UA: NEGATIVE mg/dL
Ketones, POC UA: NEGATIVE mg/dL
Leukocytes, UA: NEGATIVE
Nitrite, UA: NEGATIVE
POC PROTEIN,UA: NEGATIVE
Spec Grav, UA: 1.015 (ref 1.010–1.025)
Urobilinogen, UA: NEGATIVE U/dL — AB
pH, UA: 6 (ref 5.0–8.0)

## 2023-07-15 ENCOUNTER — Telehealth: Payer: Self-pay

## 2023-07-15 DIAGNOSIS — J449 Chronic obstructive pulmonary disease, unspecified: Secondary | ICD-10-CM

## 2023-07-15 NOTE — Telephone Encounter (Signed)
Patient called and stated she has been using her neighbor pull ups but they are too big for her, and wanted to know if there was any way she can get a order for her own and have them delivered at her house. Please advise

## 2023-07-16 NOTE — Telephone Encounter (Signed)
Patient will need a size large and changing them at least 3 times a day.

## 2023-07-17 ENCOUNTER — Ambulatory Visit: Payer: Medicare Other | Attending: Cardiology | Admitting: Cardiology

## 2023-07-17 ENCOUNTER — Encounter: Payer: Self-pay | Admitting: Cardiology

## 2023-07-17 VITALS — BP 104/56 | HR 79 | Ht 65.0 in | Wt 159.2 lb

## 2023-07-17 DIAGNOSIS — I2510 Atherosclerotic heart disease of native coronary artery without angina pectoris: Secondary | ICD-10-CM | POA: Insufficient documentation

## 2023-07-17 DIAGNOSIS — R079 Chest pain, unspecified: Secondary | ICD-10-CM | POA: Diagnosis not present

## 2023-07-17 DIAGNOSIS — E785 Hyperlipidemia, unspecified: Secondary | ICD-10-CM | POA: Insufficient documentation

## 2023-07-17 DIAGNOSIS — I110 Hypertensive heart disease with heart failure: Secondary | ICD-10-CM | POA: Insufficient documentation

## 2023-07-17 DIAGNOSIS — I5042 Chronic combined systolic (congestive) and diastolic (congestive) heart failure: Secondary | ICD-10-CM | POA: Diagnosis present

## 2023-07-17 MED ORDER — VALSARTAN 320 MG PO TABS
160.0000 mg | ORAL_TABLET | Freq: Every day | ORAL | 1 refills | Status: DC
Start: 2023-07-17 — End: 2023-12-22

## 2023-07-17 MED ORDER — CHLORTHALIDONE 25 MG PO TABS: 12.5000 mg | ORAL_TABLET | Freq: Every day | ORAL | 1 refills | Status: DC

## 2023-07-17 NOTE — Patient Instructions (Addendum)
Medication Instructions:  Decrease Chlorthalidone 25 mg take 1/2 tablet daily Continue all other medications *If you need a refill on your cardiac medications before your next appointment, please call your pharmacy*   Lab Work: None ordered    Testing/Procedures: None ordered  Follow-Up: At [facility], you and your health needs are our priority.  As part of our continuing mission to provide you with exceptional heart care, we have created designated Provider Care Teams.  These Care Teams include your primary Cardiologist (physician) and Advanced Practice Providers (APPs -  Physician Assistants and Nurse Practitioners) who all work together to provide you with the care you need, when you need it.  We recommend signing up for the patient portal called "MyChart".  Sign up information is provided on this After Visit Summary.  MyChart is used to connect with patients for Virtual Visits (Telemedicine).  Patients are able to view lab/test results, encounter notes, upcoming appointments, etc.  Non-urgent messages can be sent to your provider as well.   To learn more about what you can do with MyChart, go to [email or web address].    Your next appointment:  6 months    Call in Jan to schedule April appointment    Provider:  Dr.Jordan

## 2023-07-19 NOTE — Telephone Encounter (Signed)
Patient called and wanted to know about if the pull up was order for her and also if she could get a refill on guaifenesin the provider that normally sends I in has retired.  Made patient aware order was sent for pull ups and will send refill to Dr. Sedalia Muta.

## 2023-07-19 NOTE — Addendum Note (Signed)
Addended by: Gwynneth Aliment L on: 07/19/2023 11:10 AM   Modules accepted: Orders

## 2023-07-25 ENCOUNTER — Other Ambulatory Visit: Payer: Self-pay | Admitting: Family Medicine

## 2023-07-25 DIAGNOSIS — R5383 Other fatigue: Secondary | ICD-10-CM

## 2023-07-31 ENCOUNTER — Other Ambulatory Visit: Payer: Self-pay

## 2023-07-31 DIAGNOSIS — E782 Mixed hyperlipidemia: Secondary | ICD-10-CM

## 2023-07-31 DIAGNOSIS — E1140 Type 2 diabetes mellitus with diabetic neuropathy, unspecified: Secondary | ICD-10-CM

## 2023-07-31 DIAGNOSIS — I110 Hypertensive heart disease with heart failure: Secondary | ICD-10-CM

## 2023-07-31 DIAGNOSIS — E559 Vitamin D deficiency, unspecified: Secondary | ICD-10-CM

## 2023-08-01 ENCOUNTER — Other Ambulatory Visit: Payer: Medicare Other

## 2023-08-01 DIAGNOSIS — E559 Vitamin D deficiency, unspecified: Secondary | ICD-10-CM

## 2023-08-01 DIAGNOSIS — E782 Mixed hyperlipidemia: Secondary | ICD-10-CM

## 2023-08-01 DIAGNOSIS — E1140 Type 2 diabetes mellitus with diabetic neuropathy, unspecified: Secondary | ICD-10-CM

## 2023-08-01 DIAGNOSIS — I5042 Chronic combined systolic (congestive) and diastolic (congestive) heart failure: Secondary | ICD-10-CM

## 2023-08-02 ENCOUNTER — Other Ambulatory Visit: Payer: Self-pay | Admitting: Family Medicine

## 2023-08-02 LAB — CBC WITH DIFFERENTIAL/PLATELET
Basophils Absolute: 0.1 10*3/uL (ref 0.0–0.2)
Basos: 1 %
EOS (ABSOLUTE): 0.1 10*3/uL (ref 0.0–0.4)
Eos: 1 %
Hematocrit: 40.5 % (ref 34.0–46.6)
Hemoglobin: 12.7 g/dL (ref 11.1–15.9)
Immature Grans (Abs): 0.1 10*3/uL (ref 0.0–0.1)
Immature Granulocytes: 1 %
Lymphocytes Absolute: 2.6 10*3/uL (ref 0.7–3.1)
Lymphs: 27 %
MCH: 28.3 pg (ref 26.6–33.0)
MCHC: 31.4 g/dL — ABNORMAL LOW (ref 31.5–35.7)
MCV: 90 fL (ref 79–97)
Monocytes Absolute: 0.8 10*3/uL (ref 0.1–0.9)
Monocytes: 9 %
Neutrophils Absolute: 6 10*3/uL (ref 1.4–7.0)
Neutrophils: 61 %
Platelets: 247 10*3/uL (ref 150–450)
RBC: 4.49 x10E6/uL (ref 3.77–5.28)
RDW: 13.2 % (ref 11.7–15.4)
WBC: 9.7 10*3/uL (ref 3.4–10.8)

## 2023-08-02 LAB — LIPID PANEL
Chol/HDL Ratio: 2.8 ratio (ref 0.0–4.4)
Cholesterol, Total: 111 mg/dL (ref 100–199)
HDL: 40 mg/dL (ref >39)
LDL Chol Calc (NIH): 54 mg/dL (ref 0–99)
Triglycerides: 85 mg/dL (ref 0–149)
VLDL Cholesterol Cal: 17 mg/dL (ref 5–40)

## 2023-08-02 LAB — COMPREHENSIVE METABOLIC PANEL
ALT: 17 [IU]/L (ref 0–32)
AST: 18 [IU]/L (ref 0–40)
Albumin: 4 g/dL (ref 3.8–4.8)
Alkaline Phosphatase: 82 [IU]/L (ref 44–121)
BUN/Creatinine Ratio: 35 — ABNORMAL HIGH (ref 12–28)
BUN: 32 mg/dL — ABNORMAL HIGH (ref 8–27)
Bilirubin Total: 0.3 mg/dL (ref 0.0–1.2)
CO2: 26 mmol/L (ref 20–29)
Calcium: 9.4 mg/dL (ref 8.7–10.3)
Chloride: 107 mmol/L — ABNORMAL HIGH (ref 96–106)
Creatinine, Ser: 0.91 mg/dL (ref 0.57–1.00)
Globulin, Total: 2.3 g/dL (ref 1.5–4.5)
Glucose: 125 mg/dL — ABNORMAL HIGH (ref 70–99)
Potassium: 5.2 mmol/L (ref 3.5–5.2)
Sodium: 147 mmol/L — ABNORMAL HIGH (ref 134–144)
Total Protein: 6.3 g/dL (ref 6.0–8.5)
eGFR: 66 mL/min/{1.73_m2} (ref >59)

## 2023-08-02 LAB — HEMOGLOBIN A1C
Est. average glucose Bld gHb Est-mCnc: 169 mg/dL
Hgb A1c MFr Bld: 7.5 % — ABNORMAL HIGH (ref 4.8–5.6)

## 2023-08-02 LAB — VITAMIN D 25 HYDROXY (VIT D DEFICIENCY, FRACTURES): Vit D, 25-Hydroxy: 27.3 ng/mL — ABNORMAL LOW (ref 30.0–100.0)

## 2023-08-04 NOTE — Progress Notes (Unsigned)
Subjective:  Patient ID: Sonya Small, female    DOB: 1950-11-13  Age: 72 y.o. MRN: [id number]  No chief complaint on file.   HPI   She comes in for follow-up of her hypertension.   Diabetes: sugars 123-140 except two readings of 170 and 182, but accounted for by what she ate.       06/10/2023   11:44 AM 06/10/2023   11:04 AM 04/29/2023   11:39 AM 01/22/2023    8:47 AM 09/14/2022    9:02 AM  Depression screen PHQ 2/9  Decreased Interest  0 3 0 3  Down, Depressed, Hopeless  0 3 3 3   PHQ - 2 Score  0 6 3 6   Altered sleeping 2  3 3  0  Tired, decreased energy 3  3 3 3   Change in appetite 0  1 3 0  Feeling bad or failure about yourself  3  3 1  0  Trouble concentrating 3  3 3 1   Moving slowly or fidgety/restless 0  3 3 0  Suicidal thoughts 0   0 0  PHQ-9 Score   22 19 10   Difficult doing work/chores Somewhat difficult  Somewhat difficult Very difficult Somewhat difficult        06/10/2023   11:00 AM  Fall Risk   Falls in the past year? 1  Number falls in past yr: 1  Injury with Fall? 1  Risk for fall due to : History of fall(s)  Follow up Falls evaluation completed;Falls prevention discussed    Patient Care Team: [name], Fritzi Mandes, MD as PCP - General (Family Medicine) [name], [name], MD as PCP - Cardiology (Cardiology) Marcellus Scott, MD as Referring Physician (Specialist)   Review of Systems  Current Outpatient Medications on File Prior to Visit  Medication Sig Dispense Refill   albuterol (PROVENTIL) (2.5 MG/3ML) 0.083% nebulizer solution Take 3 mLs (2.5 mg total) by nebulization every 6 (six) hours as needed for wheezing or shortness of breath. 75 mL 2   albuterol (VENTOLIN HFA) 108 (90 Base) MCG/ACT inhaler Inhale 2 puffs into the lungs every 4 (four) hours as needed for wheezing or shortness of breath.     ALPRAZolam (XANAX) 0.5 MG tablet TAKE 1 TABLET BY MOUTH 3 TIMES DAILY 90 tablet 0   amLODipine (NORVASC) 5 MG tablet Take 1 tablet (5 mg total) by mouth daily. 90  tablet 0   Aspirin-Acetaminophen (GOODYS BODY PAIN PO) Take 1 packet by mouth daily.     baclofen (LIORESAL) 10 MG tablet Take 10 mg by mouth 3 (three) times daily as needed.     blood glucose meter kit and supplies KIT Dispense based on patient and insurance preference. Use up to four times daily as directed. 1 each 0   buPROPion (WELLBUTRIN XL) 150 MG 24 hr tablet TAKE 1 TABLET BY MOUTH ONCE DAILY 30 tablet 2   chlorthalidone (HYGROTON) 25 MG tablet Take 0.5 tablets (12.5 mg total) by mouth daily. 30 tablet 1   clopidogrel (PLAVIX) 75 MG tablet TAKE 1 TABLET BY MOUTH EVERY MORNING 30 tablet 10   clotrimazole (MYCELEX) 10 MG troche TAKE ONE TABLET BY MOUTH five times daily 25 Troche 1   docusate sodium (COLACE) 100 MG capsule Take 100 mg by mouth 2 (two) times daily as needed for mild constipation.     ezetimibe (ZETIA) 10 MG tablet TAKE 1 TABLET BY MOUTH ONCE DAILY 30 tablet 10   fenofibrate 160 MG tablet TAKE 1 TABLET BY MOUTH  EVERY MORNING 30 tablet 10   fluticasone (FLONASE) 50 MCG/ACT nasal spray INSTILL 1 SPRAY IN EACH NOSTRIL ONCE DAILY (Patient taking differently: Place 1 spray into both nostrils daily as needed for rhinitis.) 16 g 1   gabapentin (NEURONTIN) 300 MG capsule TAKE 1 CAPSULE BY MOUTH EVERY MORNING AND TAKE 1 CAPSULE EVERY DAY AT BEDTIME 60 capsule 10   glucose blood (ONETOUCH ULTRA) test strip DISPENSE BASED ON PATIENT AND INSURANCE PREFERENCE. USE UP TO FOUR TIMES DAILY AS DIRECTED. 400 strip 3   guaiFENesin (MUCINEX) 600 MG 12 hr tablet Take 1 tablet (600 mg total) by mouth 2 (two) times daily. 60 tablet 5   lamoTRIgine (LAMICTAL) 25 MG tablet TAKE 2 TABLETS BY MOUTH EVERY DAY AT BEDTIME 60 tablet 10   lansoprazole (PREVACID) 30 MG capsule TAKE ONE CAPSULE BY MOUTH BEFORE BREAKFAST 90 capsule 3   Levomilnacipran HCl ER (FETZIMA) 40 MG CP24 Take 40 mg by mouth daily. 90 capsule 0   montelukast (SINGULAIR) 10 MG tablet Take 10 mg by mouth daily.     MYRBETRIQ 50 MG TB24  tablet TAKE 1 TABLET BY MOUTH ONCE DAILY 30 tablet 10   naloxone (NARCAN) nasal spray 4 mg/0.1 mL      nitroGLYCERIN (NITROSTAT) 0.4 MG SL tablet Dissolve 1 tab under tongue as needed for chest pain. May repeat every 5 minutes x 2 doses. If no relief call 9-1-1. 25 tablet 2   oxyCODONE-acetaminophen (PERCOCET) 10-325 MG tablet Take 1 tablet by mouth every 6 (six) hours as needed for pain.     rosuvastatin (CRESTOR) 20 MG tablet TAKE 1 TABLET BY MOUTH EVERY DAY AT BEDTIME 30 tablet 10   Semaglutide, 2 MG/DOSE, (OZEMPIC, 2 MG/DOSE,) 8 MG/3ML SOPN INJECT 2 MG SUBCUTANEOUSLY ONCE WEEKLY AS DIRECTED 3 mL 2   torsemide (DEMADEX) 20 MG tablet TAKE 2 TABLETS BY MOUTH ONCE DAILY 60 tablet 2   TRELEGY ELLIPTA 200-62.5-25 MCG/ACT AEPB Inhale 1 puff into the lungs daily.     TRUEplus Lancets 30G MISC 1 each by Does not apply route 2 (two) times daily. E11.69 400 each 3   valACYclovir (VALTREX) 1000 MG tablet TAKE 2 TABLETS BY MOUTH TWICE DAILY AS NEEDED 20 tablet 2   valsartan (DIOVAN) 320 MG tablet Take 0.5 tablets (160 mg total) by mouth daily. 90 tablet 1   VASCEPA 1 g capsule TAKE TWO CAPSULES BY MOUTH TWICE DAILY 360 capsule 2   vitamin B-12 (CYANOCOBALAMIN) 50 MCG tablet Take 50 mcg by mouth daily.     Vitamin D, Ergocalciferol, (DRISDOL) 1.25 MG (50000 UNIT) CAPS capsule Take 1 capsule (50,000 Units total) by mouth every 7 (seven) days. 14 capsule 0   No current facility-administered medications on file prior to visit.   Past Medical History:  Diagnosis Date   Anxiety    CAD (coronary artery disease)    a. NSTEMI 11/2008 s/p DES to LCx (3.0x12 Xience); b. NSTEMI 01/2010 secondary to thrombotic RCA lesion (non-obstructive)-->med rx (integrilin x 24 hrs + plavix); c. 09/2012 negative Myoview.   Chronic diastolic CHF (congestive heart failure) (HCC)    a. 06/2014 Echo: EF 55-60%, no rwma, Gr1 DD, mild AI.   Depression    Dizziness    Drug induced constipation    Ganglion cyst of left foot 01/17/2021    Generalized hyperhidrosis    GERD (gastroesophageal reflux disease)    Headache    Hyperlipidemia    Hypertensive heart disease    Lumbar disc disease  Metabolic encephalopathy    Mixed hyperlipidemia    Myocardial infarction (HCC)    Obstructive sleep apnea    OP (osteoporosis)    Osteoarthritis    Osteoporosis    Other malaise    Overweight(278.02)    PAD (peripheral artery disease) (HCC)    a. Emboli to R foot 2010 from partially occlusive lesion in R EIA, s/p stenting. - followed by Dr. Arbie Cookey;  b. 10/2015 ABIs: R 1.03, L 0.97.   Restless leg    Sleep apnea    Stroke [facility])    TIA (transient ischemic attack)    Tobacco abuse    Urge incontinence    Past Surgical History:  Procedure Laterality Date   CYST EXCISION Left 01/2021   left foot   EYE SURGERY     at age 68   hysterectomy -age 57     ILIAC ARTERY STENT     RIGHT ILIAC STENT   KNEE ARTHROSCOPY     LEFT HEART CATH AND CORONARY ANGIOGRAPHY N/A 03/05/2022   Procedure: LEFT HEART CATH AND CORONARY ANGIOGRAPHY;  Surgeon: Corky Crafts, MD;  Location: [facility];  Service: Cardiovascular;  Laterality: N/A;   LITHOTRIPSY Left 12/2020   LUMBAR LAMINECTOMY     TUBAL LIGATION      Family History  Problem Relation Age of Onset   Alzheimer's disease Mother    Hodgkin's lymphoma Brother    Lung cancer Brother    Hepatitis C Brother    Hypothyroidism Brother    Hypertension Brother    Depression Brother    Heart disease Other        Grandfather   Social History   Socioeconomic History   Marital status: Divorced    Spouse name: Not on file   Number of children: 3   Years of education: 10 th   Highest education level: Not on file  Occupational History   Occupation: DISABLED    Employer: UNEMPLOYED  Tobacco Use   Smoking status: Every Day    Current packs/day: 0.00    Average packs/day: 2.0 packs/day for 54.0 years (108.0 ttl pk-yrs)    Types: Cigarettes    Start date: 11/20/1958    Last  attempt to quit: 11/20/2012    Years since quitting: 10.7   Smokeless tobacco: Never   Tobacco comments:    2 ppd for many years.     04/13/2022 Patient smokes 1/2 pack daily or more if she is nervous  Substance and Sexual Activity   Alcohol use: No    Alcohol/week: 0.0 standard drinks of alcohol   Drug use: No   Sexual activity: Never  Other Topics Concern   Not on file  Social History Narrative   Patient is single with 3 children, 1 deceased.   Patient is right handed.   Patient has 10 th grade education.   Patient drinks 5 or more cups daily.   Social Determinants of Health   Financial Resource Strain: Low Risk  (02/27/2023)   Overall Financial Resource Strain (CARDIA)    Difficulty of Paying Living Expenses: Not hard at all  Food Insecurity: No Food Insecurity (09/14/2022)   Hunger Vital Sign    Worried About Running Out of Food in the Last Year: Never true    Ran Out of Food in the Last Year: Never true  Transportation Needs: No Transportation Needs (02/27/2023)   PRAPARE - Transportation    Lack of Transportation (Medical): No    Lack of  Transportation (Non-Medical): No  Physical Activity: Inactive (09/14/2022)   Exercise Vital Sign    Days of Exercise per Week: 0 days    Minutes of Exercise per Session: 0 min  Stress: Stress Concern Present (09/14/2022)   [employer] of Occupational Health - Occupational Stress Questionnaire    Feeling of Stress : Rather much  Social Connections: Moderately Isolated (09/14/2022)   Social Connection and Isolation Panel [NHANES]    Frequency of Communication with Friends and Family: More than three times a week    Frequency of Social Gatherings with Friends and Family: Never    Attends Religious Services: More than 4 times per year    Active Member of [employer] or Organizations: No    Attends Banker Meetings: Never    Marital Status: Divorced    Objective:  There were no vitals taken for this visit.     07/17/2023     5:07 PM 07/17/2023    3:50 PM 07/05/2023    9:26 AM  BP/Weight  Systolic BP 104 88 136  Diastolic BP 56 60 70  Wt. (Lbs)  159.2 157  BMI  26.49 kg/m2 26.13 kg/m2    Physical Exam  Diabetic Foot Exam - Simple   No data filed      Lab Results  Component Value Date   WBC 9.7 08/01/2023   HGB 12.7 08/01/2023   HCT 40.5 08/01/2023   PLT 247 08/01/2023   GLUCOSE 125 (H) 08/01/2023   CHOL 111 08/01/2023   TRIG 85 08/01/2023   HDL 40 08/01/2023   LDLCALC 54 08/01/2023   ALT 17 08/01/2023   AST 18 08/01/2023   NA 147 (H) 08/01/2023   K 5.2 08/01/2023   CL 107 (H) 08/01/2023   CREATININE 0.91 08/01/2023   BUN 32 (H) 08/01/2023   CO2 26 08/01/2023   TSH 0.734 04/26/2023   INR 1.0 01/31/2019   HGBA1C 7.5 (H) 08/01/2023   MICROALBUR neg 02/27/2021      Assessment & Plan:    There are no diagnoses linked to this encounter.   No orders of the defined types were placed in this encounter.   No orders of the defined types were placed in this encounter.    Follow-up: No follow-ups on file.   I,Muhannad Bignell I Leal-Borjas,acting as a scribe for Blane Ohara, MD.,have documented all relevant documentation on the behalf of Blane Ohara, MD,as directed by  Blane Ohara, MD while in the presence of Blane Ohara, MD.   An After Visit Summary was printed and given to the patient.  Blane Ohara, MD [name] [phone]

## 2023-08-05 ENCOUNTER — Other Ambulatory Visit: Payer: Self-pay

## 2023-08-05 ENCOUNTER — Encounter (INDEPENDENT_AMBULATORY_CARE_PROVIDER_SITE_OTHER): Payer: Medicare Other | Admitting: Family Medicine

## 2023-08-05 ENCOUNTER — Telehealth: Payer: Self-pay | Admitting: Cardiology

## 2023-08-05 ENCOUNTER — Other Ambulatory Visit: Payer: Self-pay | Admitting: Family Medicine

## 2023-08-05 DIAGNOSIS — E1140 Type 2 diabetes mellitus with diabetic neuropathy, unspecified: Secondary | ICD-10-CM

## 2023-08-05 MED ORDER — VALACYCLOVIR HCL 1 G PO TABS: ORAL_TABLET | ORAL | 2 refills | Status: AC

## 2023-08-05 NOTE — Telephone Encounter (Signed)
Patient is calling requesting to speak with Elnita Maxwell about patient's BP and meds. She states the pt has been keeping up with the readings, but the patient was not there at the time of call to report readings. She states you can call the patient to receive the readings to advise further.   Please advise.

## 2023-08-05 NOTE — Telephone Encounter (Signed)
Received message from Dr. [name]   I am happy with her current readings   [name] [name] MD, [facility]   _____________________________________  Noted  No further nursing outreach needed at this time

## 2023-08-05 NOTE — Telephone Encounter (Signed)
Patient identification verified by 2 forms. Marilynn Rail, RN   Called and spoke to patient  Patient states:   -was advised to call with BP reading   -last visit 10/13  -has decreased chlorthalidone to 12.5mg    -checks BP every morning before taking BP medications   -Has BP log for the past week   -11/4: 127/68 7:50am  -11/5: 122/66 7:20am   -11/5: 116/63 9:12pm  -11/6 122/69 7:30am   -11/6 109/59  9:21pm   -11/7 155/69 8:35am   -11/7 115/64  7:35pm   -11/8 141/71 7:22am  -11/8 117/67 8:00pm   -11/9  131/70 6:20am  -11/9  122/69 7:12pm  -11/10 107/70 7:47am   -11/10 118/65 9:04pm   -11/11 133/72 6:54am   Informed Patient message sent as FYI to Dr. [name]  Patient has no further questions at this time

## 2023-08-15 ENCOUNTER — Ambulatory Visit: Payer: Medicare Other

## 2023-08-15 DIAGNOSIS — M810 Age-related osteoporosis without current pathological fracture: Secondary | ICD-10-CM | POA: Diagnosis not present

## 2023-08-15 MED ORDER — ROMOSOZUMAB-AQQG 105 MG/1.17ML ~~LOC~~ SOSY
210.0000 mg | PREFILLED_SYRINGE | Freq: Once | SUBCUTANEOUS | Status: AC
Start: 2023-08-15 — End: 2023-08-15
  Administered 2023-08-15: 210 mg via SUBCUTANEOUS

## 2023-08-15 NOTE — Progress Notes (Signed)
Patient presents today for her monthly Evenity injections. Injections given in BL upper arms, sub q. Patient to return in 1 month for injections.

## 2023-08-26 ENCOUNTER — Other Ambulatory Visit: Payer: Self-pay | Admitting: Family Medicine

## 2023-08-26 DIAGNOSIS — R5383 Other fatigue: Secondary | ICD-10-CM

## 2023-09-05 ENCOUNTER — Encounter (INDEPENDENT_AMBULATORY_CARE_PROVIDER_SITE_OTHER): Payer: Medicare Other | Admitting: Family Medicine

## 2023-09-05 DIAGNOSIS — E782 Mixed hyperlipidemia: Secondary | ICD-10-CM

## 2023-09-05 NOTE — Progress Notes (Unsigned)
Subjective:  Patient ID: Sonya Small, female    DOB: 1951/03/16  Age: 72 y.o. MRN: [id number]  Chief Complaint  Patient presents with   Medical Management of Chronic Issues   Transitions Of Care    HPI   Patient was seen at [facility]  ED on 09/03/23 and discharged on 09/04/23 for leg weakness bilateral. She presented from outside hospital underwent MRI C-spine and MRI cervical spine did not show any acute findings.   Neurology was consulted and recommended outpatient follow-up if symptoms improved. She was ambulatory. Case management involved for placement the patient declined.   -CT head wo (09/03/2023): No acute changes -MRI head wo (09/03/2023): Old left cerebellar lacunar infarct. White matter changes. -Cervical spine MRI wo (09/03/2023): Limited exam-patient unable to complete due to pain/motion.? Edema/inflammation in region of right C4/5 and C5/6 facet joints. 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  No changes on her medication.  She comes in for follow-up of her hypertension.  Diabetes: sugars Taking ozempic 2 mg weekly. Last A1c 7.4.     06/10/2023   11:44 AM 06/10/2023   11:04 AM 04/29/2023   11:39 AM 01/22/2023    8:47 AM 09/14/2022    9:02 AM  Depression screen PHQ 2/9  Decreased Interest  0 3 0 3  Down, Depressed, Hopeless  0 3 3 3   PHQ - 2 Score  0 6 3 6   Altered sleeping 2  3 3  0  Tired, decreased energy 3  3 3 3   Change in appetite 0  1 3 0  Feeling bad or failure about yourself  3  3 1  0  Trouble concentrating 3  3 3 1   Moving slowly or fidgety/restless 0  3 3 0  Suicidal thoughts 0   0 0  PHQ-9 Score   22 19 10   Difficult doing work/chores Somewhat difficult  Somewhat difficult Very difficult Somewhat difficult        06/10/2023   11:00 AM  Fall Risk   Falls in the past year? 1  Number falls in past yr: 1  Injury with Fall? 1  Risk for fall due to : History of fall(s)  Follow up Falls evaluation completed;Falls  prevention discussed    Patient Care Team: Blane Ohara, MD as PCP - General (Family Medicine) [name], [name], MD as PCP - Cardiology (Cardiology) Marcellus Scott, MD as Referring Physician (Specialist)   Review of Systems  Constitutional:  Negative for chills, fatigue and fever.  HENT:  Negative for congestion, ear pain and sore throat.   Respiratory:  Negative for cough, chest tightness and shortness of breath.   Cardiovascular:  Negative for chest pain and palpitations.  Gastrointestinal:  Negative for abdominal pain, constipation, diarrhea, nausea and vomiting.  Endocrine: Negative for polydipsia, polyphagia and polyuria.  Genitourinary:  Negative for difficulty urinating and dysuria.  Musculoskeletal:  Negative for arthralgias, back pain and myalgias.  Skin:  Negative for rash.  Neurological: Negative.  Negative for headaches.       Leg weakness reported   Psychiatric/Behavioral:  Negative for dysphoric mood. The patient is not nervous/anxious.     Current Outpatient Medications on File Prior to Visit  Medication Sig Dispense Refill   albuterol (PROVENTIL) (2.5 MG/3ML) 0.083% nebulizer solution Take 3 mLs (2.5 mg total) by nebulization every 6 (six) hours as needed for wheezing or shortness of breath. 75 mL 2   albuterol (VENTOLIN HFA) 108 (90 Base) MCG/ACT inhaler Inhale 2  puffs into the lungs every 4 (four) hours as needed for wheezing or shortness of breath.     ALPRAZolam (XANAX) 0.5 MG tablet TAKE 1 TABLET BY MOUTH THREE TIMES DAILY 90 tablet 2   amLODipine (NORVASC) 5 MG tablet TAKE 1 TABLET (5 MG TOTAL) BY MOUTH DAILY. 90 tablet 0   Aspirin-Acetaminophen (GOODYS BODY PAIN PO) Take 1 packet by mouth daily.     baclofen (LIORESAL) 10 MG tablet Take 10 mg by mouth 3 (three) times daily as needed.     blood glucose meter kit and supplies KIT Dispense based on patient and insurance preference. Use up to four times daily as directed. 1 each 0   buPROPion (WELLBUTRIN XL) 150 MG  24 hr tablet TAKE 1 TABLET BY MOUTH ONCE DAILY 30 tablet 2   chlorthalidone (HYGROTON) 25 MG tablet Take 0.5 tablets (12.5 mg total) by mouth daily. 30 tablet 1   clopidogrel (PLAVIX) 75 MG tablet TAKE 1 TABLET BY MOUTH EVERY MORNING 30 tablet 10   clotrimazole (MYCELEX) 10 MG troche TAKE ONE TABLET BY MOUTH five times daily 25 Troche 1   docusate sodium (COLACE) 100 MG capsule Take 100 mg by mouth 2 (two) times daily as needed for mild constipation.     ezetimibe (ZETIA) 10 MG tablet TAKE 1 TABLET BY MOUTH ONCE DAILY 30 tablet 10   fenofibrate 160 MG tablet TAKE 1 TABLET BY MOUTH EVERY MORNING 30 tablet 10   fluticasone (FLONASE) 50 MCG/ACT nasal spray INSTILL 1 SPRAY IN EACH NOSTRIL ONCE DAILY (Patient taking differently: Place 1 spray into both nostrils daily as needed for rhinitis.) 16 g 1   gabapentin (NEURONTIN) 300 MG capsule TAKE 1 CAPSULE BY MOUTH EVERY MORNING AND TAKE 1 CAPSULE EVERY DAY AT BEDTIME 60 capsule 10   glucose blood (ONETOUCH ULTRA) test strip DISPENSE BASED ON PATIENT AND INSURANCE PREFERENCE. USE UP TO FOUR TIMES DAILY AS DIRECTED. 400 strip 3   guaiFENesin (MUCINEX) 600 MG 12 hr tablet Take 1 tablet (600 mg total) by mouth 2 (two) times daily. 60 tablet 5   lamoTRIgine (LAMICTAL) 25 MG tablet TAKE 2 TABLETS BY MOUTH EVERY DAY AT BEDTIME 60 tablet 10   lansoprazole (PREVACID) 30 MG capsule TAKE ONE CAPSULE BY MOUTH BEFORE BREAKFAST 90 capsule 3   Levomilnacipran HCl ER (FETZIMA) 40 MG CP24 Take 40 mg by mouth daily. 90 capsule 0   montelukast (SINGULAIR) 10 MG tablet Take 10 mg by mouth daily.     MYRBETRIQ 50 MG TB24 tablet TAKE 1 TABLET BY MOUTH ONCE DAILY 30 tablet 10   naloxone (NARCAN) nasal spray 4 mg/0.1 mL      nitroGLYCERIN (NITROSTAT) 0.4 MG SL tablet Dissolve 1 tab under tongue as needed for chest pain. May repeat every 5 minutes x 2 doses. If no relief call 9-1-1. 25 tablet 2   oxyCODONE-acetaminophen (PERCOCET) 10-325 MG tablet Take 1 tablet by mouth every 6  (six) hours as needed for pain.     rosuvastatin (CRESTOR) 20 MG tablet TAKE 1 TABLET BY MOUTH EVERY DAY AT BEDTIME 30 tablet 10   Semaglutide, 2 MG/DOSE, (OZEMPIC, 2 MG/DOSE,) 8 MG/3ML SOPN INJECT 2 MG SUBCUTANEOUSLY ONCE WEEKLY AS DIRECTED 3 mL 2   torsemide (DEMADEX) 20 MG tablet TAKE 2 TABLETS BY MOUTH ONCE DAILY 60 tablet 2   TRELEGY ELLIPTA 200-62.5-25 MCG/ACT AEPB Inhale 1 puff into the lungs daily.     TRUEplus Lancets 30G MISC 1 each by Does not apply route 2 (  two) times daily. E11.69 400 each 3   valACYclovir (VALTREX) 1000 MG tablet TAKE 2 TABLETS BY MOUTH TWICE DAILY AS NEEDED 20 tablet 2   valsartan (DIOVAN) 320 MG tablet Take 0.5 tablets (160 mg total) by mouth daily. 90 tablet 1   VASCEPA 1 g capsule TAKE TWO CAPSULES BY MOUTH TWICE DAILY 360 capsule 2   vitamin B-12 (CYANOCOBALAMIN) 50 MCG tablet Take 50 mcg by mouth daily.     Vitamin D, Ergocalciferol, (DRISDOL) 1.25 MG (50000 UNIT) CAPS capsule Take 1 capsule (50,000 Units total) by mouth every 7 (seven) days. 14 capsule 0   No current facility-administered medications on file prior to visit.   Past Medical History:  Diagnosis Date   Anxiety    CAD (coronary artery disease)    a. NSTEMI 11/2008 s/p DES to LCx (3.0x12 Xience); b. NSTEMI 01/2010 secondary to thrombotic RCA lesion (non-obstructive)-->med rx (integrilin x 24 hrs + plavix); c. 09/2012 negative Myoview.   Chronic diastolic CHF (congestive heart failure) (HCC)    a. 06/2014 Echo: EF 55-60%, no rwma, Gr1 DD, mild AI.   Depression    Dizziness    Drug induced constipation    Ganglion cyst of left foot 01/17/2021   Generalized hyperhidrosis    GERD (gastroesophageal reflux disease)    Headache    Hyperlipidemia    Hypertensive heart disease    Lumbar disc disease    Metabolic encephalopathy    Mixed hyperlipidemia    Myocardial infarction (HCC)    Obstructive sleep apnea    OP (osteoporosis)    Osteoarthritis    Osteoporosis    Other malaise     Overweight(278.02)    PAD (peripheral artery disease) (HCC)    a. Emboli to R foot 2010 from partially occlusive lesion in R EIA, s/p stenting. - followed by Dr. Arbie Cookey;  b. 10/2015 ABIs: R 1.03, L 0.97.   Restless leg    Sleep apnea    Stroke [facility])    TIA (transient ischemic attack)    Tobacco abuse    Urge incontinence    Past Surgical History:  Procedure Laterality Date   CYST EXCISION Left 01/2021   left foot   EYE SURGERY     at age 55   hysterectomy -age 76     ILIAC ARTERY STENT     RIGHT ILIAC STENT   KNEE ARTHROSCOPY     LEFT HEART CATH AND CORONARY ANGIOGRAPHY N/A 03/05/2022   Procedure: LEFT HEART CATH AND CORONARY ANGIOGRAPHY;  Surgeon: Corky Crafts, MD;  Location: [facility] INVASIVE CV LAB;  Service: Cardiovascular;  Laterality: N/A;   LITHOTRIPSY Left 12/2020   LUMBAR LAMINECTOMY     TUBAL LIGATION      Family History  Problem Relation Age of Onset   Alzheimer's disease Mother    Hodgkin's lymphoma Brother    Lung cancer Brother    Hepatitis C Brother    Hypothyroidism Brother    Hypertension Brother    Depression Brother    Heart disease Other        Grandfather   Social History   Socioeconomic History   Marital status: Divorced    Spouse name: Not on file   Number of children: 3   Years of education: 10 th   Highest education level: Not on file  Occupational History   Occupation: DISABLED    Employer: UNEMPLOYED  Tobacco Use   Smoking status: Every Day    Current packs/day: 0.00  Average packs/day: 2.0 packs/day for 54.0 years (108.0 ttl pk-yrs)    Types: Cigarettes    Start date: 11/20/1958    Last attempt to quit: 11/20/2012    Years since quitting: 10.8   Smokeless tobacco: Never   Tobacco comments:    2 ppd for many years.     04/13/2022 Patient smokes 1/2 pack daily or more if she is nervous  Substance and Sexual Activity   Alcohol use: No    Alcohol/week: 0.0 standard drinks of alcohol   Drug use: No   Sexual activity: Never  Other  Topics Concern   Not on file  Social History Narrative   Patient is single with 3 children, 1 deceased.   Patient is right handed.   Patient has 10 th grade education.   Patient drinks 5 or more cups daily.   Social Drivers of Corporate investment banker Strain: Low Risk  (02/27/2023)   Overall Financial Resource Strain (CARDIA)    Difficulty of Paying Living Expenses: Not hard at all  Food Insecurity: No Food Insecurity (09/14/2022)   Hunger Vital Sign    Worried About Running Out of Food in the Last Year: Never true    Ran Out of Food in the Last Year: Never true  Transportation Needs: No Transportation Needs (09/03/2023)   [facility] - [facility]    Lack of Transportation (Medical): No    Lack of Transportation (Non-Medical): No  Physical Activity: Inactive (09/14/2022)   Exercise Vital Sign    Days of Exercise per Week: 0 days    Minutes of Exercise per Session: 0 min  Stress: Stress Concern Present (09/14/2022)   [employer] of Occupational Health - Occupational Stress Questionnaire    Feeling of Stress : Rather much  Social Connections: Moderately Isolated (09/14/2022)   Social Connection and Isolation Panel [NHANES]    Frequency of Communication with Friends and Family: More than three times a week    Frequency of Social Gatherings with Friends and Family: Never    Attends Religious Services: More than 4 times per year    Active Member of [employer] or Organizations: No    Attends Engineer, structural: Never    Marital Status: Divorced    Objective:  BP (!) 84/46   Pulse 69   Temp (!) 97.4 F (36.3 C)   Resp 14   Ht 5\' 5"  (1.651 m)   Wt 154 lb (69.9 kg)   SpO2 94%   BMI 25.63 kg/m      09/06/2023    9:58 AM 07/17/2023    5:07 PM 07/17/2023    3:50 PM  BP/Weight  Systolic BP 84 104 88  Diastolic BP 46 56 60  Wt. (Lbs) 154  159.2  BMI 25.63 kg/m2  26.49 kg/m2    Physical Exam Vitals and nursing note reviewed.   Constitutional:      General: She is not in acute distress.    Appearance: She is not ill-appearing or diaphoretic.  HENT:     Head: Normocephalic and atraumatic.     Nose: Nose normal.     Mouth/Throat:     Mouth: Mucous membranes are moist.  Cardiovascular:     Rate and Rhythm: Normal rate and regular rhythm.  Pulmonary:     Effort: Pulmonary effort is normal.     Breath sounds: Normal breath sounds.  Musculoskeletal:        General: Normal range of motion.  Neurological:     General: No focal deficit present.     Mental Status: She is alert.  Psychiatric:        Mood and Affect: Mood normal.     Diabetic Foot Exam - Simple   No data filed      Lab Results  Component Value Date   WBC 9.7 08/01/2023   HGB 12.7 08/01/2023   HCT 40.5 08/01/2023   PLT 247 08/01/2023   GLUCOSE 125 (H) 08/01/2023   CHOL 111 08/01/2023   TRIG 85 08/01/2023   HDL 40 08/01/2023   LDLCALC 54 08/01/2023   ALT 17 08/01/2023   AST 18 08/01/2023   NA 147 (H) 08/01/2023   K 5.2 08/01/2023   CL 107 (H) 08/01/2023   CREATININE 0.91 08/01/2023   BUN 32 (H) 08/01/2023   CO2 26 08/01/2023   TSH 0.734 04/26/2023   INR 1.0 01/31/2019   HGBA1C 7.5 (H) 08/01/2023   MICROALBUR neg 02/27/2021      Assessment & Plan:    Hypertensive heart disease with chronic combined systolic and diastolic congestive heart failure (HCC)  Type 2 diabetes mellitus with diabetic neuropathy, without long-term current use of insulin (HCC)  Mixed hyperlipidemia  Vitamin D deficiency     No orders of the defined types were placed in this encounter.   No orders of the defined types were placed in this encounter.    Follow-up: No follow-ups on file.   I,Marla I Leal-Borjas,acting as a scribe for [employer], MD.,have documented all relevant documentation on the behalf of Windell Moment, MD,as directed by  Windell Moment, MD while in the presence of Windell Moment, MD.   An After Visit Summary was  printed and given to the patient.  Windell Moment, MD [name] [phone]

## 2023-09-05 NOTE — Progress Notes (Deleted)
Subjective:  Patient ID: Sonya Small, female    DOB: [birth year]  Age: 72 y.o. MRN: [id number]  No chief complaint on file.   HPI   Patient was seen at ED on 09/03/23 and discharged on 09/04/23 for leg weakness bilateral. She presented from outside hospital underwent MRI C-spine and MRI cervical spine did not show any acute findings.   Neurology was consulted and recommended outpatient follow-up if symptoms improved. She was ambulatory. Case management involved for placement the patient declined.   -CT head wo (09/03/2023): No acute changes -MRI head wo (09/03/2023): Old left cerebellar lacunar infarct. White matter changes. -Cervical spine MRI wo (09/03/2023): Limited exam-patient unable to complete due to pain/motion.? Edema/inflammation in region of right C4/5 and C5/6 facet joints. 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  No changes on her medication.  She comes in for follow-up of her hypertension.  Diabetes: sugars Taking ozempic 2 mg weekly. Last A1c 7.4.     06/10/2023   11:44 AM 06/10/2023   11:04 AM 04/29/2023   11:39 AM 01/22/2023    8:47 AM 09/14/2022    9:02 AM  Depression screen PHQ 2/9  Decreased Interest  0 3 0 3  Down, Depressed, Hopeless  0 3 3 3   PHQ - 2 Score  0 6 3 6   Altered sleeping 2  3 3  0  Tired, decreased energy 3  3 3 3   Change in appetite 0  1 3 0  Feeling bad or failure about yourself  3  3 1  0  Trouble concentrating 3  3 3 1   Moving slowly or fidgety/restless 0  3 3 0  Suicidal thoughts 0   0 0  PHQ-9 Score   22 19 10   Difficult doing work/chores Somewhat difficult  Somewhat difficult Very difficult Somewhat difficult        06/10/2023   11:00 AM  Fall Risk   Falls in the past year? 1  Number falls in past yr: 1  Injury with Fall? 1  Risk for fall due to : History of fall(s)  Follow up Falls evaluation completed;Falls prevention discussed    Patient Care Team: [name], Fritzi Mandes, MD as PCP - General (Family  Medicine) [name], [name], MD as PCP - Cardiology (Cardiology) Marcellus Scott, MD as Referring Physician (Specialist)   Review of Systems  Current Outpatient Medications on File Prior to Visit  Medication Sig Dispense Refill   albuterol (PROVENTIL) (2.5 MG/3ML) 0.083% nebulizer solution Take 3 mLs (2.5 mg total) by nebulization every 6 (six) hours as needed for wheezing or shortness of breath. 75 mL 2   albuterol (VENTOLIN HFA) 108 (90 Base) MCG/ACT inhaler Inhale 2 puffs into the lungs every 4 (four) hours as needed for wheezing or shortness of breath.     ALPRAZolam (XANAX) 0.5 MG tablet TAKE 1 TABLET BY MOUTH THREE TIMES DAILY 90 tablet 2   amLODipine (NORVASC) 5 MG tablet TAKE 1 TABLET (5 MG TOTAL) BY MOUTH DAILY. 90 tablet 0   Aspirin-Acetaminophen (GOODYS BODY PAIN PO) Take 1 packet by mouth daily.     baclofen (LIORESAL) 10 MG tablet Take 10 mg by mouth 3 (three) times daily as needed.     blood glucose meter kit and supplies KIT Dispense based on patient and insurance preference. Use up to four times daily as directed. 1 each 0   buPROPion (WELLBUTRIN XL) 150 MG 24 hr tablet TAKE 1 TABLET BY MOUTH ONCE DAILY 30 tablet  2   chlorthalidone (HYGROTON) 25 MG tablet Take 0.5 tablets (12.5 mg total) by mouth daily. 30 tablet 1   clopidogrel (PLAVIX) 75 MG tablet TAKE 1 TABLET BY MOUTH EVERY MORNING 30 tablet 10   clotrimazole (MYCELEX) 10 MG troche TAKE ONE TABLET BY MOUTH five times daily 25 Troche 1   docusate sodium (COLACE) 100 MG capsule Take 100 mg by mouth 2 (two) times daily as needed for mild constipation.     ezetimibe (ZETIA) 10 MG tablet TAKE 1 TABLET BY MOUTH ONCE DAILY 30 tablet 10   fenofibrate 160 MG tablet TAKE 1 TABLET BY MOUTH EVERY MORNING 30 tablet 10   fluticasone (FLONASE) 50 MCG/ACT nasal spray INSTILL 1 SPRAY IN EACH NOSTRIL ONCE DAILY (Patient taking differently: Place 1 spray into both nostrils daily as needed for rhinitis.) 16 g 1   gabapentin (NEURONTIN) 300 MG  capsule TAKE 1 CAPSULE BY MOUTH EVERY MORNING AND TAKE 1 CAPSULE EVERY DAY AT BEDTIME 60 capsule 10   glucose blood (ONETOUCH ULTRA) test strip DISPENSE BASED ON PATIENT AND INSURANCE PREFERENCE. USE UP TO FOUR TIMES DAILY AS DIRECTED. 400 strip 3   guaiFENesin (MUCINEX) 600 MG 12 hr tablet Take 1 tablet (600 mg total) by mouth 2 (two) times daily. 60 tablet 5   lamoTRIgine (LAMICTAL) 25 MG tablet TAKE 2 TABLETS BY MOUTH EVERY DAY AT BEDTIME 60 tablet 10   lansoprazole (PREVACID) 30 MG capsule TAKE ONE CAPSULE BY MOUTH BEFORE BREAKFAST 90 capsule 3   Levomilnacipran HCl ER (FETZIMA) 40 MG CP24 Take 40 mg by mouth daily. 90 capsule 0   montelukast (SINGULAIR) 10 MG tablet Take 10 mg by mouth daily.     MYRBETRIQ 50 MG TB24 tablet TAKE 1 TABLET BY MOUTH ONCE DAILY 30 tablet 10   naloxone (NARCAN) nasal spray 4 mg/0.1 mL      nitroGLYCERIN (NITROSTAT) 0.4 MG SL tablet Dissolve 1 tab under tongue as needed for chest pain. May repeat every 5 minutes x 2 doses. If no relief call 9-1-1. 25 tablet 2   oxyCODONE-acetaminophen (PERCOCET) 10-325 MG tablet Take 1 tablet by mouth every 6 (six) hours as needed for pain.     rosuvastatin (CRESTOR) 20 MG tablet TAKE 1 TABLET BY MOUTH EVERY DAY AT BEDTIME 30 tablet 10   Semaglutide, 2 MG/DOSE, (OZEMPIC, 2 MG/DOSE,) 8 MG/3ML SOPN INJECT 2 MG SUBCUTANEOUSLY ONCE WEEKLY AS DIRECTED 3 mL 2   torsemide (DEMADEX) 20 MG tablet TAKE 2 TABLETS BY MOUTH ONCE DAILY 60 tablet 2   TRELEGY ELLIPTA 200-62.5-25 MCG/ACT AEPB Inhale 1 puff into the lungs daily.     TRUEplus Lancets 30G MISC 1 each by Does not apply route 2 (two) times daily. E11.69 400 each 3   valACYclovir (VALTREX) 1000 MG tablet TAKE 2 TABLETS BY MOUTH TWICE DAILY AS NEEDED 20 tablet 2   valsartan (DIOVAN) 320 MG tablet Take 0.5 tablets (160 mg total) by mouth daily. 90 tablet 1   VASCEPA 1 g capsule TAKE TWO CAPSULES BY MOUTH TWICE DAILY 360 capsule 2   vitamin B-12 (CYANOCOBALAMIN) 50 MCG tablet Take 50 mcg by  mouth daily.     Vitamin D, Ergocalciferol, (DRISDOL) 1.25 MG (50000 UNIT) CAPS capsule Take 1 capsule (50,000 Units total) by mouth every 7 (seven) days. 14 capsule 0   No current facility-administered medications on file prior to visit.   Past Medical History:  Diagnosis Date   Anxiety    CAD (coronary artery disease)  a. NSTEMI 11/2008 s/p DES to LCx (3.0x12 Xience); b. NSTEMI 01/2010 secondary to thrombotic RCA lesion (non-obstructive)-->med rx (integrilin x 24 hrs + plavix); c. 09/2012 negative Myoview.   Chronic diastolic CHF (congestive heart failure) (HCC)    a. 06/2014 Echo: EF 55-60%, no rwma, Gr1 DD, mild AI.   Depression    Dizziness    Drug induced constipation    Ganglion cyst of left foot 01/17/2021   Generalized hyperhidrosis    GERD (gastroesophageal reflux disease)    Headache    Hyperlipidemia    Hypertensive heart disease    Lumbar disc disease    Metabolic encephalopathy    Mixed hyperlipidemia    Myocardial infarction (HCC)    Obstructive sleep apnea    OP (osteoporosis)    Osteoarthritis    Osteoporosis    Other malaise    Overweight(278.02)    PAD (peripheral artery disease) (HCC)    a. Emboli to R foot 2010 from partially occlusive lesion in R EIA, s/p stenting. - followed by Dr. Arbie Cookey;  b. 10/2015 ABIs: R 1.03, L 0.97.   Restless leg    Sleep apnea    Stroke [facility])    TIA (transient ischemic attack)    Tobacco abuse    Urge incontinence    Past Surgical History:  Procedure Laterality Date   CYST EXCISION Left 01/2021   left foot   EYE SURGERY     at age 69   hysterectomy -age 64     ILIAC ARTERY STENT     RIGHT ILIAC STENT   KNEE ARTHROSCOPY     LEFT HEART CATH AND CORONARY ANGIOGRAPHY N/A 03/05/2022   Procedure: LEFT HEART CATH AND CORONARY ANGIOGRAPHY;  Surgeon: Corky Crafts, MD;  Location: [facility];  Service: Cardiovascular;  Laterality: N/A;   LITHOTRIPSY Left 12/2020   LUMBAR LAMINECTOMY     TUBAL LIGATION      Family  History  Problem Relation Age of Onset   Alzheimer's disease Mother    Hodgkin's lymphoma Brother    Lung cancer Brother    Hepatitis C Brother    Hypothyroidism Brother    Hypertension Brother    Depression Brother    Heart disease Other        Grandfather   Social History   Socioeconomic History   Marital status: Divorced    Spouse name: Not on file   Number of children: 3   Years of education: 10 th   Highest education level: Not on file  Occupational History   Occupation: DISABLED    Employer: UNEMPLOYED  Tobacco Use   Smoking status: Every Day    Current packs/day: 0.00    Average packs/day: 2.0 packs/day for 54.0 years (108.0 ttl pk-yrs)    Types: Cigarettes    Start date: 11/20/1958    Last attempt to quit: 11/20/2012    Years since quitting: 10.7   Smokeless tobacco: Never   Tobacco comments:    2 ppd for many years.     04/13/2022 Patient smokes 1/2 pack daily or more if she is nervous  Substance and Sexual Activity   Alcohol use: No    Alcohol/week: 0.0 standard drinks of alcohol   Drug use: No   Sexual activity: Never  Other Topics Concern   Not on file  Social History Narrative   Patient is single with 3 children, 1 deceased.   Patient is right handed.   Patient has 10 th grade education.  Patient drinks 5 or more cups daily.   Social Drivers of Corporate investment banker Strain: Low Risk  (02/27/2023)   Overall Financial Resource Strain (CARDIA)    Difficulty of Paying Living Expenses: Not hard at all  Food Insecurity: No Food Insecurity (09/14/2022)   Hunger Vital Sign    Worried About Running Out of Food in the Last Year: Never true    Ran Out of Food in the Last Year: Never true  Transportation Needs: No Transportation Needs (09/03/2023)   [facility]    Lack of Transportation (Medical): No    Lack of Transportation (Non-Medical): No  Physical Activity: Inactive (09/14/2022)   Exercise Vital Sign     Days of Exercise per Week: 0 days    Minutes of Exercise per Session: 0 min  Stress: Stress Concern Present (09/14/2022)   [employer] of Occupational Health - Occupational Stress Questionnaire    Feeling of Stress : Rather much  Social Connections: Moderately Isolated (09/14/2022)   Social Connection and Isolation Panel [NHANES]    Frequency of Communication with Friends and Family: More than three times a week    Frequency of Social Gatherings with Friends and Family: Never    Attends Religious Services: More than 4 times per year    Active Member of [employer] or Organizations: No    Attends Banker Meetings: Never    Marital Status: Divorced    Objective:  There were no vitals taken for this visit.     07/17/2023    5:07 PM 07/17/2023    3:50 PM 07/05/2023    9:26 AM  BP/Weight  Systolic BP 104 88 136  Diastolic BP 56 60 70  Wt. (Lbs)  159.2 157  BMI  26.49 kg/m2 26.13 kg/m2    Physical Exam  Diabetic Foot Exam - Simple   No data filed      Lab Results  Component Value Date   WBC 9.7 08/01/2023   HGB 12.7 08/01/2023   HCT 40.5 08/01/2023   PLT 247 08/01/2023   GLUCOSE 125 (H) 08/01/2023   CHOL 111 08/01/2023   TRIG 85 08/01/2023   HDL 40 08/01/2023   LDLCALC 54 08/01/2023   ALT 17 08/01/2023   AST 18 08/01/2023   NA 147 (H) 08/01/2023   K 5.2 08/01/2023   CL 107 (H) 08/01/2023   CREATININE 0.91 08/01/2023   BUN 32 (H) 08/01/2023   CO2 26 08/01/2023   TSH 0.734 04/26/2023   INR 1.0 01/31/2019   HGBA1C 7.5 (H) 08/01/2023   MICROALBUR neg 02/27/2021      Assessment & Plan:    There are no diagnoses linked to this encounter.   No orders of the defined types were placed in this encounter.   No orders of the defined types were placed in this encounter.    Follow-up: No follow-ups on file.   I,Bronte Sabado I Leal-Borjas,acting as a scribe for Blane Ohara, MD.,have documented all relevant documentation on the behalf of Blane Ohara,  MD,as directed by  Blane Ohara, MD while in the presence of Blane Ohara, MD.   An After Visit Summary was printed and given to the patient.  Blane Ohara, MD [name] [phone]

## 2023-09-06 ENCOUNTER — Ambulatory Visit (INDEPENDENT_AMBULATORY_CARE_PROVIDER_SITE_OTHER): Payer: Medicare Other

## 2023-09-06 VITALS — BP 80/44 | HR 69 | Temp 97.4°F | Resp 14 | Ht 65.0 in | Wt 154.0 lb

## 2023-09-06 DIAGNOSIS — I952 Hypotension due to drugs: Secondary | ICD-10-CM

## 2023-09-06 DIAGNOSIS — E1140 Type 2 diabetes mellitus with diabetic neuropathy, unspecified: Secondary | ICD-10-CM

## 2023-09-06 DIAGNOSIS — I5042 Chronic combined systolic (congestive) and diastolic (congestive) heart failure: Secondary | ICD-10-CM | POA: Diagnosis not present

## 2023-09-06 DIAGNOSIS — I110 Hypertensive heart disease with heart failure: Secondary | ICD-10-CM | POA: Diagnosis not present

## 2023-09-06 DIAGNOSIS — E559 Vitamin D deficiency, unspecified: Secondary | ICD-10-CM

## 2023-09-06 DIAGNOSIS — E782 Mixed hyperlipidemia: Secondary | ICD-10-CM

## 2023-09-06 NOTE — Patient Instructions (Addendum)
[facility]- [phone]   During today's visit, we discussed your ongoing symptoms of cognitive and physical impairment, including difficulty thinking clearly and walking. We reviewed your recent emergency department visit, where no new strokes were found, but an old stroke in the cerebellum was noted. We also addressed your frequent urination and current medication regimen. Your history of smoking and caring for multiple pets was also noted.  YOUR PLAN:  -HYPOTENSION: Hypotension means low blood pressure, which can cause symptoms like weakness, difficulty thinking, and leg weakness. We will hold your chlorthalidone and amlodipine, reduce torsemide to 20 mg daily, continue valsartan 160 mg daily, and monitor your blood pressure and blood sugar daily. Increase your fluid intake with water and Gatorade, elevate your legs when lying down, and return for a follow-up in one week. Call 911 if your symptoms worsen.

## 2023-09-07 DIAGNOSIS — I959 Hypotension, unspecified: Secondary | ICD-10-CM | POA: Insufficient documentation

## 2023-09-07 DIAGNOSIS — I952 Hypotension due to drugs: Secondary | ICD-10-CM | POA: Insufficient documentation

## 2023-09-07 NOTE — Assessment & Plan Note (Signed)
Advised to monitor blood sugars too when she feels too weak or dizzy, Latest A1C 7.5

## 2023-09-07 NOTE — Assessment & Plan Note (Signed)
RECENT low blood pressure with recent readings of 94/62 mmHg and 92/55 mmHg and many more low readings. . Symptoms include weakness, cognitive difficulties, and leg weakness. Likely exacerbated by multiple antihypertensive medications. Discussed risks of persistent hypotension and benefits of adjusting medications. UNSURE WHY HER BP STARTED GETTING LOW. No signs of infection on her recent work up.  Unsure if it is polypharmacy,.   Patient prefers to adjust medications as recommended. - Hold chlorthalidone - Hold amlodipine - Reduce torsemide to 20 mg daily - Continue valsartan 160 mg daily - Monitor blood pressure and blood sugar daily - Increase fluid intake, including water and Gatorade - Elevate legs when lying down - Return for follow-up in one week - Call 911 if symptoms worsen

## 2023-09-07 NOTE — Assessment & Plan Note (Signed)
Managed with multiple medications including amlodipine, chlorthalidone, torsemide, and valsartan. Recent low blood pressure readings suggest overmedication. Discussed risks of overmedication and benefits of adjusting medications. - Adjust antihypertensive medications as per hypotension plan - Monitor blood pressure daily - Follow up with cardiologist if blood pressure remains uncontrolled  Stroke Three previous strokes with recent imaging showing an old cerebellar stroke. No new strokes identified on recent CT and MRI. Discussed importance of stroke prevention measures and follow-up with a neurologist. - Continue current stroke prevention measures - Follow up with neurologist as previously recommended  Smoking Cessation Smokes nearly a pack a day, detrimental to cardiovascular health. Discussed risks of continued smoking and benefits of cessation. Provided resources for cessation programs. - Advise smoking cessation - Provide resources for smoking cessation programs  Follow-up - Return for follow-up in one week - Call 911 if symptoms worsen - Message pharmacist at [facility] to update medication packs.

## 2023-09-08 NOTE — Progress Notes (Signed)
Cancelled. Dr. [name]  

## 2023-09-08 NOTE — Progress Notes (Signed)
Subjective:  Patient ID: Sonya Small, female    DOB: 11-05-1950  Age: 72 y.o. MRN: [id number]  Chief Complaint  Patient presents with   Fatigue    HPI Patient was seen on 09/06/2023 at the office for follow up of emergency department due to leg weakness, and hypotension. Her bp was 80-90/60s on Friday.  Instructions from Friday include the following: - Hold chlorthalidone. Hold amlodipine. Reduced torsemide to 20 mg daily. - Continue valsartan 160 mg daily.  She continues to feel poorly. She has fatigue.       09/09/2023    9:51 AM 06/10/2023   11:44 AM 06/10/2023   11:04 AM 04/29/2023   11:39 AM 01/22/2023    8:47 AM  Depression screen PHQ 2/9  Decreased Interest 0  0 3 0  Down, Depressed, Hopeless 0  0 3 3  PHQ - 2 Score 0  0 6 3  Altered sleeping 0 2  3 3   Tired, decreased energy 0 3  3 3   Change in appetite 0 0  1 3  Feeling bad or failure about yourself  0 3  3 1   Trouble concentrating 0 3  3 3   Moving slowly or fidgety/restless 0 0  3 3  Suicidal thoughts 0 0   0  PHQ-9 Score 0   22 19  Difficult doing work/chores Not difficult at all Somewhat difficult  Somewhat difficult Very difficult        06/10/2023   11:00 AM  Fall Risk   Falls in the past year? 1  Number falls in past yr: 1  Injury with Fall? 1  Risk for fall due to : History of fall(s)  Follow up Falls evaluation completed;Falls prevention discussed    Patient Care Team: Blane Ohara, MD as PCP - General (Family Medicine) [name], [name], MD as PCP - Cardiology (Cardiology) Marcellus Scott, MD as Referring Physician (Specialist)   Review of Systems  Constitutional:  Negative for chills, fatigue and fever.  HENT:  Negative for congestion, ear pain, rhinorrhea and sore throat.   Respiratory:  Negative for cough and shortness of breath.   Cardiovascular:  Negative for chest pain.  Gastrointestinal:  Negative for abdominal pain, constipation, diarrhea, nausea and vomiting.  Genitourinary:  Negative  for dysuria and urgency.  Musculoskeletal:  Negative for back pain and myalgias.  Neurological:  Negative for dizziness, weakness, light-headedness and headaches.  Psychiatric/Behavioral:  Negative for dysphoric mood. The patient is not nervous/anxious.     Current Outpatient Medications on File Prior to Visit  Medication Sig Dispense Refill   albuterol (PROVENTIL) (2.5 MG/3ML) 0.083% nebulizer solution Take 3 mLs (2.5 mg total) by nebulization every 6 (six) hours as needed for wheezing or shortness of breath. 75 mL 2   albuterol (VENTOLIN HFA) 108 (90 Base) MCG/ACT inhaler Inhale 2 puffs into the lungs every 4 (four) hours as needed for wheezing or shortness of breath.     ALPRAZolam (XANAX) 0.5 MG tablet TAKE 1 TABLET BY MOUTH THREE TIMES DAILY 90 tablet 2   amLODipine (NORVASC) 5 MG tablet TAKE 1 TABLET (5 MG TOTAL) BY MOUTH DAILY. 90 tablet 0   Aspirin-Acetaminophen (GOODYS BODY PAIN PO) Take 1 packet by mouth daily.     baclofen (LIORESAL) 10 MG tablet Take 10 mg by mouth 3 (three) times daily as needed.     blood glucose meter kit and supplies KIT Dispense based on patient and insurance preference. Use up to four times daily  as directed. 1 each 0   buPROPion (WELLBUTRIN XL) 150 MG 24 hr tablet TAKE 1 TABLET BY MOUTH ONCE DAILY 30 tablet 2   chlorthalidone (HYGROTON) 25 MG tablet Take 0.5 tablets (12.5 mg total) by mouth daily. 30 tablet 1   clopidogrel (PLAVIX) 75 MG tablet TAKE 1 TABLET BY MOUTH EVERY MORNING 30 tablet 10   clotrimazole (MYCELEX) 10 MG troche TAKE ONE TABLET BY MOUTH five times daily 25 Troche 1   docusate sodium (COLACE) 100 MG capsule Take 100 mg by mouth 2 (two) times daily as needed for mild constipation.     ezetimibe (ZETIA) 10 MG tablet TAKE 1 TABLET BY MOUTH ONCE DAILY 30 tablet 10   fenofibrate 160 MG tablet TAKE 1 TABLET BY MOUTH EVERY MORNING 30 tablet 10   fluticasone (FLONASE) 50 MCG/ACT nasal spray INSTILL 1 SPRAY IN EACH NOSTRIL ONCE DAILY (Patient taking  differently: Place 1 spray into both nostrils daily as needed for rhinitis.) 16 g 1   gabapentin (NEURONTIN) 300 MG capsule TAKE 1 CAPSULE BY MOUTH EVERY MORNING AND TAKE 1 CAPSULE EVERY DAY AT BEDTIME 60 capsule 10   glucose blood (ONETOUCH ULTRA) test strip DISPENSE BASED ON PATIENT AND INSURANCE PREFERENCE. USE UP TO FOUR TIMES DAILY AS DIRECTED. 400 strip 3   guaiFENesin (MUCINEX) 600 MG 12 hr tablet Take 1 tablet (600 mg total) by mouth 2 (two) times daily. 60 tablet 5   lamoTRIgine (LAMICTAL) 25 MG tablet TAKE 2 TABLETS BY MOUTH EVERY DAY AT BEDTIME 60 tablet 10   lansoprazole (PREVACID) 30 MG capsule TAKE ONE CAPSULE BY MOUTH BEFORE BREAKFAST 90 capsule 3   Levomilnacipran HCl ER (FETZIMA) 40 MG CP24 Take 40 mg by mouth daily. 90 capsule 0   montelukast (SINGULAIR) 10 MG tablet Take 10 mg by mouth daily.     MYRBETRIQ 50 MG TB24 tablet TAKE 1 TABLET BY MOUTH ONCE DAILY 30 tablet 10   naloxone (NARCAN) nasal spray 4 mg/0.1 mL      nitroGLYCERIN (NITROSTAT) 0.4 MG SL tablet Dissolve 1 tab under tongue as needed for chest pain. May repeat every 5 minutes x 2 doses. If no relief call 9-1-1. 25 tablet 2   oxyCODONE-acetaminophen (PERCOCET) 10-325 MG tablet Take 0.5 tablets by mouth in the morning and at bedtime.     rosuvastatin (CRESTOR) 20 MG tablet TAKE 1 TABLET BY MOUTH EVERY DAY AT BEDTIME 30 tablet 10   Semaglutide, 2 MG/DOSE, (OZEMPIC, 2 MG/DOSE,) 8 MG/3ML SOPN INJECT 2 MG SUBCUTANEOUSLY ONCE WEEKLY AS DIRECTED 3 mL 2   torsemide (DEMADEX) 20 MG tablet TAKE 2 TABLETS BY MOUTH ONCE DAILY 60 tablet 2   TRELEGY ELLIPTA 200-62.5-25 MCG/ACT AEPB Inhale 1 puff into the lungs daily.     TRUEplus Lancets 30G MISC 1 each by Does not apply route 2 (two) times daily. E11.69 400 each 3   valACYclovir (VALTREX) 1000 MG tablet TAKE 2 TABLETS BY MOUTH TWICE DAILY AS NEEDED 20 tablet 2   valsartan (DIOVAN) 320 MG tablet Take 0.5 tablets (160 mg total) by mouth daily. 90 tablet 1   VASCEPA 1 g capsule  TAKE TWO CAPSULES BY MOUTH TWICE DAILY 360 capsule 2   vitamin B-12 (CYANOCOBALAMIN) 50 MCG tablet Take 50 mcg by mouth daily.     Vitamin D, Ergocalciferol, (DRISDOL) 1.25 MG (50000 UNIT) CAPS capsule Take 1 capsule (50,000 Units total) by mouth every 7 (seven) days. 14 capsule 0   No current facility-administered medications on file prior to  visit.   Past Medical History:  Diagnosis Date   Anxiety    CAD (coronary artery disease)    a. NSTEMI 11/2008 s/p DES to LCx (3.0x12 Xience); b. NSTEMI 01/2010 secondary to thrombotic RCA lesion (non-obstructive)-->med rx (integrilin x 24 hrs + plavix); c. 09/2012 negative Myoview.   Chronic diastolic CHF (congestive heart failure) (HCC)    a. 06/2014 Echo: EF 55-60%, no rwma, Gr1 DD, mild AI.   Depression    Dizziness    Drug induced constipation    Ganglion cyst of left foot 01/17/2021   Generalized hyperhidrosis    GERD (gastroesophageal reflux disease)    Headache    Hyperlipidemia    Hypertensive heart disease    Lumbar disc disease    Metabolic encephalopathy    Mixed hyperlipidemia    Myocardial infarction (HCC)    Obstructive sleep apnea    OP (osteoporosis)    Osteoarthritis    Osteoporosis    Other malaise    Overweight(278.02)    PAD (peripheral artery disease) (HCC)    a. Emboli to R foot 2010 from partially occlusive lesion in R EIA, s/p stenting. - followed by Dr. Arbie Cookey;  b. 10/2015 ABIs: R 1.03, L 0.97.   Restless leg    Sleep apnea    Stroke [facility])    TIA (transient ischemic attack)    Tobacco abuse    Urge incontinence    Past Surgical History:  Procedure Laterality Date   CYST EXCISION Left 01/2021   left foot   EYE SURGERY     at age 70   hysterectomy -age 50     ILIAC ARTERY STENT     RIGHT ILIAC STENT   KNEE ARTHROSCOPY     LEFT HEART CATH AND CORONARY ANGIOGRAPHY N/A 03/05/2022   Procedure: LEFT HEART CATH AND CORONARY ANGIOGRAPHY;  Surgeon: Corky Crafts, MD;  Location: [facility] INVASIVE CV [facility];  Service:  Cardiovascular;  Laterality: N/A;   LITHOTRIPSY Left 12/2020   LUMBAR LAMINECTOMY     TUBAL LIGATION      Family History  Problem Relation Age of Onset   Alzheimer's disease Mother    Hodgkin's lymphoma Brother    Lung cancer Brother    Hepatitis C Brother    Hypothyroidism Brother    Hypertension Brother    Depression Brother    Heart disease Other        Grandfather   Social History   Socioeconomic History   Marital status: Divorced    Spouse name: Not on file   Number of children: 3   Years of education: 10 th   Highest education level: Not on file  Occupational History   Occupation: DISABLED    Employer: UNEMPLOYED  Tobacco Use   Smoking status: Every Day    Current packs/day: 0.00    Average packs/day: 2.0 packs/day for 54.0 years (108.0 ttl pk-yrs)    Types: Cigarettes    Start date: 11/20/1958    Last attempt to quit: 11/20/2012    Years since quitting: 10.8   Smokeless tobacco: Never   Tobacco comments:    2 ppd for many years.     04/13/2022 Patient smokes 1/2 pack daily or more if she is nervous  Substance and Sexual Activity   Alcohol use: No    Alcohol/week: 0.0 standard drinks of alcohol   Drug use: No   Sexual activity: Never  Other Topics Concern   Not on file  Social History Narrative  Patient is single with 3 children, 1 deceased.   Patient is right handed.   Patient has 10 th grade education.   Patient drinks 5 or more cups daily.   Social Drivers of Corporate investment banker Strain: Low Risk  (02/27/2023)   Overall Financial Resource Strain (CARDIA)    Difficulty of Paying Living Expenses: Not hard at all  Food Insecurity: No Food Insecurity (09/11/2023)   Received from [facility]   Hunger Vital Sign    Worried About Running Out of Food in the Last Year: Never true    Ran Out of Food in the Last Year: Never true  Transportation Needs: No Transportation Needs (09/11/2023)   Received from [facility] - Transportation     Lack of Transportation (Medical): No    Lack of Transportation (Non-Medical): No  Physical Activity: Inactive (09/14/2022)   Exercise Vital Sign    Days of Exercise per Week: 0 days    Minutes of Exercise per Session: 0 min  Stress: No Stress Concern Present (09/11/2023)   Received from [facility] - Occupational Stress Questionnaire    Feeling of Stress : Not at all  Social Connections: Moderately Isolated (09/14/2022)   Social Connection and Isolation Panel [NHANES]    Frequency of Communication with Friends and Family: More than three times a week    Frequency of Social Gatherings with Friends and Family: Never    Attends Religious Services: More than 4 times per year    Active Member of [employer] or Organizations: No    Attends Engineer, structural: Never    Marital Status: Divorced    Objective:  BP (!) 124/56   Pulse 78   Temp 97.8 F (36.6 C)   Ht 5\' 5"  (1.651 m)   Wt 153 lb (69.4 kg)   SpO2 98%   BMI 25.46 kg/m      09/09/2023    9:45 AM 09/06/2023   10:53 AM 09/06/2023    9:58 AM  BP/Weight  Systolic BP 124 80 84  Diastolic BP 56 44 46  Wt. (Lbs) 153  154  BMI 25.46 kg/m2  25.63 kg/m2    Physical Exam Vitals reviewed.  Constitutional:      Appearance: Normal appearance. She is normal weight.  Neck:     Vascular: No carotid bruit.  Cardiovascular:     Rate and Rhythm: Normal rate and regular rhythm.     Heart sounds: Normal heart sounds.  Pulmonary:     Effort: Pulmonary effort is normal. No respiratory distress.     Breath sounds: Normal breath sounds.  Abdominal:     General: Abdomen is flat. Bowel sounds are normal.     Palpations: Abdomen is soft.     Tenderness: There is no abdominal tenderness.  Neurological:     Mental Status: She is alert and oriented to person, place, and time.  Psychiatric:        Mood and Affect: Mood normal.        Behavior: Behavior normal.     Diabetic Foot Exam  - Simple   No data filed      Lab Results  Component Value Date   WBC 9.7 08/01/2023   HGB 12.7 08/01/2023   HCT 40.5 08/01/2023   PLT 247 08/01/2023   GLUCOSE 125 (H) 08/01/2023   CHOL 111 08/01/2023   TRIG 85 08/01/2023   HDL 40 08/01/2023  LDLCALC 54 08/01/2023   ALT 17 08/01/2023   AST 18 08/01/2023   NA 147 (H) 08/01/2023   K 5.2 08/01/2023   CL 107 (H) 08/01/2023   CREATININE 0.91 08/01/2023   BUN 32 (H) 08/01/2023   CO2 26 08/01/2023   TSH 0.734 04/26/2023   INR 1.0 01/31/2019   HGBA1C 7.5 (H) 08/01/2023   MICROALBUR neg 02/27/2021      Assessment & Plan:    Other specified hypotension Assessment & Plan: Resolved.    Hypertensive heart disease with chronic combined systolic and diastolic congestive heart failure (HCC) Assessment & Plan: BP improved.  Continue to hold chlorthalidone and amlodipine.  Continue valsartan 160 mg daily and torsemide 20 mg daily.    Dysuria -     POCT URINALYSIS DIP (CLINITEK) -     Urine Culture     No orders of the defined types were placed in this encounter.   Orders Placed This Encounter  Procedures   Urine Culture   POCT URINALYSIS DIP (CLINITEK)     Follow-up: Return in about 2 weeks (around 09/23/2023).   I,Marla I Leal-Borjas,acting as a scribe for Blane Ohara, MD.,have documented all relevant documentation on the behalf of Blane Ohara, MD,as directed by  Blane Ohara, MD while in the presence of Blane Ohara, MD.   An After Visit Summary was printed and given to the patient.  Blane Ohara, MD [facility] [phone]

## 2023-09-09 ENCOUNTER — Ambulatory Visit (INDEPENDENT_AMBULATORY_CARE_PROVIDER_SITE_OTHER): Payer: Medicare Other | Admitting: Family Medicine

## 2023-09-09 ENCOUNTER — Telehealth: Payer: Self-pay

## 2023-09-09 VITALS — BP 124/56 | HR 78 | Temp 97.8°F | Ht 65.0 in | Wt 153.0 lb

## 2023-09-09 DIAGNOSIS — R300 Dysuria: Secondary | ICD-10-CM

## 2023-09-09 DIAGNOSIS — I952 Hypotension due to drugs: Secondary | ICD-10-CM

## 2023-09-09 DIAGNOSIS — I110 Hypertensive heart disease with heart failure: Secondary | ICD-10-CM | POA: Diagnosis not present

## 2023-09-09 DIAGNOSIS — I5042 Chronic combined systolic (congestive) and diastolic (congestive) heart failure: Secondary | ICD-10-CM | POA: Diagnosis not present

## 2023-09-09 DIAGNOSIS — I9589 Other hypotension: Secondary | ICD-10-CM | POA: Diagnosis not present

## 2023-09-09 DIAGNOSIS — I639 Cerebral infarction, unspecified: Secondary | ICD-10-CM | POA: Insufficient documentation

## 2023-09-09 LAB — POCT URINALYSIS DIP (CLINITEK)
Bilirubin, UA: NEGATIVE
Blood, UA: NEGATIVE
Glucose, UA: NEGATIVE mg/dL
Ketones, POC UA: NEGATIVE mg/dL
Nitrite, UA: NEGATIVE
POC PROTEIN,UA: NEGATIVE
Spec Grav, UA: 1.015 (ref 1.010–1.025)
Urobilinogen, UA: 0.2 U/dL
pH, UA: 5.5 (ref 5.0–8.0)

## 2023-09-09 NOTE — Telephone Encounter (Signed)
Copied from CRM [id number]. Topic: Clinical - Home Health Verbal Orders >> Sep 09, 2023  1:18 PM Donita Brooks wrote: Caller/Agency: [facility] Callback Number: [phone] Service Requested: Physical Therapy Frequency: 1week- 2 2 week- 3 1 week- 4  Any new concerns about the patient? No

## 2023-09-09 NOTE — Telephone Encounter (Signed)
Verbal was given, and also wanted to add nursing to the order for medication management

## 2023-09-10 LAB — URINE CULTURE

## 2023-09-15 ENCOUNTER — Encounter: Payer: Self-pay | Admitting: Family Medicine

## 2023-09-15 NOTE — Assessment & Plan Note (Signed)
BP improved.  Continue to hold chlorthalidone and amlodipine.  Continue valsartan 160 mg daily and torsemide 20 mg daily.

## 2023-09-15 NOTE — Assessment & Plan Note (Signed)
Resolved

## 2023-09-16 ENCOUNTER — Ambulatory Visit (INDEPENDENT_AMBULATORY_CARE_PROVIDER_SITE_OTHER): Payer: Medicare Other

## 2023-09-16 ENCOUNTER — Ambulatory Visit: Payer: Medicare Other

## 2023-09-16 DIAGNOSIS — Z0000 Encounter for general adult medical examination without abnormal findings: Secondary | ICD-10-CM | POA: Diagnosis not present

## 2023-09-16 LAB — OPHTHALMOLOGY REPORT-SCANNED

## 2023-09-16 NOTE — Patient Instructions (Signed)
[name] , Thank you for taking time to come for your Medicare Wellness Visit. I appreciate your ongoing commitment to your health goals. Please review the following plan we discussed and let me know if I can assist you in the future.    This is a list of the screening recommended for you and due dates:  Health Maintenance  Topic Date Due   DTaP/Tdap/Td vaccine (1 - Tdap) Never done   Colon Cancer Screening  Never done   Zoster (Shingles) Vaccine (1 of 2) Never done   Screening for Lung Cancer  03/02/2022   COVID-19 Vaccine (3 - 2024-25 season) 05/26/2023   Eye exam for diabetics  09/05/2023   Mammogram  06/09/2024*   Complete foot exam   12/06/2023   Yearly kidney health urinalysis for diabetes  01/02/2024   Hemoglobin A1C  01/29/2024   Yearly kidney function blood test for diabetes  07/31/2024   Medicare Annual Wellness Visit  09/15/2024   Pneumonia Vaccine  Completed   Flu Shot  Completed   DEXA scan (bone density measurement)  Completed   Hepatitis C Screening  Completed   HPV Vaccine  Aged Out  *Topic was postponed. The date shown is not the original due date.    Preventive Care 5 Years and Older, Female Preventive care refers to lifestyle choices and visits with your health care provider that can promote health and wellness. What does preventive care include? A yearly physical exam. This is also called an annual well check. Dental exams once or twice a year. Routine eye exams. Ask your health care provider how often you should have your eyes checked. Personal lifestyle choices, including: Daily care of your teeth and gums. Regular physical activity. Eating a healthy diet. Avoiding tobacco and drug use. Limiting alcohol use. Practicing safe sex. Taking low-dose aspirin every day. Taking vitamin and mineral supplements as recommended by your health care provider. What happens during an annual well check? The services and screenings done by your health care provider  during your annual well check will depend on your age, overall health, lifestyle risk factors, and family history of disease. Counseling  Your health care provider may ask you questions about your: Alcohol use. Tobacco use. Drug use. Emotional well-being. Home and relationship well-being. Sexual activity. Eating habits. History of falls. Memory and ability to understand (cognition). Work and work Astronomer. Reproductive health. Screening  You may have the following tests or measurements: Height, weight, and BMI. Blood pressure. Lipid and cholesterol levels. These may be checked every 5 years, or more frequently if you are over 31 years old. Skin check. Lung cancer screening. You may have this screening every year starting at age 69 if you have a 30-pack-year history of smoking and currently smoke or have quit within the past 15 years. Fecal occult blood test (FOBT) of the stool. You may have this test every year starting at age 6. Flexible sigmoidoscopy or colonoscopy. You may have a sigmoidoscopy every 5 years or a colonoscopy every 10 years starting at age 49. Hepatitis C blood test. Hepatitis B blood test. Sexually transmitted disease (STD) testing. Diabetes screening. This is done by checking your blood sugar (glucose) after you have not eaten for a while (fasting). You may have this done every 1-3 years. Bone density scan. This is done to screen for osteoporosis. You may have this done starting at age 39. Mammogram. This may be done every 1-2 years. Talk to your health care provider about how often  you should have regular mammograms. Talk with your health care provider about your test results, treatment options, and if necessary, the need for more tests. Vaccines  Your health care provider may recommend certain vaccines, such as: Influenza vaccine. This is recommended every year. Tetanus, diphtheria, and acellular pertussis (Tdap, Td) vaccine. You may need a Td booster every  10 years. Zoster vaccine. You may need this after age 34. Pneumococcal 13-valent conjugate (PCV13) vaccine. One dose is recommended after age 2. Pneumococcal polysaccharide (PPSV23) vaccine. One dose is recommended after age 55. Talk to your health care provider about which screenings and vaccines you need and how often you need them. This information is not intended to replace advice given to you by your health care provider. Make sure you discuss any questions you have with your health care provider. Document Released: 10/07/2015 Document Revised: 05/30/2016 Document Reviewed: 07/12/2015 Elsevier Interactive Patient Education  2017 [employer].  Fall Prevention in the Home Falls can cause injuries. They can happen to people of all ages. There are many things you can do to make your home safe and to help prevent falls. What can I do on the outside of my home? Regularly fix the edges of walkways and driveways and fix any cracks. Remove anything that might make you trip as you walk through a door, such as a raised step or threshold. Trim any bushes or trees on the path to your home. Use bright outdoor lighting. Clear any walking paths of anything that might make someone trip, such as rocks or tools. Regularly check to see if handrails are loose or broken. Make sure that both sides of any steps have handrails. Any raised decks and porches should have guardrails on the edges. Have any leaves, snow, or ice cleared regularly. Use sand or salt on walking paths during winter. Clean up any spills in your garage right away. This includes oil or grease spills. What can I do in the bathroom? Use night lights. Install grab bars by the toilet and in the tub and shower. Do not use towel bars as grab bars. Use non-skid mats or decals in the tub or shower. If you need to sit down in the shower, use a plastic, non-slip stool. Keep the floor dry. Clean up any water that spills on the floor as soon as it  happens. Remove soap buildup in the tub or shower regularly. Attach bath mats securely with double-sided non-slip rug tape. Do not have throw rugs and other things on the floor that can make you trip. What can I do in the bedroom? Use night lights. Make sure that you have a light by your bed that is easy to reach. Do not use any sheets or blankets that are too big for your bed. They should not hang down onto the floor. Have a firm chair that has side arms. You can use this for support while you get dressed. Do not have throw rugs and other things on the floor that can make you trip. What can I do in the kitchen? Clean up any spills right away. Avoid walking on wet floors. Keep items that you use a lot in easy-to-reach places. If you need to reach something above you, use a strong step stool that has a grab bar. Keep electrical cords out of the way. Do not use floor polish or wax that makes floors slippery. If you must use wax, use non-skid floor wax. Do not have throw rugs and other things on  the floor that can make you trip. What can I do with my stairs? Do not leave any items on the stairs. Make sure that there are handrails on both sides of the stairs and use them. Fix handrails that are broken or loose. Make sure that handrails are as long as the stairways. Check any carpeting to make sure that it is firmly attached to the stairs. Fix any carpet that is loose or worn. Avoid having throw rugs at the top or bottom of the stairs. If you do have throw rugs, attach them to the floor with carpet tape. Make sure that you have a light switch at the top of the stairs and the bottom of the stairs. If you do not have them, ask someone to add them for you. What else can I do to help prevent falls? Wear shoes that: Do not have high heels. Have rubber bottoms. Are comfortable and fit you well. Are closed at the toe. Do not wear sandals. If you use a stepladder: Make sure that it is fully opened.  Do not climb a closed stepladder. Make sure that both sides of the stepladder are locked into place. Ask someone to hold it for you, if possible. Clearly mark and make sure that you can see: Any grab bars or handrails. First and last steps. Where the edge of each step is. Use tools that help you move around (mobility aids) if they are needed. These include: Canes. Walkers. Scooters. Crutches. Turn on the lights when you go into a dark area. Replace any light bulbs as soon as they burn out. Set up your furniture so you have a clear path. Avoid moving your furniture around. If any of your floors are uneven, fix them. If there are any pets around you, be aware of where they are. Review your medicines with your doctor. Some medicines can make you feel dizzy. This can increase your chance of falling. Ask your doctor what other things that you can do to help prevent falls. This information is not intended to replace advice given to you by your health care provider. Make sure you discuss any questions you have with your health care provider. Document Released: 07/07/2009 Document Revised: 02/16/2016 Document Reviewed: 10/15/2014 Elsevier Interactive Patient Education  2017 [employer].

## 2023-09-16 NOTE — Progress Notes (Signed)
Subjective:   Sonya Small is a 72 y.o. female who presents for Medicare Annual (Subsequent) preventive examination.  This wellness visit is conducted by a nurse.  The patient's medications were reviewed and reconciled since the patient's last visit.  History details were provided by the patient.  The history appears to be reliable.    Medical History: Patient history and Family history was reviewed  Medications, Allergies, and preventative health maintenance was reviewed and updated.   Visit Complete: Virtual I connected with  Sonya Small on 09/16/23 by a audio enabled telemedicine application and verified that I am speaking with the correct person using two identifiers.  Patient Location: Home  Provider Location: Office/Clinic  I discussed the limitations of evaluation and management by telemedicine. The patient expressed understanding and agreed to proceed.  Vital Signs: Because this visit was a virtual/telehealth visit, some criteria may be missing or patient reported. Any vitals not documented were not able to be obtained and vitals that have been documented are patient reported.   Cardiac Risk Factors include: advanced age (>40men, >22 women);diabetes mellitus;smoking/ tobacco exposure Patient recently discharged from [facility] for stroke aborted by [facility].  She denies any deficients other than an occasional slur when speaking.     Objective:    There were no vitals filed for this visit. - Patient was unable to self-report due to a lack of equipment at home via telehealth  There is no height or weight on file to calculate BMI.     09/14/2022    9:05 AM 03/05/2022    7:41 AM 12/03/2019    2:15 PM 02/02/2019    1:00 AM 01/31/2019    3:00 PM 02/22/2015    9:57 AM 07/12/2014    6:49 PM  Advanced Directives  Does Patient Have a Medical Advance Directive? No No Yes  No No No  Type of Surveyor, minerals;Living will      Would patient like  information on creating a medical advance directive? No - Patient declined No - Patient declined  No - Patient declined  No - patient declined information No - patient declined information    Current Medications (verified) Outpatient Encounter Medications as of 09/16/2023  Medication Sig   albuterol (PROVENTIL) (2.5 MG/3ML) 0.083% nebulizer solution Take 3 mLs (2.5 mg total) by nebulization every 6 (six) hours as needed for wheezing or shortness of breath.   albuterol (VENTOLIN HFA) 108 (90 Base) MCG/ACT inhaler Inhale 2 puffs into the lungs every 4 (four) hours as needed for wheezing or shortness of breath.   ALPRAZolam (XANAX) 0.5 MG tablet TAKE 1 TABLET BY MOUTH THREE TIMES DAILY   amLODipine (NORVASC) 5 MG tablet TAKE 1 TABLET (5 MG TOTAL) BY MOUTH DAILY.   Aspirin-Acetaminophen (GOODYS BODY PAIN PO) Take 1 packet by mouth daily.   baclofen (LIORESAL) 10 MG tablet Take 10 mg by mouth 3 (three) times daily as needed.   blood glucose meter kit and supplies KIT Dispense based on patient and insurance preference. Use up to four times daily as directed.   buPROPion (WELLBUTRIN XL) 150 MG 24 hr tablet TAKE 1 TABLET BY MOUTH ONCE DAILY   chlorthalidone (HYGROTON) 25 MG tablet Take 0.5 tablets (12.5 mg total) by mouth daily.   clopidogrel (PLAVIX) 75 MG tablet TAKE 1 TABLET BY MOUTH EVERY MORNING   clotrimazole (MYCELEX) 10 MG troche TAKE ONE TABLET BY MOUTH five times daily   docusate sodium (COLACE) 100  MG capsule Take 100 mg by mouth 2 (two) times daily as needed for mild constipation.   ezetimibe (ZETIA) 10 MG tablet TAKE 1 TABLET BY MOUTH ONCE DAILY   fenofibrate 160 MG tablet TAKE 1 TABLET BY MOUTH EVERY MORNING   fluticasone (FLONASE) 50 MCG/ACT nasal spray INSTILL 1 SPRAY IN EACH NOSTRIL ONCE DAILY (Patient taking differently: Place 1 spray into both nostrils daily as needed for rhinitis.)   gabapentin (NEURONTIN) 300 MG capsule TAKE 1 CAPSULE BY MOUTH EVERY MORNING AND TAKE 1 CAPSULE EVERY  DAY AT BEDTIME   glucose blood (ONETOUCH ULTRA) test strip DISPENSE BASED ON PATIENT AND INSURANCE PREFERENCE. USE UP TO FOUR TIMES DAILY AS DIRECTED.   guaiFENesin (MUCINEX) 600 MG 12 hr tablet Take 1 tablet (600 mg total) by mouth 2 (two) times daily.   lamoTRIgine (LAMICTAL) 25 MG tablet TAKE 2 TABLETS BY MOUTH EVERY DAY AT BEDTIME   lansoprazole (PREVACID) 30 MG capsule TAKE ONE CAPSULE BY MOUTH BEFORE BREAKFAST   Levomilnacipran HCl ER (FETZIMA) 40 MG CP24 Take 40 mg by mouth daily.   montelukast (SINGULAIR) 10 MG tablet Take 10 mg by mouth daily.   MYRBETRIQ 50 MG TB24 tablet TAKE 1 TABLET BY MOUTH ONCE DAILY   naloxone (NARCAN) nasal spray 4 mg/0.1 mL    nitroGLYCERIN (NITROSTAT) 0.4 MG SL tablet Dissolve 1 tab under tongue as needed for chest pain. May repeat every 5 minutes x 2 doses. If no relief call 9-1-1.   oxyCODONE-acetaminophen (PERCOCET) 10-325 MG tablet Take 0.5 tablets by mouth in the morning and at bedtime.   rosuvastatin (CRESTOR) 20 MG tablet TAKE 1 TABLET BY MOUTH EVERY DAY AT BEDTIME   Semaglutide, 2 MG/DOSE, (OZEMPIC, 2 MG/DOSE,) 8 MG/3ML SOPN INJECT 2 MG SUBCUTANEOUSLY ONCE WEEKLY AS DIRECTED   torsemide (DEMADEX) 20 MG tablet TAKE 2 TABLETS BY MOUTH ONCE DAILY   TRELEGY ELLIPTA 200-62.5-25 MCG/ACT AEPB Inhale 1 puff into the lungs daily.   TRUEplus Lancets 30G MISC 1 each by Does not apply route 2 (two) times daily. E11.69   valACYclovir (VALTREX) 1000 MG tablet TAKE 2 TABLETS BY MOUTH TWICE DAILY AS NEEDED   valsartan (DIOVAN) 320 MG tablet Take 0.5 tablets (160 mg total) by mouth daily.   VASCEPA 1 g capsule TAKE TWO CAPSULES BY MOUTH TWICE DAILY   vitamin B-12 (CYANOCOBALAMIN) 50 MCG tablet Take 50 mcg by mouth daily.   Vitamin D, Ergocalciferol, (DRISDOL) 1.25 MG (50000 UNIT) CAPS capsule Take 1 capsule (50,000 Units total) by mouth every 7 (seven) days.   No facility-administered encounter medications on file as of 09/16/2023.    Allergies (verified) Latex  and Abilify [aripiprazole]   History: Past Medical History:  Diagnosis Date   Anxiety    CAD (coronary artery disease)    a. NSTEMI 11/2008 s/p DES to LCx (3.0x12 Xience); b. NSTEMI 01/2010 secondary to thrombotic RCA lesion (non-obstructive)-->med rx (integrilin x 24 hrs + plavix); c. 09/2012 negative Myoview.   Chronic diastolic CHF (congestive heart failure) (HCC)    a. 06/2014 Echo: EF 55-60%, no rwma, Gr1 DD, mild AI.   Depression    Dizziness    Drug induced constipation    Ganglion cyst of left foot 01/17/2021   Generalized hyperhidrosis    GERD (gastroesophageal reflux disease)    Headache    Hyperlipidemia    Hypertensive heart disease    Lumbar disc disease    Metabolic encephalopathy    Mixed hyperlipidemia    Myocardial infarction (HCC)  Obstructive sleep apnea    OP (osteoporosis)    Osteoarthritis    Osteoporosis    Other malaise    Overweight(278.02)    PAD (peripheral artery disease) (HCC)    a. Emboli to R foot 2010 from partially occlusive lesion in R EIA, s/p stenting. - followed by Dr. Arbie Cookey;  b. 10/2015 ABIs: R 1.03, L 0.97.   Restless leg    Sleep apnea    Stroke [facility])    TIA (transient ischemic attack)    Tobacco abuse    Urge incontinence    Past Surgical History:  Procedure Laterality Date   CYST EXCISION Left 01/2021   left foot   EYE SURGERY     at age 48   hysterectomy -age 4     ILIAC ARTERY STENT     RIGHT ILIAC STENT   KNEE ARTHROSCOPY     LEFT HEART CATH AND CORONARY ANGIOGRAPHY N/A 03/05/2022   Procedure: LEFT HEART CATH AND CORONARY ANGIOGRAPHY;  Surgeon: Corky Crafts, MD;  Location: [facility] INVASIVE CV LAB;  Service: Cardiovascular;  Laterality: N/A;   LITHOTRIPSY Left 12/2020   LUMBAR LAMINECTOMY     TUBAL LIGATION     Family History  Problem Relation Age of Onset   Alzheimer's disease Mother    Hodgkin's lymphoma Brother    Lung cancer Brother    Hepatitis C Brother    Hypothyroidism Brother    Hypertension Brother     Depression Brother    Heart disease Other        Grandfather   Social History   Socioeconomic History   Marital status: Divorced    Spouse name: Not on file   Number of children: 3   Years of education: 10 th   Highest education level: Not on file  Occupational History   Occupation: DISABLED    Employer: UNEMPLOYED  Tobacco Use   Smoking status: Every Day    Current packs/day: 0.00    Average packs/day: 2.0 packs/day for 54.0 years (108.0 ttl pk-yrs)    Types: Cigarettes    Start date: 11/20/1958    Last attempt to quit: 11/20/2012    Years since quitting: 10.8   Smokeless tobacco: Never   Tobacco comments:    2 ppd for many years.     04/13/2022 Patient smokes 1/2 pack daily or more if she is nervous  Substance and Sexual Activity   Alcohol use: No    Alcohol/week: 0.0 standard drinks of alcohol   Drug use: No   Sexual activity: Never  Other Topics Concern   Not on file  Social History Narrative   Patient is single with 3 children, 1 deceased.   Patient is right handed.   Patient has 10 th grade education.   Patient drinks 5 or more cups daily.   Social Drivers of Corporate investment banker Strain: Low Risk  (02/27/2023)   Overall Financial Resource Strain (CARDIA)    Difficulty of Paying Living Expenses: Not hard at all  Food Insecurity: No Food Insecurity (09/11/2023)   Received from [facility]   Hunger Vital Sign    Worried About Running Out of Food in the Last Year: Never true    Ran Out of Food in the Last Year: Never true  Transportation Needs: No Transportation Needs (09/11/2023)   Received from [facility] - Transportation    Lack of Transportation (Medical): No    Lack of Transportation (Non-Medical): No  Physical  Activity: Inactive (09/16/2023)   Exercise Vital Sign    Days of Exercise per Week: 0 days    Minutes of Exercise per Session: 0 min  Stress: No Stress Concern Present (09/11/2023)   Received from [facility] - Occupational Stress Questionnaire    Feeling of Stress : Not at all  Social Connections: Socially Isolated (09/16/2023)   Social Connection and Isolation Panel [NHANES]    Frequency of Communication with Friends and Family: More than three times a week    Frequency of Social Gatherings with Friends and Family: Once a week    Attends Religious Services: Never    Database administrator or Organizations: No    Attends Engineer, structural: Never    Marital Status: Divorced    Tobacco Counseling Ready to quit: Not Answered Counseling given: Not Answered Tobacco comments: 2 ppd for many years.  04/13/2022 Patient smokes 1/2 pack daily or more if she is nervous   Clinical Intake:  Pre-visit preparation completed: Yes  Pain : No/denies pain     BMI - recorded: 25.46 Nutritional Status: BMI 25 -29 Overweight Nutritional Risks: None Diabetes: Yes (most recent A1C 7.5) CBG done?: No  How often do you need to have someone help you when you read instructions, pamphlets, or other written materials from your doctor or pharmacy?: 1 - Never  Interpreter Needed?: No      Activities of Daily Living    09/16/2023    3:18 PM  In your present state of health, do you have any difficulty performing the following activities:  Hearing? 0  Vision? 0  Difficulty concentrating or making decisions? 1  Walking or climbing stairs? 0  Dressing or bathing? 0  Doing errands, shopping? 0  Preparing Food and eating ? N  Using the Toilet? N  In the past six months, have you accidently leaked urine? Y  Do you have problems with loss of bowel control? N  Managing your Medications? N  Managing your Finances? N  Housekeeping or managing your Housekeeping? N    Patient Care Team: Blane Ohara, MD as PCP - General (Family Medicine) [name], [name], MD as PCP - Cardiology (Cardiology) Marcellus Scott, MD as Referring Physician (Specialist)  Indicate any  recent Medical Services you may have received from other than [facility] providers in the past year (date may be approximate).     Assessment:   This is a routine wellness examination for Sonya Small.  Hearing/Vision screen No results found.   Goals Addressed   None    Depression Screen    09/16/2023    3:17 PM 09/09/2023    9:51 AM 06/10/2023   11:04 AM 04/29/2023   11:39 AM 01/22/2023    8:47 AM 09/14/2022    9:02 AM 08/28/2022    1:56 PM  PHQ 2/9 Scores  PHQ - 2 Score 0 0 0 6 3 6 6   PHQ- 9 Score 0 0  22 19 10 16     Fall Risk    09/16/2023    3:17 PM 06/10/2023   11:00 AM 04/29/2023   11:39 AM 01/22/2023    8:46 AM 12/06/2022   10:43 AM  Fall Risk   Falls in the past year? 1 1 1 1  0  Number falls in past yr: 1 1 1 1  0  Injury with Fall? 1 1 1   0  Risk for fall due to : History of fall(s) History of  fall(s) History of fall(s) History of fall(s) No Fall Risks  Follow up Falls evaluation completed Falls evaluation completed;Falls prevention discussed Falls evaluation completed;Falls prevention discussed Falls evaluation completed;Falls prevention discussed Falls evaluation completed    MEDICARE RISK AT HOME: Medicare Risk at Home Any stairs in or around the home?: Yes If so, are there any without handrails?: No Home free of loose throw rugs in walkways, pet beds, electrical cords, etc?: Yes Adequate lighting in your home to reduce risk of falls?: Yes Life alert?: No Use of a cane, walker or w/c?: No Grab bars in the bathroom?: No Shower chair or bench in shower?: No Elevated toilet seat or a handicapped toilet?: No  TIMED UP AND GO:  Was the test performed?  No    Cognitive Function:        09/16/2023    3:18 PM 09/14/2022    9:05 AM  6CIT Screen  What Year? 0 points 0 points  What month? 0 points 0 points  What time? 0 points 0 points  Count back from 20 0 points 0 points  Months in reverse 2 points 0 points  Repeat phrase 2 points 0 points  Total Score 4 points 0  points    Immunizations Immunization History  Administered Date(s) Administered   Fluad Quad(high Dose 65+) 08/29/2020, 06/02/2021, 09/12/2022   Fluad Trivalent(High Dose 65+) 06/10/2023   Influenza,inj,Quad PF,6+ Mos 07/13/2014   Influenza-Unspecified 05/29/2019   Moderna Sars-Covid-2 Vaccination 07/11/2020, 08/01/2020   PNEUMOCOCCAL CONJUGATE-20 09/12/2022   Pneumococcal Conjugate-13 01/20/2015   Pneumococcal Polysaccharide-23 06/26/2011    TDAP status: Due, Education has been provided regarding the importance of this vaccine. Advised may receive this vaccine at local pharmacy or Health Dept. Aware to provide a copy of the vaccination record if obtained from local pharmacy or Health Dept. Verbalized acceptance and understanding.  Flu Vaccine status: Up to date  Pneumococcal vaccine status: Up to date  Covid-19 vaccine status: Information provided on how to obtain vaccines.   Qualifies for Shingles Vaccine? Yes   Zostavax completed No   Shingrix Completed?: No.    Education has been provided regarding the importance of this vaccine. Patient has been advised to call insurance company to determine out of pocket expense if they have not yet received this vaccine. Advised may also receive vaccine at local pharmacy or Health Dept. Verbalized acceptance and understanding.  Screening Tests Health Maintenance  Topic Date Due   DTaP/Tdap/Td (1 - Tdap) Never done   Colonoscopy  Never done   Zoster Vaccines- Shingrix (1 of 2) Never done   Lung Cancer Screening  03/02/2022   COVID-19 Vaccine (3 - 2024-25 season) 05/26/2023   Medicare Annual Wellness (AWV)  09/15/2023   OPHTHALMOLOGY EXAM  09/05/2023   MAMMOGRAM  06/09/2024 (Originally 05/23/1999)   FOOT EXAM  12/06/2023   Diabetic kidney evaluation - Urine ACR  01/02/2024   HEMOGLOBIN A1C  01/29/2024   Diabetic kidney evaluation - eGFR measurement  07/31/2024   Pneumonia Vaccine 33+ Years old  Completed   INFLUENZA VACCINE  Completed    DEXA SCAN  Completed   Hepatitis C Screening  Completed   HPV VACCINES  Aged Out    Health Maintenance  Health Maintenance Due  Topic Date Due   DTaP/Tdap/Td (1 - Tdap) Never done   Colonoscopy  Never done   Zoster Vaccines- Shingrix (1 of 2) Never done   Lung Cancer Screening  03/02/2022   COVID-19 Vaccine (3 - 2024-25 season) 05/26/2023  Medicare Annual Wellness (AWV)  09/15/2023   OPHTHALMOLOGY EXAM  09/05/2023    Colorectal cancer screening: Declined by patient  Mammogram status: No longer required due to age.  Lung Cancer Screening: (Low Dose CT Chest recommended if Age 63-80 years, 20 pack-year currently smoking OR have quit w/in 15years.) does qualify.   Lung Cancer Screening Referral: patient declined  Additional Screening:  Vision Screening: Recommended annual ophthalmology exams for early detection of glaucoma and other disorders of the eye. Is the patient up to date with their annual eye exam?  Yes  Who is the provider or what is the name of the office in which the patient attends annual eye exams? Dr Dione Booze  Dental Screening: Recommended annual dental exams for proper oral hygiene   Community Resource Referral / Chronic Care Management: CRR required this visit?  No   CCM required this visit?  No     Plan:    1- Eye exam today with Dr Dione Booze 2- Has upcoming hospital follow-up 3- discussed recommendations for discontinuing Evenity due to recent stroke  I have personally reviewed and noted the following in the patient's chart:   Medical and social history Use of alcohol, tobacco or illicit drugs  Current medications and supplements including opioid prescriptions.  Functional ability and status Nutritional status Physical activity Advanced directives List of other physicians Hospitalizations, surgeries, and ER visits in previous 12 months Vitals Screenings to include cognitive, depression, and falls Referrals and appointments  In addition, I  have reviewed and discussed with patient certain preventive protocols, quality metrics, and best practice recommendations. A written personalized care plan for preventive services as well as general preventive health recommendations were provided to patient.     Jacklynn Bue, LPN   [id number]   After Visit Summary: (MyChart) Due to this being a telephonic visit, the after visit summary with patients personalized plan was offered to patient via MyChart

## 2023-09-19 ENCOUNTER — Telehealth: Payer: Self-pay

## 2023-09-19 NOTE — Telephone Encounter (Signed)
Verbal was given for Skilled nursing for cardio Vascular. To pam from [facility]

## 2023-09-20 ENCOUNTER — Ambulatory Visit: Payer: Medicare Other

## 2023-09-23 ENCOUNTER — Telehealth: Payer: Self-pay

## 2023-09-23 NOTE — Telephone Encounter (Signed)
Patient called and wanted to know when her appointment was and what time.   Gave patient information

## 2023-09-24 ENCOUNTER — Other Ambulatory Visit: Payer: Self-pay | Admitting: Family Medicine

## 2023-09-24 DIAGNOSIS — F332 Major depressive disorder, recurrent severe without psychotic features: Secondary | ICD-10-CM

## 2023-09-24 DIAGNOSIS — I110 Hypertensive heart disease with heart failure: Secondary | ICD-10-CM

## 2023-09-24 DIAGNOSIS — E1140 Type 2 diabetes mellitus with diabetic neuropathy, unspecified: Secondary | ICD-10-CM

## 2023-09-26 ENCOUNTER — Encounter: Payer: Self-pay | Admitting: Family Medicine

## 2023-09-26 ENCOUNTER — Other Ambulatory Visit: Payer: Self-pay | Admitting: Cardiology

## 2023-09-26 ENCOUNTER — Ambulatory Visit (INDEPENDENT_AMBULATORY_CARE_PROVIDER_SITE_OTHER): Payer: Medicare Other | Admitting: Family Medicine

## 2023-09-26 ENCOUNTER — Telehealth: Payer: Self-pay | Admitting: Neurology

## 2023-09-26 ENCOUNTER — Telehealth: Payer: Self-pay

## 2023-09-26 VITALS — BP 98/52 | HR 75 | Temp 96.5°F | Ht 65.0 in | Wt 158.0 lb

## 2023-09-26 DIAGNOSIS — E782 Mixed hyperlipidemia: Secondary | ICD-10-CM | POA: Diagnosis not present

## 2023-09-26 DIAGNOSIS — I959 Hypotension, unspecified: Secondary | ICD-10-CM

## 2023-09-26 DIAGNOSIS — E1140 Type 2 diabetes mellitus with diabetic neuropathy, unspecified: Secondary | ICD-10-CM

## 2023-09-26 DIAGNOSIS — F332 Major depressive disorder, recurrent severe without psychotic features: Secondary | ICD-10-CM

## 2023-09-26 DIAGNOSIS — I2510 Atherosclerotic heart disease of native coronary artery without angina pectoris: Secondary | ICD-10-CM

## 2023-09-26 DIAGNOSIS — I639 Cerebral infarction, unspecified: Secondary | ICD-10-CM

## 2023-09-26 NOTE — Progress Notes (Signed)
 Subjective:  Patient ID: Sonya Small, female    DOB: 1951/03/31  Age: 73 y.o. MRN: [id number]  Chief Complaint  Patient presents with   Hospitalization Follow-up    HPI   Follow up Hospitalization  Patient was admitted to [facility] on 09/09/2023 and discharged on 09/12/2023. She was treated for Stroke aborted by administration of thrombolytic agent .  Patient is a 73 year old white female with past medical history of hypertension hyperlipidemia, obstructive sleep apnea, peripheral artery disease, prior right MCA ischemic stroke, coronary artery disease/STEMI, tobacco abuse who presented from [facility] to [facility] for possible ischemic stroke and was given tPA on September 09, 2023.  Her initial symptoms were difficulty word finding and inability to speak.  The patient symptoms did significantly improve whether she does state that she still is having some issues with her speech. Initial CT scan of her brain showed no acute process. MRI of the brain showed no acute process suggestive of ischemic stroke or hemorrhage although it did show significant small vessel disease in bilateral hemispheres, and chronic bilateral cerebellar infarcts unchanged from September 03, 2023. CTA head/neck showed no critical stenosis or no LVO. TEE was normal. LDL is 67 A1c was 7.7.  Patient was recommended to be discharged to skilled nursing facility for rehab but she refused and wanted to go home.  She is currently has home health care in the form of physical therapy and skilled nursing.  Long-term secondary stroke prevention goals and recommendations: Blood pressure goal less than 130/80 LDL less than 70 A1c less than 7 Smoking cessation recommended Mediterranean diet/low-sodium diet Exercise 30 minutes a day for 5 days a week or as tolerated. Medical compliance.  Follow-up appointments with Dr. Sherre in 7 to 10 days and the [facility] on October 08, 2023.  Patient is  currently on Plavix .  She is on this prior to admission.  She is also on a statin medicine.  I do not see that they started aspirin  and I communicated with Dr. Ross and she suggested starting an 81 mg aspirin  to go with her Plavix  until she is seen by them.  At the time I did not see that she may have  an appointment at the stroke [place] clinic.  I did get her regular neurology appointment moved up to February 4.      09/16/2023    3:17 PM 09/09/2023    9:51 AM 06/10/2023   11:44 AM 06/10/2023   11:04 AM 04/29/2023   11:39 AM  Depression screen PHQ 2/9  Decreased Interest 0 0  0 3  Down, Depressed, Hopeless 0 0  0 3  PHQ - 2 Score 0 0  0 6  Altered sleeping 0 0 2  3  Tired, decreased energy 0 0 3  3  Change in appetite 0 0 0  1  Feeling bad or failure about yourself  0 0 3  3  Trouble concentrating 0 0 3  3  Moving slowly or fidgety/restless 0 0 0  3  Suicidal thoughts 0 0 0    PHQ-9 Score 0 0   22  Difficult doing work/chores Not difficult at all Not difficult at all Somewhat difficult  Somewhat difficult        09/16/2023    3:17 PM  Fall Risk   Falls in the past year? 1  Number falls in past yr: 1  Injury with Fall? 1  Risk for fall due to : History  of fall(s)  Follow up Falls evaluation completed    Patient Care Team: Sherre Clapper, MD as PCP - General (Family Medicine) Jordan, [name], MD as PCP - Cardiology (Cardiology) Chodri, Tanvir, MD as Referring Physician (Specialist)   Review of Systems  Constitutional:  Positive for fatigue. Negative for appetite change and fever.  HENT:  Negative for congestion, ear pain, sinus pressure and sore throat.   Respiratory:  Negative for cough, chest tightness, shortness of breath and wheezing.   Cardiovascular:  Negative for chest pain and palpitations.  Gastrointestinal:  Negative for abdominal pain, constipation, diarrhea, nausea and vomiting.  Genitourinary:  Negative for dysuria and hematuria.  Musculoskeletal:  Negative for  arthralgias, back pain, joint swelling and myalgias.  Skin:  Negative for rash.  Neurological:  Positive for dizziness and headaches. Negative for weakness.  Psychiatric/Behavioral:  Negative for dysphoric mood. The patient is not nervous/anxious.     Current Outpatient Medications on File Prior to Visit  Medication Sig Dispense Refill   albuterol  (PROVENTIL ) (2.5 MG/3ML) 0.083% nebulizer solution Take 3 mLs (2.5 mg total) by nebulization every 6 (six) hours as needed for wheezing or shortness of breath. 75 mL 2   albuterol  (VENTOLIN  HFA) 108 (90 Base) MCG/ACT inhaler Inhale 2 puffs into the lungs every 4 (four) hours as needed for wheezing or shortness of breath.     ALPRAZolam  (XANAX ) 0.5 MG tablet TAKE 1 TABLET BY MOUTH THREE TIMES DAILY 90 tablet 2   amLODipine  (NORVASC ) 5 MG tablet TAKE 1 TABLET (5 MG TOTAL) BY MOUTH DAILY. 90 tablet 0   Aspirin -Acetaminophen  (GOODYS BODY PAIN PO) Take 1 packet by mouth daily.     baclofen (LIORESAL) 10 MG tablet Take 10 mg by mouth 3 (three) times daily as needed.     blood glucose meter kit and supplies KIT Dispense based on patient and insurance preference. Use up to four times daily as directed. 1 each 0   buPROPion  (WELLBUTRIN  XL) 150 MG 24 hr tablet TAKE 1 TABLET BY MOUTH ONCE DAILY 30 tablet 2   clopidogrel  (PLAVIX ) 75 MG tablet TAKE 1 TABLET BY MOUTH EVERY MORNING 30 tablet 10   clotrimazole  (MYCELEX ) 10 MG troche TAKE ONE TABLET BY MOUTH five times daily 25 Troche 1   docusate sodium  (COLACE) 100 MG capsule Take 100 mg by mouth 2 (two) times daily as needed for mild constipation.     ezetimibe  (ZETIA ) 10 MG tablet TAKE 1 TABLET BY MOUTH ONCE DAILY 30 tablet 10   fenofibrate  160 MG tablet TAKE 1 TABLET BY MOUTH EVERY MORNING 30 tablet 10   fluticasone  (FLONASE ) 50 MCG/ACT nasal spray INSTILL 1 SPRAY IN EACH NOSTRIL ONCE DAILY (Patient taking differently: Place 1 spray into both nostrils daily as needed for rhinitis.) 16 g 1   gabapentin  (NEURONTIN )  300 MG capsule TAKE 1 CAPSULE BY MOUTH EVERY MORNING AND TAKE 1 CAPSULE EVERY DAY AT BEDTIME 60 capsule 10   glucose blood (ONETOUCH ULTRA) test strip DISPENSE BASED ON PATIENT AND INSURANCE PREFERENCE. USE UP TO FOUR TIMES DAILY AS DIRECTED. 400 strip 3   guaiFENesin  (MUCINEX ) 600 MG 12 hr tablet Take 1 tablet (600 mg total) by mouth 2 (two) times daily. 60 tablet 5   lamoTRIgine  (LAMICTAL ) 25 MG tablet TAKE 2 TABLETS BY MOUTH EVERY DAY AT BEDTIME 60 tablet 10   lansoprazole  (PREVACID ) 30 MG capsule TAKE ONE CAPSULE BY MOUTH BEFORE BREAKFAST 90 capsule 3   Levomilnacipran  HCl ER (FETZIMA ) 40 MG CP24 Take  40 mg by mouth daily. 90 capsule 0   montelukast  (SINGULAIR ) 10 MG tablet Take 10 mg by mouth daily.     MYRBETRIQ  50 MG TB24 tablet TAKE 1 TABLET BY MOUTH ONCE DAILY 30 tablet 10   naloxone (NARCAN) nasal spray 4 mg/0.1 mL      nitroGLYCERIN  (NITROSTAT ) 0.4 MG SL tablet Dissolve 1 tab under tongue as needed for chest pain. May repeat every 5 minutes x 2 doses. If no relief call 9-1-1. 25 tablet 2   oxyCODONE -acetaminophen  (PERCOCET) 10-325 MG tablet Take 0.5 tablets by mouth in the morning and at bedtime.     rosuvastatin  (CRESTOR ) 20 MG tablet TAKE 1 TABLET BY MOUTH EVERY DAY AT BEDTIME 30 tablet 10   Semaglutide , 2 MG/DOSE, (OZEMPIC , 2 MG/DOSE,) 8 MG/3ML SOPN INJECT 2 MG SUBCUTANEOUSLY ONCE WEEKLY AS DIRECTED 3 mL 2   torsemide  (DEMADEX ) 20 MG tablet TAKE 2 TABLETS BY MOUTH ONCE DAILY 60 tablet 2   TRELEGY ELLIPTA 200-62.5-25 MCG/ACT AEPB Inhale 1 puff into the lungs daily.     TRUEplus Lancets 30G MISC 1 each by Does not apply route 2 (two) times daily. E11.69 400 each 3   valACYclovir  (VALTREX ) 1000 MG tablet TAKE 2 TABLETS BY MOUTH TWICE DAILY AS NEEDED 20 tablet 2   valsartan  (DIOVAN ) 320 MG tablet Take 0.5 tablets (160 mg total) by mouth daily. 90 tablet 1   VASCEPA  1 g capsule TAKE TWO CAPSULES BY MOUTH TWICE DAILY 360 capsule 2   vitamin B-12 (CYANOCOBALAMIN) 50 MCG tablet Take 50 mcg  by mouth daily.     Vitamin D , Ergocalciferol , (DRISDOL ) 1.25 MG (50000 UNIT) CAPS capsule Take 1 capsule (50,000 Units total) by mouth every 7 (seven) days. 14 capsule 0   No current facility-administered medications on file prior to visit.   Past Medical History:  Diagnosis Date   Anxiety    CAD (coronary artery disease)    a. NSTEMI 11/2008 s/p DES to LCx (3.0x12 Xience); b. NSTEMI 01/2010 secondary to thrombotic RCA lesion (non-obstructive)-->med rx (integrilin x 24 hrs + plavix ); c. 09/2012 negative Myoview .   Chronic diastolic CHF (congestive heart failure) (HCC)    a. 06/2014 Echo: EF 55-60%, no rwma, Gr1 DD, mild AI.   Depression    Dizziness    Drug induced constipation    Ganglion cyst of left foot 01/17/2021   Generalized hyperhidrosis    GERD (gastroesophageal reflux disease)    Headache    Hyperlipidemia    Hypertensive heart disease    Lumbar disc disease    Metabolic encephalopathy    Mixed hyperlipidemia    Myocardial infarction (HCC)    Obstructive sleep apnea    OP (osteoporosis)    Osteoarthritis    Osteoporosis    Other malaise    Overweight(278.02)    PAD (peripheral artery disease) (HCC)    a. Emboli to R foot 2010 from partially occlusive lesion in R EIA, s/p stenting. - followed by Dr. Oris;  b. 10/2015 ABIs: R 1.03, L 0.97.   Restless leg    Sleep apnea    Stroke [facility])    TIA (transient ischemic attack)    Tobacco abuse    Urge incontinence    Past Surgical History:  Procedure Laterality Date   CYST EXCISION Left 01/2021   left foot   EYE SURGERY     at age 55   hysterectomy -age 49     ILIAC ARTERY STENT     RIGHT ILIAC STENT  KNEE ARTHROSCOPY     LEFT HEART CATH AND CORONARY ANGIOGRAPHY N/A 03/05/2022   Procedure: LEFT HEART CATH AND CORONARY ANGIOGRAPHY;  Surgeon: Dann Candyce RAMAN, MD;  Location: [facility] INVASIVE CV LAB;  Service: Cardiovascular;  Laterality: N/A;   LITHOTRIPSY Left 12/2020   LUMBAR LAMINECTOMY     TUBAL LIGATION       Family History  Problem Relation Age of Onset   Alzheimer's disease Mother    Hodgkin's lymphoma Brother    Lung cancer Brother    Hepatitis C Brother    Hypothyroidism Brother    Hypertension Brother    Depression Brother    Heart disease Other        Grandfather   Social History   Socioeconomic History   Marital status: Divorced    Spouse name: Not on file   Number of children: 3   Years of education: 10 th   Highest education level: Not on file  Occupational History   Occupation: DISABLED    Employer: UNEMPLOYED  Tobacco Use   Smoking status: Every Day    Current packs/day: 0.00    Average packs/day: 2.0 packs/day for 54.0 years (108.0 ttl pk-yrs)    Types: Cigarettes    Start date: 11/20/1958    Last attempt to quit: 11/20/2012    Years since quitting: 10.8   Smokeless tobacco: Never   Tobacco comments:    2 ppd for many years.     04/13/2022 Patient smokes 1/2 pack daily or more if she is nervous  Substance and Sexual Activity   Alcohol use: No    Alcohol/week: 0.0 standard drinks of alcohol   Drug use: No   Sexual activity: Never  Other Topics Concern   Not on file  Social History Narrative   Patient is single with 3 children, 1 deceased.   Patient is right handed.   Patient has 10 th grade education.   Patient drinks 5 or more cups daily.   Social Drivers of Corporate Investment Banker Strain: Low Risk  (02/27/2023)   Overall Financial Resource Strain (CARDIA)    Difficulty of Paying Living Expenses: Not hard at all  Food Insecurity: No Food Insecurity (09/11/2023)   Received from [facility]   Hunger Vital Sign    Worried About Running Out of Food in the Last Year: Never true    Ran Out of Food in the Last Year: Never true  Transportation Needs: No Transportation Needs (09/11/2023)   Received from [facility] - Transportation    Lack of Transportation (Medical): No    Lack of Transportation (Non-Medical): No  Physical Activity:  Inactive (09/16/2023)   Exercise Vital Sign    Days of Exercise per Week: 0 days    Minutes of Exercise per Session: 0 min  Stress: No Stress Concern Present (09/11/2023)   Received from [facility] of Occupational Health - Occupational Stress Questionnaire    Feeling of Stress : Not at all  Social Connections: Socially Isolated (09/16/2023)   Social Connection and Isolation Panel [NHANES]    Frequency of Communication with Friends and Family: More than three times a week    Frequency of Social Gatherings with Friends and Family: Once a week    Attends Religious Services: Never    Database Administrator or Organizations: No    Attends Banker Meetings: Never    Marital Status: Divorced    Objective:  BP ROLLEN)  98/52 (BP Location: Right Arm, Patient Position: Sitting)   Pulse 75   Temp (!) 96.5 F (35.8 C) (Temporal)   Ht 5' 5 (1.651 m)   Wt 158 lb (71.7 kg)   SpO2 98%   BMI 26.29 kg/m      09/26/2023    2:30 PM 09/09/2023    9:45 AM 09/06/2023   10:53 AM  BP/Weight  Systolic BP 98 124 80  Diastolic BP 52 56 44  Wt. (Lbs) 158 153   BMI 26.29 kg/m2 25.46 kg/m2     Physical Exam Vitals reviewed.  Constitutional:      Appearance: Normal appearance. She is normal weight.  Neck:     Vascular: No carotid bruit.  Cardiovascular:     Rate and Rhythm: Normal rate and regular rhythm.     Heart sounds: Normal heart sounds.  Pulmonary:     Effort: Pulmonary effort is normal. No respiratory distress.     Breath sounds: Normal breath sounds.  Abdominal:     General: Abdomen is flat. Bowel sounds are normal.     Palpations: Abdomen is soft.     Tenderness: There is no abdominal tenderness.  Neurological:     Mental Status: She is alert and oriented to person, place, and time.     Cranial Nerves: No cranial nerve deficit.     Motor: No weakness.     Coordination: Coordination normal.     Gait: Gait normal.  Psychiatric:        Mood and  Affect: Mood normal.        Behavior: Behavior normal.     Diabetic Foot Exam - Simple   No data filed      Lab Results  Component Value Date   WBC 9.7 08/01/2023   HGB 12.7 08/01/2023   HCT 40.5 08/01/2023   PLT 247 08/01/2023   GLUCOSE 125 (H) 08/01/2023   CHOL 111 08/01/2023   TRIG 85 08/01/2023   HDL 40 08/01/2023   LDLCALC 54 08/01/2023   ALT 17 08/01/2023   AST 18 08/01/2023   NA 147 (H) 08/01/2023   K 5.2 08/01/2023   CL 107 (H) 08/01/2023   CREATININE 0.91 08/01/2023   BUN 32 (H) 08/01/2023   CO2 26 08/01/2023   TSH 0.734 04/26/2023   INR 1.0 01/31/2019   HGBA1C 7.5 (H) 08/01/2023   MICROALBUR neg 02/27/2021      Assessment & Plan:    Stroke aborted by administration of thrombolytic agent (HCC)  Type 2 diabetes mellitus with diabetic neuropathy, without long-term current use of insulin  (HCC) Assessment & Plan: Control: not quite at goal, but improved Recommend check sugars twice daily. Recommend check feet daily. Recommend annual eye exams. Medicines:  Continue to work on eating a healthy diet and exercise.  Labs drawn today.       Mixed hyperlipidemia Assessment & Plan: Well controlled.  No changes to medicines. Continue fenofibrate  160 mg daily, rosuvastatin  20 mg daily. Vascepa  1 gm 2 capsules twice daily, and zetia  10 mg daily.   Continue to work on eating a healthy diet and exercise.      Coronary artery disease involving native coronary artery of native heart without angina pectoris Assessment & Plan: Management per specialist.  Continue plavix  and crestor , fenofibrate , and zetia .   Hypotension, unspecified hypotension type Assessment & Plan: BP is too low. Hold amlodipine . Check bp twice daily.  Continue torsemide  and valsartan .   Severe episode of recurrent major depressive disorder,  without psychotic features [facility]) Assessment & Plan: Not at goal, but patient does not wish to pursue counseling. Continue fetzima , lamictal ,  wellbutrin  xl, and Xanax .       No orders of the defined types were placed in this encounter.   No orders of the defined types were placed in this encounter.    Follow-up: Return in about 6 weeks (around 11/07/2023) for chronic follow up.   I,Lauren M Auman,acting as a scribe for Abigail Free, MD.,have documented all relevant documentation on the behalf of Abigail Free, MD,as directed by  Abigail Free, MD while in the presence of Abigail Free, MD.   An After Visit Summary was printed and given to the patient.  I attest that I have reviewed this visit and agree with the plan scribed by my staff.   Abigail Free, MD [facility] [phone]

## 2023-09-26 NOTE — Telephone Encounter (Signed)
 Patient has been re-scheduled with [facility] for 10/29/23 arriving at 10 am for a 1030 am appointment. Patient was also advised to start a Asprin 81 mg 1 tablet once daily per Dr. Sedalia Muta until she see's neurology.

## 2023-09-26 NOTE — Telephone Encounter (Signed)
 [name] called to report pt recent stay in hospital for stroke .  Rep from [facility] reported that Dr Sherre wants pt to see Dr Onita.  New Pt referrals made 02-04 available for pt to have consult slot.  The suggestion was made by Harlene, NP that Dr [name] secure message via epic Dr Onita re: concerns since she Young, NP) has never seen pt. This is FYI

## 2023-09-27 ENCOUNTER — Encounter: Payer: Self-pay | Admitting: Family Medicine

## 2023-09-27 ENCOUNTER — Other Ambulatory Visit: Payer: Self-pay | Admitting: Family Medicine

## 2023-09-27 NOTE — Assessment & Plan Note (Addendum)
 Control: not quite at goal, but improved Recommend check sugars twice daily. Recommend check feet daily. Recommend annual eye exams. Medicines:  Continue to work on eating a healthy diet and exercise.  Labs drawn today.

## 2023-09-27 NOTE — Assessment & Plan Note (Signed)
Well controlled.  No changes to medicines. Continue fenofibrate 160 mg daily, rosuvastatin 20 mg daily. Vascepa 1 gm 2 capsules twice daily, and zetia 10 mg daily.   Continue to work on eating a healthy diet and exercise.

## 2023-09-27 NOTE — Assessment & Plan Note (Signed)
 Not at goal, but patient does not wish to pursue counseling. Continue fetzima, lamictal, wellbutrin xl, and Xanax.

## 2023-09-27 NOTE — Assessment & Plan Note (Signed)
 Management per specialist.  Continue plavix and crestor, fenofibrate, and zetia.

## 2023-09-27 NOTE — Assessment & Plan Note (Addendum)
 BP is too low. Hold amlodipine. Check bp twice daily.  Continue torsemide and valsartan.

## 2023-09-30 ENCOUNTER — Telehealth: Payer: Self-pay

## 2023-09-30 NOTE — Telephone Encounter (Signed)
 Physical therapy orders approved by Dr Sirivol. I left detailed message to [employer].  Copied from CRM [id number]. Topic: Clinical - Home Health Verbal Orders >> Sep 30, 2023  3:32 PM Monisha R wrote: Caller/Agency: Oneil Biles [facility] Callback Number: [phone] Service Requested: Physical Therapy  Frequency: 1x a week for 5 weeks starting September 26, 2023!  If call back and no answer voicemail is secure to receive verbal answer as well.

## 2023-10-02 ENCOUNTER — Telehealth: Payer: Self-pay

## 2023-10-02 ENCOUNTER — Telehealth: Payer: Self-pay | Admitting: Family Medicine

## 2023-10-02 NOTE — Telephone Encounter (Signed)
 [facility] - Client Coordination Note Report

## 2023-10-02 NOTE — Telephone Encounter (Signed)
 Per Camie Moats, PA-C: After seeing patient Blood Pressure reading after stopping amlodipine  5 mg since Saturday Blood pressure top number has been elevated. Recommend patient starting back on Medication and keeping checking her blood pressure and coming in for a nurse visit in two weeks for blood pressure check.   Patient Made Aware, Also stated for patient to call office if she starts getting low blood pressure after starting back on the medication, Verbalized Understanding. Appointment made for nurse visit

## 2023-10-03 ENCOUNTER — Telehealth: Payer: Self-pay

## 2023-10-07 DIAGNOSIS — I69320 Aphasia following cerebral infarction: Secondary | ICD-10-CM | POA: Diagnosis not present

## 2023-10-07 DIAGNOSIS — E1151 Type 2 diabetes mellitus with diabetic peripheral angiopathy without gangrene: Secondary | ICD-10-CM | POA: Diagnosis not present

## 2023-10-07 DIAGNOSIS — I110 Hypertensive heart disease with heart failure: Secondary | ICD-10-CM

## 2023-10-07 DIAGNOSIS — F32A Depression, unspecified: Secondary | ICD-10-CM

## 2023-10-07 DIAGNOSIS — I69398 Other sequelae of cerebral infarction: Secondary | ICD-10-CM | POA: Diagnosis not present

## 2023-10-07 DIAGNOSIS — M51369 Other intervertebral disc degeneration, lumbar region without mention of lumbar back pain or lower extremity pain: Secondary | ICD-10-CM

## 2023-10-07 DIAGNOSIS — M810 Age-related osteoporosis without current pathological fracture: Secondary | ICD-10-CM

## 2023-10-07 DIAGNOSIS — I2510 Atherosclerotic heart disease of native coronary artery without angina pectoris: Secondary | ICD-10-CM

## 2023-10-07 DIAGNOSIS — R631 Polydipsia: Secondary | ICD-10-CM | POA: Diagnosis not present

## 2023-10-07 DIAGNOSIS — F419 Anxiety disorder, unspecified: Secondary | ICD-10-CM

## 2023-10-07 DIAGNOSIS — E1165 Type 2 diabetes mellitus with hyperglycemia: Secondary | ICD-10-CM

## 2023-10-07 DIAGNOSIS — I5032 Chronic diastolic (congestive) heart failure: Secondary | ICD-10-CM

## 2023-10-09 ENCOUNTER — Telehealth: Payer: Self-pay | Admitting: Neurology

## 2023-10-09 NOTE — Telephone Encounter (Signed)
 Pt called to confirm appointment details

## 2023-10-16 ENCOUNTER — Ambulatory Visit: Payer: Medicare Other

## 2023-10-21 ENCOUNTER — Ambulatory Visit: Payer: Medicare Other

## 2023-10-21 VITALS — BP 110/62

## 2023-10-21 DIAGNOSIS — I110 Hypertensive heart disease with heart failure: Secondary | ICD-10-CM

## 2023-10-21 NOTE — Progress Notes (Signed)
Patient came in for BP recheck today and also brought in her readings that read the highest being 138/71 and lowest being 101/61. Patient also brought in BP readings which have been placed in the box to be scanned. Told patient to follow up as scheduled.

## 2023-10-25 ENCOUNTER — Other Ambulatory Visit: Payer: Self-pay | Admitting: Family Medicine

## 2023-10-28 ENCOUNTER — Telehealth: Payer: Self-pay

## 2023-10-28 NOTE — Telephone Encounter (Signed)
Spoke with Sonya Small gave VO for order below, he also stated her Neurologist wanted her to have a quad cane and asked if we could send over a DME order. Order will be sent to [facility].  Copied from CRM [id number]. Topic: Clinical - Home Health Verbal Orders >> Oct 28, 2023  8:10 AM Sonya Small wrote: Caller/Agency: Mark Bias with [facility] Callback Number: [phone] - Molli Knock to leave message Service Requested: Physical Therapy Frequency: 1 time a week for 3 weeks starting on 10/27/2023 Any new concerns about the patient? Yes, left shoulder and neck pain

## 2023-10-29 ENCOUNTER — Telehealth: Payer: Self-pay | Admitting: Family Medicine

## 2023-10-29 ENCOUNTER — Encounter: Payer: Self-pay | Admitting: Neurology

## 2023-10-29 ENCOUNTER — Ambulatory Visit (INDEPENDENT_AMBULATORY_CARE_PROVIDER_SITE_OTHER): Payer: Medicare Other | Admitting: Neurology

## 2023-10-29 VITALS — BP 96/45 | HR 74 | Ht 65.0 in | Wt 158.1 lb

## 2023-10-29 DIAGNOSIS — G4710 Hypersomnia, unspecified: Secondary | ICD-10-CM

## 2023-10-29 DIAGNOSIS — R410 Disorientation, unspecified: Secondary | ICD-10-CM | POA: Insufficient documentation

## 2023-10-29 DIAGNOSIS — R269 Unspecified abnormalities of gait and mobility: Secondary | ICD-10-CM | POA: Diagnosis not present

## 2023-10-29 DIAGNOSIS — I679 Cerebrovascular disease, unspecified: Secondary | ICD-10-CM | POA: Diagnosis not present

## 2023-10-29 NOTE — Progress Notes (Signed)
 Chief Complaint  Patient presents with   Cerebrovascular Accident    Rm14, sister present, stroke f/u:pt stated she's more fatigued, dyspnea on exertion, and shaky all over      ASSESSMENT AND PLAN  Sonya Small is a 73 y.o. female   Advanced cerebrovascular disease Peripheral vascular disease,  MRI of the brain from [facility] imaging in January 2024 showed extensive periventricular small vessel disease, no acute abnormality,  She did receive IV tPA on September 09, 2023 at [facility] presenting with slurred speech, lasting couple hours, MRI of the brain showed no acute event,  She certainly has multiple vascular risk factor including hypertension, hyperlipidemia, diabetes, peripheral vascular disease, longtime smoker,  Is on Plavix ,  Ultra sound of carotid artery showed no large vessel disease, echocardiogram showed no significant abnormality  Excessive sleepiness, vivid dreams, confusion  Under the care of outside sleep specialist, difficulty compliant with her CPAP machine,  Repeat EEG   Cognitive impairment  MoCA examination is only 19/30 today,  Her complains of excessive drowsiness, intermittent increased confusion could due to underlying cognitive impairment, likely a combination of central nervous system degenerative disorder with vascular component, untreated obstructive sleep apnea,  Unsteady gait:  Getting physical therapy    Continue follow-up with her primary care physician, optimize her vascular risk factor control, we will inform patient EEG result, only return to clinic if there is abnormality found  DIAGNOSTIC DATA (LABS, IMAGING, TESTING) - I reviewed patient records, labs, notes, testing and imaging myself where available.   MEDICAL HISTORY:  Sonya Small is a 73 year old female, seen in request by her primary care physician Dr. Sherre Clapper, for evaluation of abnormal MRI of the brain, excessive sleepiness, accompanied by her sister at visit.    I reviewed and summarized the referring note. PMHX. CAD HTN DM HLD COPD 1ppd Peripheral vascular disease. Lumbar decompression surgery.  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 Terrall  She had MRI of the brain [facility] imaging in January 2024 showed extensive periventricular small vessel disease, no acute abnormality,  UPDATE Feb 4th 2025:  Since last visit, she presented to emergency room twice, December 10, her sister found she had slurred speech, excessive sleepiness,    MRI of the brain Dec 10th 2024, showed no acute abnormality, 1.  No acute intracranial abnormality identified.  2.  Moderate to severe small vessel ischemic disease changes.  3.  Old left cerebellar lacunar infarct.   Then emergency room present patient again but her sister found her slurred speech, confused on September 09, 2023, was treated at [facility], treated for possible ischemic stroke, received IV tPA, ICU admission following that, her symptoms significantly improved within few hours, back to baseline,  MRI of the brain showed no acute intracranial abnormality, advanced small  vessel disease, chronic bilateral cerebellar infarction, no change from previous scan  MRI cervical on Sep 03 2023 T2 signal hyperintensity around/involving right  facet joints at level of C4-5 and C5-6. This is concerning for edema/inflammation in the area.   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 No obvious cord compression.   ECHO, normal ejection fraction, no wall motion abnormality, PHYSICAL EXAM:   Vitals:   10/29/23 1034  BP: (!) 96/45  Pulse: 74  Weight: 158 lb 1.1 oz (71.7 kg)  Height: 5' 5 (1.651 m)   Body mass index is 26.3 kg/m.  PHYSICAL EXAMNIATION:  Gen: NAD, conversant, well nourised, well groomed                     Cardiovascular: Regular rate rhythm, no peripheral edema, warm, nontender. Eyes: Conjunctivae clear without exudates or hemorrhage Neck: Supple, no carotid bruits. Pulmonary: Clear to auscultation bilaterally   NEUROLOGICAL EXAM:  MENTAL STATUS: Speech/cognition: Depressed looking elderly female awake, alert, oriented to history taking and casual conversation    10/29/2023   11:31 AM  Montreal Cognitive Assessment   Visuospatial/ Executive (0/5) 3  Naming (0/3) 3  Attention: Read list of digits (0/2) 2  Attention: Read list of letters (0/1) 1  Attention: Serial 7 subtraction starting at 100 (0/3) 1  Language: Repeat phrase (0/2) 1  Language : Fluency (0/1) 1  Abstraction (0/2) 1  Delayed Recall (0/5) 1  Orientation (0/6) 5  Total 19    CRANIAL NERVES: CN II: Visual fields are full to confrontation. Pupils are round equal and briskly reactive to light. CN III, IV, VI: extraocular movement are normal. No ptosis. CN V: Facial sensation is intact to light touch CN VII: Face is symmetric with normal eye closure  CN VIII: Hearing is normal to causal conversation. CN IX, X: Phonation is normal. CN XI: Head turning and shoulder shrug are intact  MOTOR: There is no pronator drift of out-stretched arms. Muscle bulk and tone are normal. Muscle strength is normal.  REFLEXES: Reflexes are 1 and symmetric at the biceps, triceps, knees, and ankles.  Plantar responses are flexor.  SENSORY: Intact to light touch, pinprick and vibratory sensation are intact in fingers and toes.  COORDINATION: There is no trunk or limb dysmetria noted.  GAIT/STANCE: She needs push-up to get up from seated position, mildly antalgic,  REVIEW OF SYSTEMS:  Full 14 system review of systems performed and notable only for as above All other review of systems were negative.   ALLERGIES: Allergies  Allergen Reactions   Latex Rash   Abilify  [Aripiprazole ]     confusion    HOME MEDICATIONS: Current Outpatient Medications  Medication Sig Dispense Refill   albuterol  (PROVENTIL ) (2.5 MG/3ML) 0.083% nebulizer solution Take 3 mLs (2.5 mg total) by nebulization every 6 (six) hours as needed for wheezing or shortness of breath. 75 mL 2   albuterol  (VENTOLIN  HFA) 108 (90 Base) MCG/ACT inhaler Inhale 2 puffs into the lungs every 4 (four) hours as needed for wheezing or shortness of breath.     ALPRAZolam  (XANAX ) 0.5 MG tablet TAKE 1 TABLET BY MOUTH THREE TIMES DAILY 90 tablet 2   amLODipine  (NORVASC ) 5 MG tablet TAKE 1 TABLET BY MOUTH DAILY 30 tablet 3   Aspirin -Acetaminophen  (GOODYS BODY PAIN PO) Take 1 packet by mouth daily.     baclofen (LIORESAL) 10 MG tablet Take 10 mg by mouth 3 (three) times  daily as needed.     blood glucose meter kit and supplies KIT Dispense based on patient and insurance preference. Use up to four times daily as directed. 1 each 0   buPROPion  (WELLBUTRIN  XL) 150 MG 24 hr tablet TAKE 1 TABLET BY MOUTH ONCE DAILY 30 tablet 2   chlorthalidone  (HYGROTON ) 25 MG tablet TAKE 1/2 TABLET BY MOUTH EVERY DAY 45 tablet 3   clopidogrel  (PLAVIX ) 75 MG tablet TAKE 1 TABLET BY MOUTH EVERY MORNING 30 tablet 10   ezetimibe  (ZETIA ) 10 MG tablet TAKE 1 TABLET BY MOUTH ONCE DAILY 30 tablet 10   fenofibrate  160 MG tablet TAKE 1 TABLET BY MOUTH EVERY MORNING 30 tablet 10   gabapentin  (NEURONTIN ) 300 MG capsule TAKE 1 CAPSULE BY MOUTH EVERY MORNING AND TAKE 1  CAPSULE EVERY DAY AT BEDTIME 60 capsule 10   glucose blood (ONETOUCH ULTRA) test strip DISPENSE BASED ON PATIENT AND INSURANCE PREFERENCE. USE UP TO FOUR TIMES DAILY AS DIRECTED. 400 strip 3   lamoTRIgine  (LAMICTAL ) 25 MG tablet TAKE 2 TABLETS BY MOUTH EVERY DAY AT BEDTIME 60 tablet 10   lansoprazole  (PREVACID ) 30 MG capsule TAKE ONE CAPSULE BY MOUTH BEFORE BREAKFAST 90 capsule 3   MYRBETRIQ  50 MG TB24 tablet TAKE 1 TABLET BY MOUTH ONCE DAILY 30 tablet 10   naloxone (NARCAN) nasal spray 4 mg/0.1 mL      nitroGLYCERIN  (NITROSTAT ) 0.4 MG SL tablet Dissolve 1 tab under tongue as needed for chest pain. May repeat every 5 minutes x 2 doses. If no relief call 9-1-1. 25 tablet 2   oxyCODONE -acetaminophen  (PERCOCET) 10-325 MG tablet Take 0.5 tablets by mouth in the morning and at bedtime.     rosuvastatin  (CRESTOR ) 20 MG tablet TAKE 1 TABLET BY MOUTH EVERY DAY AT BEDTIME 30 tablet 10   Semaglutide , 2 MG/DOSE, (OZEMPIC , 2 MG/DOSE,) 8 MG/3ML SOPN INJECT 2 MG SUBCUTANEOUSLY ONCE WEEKLY AS DIRECTED 3 mL 2   torsemide  (DEMADEX ) 20 MG tablet TAKE 2 TABLETS BY MOUTH ONCE DAILY 60 tablet 2   TRELEGY ELLIPTA 200-62.5-25 MCG/ACT AEPB Inhale 1 puff into the lungs daily.     TRUEplus Lancets 30G MISC 1 each by Does not apply route 2 (two) times daily. E11.69 400 each 3   valACYclovir  (VALTREX ) 1000 MG tablet TAKE 2 TABLETS BY MOUTH TWICE DAILY AS NEEDED 20 tablet 2   valsartan  (DIOVAN ) 320 MG tablet Take 0.5 tablets (160 mg total) by mouth daily. 90 tablet 1   VASCEPA  1 g capsule TAKE TWO CAPSULES BY MOUTH TWICE DAILY 360 capsule 2   No current facility-administered medications for this visit.    PAST MEDICAL HISTORY: Past Medical History:  Diagnosis Date   Anxiety    CAD (coronary artery disease)    a. NSTEMI 11/2008 s/p DES to LCx (3.0x12 Xience); b. NSTEMI 01/2010 secondary to thrombotic RCA lesion (non-obstructive)-->med rx (integrilin x 24 hrs + plavix ); c. 09/2012 negative Myoview .   Chronic diastolic CHF  (congestive heart failure) (HCC)    a. 06/2014 Echo: EF 55-60%, no rwma, Gr1 DD, mild AI.   Depression    Dizziness    Drug induced constipation    Ganglion cyst of left foot 01/17/2021   Generalized hyperhidrosis    GERD (gastroesophageal reflux disease)    Headache    Hyperlipidemia    Hypertensive heart disease    Lumbar disc disease    Metabolic encephalopathy    Mixed hyperlipidemia    Myocardial infarction (HCC)    Obstructive sleep  apnea    OP (osteoporosis)    Osteoarthritis    Osteoporosis    Other malaise    Overweight(278.02)    PAD (peripheral artery disease) (HCC)    a. Emboli to R foot 2010 from partially occlusive lesion in R EIA, s/p stenting. - followed by Dr. Oris;  b. 10/2015 ABIs: R 1.03, L 0.97.   Restless leg    Sleep apnea    Stroke (HCC)    TIA (transient ischemic attack)    Tobacco abuse    Urge incontinence     PAST SURGICAL HISTORY: Past Surgical History:  Procedure Laterality Date   CYST EXCISION Left 01/2021   left foot   EYE SURGERY     at age 30   hysterectomy -age 44     ILIAC ARTERY STENT     RIGHT ILIAC STENT   KNEE ARTHROSCOPY     LEFT HEART CATH AND CORONARY ANGIOGRAPHY N/A 03/05/2022   Procedure: LEFT HEART CATH AND CORONARY ANGIOGRAPHY;  Surgeon: Dann Candyce RAMAN, MD;  Location: [facility] INVASIVE CV LAB;  Service: Cardiovascular;  Laterality: N/A;   LITHOTRIPSY Left 12/2020   LUMBAR LAMINECTOMY     TUBAL LIGATION      FAMILY HISTORY: Family History  Problem Relation Age of Onset   Alzheimer's disease Mother    Hodgkin's lymphoma Brother    Lung cancer Brother    Hepatitis C Brother    Hypothyroidism Brother    Hypertension Brother    Depression Brother    Heart disease Other        Grandfather    SOCIAL HISTORY: Social History   Socioeconomic History   Marital status: Divorced    Spouse name: Not on file   Number of children: 3   Years of education: 10 th   Highest education level: Not on file  Occupational  History   Occupation: DISABLED    Employer: UNEMPLOYED  Tobacco Use   Smoking status: Every Day    Current packs/day: 0.00    Average packs/day: 2.0 packs/day for 54.0 years (108.0 ttl pk-yrs)    Types: Cigarettes    Start date: 11/20/1958    Last attempt to quit: 11/20/2012    Years since quitting: 10.9   Smokeless tobacco: Never   Tobacco comments:    2 ppd for many years.     04/13/2022 Patient smokes 1/2 pack daily or more if she is nervous  Substance and Sexual Activity   Alcohol use: No    Alcohol/week: 0.0 standard drinks of alcohol   Drug use: No   Sexual activity: Never  Other Topics Concern   Not on file  Social History Narrative   Patient is single with 3 children, 1 deceased.   Patient is right handed.   Patient has 10 th grade education.   Patient drinks 5 or more cups daily.   Social Drivers of Corporate Investment Banker Strain: Low Risk  (02/27/2023)   Overall Financial Resource Strain (CARDIA)    Difficulty of Paying Living Expenses: Not hard at all  Food Insecurity: No Food Insecurity (09/11/2023)   Received from [facility]   Hunger Vital Sign    Worried About Running Out of Food in the Last Year: Never true    Ran Out of Food in the Last Year: Never true  Transportation Needs: No Transportation Needs (09/11/2023)   Received from [facility] - Transportation    Lack of Transportation (Medical): No  Lack of Transportation (Non-Medical): No  Physical Activity: Inactive (09/16/2023)   Exercise Vital Sign    Days of Exercise per Week: 0 days    Minutes of Exercise per Session: 0 min  Stress: No Stress Concern Present (09/11/2023)   Received from [facility] - Occupational Stress Questionnaire    Feeling of Stress : Not at all  Social Connections: Socially Isolated (09/16/2023)   Social Connection and Isolation Panel [NHANES]    Frequency of Communication with Friends and Family: More than  three times a week    Frequency of Social Gatherings with Friends and Family: Once a week    Attends Religious Services: Never    Database Administrator or Organizations: No    Attends Banker Meetings: Never    Marital Status: Divorced  Catering Manager Violence: Not At Risk (09/11/2023)   Received from [facility]   HITS    Over the last 12 months how often did your partner physically hurt you?: Never    Over the last 12 months how often did your partner insult you or talk down to you?: Never    Over the last 12 months how often did your partner threaten you with physical harm?: Never    Over the last 12 months how often did your partner scream or curse at you?: Never      Modena Callander, M.D. Ph.D.  [facility] [address], [address] [place], [place] [address] Ph: [phone] Fax: [phone]  CC:  Sherre Clapper, MD [address] [place],  [place] [address]  Sherre Clapper, MD    Total time spent reviewing the chart, obtaining history, examined patient, ordering tests, documentation, consultations and family, care coordination was 

## 2023-10-29 NOTE — Telephone Encounter (Signed)
[facility] - ORDER #[id number]

## 2023-10-30 ENCOUNTER — Telehealth: Payer: Self-pay

## 2023-10-30 NOTE — Telephone Encounter (Signed)
PA submitted and approved via covermymeds for ozempic. 

## 2023-11-04 ENCOUNTER — Telehealth: Payer: Self-pay

## 2023-11-04 NOTE — Telephone Encounter (Signed)
 Patient aware we are waiting for rx from [facility]. Patient states she changes her pull-ups 5-6 times a day. At times has wet the bed due to not changing her pull-up early enough. Uses a size med usually orders 8 packs (16 each) = 144 pulls up a month.

## 2023-11-04 NOTE — Telephone Encounter (Signed)
 Submitted and order via parachute for [facility] to see if this can remedy the issue (not received the rx from [facility]). Patient is aware.

## 2023-11-04 NOTE — Telephone Encounter (Signed)
 Spoke to [place] with [facility] regarding rx for 8 packs of Med pull ups for patient. Number she verified as our fax number was incorrect. Correct fax number provided, waiting to receive fax-will have Dr. Reinhold Carbine sign then fax back as soon as possible.

## 2023-11-05 ENCOUNTER — Other Ambulatory Visit: Payer: Self-pay

## 2023-11-05 DIAGNOSIS — N3946 Mixed incontinence: Secondary | ICD-10-CM

## 2023-11-05 DIAGNOSIS — E1140 Type 2 diabetes mellitus with diabetic neuropathy, unspecified: Secondary | ICD-10-CM

## 2023-11-05 MED ORDER — MIRABEGRON ER 50 MG PO TB24: 50.0000 mg | ORAL_TABLET | Freq: Every day | ORAL | 1 refills | Status: DC

## 2023-11-05 MED ORDER — OZEMPIC (2 MG/DOSE) 8 MG/3ML ~~LOC~~ SOPN: 2.0000 mg | PEN_INJECTOR | SUBCUTANEOUS | 1 refills | Status: DC

## 2023-11-10 NOTE — Progress Notes (Unsigned)
Subjective:  Patient ID: Sonya Small, female    DOB: [birth year]  Age: 72 y.o. MRN: [id number]  No chief complaint on file.   HPI        09/16/2023    3:17 PM 09/09/2023    9:51 AM 06/10/2023   11:44 AM 06/10/2023   11:04 AM 04/29/2023   11:39 AM  Depression screen PHQ 2/9  Decreased Interest 0 0  0 3  Down, Depressed, Hopeless 0 0  0 3  PHQ - 2 Score 0 0  0 6  Altered sleeping 0 0 2  3  Tired, decreased energy 0 0 3  3  Change in appetite 0 0 0  1  Feeling bad or failure about yourself  0 0 3  3  Trouble concentrating 0 0 3  3  Moving slowly or fidgety/restless 0 0 0  3  Suicidal thoughts 0 0 0    PHQ-9 Score 0 0   22  Difficult doing work/chores Not difficult at all Not difficult at all Somewhat difficult  Somewhat difficult        09/16/2023    3:17 PM  Fall Risk   Falls in the past year? 1  Number falls in past yr: 1  Injury with Fall? 1  Risk for fall due to : History of fall(s)  Follow up Falls evaluation completed    Patient Care Team: Blane Ohara, MD as PCP - General (Family Medicine) [name], [name], MD as PCP - Cardiology (Cardiology) Marcellus Scott, MD as Referring Physician (Specialist)   Review of Systems  Current Outpatient Medications on File Prior to Visit  Medication Sig Dispense Refill   albuterol (PROVENTIL) (2.5 MG/3ML) 0.083% nebulizer solution Take 3 mLs (2.5 mg total) by nebulization every 6 (six) hours as needed for wheezing or shortness of breath. 75 mL 2   albuterol (VENTOLIN HFA) 108 (90 Base) MCG/ACT inhaler Inhale 2 puffs into the lungs every 4 (four) hours as needed for wheezing or shortness of breath.     ALPRAZolam (XANAX) 0.5 MG tablet TAKE 1 TABLET BY MOUTH THREE TIMES DAILY 90 tablet 2   amLODipine (NORVASC) 5 MG tablet TAKE 1 TABLET BY MOUTH DAILY 30 tablet 3   Aspirin-Acetaminophen (GOODYS BODY PAIN PO) Take 1 packet by mouth daily.     baclofen (LIORESAL) 10 MG tablet Take 10 mg by mouth 3 (three) times daily as needed.      blood glucose meter kit and supplies KIT Dispense based on patient and insurance preference. Use up to four times daily as directed. 1 each 0   buPROPion (WELLBUTRIN XL) 150 MG 24 hr tablet TAKE 1 TABLET BY MOUTH ONCE DAILY 30 tablet 2   chlorthalidone (HYGROTON) 25 MG tablet TAKE 1/2 TABLET BY MOUTH EVERY DAY 45 tablet 3   clopidogrel (PLAVIX) 75 MG tablet TAKE 1 TABLET BY MOUTH EVERY MORNING 30 tablet 10   ezetimibe (ZETIA) 10 MG tablet TAKE 1 TABLET BY MOUTH ONCE DAILY 30 tablet 10   fenofibrate 160 MG tablet TAKE 1 TABLET BY MOUTH EVERY MORNING 30 tablet 10   gabapentin (NEURONTIN) 300 MG capsule TAKE 1 CAPSULE BY MOUTH EVERY MORNING AND TAKE 1 CAPSULE EVERY DAY AT BEDTIME 60 capsule 10   glucose blood (ONETOUCH ULTRA) test strip DISPENSE BASED ON PATIENT AND INSURANCE PREFERENCE. USE UP TO FOUR TIMES DAILY AS DIRECTED. 400 strip 3   lamoTRIgine (LAMICTAL) 25 MG tablet TAKE 2 TABLETS BY MOUTH EVERY DAY AT BEDTIME  60 tablet 10   lansoprazole (PREVACID) 30 MG capsule TAKE ONE CAPSULE BY MOUTH BEFORE BREAKFAST 90 capsule 3   mirabegron ER (MYRBETRIQ) 50 MG TB24 tablet Take 1 tablet (50 mg total) by mouth daily. 90 tablet 1   naloxone (NARCAN) nasal spray 4 mg/0.1 mL      nitroGLYCERIN (NITROSTAT) 0.4 MG SL tablet Dissolve 1 tab under tongue as needed for chest pain. May repeat every 5 minutes x 2 doses. If no relief call 9-1-1. 25 tablet 2   oxyCODONE-acetaminophen (PERCOCET) 10-325 MG tablet Take 0.5 tablets by mouth in the morning and at bedtime.     rosuvastatin (CRESTOR) 20 MG tablet TAKE 1 TABLET BY MOUTH EVERY DAY AT BEDTIME 30 tablet 10   Semaglutide, 2 MG/DOSE, (OZEMPIC, 2 MG/DOSE,) 8 MG/3ML SOPN Inject 2 mg into the skin once a week. 9 mL 1   torsemide (DEMADEX) 20 MG tablet TAKE 2 TABLETS BY MOUTH ONCE DAILY 60 tablet 2   TRELEGY ELLIPTA 200-62.5-25 MCG/ACT AEPB Inhale 1 puff into the lungs daily.     TRUEplus Lancets 30G MISC 1 each by Does not apply route 2 (two) times daily. E11.69  400 each 3   valACYclovir (VALTREX) 1000 MG tablet TAKE 2 TABLETS BY MOUTH TWICE DAILY AS NEEDED 20 tablet 2   valsartan (DIOVAN) 320 MG tablet Take 0.5 tablets (160 mg total) by mouth daily. 90 tablet 1   VASCEPA 1 g capsule TAKE TWO CAPSULES BY MOUTH TWICE DAILY 360 capsule 2   No current facility-administered medications on file prior to visit.   Past Medical History:  Diagnosis Date   Anxiety    CAD (coronary artery disease)    a. NSTEMI 11/2008 s/p DES to LCx (3.0x12 Xience); b. NSTEMI 01/2010 secondary to thrombotic RCA lesion (non-obstructive)-->med rx (integrilin x 24 hrs + plavix); c. 09/2012 negative Myoview.   Chronic diastolic CHF (congestive heart failure) (HCC)    a. 06/2014 Echo: EF 55-60%, no rwma, Gr1 DD, mild AI.   Depression    Dizziness    Drug induced constipation    Ganglion cyst of left foot 01/17/2021   Generalized hyperhidrosis    GERD (gastroesophageal reflux disease)    Headache    Hyperlipidemia    Hypertensive heart disease    Lumbar disc disease    Metabolic encephalopathy    Mixed hyperlipidemia    Myocardial infarction (HCC)    Obstructive sleep apnea    OP (osteoporosis)    Osteoarthritis    Osteoporosis    Other malaise    Overweight(278.02)    PAD (peripheral artery disease) (HCC)    a. Emboli to R foot 2010 from partially occlusive lesion in R EIA, s/p stenting. - followed by Dr. Arbie Cookey;  b. 10/2015 ABIs: R 1.03, L 0.97.   Restless leg    Sleep apnea    Stroke [facility])    TIA (transient ischemic attack)    Tobacco abuse    Urge incontinence    Past Surgical History:  Procedure Laterality Date   CYST EXCISION Left 01/2021   left foot   EYE SURGERY     at age 13   hysterectomy -age 21     ILIAC ARTERY STENT     RIGHT ILIAC STENT   KNEE ARTHROSCOPY     LEFT HEART CATH AND CORONARY ANGIOGRAPHY N/A 03/05/2022   Procedure: LEFT HEART CATH AND CORONARY ANGIOGRAPHY;  Surgeon: Corky Crafts, MD;  Location: [facility];  Service:  Cardiovascular;  Laterality: N/A;   LITHOTRIPSY Left 12/2020   LUMBAR LAMINECTOMY     TUBAL LIGATION      Family History  Problem Relation Age of Onset   Alzheimer's disease Mother    Hodgkin's lymphoma Brother    Lung cancer Brother    Hepatitis C Brother    Hypothyroidism Brother    Hypertension Brother    Depression Brother    Heart disease Other        Grandfather   Social History   Socioeconomic History   Marital status: Divorced    Spouse name: Not on file   Number of children: 3   Years of education: 10 th   Highest education level: Not on file  Occupational History   Occupation: DISABLED    Employer: UNEMPLOYED  Tobacco Use   Smoking status: Every Day    Current packs/day: 0.00    Average packs/day: 2.0 packs/day for 54.0 years (108.0 ttl pk-yrs)    Types: Cigarettes    Start date: 11/20/1958    Last attempt to quit: 11/20/2012    Years since quitting: 10.9   Smokeless tobacco: Never   Tobacco comments:    2 ppd for many years.     04/13/2022 Patient smokes 1/2 pack daily or more if she is nervous  Substance and Sexual Activity   Alcohol use: No    Alcohol/week: 0.0 standard drinks of alcohol   Drug use: No   Sexual activity: Never  Other Topics Concern   Not on file  Social History Narrative   Patient is single with 3 children, 1 deceased.   Patient is right handed.   Patient has 10 th grade education.   Patient drinks 5 or more cups daily.   Social Drivers of Corporate investment banker Strain: Low Risk  (02/27/2023)   Overall Financial Resource Strain (CARDIA)    Difficulty of Paying Living Expenses: Not hard at all  Food Insecurity: No Food Insecurity (09/11/2023)   Received from [facility]   Hunger Vital Sign    Worried About Running Out of Food in the Last Year: Never true    Ran Out of Food in the Last Year: Never true  Transportation Needs: No Transportation Needs (09/11/2023)   Received from [facility] - Transportation     Lack of Transportation (Medical): No    Lack of Transportation (Non-Medical): No  Physical Activity: Inactive (09/16/2023)   Exercise Vital Sign    Days of Exercise per Week: 0 days    Minutes of Exercise per Session: 0 min  Stress: No Stress Concern Present (09/11/2023)   Received from [facility] of Occupational Health - Occupational Stress Questionnaire    Feeling of Stress : Not at all  Social Connections: Socially Isolated (09/16/2023)   Social Connection and Isolation Panel [NHANES]    Frequency of Communication with Friends and Family: More than three times a week    Frequency of Social Gatherings with Friends and Family: Once a week    Attends Religious Services: Never    Database administrator or Organizations: No    Attends Banker Meetings: Never    Marital Status: Divorced    Objective:  There were no vitals taken for this visit.     10/29/2023   10:34 AM 10/21/2023    3:53 PM 09/26/2023    2:30 PM  BP/Weight  Systolic BP 96 110 98  Diastolic BP 45 62  52  Wt. (Lbs) 158.07  158  BMI 26.3 kg/m2  26.29 kg/m2    Physical Exam  Diabetic Foot Exam - Simple   No data filed      Lab Results  Component Value Date   WBC 9.7 08/01/2023   HGB 12.7 08/01/2023   HCT 40.5 08/01/2023   PLT 247 08/01/2023   GLUCOSE 125 (H) 08/01/2023   CHOL 111 08/01/2023   TRIG 85 08/01/2023   HDL 40 08/01/2023   LDLCALC 54 08/01/2023   ALT 17 08/01/2023   AST 18 08/01/2023   NA 147 (H) 08/01/2023   K 5.2 08/01/2023   CL 107 (H) 08/01/2023   CREATININE 0.91 08/01/2023   BUN 32 (H) 08/01/2023   CO2 26 08/01/2023   TSH 0.734 04/26/2023   INR 1.0 01/31/2019   HGBA1C 7.5 (H) 08/01/2023   MICROALBUR neg 02/27/2021      Assessment & Plan:    There are no diagnoses linked to this encounter.   No orders of the defined types were placed in this encounter.   No orders of the defined types were placed in this encounter.    Follow-up: No  follow-ups on file.   I,Marla I Leal-Borjas,acting as a scribe for Blane Ohara, MD.,have documented all relevant documentation on the behalf of Blane Ohara, MD,as directed by  Blane Ohara, MD while in the presence of Blane Ohara, MD.   An After Visit Summary was printed and given to the patient.  Blane Ohara, MD [facility] [phone]

## 2023-11-11 ENCOUNTER — Other Ambulatory Visit (HOSPITAL_COMMUNITY): Payer: Self-pay

## 2023-11-11 ENCOUNTER — Ambulatory Visit (INDEPENDENT_AMBULATORY_CARE_PROVIDER_SITE_OTHER): Payer: Medicare Other | Admitting: Family Medicine

## 2023-11-11 ENCOUNTER — Encounter: Payer: Self-pay | Admitting: Family Medicine

## 2023-11-11 VITALS — BP 112/68 | HR 84 | Temp 97.8°F | Ht 65.0 in | Wt 158.0 lb

## 2023-11-11 DIAGNOSIS — I2510 Atherosclerotic heart disease of native coronary artery without angina pectoris: Secondary | ICD-10-CM

## 2023-11-11 DIAGNOSIS — M542 Cervicalgia: Secondary | ICD-10-CM | POA: Insufficient documentation

## 2023-11-11 DIAGNOSIS — I110 Hypertensive heart disease with heart failure: Secondary | ICD-10-CM

## 2023-11-11 DIAGNOSIS — J449 Chronic obstructive pulmonary disease, unspecified: Secondary | ICD-10-CM

## 2023-11-11 DIAGNOSIS — E1142 Type 2 diabetes mellitus with diabetic polyneuropathy: Secondary | ICD-10-CM

## 2023-11-11 DIAGNOSIS — R5383 Other fatigue: Secondary | ICD-10-CM

## 2023-11-11 DIAGNOSIS — I5042 Chronic combined systolic (congestive) and diastolic (congestive) heart failure: Secondary | ICD-10-CM

## 2023-11-11 DIAGNOSIS — N3946 Mixed incontinence: Secondary | ICD-10-CM

## 2023-11-11 DIAGNOSIS — F332 Major depressive disorder, recurrent severe without psychotic features: Secondary | ICD-10-CM

## 2023-11-11 DIAGNOSIS — E782 Mixed hyperlipidemia: Secondary | ICD-10-CM

## 2023-11-11 MED ORDER — GABAPENTIN 600 MG PO TABS: 600.0000 mg | ORAL_TABLET | Freq: Two times a day (BID) | ORAL | 2 refills | Status: DC

## 2023-11-11 MED ORDER — GABAPENTIN 600 MG PO TABS
600.0000 mg | ORAL_TABLET | Freq: Two times a day (BID) | ORAL | 2 refills | Status: DC
  Filled 2023-11-11: qty 60, 30d supply, fill #0

## 2023-11-11 MED ORDER — TORSEMIDE 20 MG PO TABS
20.0000 mg | ORAL_TABLET | Freq: Every day | ORAL | 2 refills | Status: DC
  Filled 2023-11-11: qty 30, 30d supply, fill #0

## 2023-11-11 MED ORDER — ALPRAZOLAM 0.5 MG PO TABS: 0.5000 mg | ORAL_TABLET | Freq: Every day | ORAL | Status: DC | PRN

## 2023-11-11 NOTE — Assessment & Plan Note (Signed)
Continue Trelegy 1 inhalation daily.  Continue albuterol as needed. Recommend strongly that the patient quit smoking.  She refuses.

## 2023-11-11 NOTE — Assessment & Plan Note (Signed)
Not at goal, but patient does not wish to pursue counseling. Continue lamictal, wellbutrin xl, and Xanax. Patient is taking xanax 0.5 mg daily as needed. Change xanax prescription as she is not taking three times a day.

## 2023-11-11 NOTE — Assessment & Plan Note (Signed)
Increase gabapentin 600 mg twice daily.

## 2023-11-11 NOTE — Patient Instructions (Addendum)
Increase gabapentin 600 mg twice daily.  Decrease torsemide to 20 mg once daily.  Monitor for worsening swelling.  Weight daily in the morning.  If increase I weight by 3 lbs in 24 hours or 5 lbs in one week, take an extra torsemide.   Change pharmacies [facility] to get pill packs.

## 2023-11-11 NOTE — Assessment & Plan Note (Addendum)
At goal. Continue valsartan 320 mg 1/2 daily, amlodipine 5 mg daily, and chlorthalidone 25 mg 1/2 daily.  Decrease torsemide to 20 mg daily.  Monitor for worsening swelling.  Weight daily in the morning.  If increase I weight by 3 lbs in 24 hours or 5 lbs in one week, take an extra torsemide.

## 2023-11-11 NOTE — Assessment & Plan Note (Signed)
Control: good Recommend check sugars fasting daily. Recommend check feet daily. Recommend annual eye exams. Medicines: Continue ozempic 2 mg weekly. Continue to work on eating a healthy diet and exercise.

## 2023-11-11 NOTE — Assessment & Plan Note (Signed)
Decrease torsemide to 20 mg daily.  Continue chlorthalidone.  Continue myrbetriq.  Continue use of depends.

## 2023-11-11 NOTE — Assessment & Plan Note (Signed)
Well controlled.  No changes to medicines. Continue fenofibrate 160 mg daily, rosuvastatin 20 mg daily. Vascepa 1 gm 2 capsules twice daily, and zetia 10 mg daily.   Continue to work on eating a healthy diet and exercise.

## 2023-11-11 NOTE — Assessment & Plan Note (Signed)
Management per specialist.  Continue plavix and crestor, fenofibrate, and zetia.

## 2023-11-12 ENCOUNTER — Telehealth: Payer: Self-pay

## 2023-11-12 ENCOUNTER — Other Ambulatory Visit: Payer: Self-pay

## 2023-11-12 NOTE — Progress Notes (Signed)
Care Guide Pharmacy Note  11/12/2023 Name: Sonya Small MRN: [id number] DOB: Dec 16, 1950  Referred By: Blane Ohara, MD Reason for referral: Care Coordination (Outreach to schedule with Pharm d )   Sonya Small is a 73 y.o. year old female who is a primary care patient of [name], [name], MD.  Sonya Small was referred to the pharmacist for assistance related to: HLD, COPD, and DMII  Successful contact was made with the patient to discuss pharmacy services.  Patient declines engagement at this time. Contact information was provided to the patient should they wish to reach out for assistance at a later time.  Penne Lash , RMA     [facility]  [facility] Guide  Direct Dial: [phone]  Website: [email or web address]

## 2023-11-12 NOTE — Telephone Encounter (Signed)
Gave verbal okay for the below.  Copied from CRM [id number]. Topic: Clinical - Home Health Verbal Orders >> Nov 12, 2023 10:32 AM Eunice Blase wrote: Caller/Agency: [facility] Callback Number: [phone] Service Requested: Physical Therapy Frequency: 1x week for 5 weeks 11/17/2023 Any new concerns about the patient? No

## 2023-11-13 ENCOUNTER — Other Ambulatory Visit: Payer: Medicare Other | Admitting: *Deleted

## 2023-11-13 ENCOUNTER — Other Ambulatory Visit (HOSPITAL_COMMUNITY): Payer: Self-pay

## 2023-11-13 ENCOUNTER — Other Ambulatory Visit: Payer: Self-pay

## 2023-11-13 DIAGNOSIS — I110 Hypertensive heart disease with heart failure: Secondary | ICD-10-CM

## 2023-11-13 MED ORDER — GABAPENTIN 600 MG PO TABS: 600.0000 mg | ORAL_TABLET | Freq: Two times a day (BID) | ORAL | 2 refills | Status: DC

## 2023-11-13 MED ORDER — TORSEMIDE 20 MG PO TABS: 20.0000 mg | ORAL_TABLET | Freq: Every day | ORAL | 2 refills | Status: DC

## 2023-11-19 ENCOUNTER — Ambulatory Visit: Payer: Medicare Other

## 2023-11-19 ENCOUNTER — Ambulatory Visit: Payer: Medicare Other | Admitting: Adult Health

## 2023-11-21 ENCOUNTER — Telehealth: Payer: Self-pay

## 2023-11-21 ENCOUNTER — Ambulatory Visit (INDEPENDENT_AMBULATORY_CARE_PROVIDER_SITE_OTHER): Payer: Medicare Other | Admitting: Neurology

## 2023-11-21 DIAGNOSIS — G4710 Hypersomnia, unspecified: Secondary | ICD-10-CM

## 2023-11-21 DIAGNOSIS — R4182 Altered mental status, unspecified: Secondary | ICD-10-CM

## 2023-11-21 DIAGNOSIS — I679 Cerebrovascular disease, unspecified: Secondary | ICD-10-CM

## 2023-11-21 DIAGNOSIS — R410 Disorientation, unspecified: Secondary | ICD-10-CM

## 2023-11-21 DIAGNOSIS — R269 Unspecified abnormalities of gait and mobility: Secondary | ICD-10-CM

## 2023-11-21 NOTE — Telephone Encounter (Signed)
 This has been placed in dr. Marcha Solders box

## 2023-11-22 DIAGNOSIS — I110 Hypertensive heart disease with heart failure: Secondary | ICD-10-CM

## 2023-11-22 DIAGNOSIS — I69320 Aphasia following cerebral infarction: Secondary | ICD-10-CM | POA: Diagnosis not present

## 2023-11-22 DIAGNOSIS — I5032 Chronic diastolic (congestive) heart failure: Secondary | ICD-10-CM

## 2023-11-22 DIAGNOSIS — E1151 Type 2 diabetes mellitus with diabetic peripheral angiopathy without gangrene: Secondary | ICD-10-CM | POA: Diagnosis not present

## 2023-11-22 DIAGNOSIS — R631 Polydipsia: Secondary | ICD-10-CM | POA: Diagnosis not present

## 2023-11-22 DIAGNOSIS — F32A Depression, unspecified: Secondary | ICD-10-CM

## 2023-11-22 DIAGNOSIS — M51369 Other intervertebral disc degeneration, lumbar region without mention of lumbar back pain or lower extremity pain: Secondary | ICD-10-CM

## 2023-11-22 DIAGNOSIS — F419 Anxiety disorder, unspecified: Secondary | ICD-10-CM

## 2023-11-22 DIAGNOSIS — I69398 Other sequelae of cerebral infarction: Secondary | ICD-10-CM | POA: Diagnosis not present

## 2023-11-22 DIAGNOSIS — I2510 Atherosclerotic heart disease of native coronary artery without angina pectoris: Secondary | ICD-10-CM

## 2023-11-22 DIAGNOSIS — M810 Age-related osteoporosis without current pathological fracture: Secondary | ICD-10-CM

## 2023-11-22 DIAGNOSIS — E1165 Type 2 diabetes mellitus with hyperglycemia: Secondary | ICD-10-CM

## 2023-11-25 NOTE — Procedures (Signed)
   HISTORY: 73 year old female with advanced cerebrovascular disease gradually increased confusion  TECHNIQUE:  This is a routine 16 channel EEG recording with one channel devoted to a limited EKG recording.  It was performed during wakefulness, drowsiness and asleep.  Photic stimulation were performed as activating procedures.  There are minimum muscle and movement artifact noted.  Upon maximum arousal, posterior dominant waking rhythm consistent of rhythmic alpha range activity. Activities are symmetric over the bilateral posterior derivations and attenuated with eye opening.  Photic stimulation did not alter the tracing.  Hyperventilation was not performed due to reported history of COPD  During EEG recording, patient developed drowsiness and entered sleep, sleep EEG demonstrated architecture, there were frontal centrally dominant vertex waves and symmetric sleep spindles noted.  During EEG recording, there was no epileptiform discharge noted.  EKG demonstrate normal sinus rhythm.  CONCLUSION: This is a  normal awake and asleep EEG.  There is no electrodiagnostic evidence of epileptiform discharge.  Levert Feinstein, M.D. Ph.D.  [facility] [address] [place], [place] [address] Phone: [phone] Fax:      [phone]

## 2023-11-26 ENCOUNTER — Encounter: Payer: Self-pay | Admitting: Neurology

## 2023-12-02 ENCOUNTER — Other Ambulatory Visit: Payer: Self-pay | Admitting: Family Medicine

## 2023-12-02 DIAGNOSIS — R5383 Other fatigue: Secondary | ICD-10-CM

## 2023-12-03 ENCOUNTER — Other Ambulatory Visit: Payer: Self-pay | Admitting: Family Medicine

## 2023-12-22 ENCOUNTER — Other Ambulatory Visit: Payer: Self-pay | Admitting: Family Medicine

## 2023-12-22 DIAGNOSIS — I110 Hypertensive heart disease with heart failure: Secondary | ICD-10-CM

## 2023-12-24 ENCOUNTER — Other Ambulatory Visit: Payer: Self-pay | Admitting: Family Medicine

## 2023-12-24 DIAGNOSIS — F332 Major depressive disorder, recurrent severe without psychotic features: Secondary | ICD-10-CM

## 2023-12-27 ENCOUNTER — Other Ambulatory Visit: Payer: Self-pay

## 2023-12-27 DIAGNOSIS — E1142 Type 2 diabetes mellitus with diabetic polyneuropathy: Secondary | ICD-10-CM

## 2023-12-27 MED ORDER — TRUEPLUS LANCETS 30G MISC: 1.0000 | Freq: Two times a day (BID) | 3 refills | Status: DC

## 2023-12-27 MED ORDER — ONETOUCH ULTRA VI STRP: ORAL_STRIP | 3 refills | Status: DC

## 2024-01-01 ENCOUNTER — Ambulatory Visit (INDEPENDENT_AMBULATORY_CARE_PROVIDER_SITE_OTHER): Admitting: Family Medicine

## 2024-01-01 ENCOUNTER — Encounter: Payer: Self-pay | Admitting: Family Medicine

## 2024-01-01 VITALS — BP 100/60 | HR 88 | Temp 98.4°F | Resp 16 | Ht 65.0 in | Wt 153.6 lb

## 2024-01-01 DIAGNOSIS — B3731 Acute candidiasis of vulva and vagina: Secondary | ICD-10-CM | POA: Diagnosis not present

## 2024-01-01 DIAGNOSIS — R350 Frequency of micturition: Secondary | ICD-10-CM | POA: Insufficient documentation

## 2024-01-01 DIAGNOSIS — R198 Other specified symptoms and signs involving the digestive system and abdomen: Secondary | ICD-10-CM | POA: Insufficient documentation

## 2024-01-01 DIAGNOSIS — R300 Dysuria: Secondary | ICD-10-CM | POA: Diagnosis not present

## 2024-01-01 DIAGNOSIS — N814 Uterovaginal prolapse, unspecified: Secondary | ICD-10-CM | POA: Insufficient documentation

## 2024-01-01 LAB — POCT URINALYSIS DIP (CLINITEK)
Bilirubin, UA: NEGATIVE
Blood, UA: NEGATIVE
Glucose, UA: NEGATIVE mg/dL
Ketones, POC UA: NEGATIVE mg/dL
Leukocytes, UA: NEGATIVE
Nitrite, UA: NEGATIVE
POC PROTEIN,UA: NEGATIVE
Spec Grav, UA: 1.015 (ref 1.010–1.025)
Urobilinogen, UA: 0.2 U/dL
pH, UA: 5.5 (ref 5.0–8.0)

## 2024-01-01 MED ORDER — FLUCONAZOLE 150 MG PO TABS: 150.0000 mg | ORAL_TABLET | Freq: Every day | ORAL | 0 refills | Status: AC

## 2024-01-01 NOTE — Assessment & Plan Note (Signed)
 Dysuria, itching, and discomfort suggest yeast infection. - Prescribe Diflucan 150 mg single dose. - Send prescription to [facility].

## 2024-01-01 NOTE — Assessment & Plan Note (Signed)
 Prolapsed bladder causing urinary symptoms. Prior hysterectomy may contribute. Options include pessary or surgery. - Refer to urogynecologist for evaluation and management options. - Discuss pessary use to support bladder and alleviate symptoms. - Consider surgery if pessary is ineffective or unsuitable.

## 2024-01-01 NOTE — Progress Notes (Signed)
 Acute Office Visit  Subjective:    Patient ID: Sonya Small, female    DOB: 1951-04-24, 73 y.o.   MRN: [id number]  Chief Complaint  Patient presents with   Bladder Prolapse   Nausea    Vomiting diahrrea    Discussed the use of AI scribe software for clinical note transcription with the patient, who gave verbal consent to proceed.   HPI: Sonya Small is a 73 year old female who presents with a prolapsed bladder and associated urinary symptoms. She was referred for a urology consultation in December 2023.  She experiences a prolapsed bladder, described as a bulging sensation in the vaginal area during urination, with a noticeable protrusion that retracts upon changing positions. The sensation is likened to 'a little egg' and causes discomfort, particularly when sticking to her pull-ups.  She reports urinary symptoms including burning during urination and a sensation of incomplete bladder emptying. During the review of symptoms, she mentions itching, burning, and a weird sensation in the vaginal area. No recent urology consultation despite a referral being made in December 2023.  She experiences nausea, vomiting, and diarrhea, which she suspects may be related to her gabapentin use. She has been on gabapentin for a long time, currently taking 300 mg once a day, although her prescription is for 600 mg twice a day.  Her past medical history includes a hysterectomy performed in 1975 after her last child was 49 months old. She has had three children.  Past Medical History:  Diagnosis Date   Anxiety    CAD (coronary artery disease)    a. NSTEMI 11/2008 s/p DES to LCx (3.0x12 Xience); b. NSTEMI 01/2010 secondary to thrombotic RCA lesion (non-obstructive)-->med rx (integrilin x 24 hrs + plavix); c. 09/2012 negative Myoview.   Chronic diastolic CHF (congestive heart failure) (HCC)    a. 06/2014 Echo: EF 55-60%, no rwma, Gr1 DD, mild AI.   Depression    Dizziness    Drug induced constipation     Ganglion cyst of left foot 01/17/2021   Generalized hyperhidrosis    GERD (gastroesophageal reflux disease)    Headache    Hyperlipidemia    Hypertensive heart disease    Lumbar disc disease    Metabolic encephalopathy    Mixed hyperlipidemia    Myocardial infarction (HCC)    Obstructive sleep apnea    OP (osteoporosis)    Osteoarthritis    Osteoporosis    Other malaise    Overweight(278.02)    PAD (peripheral artery disease) (HCC)    a. Emboli to R foot 2010 from partially occlusive lesion in R EIA, s/p stenting. - followed by Dr. Arbie Cookey;  b. 10/2015 ABIs: R 1.03, L 0.97.   Restless leg    Sleep apnea    Stroke [facility])    TIA (transient ischemic attack)    Tobacco abuse    Urge incontinence     Past Surgical History:  Procedure Laterality Date   CYST EXCISION Left 01/2021   left foot   EYE SURGERY     at age 18   hysterectomy -age 8     ILIAC ARTERY STENT     RIGHT ILIAC STENT   KNEE ARTHROSCOPY     LEFT HEART CATH AND CORONARY ANGIOGRAPHY N/A 03/05/2022   Procedure: LEFT HEART CATH AND CORONARY ANGIOGRAPHY;  Surgeon: Corky Crafts, MD;  Location: [facility];  Service: Cardiovascular;  Laterality: N/A;   LITHOTRIPSY Left 12/2020   LUMBAR LAMINECTOMY  TUBAL LIGATION      Family History  Problem Relation Age of Onset   Alzheimer's disease Mother    Hodgkin's lymphoma Brother    Lung cancer Brother    Hepatitis C Brother    Hypothyroidism Brother    Hypertension Brother    Depression Brother    Heart disease Other        Grandfather    Social History   Socioeconomic History   Marital status: Divorced    Spouse name: Not on file   Number of children: 3   Years of education: 10 th   Highest education level: Not on file  Occupational History   Occupation: DISABLED    Employer: UNEMPLOYED  Tobacco Use   Smoking status: Every Day    Current packs/day: 0.00    Average packs/day: 2.0 packs/day for 54.0 years (108.0 ttl pk-yrs)    Types:  Cigarettes    Start date: 11/20/1958    Last attempt to quit: 11/20/2012    Years since quitting: 11.1   Smokeless tobacco: Never   Tobacco comments:    2 ppd for many years.     04/13/2022 Patient smokes 1/2 pack daily or more if she is nervous  Substance and Sexual Activity   Alcohol use: No    Alcohol/week: 0.0 standard drinks of alcohol   Drug use: No   Sexual activity: Never  Other Topics Concern   Not on file  Social History Narrative   Patient is single with 3 children, 1 deceased.   Patient is right handed.   Patient has 10 th grade education.   Patient drinks 5 or more cups daily.   Social Drivers of Corporate investment banker Strain: Low Risk  (02/27/2023)   Overall Financial Resource Strain (CARDIA)    Difficulty of Paying Living Expenses: Not hard at all  Food Insecurity: No Food Insecurity (09/11/2023)   Received from [facility]   Hunger Vital Sign    Worried About Running Out of Food in the Last Year: Never true    Ran Out of Food in the Last Year: Never true  Transportation Needs: No Transportation Needs (09/11/2023)   Received from [facility] - Transportation    Lack of Transportation (Medical): No    Lack of Transportation (Non-Medical): No  Physical Activity: Inactive (09/16/2023)   Exercise Vital Sign    Days of Exercise per Week: 0 days    Minutes of Exercise per Session: 0 min  Stress: No Stress Concern Present (09/11/2023)   Received from [facility] - Occupational Stress Questionnaire    Feeling of Stress : Not at all  Social Connections: Socially Isolated (09/16/2023)   Social Connection and Isolation Panel [NHANES]    Frequency of Communication with Friends and Family: More than three times a week    Frequency of Social Gatherings with Friends and Family: Once a week    Attends Religious Services: Never    Database administrator or Organizations: No    Attends Banker  Meetings: Never    Marital Status: Divorced  Catering manager Violence: Not At Risk (09/11/2023)   Received from [facility]   HITS    Over the last 12 months how often did your partner physically hurt you?: Never    Over the last 12 months how often did your partner insult you or talk down to you?: Never    Over the  last 12 months how often did your partner threaten you with physical harm?: Never    Over the last 12 months how often did your partner scream or curse at you?: Never    Outpatient Medications Prior to Visit  Medication Sig Dispense Refill   albuterol (PROVENTIL) (2.5 MG/3ML) 0.083% nebulizer solution Take 3 mLs (2.5 mg total) by nebulization every 6 (six) hours as needed for wheezing or shortness of breath. 75 mL 2   albuterol (VENTOLIN HFA) 108 (90 Base) MCG/ACT inhaler Inhale 2 puffs into the lungs every 4 (four) hours as needed for wheezing or shortness of breath.     ALPRAZolam (XANAX) 0.5 MG tablet TAKE 1 TABLET BY MOUTH THREE TIMES A DAY 90 tablet 2   amLODipine (NORVASC) 5 MG tablet TAKE 1 TABLET BY MOUTH DAILY 30 tablet 3   Aspirin-Acetaminophen (GOODYS BODY PAIN PO) Take 1 packet by mouth daily.     baclofen (LIORESAL) 10 MG tablet Take 10 mg by mouth 3 (three) times daily as needed.     blood glucose meter kit and supplies KIT Dispense based on patient and insurance preference. Use up to four times daily as directed. 1 each 0   buPROPion (WELLBUTRIN XL) 150 MG 24 hr tablet TAKE 1 TABLET BY MOUTH ONCE DAILY 30 tablet 10   chlorthalidone (HYGROTON) 25 MG tablet TAKE 1/2 TABLET BY MOUTH EVERY DAY 45 tablet 3   clopidogrel (PLAVIX) 75 MG tablet TAKE 1 TABLET BY MOUTH EVERY MORNING 30 tablet 10   ezetimibe (ZETIA) 10 MG tablet TAKE 1 TABLET BY MOUTH ONCE DAILY 30 tablet 10   fenofibrate 160 MG tablet TAKE 1 TABLET BY MOUTH EVERY MORNING 30 tablet 10   gabapentin (NEURONTIN) 600 MG tablet Take 1 tablet (600 mg total) by mouth 2 (two) times daily. 60 tablet 2   glucose  blood (ONETOUCH ULTRA) test strip DISPENSE BASED ON PATIENT AND INSURANCE PREFERENCE. USE UP TO FOUR TIMES DAILY AS DIRECTED. 400 strip 3   lamoTRIgine (LAMICTAL) 25 MG tablet TAKE 2 TABLETS BY MOUTH EVERY DAY AT BEDTIME 60 tablet 10   lansoprazole (PREVACID) 30 MG capsule TAKE ONE CAPSULE BY MOUTH BEFORE BREAKFAST 90 capsule 3   mirabegron ER (MYRBETRIQ) 50 MG TB24 tablet Take 1 tablet (50 mg total) by mouth daily. 90 tablet 1   naloxone (NARCAN) nasal spray 4 mg/0.1 mL      nitroGLYCERIN (NITROSTAT) 0.4 MG SL tablet Dissolve 1 tab under tongue as needed for chest pain. May repeat every 5 minutes x 2 doses. If no relief call 9-1-1. 25 tablet 2   oxyCODONE-acetaminophen (PERCOCET) 10-325 MG tablet Take 0.5 tablets by mouth in the morning and at bedtime.     rosuvastatin (CRESTOR) 20 MG tablet TAKE 1 TABLET BY MOUTH EVERY DAY AT BEDTIME 30 tablet 10   Semaglutide, 2 MG/DOSE, (OZEMPIC, 2 MG/DOSE,) 8 MG/3ML SOPN Inject 2 mg into the skin once a week. 9 mL 1   torsemide (DEMADEX) 20 MG tablet Take 1 tablet (20 mg total) by mouth daily. 30 tablet 2   TRELEGY ELLIPTA 200-62.5-25 MCG/ACT AEPB Inhale 1 puff into the lungs daily.     TRUEplus Lancets 30G MISC 1 each by Does not apply route 2 (two) times daily. E11.69 400 each 3   valACYclovir (VALTREX) 1000 MG tablet TAKE 2 TABLETS BY MOUTH TWICE DAILY AS NEEDED 20 tablet 2   valsartan (DIOVAN) 160 MG tablet TAKE 1 TABLET BY MOUTH ONCE DAILY 30 tablet 10  valsartan (DIOVAN) 320 MG tablet TAKE 1 TABLET BY MOUTH EVERY DAY 90 tablet 1   VASCEPA 1 g capsule TAKE 2 CAPSULES BY MOUTH TWICE DAILY 120 capsule 10   No facility-administered medications prior to visit.    Allergies  Allergen Reactions   Latex Rash   Abilify [Aripiprazole]     confusion    Review of Systems  Constitutional:  Positive for fatigue. Negative for chills, diaphoresis and fever.  HENT:  Negative for congestion, ear pain and sinus pain.   Eyes: Negative.   Respiratory:  Negative  for cough, shortness of breath and wheezing.   Cardiovascular:  Negative for chest pain.  Gastrointestinal:  Positive for diarrhea, nausea and vomiting. Negative for abdominal pain and constipation.  Endocrine: Negative.   Genitourinary:  Positive for dysuria, frequency and urgency.  Musculoskeletal:  Negative for arthralgias.  Skin: Negative.   Allergic/Immunologic: Negative.   Neurological:  Negative for weakness and headaches.  Hematological: Negative.   Psychiatric/Behavioral:  Negative for dysphoric mood. The patient is not nervous/anxious.        Objective:        01/01/2024   11:27 AM 11/11/2023    1:39 PM 10/29/2023   10:34 AM  Vitals with BMI  Height 5\' 5"  5\' 5"  5\' 5"   Weight 153 lbs 10 oz 158 lbs 158 lbs 1 oz  BMI 25.56 26.29 26.3  Systolic 100 112 96  Diastolic 60 68 45  Pulse 88 84 74    No data found.   Physical Exam Vitals reviewed. Exam conducted with a chaperone present.  Constitutional:      General: She is not in acute distress.    Appearance: Normal appearance. She is not ill-appearing.  Eyes:     Conjunctiva/sclera: Conjunctivae normal.  Cardiovascular:     Rate and Rhythm: Normal rate and regular rhythm.     Heart sounds: Normal heart sounds. No murmur heard. Pulmonary:     Effort: Pulmonary effort is normal.     Breath sounds: Normal breath sounds. No wheezing.  Abdominal:     Tenderness: There is no abdominal tenderness.  Genitourinary:    Exam position: Lithotomy position.     Vagina: Vaginal discharge, tenderness and prolapsed vaginal walls present. No erythema or bleeding.     Uterus: Absent.      Rectum: External hemorrhoid present.  Musculoskeletal:        General: Normal range of motion.  Skin:    General: Skin is warm.  Neurological:     Mental Status: She is alert. Mental status is at baseline.  Psychiatric:        Mood and Affect: Mood normal.        Behavior: Behavior normal.     Health Maintenance Due  Topic Date Due    DTaP/Tdap/Td (1 - Tdap) Never done   Colonoscopy  Never done   Zoster Vaccines- Shingrix (1 of 2) Never done   Lung Cancer Screening  03/02/2022   COVID-19 Vaccine (3 - 2024-25 season) 05/26/2023   OPHTHALMOLOGY EXAM  09/05/2023   FOOT EXAM  12/06/2023   Diabetic kidney evaluation - Urine ACR  01/02/2024    There are no preventive care reminders to display for this patient.   Lab Results  Component Value Date   TSH 0.734 04/26/2023   Lab Results  Component Value Date   WBC 9.7 08/01/2023   HGB 12.7 08/01/2023   HCT 40.5 08/01/2023   MCV 90 08/01/2023   PLT  247 08/01/2023   Lab Results  Component Value Date   NA 147 (H) 08/01/2023   K 5.2 08/01/2023   CO2 26 08/01/2023   GLUCOSE 125 (H) 08/01/2023   BUN 32 (H) 08/01/2023   CREATININE 0.91 08/01/2023   BILITOT 0.3 08/01/2023   ALKPHOS 82 08/01/2023   AST 18 08/01/2023   ALT 17 08/01/2023   PROT 6.3 08/01/2023   ALBUMIN 4.0 08/01/2023   CALCIUM 9.4 08/01/2023   ANIONGAP 10 10/19/2020   EGFR 66 08/01/2023   Lab Results  Component Value Date   CHOL 111 08/01/2023   Lab Results  Component Value Date   HDL 40 08/01/2023   Lab Results  Component Value Date   LDLCALC 54 08/01/2023   Lab Results  Component Value Date   TRIG 85 08/01/2023   Lab Results  Component Value Date   CHOLHDL 2.8 08/01/2023   Lab Results  Component Value Date   HGBA1C 7.5 (H) 08/01/2023       Assessment & Plan:  Dysuria Assessment & Plan: UA normal.  No evidence of atrophic vaginitis or vaginal infection.  Recommended push fluids.  Orders: -     POCT URINALYSIS DIP (CLINITEK)  Cystocele with prolapse Assessment & Plan: Prolapsed bladder causing urinary symptoms. Prior hysterectomy may contribute. Options include pessary or surgery. - Refer to urogynecologist for evaluation and management options. - Discuss pessary use to support bladder and alleviate symptoms. - Consider surgery if pessary is ineffective or  unsuitable.   Orders: -     Ambulatory referral to Gynecology  Vaginal yeast infection Assessment & Plan: Dysuria, itching, and discomfort suggest yeast infection. - Prescribe Diflucan 150 mg single dose. - Send prescription to [facility].   Orders: -     Fluconazole; Take 1 tablet (150 mg total) by mouth daily for 1 dose.  Dispense: 1 tablet; Refill: 0  GI symptoms Assessment & Plan: Nausea, vomiting, and diarrhea likely due to gabapentin despite long-term use. Dosage adjustment advised. - If taking 300 mg twice daily, reduce to once daily for a week, then discontinue. - If taking 300 mg once daily, discontinue immediately.    Meds ordered this encounter  Medications   fluconazole (DIFLUCAN) 150 MG tablet    Sig: Take 1 tablet (150 mg total) by mouth daily for 1 dose.    Dispense:  1 tablet    Refill:  0    Orders Placed This Encounter  Procedures   Ambulatory referral to Gynecology   POCT URINALYSIS DIP (CLINITEK)     Follow-up: Return if symptoms worsen or fail to improve, keep appointment with Dr. Sedalia Muta on Monday.  An After Visit Summary was printed and given to the patient.  Total time spent on today's visit was 36 minutes, including both face-to-face time and nonface-to-face time personally spent on review of chart (labs and imaging), discussing labs and goals, discussing further work-up, treatment options, referrals to specialist if needed, reviewing outside records if pertinent, answering patient's questions, and coordinating care.    Lajuana Matte, FNP [name] [phone]

## 2024-01-01 NOTE — Assessment & Plan Note (Signed)
 UA normal.  No evidence of atrophic vaginitis or vaginal infection.  Recommended push fluids.

## 2024-01-01 NOTE — Assessment & Plan Note (Signed)
 Nausea, vomiting, and diarrhea likely due to gabapentin despite long-term use. Dosage adjustment advised. - If taking 300 mg twice daily, reduce to once daily for a week, then discontinue. - If taking 300 mg once daily, discontinue immediately.

## 2024-01-01 NOTE — Patient Instructions (Addendum)
 [facility] [place] [address], [place] [address]   Hassell Done, MD   - Diflucan 150 mg once for yeast infection

## 2024-01-05 NOTE — Progress Notes (Unsigned)
 Subjective:  Patient ID: Sonya Small, female    DOB: 11-Sep-1951  Age: 73 y.o. MRN: [id number]  Chief Complaint  Patient presents with   Medical Management of Chronic Issues   Discussed the use of AI scribe software for clinical note transcription with the patient, who gave verbal consent to proceed.  History of Present Illness  The patient, with a history of hypertension, diabetes, possible stroke, and anxiety, presents with multiple complaints.   In terms of mental health, the patient reports severe depression and anxiety. She is currently on Wellbutrin , Lamictal , and Xanax , but does not feel these medications are helping. She expresses a strong reluctance to see a psychiatrist.  The patient also discusses her bladder issues and mentions that she is on Myrbetriq , but does not feel it is helping. She has an upcoming appointment with a gynecologist to address these issues.    Diabetes:  Ozempic  2 mg weekly. Last A1c 7.7.  Exercises twice a day.  She checks her feet daily.  She tries to eat healthy.  Sugars range 121-142.  Coronary artery disease/hypertensive congestive heart failure: Patient is currently taking valsartan  160 mg 1 pill daily, chlorthalidone  25 mg half pill daily, torsemide  20 mg 2 daily, amlodipine  5 mg daily.  Currently on fenofibrate  160 mg daily, rosuvastatin  20 mg daily, Vascepa  1 g 2 capsules twice daily, and Zetia  10 mg daily.  Patient is also currently taking Plavix  75 mg daily.  Patient has nitroglycerin .  Patient has continued dysthymia/severe depression.  Tried numerous medications without any improvement.  Patient is on Wellbutrin  XL 150 mg daily, Lamictal  25 mg 2 tablets at bedtime.  Patient has been receiving a prescription for Xanax  3 times a day however she tells me she is only taking it once a day.  Chronic Pain syndrome:  sees pain clinic.  OSA: not on cpap. Intolerant it.   COPD: on trelegy one inhalation daily. Sees Dr. Corita Diego.     01/06/2024    2:07  PM 09/16/2023    3:17 PM 09/09/2023    9:51 AM 06/10/2023   11:44 AM 06/10/2023   11:04 AM  Depression screen PHQ 2/9  Decreased Interest 3 0 0  0  Down, Depressed, Hopeless 3 0 0  0  PHQ - 2 Score 6 0 0  0  Altered sleeping 3 0 0 2   Tired, decreased energy 3 0 0 3   Change in appetite 2 0 0 0   Feeling bad or failure about yourself  3 0 0 3   Trouble concentrating 3 0 0 3   Moving slowly or fidgety/restless 0 0 0 0   Suicidal thoughts 0 0 0 0   PHQ-9 Score 20 0 0    Difficult doing work/chores Very difficult Not difficult at all Not difficult at all Somewhat difficult         09/16/2023    3:17 PM  Fall Risk   Falls in the past year? 1  Number falls in past yr: 1  Injury with Fall? 1  Risk for fall due to : History of fall(s)  Follow up Falls evaluation completed    Patient Care Team: Mercy Stall, MD as PCP - General (Family Medicine) [name], [name], MD as PCP - Cardiology (Cardiology) Chodri, Tanvir, MD as Referring Physician (Specialist)   Review of Systems  Constitutional:  Negative for chills, fatigue and fever.  HENT:  Negative for congestion, ear pain, rhinorrhea and sore throat.   Respiratory:  Positive for cough and shortness of breath.   Cardiovascular:  Negative for chest pain.  Gastrointestinal:  Negative for abdominal pain, constipation, diarrhea, nausea and vomiting.  Genitourinary:        Still c/o bladder issues-feels like she is sticking to her pull up and says she has a yeast infection.  Musculoskeletal:  Negative for back pain and myalgias.  Neurological:  Negative for dizziness, weakness, light-headedness and headaches.  Psychiatric/Behavioral:  Negative for dysphoric mood. The patient is not nervous/anxious.     Current Outpatient Medications on File Prior to Visit  Medication Sig Dispense Refill   albuterol  (PROVENTIL ) (2.5 MG/3ML) 0.083% nebulizer solution Take 3 mLs (2.5 mg total) by nebulization every 6 (six) hours as needed for wheezing or  shortness of breath. 75 mL 2   albuterol  (VENTOLIN  HFA) 108 (90 Base) MCG/ACT inhaler Inhale 2 puffs into the lungs every 4 (four) hours as needed for wheezing or shortness of breath.     ALPRAZolam  (XANAX ) 0.5 MG tablet TAKE 1 TABLET BY MOUTH THREE TIMES A DAY 90 tablet 2   amLODipine  (NORVASC ) 5 MG tablet TAKE 1 TABLET BY MOUTH DAILY 30 tablet 3   Aspirin -Acetaminophen  (GOODYS BODY PAIN PO) Take 1 packet by mouth daily.     baclofen (LIORESAL) 10 MG tablet Take 10 mg by mouth 3 (three) times daily as needed.     blood glucose meter kit and supplies KIT Dispense based on patient and insurance preference. Use up to four times daily as directed. 1 each 0   buPROPion  (WELLBUTRIN  XL) 150 MG 24 hr tablet TAKE 1 TABLET BY MOUTH ONCE DAILY 30 tablet 10   chlorthalidone  (HYGROTON ) 25 MG tablet TAKE 1/2 TABLET BY MOUTH EVERY DAY 45 tablet 3   clopidogrel  (PLAVIX ) 75 MG tablet TAKE 1 TABLET BY MOUTH EVERY MORNING 30 tablet 10   ezetimibe  (ZETIA ) 10 MG tablet TAKE 1 TABLET BY MOUTH ONCE DAILY 30 tablet 10   fenofibrate  160 MG tablet TAKE 1 TABLET BY MOUTH EVERY MORNING 30 tablet 10   glucose blood (ONETOUCH ULTRA) test strip DISPENSE BASED ON PATIENT AND INSURANCE PREFERENCE. USE UP TO FOUR TIMES DAILY AS DIRECTED. 400 strip 3   lamoTRIgine  (LAMICTAL ) 25 MG tablet TAKE 2 TABLETS BY MOUTH EVERY DAY AT BEDTIME 60 tablet 10   lansoprazole  (PREVACID ) 30 MG capsule TAKE ONE CAPSULE BY MOUTH BEFORE BREAKFAST 90 capsule 3   mirabegron  ER (MYRBETRIQ ) 50 MG TB24 tablet Take 1 tablet (50 mg total) by mouth daily. 90 tablet 1   naloxone (NARCAN) nasal spray 4 mg/0.1 mL      nitroGLYCERIN  (NITROSTAT ) 0.4 MG SL tablet Dissolve 1 tab under tongue as needed for chest pain. May repeat every 5 minutes x 2 doses. If no relief call 9-1-1. 25 tablet 2   oxyCODONE -acetaminophen  (PERCOCET) 10-325 MG tablet Take 0.5 tablets by mouth in the morning and at bedtime.     rosuvastatin  (CRESTOR ) 20 MG tablet TAKE 1 TABLET BY MOUTH  EVERY DAY AT BEDTIME 30 tablet 10   Semaglutide , 2 MG/DOSE, (OZEMPIC , 2 MG/DOSE,) 8 MG/3ML SOPN Inject 2 mg into the skin once a week. 9 mL 1   torsemide  (DEMADEX ) 20 MG tablet Take 1 tablet (20 mg total) by mouth daily. 30 tablet 2   TRELEGY ELLIPTA 200-62.5-25 MCG/ACT AEPB Inhale 1 puff into the lungs daily.     TRUEplus Lancets 30G MISC 1 each by Does not apply route 2 (two) times daily. E11.69 400 each 3  valACYclovir  (VALTREX ) 1000 MG tablet TAKE 2 TABLETS BY MOUTH TWICE DAILY AS NEEDED 20 tablet 2   valsartan  (DIOVAN ) 160 MG tablet TAKE 1 TABLET BY MOUTH ONCE DAILY 30 tablet 10   valsartan  (DIOVAN ) 320 MG tablet TAKE 1 TABLET BY MOUTH EVERY DAY 90 tablet 1   VASCEPA  1 g capsule TAKE 2 CAPSULES BY MOUTH TWICE DAILY 120 capsule 10   No current facility-administered medications on file prior to visit.   Past Medical History:  Diagnosis Date   Anxiety    CAD (coronary artery disease)    a. NSTEMI 11/2008 s/p DES to LCx (3.0x12 Xience); b. NSTEMI 01/2010 secondary to thrombotic RCA lesion (non-obstructive)-->med rx (integrilin x 24 hrs + plavix ); c. 09/2012 negative Myoview .   Chronic diastolic CHF (congestive heart failure) (HCC)    a. 06/2014 Echo: EF 55-60%, no rwma, Gr1 DD, mild AI.   Depression    Dizziness    Drug induced constipation    Ganglion cyst of left foot 01/17/2021   Generalized hyperhidrosis    GERD (gastroesophageal reflux disease)    Headache    Hyperlipidemia    Hypertensive heart disease    Lumbar disc disease    Metabolic encephalopathy    Mixed hyperlipidemia    Myocardial infarction (HCC)    Obstructive sleep apnea    OP (osteoporosis)    Osteoarthritis    Osteoporosis    Other malaise    Overweight(278.02)    PAD (peripheral artery disease) (HCC)    a. Emboli to R foot 2010 from partially occlusive lesion in R EIA, s/p stenting. - followed by Dr. Shirley Douglas;  b. 10/2015 ABIs: R 1.03, L 0.97.   Restless leg    Sleep apnea    Stroke [facility])    TIA (transient  ischemic attack)    Tobacco abuse    Urge incontinence    Past Surgical History:  Procedure Laterality Date   CYST EXCISION Left 01/2021   left foot   EYE SURGERY     at age 70   hysterectomy -age 43     ILIAC ARTERY STENT     RIGHT ILIAC STENT   KNEE ARTHROSCOPY     LEFT HEART CATH AND CORONARY ANGIOGRAPHY N/A 03/05/2022   Procedure: LEFT HEART CATH AND CORONARY ANGIOGRAPHY;  Surgeon: Lucendia Rusk, MD;  Location: [facility] INVASIVE CV [facility];  Service: Cardiovascular;  Laterality: N/A;   LITHOTRIPSY Left 12/2020   LUMBAR LAMINECTOMY     TUBAL LIGATION      Family History  Problem Relation Age of Onset   Alzheimer's disease Mother    Hodgkin's lymphoma Brother    Lung cancer Brother    Hepatitis C Brother    Hypothyroidism Brother    Hypertension Brother    Depression Brother    Heart disease Other        Grandfather   Social History   Socioeconomic History   Marital status: Divorced    Spouse name: Not on file   Number of children: 3   Years of education: 10 th   Highest education level: Not on file  Occupational History   Occupation: DISABLED    Employer: UNEMPLOYED  Tobacco Use   Smoking status: Every Day    Current packs/day: 0.00    Average packs/day: 2.0 packs/day for 54.0 years (108.0 ttl pk-yrs)    Types: Cigarettes    Start date: 11/20/1958    Last attempt to quit: 11/20/2012    Years since quitting: 11.1  Smokeless tobacco: Never   Tobacco comments:    2 ppd for many years.     04/13/2022 Patient smokes 1/2 pack daily or more if she is nervous  Substance and Sexual Activity   Alcohol use: No    Alcohol/week: 0.0 standard drinks of alcohol   Drug use: No   Sexual activity: Never  Other Topics Concern   Not on file  Social History Narrative   Patient is single with 3 children, 1 deceased.   Patient is right handed.   Patient has 10 th grade education.   Patient drinks 5 or more cups daily.   Social Drivers of Corporate investment banker  Strain: Low Risk  (02/27/2023)   Overall Financial Resource Strain (CARDIA)    Difficulty of Paying Living Expenses: Not hard at all  Food Insecurity: No Food Insecurity (09/11/2023)   Received from [facility]   Hunger Vital Sign    Worried About Running Out of Food in the Last Year: Never true    Ran Out of Food in the Last Year: Never true  Transportation Needs: No Transportation Needs (09/11/2023)   Received from [facility] - Transportation    Lack of Transportation (Medical): No    Lack of Transportation (Non-Medical): No  Physical Activity: Inactive (09/16/2023)   Exercise Vital Sign    Days of Exercise per Week: 0 days    Minutes of Exercise per Session: 0 min  Stress: No Stress Concern Present (09/11/2023)   Received from [facility] - Occupational Stress Questionnaire    Feeling of Stress : Not at all  Social Connections: Socially Isolated (09/16/2023)   Social Connection and Isolation Panel [NHANES]    Frequency of Communication with Friends and Family: More than three times a week    Frequency of Social Gatherings with Friends and Family: Once a week    Attends Religious Services: Never    Database administrator or Organizations: No    Attends Engineer, structural: Never    Marital Status: Divorced    Objective:  BP 108/60   Pulse 66   Temp 97.8 F (36.6 C)   Ht 5\' 5"  (1.651 m)   Wt 156 lb (70.8 kg)   SpO2 96%   BMI 25.96 kg/m      01/06/2024    1:18 PM 01/01/2024   11:27 AM 11/11/2023    1:39 PM  BP/Weight  Systolic BP 108 100 112  Diastolic BP 60 60 68  Wt. (Lbs) 156 153.6 158  BMI 25.96 kg/m2 25.56 kg/m2 26.29 kg/m2    Physical Exam Vitals reviewed.  Constitutional:      Appearance: Normal appearance.  Neck:     Vascular: No carotid bruit.  Cardiovascular:     Rate and Rhythm: Normal rate and regular rhythm.     Heart sounds: Normal heart sounds.  Pulmonary:     Effort:  Pulmonary effort is normal. No respiratory distress.     Breath sounds: Normal breath sounds.  Abdominal:     General: Abdomen is flat. Bowel sounds are normal.     Palpations: Abdomen is soft.     Tenderness: There is no abdominal tenderness.  Neurological:     Mental Status: She is alert and oriented to person, place, and time.  Psychiatric:        Mood and Affect: Mood normal.        Behavior: Behavior normal.  Diabetic Foot Exam - Simple   Simple Foot Form Visual Inspection See comments: Yes Sensation Testing Intact to touch and monofilament testing bilaterally: Yes Pulse Check Posterior Tibialis and Dorsalis pulse intact bilaterally: Yes Comments Mild ingrown great toenails BL.       Lab Results  Component Value Date   WBC 8.3 01/06/2024   HGB 12.9 01/06/2024   HCT 41.4 01/06/2024   PLT 270 01/06/2024   GLUCOSE 135 (H) 01/06/2024   CHOL 110 01/06/2024   TRIG 184 (H) 01/06/2024   HDL 40 01/06/2024   LDLCALC 40 01/06/2024   ALT 17 01/06/2024   AST 18 01/06/2024   NA 145 (H) 01/06/2024   K 4.3 01/06/2024   CL 107 (H) 01/06/2024   CREATININE 1.10 (H) 01/06/2024   BUN 29 (H) 01/06/2024   CO2 23 01/06/2024   TSH 0.734 04/26/2023   INR 1.0 01/31/2019   HGBA1C 7.1 (H) 01/06/2024   MICROALBUR neg 02/27/2021      Assessment & Plan:   Assessment and Plan   Chronic Obstructive Pulmonary Disease (COPD) Reports chronic coughing and shortness of breath, consistent with baseline COPD symptoms. Smoking contributes to respiratory symptoms. Scheduled to see Dr. Julianna Ochs for lung cancer screening and further management of COPD. - Continue current COPD management plan - Attend appointment with Dr. Julianna Ochs for lung cancer screening  Depression and Anxiety Reports severe depression and anxiety, not improved with current medications (Wellbutrin , Lamictal , Xanax ). Declined psychiatric referral, expressing skepticism about counseling effectiveness. - Review current  psychiatric medications - Encourage consideration of psychiatric referral  Hypertension Blood pressure readings have been within target range since January. Emphasized the importance of monitoring both systolic and diastolic pressures and advised to call if diastolic pressure exceeds 100 mmHg. - Continue current antihypertensive medications - Monitor blood pressure regularly  Urinary Tract Infection (UTI) Reports symptoms of burning and itching, suggestive of a UTI. Scheduled to see Dr. Gaccione for further evaluation and management. - Prescribe antibiotics for UTI - Attend appointment with Dr. Aneta Bar  Ingrown Toenails Sore ingrown toenails on both big toes, possibly infected. Ingrown toenail removal previously performed by Dr. Elin Guan. - Prescribe antibiotics for potential infection - Refer to podiatrist for evaluation and management of ingrown toenails   Hypertensive heart disease with chronic combined systolic and diastolic congestive heart failure (HCC) Assessment & Plan: Blood pressure readings have been within target range since January. Emphasized the importance of monitoring both systolic and diastolic pressures and advised to call if diastolic pressure exceeds 100 mmHg. - Continue current antihypertensive medications - Monitor blood pressure regularly  Orders: -     CBC with Differential/Platelet -     Comprehensive metabolic panel with GFR  Diabetic polyneuropathy associated with type 2 diabetes mellitus (HCC) Assessment & Plan: Control: good Recommend check sugars fasting daily. Recommend check feet daily. Recommend annual eye exams. Medicines: Continue ozempic  2 mg weekly. Continue to work on eating a healthy diet and exercise.  Labs drawn today    Orders: -     Hemoglobin A1c -     Microalbumin / creatinine urine ratio  Coronary artery disease involving native coronary artery of native heart without angina pectoris Assessment & Plan: Management per  specialist.  Continue plavix  and crestor , fenofibrate , and zetia .   COPD mixed type [facility]) Assessment & Plan: Reports chronic coughing and shortness of breath, consistent with baseline COPD symptoms. Smoking contributes to respiratory symptoms. Scheduled to see Dr. Julianna Ochs for lung cancer screening and further management of COPD. -  Continue current COPD management plan - Attend appointment with Dr. Julianna Ochs for lung cancer screening   Mixed hyperlipidemia Assessment & Plan: Well controlled.  No changes to medicines. Continue fenofibrate  160 mg daily, rosuvastatin  20 mg daily. Vascepa  1 gm 2 capsules twice daily, and zetia  10 mg daily.   Continue to work on eating a healthy diet and exercise.  Labs drawn today  Orders: -     Lipid panel  Ingrown toenail of both feet Assessment & Plan: Sore ingrown toenails on both big toes, possibly infected. Ingrown toenail removal previously performed by Dr. Elin Guan. - Prescribe antibiotics for potential infection - Refer to podiatrist for evaluation and management of ingrown toenails  Orders: -     Ambulatory referral to Podiatry  Dysuria Assessment & Plan: Reports symptoms of burning and itching, suggestive of a UTI. Scheduled to see Dr. Gaccione for further evaluation and management. - Prescribe antibiotics for UTI - Attend appointment with Dr. Aneta Bar  Orders: -     POCT URINALYSIS DIP (CLINITEK) -     Urine Culture  Severe episode of recurrent major depressive disorder, without psychotic features (HCC) Assessment & Plan: Reports severe depression and anxiety, not improved with current medications (Wellbutrin , Lamictal , Xanax ). Declined psychiatric referral, expressing skepticism about counseling effectiveness. - Review current psychiatric medications - Encourage consideration of psychiatric referral   Other orders -     Fluconazole ; Take 1 tablet (150 mg total) by mouth daily.  Dispense: 2 tablet; Refill: 0 -     Cephalexin ; Take  1 capsule (500 mg total) by mouth 2 (two) times daily.  Dispense: 14 capsule; Refill: 0     Meds ordered this encounter  Medications   fluconazole  (DIFLUCAN ) 150 MG tablet    Sig: Take 1 tablet (150 mg total) by mouth daily.    Dispense:  2 tablet    Refill:  0   cephALEXin  (KEFLEX ) 500 MG capsule    Sig: Take 1 capsule (500 mg total) by mouth 2 (two) times daily.    Dispense:  14 capsule    Refill:  0    Orders Placed This Encounter  Procedures   Urine Culture   CBC with Differential/Platelet   Comprehensive metabolic panel with GFR   Lipid panel   Hemoglobin A1c   Microalbumin / creatinine urine ratio   Ambulatory referral to Podiatry   POCT URINALYSIS DIP (CLINITEK)     Follow-up: Return in about 3 months (around 04/06/2024) for chronic follow up.   I,Marla I Leal-Borjas,acting as a scribe for Mercy Stall, MD.,have documented all relevant documentation on the behalf of Mercy Stall, MD,as directed by  Mercy Stall, MD while in the presence of Mercy Stall, MD.   An After Visit Summary was printed and given to the patient.  Mercy Stall, MD [facility] [phone]

## 2024-01-06 ENCOUNTER — Ambulatory Visit (INDEPENDENT_AMBULATORY_CARE_PROVIDER_SITE_OTHER): Payer: Medicare Other | Admitting: Family Medicine

## 2024-01-06 ENCOUNTER — Telehealth: Payer: Self-pay | Admitting: Family Medicine

## 2024-01-06 VITALS — BP 108/60 | HR 66 | Temp 97.8°F | Ht 65.0 in | Wt 156.0 lb

## 2024-01-06 DIAGNOSIS — J449 Chronic obstructive pulmonary disease, unspecified: Secondary | ICD-10-CM | POA: Diagnosis not present

## 2024-01-06 DIAGNOSIS — E782 Mixed hyperlipidemia: Secondary | ICD-10-CM

## 2024-01-06 DIAGNOSIS — I110 Hypertensive heart disease with heart failure: Secondary | ICD-10-CM

## 2024-01-06 DIAGNOSIS — L600 Ingrowing nail: Secondary | ICD-10-CM | POA: Insufficient documentation

## 2024-01-06 DIAGNOSIS — I2510 Atherosclerotic heart disease of native coronary artery without angina pectoris: Secondary | ICD-10-CM | POA: Diagnosis not present

## 2024-01-06 DIAGNOSIS — I5042 Chronic combined systolic (congestive) and diastolic (congestive) heart failure: Secondary | ICD-10-CM

## 2024-01-06 DIAGNOSIS — F332 Major depressive disorder, recurrent severe without psychotic features: Secondary | ICD-10-CM

## 2024-01-06 DIAGNOSIS — R300 Dysuria: Secondary | ICD-10-CM

## 2024-01-06 DIAGNOSIS — B3731 Acute candidiasis of vulva and vagina: Secondary | ICD-10-CM

## 2024-01-06 DIAGNOSIS — E1142 Type 2 diabetes mellitus with diabetic polyneuropathy: Secondary | ICD-10-CM | POA: Diagnosis not present

## 2024-01-06 LAB — POCT URINALYSIS DIP (CLINITEK)
Bilirubin, UA: NEGATIVE
Blood, UA: NEGATIVE
Glucose, UA: NEGATIVE mg/dL
Ketones, POC UA: NEGATIVE mg/dL
Nitrite, UA: NEGATIVE
POC PROTEIN,UA: NEGATIVE
Spec Grav, UA: 1.015 (ref 1.010–1.025)
Urobilinogen, UA: 0.2 U/dL
pH, UA: 6 (ref 5.0–8.0)

## 2024-01-06 MED ORDER — CEPHALEXIN 500 MG PO CAPS: 500.0000 mg | ORAL_CAPSULE | Freq: Two times a day (BID) | ORAL | 0 refills | Status: DC

## 2024-01-06 MED ORDER — FLUCONAZOLE 150 MG PO TABS: 150.0000 mg | ORAL_TABLET | Freq: Every day | ORAL | 0 refills | Status: DC

## 2024-01-06 NOTE — Telephone Encounter (Signed)
 Error

## 2024-01-07 ENCOUNTER — Encounter: Payer: Self-pay | Admitting: Family Medicine

## 2024-01-07 LAB — CBC WITH DIFFERENTIAL/PLATELET
Basophils Absolute: 0.1 10*3/uL (ref 0.0–0.2)
Basos: 1 %
EOS (ABSOLUTE): 0.1 10*3/uL (ref 0.0–0.4)
Eos: 1 %
Hematocrit: 41.4 % (ref 34.0–46.6)
Hemoglobin: 12.9 g/dL (ref 11.1–15.9)
Immature Grans (Abs): 0.1 10*3/uL (ref 0.0–0.1)
Immature Granulocytes: 1 %
Lymphocytes Absolute: 2.1 10*3/uL (ref 0.7–3.1)
Lymphs: 26 %
MCH: 28.4 pg (ref 26.6–33.0)
MCHC: 31.2 g/dL — ABNORMAL LOW (ref 31.5–35.7)
MCV: 91 fL (ref 79–97)
Monocytes Absolute: 0.7 10*3/uL (ref 0.1–0.9)
Monocytes: 9 %
Neutrophils Absolute: 5.2 10*3/uL (ref 1.4–7.0)
Neutrophils: 62 %
Platelets: 270 10*3/uL (ref 150–450)
RBC: 4.55 x10E6/uL (ref 3.77–5.28)
RDW: 12.7 % (ref 11.7–15.4)
WBC: 8.3 10*3/uL (ref 3.4–10.8)

## 2024-01-07 LAB — LIPID PANEL
Chol/HDL Ratio: 2.8 ratio (ref 0.0–4.4)
Cholesterol, Total: 110 mg/dL (ref 100–199)
HDL: 40 mg/dL (ref >39)
LDL Chol Calc (NIH): 40 mg/dL (ref 0–99)
Triglycerides: 184 mg/dL — ABNORMAL HIGH (ref 0–149)
VLDL Cholesterol Cal: 30 mg/dL (ref 5–40)

## 2024-01-07 LAB — MICROALBUMIN / CREATININE URINE RATIO
Creatinine, Urine: 34.1 mg/dL
Microalb/Creat Ratio: <9 mg/g{creat} (ref 0–29)
Microalbumin, Urine: <3 ug/mL

## 2024-01-07 LAB — COMPREHENSIVE METABOLIC PANEL WITH GFR
ALT: 17 IU/L (ref 0–32)
AST: 18 IU/L (ref 0–40)
Albumin: 4 g/dL (ref 3.8–4.8)
Alkaline Phosphatase: 66 IU/L (ref 44–121)
BUN/Creatinine Ratio: 26 (ref 12–28)
BUN: 29 mg/dL — ABNORMAL HIGH (ref 8–27)
Bilirubin Total: <0.2 mg/dL (ref 0.0–1.2)
CO2: 23 mmol/L (ref 20–29)
Calcium: 9.2 mg/dL (ref 8.7–10.3)
Chloride: 107 mmol/L — ABNORMAL HIGH (ref 96–106)
Creatinine, Ser: 1.1 mg/dL — ABNORMAL HIGH (ref 0.57–1.00)
Globulin, Total: 2.4 g/dL (ref 1.5–4.5)
Glucose: 135 mg/dL — ABNORMAL HIGH (ref 70–99)
Potassium: 4.3 mmol/L (ref 3.5–5.2)
Sodium: 145 mmol/L — ABNORMAL HIGH (ref 134–144)
Total Protein: 6.4 g/dL (ref 6.0–8.5)
eGFR: 53 mL/min/{1.73_m2} — ABNORMAL LOW (ref >59)

## 2024-01-07 LAB — URINE CULTURE

## 2024-01-07 LAB — HEMOGLOBIN A1C
Est. average glucose Bld gHb Est-mCnc: 157 mg/dL
Hgb A1c MFr Bld: 7.1 % — ABNORMAL HIGH (ref 4.8–5.6)

## 2024-01-09 ENCOUNTER — Encounter: Payer: Self-pay | Admitting: Family Medicine

## 2024-01-10 ENCOUNTER — Encounter: Payer: Self-pay | Admitting: Cardiology

## 2024-01-10 NOTE — Assessment & Plan Note (Signed)
 Blood pressure readings have been within target range since January. Emphasized the importance of monitoring both systolic and diastolic pressures and advised to call if diastolic pressure exceeds 100 mmHg. - Continue current antihypertensive medications - Monitor blood pressure regularly

## 2024-01-10 NOTE — Assessment & Plan Note (Signed)
 Reports symptoms of burning and itching, suggestive of a UTI. Scheduled to see Dr. Gaccione for further evaluation and management. - Prescribe antibiotics for UTI - Attend appointment with Dr. Gaccione

## 2024-01-10 NOTE — Assessment & Plan Note (Signed)
 Reports severe depression and anxiety, not improved with current medications (Wellbutrin , Lamictal , Xanax ). Declined psychiatric referral, expressing skepticism about counseling effectiveness. - Review current psychiatric medications - Encourage consideration of psychiatric referral

## 2024-01-10 NOTE — Assessment & Plan Note (Signed)
 Well controlled.  No changes to medicines. Continue fenofibrate  160 mg daily, rosuvastatin  20 mg daily. Vascepa  1 gm 2 capsules twice daily, and zetia  10 mg daily.   Continue to work on eating a healthy diet and exercise.  Labs drawn today

## 2024-01-10 NOTE — Assessment & Plan Note (Signed)
 Management per specialist.  Continue plavix and crestor, fenofibrate, and zetia.

## 2024-01-10 NOTE — Assessment & Plan Note (Signed)
 Control: good Recommend check sugars fasting daily. Recommend check feet daily. Recommend annual eye exams. Medicines: Continue ozempic  2 mg weekly. Continue to work on eating a healthy diet and exercise.  Labs drawn today

## 2024-01-10 NOTE — Assessment & Plan Note (Signed)
 Sore ingrown toenails on both big toes, possibly infected. Ingrown toenail removal previously performed by Dr. Elin Guan. - Prescribe antibiotics for potential infection - Refer to podiatrist for evaluation and management of ingrown toenails

## 2024-01-10 NOTE — Assessment & Plan Note (Signed)
 Reports chronic coughing and shortness of breath, consistent with baseline COPD symptoms. Smoking contributes to respiratory symptoms. Scheduled to see Dr. Corita Diego for lung cancer screening and further management of COPD. - Continue current COPD management plan

## 2024-01-12 NOTE — Assessment & Plan Note (Signed)
Prescription fluconazole

## 2024-01-15 ENCOUNTER — Other Ambulatory Visit: Payer: Self-pay | Admitting: Family Medicine

## 2024-01-15 DIAGNOSIS — I110 Hypertensive heart disease with heart failure: Secondary | ICD-10-CM

## 2024-01-29 ENCOUNTER — Telehealth: Payer: Self-pay

## 2024-01-29 NOTE — Telephone Encounter (Signed)
 Please call this patient to schedule the next AWV appointment with Delford Felling, FNP Last date of AWV: 09/16/2023

## 2024-01-30 NOTE — Telephone Encounter (Signed)
 Contacted Margert Sheerer to schedule their annual wellness visit. Appointment made for 09/21/2024. Appointment is scheduled with Dr. Reinhold Carbine

## 2024-02-13 ENCOUNTER — Other Ambulatory Visit: Payer: Self-pay | Admitting: Family Medicine

## 2024-02-13 DIAGNOSIS — R5383 Other fatigue: Secondary | ICD-10-CM

## 2024-03-18 NOTE — Progress Notes (Signed)
 Cardiology Office Note    Date:  03/23/2024   ID:  Sonya Small, Sonya Small [birth year], MRN [id number]  PCP:  Sherre Clapper, MD  Cardiologist:  Dr. [name]  No chief complaint on file.   History of Present Illness:  Sonya Small is a 73 y.o. female with PMH of HTN, HLD, OSA, PAD, CVA, chronic diastolic heart failure and CAD.  Patient had a NSTEMI in March 2010 and underwent DES to left circumflex.  She had another NSTEMI in May 2011 secondary to thrombotic RCA lesion, cardiac catheterization showed nonobstructive disease, medical therapy was recommended.  She was admitted for recurrent chest pain in January 2014, Myoview  was negative.  She was admitted for acute CVA in October 2015 after presenting with slurred speech and facial drooping.  MRI showed acute right MCA infarct involving basal ganglia and periventricular white matter.  She was started on aspirin  along with Plavix .  Myoview  obtained in August 2017 showed no ischemia, normal EF.   ABI obtained in July 2018 was normal.    She was seen by Scot Ford PA-C on 03/13/2018  with chest pain.  A Myoview  study on 03/25/2018 which showed EF 52%, small sized moderate intensity fixed apical perfusion defect likely attenuation artifact was seen, no reversible ischemia otherwise.  Overall this is a low risk stress test.   She underwent cardiac cath in October 2023 which showed no significant obstructive CAD. She also had follow up LE arterial vascular dopplers which were stable with good ABIs.   She was seen by Damien Braver NP on 12/28/22. Noted BP was low. Lisinopril  HCT was cut in half. Later when seen by Dr Sherre this was discontinued. Now on torsemide  10 mg daily for leg swelling. Wearing compression hose. Notes she is sleeping all the time. Has seen pulmonary in [place]. Had sleep study per her report and they recommended CPAP which she could not tolerate. They are now recommending oxygen at night now. She is still smoking.   Was seen in June 2024 for  complaints of swelling. BNP was normal. Echo showed good cardiac function.  On follow up today she is doing OK. SOB is stable. No chest pain. BP has been excellent. LE edema well controlled with diuretics and compression hose. Still smoking. No longer using oxygen at night.     Past Medical History:  Diagnosis Date   Anxiety    CAD (coronary artery disease)    a. NSTEMI 11/2008 s/p DES to LCx (3.0x12 Xience); b. NSTEMI 01/2010 secondary to thrombotic RCA lesion (non-obstructive)-->med rx (integrilin x 24 hrs + plavix ); c. 09/2012 negative Myoview .   Chronic diastolic CHF (congestive heart failure) (HCC)    a. 06/2014 Echo: EF 55-60%, no rwma, Gr1 DD, mild AI.   Depression    Dizziness    Drug induced constipation    Ganglion cyst of left foot 01/17/2021   Generalized hyperhidrosis    GERD (gastroesophageal reflux disease)    Headache    Hyperlipidemia    Hypertensive heart disease    Lumbar disc disease    Metabolic encephalopathy    Mixed hyperlipidemia    Myocardial infarction (HCC)    Obstructive sleep apnea    OP (osteoporosis)    Osteoarthritis    Osteoporosis    Other malaise    Overweight(278.02)    PAD (peripheral artery disease) (HCC)    a. Emboli to R foot 2010 from partially occlusive lesion in R EIA, s/p stenting. - followed by Dr. Oris;  b. 10/2015 ABIs: R 1.03, L 0.97.   Restless leg    Sleep apnea    Stroke [facility])    TIA (transient ischemic attack)    Tobacco abuse    Urge incontinence     Past Surgical History:  Procedure Laterality Date   CYST EXCISION Left 01/2021   left foot   EYE SURGERY     at age 70   hysterectomy -age 83     ILIAC ARTERY STENT     RIGHT ILIAC STENT   KNEE ARTHROSCOPY     LEFT HEART CATH AND CORONARY ANGIOGRAPHY N/A 03/05/2022   Procedure: LEFT HEART CATH AND CORONARY ANGIOGRAPHY;  Surgeon: Dann Candyce RAMAN, MD;  Location: [facility];  Service: Cardiovascular;  Laterality: N/A;   LITHOTRIPSY Left 12/2020   LUMBAR  LAMINECTOMY     TUBAL LIGATION      Current Medications: Outpatient Medications Prior to Visit  Medication Sig Dispense Refill   albuterol  (PROVENTIL ) (2.5 MG/3ML) 0.083% nebulizer solution Take 3 mLs (2.5 mg total) by nebulization every 6 (six) hours as needed for wheezing or shortness of breath. 75 mL 2   albuterol  (VENTOLIN  HFA) 108 (90 Base) MCG/ACT inhaler Inhale 2 puffs into the lungs every 4 (four) hours as needed for wheezing or shortness of breath.     ALPRAZolam  (XANAX ) 0.5 MG tablet TAKE 1 TABLET BY MOUTH 3 TIMES DAILY 90 tablet 3   amLODipine  (NORVASC ) 5 MG tablet TAKE 1 TABLET BY MOUTH DAILY 30 tablet 11   Aspirin -Acetaminophen  (GOODYS BODY PAIN PO) Take 1 packet by mouth daily.     baclofen (LIORESAL) 10 MG tablet Take 10 mg by mouth 3 (three) times daily as needed.     blood glucose meter kit and supplies KIT Dispense based on patient and insurance preference. Use up to four times daily as directed. 1 each 0   buPROPion  (WELLBUTRIN  XL) 150 MG 24 hr tablet TAKE 1 TABLET BY MOUTH ONCE DAILY 30 tablet 10   chlorthalidone  (HYGROTON ) 25 MG tablet TAKE 1/2 TABLET BY MOUTH EVERY DAY 45 tablet 3   clopidogrel  (PLAVIX ) 75 MG tablet TAKE 1 TABLET BY MOUTH EVERY MORNING 30 tablet 10   ezetimibe  (ZETIA ) 10 MG tablet TAKE 1 TABLET BY MOUTH ONCE DAILY 30 tablet 10   fenofibrate  160 MG tablet TAKE 1 TABLET BY MOUTH EVERY MORNING 30 tablet 10   fluconazole  (DIFLUCAN ) 150 MG tablet Take 1 tablet (150 mg total) by mouth daily. 2 tablet 0   glucose blood (ONETOUCH ULTRA) test strip DISPENSE BASED ON PATIENT AND INSURANCE PREFERENCE. USE UP TO FOUR TIMES DAILY AS DIRECTED. 400 strip 3   lamoTRIgine  (LAMICTAL ) 25 MG tablet TAKE 2 TABLETS BY MOUTH EVERY DAY AT BEDTIME 60 tablet 10   lansoprazole  (PREVACID ) 30 MG capsule TAKE 1 CAPSULE BY MOUTH DAILY BEFORE BREAKFAST 30 capsule 2   mirabegron  ER (MYRBETRIQ ) 50 MG TB24 tablet Take 1 tablet (50 mg total) by mouth daily. 90 tablet 1   naloxone (NARCAN)  nasal spray 4 mg/0.1 mL      nitroGLYCERIN  (NITROSTAT ) 0.4 MG SL tablet Dissolve 1 tab under tongue as needed for chest pain. May repeat every 5 minutes x 2 doses. If no relief call 9-1-1. 25 tablet 2   oxyCODONE -acetaminophen  (PERCOCET) 10-325 MG tablet Take 0.5 tablets by mouth in the morning and at bedtime.     rosuvastatin  (CRESTOR ) 20 MG tablet TAKE 1 TABLET BY MOUTH EVERY DAY AT BEDTIME 30 tablet 10  Semaglutide , 2 MG/DOSE, (OZEMPIC , 2 MG/DOSE,) 8 MG/3ML SOPN Inject 2 mg into the skin once a week. 9 mL 1   torsemide  (DEMADEX ) 20 MG tablet TAKE 1 TABLET BY MOUTH DAILY 30 tablet 2   TRELEGY ELLIPTA 200-62.5-25 MCG/ACT AEPB Inhale 1 puff into the lungs daily.     TRUEplus Lancets 30G MISC 1 each by Does not apply route 2 (two) times daily. E11.69 400 each 3   valACYclovir  (VALTREX ) 1000 MG tablet TAKE 2 TABLETS BY MOUTH TWICE DAILY AS NEEDED 20 tablet 2   valsartan  (DIOVAN ) 160 MG tablet TAKE 1 TABLET BY MOUTH ONCE DAILY 30 tablet 10   valsartan  (DIOVAN ) 320 MG tablet TAKE 1 TABLET BY MOUTH EVERY DAY 90 tablet 1   VASCEPA  1 g capsule TAKE 2 CAPSULES BY MOUTH TWICE DAILY 120 capsule 10   cephALEXin  (KEFLEX ) 500 MG capsule Take 1 capsule (500 mg total) by mouth 2 (two) times daily. 14 capsule 0   No facility-administered medications prior to visit.     Allergies:   Latex and Abilify  [aripiprazole ]   Social History   Socioeconomic History   Marital status: Divorced    Spouse name: Not on file   Number of children: 3   Years of education: 10 th   Highest education level: Not on file  Occupational History   Occupation: DISABLED    Employer: UNEMPLOYED  Tobacco Use   Smoking status: Every Day    Current packs/day: 0.00    Average packs/day: 2.0 packs/day for 54.0 years (108.0 ttl pk-yrs)    Types: Cigarettes    Start date: 11/20/1958    Last attempt to quit: 11/20/2012    Years since quitting: 11.3   Smokeless tobacco: Never   Tobacco comments:    2 ppd for many years.      04/13/2022 Patient smokes 1/2 pack daily or more if she is nervous  Substance and Sexual Activity   Alcohol use: No    Alcohol/week: 0.0 standard drinks of alcohol   Drug use: No   Sexual activity: Never  Other Topics Concern   Not on file  Social History Narrative   Patient is single with 3 children, 1 deceased.   Patient is right handed.   Patient has 10 th grade education.   Patient drinks 5 or more cups daily.   Social Drivers of Corporate investment banker Strain: Low Risk  (02/27/2023)   Overall Financial Resource Strain (CARDIA)    Difficulty of Paying Living Expenses: Not hard at all  Food Insecurity: No Food Insecurity (09/11/2023)   [facility] from [facility]   Hunger Vital Sign    Within the past 12 months, you worried that your food would run out before you got the money to buy more.: Never true    Within the past 12 months, the food you bought just didn't last and you didn't have money to get more.: Never true  Transportation Needs: No Transportation Needs (09/11/2023)   Received from [facility] - Transportation    Lack of Transportation (Medical): No    Lack of Transportation (Non-Medical): No  Physical Activity: Inactive (09/16/2023)   Exercise Vital Sign    Days of Exercise per Week: 0 days    Minutes of Exercise per Session: 0 min  Stress: No Stress Concern Present (09/11/2023)   Received from [facility] - Occupational Stress Questionnaire    Feeling of Stress : Not  at all  Social Connections: Socially Isolated (09/16/2023)   Social Connection and Isolation Panel    Frequency of Communication with Friends and Family: More than three times a week    Frequency of Social Gatherings with Friends and Family: Once a week    Attends Religious Services: Never    Database administrator or Organizations: No    Attends Engineer, structural: Never    Marital Status: Divorced     Family History:  The  patient's family history includes Alzheimer's disease in her mother; Depression in her brother; Heart disease in an other family member; Hepatitis C in her brother; Hodgkin's lymphoma in her brother; Hypertension in her brother; Hypothyroidism in her brother; Lung cancer in her brother.   ROS:   Please see the history of present illness.    ROS All other systems reviewed and are negative.   PHYSICAL EXAM:   VS:  BP 120/60 (BP Location: Left Arm, Patient Position: Sitting)   Pulse 88   Ht 5' 5 (1.651 m)   Wt 150 lb 9.6 oz (68.3 kg)   SpO2 98%   BMI 25.06 kg/m    GEN: Well nourished, well developed, in no acute distress  HEENT: normal  Neck: no JVD, carotid bruits, or masses Cardiac: RRR; no murmurs, rubs, or gallops,no edema. Pedal pulses are palpable Respiratory:  clear to auscultation bilaterally, normal work of breathing GI: soft, nontender, nondistended, + BS MS: no deformity or atrophy  Legs: compression hose in place. No edema.  Skin: warm and dry, no rash Neuro:  Alert and Oriented x 3, Strength and sensation are intact Psych: euthymic mood, full affect  Wt Readings from Last 3 Encounters:  03/23/24 150 lb 9.6 oz (68.3 kg)  01/06/24 156 lb (70.8 kg)  01/01/24 153 lb 9.6 oz (69.7 kg)      Studies/Labs Reviewed:   EKG:  EKG is not ordered today    Recent Labs: 04/26/2023: TSH 0.734 01/06/2024: ALT 17; BUN 29; Creatinine, Ser 1.10; Hemoglobin 12.9; Platelets 270; Potassium 4.3; Sodium 145   Lipid Panel    Component Value Date/Time   CHOL 110 01/06/2024 1413   TRIG 184 (H) 01/06/2024 1413   HDL 40 01/06/2024 1413   CHOLHDL 2.8 01/06/2024 1413   CHOLHDL 5.0 07/13/2014 0108   VLDL 42 (H) 07/13/2014 0108   LDLCALC 40 01/06/2024 1413   Labs dated 06/17/18: CBC normal except for elevated WBC 13.3.  Creatinine 1.14. Potassium 5.5.  Dated 05/23/18: cholesterol 142, triglycerides 212, HDL 35, LDL 65.  Dated 07/29/18: Normal chemistries and CBC.  Dated 09/02/19:  cholesterol 165, triglycerides 146, HDL 49, LDL 62. A1c 7%. Creatinine 0.56. potassium 3.3. CBC and TSH normal. Dated 01/06/24: cholesterol 110, triglycerides 184, HDL 40, LDL 40. A1c 7.1%. CMET and CBC normal.   Additional studies/ records that were reviewed today include:   Myoview  03/25/2018 The left ventricular ejection fraction is mildly decreased (45-54%). Nuclear stress EF: 52%. No T wave inversion was noted during stress. There was no ST segment deviation noted during stress. Defect 1: There is a small defect of moderate severity. This is a low risk study.   Small size, moderate intensity fixed apical perfusion defect, likely attenuation artifact. No reversible ischemia. LVEF 52% with normal wall motion. This is a low risk study.  Myoview  08/22/21: Study Highlights      Findings are consistent with no prior ischemia. The study is low risk.   No ST deviation was noted.  LV perfusion is abnormal. Defect 1: There is a small defect with mild reduction in uptake present in the apical apex location(s) that is fixed. There is normal wall motion in the defect area. Consistent with artifact caused by breast attenuation.   Left ventricular function is normal. End diastolic cavity size is normal. End systolic cavity size is normal.   Prior study available for comparison from 03/25/2018. No changes compared to prior study. LVEF 52%, fixed apical attenuation artifact   Small size, mild intensity fixed apical perfusion defect, likely attenuation artifact. No reversible ischemia. LVEF 52% with normal wall motion. This is a low risk study. Compared to a prior study in 2019, there are no changes (the apical perfusion defect was previously reported) - LVEF is unchanged.   Cardiac cath 03/05/22:   LEFT HEART CATH AND CORONARY ANGIOGRAPHY   Conclusion      1st Mrg lesion is 25% stenosed.   Previously placed Mid Cx stent of unknown type is  widely patent.   The left ventricular systolic function is  normal.   LV end diastolic pressure is mildly elevated.   The left ventricular ejection fraction is 45-50% by visual estimate.  Apical hypokinesis consistent with Takotsubo cardiomyopathy.   There is no aortic valve stenosis.   No significant coronary artery disease.  She does have apical hypokinesis which is consistent with a Takotsubo cardiomyopathy pattern.  She has been under a lot of stress of late.  The apical hypokinesis is minimal so I suspect her Takotsubo is in the process of resolving.  Continue aggressive blood pressure control.  Consider echocardiogram in 6 weeks.   LVEDP was mildly elevated.  Could consider short-term diuretic as well if she has significant shortness of breath.  Coronary Diagrams  Diagnostic Dominance: Right  Intervention  Echo 04/09/23: IMPRESSIONS     1. Left ventricular ejection fraction, by estimation, is 55 to 60%. The  left ventricle has normal function. The left ventricle has no regional  wall motion abnormalities. There is mild asymmetric left ventricular  hypertrophy of the septal segment. Left  ventricular diastolic parameters are consistent with Grade I diastolic  dysfunction (impaired relaxation). The average left ventricular global  longitudinal strain is -14.1 %. The global longitudinal strain is  abnormal.   2. Right ventricular systolic function is normal. The right ventricular  size is normal. There is normal pulmonary artery systolic pressure.   3. The mitral valve is normal in structure. Mild mitral valve  regurgitation. No evidence of mitral stenosis.   4. The aortic valve is normal in structure. Aortic valve regurgitation is  mild. No aortic stenosis is present.   5. Aneurysm of the ascending aorta, measuring 40 mm.   6. The inferior vena cava is normal in size with greater than 50%  respiratory variability, suggesting right atrial pressure of 3 mmHg.   ASSESSMENT:    1. Coronary artery disease involving native coronary artery  of native heart without angina pectoris   2. Essential hypertension   3. Hyperlipidemia LDL goal <70   4. Tobacco abuse         PLAN:  In order of problems listed above:  CAD: remote stent of mid LCx in 2010 with 3.0 x 12 DES. In 2011 had NSTEMI related to thrombotic event in distal RCA- nonobstructive. Myoview  in November 2022  was normal.  Cardiac cath in June 2023  was unremarkable. Continue aspirin  and Plavix . No significant angina.  Hypertension: Blood pressure is well controlled.  Carotid artery disease: She has a history of mild carotid artery disease.  LE edema multifactorial with Chronic diastolic heart failure and venous insufficiency. Sodium restriction. Elevation of feet, compression hose.   5.   Dyslipidemia. On Crestor  and Vascepa . LDL 40 -excellent.   6.   Tobacco abuse. Recommend smoking cessation.   7.  PAD. Stable LE vascular studies.   8.  OSA   Will follow up 6-8 months.     Medication Adjustments/Labs and Tests Ordered: Current medicines are reviewed at length with the patient today.  Concerns regarding medicines are outlined above.  Medication changes, Labs and Tests ordered today are listed in the Patient Instructions below. There are no Patient Instructions on file for this visit.    Signed, Annalise Mcdiarmid [name], MD  03/23/2024 9:28 AM    [facility] [address] [place], [place], [place]  [address] Phone: [phone]; Fax: [phone]

## 2024-03-23 ENCOUNTER — Encounter: Payer: Self-pay | Admitting: Cardiology

## 2024-03-23 ENCOUNTER — Ambulatory Visit: Attending: Cardiology | Admitting: Cardiology

## 2024-03-23 VITALS — BP 120/60 | HR 88 | Ht 65.0 in | Wt 150.6 lb

## 2024-03-23 DIAGNOSIS — Z720 Tobacco use: Secondary | ICD-10-CM | POA: Diagnosis not present

## 2024-03-23 DIAGNOSIS — I2510 Atherosclerotic heart disease of native coronary artery without angina pectoris: Secondary | ICD-10-CM | POA: Insufficient documentation

## 2024-03-23 DIAGNOSIS — E785 Hyperlipidemia, unspecified: Secondary | ICD-10-CM | POA: Insufficient documentation

## 2024-03-23 DIAGNOSIS — I10 Essential (primary) hypertension: Secondary | ICD-10-CM | POA: Insufficient documentation

## 2024-03-23 NOTE — Patient Instructions (Addendum)
 Medication Instructions:  Continue same medications *If you need a refill on your cardiac medications before your next appointment, please call your pharmacy*  Lab Work: None ordered  Testing/Procedures: None ordered  Follow-Up: At [facility], you and your health needs are our priority.  As part of our continuing mission to provide you with exceptional heart care, our providers are all part of one team.  This team includes your primary Cardiologist (physician) and Advanced Practice Providers or APPs (Physician Assistants and Nurse Practitioners) who all work together to provide you with the care you need, when you need it.  Your next appointment:  6 months   Monday 12/8 at 10:20 am    Provider:  Dr.Jordan   We recommend signing up for the patient portal called MyChart.  Sign up information is provided on this After Visit Summary.  MyChart is used to connect with patients for Virtual Visits (Telemedicine).  Patients are able to view lab/test results, encounter notes, upcoming appointments, etc.  Non-urgent messages can be sent to your provider as well.   To learn more about what you can do with MyChart, go to [email or web address].

## 2024-04-07 ENCOUNTER — Ambulatory Visit (INDEPENDENT_AMBULATORY_CARE_PROVIDER_SITE_OTHER): Admitting: Family Medicine

## 2024-04-07 VITALS — BP 132/74 | HR 85 | Temp 97.8°F | Ht 65.0 in | Wt 151.0 lb

## 2024-04-07 DIAGNOSIS — F332 Major depressive disorder, recurrent severe without psychotic features: Secondary | ICD-10-CM

## 2024-04-07 DIAGNOSIS — R300 Dysuria: Secondary | ICD-10-CM | POA: Diagnosis not present

## 2024-04-07 DIAGNOSIS — E1142 Type 2 diabetes mellitus with diabetic polyneuropathy: Secondary | ICD-10-CM | POA: Diagnosis not present

## 2024-04-07 DIAGNOSIS — I110 Hypertensive heart disease with heart failure: Secondary | ICD-10-CM | POA: Diagnosis not present

## 2024-04-07 DIAGNOSIS — D352 Benign neoplasm of pituitary gland: Secondary | ICD-10-CM

## 2024-04-07 DIAGNOSIS — I2510 Atherosclerotic heart disease of native coronary artery without angina pectoris: Secondary | ICD-10-CM

## 2024-04-07 DIAGNOSIS — N3946 Mixed incontinence: Secondary | ICD-10-CM | POA: Diagnosis not present

## 2024-04-07 DIAGNOSIS — J449 Chronic obstructive pulmonary disease, unspecified: Secondary | ICD-10-CM

## 2024-04-07 DIAGNOSIS — R1110 Vomiting, unspecified: Secondary | ICD-10-CM

## 2024-04-07 DIAGNOSIS — I5042 Chronic combined systolic (congestive) and diastolic (congestive) heart failure: Secondary | ICD-10-CM

## 2024-04-07 DIAGNOSIS — N898 Other specified noninflammatory disorders of vagina: Secondary | ICD-10-CM

## 2024-04-07 DIAGNOSIS — E782 Mixed hyperlipidemia: Secondary | ICD-10-CM

## 2024-04-07 DIAGNOSIS — F3181 Bipolar II disorder: Secondary | ICD-10-CM

## 2024-04-07 NOTE — Patient Instructions (Signed)
 VISIT SUMMARY:  During your visit, we discussed your chronic morning vomiting, urinary incontinence, vaginal symptoms, diabetes management, and other health concerns. We have made several referrals and recommendations to help manage these issues.  YOUR PLAN:  MORNING VOMITING WITH PHLEGM: You have been experiencing daily morning vomiting with phlegm for over three months, likely due to sinus drainage. -Use Fluticasone  nasal spray regularly. -Consider over-the-counter antihistamines if the nasal spray does not help.  URINARY INCONTINENCE AND BLADDER PROLAPSE: You have persistent urinary incontinence despite using a pessary and Myrbetriq . -We will refer you to a urologist for further evaluation and management.  VAGINAL DISCHARGE AND ODOR: You have vaginal itching, discharge, and odor. -We will refer you to a urologist for evaluation and treatment options.  DIABETES MELLITUS: Your blood glucose levels are elevated, possibly due to high fruit intake. -We will discuss dietary modifications focusing on reducing fruit intake.  DEPRESSION: You have symptoms of depression that are affecting your overall health. -We will refer you to [facility] for psychiatric evaluation and counseling services.  GENERAL HEALTH MAINTENANCE: We reviewed your medications and need to monitor your health status with lab work. -We will order lab work to monitor your health status.

## 2024-04-07 NOTE — Progress Notes (Unsigned)
 Subjective:  Patient ID: Sonya Small, female    DOB: September 12, 1951  Age: 73 y.o. MRN: [id number]  Chief Complaint  Patient presents with   Medical Management of Chronic Issues    HPI:  Diabetes:  Ozempic  2 mg weekly. Last A1c 7.7.  Exercises twice a day.  She checks her feet daily.  She tries to eat healthy.  Sugars range June 120-207, July 104-200   Coronary artery disease/hypertensive congestive heart failure: Patient is currently taking valsartan  160 mg 1 pill daily, chlorthalidone  25 mg half pill daily, torsemide  20 mg 2 daily, amlodipine  5 mg daily.  Currently on fenofibrate  160 mg daily, rosuvastatin  20 mg daily, Vascepa  1 g 2 capsules twice daily, and Zetia  10 mg daily.  Patient is also currently taking Plavix  75 mg daily.  Patient has nitroglycerin .   Patient has continued dysthymia/severe depression.  Tried numerous medications without any improvement.  Patient is on Wellbutrin  XL 150 mg daily, Lamictal  25 mg 2 tablets at bedtime.  Patient has been receiving a prescription for Xanax  3 times a day however she tells me she is only taking it once a day.  She expresses a strong reluctance to see a psychiatrist. Refuses counseling.    Chronic Pain syndrome:  sees pain clinic.    OSA: not on cpap. Intolerant it.    COPD: on trelegy one inhalation daily. Sees Dr. Mardee.    Bladder incontinence: Seeing gynecology  Discussed the use of AI scribe software for clinical note transcription with the patient, who gave verbal consent to proceed.  History of Present Illness   The patient presents with chronic morning vomiting and urinary incontinence.  Chronic morning vomiting and upper respiratory symptoms - Daily morning vomiting for over three months, characterized by expulsion of phlegm rather than food - Phlegm is initially white, turning brown with continued expulsion - Phlegm is thick and requires removal with a paper towel - Eating club crackers temporarily alleviates vomiting -  No blood in sputum - Morning nasal congestion with drainage throughout the day - No current use of allergy medications; uncertain benefit from prior use  Urinary incontinence and bladder prolapse - Urinary incontinence with total loss of control once urination starts - Continued urgency and occasional leakage before reaching the bathroom despite pessary use - Pessary inserted by gynecologist for bladder prolapse, maintained every four months - Myrbetriq  50 mg for bladder control, obtained through [facility]  Vaginal symptoms - Vaginal itching and discharge - Odor present if prescribed vaginal freshener is not used regularly - Brown discharge on toilet paper inserted into vagina in the morning - Burning with urination and persistent itching  Fatigue, dyspnea, and dizziness - Persistent fatigue - Shortness of breath - Dizziness - No fevers, chills, sore throat, or abdominal pain  Diabetes mellitus management - Ozempic  for diabetes management - Blood glucose levels range from 104 to 207 mg/dL - Higher blood glucose readings attributed to fruit consumption rather than sweets  Bowel function - Stool softener taken twice daily - Improved bowel movements with current regimen  Headache and back pain - Headaches present - Back pain present - Pain medication used for symptom control  Ear and nasal symptoms - Ear fullness and itching - Occasional use of nasal spray for relief  Respiratory disease management - Trelegy used for respiratory issues           04/07/2024    1:57 PM 01/06/2024    2:07 PM 09/16/2023    3:17 PM  09/09/2023    9:51 AM 06/10/2023   11:44 AM  Depression screen PHQ 2/9  Decreased Interest 3 3 0 0   Down, Depressed, Hopeless 3 3 0 0   PHQ - 2 Score 6 6 0 0   Altered sleeping 3 3 0 0 2  Tired, decreased energy 3 3 0 0 3  Change in appetite 2 2 0 0 0  Feeling bad or failure about yourself  0 3 0 0 3  Trouble concentrating 1 3 0 0 3  Moving slowly or  fidgety/restless 0 0 0 0 0  Suicidal thoughts 0 0 0 0 0  PHQ-9 Score 15 20 0 0   Difficult doing work/chores Somewhat difficult Very difficult Not difficult at all Not difficult at all Somewhat difficult        04/07/2024    1:57 PM  Fall Risk   Falls in the past year? 1  Number falls in past yr: 1  Injury with Fall? 0  Risk for fall due to : Impaired balance/gait  Follow up Falls evaluation completed    Patient Care Team: Sherre Clapper, MD as PCP - General (Family Medicine) [name], [name], MD as PCP - Cardiology (Cardiology) Mardee Franc, MD as Referring Physician (Specialist) Estelle Derry SAUNDERS, MD as Referring Physician (Obstetrics and Gynecology)   Review of Systems  Constitutional:  Positive for fatigue. Negative for chills and fever.  HENT:  Positive for congestion, postnasal drip and rhinorrhea. Negative for ear pain and sore throat.   Respiratory:  Positive for shortness of breath. Negative for cough.        Sputum: white and then changes to brown. No blood.   Cardiovascular:  Negative for chest pain.  Gastrointestinal:  Positive for vomiting (daily. vomits phlegm). Negative for abdominal pain, constipation, diarrhea and nausea.  Genitourinary:  Positive for dysuria and urgency.       Full incontinence of urine.  Dr. Estelle (gynecology) for a pessary. Goes every 4 months for removal and cleaning and reinsertion.  Vaginal itching  Musculoskeletal:  Positive for back pain. Negative for myalgias.  Neurological:  Positive for dizziness and headaches. Negative for weakness and light-headedness.  Psychiatric/Behavioral:  Positive for dysphoric mood. The patient is nervous/anxious.     Current Outpatient Medications on File Prior to Visit  Medication Sig Dispense Refill   albuterol  (PROVENTIL ) (2.5 MG/3ML) 0.083% nebulizer solution Take 3 mLs (2.5 mg total) by nebulization every 6 (six) hours as needed for wheezing or shortness of breath. 75 mL 2   albuterol  (VENTOLIN   HFA) 108 (90 Base) MCG/ACT inhaler Inhale 2 puffs into the lungs every 4 (four) hours as needed for wheezing or shortness of breath.     ALPRAZolam  (XANAX ) 0.5 MG tablet TAKE 1 TABLET BY MOUTH 3 TIMES DAILY 90 tablet 3   amLODipine  (NORVASC ) 5 MG tablet TAKE 1 TABLET BY MOUTH DAILY 30 tablet 11   Aspirin -Acetaminophen  (GOODYS BODY PAIN PO) Take 1 packet by mouth daily.     baclofen (LIORESAL) 10 MG tablet Take 10 mg by mouth 3 (three) times daily as needed.     blood glucose meter kit and supplies KIT Dispense based on patient and insurance preference. Use up to four times daily as directed. 1 each 0   buPROPion  (WELLBUTRIN  XL) 150 MG 24 hr tablet TAKE 1 TABLET BY MOUTH ONCE DAILY 30 tablet 10   chlorthalidone  (HYGROTON ) 25 MG tablet TAKE 1/2 TABLET BY MOUTH EVERY DAY 45 tablet 3  clopidogrel  (PLAVIX ) 75 MG tablet TAKE 1 TABLET BY MOUTH EVERY MORNING 30 tablet 10   ezetimibe  (ZETIA ) 10 MG tablet TAKE 1 TABLET BY MOUTH ONCE DAILY 30 tablet 10   fenofibrate  160 MG tablet TAKE 1 TABLET BY MOUTH EVERY MORNING 30 tablet 10   glucose blood (ONETOUCH ULTRA) test strip DISPENSE BASED ON PATIENT AND INSURANCE PREFERENCE. USE UP TO FOUR TIMES DAILY AS DIRECTED. 400 strip 3   lamoTRIgine  (LAMICTAL ) 25 MG tablet TAKE 2 TABLETS BY MOUTH EVERY DAY AT BEDTIME 60 tablet 10   lansoprazole  (PREVACID ) 30 MG capsule TAKE 1 CAPSULE BY MOUTH DAILY BEFORE BREAKFAST 30 capsule 2   mirabegron  ER (MYRBETRIQ ) 50 MG TB24 tablet Take 1 tablet (50 mg total) by mouth daily. 90 tablet 1   naloxone (NARCAN) nasal spray 4 mg/0.1 mL      nitroGLYCERIN  (NITROSTAT ) 0.4 MG SL tablet Dissolve 1 tab under tongue as needed for chest pain. May repeat every 5 minutes x 2 doses. If no relief call 9-1-1. 25 tablet 2   oxyCODONE -acetaminophen  (PERCOCET) 10-325 MG tablet Take 0.5 tablets by mouth in the morning and at bedtime.     rosuvastatin  (CRESTOR ) 20 MG tablet TAKE 1 TABLET BY MOUTH EVERY DAY AT BEDTIME 30 tablet 10   Semaglutide , 2  MG/DOSE, (OZEMPIC , 2 MG/DOSE,) 8 MG/3ML SOPN Inject 2 mg into the skin once a week. 9 mL 1   torsemide  (DEMADEX ) 20 MG tablet TAKE 1 TABLET BY MOUTH DAILY 30 tablet 2   TRELEGY ELLIPTA 200-62.5-25 MCG/ACT AEPB Inhale 1 puff into the lungs daily.     TRUEplus Lancets 30G MISC 1 each by Does not apply route 2 (two) times daily. E11.69 400 each 3   valACYclovir  (VALTREX ) 1000 MG tablet TAKE 2 TABLETS BY MOUTH TWICE DAILY AS NEEDED 20 tablet 2   valsartan  (DIOVAN ) 160 MG tablet TAKE 1 TABLET BY MOUTH ONCE DAILY 30 tablet 10   valsartan  (DIOVAN ) 320 MG tablet TAKE 1 TABLET BY MOUTH EVERY DAY 90 tablet 1   VASCEPA  1 g capsule TAKE 2 CAPSULES BY MOUTH TWICE DAILY 120 capsule 10   No current facility-administered medications on file prior to visit.   Past Medical History:  Diagnosis Date   Anxiety    CAD (coronary artery disease)    a. NSTEMI 11/2008 s/p DES to LCx (3.0x12 Xience); b. NSTEMI 01/2010 secondary to thrombotic RCA lesion (non-obstructive)-->med rx (integrilin x 24 hrs + plavix ); c. 09/2012 negative Myoview .   Chronic diastolic CHF (congestive heart failure) (HCC)    a. 06/2014 Echo: EF 55-60%, no rwma, Gr1 DD, mild AI.   Depression    Dizziness    Drug induced constipation    Ganglion cyst of left foot 01/17/2021   Generalized hyperhidrosis    GERD (gastroesophageal reflux disease)    Headache    Hyperlipidemia    Hypertensive heart disease    Lumbar disc disease    Metabolic encephalopathy    Mixed hyperlipidemia    Myocardial infarction (HCC)    Obstructive sleep apnea    OP (osteoporosis)    Osteoarthritis    Osteoporosis    Other malaise    Overweight(278.02)    PAD (peripheral artery disease) (HCC)    a. Emboli to R foot 2010 from partially occlusive lesion in R EIA, s/p stenting. - followed by Dr. Oris;  b. 10/2015 ABIs: R 1.03, L 0.97.   Restless leg    Sleep apnea    Stroke (HCC)  TIA (transient ischemic attack)    Tobacco abuse    Urge incontinence    Past  Surgical History:  Procedure Laterality Date   CYST EXCISION Left 01/2021   left foot   EYE SURGERY     at age 43   hysterectomy -age 73     ILIAC ARTERY STENT     RIGHT ILIAC STENT   KNEE ARTHROSCOPY     LEFT HEART CATH AND CORONARY ANGIOGRAPHY N/A 03/05/2022   Procedure: LEFT HEART CATH AND CORONARY ANGIOGRAPHY;  Surgeon: Dann Candyce RAMAN, MD;  Location: [facility];  Service: Cardiovascular;  Laterality: N/A;   LITHOTRIPSY Left 12/2020   LUMBAR LAMINECTOMY     TUBAL LIGATION      Family History  Problem Relation Age of Onset   Alzheimer's disease Mother    Hodgkin's lymphoma Brother    Lung cancer Brother    Hepatitis C Brother    Hypothyroidism Brother    Hypertension Brother    Depression Brother    Heart disease Other        Grandfather   Social History   Socioeconomic History   Marital status: Divorced    Spouse name: Not on file   Number of children: 3   Years of education: 10 th   Highest education level: Not on file  Occupational History   Occupation: DISABLED    Employer: UNEMPLOYED  Tobacco Use   Smoking status: Every Day    Current packs/day: 0.00    Average packs/day: 2.0 packs/day for 54.0 years (108.0 ttl pk-yrs)    Types: Cigarettes    Start date: 11/20/1958    Last attempt to quit: 11/20/2012    Years since quitting: 11.3   Smokeless tobacco: Never   Tobacco comments:    2 ppd for many years.     04/13/2022 Patient smokes 1/2 pack daily or more if she is nervous  Substance and Sexual Activity   Alcohol use: No    Alcohol/week: 0.0 standard drinks of alcohol   Drug use: No   Sexual activity: Never  Other Topics Concern   Not on file  Social History Narrative   Patient is single with 3 children, 1 deceased.   Patient is right handed.   Patient has 10 th grade education.   Patient drinks 5 or more cups daily.   Social Drivers of Corporate investment banker Strain: Low Risk  (02/27/2023)   Overall Financial Resource Strain  (CARDIA)    Difficulty of Paying Living Expenses: Not hard at all  Food Insecurity: No Food Insecurity (09/11/2023)   [facility]   Hunger Vital Sign    Within the past 12 months, you worried that your food would run out before you got the money to buy more.: Never true    Within the past 12 months, the food you bought just didn't last and you didn't have money to get more.: Never true  Transportation Needs: No Transportation Needs (09/11/2023)   Received from [facility] - Transportation    Lack of Transportation (Medical): No    Lack of Transportation (Non-Medical): No  Physical Activity: Inactive (09/16/2023)   Exercise Vital Sign    Days of Exercise per Week: 0 days    Minutes of Exercise per Session: 0 min  Stress: No Stress Concern Present (09/11/2023)   Received from [facility] - Occupational Stress Questionnaire    Feeling  of Stress : Not at all  Social Connections: Socially Isolated (09/16/2023)   Social Connection and Isolation Panel    Frequency of Communication with Friends and Family: More than three times a week    Frequency of Social Gatherings with Friends and Family: Once a week    Attends Religious Services: Never    Diplomatic Services operational officer: No    Attends Engineer, structural: Never    Marital Status: Divorced    Objective:  BP 132/74   Pulse 85   Temp 97.8 F (36.6 C)   Ht 5' 5 (1.651 m)   Wt 151 lb (68.5 kg)   SpO2 96%   BMI 25.13 kg/m      04/07/2024    1:48 PM 03/23/2024    9:12 AM 01/06/2024    1:18 PM  BP/Weight  Systolic BP 132 120 108  Diastolic BP 74 60 60  Wt. (Lbs) 151 150.6 156  BMI 25.13 kg/m2 25.06 kg/m2 25.96 kg/m2    Physical Exam Vitals reviewed.  Constitutional:      Appearance: Normal appearance. She is normal weight.  Neck:     Vascular: No carotid bruit.  Cardiovascular:     Rate and Rhythm: Normal rate and regular rhythm.      Heart sounds: Normal heart sounds.  Pulmonary:     Effort: Pulmonary effort is normal. No respiratory distress.     Breath sounds: Normal breath sounds.  Abdominal:     General: Abdomen is flat. Bowel sounds are normal.     Palpations: Abdomen is soft.     Tenderness: There is no abdominal tenderness.  Neurological:     Mental Status: She is alert and oriented to person, place, and time.  Psychiatric:        Mood and Affect: Mood normal.        Behavior: Behavior normal.     {Perform Simple Foot Exam  Perform Detailed exam:1} {Insert foot Exam (Optional):30965}   Lab Results  Component Value Date   WBC 10.4 04/07/2024   HGB 13.0 04/07/2024   HCT 42.9 04/07/2024   PLT 276 04/07/2024   GLUCOSE 175 (H) 04/07/2024   CHOL 106 04/07/2024   TRIG 165 (H) 04/07/2024   HDL 40 04/07/2024   LDLCALC 39 04/07/2024   ALT 17 04/07/2024   AST 27 04/07/2024   NA 144 04/07/2024   K 4.1 04/07/2024   CL 105 04/07/2024   CREATININE 1.19 (H) 04/07/2024   BUN 35 (H) 04/07/2024   CO2 21 04/07/2024   TSH 0.734 04/26/2023   INR 1.0 01/31/2019   HGBA1C 6.7 (H) 04/07/2024   MICROALBUR neg 02/27/2021      Assessment & Plan:  Urinary incontinence, mixed -     Ambulatory referral to Urology  Severe episode of recurrent major depressive disorder, without psychotic features (HCC) -     Ambulatory referral to Psychiatry  Dysuria  Diabetic polyneuropathy associated with type 2 diabetes mellitus (HCC) -     Hemoglobin A1c  Mixed hyperlipidemia -     Lipid panel  Hypertensive heart disease with chronic combined systolic and diastolic congestive heart failure (HCC) -     Comprehensive metabolic panel with GFR -     CBC with Differential/Platelet     No orders of the defined types were placed in this encounter.   Orders Placed This Encounter  Procedures   Hemoglobin A1c   Lipid panel   Comprehensive metabolic panel with GFR  CBC with Differential/Platelet   Ambulatory  referral to Urology   Ambulatory referral to Psychiatry    Assessment and Plan    Morning Vomiting with Phlegm Suspected sinus drainage causing morning vomiting with phlegm. Relief with crackers suggests post-nasal drip. - Encouraged regular use of Fluticasone  nasal spray. - Consider over-the-counter antihistamines if nasal spray is ineffective.  Urinary Incontinence and Bladder Prolapse Persistent urinary incontinence despite pessary and Myrbetriq . Further management needed. - Refer to a urologist for further evaluation and management.  Vaginal Discharge and Odor Brown discharge, itching, and odor present. - Refer to a urologist for evaluation and treatment options.  Diabetes Mellitus Blood glucose levels elevated, possibly due to high fruit intake. Requires dietary modifications. - Discuss dietary modifications focusing on fruit intake.  Depression Depressive symptoms present, affecting overall health. Referral for mental health support planned. - Refer to [facility] for psychiatric evaluation and counseling services.  General Health Maintenance Medications reviewed and confirmed. Lab work needed to monitor health. - Order lab work to monitor health status.  Recording duration: 21 minutes        Follow-up: Return in about 6 weeks (around 05/19/2024) for chronic follow up.   I,Katherina A Bramblett,acting as a scribe for Abigail Free, MD.,have documented all relevant documentation on the behalf of Abigail Free, MD,as directed by  Abigail Free, MD while in the presence of Abigail Free, MD.   An After Visit Summary was printed and given to the patient.  I attest that I have reviewed this visit and agree with the plan scribed by my staff.   Abigail Free, MD [facility] [phone]

## 2024-04-08 ENCOUNTER — Encounter: Payer: Self-pay | Admitting: Family Medicine

## 2024-04-08 ENCOUNTER — Ambulatory Visit: Payer: Self-pay | Admitting: Family Medicine

## 2024-04-08 LAB — CBC WITH DIFFERENTIAL/PLATELET
Basophils Absolute: 0.1 x10E3/uL (ref 0.0–0.2)
Basos: 1 %
EOS (ABSOLUTE): 0.1 x10E3/uL (ref 0.0–0.4)
Eos: 1 %
Hematocrit: 42.9 % (ref 34.0–46.6)
Hemoglobin: 13 g/dL (ref 11.1–15.9)
Immature Grans (Abs): 0.2 x10E3/uL — ABNORMAL HIGH (ref 0.0–0.1)
Immature Granulocytes: 2 %
Lymphocytes Absolute: 2.8 x10E3/uL (ref 0.7–3.1)
Lymphs: 27 %
MCH: 28.4 pg (ref 26.6–33.0)
MCHC: 30.3 g/dL — ABNORMAL LOW (ref 31.5–35.7)
MCV: 94 fL (ref 79–97)
Monocytes Absolute: 0.9 x10E3/uL (ref 0.1–0.9)
Monocytes: 9 %
Neutrophils Absolute: 6.3 x10E3/uL (ref 1.4–7.0)
Neutrophils: 60 %
Platelets: 276 x10E3/uL (ref 150–450)
RBC: 4.57 x10E6/uL (ref 3.77–5.28)
RDW: 13.2 % (ref 11.7–15.4)
WBC: 10.4 x10E3/uL (ref 3.4–10.8)

## 2024-04-08 LAB — COMPREHENSIVE METABOLIC PANEL WITH GFR
ALT: 17 IU/L (ref 0–32)
AST: 27 IU/L (ref 0–40)
Albumin: 4.1 g/dL (ref 3.8–4.8)
Alkaline Phosphatase: 64 IU/L (ref 44–121)
BUN/Creatinine Ratio: 29 — ABNORMAL HIGH (ref 12–28)
BUN: 35 mg/dL — ABNORMAL HIGH (ref 8–27)
Bilirubin Total: 0.3 mg/dL (ref 0.0–1.2)
CO2: 21 mmol/L (ref 20–29)
Calcium: 9.6 mg/dL (ref 8.7–10.3)
Chloride: 105 mmol/L (ref 96–106)
Creatinine, Ser: 1.19 mg/dL — ABNORMAL HIGH (ref 0.57–1.00)
Globulin, Total: 2.4 g/dL (ref 1.5–4.5)
Glucose: 175 mg/dL — ABNORMAL HIGH (ref 70–99)
Potassium: 4.1 mmol/L (ref 3.5–5.2)
Sodium: 144 mmol/L (ref 134–144)
Total Protein: 6.5 g/dL (ref 6.0–8.5)
eGFR: 48 mL/min/1.73 — ABNORMAL LOW (ref >59)

## 2024-04-08 LAB — LIPID PANEL
Chol/HDL Ratio: 2.7 ratio (ref 0.0–4.4)
Cholesterol, Total: 106 mg/dL (ref 100–199)
HDL: 40 mg/dL (ref >39)
LDL Chol Calc (NIH): 39 mg/dL (ref 0–99)
Triglycerides: 165 mg/dL — ABNORMAL HIGH (ref 0–149)
VLDL Cholesterol Cal: 27 mg/dL (ref 5–40)

## 2024-04-08 LAB — HEMOGLOBIN A1C
Est. average glucose Bld gHb Est-mCnc: 146 mg/dL
Hgb A1c MFr Bld: 6.7 % — ABNORMAL HIGH (ref 4.8–5.6)

## 2024-04-12 DIAGNOSIS — N898 Other specified noninflammatory disorders of vagina: Secondary | ICD-10-CM | POA: Insufficient documentation

## 2024-04-12 DIAGNOSIS — F3181 Bipolar II disorder: Secondary | ICD-10-CM | POA: Insufficient documentation

## 2024-04-12 DIAGNOSIS — R1110 Vomiting, unspecified: Secondary | ICD-10-CM | POA: Insufficient documentation

## 2024-04-12 NOTE — Assessment & Plan Note (Deleted)
 Depressive symptoms present, affecting overall health. Referral for mental health support planned. - Refer to [facility] for psychiatric evaluation and counseling services.

## 2024-04-12 NOTE — Assessment & Plan Note (Signed)
 Refer to urology.  ?

## 2024-04-12 NOTE — Assessment & Plan Note (Addendum)
 Well controlled.  No changes to medicines. Continue valsartan  160 mg 1 pill daily, chlorthalidone  25 mg half pill daily, torsemide  20 mg 2 daily, amlodipine  5 mg daily.   Continue to work on eating a healthy diet and exercise.  Labs drawn today.

## 2024-04-12 NOTE — Assessment & Plan Note (Signed)
 Management per specialist.  Continue plavix and crestor, fenofibrate, and zetia.

## 2024-04-12 NOTE — Assessment & Plan Note (Signed)
 Stable since 2016.

## 2024-04-12 NOTE — Assessment & Plan Note (Addendum)
 Depressive symptoms present, affecting overall health. Referral for mental health support planned. - Refer to [facility] for psychiatric evaluation and counseling services.

## 2024-04-12 NOTE — Assessment & Plan Note (Signed)
 Brown discharge, itching, and odor present. - Refer to a urologist for evaluation and treatment options.

## 2024-04-12 NOTE — Assessment & Plan Note (Addendum)
 Suspected sinus drainage causing morning vomiting with phlegm. Relief with crackers suggests post-nasal drip. - Encouraged regular use of Fluticasone  nasal spray. - Consider over-the-counter antihistamines if fluticasone  is ineffective.

## 2024-04-12 NOTE — Assessment & Plan Note (Addendum)
 Blood glucose levels elevated, possibly due to high fruit intake. Requires dietary modifications. - Discuss dietary modifications focusing on fruit intake. - Continue Ozempic  2 mg weekly.  - Continue to check sugars daily.  - continue to check feet.

## 2024-04-12 NOTE — Assessment & Plan Note (Signed)
 Stable.  Continue trelegy one inhalation daily.  Recommend quit smoking.

## 2024-04-12 NOTE — Assessment & Plan Note (Signed)
 Persistent urinary incontinence despite pessary and Myrbetriq . Further management needed. - Refer to a urologist for further evaluation and management.

## 2024-04-12 NOTE — Assessment & Plan Note (Signed)
 Well controlled.  No changes to medicines. Continue fenofibrate  160 mg daily, rosuvastatin  20 mg daily. Vascepa  1 gm 2 capsules twice daily, and zetia  10 mg daily.   Continue to work on eating a healthy diet and exercise.  Labs drawn today

## 2024-04-19 ENCOUNTER — Other Ambulatory Visit: Payer: Self-pay | Admitting: Family Medicine

## 2024-04-19 DIAGNOSIS — I110 Hypertensive heart disease with heart failure: Secondary | ICD-10-CM

## 2024-04-27 ENCOUNTER — Other Ambulatory Visit: Payer: Self-pay | Admitting: Family Medicine

## 2024-05-14 ENCOUNTER — Other Ambulatory Visit: Payer: Self-pay | Admitting: Family Medicine

## 2024-05-27 ENCOUNTER — Ambulatory Visit: Admitting: Family Medicine

## 2024-05-27 VITALS — BP 130/72 | HR 86 | Temp 98.2°F | Ht 65.0 in | Wt 152.0 lb

## 2024-05-27 DIAGNOSIS — R5383 Other fatigue: Secondary | ICD-10-CM

## 2024-05-27 DIAGNOSIS — F332 Major depressive disorder, recurrent severe without psychotic features: Secondary | ICD-10-CM | POA: Diagnosis not present

## 2024-05-27 DIAGNOSIS — H8111 Benign paroxysmal vertigo, right ear: Secondary | ICD-10-CM

## 2024-05-27 DIAGNOSIS — I110 Hypertensive heart disease with heart failure: Secondary | ICD-10-CM

## 2024-05-27 DIAGNOSIS — I5042 Chronic combined systolic (congestive) and diastolic (congestive) heart failure: Secondary | ICD-10-CM

## 2024-05-27 DIAGNOSIS — R0981 Nasal congestion: Secondary | ICD-10-CM

## 2024-05-27 MED ORDER — MECLIZINE HCL 25 MG PO TABS: 25.0000 mg | ORAL_TABLET | Freq: Three times a day (TID) | ORAL | 0 refills | Status: AC | PRN

## 2024-05-27 MED ORDER — ALPRAZOLAM 0.5 MG PO TABS: 0.5000 mg | ORAL_TABLET | Freq: Three times a day (TID) | ORAL | 3 refills | Status: DC

## 2024-05-27 NOTE — Patient Instructions (Addendum)
  VISIT SUMMARY: During your visit, we discussed your dizziness and vertigo. We have made some adjustments to your treatment plan to help manage these issues.  YOUR PLAN: DIZZINESS AND VERTIGO: You have been experiencing significant dizziness, especially in the mornings, which has worsened recently. This is likely due to benign paroxysmal positional vertigo (BPPV). -You are referred to physical therapy at [facility] with [facility] for BPPV exercises. -You are prescribed Meclizine  for dizziness, to be taken as needed up to three times daily. -You are provided with BPPV exercises to practice at home.  If [facility] does not call you back,  call [facility]: Phone: [phone]  [facility]: for dizziness therapy Address: [address], [place], [place] [address] Phone: [phone]                     Contains text generated by [employer]bridge.                                 Contains text generated by Abridge.

## 2024-05-27 NOTE — Progress Notes (Signed)
 Subjective:  Patient ID: Sonya Small, female    DOB: 21-Aug-1951  Age: 73 y.o. MRN: [id number]  Chief Complaint  Patient presents with   Medical Management of Chronic Issues    HPI: Discussed the use of AI scribe software for clinical note transcription with the patient, who gave verbal consent to proceed.  History of Present Illness Sonya Small is a 73 year old female who presents with dizziness and vertigo.  Dizziness and vertigo - Significant dizziness, particularly in the mornings upon getting up - Requires standing still for a few minutes before walking to the bathroom - Dizziness has been ongoing for quite a while but has worsened recently - Must sleep on left side to avoid falling out of bed, as she tends to fall out when sleeping on her right side due to being closer to the edge than she realizes - No current use of Meclizine  for dizziness but open to using it as needed - Expresses fear of her medications due to dizziness  Gastrointestinal symptoms - Morning vomiting described as 'yellow, icky stuff' - more like drainage and sputum. - No chest pain or breathing problems beyond her baseline  Nasal congestion - Nasal congestion upon waking, particularly after vomiting - Uses a nasal spray  to manage symptoms  Medication use and adverse effects - Currently taking Valsartan , half of a 160 mg tablet every night - Previously on Myrbetriq , which was discontinued by urology who put her on vesicare  5 mg daily and provided a hormone cream, which she feels is not effective        04/07/2024    1:57 PM 01/06/2024    2:07 PM 09/16/2023    3:17 PM 09/09/2023    9:51 AM 06/10/2023   11:44 AM  Depression screen PHQ 2/9  Decreased Interest 3 3 0 0   Down, Depressed, Hopeless 3 3 0 0   PHQ - 2 Score 6 6 0 0   Altered sleeping 3 3 0 0 2  Tired, decreased energy 3 3 0 0 3  Change in appetite 2 2 0 0 0  Feeling bad or failure about yourself  0 3 0 0 3  Trouble concentrating 1 3  0 0 3  Moving slowly or fidgety/restless 0 0 0 0 0  Suicidal thoughts 0 0 0 0 0  PHQ-9 Score 15 20 0 0   Difficult doing work/chores Somewhat difficult Very difficult Not difficult at all Not difficult at all Somewhat difficult        04/07/2024    1:57 PM  Fall Risk   Falls in the past year? 1  Number falls in past yr: 1  Injury with Fall? 0  Risk for fall due to : Impaired balance/gait  Follow up Falls evaluation completed    Patient Care Team: Sherre Clapper, MD as PCP - General (Family Medicine) [name], [name], MD as PCP - Cardiology (Cardiology) Mardee Franc, MD as Referring Physician (Specialist) Estelle Derry SAUNDERS, MD as Referring Physician (Obstetrics and Gynecology)   Review of Systems  Constitutional:  Positive for fatigue. Negative for chills and fever.  HENT:  Negative for congestion, ear pain and sore throat.   Respiratory:  Negative for cough and shortness of breath.   Cardiovascular:  Negative for chest pain.  Gastrointestinal:  Negative for abdominal pain, constipation, diarrhea, nausea and vomiting.  Genitourinary:  Negative for dysuria and urgency.  Musculoskeletal:  Negative for arthralgias and myalgias.  Skin:  Negative for  rash.  Neurological:  Positive for dizziness. Negative for headaches.  Psychiatric/Behavioral:  Positive for dysphoric mood. The patient is not nervous/anxious.     Current Outpatient Medications on File Prior to Visit  Medication Sig Dispense Refill   solifenacin  (VESICARE ) 5 MG tablet Take 5 mg by mouth daily.     valsartan  (DIOVAN ) 160 MG tablet TAKE 1 TABLET BY MOUTH ONCE DAILY (Patient taking differently: Take 80 mg by mouth daily.) 30 tablet 10   albuterol  (PROVENTIL ) (2.5 MG/3ML) 0.083% nebulizer solution Take 3 mLs (2.5 mg total) by nebulization every 6 (six) hours as needed for wheezing or shortness of breath. 75 mL 2   albuterol  (VENTOLIN  HFA) 108 (90 Base) MCG/ACT inhaler Inhale 2 puffs into the lungs every 4 (four) hours as  needed for wheezing or shortness of breath.     amLODipine  (NORVASC ) 5 MG tablet TAKE 1 TABLET BY MOUTH DAILY 30 tablet 11   Aspirin -Acetaminophen  (GOODYS BODY PAIN PO) Take 1 packet by mouth daily.     baclofen (LIORESAL) 10 MG tablet Take 10 mg by mouth 3 (three) times daily as needed.     blood glucose meter kit and supplies KIT Dispense based on patient and insurance preference. Use up to four times daily as directed. 1 each 0   buPROPion  (WELLBUTRIN  XL) 150 MG 24 hr tablet TAKE 1 TABLET BY MOUTH ONCE DAILY 30 tablet 10   chlorthalidone  (HYGROTON ) 25 MG tablet TAKE 1/2 TABLET BY MOUTH EVERY DAY 45 tablet 3   clopidogrel  (PLAVIX ) 75 MG tablet TAKE 1 TABLET BY MOUTH EVERY MORNING 30 tablet 10   ezetimibe  (ZETIA ) 10 MG tablet TAKE 1 TABLET BY MOUTH ONCE DAILY 30 tablet 10   fenofibrate  160 MG tablet TAKE 1 TABLET BY MOUTH EVERY MORNING 30 tablet 10   glucose blood (ONETOUCH ULTRA) test strip DISPENSE BASED ON PATIENT AND INSURANCE PREFERENCE. USE UP TO FOUR TIMES DAILY AS DIRECTED. 400 strip 3   lamoTRIgine  (LAMICTAL ) 25 MG tablet TAKE 2 TABLETS BY MOUTH EVERY DAY AT BEDTIME 60 tablet 10   lansoprazole  (PREVACID ) 30 MG capsule TAKE ONE (1) CAPSULE BY MOUTH ONCE DAILY BEFORE BREAKFAST 30 capsule 11   naloxone (NARCAN) nasal spray 4 mg/0.1 mL      nitroGLYCERIN  (NITROSTAT ) 0.4 MG SL tablet Dissolve 1 tab under tongue as needed for chest pain. May repeat every 5 minutes x 2 doses. If no relief call 9-1-1. 25 tablet 2   oxyCODONE -acetaminophen  (PERCOCET) 10-325 MG tablet Take 0.5 tablets by mouth in the morning and at bedtime.     rosuvastatin  (CRESTOR ) 20 MG tablet TAKE 1 TABLET BY MOUTH EVERY DAY AT BEDTIME 30 tablet 10   Semaglutide , 2 MG/DOSE, (OZEMPIC , 2 MG/DOSE,) 8 MG/3ML SOPN Inject 2 mg into the skin once a week. 9 mL 1   torsemide  (DEMADEX ) 20 MG tablet TAKE 1 TABLET BY MOUTH DAILY 30 tablet 11   TRELEGY ELLIPTA 200-62.5-25 MCG/ACT AEPB Inhale 1 puff into the lungs daily.     TRUEplus  Lancets 30G MISC 1 each by Does not apply route 2 (two) times daily. E11.69 400 each 3   valACYclovir  (VALTREX ) 1000 MG tablet TAKE 2 TABLETS BY MOUTH TWICE DAILY AS NEEDED 20 tablet 2   VASCEPA  1 g capsule TAKE 2 CAPSULES BY MOUTH TWICE DAILY 120 capsule 10   No current facility-administered medications on file prior to visit.   Past Medical History:  Diagnosis Date   Anxiety    CAD (coronary artery disease)  a. NSTEMI 11/2008 s/p DES to LCx (3.0x12 Xience); b. NSTEMI 01/2010 secondary to thrombotic RCA lesion (non-obstructive)-->med rx (integrilin x 24 hrs + plavix ); c. 09/2012 negative Myoview .   Chronic diastolic CHF (congestive heart failure) (HCC)    a. 06/2014 Echo: EF 55-60%, no rwma, Gr1 DD, mild AI.   Depression    Dizziness    Drug induced constipation    Ganglion cyst of left foot 01/17/2021   Generalized hyperhidrosis    GERD (gastroesophageal reflux disease)    Headache    Hyperlipidemia    Hypertensive heart disease    Lumbar disc disease    Metabolic encephalopathy    Mixed hyperlipidemia    Myocardial infarction (HCC)    Obstructive sleep apnea    OP (osteoporosis)    Osteoarthritis    Osteoporosis    Other malaise    Overweight(278.02)    PAD (peripheral artery disease) (HCC)    a. Emboli to R foot 2010 from partially occlusive lesion in R EIA, s/p stenting. - followed by Dr. Oris;  b. 10/2015 ABIs: R 1.03, L 0.97.   Restless leg    Sleep apnea    Stroke [facility])    TIA (transient ischemic attack)    Tobacco abuse    Urge incontinence    Past Surgical History:  Procedure Laterality Date   CYST EXCISION Left 01/2021   left foot   EYE SURGERY     at age 64   hysterectomy -age 21     ILIAC ARTERY STENT     RIGHT ILIAC STENT   KNEE ARTHROSCOPY     LEFT HEART CATH AND CORONARY ANGIOGRAPHY N/A 03/05/2022   Procedure: LEFT HEART CATH AND CORONARY ANGIOGRAPHY;  Surgeon: Dann Candyce RAMAN, MD;  Location: [facility] IN[facility];  Service: Cardiovascular;   Laterality: N/A;   LITHOTRIPSY Left 12/2020   LUMBAR LAMINECTOMY     TUBAL LIGATION      Family History  Problem Relation Age of Onset   Alzheimer's disease Mother    Hodgkin's lymphoma Brother    Lung cancer Brother    Hepatitis C Brother    Hypothyroidism Brother    Hypertension Brother    Depression Brother    Heart disease Other        Grandfather   Social History   Socioeconomic History   Marital status: Divorced    Spouse name: Not on file   Number of children: 3   Years of education: 10 th   Highest education level: Not on file  Occupational History   Occupation: DISABLED    Employer: UNEMPLOYED  Tobacco Use   Smoking status: Every Day    Current packs/day: 0.00    Average packs/day: 2.0 packs/day for 54.0 years (108.0 ttl pk-yrs)    Types: Cigarettes    Start date: 11/20/1958    Last attempt to quit: 11/20/2012    Years since quitting: 11.5   Smokeless tobacco: Never   Tobacco comments:    2 ppd for many years.     04/13/2022 Patient smokes 1/2 pack daily or more if she is nervous  Substance and Sexual Activity   Alcohol use: No    Alcohol/week: 0.0 standard drinks of alcohol   Drug use: No   Sexual activity: Never  Other Topics Concern   Not on file  Social History Narrative   Patient is single with 3 children, 1 deceased.   Patient is right handed.   Patient has 10 th grade education.  Patient drinks 5 or more cups daily.   Social Drivers of Corporate investment banker Strain: Low Risk  (02/27/2023)   Overall Financial Resource Strain (CARDIA)    Difficulty of Paying Living Expenses: Not hard at all  Food Insecurity: No Food Insecurity (05/27/2024)   Hunger Vital Sign    Worried About Running Out of Food in the Last Year: Never true    Ran Out of Food in the Last Year: Never true  Transportation Needs: No Transportation Needs (05/27/2024)   PRAPARE - Administrator, Civil Service (Medical): No    Lack of Transportation (Non-Medical): No   Physical Activity: Inactive (09/16/2023)   Exercise Vital Sign    Days of Exercise per Week: 0 days    Minutes of Exercise per Session: 0 min  Stress: No Stress Concern Present (09/11/2023)   Received from [facility] - Occupational Stress Questionnaire    Feeling of Stress : Not at all  Social Connections: Socially Isolated (09/16/2023)   Social Connection and Isolation Panel    Frequency of Communication with Friends and Family: More than three times a week    Frequency of Social Gatherings with Friends and Family: Once a week    Attends Religious Services: Never    Database administrator or Organizations: No    Attends Engineer, structural: Never    Marital Status: Divorced    Objective:  BP 130/72   Pulse 86   Temp 98.2 F (36.8 C)   Ht 5' 5 (1.651 m)   Wt 152 lb (68.9 kg)   SpO2 98%   BMI 25.29 kg/m      05/27/2024    2:08 PM 04/07/2024    1:48 PM 03/23/2024    9:12 AM  BP/Weight  Systolic BP 130 132 120  Diastolic BP 72 74 60  Wt. (Lbs) 152 151 150.6  BMI 25.29 kg/m2 25.13 kg/m2 25.06 kg/m2    Physical Exam Vitals reviewed.  Constitutional:      Appearance: Normal appearance. She is normal weight.  Eyes:     Extraocular Movements:     Right eye: No nystagmus.     Left eye: No nystagmus.  Cardiovascular:     Rate and Rhythm: Normal rate and regular rhythm.     Heart sounds: Normal heart sounds.  Pulmonary:     Effort: Pulmonary effort is normal. No respiratory distress.     Breath sounds: Normal breath sounds.  Abdominal:     General: Abdomen is flat.     Palpations: Abdomen is soft.     Tenderness: There is no abdominal tenderness.  Neurological:     Mental Status: She is alert and oriented to person, place, and time.     Comments: Positive right sided epley maneuver.  Negative left sided epley maneuver.   Psychiatric:        Mood and Affect: Mood normal.        Behavior: Behavior normal.          Lab Results  Component Value Date   WBC 10.4 04/07/2024   HGB 13.0 04/07/2024   HCT 42.9 04/07/2024   PLT 276 04/07/2024   GLUCOSE 175 (H) 04/07/2024   CHOL 106 04/07/2024   TRIG 165 (H) 04/07/2024   HDL 40 04/07/2024   LDLCALC 39 04/07/2024   ALT 17 04/07/2024   AST 27 04/07/2024   NA 144 04/07/2024   K 4.1  04/07/2024   CL 105 04/07/2024   CREATININE 1.19 (H) 04/07/2024   BUN 35 (H) 04/07/2024   CO2 21 04/07/2024   TSH 0.734 04/26/2023   INR 1.0 01/31/2019   HGBA1C 6.7 (H) 04/07/2024      Assessment & Plan:  Benign paroxysmal positional vertigo of right ear Assessment & Plan: Dizziness occurs with positional changes due to BPPV.  BPPV affects quality of life but is not life-threatening. - Refer to physical therapy at [facility] with MiniSingh for BPPV exercises. - Prescribe meclizine  for dizziness, as needed up to three times daily. - Provide BPPV exercises for home practice.  Orders: -     Ambulatory referral to Physical Therapy -     Meclizine  HCl; Take 1 tablet (25 mg total) by mouth 3 (three) times daily as needed for dizziness.  Dispense: 30 tablet; Refill: 0  Other fatigue Assessment & Plan: Multifactorial.   Hypertensive heart disease with chronic combined systolic and diastolic congestive heart failure (HCC) Assessment & Plan: Blood pressure well-controlled on valsartan  160 mg 1/2 daily without hypotension or adverse effects. - Continue valsartan  80 mg daily.   Severe episode of recurrent major depressive disorder, without psychotic features (HCC) Assessment & Plan: Mood affected by dizziness and ongoing issues. Reports feeling 'all right' We attempted to call [place] counseling while she was here and left a message as she has been unable to reach them for counseling.  [facility] counseling as an alternative.   Orders: -     ALPRAZolam ; Take 1 tablet (0.5 mg total) by mouth 3 (three) times daily.  Dispense: 90 tablet; Refill:  3  Chronic nasal congestion Assessment & Plan: Chronic nasal congestion and phlegm, especially in the mornings. Nasal spray from [facility] provides some relief.     Total time spent on today's visit was 30 minutes, including both face-to-face time and nonface-to-face time personally spent on review of chart (labs and imaging), discussing labs and goals, discussing further work-up, treatment options, referrals to specialist if needed, reviewing outside records of pertinent, answering patient's questions, and coordinating care.   Meds ordered this encounter  Medications   ALPRAZolam  (XANAX ) 0.5 MG tablet    Sig: Take 1 tablet (0.5 mg total) by mouth 3 (three) times daily.    Dispense:  90 tablet    Refill:  3   meclizine  (ANTIVERT ) 25 MG tablet    Sig: Take 1 tablet (25 mg total) by mouth 3 (three) times daily as needed for dizziness.    Dispense:  30 tablet    Refill:  0    Orders Placed This Encounter  Procedures   Ambulatory referral to Physical Therapy     Follow-up: No follow-ups on file.   I,Danish Ruffins,acting as a Neurosurgeon for Abigail Free, MD.,have documented all relevant documentation on the behalf of Abigail Free, MD,as directed by  Abigail Free, MD while in the presence of Abigail Free, MD.   LILLETTE Kato I Leal-Borjas,acting as a scribe for Abigail Free, MD.,have documented all relevant documentation on the behalf of Abigail Free, MD,as directed by  Abigail Free, MD while in the presence of Abigail Free, MD.    An After Visit Summary was printed and given to the patient.  Abigail Free, MD [facility] [phone]

## 2024-05-31 ENCOUNTER — Encounter: Payer: Self-pay | Admitting: Family Medicine

## 2024-05-31 DIAGNOSIS — H8111 Benign paroxysmal vertigo, right ear: Secondary | ICD-10-CM | POA: Insufficient documentation

## 2024-05-31 DIAGNOSIS — R0981 Nasal congestion: Secondary | ICD-10-CM | POA: Insufficient documentation

## 2024-05-31 NOTE — Assessment & Plan Note (Addendum)
 Mood affected by dizziness and ongoing issues. Reports feeling 'all right' We attempted to call [place] counseling while she was here and left a message as she has been unable to reach them for counseling.  [facility] counseling as an alternative.

## 2024-05-31 NOTE — Assessment & Plan Note (Addendum)
 Blood pressure well-controlled on valsartan  160 mg 1/2 daily without hypotension or adverse effects. - Continue valsartan  80 mg daily.

## 2024-05-31 NOTE — Assessment & Plan Note (Addendum)
 Dizziness occurs with positional changes due to BPPV.  BPPV affects quality of life but is not life-threatening. - Refer to physical therapy at [facility] with [facility]h for BPPV exercises. - Prescribe meclizine  for dizziness, as needed up to three times daily. - Provide BPPV exercises for home practice.

## 2024-05-31 NOTE — Assessment & Plan Note (Signed)
 Chronic nasal congestion and phlegm, especially in the mornings. Nasal spray from [facility] provides some relief.

## 2024-05-31 NOTE — Assessment & Plan Note (Addendum)
 Multifactorial

## 2024-06-03 ENCOUNTER — Telehealth: Payer: Self-pay | Admitting: Family Medicine

## 2024-06-03 NOTE — Telephone Encounter (Signed)
 [facility] Prescription Renewal

## 2024-06-11 ENCOUNTER — Other Ambulatory Visit: Payer: Self-pay

## 2024-06-11 MED ORDER — VALSARTAN 80 MG PO TABS: 80.0000 mg | ORAL_TABLET | Freq: Every day | ORAL | 1 refills | Status: DC

## 2024-06-15 ENCOUNTER — Other Ambulatory Visit: Payer: Self-pay

## 2024-06-15 ENCOUNTER — Other Ambulatory Visit: Payer: Self-pay | Admitting: Family Medicine

## 2024-06-15 ENCOUNTER — Telehealth: Payer: Self-pay

## 2024-06-15 DIAGNOSIS — E1140 Type 2 diabetes mellitus with diabetic neuropathy, unspecified: Secondary | ICD-10-CM

## 2024-06-15 MED ORDER — VALSARTAN 80 MG PO TABS: 80.0000 mg | ORAL_TABLET | Freq: Every day | ORAL | 1 refills | Status: DC

## 2024-06-15 NOTE — Telephone Encounter (Signed)
 Patient came in today requesting a rx for chux's to use at night 34x36. She uses one a night due to urinary incontinence. She wants this order sent to [facility].

## 2024-06-18 NOTE — Telephone Encounter (Signed)
 Done

## 2024-06-23 ENCOUNTER — Telehealth: Payer: Self-pay | Admitting: Family Medicine

## 2024-06-23 NOTE — Telephone Encounter (Signed)
 [facility] [phone]

## 2024-06-30 ENCOUNTER — Encounter: Payer: Self-pay | Admitting: Family Medicine

## 2024-06-30 ENCOUNTER — Telehealth: Payer: Self-pay | Admitting: Family Medicine

## 2024-06-30 ENCOUNTER — Ambulatory Visit: Admitting: Family Medicine

## 2024-06-30 VITALS — BP 112/64 | HR 81 | Temp 98.0°F | Resp 18 | Ht 65.0 in | Wt 152.8 lb

## 2024-06-30 DIAGNOSIS — J449 Chronic obstructive pulmonary disease, unspecified: Secondary | ICD-10-CM

## 2024-06-30 DIAGNOSIS — G8929 Other chronic pain: Secondary | ICD-10-CM

## 2024-06-30 DIAGNOSIS — Z23 Encounter for immunization: Secondary | ICD-10-CM

## 2024-06-30 DIAGNOSIS — E1140 Type 2 diabetes mellitus with diabetic neuropathy, unspecified: Secondary | ICD-10-CM

## 2024-06-30 DIAGNOSIS — H8111 Benign paroxysmal vertigo, right ear: Secondary | ICD-10-CM | POA: Diagnosis not present

## 2024-06-30 DIAGNOSIS — N3946 Mixed incontinence: Secondary | ICD-10-CM

## 2024-06-30 DIAGNOSIS — I110 Hypertensive heart disease with heart failure: Secondary | ICD-10-CM | POA: Diagnosis not present

## 2024-06-30 DIAGNOSIS — E782 Mixed hyperlipidemia: Secondary | ICD-10-CM

## 2024-06-30 DIAGNOSIS — K219 Gastro-esophageal reflux disease without esophagitis: Secondary | ICD-10-CM

## 2024-06-30 DIAGNOSIS — L600 Ingrowing nail: Secondary | ICD-10-CM | POA: Diagnosis not present

## 2024-06-30 DIAGNOSIS — I5042 Chronic combined systolic (congestive) and diastolic (congestive) heart failure: Secondary | ICD-10-CM

## 2024-06-30 DIAGNOSIS — R519 Headache, unspecified: Secondary | ICD-10-CM

## 2024-06-30 DIAGNOSIS — I2510 Atherosclerotic heart disease of native coronary artery without angina pectoris: Secondary | ICD-10-CM

## 2024-06-30 DIAGNOSIS — F332 Major depressive disorder, recurrent severe without psychotic features: Secondary | ICD-10-CM

## 2024-06-30 MED ORDER — CEPHALEXIN 500 MG PO CAPS: 500.0000 mg | ORAL_CAPSULE | Freq: Two times a day (BID) | ORAL | 0 refills | Status: DC

## 2024-06-30 NOTE — Telephone Encounter (Signed)
 [facility] Initial Evaluation Plan of Care

## 2024-06-30 NOTE — Progress Notes (Unsigned)
 Subjective:  Patient ID: Sonya Small, female    DOB: 03/25/1951  Age: 73 y.o. MRN: [id number]  Chief Complaint  Patient presents with   Medical Management of Chronic Issues    HPI: Discussed the use of AI scribe software for clinical note transcription with the patient, who gave verbal consent to proceed.  History of Present Illness Sonya Small is a 73 year old female with benign paroxysmal positional vertigo who presents with persistent dizziness.  Vertigo and dizziness - Persistent dizziness despite initiation of new exercises and medications from physical therapy and prescription of meclizine  - She has only been to therapy a couple of times - Exercises are difficult to perform - Meclizine  has not alleviated symptoms - Significant dizziness, especially in the mornings - Dizziness interferes with daily activities  Headache - Persistent daily headaches - No recent changes in headache pattern  Urinary incontinence - Urinary incontinence, particularly when turning over or getting out of bed - Currently taking Myrbetriq  50 mg and VESIcare  5 mg each daily - Under care of a urologist - History of a past procedure by Dr. Estelle - Uses a cream for management  Infected ingrown toenail - Infected ingrown toenail on right foot for approximately two weeks - Using triple antibiotic ointment for local care - Plans to contact podiatrist Dr. Alphonza for further management  Hypertension/CAD - Blood pressure ranges from 130/60 mmHg to 155/70 mmHg - Heart rate in the 70s to 80s - Takes amlodipine  5 mg daily, valsartan  80 mg daily, chlorthalidone  25 mg 1/2 pill daily and torsemide  20 mg daily for blood pressure management - on plavix  75 mg daily  - Angina - Nitroglycerin  prescribed but not used  Diabetes mellitus - Blood sugar levels range from 128 mg/dL to 795 mg/dL, with occasional higher readings before meals - Takes Ozempic  2 mg weekly for glycemic control  Hyperlipidemia -  Takes zetia  10 mg daily, fenofibrate  160 mg daily, vascepa  1 gm 2 capsule twice daily, and rosuvastatin  20 mg daily for cholesterol management  Depression and anxiety - History of depression - Currently taking Wellbutrin  XL 150 mg and Lamictal  25 mg two daily - Takes Xanax  as needed for anxiety  Chronic obstructive pulmonary disease (copd) - Takes Trelegy one inhalation daily and albuterol  for COPD management  Gastroesophageal reflux disease (gerd) - Takes Prevacid  30 mg daily  for acid reflux  Muscle spasms - Takes baclofen for muscle relaxation         04/07/2024    1:57 PM 01/06/2024    2:07 PM 09/16/2023    3:17 PM 09/09/2023    9:51 AM 06/10/2023   11:44 AM  Depression screen PHQ 2/9  Decreased Interest 3 3 0 0   Down, Depressed, Hopeless 3 3 0 0   PHQ - 2 Score 6 6 0 0   Altered sleeping 3 3 0 0 2  Tired, decreased energy 3 3 0 0 3  Change in appetite 2 2 0 0 0  Feeling bad or failure about yourself  0 3 0 0 3  Trouble concentrating 1 3 0 0 3  Moving slowly or fidgety/restless 0 0 0 0 0  Suicidal thoughts 0 0 0 0 0  PHQ-9 Score 15 20 0 0   Difficult doing work/chores Somewhat difficult Very difficult Not difficult at all Not difficult at all Somewhat difficult        04/07/2024    1:57 PM  Fall Risk   Falls in the  past year? 1  Number falls in past yr: 1  Injury with Fall? 0  Risk for fall due to : Impaired balance/gait  Follow up Falls evaluation completed    Patient Care Team: Sherre Clapper, MD as PCP - General (Family Medicine) [name], [name], MD as PCP - Cardiology (Cardiology) Mardee Franc, MD as Referring Physician (Specialist) Estelle Derry SAUNDERS, MD as Referring Physician (Obstetrics and Gynecology)   Review of Systems  All other systems reviewed and are negative.   Current Outpatient Medications on File Prior to Visit  Medication Sig Dispense Refill   albuterol  (PROVENTIL ) (2.5 MG/3ML) 0.083% nebulizer solution Take 3 mLs (2.5 mg total) by  nebulization every 6 (six) hours as needed for wheezing or shortness of breath. 75 mL 2   albuterol  (VENTOLIN  HFA) 108 (90 Base) MCG/ACT inhaler Inhale 2 puffs into the lungs every 4 (four) hours as needed for wheezing or shortness of breath.     ALPRAZolam  (XANAX ) 0.5 MG tablet Take 1 tablet (0.5 mg total) by mouth 3 (three) times daily. 90 tablet 3   amLODipine  (NORVASC ) 5 MG tablet TAKE 1 TABLET BY MOUTH DAILY 30 tablet 11   Aspirin -Acetaminophen  (GOODYS BODY PAIN PO) Take 1 packet by mouth daily.     baclofen (LIORESAL) 10 MG tablet Take 10 mg by mouth 3 (three) times daily as needed.     blood glucose meter kit and supplies KIT Dispense based on patient and insurance preference. Use up to four times daily as directed. 1 each 0   buPROPion  (WELLBUTRIN  XL) 150 MG 24 hr tablet TAKE 1 TABLET BY MOUTH ONCE DAILY 30 tablet 10   chlorthalidone  (HYGROTON ) 25 MG tablet TAKE 1/2 TABLET BY MOUTH EVERY DAY 45 tablet 3   clopidogrel  (PLAVIX ) 75 MG tablet TAKE 1 TABLET BY MOUTH EVERY MORNING 30 tablet 11   ezetimibe  (ZETIA ) 10 MG tablet TAKE 1 TABLET BY MOUTH ONCE DAILY 30 tablet 11   fenofibrate  160 MG tablet TAKE 1 TABLET BY MOUTH EVERY MORNING 30 tablet 11   glucose blood (ONETOUCH ULTRA) test strip DISPENSE BASED ON PATIENT AND INSURANCE PREFERENCE. USE UP TO FOUR TIMES DAILY AS DIRECTED. 400 strip 3   lamoTRIgine  (LAMICTAL ) 25 MG tablet TAKE 2 TABLETS BY MOUTH AT BEDTIME 60 tablet 11   lansoprazole  (PREVACID ) 30 MG capsule TAKE ONE (1) CAPSULE BY MOUTH ONCE DAILY BEFORE BREAKFAST 30 capsule 11   meclizine  (ANTIVERT ) 25 MG tablet Take 1 tablet (25 mg total) by mouth 3 (three) times daily as needed for dizziness. 30 tablet 0   MYRBETRIQ  50 MG TB24 tablet Take 50 mg by mouth daily.     naloxone (NARCAN) nasal spray 4 mg/0.1 mL      nitroGLYCERIN  (NITROSTAT ) 0.4 MG SL tablet Dissolve 1 tab under tongue as needed for chest pain. May repeat every 5 minutes x 2 doses. If no relief call 9-1-1. 25 tablet 2    oxyCODONE -acetaminophen  (PERCOCET) 10-325 MG tablet Take 0.5 tablets by mouth in the morning and at bedtime.     OZEMPIC , 2 MG/DOSE, 8 MG/3ML SOPN INJECT 2MG  SUBCUTANEOUSLY ONCE WEEKLY 9 mL 11   rosuvastatin  (CRESTOR ) 20 MG tablet TAKE 1 TABLET BY MOUTH AT BEDTIME 30 tablet 11   solifenacin  (VESICARE ) 5 MG tablet Take 5 mg by mouth daily.     torsemide  (DEMADEX ) 20 MG tablet TAKE 1 TABLET BY MOUTH DAILY 30 tablet 11   TRELEGY ELLIPTA 200-62.5-25 MCG/ACT AEPB Inhale 1 puff into the lungs daily.  TRUEplus Lancets 30G MISC 1 each by Does not apply route 2 (two) times daily. E11.69 400 each 3   valACYclovir  (VALTREX ) 1000 MG tablet TAKE 2 TABLETS BY MOUTH TWICE DAILY AS NEEDED 20 tablet 2   valsartan  (DIOVAN ) 80 MG tablet Take 1 tablet (80 mg total) by mouth daily. 90 tablet 1   VASCEPA  1 g capsule TAKE 2 CAPSULES BY MOUTH TWICE DAILY 120 capsule 10   No current facility-administered medications on file prior to visit.   Past Medical History:  Diagnosis Date   Anxiety    CAD (coronary artery disease)    a. NSTEMI 11/2008 s/p DES to LCx (3.0x12 Xience); b. NSTEMI 01/2010 secondary to thrombotic RCA lesion (non-obstructive)-->med rx (integrilin x 24 hrs + plavix ); c. 09/2012 negative Myoview .   Chronic diastolic CHF (congestive heart failure) (HCC)    a. 06/2014 Echo: EF 55-60%, no rwma, Gr1 DD, mild AI.   Depression    Dizziness    Drug induced constipation    Ganglion cyst of left foot 01/17/2021   Generalized hyperhidrosis    GERD (gastroesophageal reflux disease)    Headache    Hyperlipidemia    Hypertensive heart disease    Lumbar disc disease    Metabolic encephalopathy    Mixed hyperlipidemia    Myocardial infarction (HCC)    Obstructive sleep apnea    OP (osteoporosis)    Osteoarthritis    Osteoporosis    Other malaise    Overweight(278.02)    PAD (peripheral artery disease)    a. Emboli to R foot 2010 from partially occlusive lesion in R EIA, s/p stenting. - followed by Dr.  Oris;  b. 10/2015 ABIs: R 1.03, L 0.97.   Restless leg    Sleep apnea    Stroke [facility])    TIA (transient ischemic attack)    Tobacco abuse    Urge incontinence    Past Surgical History:  Procedure Laterality Date   CYST EXCISION Left 01/2021   left foot   EYE SURGERY     at age 20   hysterectomy -age 13     ILIAC ARTERY STENT     RIGHT ILIAC STENT   KNEE ARTHROSCOPY     LEFT HEART CATH AND CORONARY ANGIOGRAPHY N/A 03/05/2022   Procedure: LEFT HEART CATH AND CORONARY ANGIOGRAPHY;  Surgeon: Dann Candyce RAMAN, MD;  Location: [facility] INVAS[facility];  Service: Cardiovascular;  Laterality: N/A;   LITHOTRIPSY Left 12/2020   LUMBAR LAMINECTOMY     TUBAL LIGATION      Family History  Problem Relation Age of Onset   Alzheimer's disease Mother    Hodgkin's lymphoma Brother    Lung cancer Brother    Hepatitis C Brother    Hypothyroidism Brother    Hypertension Brother    Depression Brother    Heart disease Other        Grandfather   Social History   Socioeconomic History   Marital status: Divorced    Spouse name: Not on file   Number of children: 3   Years of education: 10 th   Highest education level: Not on file  Occupational History   Occupation: DISABLED    Employer: UNEMPLOYED  Tobacco Use   Smoking status: Every Day    Current packs/day: 0.00    Average packs/day: 2.0 packs/day for 54.0 years (108.0 ttl pk-yrs)    Types: Cigarettes    Start date: 11/20/1958    Last attempt to quit: 11/20/2012  Years since quitting: 11.6   Smokeless tobacco: Never   Tobacco comments:    2 ppd for many years.     04/13/2022 Patient smokes 1/2 pack daily or more if she is nervous  Substance and Sexual Activity   Alcohol use: No    Alcohol/week: 0.0 standard drinks of alcohol   Drug use: No   Sexual activity: Never  Other Topics Concern   Not on file  Social History Narrative   Patient is single with 3 children, 1 deceased.   Patient is right handed.   Patient has 10 th grade  education.   Patient drinks 5 or more cups daily.   Social Drivers of Corporate investment banker Strain: Low Risk  (02/27/2023)   Overall Financial Resource Strain (CARDIA)    Difficulty of Paying Living Expenses: Not hard at all  Food Insecurity: No Food Insecurity (05/27/2024)   Hunger Vital Sign    Worried About Running Out of Food in the Last Year: Never true    Ran Out of Food in the Last Year: Never true  Transportation Needs: No Transportation Needs (05/27/2024)   PRAPARE - Administrator, Civil Service (Medical): No    Lack of Transportation (Non-Medical): No  Physical Activity: Inactive (09/16/2023)   Exercise Vital Sign    Days of Exercise per Week: 0 days    Minutes of Exercise per Session: 0 min  Stress: No Stress Concern Present (09/11/2023)   Received from [facility] - Occupational Stress Questionnaire    Feeling of Stress : Not at all  Social Connections: Socially Isolated (09/16/2023)   Social Connection and Isolation Panel    Frequency of Communication with Friends and Family: More than three times a week    Frequency of Social Gatherings with Friends and Family: Once a week    Attends Religious Services: Never    Database administrator or Organizations: No    Attends Engineer, structural: Never    Marital Status: Divorced    Objective:  BP 112/64   Pulse 81   Temp 98 F (36.7 C) (Temporal)   Resp 18   Ht 5' 5 (1.651 m)   Wt 152 lb 12.8 oz (69.3 kg)   SpO2 97%   BMI 25.43 kg/m      06/30/2024    2:02 PM 05/27/2024    2:08 PM 04/07/2024    1:48 PM  BP/Weight  Systolic BP 112 130 132  Diastolic BP 64 72 74  Wt. (Lbs) 152.8 152 151  BMI 25.43 kg/m2 25.29 kg/m2 25.13 kg/m2    Physical Exam Vitals reviewed.  Constitutional:      Appearance: Normal appearance. She is normal weight.  Neck:     Vascular: No carotid bruit.  Cardiovascular:     Rate and Rhythm: Normal rate and regular  rhythm.     Pulses: Normal pulses.     Heart sounds: Normal heart sounds.  Pulmonary:     Effort: Pulmonary effort is normal. No respiratory distress.     Breath sounds: Normal breath sounds.  Abdominal:     General: Abdomen is flat. Bowel sounds are normal.     Palpations: Abdomen is soft.     Tenderness: There is no abdominal tenderness.  Neurological:     Mental Status: She is alert and oriented to person, place, and time.  Psychiatric:        Mood and Affect:  Mood normal.        Behavior: Behavior normal.      Diabetic foot exam was performed with the following findings:   Normal sensation of 10g monofilament Intact posterior tibialis and dorsalis pedis pulses Right great toenail ingrown drainage      Lab Results  Component Value Date   WBC 10.4 04/07/2024   HGB 13.0 04/07/2024   HCT 42.9 04/07/2024   PLT 276 04/07/2024   GLUCOSE 175 (H) 04/07/2024   CHOL 106 04/07/2024   TRIG 165 (H) 04/07/2024   HDL 40 04/07/2024   LDLCALC 39 04/07/2024   ALT 17 04/07/2024   AST 27 04/07/2024   NA 144 04/07/2024   K 4.1 04/07/2024   CL 105 04/07/2024   CREATININE 1.19 (H) 04/07/2024   BUN 35 (H) 04/07/2024   CO2 21 04/07/2024   TSH 0.734 04/26/2023   INR 1.0 01/31/2019   HGBA1C 6.7 (H) 04/07/2024    Results for orders placed or performed in visit on 04/07/24  Hemoglobin A1c   Collection Time: 04/07/24  2:42 PM  Result Value Ref Range   Hgb A1c MFr Bld 6.7 (H) 4.8 - 5.6 %   Est. average glucose Bld gHb Est-mCnc 146 mg/dL  Lipid panel   Collection Time: 04/07/24  2:42 PM  Result Value Ref Range   Cholesterol, Total 106 100 - 199 mg/dL   Triglycerides 834 (H) 0 - 149 mg/dL   HDL 40 >60 mg/dL   VLDL Cholesterol Cal 27 5 - 40 mg/dL   LDL Chol Calc (NIH) 39 0 - 99 mg/dL   Chol/HDL Ratio 2.7 0.0 - 4.4 ratio  Comprehensive metabolic panel with GFR   Collection Time: 04/07/24  2:42 PM  Result Value Ref Range   Glucose 175 (H) 70 - 99 mg/dL   BUN 35 (H) 8 - 27 mg/dL    Creatinine, Ser 8.80 (H) 0.57 - 1.00 mg/dL   eGFR 48 (L) >40 fO/fpw/8.26   BUN/Creatinine Ratio 29 (H) 12 - 28   Sodium 144 134 - 144 mmol/L   Potassium 4.1 3.5 - 5.2 mmol/L   Chloride 105 96 - 106 mmol/L   CO2 21 20 - 29 mmol/L   Calcium  9.6 8.7 - 10.3 mg/dL   Total Protein 6.5 6.0 - 8.5 g/dL   Albumin 4.1 3.8 - 4.8 g/dL   Globulin, Total 2.4 1.5 - 4.5 g/dL   Bilirubin Total 0.3 0.0 - 1.2 mg/dL   Alkaline Phosphatase 64 44 - 121 IU/L   AST 27 0 - 40 IU/L   ALT 17 0 - 32 IU/L  CBC with Differential/Platelet   Collection Time: 04/07/24  2:42 PM  Result Value Ref Range   WBC 10.4 3.4 - 10.8 x10E3/uL   RBC 4.57 3.77 - 5.28 x10E6/uL   Hemoglobin 13.0 11.1 - 15.9 g/dL   Hematocrit 57.0 65.9 - 46.6 %   MCV 94 79 - 97 fL   MCH 28.4 26.6 - 33.0 pg   MCHC 30.3 (L) 31.5 - 35.7 g/dL   RDW 86.7 88.2 - 84.5 %   Platelets 276 150 - 450 x10E3/uL   Neutrophils 60 Not Estab. %   Lymphs 27 Not Estab. %   Monocytes 9 Not Estab. %   Eos 1 Not Estab. %   Basos 1 Not Estab. %   Neutrophils Absolute 6.3 1.4 - 7.0 x10E3/uL   Lymphocytes Absolute 2.8 0.7 - 3.1 x10E3/uL   Monocytes Absolute 0.9 0.1 - 0.9 x10E3/uL  EOS (ABSOLUTE) 0.1 0.0 - 0.4 x10E3/uL   Basophils Absolute 0.1 0.0 - 0.2 x10E3/uL   Immature Granulocytes 2 Not Estab. %   Immature Grans (Abs) 0.2 (H) 0.0 - 0.1 x10E3/uL  .  Assessment & Plan:   Assessment & Plan Type 2 diabetes mellitus with diabetic neuropathy, without long-term current use of insulin  (HCC) Blood sugar generally controlled with occasional elevations. Dietary habits may contribute. - Continue current diabetes management plan. - Schedule follow-up for blood work after October 15th.    Hypertensive heart disease with chronic combined systolic and diastolic congestive heart failure (HCC) Blood pressure slightly elevated at times on multiple medications. - Continue amlodipine  5 mg daily, valsartan  80 mg daily, chlorthalidone  25 mg 1/2 pill daily and torsemide  20 mg  daily for blood pressure management - on plavix  75 mg daily  - Angina - Nitroglycerin  prescribed but not used - Continue current antihypertensive regimen.    Benign paroxysmal positional vertigo of right ear Dizziness persists despite physical therapy and meclizine . Exercises ineffective due to difficulty following instructions. - Continue physical therapy. - Reassess exercise effectiveness in future visits.    Ingrown right greater toenail Infected ingrown toenail persists despite topical treatment.  - Prescribe oral antibiotic. Keflex  prescription.  - Instruct to contact Dr. Alphonza for further management. I would like her seen in 1 week.    Mixed hyperlipidemia Well controlled.  Extensive but necessary lipid-lowering regimen. No changes to medicines.  Continue fenofibrate  160 mg daily, rosuvastatin  20 mg daily. Vascepa  1 gm 2 capsules twice daily, and zetia  10 mg daily.   Continue to work on eating a healthy diet and exercise.  Check labs in 2 weeks     Severe episode of recurrent major depressive disorder, without psychotic features (HCC) - Continue current antidepressant regimen. - Currently taking Wellbutrin  XL 150 mg and Lamictal  25 mg two daily - Takes Xanax  as needed for anxiety - Again recommend counseling.  - Refused changes to medicines.    Coronary artery disease involving native coronary artery of native heart without angina pectoris Management per specialist.  Continue plavix  and crestor , fenofibrate , and zetia .     Chronic nonintractable headache, unspecified headache type Pain persists despite current regimen. Risk of rebound headaches from goodies powder. - Educate on potential rebound headaches.    COPD mixed type (HCC) COPD with Trelegy and albuterol  use. - Continue Trelegy daily. - Use albuterol  as needed.    Encounter for immunization  Orders:   Flu vaccine HIGH DOSE PF(Fluzone Trivalent)    Body mass index is 25.43 kg/m.    Meds  ordered this encounter  Medications   cephALEXin  (KEFLEX ) 500 MG capsule    Sig: Take 1 capsule (500 mg total) by mouth 2 (two) times daily.    Dispense:  14 capsule    Refill:  0    Orders Placed This Encounter  Procedures   Flu vaccine HIGH DOSE PF(Fluzone Trivalent)     I,Marla I Leal-Borjas,acting as a scribe for Abigail Free, MD.,have documented all relevant documentation on the behalf of Abigail Free, MD,as directed by  Abigail Free, MD while in the presence of Abigail Free, MD.    Follow-up: Return in about 2 weeks (around 07/14/2024).  An After Visit Summary was printed and given to the patient.  I attest that I have reviewed this visit and agree with the plan scribed by my staff.   Abigail Free, MD [facility] [phone]

## 2024-06-30 NOTE — Patient Instructions (Addendum)
  VISIT SUMMARY: Today, we addressed your persistent dizziness, headaches, urinary incontinence, infected ingrown toenail, and other ongoing health issues. We reviewed your current medications and made some adjustments to better manage your symptoms.  YOUR PLAN: BENIGN PAROXYSMAL POSITIONAL VERTIGO: You are experiencing persistent dizziness despite physical therapy and medication. -Continue with physical therapy. -We will reassess the effectiveness of the exercises in future visits.  ONYCHOCRYPTOSIS WITH INFECTION, RIGHT GREAT TOE: You have an infected ingrown toenail that has not improved with topical treatment. -We prescribed an oral antibiotic. keflex  -Contact Dr. Alphonza for further management.  TYPE 2 DIABETES MELLITUS: Your blood sugar levels are generally controlled but occasionally elevated. -Continue your current diabetes management plan. -Schedule a follow-up for blood work after October 15th.  HYPERTENSION: Your blood pressure is slightly elevated at times. -Continue your current antihypertensive regimen.  URINARY INCONTINENCE: You are experiencing urinary incontinence despite current medications. -Continue your current medications. -Follow up with your urologist.  CHRONIC OBSTRUCTIVE PULMONARY DISEASE (COPD): You are managing COPD with Trelegy and albuterol . -Continue taking Trelegy daily. -Use albuterol  as needed.  HYPERLIPIDEMIA: You are on an extensive but necessary lipid-lowering regimen. -Continue your current lipid-lowering therapy.  DEPRESSION: You are managing depression with your current medications. -Continue your current antidepressant regimen.  CORONARY ARTERY DISEASE: You are managing coronary artery disease with Plavix . -Continue taking Plavix .  CHRONIC PAIN: You are experiencing chronic pain and there is a risk of rebound headaches from goodies powder. -Continue your current pain management regimen. -Be aware of the potential for rebound  headaches.  GASTROESOPHAGEAL REFLUX DISEASE (GERD): You are managing GERD with Prevacid . -Continue taking Prevacid .  GENERAL HEALTH MAINTENANCE: You have received your flu shot and completed a recent eye exam. -request records from [facility], specifically lung cancer screening CT. -We will request records from your recent eye exam with Dr. Eyvonne Mow text generated by [employer].                                 Contains text generated by [employer].

## 2024-07-01 ENCOUNTER — Encounter: Payer: Self-pay | Admitting: Family Medicine

## 2024-07-04 NOTE — Assessment & Plan Note (Signed)
 Well controlled.  Extensive but necessary lipid-lowering regimen. No changes to medicines.  Continue fenofibrate  160 mg daily, rosuvastatin  20 mg daily. Vascepa  1 gm 2 capsules twice daily, and zetia  10 mg daily.   Continue to work on eating a healthy diet and exercise.  Check labs in 2 weeks

## 2024-07-04 NOTE — Assessment & Plan Note (Signed)
 COPD with Trelegy and albuterol  use. - Continue Trelegy daily. - Use albuterol  as needed.

## 2024-07-04 NOTE — Assessment & Plan Note (Signed)
 Infected ingrown toenail persists despite topical treatment.  - Prescribe oral antibiotic. - Instruct to contact Dr. Alphonza for further management.

## 2024-07-04 NOTE — Assessment & Plan Note (Signed)
 Dizziness persists despite physical therapy and meclizine . Exercises ineffective due to difficulty following instructions. - Continue physical therapy. - Reassess exercise effectiveness in future visits.

## 2024-07-04 NOTE — Assessment & Plan Note (Signed)
 Management per specialist.  Continue plavix and crestor, fenofibrate, and zetia.

## 2024-07-04 NOTE — Assessment & Plan Note (Signed)
 Blood pressure slightly elevated at times on multiple medications. - Continue current antihypertensive regimen.

## 2024-07-04 NOTE — Assessment & Plan Note (Signed)
 Incontinence persists with current medications. Under urologist care. - Continue current medications. - Follow up with urologist.

## 2024-07-04 NOTE — Assessment & Plan Note (Signed)
 Blood sugar generally controlled with occasional elevations. Dietary habits may contribute. - Continue current diabetes management plan. - Schedule follow-up for blood work after October 15th.

## 2024-07-04 NOTE — Assessment & Plan Note (Signed)
-   Continue current antidepressant regimen. - Currently taking Wellbutrin  XL 150 mg and Lamictal  25 mg two daily - Takes Xanax  as needed for anxiety - Again recommend counseling.  - Refused changes to medicines.

## 2024-07-09 ENCOUNTER — Encounter: Payer: Self-pay | Admitting: Family Medicine

## 2024-07-13 ENCOUNTER — Other Ambulatory Visit

## 2024-07-13 ENCOUNTER — Other Ambulatory Visit: Payer: Self-pay

## 2024-07-13 DIAGNOSIS — I110 Hypertensive heart disease with heart failure: Secondary | ICD-10-CM

## 2024-07-13 DIAGNOSIS — E1140 Type 2 diabetes mellitus with diabetic neuropathy, unspecified: Secondary | ICD-10-CM

## 2024-07-13 DIAGNOSIS — E782 Mixed hyperlipidemia: Secondary | ICD-10-CM

## 2024-07-13 MED ORDER — SOLIFENACIN SUCCINATE 5 MG PO TABS: 5.0000 mg | ORAL_TABLET | Freq: Every day | ORAL | 1 refills | Status: AC

## 2024-07-14 ENCOUNTER — Other Ambulatory Visit: Payer: Self-pay

## 2024-07-14 ENCOUNTER — Ambulatory Visit: Payer: Self-pay | Admitting: Family Medicine

## 2024-07-14 ENCOUNTER — Other Ambulatory Visit

## 2024-07-14 DIAGNOSIS — F332 Major depressive disorder, recurrent severe without psychotic features: Secondary | ICD-10-CM

## 2024-07-14 LAB — CBC WITH DIFFERENTIAL/PLATELET
Basophils Absolute: 0 x10E3/uL (ref 0.0–0.2)
Basos: 0 %
EOS (ABSOLUTE): 0.1 x10E3/uL (ref 0.0–0.4)
Eos: 1 %
Hematocrit: 41 % (ref 34.0–46.6)
Hemoglobin: 13 g/dL (ref 11.1–15.9)
Immature Grans (Abs): 0.1 x10E3/uL (ref 0.0–0.1)
Immature Granulocytes: 1 %
Lymphocytes Absolute: 2.8 x10E3/uL (ref 0.7–3.1)
Lymphs: 27 %
MCH: 29.7 pg (ref 26.6–33.0)
MCHC: 31.7 g/dL (ref 31.5–35.7)
MCV: 94 fL (ref 79–97)
Monocytes Absolute: 0.9 x10E3/uL (ref 0.1–0.9)
Monocytes: 9 %
Neutrophils Absolute: 6.3 x10E3/uL (ref 1.4–7.0)
Neutrophils: 62 %
Platelets: 267 x10E3/uL (ref 150–450)
RBC: 4.38 x10E6/uL (ref 3.77–5.28)
RDW: 12.7 % (ref 11.7–15.4)
WBC: 10.2 x10E3/uL (ref 3.4–10.8)

## 2024-07-14 LAB — COMPREHENSIVE METABOLIC PANEL WITH GFR
ALT: 17 IU/L (ref 0–32)
AST: 23 IU/L (ref 0–40)
Albumin: 3.9 g/dL (ref 3.8–4.8)
Alkaline Phosphatase: 69 IU/L (ref 49–135)
BUN/Creatinine Ratio: 35 — ABNORMAL HIGH (ref 12–28)
BUN: 33 mg/dL — ABNORMAL HIGH (ref 8–27)
Bilirubin Total: 0.2 mg/dL (ref 0.0–1.2)
CO2: 25 mmol/L (ref 20–29)
Calcium: 9.4 mg/dL (ref 8.7–10.3)
Chloride: 103 mmol/L (ref 96–106)
Creatinine, Ser: 0.94 mg/dL (ref 0.57–1.00)
Globulin, Total: 3 g/dL (ref 1.5–4.5)
Glucose: 111 mg/dL — ABNORMAL HIGH (ref 70–99)
Potassium: 3.4 mmol/L — ABNORMAL LOW (ref 3.5–5.2)
Sodium: 143 mmol/L (ref 134–144)
Total Protein: 6.9 g/dL (ref 6.0–8.5)
eGFR: 63 mL/min/1.73 (ref >59)

## 2024-07-14 LAB — LIPID PANEL
Chol/HDL Ratio: 2.8 ratio (ref 0.0–4.4)
Cholesterol, Total: 112 mg/dL (ref 100–199)
HDL: 40 mg/dL (ref >39)
LDL Chol Calc (NIH): 53 mg/dL (ref 0–99)
Triglycerides: 104 mg/dL (ref 0–149)
VLDL Cholesterol Cal: 19 mg/dL (ref 5–40)

## 2024-07-14 LAB — HEMOGLOBIN A1C
Est. average glucose Bld gHb Est-mCnc: 151 mg/dL
Hgb A1c MFr Bld: 6.9 % — ABNORMAL HIGH (ref 4.8–5.6)

## 2024-07-29 ENCOUNTER — Ambulatory Visit: Admitting: Family Medicine

## 2024-07-29 ENCOUNTER — Other Ambulatory Visit: Payer: Self-pay

## 2024-07-29 DIAGNOSIS — F332 Major depressive disorder, recurrent severe without psychotic features: Secondary | ICD-10-CM

## 2024-07-30 NOTE — Progress Notes (Signed)
 Acute Office Visit  Subjective:    Patient ID: Sonya Small, female    DOB: 06-25-1951, 73 y.o.   MRN: [id number]  Chief Complaint  Patient presents with   Knee Pain     Discussed the use of AI scribe software for clinical note transcription with the patient, who gave verbal consent to proceed.  History of Present Illness Sonya Small is a 73 year old female who presents with back pain management issues.  Chronic back pain - Ongoing back pain managed with pain medications - Currently under pain management with regular urine drug screening - No consultation with a back specialist to date since this new pain began. - Appointment made for left knee pain, but the pain is actually coming down left leg from back in a sciatica pattern.  - Patient is having difficulty walking due to pain.   Blood pressure fluctuations - Blood pressure readings fluctuate, with higher values in the morning and normalization later in the day - No association between blood pressure fluctuations and pain that she can tell - Concern regarding effective blood pressure management     Past Medical History:  Diagnosis Date   Anxiety    CAD (coronary artery disease)    a. NSTEMI 11/2008 s/p DES to LCx (3.0x12 Xience); b. NSTEMI 01/2010 secondary to thrombotic RCA lesion (non-obstructive)-->med rx (integrilin x 24 hrs + plavix ); c. 09/2012 negative Myoview .   Chronic diastolic CHF (congestive heart failure) (HCC)    a. 06/2014 Echo: EF 55-60%, no rwma, Gr1 DD, mild AI.   Depression    Dizziness    Drug induced constipation    Ganglion cyst of left foot 01/17/2021   Generalized hyperhidrosis    GERD (gastroesophageal reflux disease)    Headache    Hyperlipidemia    Hypertensive heart disease    Lumbar disc disease    Metabolic encephalopathy    Mixed hyperlipidemia    Myocardial infarction (HCC)    Obstructive sleep apnea    OP (osteoporosis)    Osteoarthritis    Osteoporosis    Other malaise     Overweight(278.02)    PAD (peripheral artery disease)    a. Emboli to R foot 2010 from partially occlusive lesion in R EIA, s/p stenting. - followed by Dr. Oris;  b. 10/2015 ABIs: R 1.03, L 0.97.   Restless leg    Sleep apnea    Stroke [facility])    TIA (transient ischemic attack)    Tobacco abuse    Urge incontinence     Past Surgical History:  Procedure Laterality Date   CYST EXCISION Left 01/2021   left foot   EYE SURGERY     at age 21   hysterectomy -age 24     ILIAC ARTERY STENT     RIGHT ILIAC STENT   KNEE ARTHROSCOPY     LEFT HEART CATH AND CORONARY ANGIOGRAPHY N/A 03/05/2022   Procedure: LEFT HEART CATH AND CORONARY ANGIOGRAPHY;  Surgeon: Dann Candyce RAMAN, MD;  Location: [facility];  Service: Cardiovascular;  Laterality: N/A;   LITHOTRIPSY Left 12/2020   LUMBAR LAMINECTOMY     TUBAL LIGATION      Family History  Problem Relation Age of Onset   Alzheimer's disease Mother    Hodgkin's lymphoma Brother    Lung cancer Brother    Hepatitis C Brother    Hypothyroidism Brother    Hypertension Brother    Depression Brother    Heart disease Other  Grandfather    Social History   Socioeconomic History   Marital status: Divorced    Spouse name: Not on file   Number of children: 3   Years of education: 10 th   Highest education level: Not on file  Occupational History   Occupation: DISABLED    Employer: UNEMPLOYED  Tobacco Use   Smoking status: Every Day    Current packs/day: 0.00    Average packs/day: 2.0 packs/day for 54.0 years (108.0 ttl pk-yrs)    Types: Cigarettes    Start date: 11/20/1958    Last attempt to quit: 11/20/2012    Years since quitting: 11.7   Smokeless tobacco: Never   Tobacco comments:    2 ppd for many years.     04/13/2022 Patient smokes 1/2 pack daily or more if she is nervous  Substance and Sexual Activity   Alcohol use: No    Alcohol/week: 0.0 standard drinks of alcohol   Drug use: No   Sexual activity: Never  Other  Topics Concern   Not on file  Social History Narrative   Patient is single with 3 children, 1 deceased.   Patient is right handed.   Patient has 10 th grade education.   Patient drinks 5 or more cups daily.   Social Drivers of Corporate Investment Banker Strain: Low Risk  (07/15/2024)   Received from [facility]   Overall Financial Resource Strain (CARDIA)    How hard is it for you to pay for the very basics like food, housing, medical care, and heating?: Not hard at all  Food Insecurity: No Food Insecurity (07/15/2024)   Received from [facility]   Hunger Vital Sign    Within the past 12 months, you worried that your food would run out before you got the money to buy more.: Never true    Within the past 12 months, the food you bought just didn't last and you didn't have money to get more.: Never true  Transportation Needs: No Transportation Needs (07/15/2024)   Received from [facility] - Transportation    In the past 12 months, has lack of transportation kept you from medical appointments or from getting medications?: No    In the past 12 months, has lack of transportation kept you from meetings, work, or from getting things needed for daily living?: No  Physical Activity: Inactive (09/16/2023)   Exercise Vital Sign    Days of Exercise per Week: 0 days    Minutes of Exercise per Session: 0 min  Stress: No Stress Concern Present (09/11/2023)   Received from [facility] - Occupational Stress Questionnaire    Feeling of Stress : Not at all  Social Connections: Socially Isolated (09/16/2023)   Social Connection and Isolation Panel    Frequency of Communication with Friends and Family: More than three times a week    Frequency of Social Gatherings with Friends and Family: Once a week    Attends Religious Services: Never    Database Administrator or Organizations: No    Attends Banker Meetings: Never     Marital Status: Divorced  Catering Manager Violence: Not At Risk (05/27/2024)   Humiliation, Afraid, Rape, and Kick questionnaire    Fear of Current or Ex-Partner: No    Emotionally Abused: No    Physically Abused: No    Sexually Abused: No    Outpatient Medications Prior to Visit  Medication Sig Dispense Refill   albuterol  (PROVENTIL ) (2.5 MG/3ML) 0.083% nebulizer solution Take 3 mLs (2.5 mg total) by nebulization every 6 (six) hours as needed for wheezing or shortness of breath. 75 mL 2   albuterol  (VENTOLIN  HFA) 108 (90 Base) MCG/ACT inhaler Inhale 2 puffs into the lungs every 4 (four) hours as needed for wheezing or shortness of breath.     ALPRAZolam  (XANAX ) 0.5 MG tablet Take 1 tablet (0.5 mg total) by mouth 3 (three) times daily. 90 tablet 3   amLODipine  (NORVASC ) 5 MG tablet TAKE 1 TABLET BY MOUTH DAILY 30 tablet 11   Aspirin -Acetaminophen  (GOODYS BODY PAIN PO) Take 1 packet by mouth daily.     baclofen (LIORESAL) 10 MG tablet Take 10 mg by mouth 3 (three) times daily as needed.     blood glucose meter kit and supplies KIT Dispense based on patient and insurance preference. Use up to four times daily as directed. 1 each 0   buPROPion  (WELLBUTRIN  XL) 150 MG 24 hr tablet TAKE 1 TABLET BY MOUTH ONCE DAILY 30 tablet 10   cephALEXin  (KEFLEX ) 500 MG capsule Take 1 capsule (500 mg total) by mouth 2 (two) times daily. 14 capsule 0   chlorthalidone  (HYGROTON ) 25 MG tablet TAKE 1/2 TABLET BY MOUTH EVERY DAY 45 tablet 3   clopidogrel  (PLAVIX ) 75 MG tablet TAKE 1 TABLET BY MOUTH EVERY MORNING 30 tablet 11   ezetimibe  (ZETIA ) 10 MG tablet TAKE 1 TABLET BY MOUTH ONCE DAILY 30 tablet 11   fenofibrate  160 MG tablet TAKE 1 TABLET BY MOUTH EVERY MORNING 30 tablet 11   glucose blood (ONETOUCH ULTRA) test strip DISPENSE BASED ON PATIENT AND INSURANCE PREFERENCE. USE UP TO FOUR TIMES DAILY AS DIRECTED. 400 strip 3   lamoTRIgine  (LAMICTAL ) 25 MG tablet TAKE 2 TABLETS BY MOUTH AT BEDTIME 60 tablet 11    lansoprazole  (PREVACID ) 30 MG capsule TAKE ONE (1) CAPSULE BY MOUTH ONCE DAILY BEFORE BREAKFAST 30 capsule 11   meclizine  (ANTIVERT ) 25 MG tablet Take 1 tablet (25 mg total) by mouth 3 (three) times daily as needed for dizziness. 30 tablet 0   MYRBETRIQ  50 MG TB24 tablet Take 50 mg by mouth daily.     naloxone (NARCAN) nasal spray 4 mg/0.1 mL      nitroGLYCERIN  (NITROSTAT ) 0.4 MG SL tablet Dissolve 1 tab under tongue as needed for chest pain. May repeat every 5 minutes x 2 doses. If no relief call 9-1-1. 25 tablet 2   oxyCODONE -acetaminophen  (PERCOCET) 10-325 MG tablet Take 0.5 tablets by mouth in the morning and at bedtime.     OZEMPIC , 2 MG/DOSE, 8 MG/3ML SOPN INJECT 2MG  SUBCUTANEOUSLY ONCE WEEKLY 9 mL 11   rosuvastatin  (CRESTOR ) 20 MG tablet TAKE 1 TABLET BY MOUTH AT BEDTIME 30 tablet 11   solifenacin  (VESICARE ) 5 MG tablet Take 1 tablet (5 mg total) by mouth daily. 90 tablet 1   torsemide  (DEMADEX ) 20 MG tablet TAKE 1 TABLET BY MOUTH DAILY 30 tablet 11   TRELEGY ELLIPTA 200-62.5-25 MCG/ACT AEPB Inhale 1 puff into the lungs daily.     TRUEplus Lancets 30G MISC 1 each by Does not apply route 2 (two) times daily. E11.69 400 each 3   valACYclovir  (VALTREX ) 1000 MG tablet TAKE 2 TABLETS BY MOUTH TWICE DAILY AS NEEDED 20 tablet 2   valsartan  (DIOVAN ) 80 MG tablet Take 1 tablet (80 mg total) by mouth daily. 90 tablet 1   VASCEPA  1 g capsule TAKE 2 CAPSULES BY MOUTH  TWICE DAILY 120 capsule 10   No facility-administered medications prior to visit.    Allergies  Allergen Reactions   Latex Rash   Abilify  [Aripiprazole ]     confusion    Review of Systems  Constitutional:  Negative for activity change.  Musculoskeletal:  Positive for back pain and gait problem.       Objective:        07/31/2024   10:39 AM 06/30/2024    2:02 PM 05/27/2024    2:08 PM  Vitals with BMI  Height 5' 5 5' 5 5' 5  Weight 150 lbs 152 lbs 13 oz 152 lbs  BMI 24.96 25.43 25.29  Systolic 134 112 869  Diastolic  86 64 72  Pulse 95 81 86    No data found.   Physical Exam Vitals reviewed.  Constitutional:      Appearance: Normal appearance.  Musculoskeletal:     Comments: LEFT KNEE EXAM Tender: negative Patellar apprehension: negative. Latera ligament laxity: negative McMurray's signs: negative Anterior drawer movement/Lachmans: negative Posterior drawer movement: negative   LEFT HIP EXAM FROM WITHOUT DISCOMFORT IN LEFT HIP  LUMBAR EXAM TENDER MIDLINE AND OVER LEFT BUTTOCK.  POSITIVE SLR ON LEFT. NEGATIVE SLR ON RIGHT.   Neurological:     Mental Status: She is alert.     Health Maintenance Due  Topic Date Due   DTaP/Tdap/Td (1 - Tdap) Never done   Colonoscopy  Never done   Zoster Vaccines- Shingrix (1 of 2) Never done   Lung Cancer Screening  03/02/2022   COVID-19 Vaccine (3 - 2025-26 season) 05/25/2024   Medicare Annual Wellness (AWV)  09/15/2024    There are no preventive care reminders to display for this patient.   Lab Results  Component Value Date   TSH 0.734 04/26/2023   Lab Results  Component Value Date   WBC 10.2 07/13/2024   HGB 13.0 07/13/2024   HCT 41.0 07/13/2024   MCV 94 07/13/2024   PLT 267 07/13/2024   Lab Results  Component Value Date   NA 143 07/13/2024   K 3.4 (L) 07/13/2024   CO2 25 07/13/2024   GLUCOSE 111 (H) 07/13/2024   BUN 33 (H) 07/13/2024   CREATININE 0.94 07/13/2024   BILITOT 0.2 07/13/2024   ALKPHOS 69 07/13/2024   AST 23 07/13/2024   ALT 17 07/13/2024   PROT 6.9 07/13/2024   ALBUMIN 3.9 07/13/2024   CALCIUM  9.4 07/13/2024   ANIONGAP 10 10/19/2020   EGFR 63 07/13/2024   Lab Results  Component Value Date   CHOL 112 07/13/2024   Lab Results  Component Value Date   HDL 40 07/13/2024   Lab Results  Component Value Date   LDLCALC 53 07/13/2024   Lab Results  Component Value Date   TRIG 104 07/13/2024   Lab Results  Component Value Date   CHOLHDL 2.8 07/13/2024   Lab Results  Component Value Date   HGBA1C 6.9  (H) 07/13/2024        Results for orders placed or performed in visit on 07/13/24  CBC with Differential/Platelet   Collection Time: 07/13/24  4:32 PM  Result Value Ref Range   WBC 10.2 3.4 - 10.8 x10E3/uL   RBC 4.38 3.77 - 5.28 x10E6/uL   Hemoglobin 13.0 11.1 - 15.9 g/dL   Hematocrit 58.9 65.9 - 46.6 %   MCV 94 79 - 97 fL   MCH 29.7 26.6 - 33.0 pg   MCHC 31.7 31.5 - 35.7 g/dL  RDW 12.7 11.7 - 15.4 %   Platelets 267 150 - 450 x10E3/uL   Neutrophils 62 Not Estab. %   Lymphs 27 Not Estab. %   Monocytes 9 Not Estab. %   Eos 1 Not Estab. %   Basos 0 Not Estab. %   Neutrophils Absolute 6.3 1.4 - 7.0 x10E3/uL   Lymphocytes Absolute 2.8 0.7 - 3.1 x10E3/uL   Monocytes Absolute 0.9 0.1 - 0.9 x10E3/uL   EOS (ABSOLUTE) 0.1 0.0 - 0.4 x10E3/uL   Basophils Absolute 0.0 0.0 - 0.2 x10E3/uL   Immature Granulocytes 1 Not Estab. %   Immature Grans (Abs) 0.1 0.0 - 0.1 x10E3/uL  Comprehensive metabolic panel with GFR   Collection Time: 07/13/24  4:32 PM  Result Value Ref Range   Glucose 111 (H) 70 - 99 mg/dL   BUN 33 (H) 8 - 27 mg/dL   Creatinine, Ser 9.05 0.57 - 1.00 mg/dL   eGFR 63 >40 fO/fpw/8.26   BUN/Creatinine Ratio 35 (H) 12 - 28   Sodium 143 134 - 144 mmol/L   Potassium 3.4 (L) 3.5 - 5.2 mmol/L   Chloride 103 96 - 106 mmol/L   CO2 25 20 - 29 mmol/L   Calcium  9.4 8.7 - 10.3 mg/dL   Total Protein 6.9 6.0 - 8.5 g/dL   Albumin 3.9 3.8 - 4.8 g/dL   Globulin, Total 3.0 1.5 - 4.5 g/dL   Bilirubin Total 0.2 0.0 - 1.2 mg/dL   Alkaline Phosphatase 69 49 - 135 IU/L   AST 23 0 - 40 IU/L   ALT 17 0 - 32 IU/L  Lipid panel   Collection Time: 07/13/24  4:32 PM  Result Value Ref Range   Cholesterol, Total 112 100 - 199 mg/dL   Triglycerides 895 0 - 149 mg/dL   HDL 40 >60 mg/dL   VLDL Cholesterol Cal 19 5 - 40 mg/dL   LDL Chol Calc (NIH) 53 0 - 99 mg/dL   Chol/HDL Ratio 2.8 0.0 - 4.4 ratio  Hemoglobin A1c   Collection Time: 07/13/24  4:32 PM  Result Value Ref Range   Hgb A1c MFr Bld  6.9 (H) 4.8 - 5.6 %   Est. average glucose Bld gHb Est-mCnc 151 mg/dL     Assessment & Plan:   Assessment & Plan Sciatica of left side associated with disorder of lumbar spine Pain radiating down the leg suggests a pinched nerve. No prior sciatica history. Prednisone considered to alleviate symptoms. Discussed potential side effects, including increased blood sugar. - Prescribed prednisone for sciatica symptoms. - Advised monitoring blood sugar levels while on prednisone.    Diabetic polyneuropathy associated with type 2 diabetes mellitus (HCC) Discussed need to monitor blood sugar closely due to potential impact of prednisone. - Monitor blood sugar levels closely while on prednisone.D       Body mass index is 24.96 kg/m..  Meds ordered this encounter  Medications   predniSONE (DELTASONE) 50 MG tablet    Sig: Take 1 tablet (50 mg total) by mouth daily with breakfast.    Dispense:  5 tablet    Refill:  0    No orders of the defined types were placed in this encounter.    Follow-up: Return if symptoms worsen or fail to improve.  An After Visit Summary was printed and given to the patient.  Abigail Free, MD [facility] [phone]

## 2024-07-31 ENCOUNTER — Ambulatory Visit (INDEPENDENT_AMBULATORY_CARE_PROVIDER_SITE_OTHER): Admitting: Family Medicine

## 2024-07-31 VITALS — BP 134/86 | HR 95 | Temp 98.1°F | Ht 65.0 in | Wt 150.0 lb

## 2024-07-31 DIAGNOSIS — M5386 Other specified dorsopathies, lumbar region: Secondary | ICD-10-CM | POA: Diagnosis not present

## 2024-07-31 DIAGNOSIS — E1142 Type 2 diabetes mellitus with diabetic polyneuropathy: Secondary | ICD-10-CM | POA: Diagnosis not present

## 2024-07-31 MED ORDER — PREDNISONE 50 MG PO TABS: 50.0000 mg | ORAL_TABLET | Freq: Every day | ORAL | 0 refills | Status: AC

## 2024-08-02 ENCOUNTER — Encounter: Payer: Self-pay | Admitting: Family Medicine

## 2024-08-02 DIAGNOSIS — M5386 Other specified dorsopathies, lumbar region: Secondary | ICD-10-CM | POA: Insufficient documentation

## 2024-08-02 NOTE — Assessment & Plan Note (Addendum)
 Pain radiating down the leg suggests a pinched nerve. No prior sciatica history. Prednisone considered to alleviate symptoms. Discussed potential side effects, including increased blood sugar. - Prescribed prednisone for sciatica symptoms. - Advised monitoring blood sugar levels while on prednisone.

## 2024-08-02 NOTE — Assessment & Plan Note (Addendum)
 Discussed need to monitor blood sugar closely due to potential impact of prednisone. - Monitor blood sugar levels closely while on prednisone.D

## 2024-08-24 NOTE — Progress Notes (Deleted)
 Cardiology Office Note    Date:  08/24/2024   ID:  Sonya Small, Sonya Small 07/17/1951, MRN [id number]  PCP:  Sherre Clapper, MD  Cardiologist:  Dr. Minna Dumire  No chief complaint on file.   History of Present Illness:  Sonya Small is a 73 y.o. female with PMH of HTN, HLD, OSA, PAD, CVA, chronic diastolic heart failure and CAD.  Patient had a NSTEMI in March 2010 and underwent DES to left circumflex.  She had another NSTEMI in May 2011 secondary to thrombotic RCA lesion, cardiac catheterization showed nonobstructive disease, medical therapy was recommended.  She was admitted for recurrent chest pain in January 2014, Myoview  was negative.  She was admitted for acute CVA in October 2015 after presenting with slurred speech and facial drooping.  MRI showed acute right MCA infarct involving basal ganglia and periventricular white matter.  She was started on aspirin  along with Plavix .  Myoview  obtained in August 2017 showed no ischemia, normal EF.   ABI obtained in July 2018 was normal.    She was seen by Scot Ford PA-C on 03/13/2018  with chest pain.  A Myoview  study on 03/25/2018 which showed EF 52%, small sized moderate intensity fixed apical perfusion defect likely attenuation artifact was seen, no reversible ischemia otherwise.  Overall this is a low risk stress test.   She underwent cardiac cath in October 2023 which showed no significant obstructive CAD. She also had follow up LE arterial vascular dopplers which were stable with good ABIs.   She was seen by Damien Braver NP on 12/28/22. Noted BP was low. Lisinopril  HCT was cut in half. Later when seen by Dr Sherre this was discontinued. Now on torsemide  10 mg daily for leg swelling. Wearing compression hose. Notes she is sleeping all the time. Has seen pulmonary in . Had sleep study per her report and they recommended CPAP which she could not tolerate. They are now recommending oxygen at night now. She is still smoking.   Was seen in June 2024 for  complaints of swelling. BNP was normal. Echo showed good cardiac function.  On follow up today she is doing OK. SOB is stable. No chest pain. BP has been excellent. LE edema well controlled with diuretics and compression hose. Still smoking. No longer using oxygen at night.     Past Medical History:  Diagnosis Date   Anxiety    CAD (coronary artery disease)    a. NSTEMI 11/2008 s/p DES to LCx (3.0x12 Xience); b. NSTEMI 01/2010 secondary to thrombotic RCA lesion (non-obstructive)-->med rx (integrilin x 24 hrs + plavix ); c. 09/2012 negative Myoview .   Chronic diastolic CHF (congestive heart failure) (HCC)    a. 06/2014 Echo: EF 55-60%, no rwma, Gr1 DD, mild AI.   Depression    Dizziness    Drug induced constipation    Ganglion cyst of left foot 01/17/2021   Generalized hyperhidrosis    GERD (gastroesophageal reflux disease)    Headache    Hyperlipidemia    Hypertensive heart disease    Lumbar disc disease    Metabolic encephalopathy    Mixed hyperlipidemia    Myocardial infarction (HCC)    Obstructive sleep apnea    OP (osteoporosis)    Osteoarthritis    Osteoporosis    Other malaise    Overweight(278.02)    PAD (peripheral artery disease)    a. Emboli to R foot 2010 from partially occlusive lesion in R EIA, s/p stenting. - followed by Dr. Oris;  b. 10/2015 ABIs: R 1.03, L 0.97.   Restless leg    Sleep apnea    Stroke [facility])    TIA (transient ischemic attack)    Tobacco abuse    Urge incontinence     Past Surgical History:  Procedure Laterality Date   CYST EXCISION Left 01/2021   left foot   EYE SURGERY     at age 34   hysterectomy -age 74     ILIAC ARTERY STENT     RIGHT ILIAC STENT   KNEE ARTHROSCOPY     LEFT HEART CATH AND CORONARY ANGIOGRAPHY N/A 03/05/2022   Procedure: LEFT HEART CATH AND CORONARY ANGIOGRAPHY;  Surgeon: Dann Candyce RAMAN, MD;  Location: [facility];  Service: Cardiovascular;  Laterality: N/A;   LITHOTRIPSY Left 12/2020   LUMBAR  LAMINECTOMY     TUBAL LIGATION      Current Medications: Outpatient Medications Prior to Visit  Medication Sig Dispense Refill   albuterol  (PROVENTIL ) (2.5 MG/3ML) 0.083% nebulizer solution Take 3 mLs (2.5 mg total) by nebulization every 6 (six) hours as needed for wheezing or shortness of breath. 75 mL 2   albuterol  (VENTOLIN  HFA) 108 (90 Base) MCG/ACT inhaler Inhale 2 puffs into the lungs every 4 (four) hours as needed for wheezing or shortness of breath.     ALPRAZolam  (XANAX ) 0.5 MG tablet Take 1 tablet (0.5 mg total) by mouth 3 (three) times daily. 90 tablet 3   amLODipine  (NORVASC ) 5 MG tablet TAKE 1 TABLET BY MOUTH DAILY 30 tablet 11   Aspirin -Acetaminophen  (GOODYS BODY PAIN PO) Take 1 packet by mouth daily.     baclofen (LIORESAL) 10 MG tablet Take 10 mg by mouth 3 (three) times daily as needed.     blood glucose meter kit and supplies KIT Dispense based on patient and insurance preference. Use up to four times daily as directed. 1 each 0   buPROPion  (WELLBUTRIN  XL) 150 MG 24 hr tablet TAKE 1 TABLET BY MOUTH ONCE DAILY 30 tablet 10   cephALEXin  (KEFLEX ) 500 MG capsule Take 1 capsule (500 mg total) by mouth 2 (two) times daily. 14 capsule 0   chlorthalidone  (HYGROTON ) 25 MG tablet TAKE 1/2 TABLET BY MOUTH EVERY DAY 45 tablet 3   clopidogrel  (PLAVIX ) 75 MG tablet TAKE 1 TABLET BY MOUTH EVERY MORNING 30 tablet 11   ezetimibe  (ZETIA ) 10 MG tablet TAKE 1 TABLET BY MOUTH ONCE DAILY 30 tablet 11   fenofibrate  160 MG tablet TAKE 1 TABLET BY MOUTH EVERY MORNING 30 tablet 11   glucose blood (ONETOUCH ULTRA) test strip DISPENSE BASED ON PATIENT AND INSURANCE PREFERENCE. USE UP TO FOUR TIMES DAILY AS DIRECTED. 400 strip 3   lamoTRIgine  (LAMICTAL ) 25 MG tablet TAKE 2 TABLETS BY MOUTH AT BEDTIME 60 tablet 11   lansoprazole  (PREVACID ) 30 MG capsule TAKE ONE (1) CAPSULE BY MOUTH ONCE DAILY BEFORE BREAKFAST 30 capsule 11   meclizine  (ANTIVERT ) 25 MG tablet Take 1 tablet (25 mg total) by mouth 3 (three)  times daily as needed for dizziness. 30 tablet 0   MYRBETRIQ  50 MG TB24 tablet Take 50 mg by mouth daily.     naloxone (NARCAN) nasal spray 4 mg/0.1 mL      nitroGLYCERIN  (NITROSTAT ) 0.4 MG SL tablet Dissolve 1 tab under tongue as needed for chest pain. May repeat every 5 minutes x 2 doses. If no relief call 9-1-1. 25 tablet 2   oxyCODONE -acetaminophen  (PERCOCET) 10-325 MG tablet Take 0.5 tablets by mouth in  the morning and at bedtime.     OZEMPIC , 2 MG/DOSE, 8 MG/3ML SOPN INJECT 2MG  SUBCUTANEOUSLY ONCE WEEKLY 9 mL 11   predniSONE  (DELTASONE ) 50 MG tablet Take 1 tablet (50 mg total) by mouth daily with breakfast. 5 tablet 0   rosuvastatin  (CRESTOR ) 20 MG tablet TAKE 1 TABLET BY MOUTH AT BEDTIME 30 tablet 11   solifenacin  (VESICARE ) 5 MG tablet Take 1 tablet (5 mg total) by mouth daily. 90 tablet 1   torsemide  (DEMADEX ) 20 MG tablet TAKE 1 TABLET BY MOUTH DAILY 30 tablet 11   TRELEGY ELLIPTA 200-62.5-25 MCG/ACT AEPB Inhale 1 puff into the lungs daily.     TRUEplus Lancets 30G MISC 1 each by Does not apply route 2 (two) times daily. E11.69 400 each 3   valACYclovir  (VALTREX ) 1000 MG tablet TAKE 2 TABLETS BY MOUTH TWICE DAILY AS NEEDED 20 tablet 2   valsartan  (DIOVAN ) 80 MG tablet Take 1 tablet (80 mg total) by mouth daily. 90 tablet 1   VASCEPA  1 g capsule TAKE 2 CAPSULES BY MOUTH TWICE DAILY 120 capsule 10   No facility-administered medications prior to visit.     Allergies:   Latex and Abilify  [aripiprazole ]   Social History   Socioeconomic History   Marital status: Divorced    Spouse name: Not on file   Number of children: 3   Years of education: 10 th   Highest education level: Not on file  Occupational History   Occupation: DISABLED    Employer: UNEMPLOYED  Tobacco Use   Smoking status: Every Day    Current packs/day: 0.00    Average packs/day: 2.0 packs/day for 54.0 years (108.0 ttl pk-yrs)    Types: Cigarettes    Start date: 11/20/1958    Last attempt to quit: 11/20/2012     Years since quitting: 11.7   Smokeless tobacco: Never   Tobacco comments:    2 ppd for many years.     04/13/2022 Patient smokes 1/2 pack daily or more if she is nervous  Substance and Sexual Activity   Alcohol use: No    Alcohol/week: 0.0 standard drinks of alcohol   Drug use: No   Sexual activity: Never  Other Topics Concern   Not on file  Social History Narrative   Patient is single with 3 children, 1 deceased.   Patient is right handed.   Patient has 10 th grade education.   Patient drinks 5 or more cups daily.   Social Drivers of Corporate Investment Banker Strain: Low Risk  (07/15/2024)   Received from [facility]   Overall Financial Resource Strain (CARDIA)    How hard is it for you to pay for the very basics like food, housing, medical care, and heating?: Not hard at all  Food Insecurity: No Food Insecurity (07/15/2024)   Received from [facility]   Hunger Vital Sign    Within the past 12 months, you worried that your food would run out before you got the money to buy more.: Never true    Within the past 12 months, the food you bought just didn't last and you didn't have money to get more.: Never true  Transportation Needs: No Transportation Needs (07/15/2024)   Received from [facility] - Transportation    In the past 12 months, has lack of transportation kept you from medical appointments or from getting medications?: No    In the past 12 months, has lack of transportation kept you  from meetings, work, or from getting things needed for daily living?: No  Physical Activity: Inactive (09/16/2023)   Exercise Vital Sign    Days of Exercise per Week: 0 days    Minutes of Exercise per Session: 0 min  Stress: No Stress Concern Present (09/11/2023)   Received from [facility] - Occupational Stress Questionnaire    Feeling of Stress : Not at all  Social Connections: Socially Isolated (09/16/2023)   Social  Connection and Isolation Panel    Frequency of Communication with Friends and Family: More than three times a week    Frequency of Social Gatherings with Friends and Family: Once a week    Attends Religious Services: Never    Database Administrator or Organizations: No    Attends Engineer, Structural: Never    Marital Status: Divorced     Family History:  The patient's family history includes Alzheimer's disease in her mother; Depression in her brother; Heart disease in an other family member; Hepatitis C in her brother; Hodgkin's lymphoma in her brother; Hypertension in her brother; Hypothyroidism in her brother; Lung cancer in her brother.   ROS:   Please see the history of present illness.    ROS All other systems reviewed and are negative.   PHYSICAL EXAM:   VS:  There were no vitals taken for this visit.   GEN: Well nourished, well developed, in no acute distress  HEENT: normal  Neck: no JVD, carotid bruits, or masses Cardiac: RRR; no murmurs, rubs, or gallops,no edema. Pedal pulses are palpable Respiratory:  clear to auscultation bilaterally, normal work of breathing GI: soft, nontender, nondistended, + BS MS: no deformity or atrophy  Legs: compression hose in place. No edema.  Skin: warm and dry, no rash Neuro:  Alert and Oriented x 3, Strength and sensation are intact Psych: euthymic mood, full affect  Wt Readings from Last 3 Encounters:  07/31/24 150 lb (68 kg)  06/30/24 152 lb 12.8 oz (69.3 kg)  05/27/24 152 lb (68.9 kg)      Studies/Labs Reviewed:   EKG:  EKG is not ordered today    Recent Labs: 07/13/2024: ALT 17; BUN 33; Creatinine, Ser 0.94; Hemoglobin 13.0; Platelets 267; Potassium 3.4; Sodium 143   Lipid Panel    Component Value Date/Time   CHOL 112 07/13/2024 1632   TRIG 104 07/13/2024 1632   HDL 40 07/13/2024 1632   CHOLHDL 2.8 07/13/2024 1632   CHOLHDL 5.0 07/13/2014 0108   VLDL 42 (H) 07/13/2014 0108   LDLCALC 53 07/13/2024 1632    Labs dated 06/17/18: CBC normal except for elevated WBC 13.3.  Creatinine 1.14. Potassium 5.5.  Dated 05/23/18: cholesterol 142, triglycerides 212, HDL 35, LDL 65.  Dated 07/29/18: Normal chemistries and CBC.  Dated 09/02/19: cholesterol 165, triglycerides 146, HDL 49, LDL 62. A1c 7%. Creatinine 0.56. potassium 3.3. CBC and TSH normal. Dated 01/06/24: cholesterol 110, triglycerides 184, HDL 40, LDL 40. A1c 7.1%. CMET and CBC normal.   Additional studies/ records that were reviewed today include:   Myoview  03/25/2018 The left ventricular ejection fraction is mildly decreased (45-54%). Nuclear stress EF: 52%. No T wave inversion was noted during stress. There was no ST segment deviation noted during stress. Defect 1: There is a small defect of moderate severity. This is a low risk study.   Small size, moderate intensity fixed apical perfusion defect, likely attenuation artifact. No reversible ischemia. LVEF 52% with normal wall  motion. This is a low risk study.  Myoview  08/22/21: Study Highlights      Findings are consistent with no prior ischemia. The study is low risk.   No ST deviation was noted.   LV perfusion is abnormal. Defect 1: There is a small defect with mild reduction in uptake present in the apical apex location(s) that is fixed. There is normal wall motion in the defect area. Consistent with artifact caused by breast attenuation.   Left ventricular function is normal. End diastolic cavity size is normal. End systolic cavity size is normal.   Prior study available for comparison from 03/25/2018. No changes compared to prior study. LVEF 52%, fixed apical attenuation artifact   Small size, mild intensity fixed apical perfusion defect, likely attenuation artifact. No reversible ischemia. LVEF 52% with normal wall motion. This is a low risk study. Compared to a prior study in 2019, there are no changes (the apical perfusion defect was previously reported) - LVEF is  unchanged.   Cardiac cath 03/05/22:   LEFT HEART CATH AND CORONARY ANGIOGRAPHY   Conclusion      1st Mrg lesion is 25% stenosed.   Previously placed Mid Cx stent of unknown type is  widely patent.   The left ventricular systolic function is normal.   LV end diastolic pressure is mildly elevated.   The left ventricular ejection fraction is 45-50% by visual estimate.  Apical hypokinesis consistent with Takotsubo cardiomyopathy.   There is no aortic valve stenosis.   No significant coronary artery disease.  She does have apical hypokinesis which is consistent with a Takotsubo cardiomyopathy pattern.  She has been under a lot of stress of late.  The apical hypokinesis is minimal so I suspect her Takotsubo is in the process of resolving.  Continue aggressive blood pressure control.  Consider echocardiogram in 6 weeks.   LVEDP was mildly elevated.  Could consider short-term diuretic as well if she has significant shortness of breath.  Coronary Diagrams  Diagnostic Dominance: Right  Intervention  Echo 04/09/23: IMPRESSIONS     1. Left ventricular ejection fraction, by estimation, is 55 to 60%. The  left ventricle has normal function. The left ventricle has no regional  wall motion abnormalities. There is mild asymmetric left ventricular  hypertrophy of the septal segment. Left  ventricular diastolic parameters are consistent with Grade I diastolic  dysfunction (impaired relaxation). The average left ventricular global  longitudinal strain is -14.1 %. The global longitudinal strain is  abnormal.   2. Right ventricular systolic function is normal. The right ventricular  size is normal. There is normal pulmonary artery systolic pressure.   3. The mitral valve is normal in structure. Mild mitral valve  regurgitation. No evidence of mitral stenosis.   4. The aortic valve is normal in structure. Aortic valve regurgitation is  mild. No aortic stenosis is present.   5. Aneurysm of the  ascending aorta, measuring 40 mm.   6. The inferior vena cava is normal in size with greater than 50%  respiratory variability, suggesting right atrial pressure of 3 mmHg.   ASSESSMENT:    No diagnosis found.       PLAN:  In order of problems listed above:  CAD: remote stent of mid LCx in 2010 with 3.0 x 12 DES. In 2011 had NSTEMI related to thrombotic event in distal RCA- nonobstructive. Myoview  in November 2022  was normal.  Cardiac cath in June 2023  was unremarkable. Continue aspirin  and Plavix . No significant angina.  Hypertension: Blood pressure is well controlled.   Carotid artery disease: She has a history of mild carotid artery disease.  LE edema multifactorial with Chronic diastolic heart failure and venous insufficiency. Sodium restriction. Elevation of feet, compression hose.   5.   Dyslipidemia. On Crestor  and Vascepa . LDL 40 -excellent.   6.   Tobacco abuse. Recommend smoking cessation.   7.  PAD. Stable LE vascular studies.   8.  OSA   Will follow up 6-8 months.     Medication Adjustments/Labs and Tests Ordered: Current medicines are reviewed at length with the patient today.  Concerns regarding medicines are outlined above.  Medication changes, Labs and Tests ordered today are listed in the Patient Instructions below. There are no Patient Instructions on file for this visit.    Signed, Aila Terra, MD  08/24/2024 8:56 AM    [facility] [address] [place], [place], [place]  [address] Phone: [phone]; Fax: [phone]

## 2024-08-25 ENCOUNTER — Other Ambulatory Visit: Payer: Self-pay

## 2024-08-25 MED ORDER — LANSOPRAZOLE 30 MG PO CPDR: 30.0000 mg | DELAYED_RELEASE_CAPSULE | Freq: Every day | ORAL | 11 refills | Status: DC

## 2024-08-25 MED ORDER — VALSARTAN 80 MG PO TABS: 80.0000 mg | ORAL_TABLET | Freq: Every day | ORAL | 1 refills | Status: AC

## 2024-08-26 ENCOUNTER — Other Ambulatory Visit: Payer: Self-pay

## 2024-08-26 ENCOUNTER — Other Ambulatory Visit: Payer: Self-pay | Admitting: Family Medicine

## 2024-08-26 DIAGNOSIS — E1140 Type 2 diabetes mellitus with diabetic neuropathy, unspecified: Secondary | ICD-10-CM

## 2024-08-26 DIAGNOSIS — F332 Major depressive disorder, recurrent severe without psychotic features: Secondary | ICD-10-CM

## 2024-08-26 DIAGNOSIS — E1142 Type 2 diabetes mellitus with diabetic polyneuropathy: Secondary | ICD-10-CM

## 2024-08-26 MED ORDER — ONETOUCH ULTRA VI STRP: ORAL_STRIP | 1 refills | Status: AC

## 2024-08-26 MED ORDER — TRUEPLUS LANCETS 30G MISC: 1.0000 | Freq: Every day | 1 refills | Status: AC

## 2024-08-26 MED ORDER — LANCETS MISC: 1.0000 | 0 refills | Status: DC

## 2024-08-26 MED ORDER — BLOOD GLUCOSE MONITORING SUPPL DEVI: 1.0000 | 0 refills | Status: DC

## 2024-08-27 ENCOUNTER — Other Ambulatory Visit: Payer: Self-pay | Admitting: Family Medicine

## 2024-08-27 DIAGNOSIS — F332 Major depressive disorder, recurrent severe without psychotic features: Secondary | ICD-10-CM

## 2024-08-27 MED ORDER — ALPRAZOLAM 0.5 MG PO TABS: 0.5000 mg | ORAL_TABLET | Freq: Three times a day (TID) | ORAL | 3 refills | Status: DC

## 2024-08-28 ENCOUNTER — Other Ambulatory Visit: Payer: Self-pay

## 2024-08-28 DIAGNOSIS — F332 Major depressive disorder, recurrent severe without psychotic features: Secondary | ICD-10-CM

## 2024-08-28 MED ORDER — ALPRAZOLAM 0.5 MG PO TABS: 0.5000 mg | ORAL_TABLET | Freq: Three times a day (TID) | ORAL | 0 refills | Status: DC

## 2024-08-31 ENCOUNTER — Ambulatory Visit: Admitting: Cardiology

## 2024-09-11 ENCOUNTER — Other Ambulatory Visit: Payer: Self-pay | Admitting: Cardiology

## 2024-09-21 ENCOUNTER — Encounter: Payer: Self-pay | Admitting: Family Medicine

## 2024-09-21 ENCOUNTER — Ambulatory Visit: Admitting: Family Medicine

## 2024-09-21 VITALS — BP 118/64 | HR 87 | Temp 98.0°F | Ht 65.0 in | Wt 150.0 lb

## 2024-09-21 DIAGNOSIS — Z1211 Encounter for screening for malignant neoplasm of colon: Secondary | ICD-10-CM | POA: Insufficient documentation

## 2024-09-21 DIAGNOSIS — Z0000 Encounter for general adult medical examination without abnormal findings: Secondary | ICD-10-CM | POA: Diagnosis not present

## 2024-09-21 DIAGNOSIS — F331 Major depressive disorder, recurrent, moderate: Secondary | ICD-10-CM

## 2024-09-21 MED ORDER — LANSOPRAZOLE 30 MG PO CPDR: 30.0000 mg | DELAYED_RELEASE_CAPSULE | Freq: Every day | ORAL | 11 refills | Status: AC

## 2024-09-21 MED ORDER — ALPRAZOLAM 0.5 MG PO TABS: 0.5000 mg | ORAL_TABLET | Freq: Three times a day (TID) | ORAL | 0 refills | Status: AC

## 2024-09-21 NOTE — Patient Instructions (Addendum)
 [facility] [address] [employer] [place], [place] [address] Phone: [phone]  CALL TO MAKE AN APPOINTMENT WITH A COUNSELOR AND THE PSYCHIATRIC NURSE PRACTITIONER PLEASE!  Please try to get railings on your steps.   Ordered cologuard for colon cancer screening.

## 2024-09-21 NOTE — Assessment & Plan Note (Signed)
 Call [facility] to set up appointment. Orders:   ALPRAZolam  (XANAX ) 0.5 MG tablet; Take 1 tablet (0.5 mg total) by mouth 3 (three) times daily.

## 2024-09-21 NOTE — Progress Notes (Signed)
 "  Chief Complaint  Patient presents with   Medicare Wellness     Subjective:   Sonya Small is a 73 y.o. female who presents for a Medicare Annual Wellness Visit.  Visit info / Clinical Intake: Medicare Wellness Visit Type:: Subsequent Annual Wellness Visit Persons participating in visit and providing information:: patient Medicare Wellness Visit Mode:: In-person (required for WTM) Interpreter Needed?: No Pre-visit prep was completed: no AWV questionnaire completed by patient prior to visit?: no Living arrangements:: (!) lives alone Patient's Overall Health Status Rating: (!) fair Typical amount of pain: some Does pain affect daily life?: (!) yes Are you currently prescribed opioids?: (!) yes  Dietary Habits and Nutritional Risks How many meals a day?: 2 Eats fruit and vegetables daily?: yes Most meals are obtained by: preparing own meals In the last 2 weeks, have you had any of the following?: none Diabetic:: (!) yes Any non-healing wounds?: no How often do you check your BS?: 1 Would you like to be referred to a Nutritionist or for Diabetic Management? : no  Functional Status Activities of Daily Living (to include ambulation/medication): Independent Ambulation: Independent Medication Administration: Independent Home Management (perform basic housework or laundry): Independent Manage your own finances?: yes Primary transportation is: driving Concerns about vision?: no *vision screening is required for WTM* Concerns about hearing?: no  Fall Screening Falls in the past year?: 0 Number of falls in past year: 0 Was there an injury with Fall?: 0 Fall Risk Category Calculator: 0 Patient Fall Risk Level: Low Fall Risk  Fall Risk Patient at Risk for Falls Due to: Impaired balance/gait Fall risk Follow up: Falls evaluation completed  Home and Transportation Safety: All rugs have non-skid backing?: yes All stairs or steps have railings?: (!) no Grab bars in the bathtub  or shower?: yes Have non-skid surface in bathtub or shower?: yes Good home lighting?: yes Regular seat belt use?: yes Hospital stays in the last year:: no  Cognitive Assessment Difficulty concentrating, remembering, or making decisions? : no Will 6CIT or Mini Cog be Completed: no 6CIT or Mini Cog Declined: patient declined  Advance Directives (For Healthcare) Does Patient Have a Medical Advance Directive?: No Would patient like information on creating a medical advance directive?: No - Patient declined  Reviewed/Updated  Reviewed/Updated: Reviewed All (Medical, Surgical, Family, Medications, Allergies, Care Teams, Patient Goals)    Allergies (verified) Latex and Abilify  [aripiprazole ]   Current Medications (verified) Outpatient Encounter Medications as of 09/21/2024  Medication Sig   Accu-Chek Softclix Lancets lancets 1 EACH BY DOES NOT APPLY ROUTE AS DIRECTED. DISPENSE BASED ON PATIENT AND INSURANCE PREFERENCE. USE DAILY.   albuterol  (PROVENTIL ) (2.5 MG/3ML) 0.083% nebulizer solution Take 3 mLs (2.5 mg total) by nebulization every 6 (six) hours as needed for wheezing or shortness of breath.   albuterol  (VENTOLIN  HFA) 108 (90 Base) MCG/ACT inhaler Inhale 2 puffs into the lungs every 4 (four) hours as needed for wheezing or shortness of breath.   ALPRAZolam  (XANAX ) 0.5 MG tablet Take 1 tablet (0.5 mg total) by mouth 3 (three) times daily.   amLODipine  (NORVASC ) 5 MG tablet TAKE 1 TABLET BY MOUTH DAILY   Aspirin -Acetaminophen  (GOODYS BODY PAIN PO) Take 1 packet by mouth daily.   baclofen (LIORESAL) 10 MG tablet Take 10 mg by mouth 3 (three) times daily as needed.   blood glucose meter kit and supplies KIT Dispense based on patient and insurance preference. Use up to four times daily as directed.   Blood Glucose Monitoring Suppl (  ACCU-CHEK GUIDE ME) w/Device KIT USE AS DIRECTED   buPROPion  (WELLBUTRIN  XL) 150 MG 24 hr tablet TAKE 1 TABLET BY MOUTH ONCE DAILY   chlorthalidone   (HYGROTON ) 25 MG tablet TAKE 1/2 TABLET BY MOUTH EVERY DAY   clopidogrel  (PLAVIX ) 75 MG tablet TAKE 1 TABLET BY MOUTH EVERY MORNING   ezetimibe  (ZETIA ) 10 MG tablet TAKE 1 TABLET BY MOUTH ONCE DAILY   fenofibrate  160 MG tablet TAKE 1 TABLET BY MOUTH EVERY MORNING   glucose blood (ONETOUCH ULTRA) test strip DISPENSE BASED ON PATIENT AND INSURANCE PREFERENCE. USE daily   lamoTRIgine  (LAMICTAL ) 25 MG tablet TAKE 2 TABLETS BY MOUTH AT BEDTIME   lansoprazole  (PREVACID ) 30 MG capsule Take 1 capsule (30 mg total) by mouth daily before breakfast.   meclizine  (ANTIVERT ) 25 MG tablet Take 1 tablet (25 mg total) by mouth 3 (three) times daily as needed for dizziness.   MYRBETRIQ  50 MG TB24 tablet Take 50 mg by mouth daily.   naloxone (NARCAN) nasal spray 4 mg/0.1 mL    nitroGLYCERIN  (NITROSTAT ) 0.4 MG SL tablet Dissolve 1 tab under tongue as needed for chest pain. May repeat every 5 minutes x 2 doses. If no relief call 9-1-1.   oxyCODONE -acetaminophen  (PERCOCET) 10-325 MG tablet Take 0.5 tablets by mouth in the morning and at bedtime.   OZEMPIC , 2 MG/DOSE, 8 MG/3ML SOPN INJECT 2MG  SUBCUTANEOUSLY ONCE WEEKLY   predniSONE  (DELTASONE ) 50 MG tablet Take 1 tablet (50 mg total) by mouth daily with breakfast.   rosuvastatin  (CRESTOR ) 20 MG tablet TAKE 1 TABLET BY MOUTH AT BEDTIME   solifenacin  (VESICARE ) 5 MG tablet Take 1 tablet (5 mg total) by mouth daily.   torsemide  (DEMADEX ) 20 MG tablet TAKE 1 TABLET BY MOUTH DAILY   TRELEGY ELLIPTA 200-62.5-25 MCG/ACT AEPB Inhale 1 puff into the lungs daily.   TRUEplus Lancets 30G MISC 1 each by Does not apply route daily. E11.69   valACYclovir  (VALTREX ) 1000 MG tablet TAKE 2 TABLETS BY MOUTH TWICE DAILY AS NEEDED   valsartan  (DIOVAN ) 80 MG tablet Take 1 tablet (80 mg total) by mouth daily.   VASCEPA  1 g capsule TAKE 2 CAPSULES BY MOUTH TWICE DAILY   [DISCONTINUED] ALPRAZolam  (XANAX ) 0.5 MG tablet Take 1 tablet (0.5 mg total) by mouth 3 (three) times daily.    [DISCONTINUED] lansoprazole  (PREVACID ) 30 MG capsule Take 1 capsule (30 mg total) by mouth daily before breakfast.   No facility-administered encounter medications on file as of 09/21/2024.    History: Past Medical History:  Diagnosis Date   Anxiety    CAD (coronary artery disease)    a. NSTEMI 11/2008 s/p DES to LCx (3.0x12 Xience); b. NSTEMI 01/2010 secondary to thrombotic RCA lesion (non-obstructive)-->med rx (integrilin x 24 hrs + plavix ); c. 09/2012 negative Myoview .   Chronic diastolic CHF (congestive heart failure) (HCC)    a. 06/2014 Echo: EF 55-60%, no rwma, Gr1 DD, mild AI.   Depression    Dizziness    Drug induced constipation    Ganglion cyst of left foot 01/17/2021   Generalized hyperhidrosis    GERD (gastroesophageal reflux disease)    Headache    Hyperlipidemia    Hypertensive heart disease    Lumbar disc disease    Metabolic encephalopathy    Mixed hyperlipidemia    Myocardial infarction (HCC)    Obstructive sleep apnea    OP (osteoporosis)    Osteoarthritis    Osteoporosis    Other malaise    Overweight(278.02)    PAD (  peripheral artery disease)    a. Emboli to R foot 2010 from partially occlusive lesion in R EIA, s/p stenting. - followed by Dr. Oris;  b. 10/2015 ABIs: R 1.03, L 0.97.   Restless leg    Sleep apnea    Stroke [place])    TIA (transient ischemic attack)    Tobacco abuse    Urge incontinence    Past Surgical History:  Procedure Laterality Date   CYST EXCISION Left 01/2021   left foot   EYE SURGERY     at age 16   hysterectomy -age 60     ILIAC ARTERY STENT     RIGHT ILIAC STENT   KNEE ARTHROSCOPY     LEFT HEART CATH AND CORONARY ANGIOGRAPHY N/A 03/05/2022   Procedure: LEFT HEART CATH AND CORONARY ANGIOGRAPHY;  Surgeon: Dann Candyce RAMAN, MD;  Location: [facility];  Service: Cardiovascular;  Laterality: N/A;   LITHOTRIPSY Left 12/2020   LUMBAR LAMINECTOMY     TUBAL LIGATION     Family History  Problem Relation Age of Onset    Alzheimer's disease Mother    Hodgkin's lymphoma Brother    Lung cancer Brother    Hepatitis C Brother    Hypothyroidism Brother    Hypertension Brother    Depression Brother    Heart disease Other        Grandfather   Social History   Occupational History   Occupation: DISABLED    Employer: UNEMPLOYED  Tobacco Use   Smoking status: Every Day    Current packs/day: 0.00    Average packs/day: 2.0 packs/day for 54.0 years (108.0 ttl pk-yrs)    Types: Cigarettes    Start date: 11/20/1958    Last attempt to quit: 11/20/2012    Years since quitting: 11.8   Smokeless tobacco: Never   Tobacco comments:    2 ppd for many years.     04/13/2022 Patient smokes 1/2 pack daily or more if she is nervous  Substance and Sexual Activity   Alcohol use: No    Alcohol/week: 0.0 standard drinks of alcohol   Drug use: No   Sexual activity: Never   Tobacco Counseling Ready to quit: Not Answered Counseling given: Not Answered Tobacco comments: 2 ppd for many years.  04/13/2022 Patient smokes 1/2 pack daily or more if she is nervous  SDOH Screenings   Food Insecurity: No Food Insecurity (09/21/2024)  Housing: Low Risk (09/21/2024)  Transportation Needs: No Transportation Needs (09/21/2024)  Utilities: Not At Risk (09/21/2024)  Alcohol Screen: Low Risk (09/21/2024)  Depression (PHQ2-9): High Risk (09/21/2024)  Financial Resource Strain: Low Risk (09/21/2024)  Physical Activity: Inactive (09/21/2024)  Social Connections: Socially Isolated (09/21/2024)  Stress: No Stress Concern Present (09/21/2024)  Tobacco Use: High Risk (09/21/2024)  Health Literacy: Adequate Health Literacy (09/21/2024)   See flowsheets for full screening details  Depression Screen PHQ 2 & 9 Depression Scale- Over the past 2 weeks, how often have you been bothered by any of the following problems? Little interest or pleasure in doing things: 3 Feeling down, depressed, or hopeless (PHQ Adolescent also  includes...irritable): 3 PHQ-2 Total Score: 6 Trouble falling or staying asleep, or sleeping too much: 0 Feeling tired or having little energy: 3 Poor appetite or overeating (PHQ Adolescent also includes...weight loss): 1 Feeling bad about yourself - or that you are a failure or have let yourself or your family down: 1 Trouble concentrating on things, such as reading the newspaper or watching television (PHQ  Adolescent also includes...like school work): 0 Moving or speaking so slowly that other people could have noticed. Or the opposite - being so fidgety or restless that you have been moving around a lot more than usual: 1 Thoughts that you would be better off dead, or of hurting yourself in some way: 0 PHQ-9 Total Score: 12 If you checked off any problems, how difficult have these problems made it for you to do your work, take care of things at home, or get along with other people?: Somewhat difficult  Depression Treatment Depression Interventions/Treatment : Medication; Counseling     Goals Addressed   None          Objective:    Today's Vitals   09/21/24 1336  BP: 118/64  Pulse: 87  Temp: 98 F (36.7 C)  SpO2: 98%  Weight: 150 lb (68 kg)  Height: 5' 5 (1.651 m)   Body mass index is 24.96 kg/m.  Physical Exam Vitals reviewed.  Constitutional:      Appearance: Normal appearance. She is normal weight.  Cardiovascular:     Rate and Rhythm: Normal rate and regular rhythm.     Heart sounds: Normal heart sounds.  Pulmonary:     Effort: Pulmonary effort is normal. No respiratory distress.     Breath sounds: Normal breath sounds.  Neurological:     Mental Status: She is alert and oriented to person, place, and time.  Psychiatric:        Mood and Affect: Mood normal.        Behavior: Behavior normal.     Hearing/Vision screen No results found. Immunizations and Health Maintenance Health Maintenance  Topic Date Due   DTaP/Tdap/Td (1 - Tdap) Never done    Colonoscopy  Never done   Zoster Vaccines- Shingrix (1 of 2) Never done   COVID-19 Vaccine (3 - 2025-26 season) 10/07/2024 (Originally 05/25/2024)   OPHTHALMOLOGY EXAM  10/12/2024 (Originally 09/15/2024)   Lung Cancer Screening  09/21/2025 (Originally 03/02/2022)   Diabetic kidney evaluation - Urine ACR  01/05/2025   HEMOGLOBIN A1C  01/11/2025   FOOT EXAM  06/30/2025   Diabetic kidney evaluation - eGFR measurement  07/13/2025   Medicare Annual Wellness (AWV)  09/21/2025   Pneumococcal Vaccine: 50+ Years  Completed   Influenza Vaccine  Completed   Bone Density Scan  Completed   Hepatitis C Screening  Completed   Meningococcal B Vaccine  Aged Out        Assessment/Plan:  This is a routine wellness examination for Sonya Small. Assessment & Plan Encounter for Medicare annual wellness exam Education given.  Recommend continue to work on eating healthy diet and exercise. Recommend rails on stairs.     Moderate episode of recurrent major depressive disorder [facility]) Call [place] [facility] to set up appointment. Orders:   ALPRAZolam  (XANAX ) 0.5 MG tablet; Take 1 tablet (0.5 mg total) by mouth 3 (three) times daily.  Colon cancer screening Cologuard ordered.  Orders:   Cologuard    Patient Care Team: Sherre Clapper, MD as PCP - General (Family Medicine) Jordan, [name], MD as PCP - Cardiology (Cardiology) Mardee Franc, MD as Referring Physician (Specialist) Estelle Derry SAUNDERS, MD as Referring Physician (Obstetrics and Gynecology)  I have personally reviewed and noted the following in the patients chart:   Medical and social history Use of alcohol, tobacco or illicit drugs  Current medications and supplements including opioid prescriptions. Functional ability and status Nutritional status Physical activity Advanced directives List of other  physicians Hospitalizations, surgeries, and ER visits in previous 12 months Vitals Screenings to include cognitive, depression, and  falls Referrals and appointments  Orders Placed This Encounter  Procedures   Cologuard   In addition, I have reviewed and discussed with patient certain preventive protocols, quality metrics, and best practice recommendations. A written personalized care plan for preventive services as well as general preventive health recommendations were provided to patient.  Return in 1 year (on 09/21/2025) for awv with a nurse please. .  After Visit Summary: (In Person-Printed) AVS printed and given to the patient  I attest that I have reviewed this visit and agree with the plan scribed by my staff.   Abigail Free, MD [facility] [phone]    "

## 2024-09-21 NOTE — Assessment & Plan Note (Signed)
 Cologuard ordered.  Orders:   Cologuard

## 2024-09-21 NOTE — Assessment & Plan Note (Signed)
 Education given.  Recommend continue to work on eating healthy diet and exercise. Recommend rails on stairs.

## 2024-09-23 LAB — OPHTHALMOLOGY REPORT-SCANNED

## 2024-10-15 ENCOUNTER — Other Ambulatory Visit (HOSPITAL_COMMUNITY): Payer: Self-pay

## 2024-10-15 ENCOUNTER — Telehealth: Payer: Self-pay

## 2024-10-15 ENCOUNTER — Other Ambulatory Visit: Payer: Self-pay | Admitting: Family Medicine

## 2024-10-15 NOTE — Telephone Encounter (Signed)
 Pharmacy Patient Advocate Encounter  Received notification from [facility] MEDICARE that Prior Authorization for Ozempic  has been APPROVED from 10/15/2024 to 09/23/2025. Ran test claim, Copay is $4.90. This test claim was processed through [facility]- copay amounts may vary at other pharmacies due to pharmacy/plan contracts, or as the patient moves through the different stages of their insurance plan.   PA #/Case ID/Reference #: [id number]

## 2024-10-15 NOTE — Telephone Encounter (Signed)
 Pharmacy Patient Advocate Encounter   Received notification from [facility] that prior authorization for Ozempic  is required/requested.   Insurance verification completed.   The patient is insured through [employer].   Per test claim: PA required; PA submitted to above mentioned insurance via Latent Key/confirmation #/EOC [id number] Status is pending

## 2024-10-20 ENCOUNTER — Ambulatory Visit: Admitting: Family Medicine

## 2024-10-21 ENCOUNTER — Ambulatory Visit: Admitting: Family Medicine

## 2024-10-28 ENCOUNTER — Ambulatory Visit: Admitting: Physician Assistant

## 2024-10-28 NOTE — Assessment & Plan Note (Signed)
 Sonya Small

## 2024-10-28 NOTE — Assessment & Plan Note (Signed)
 [name]

## 2024-10-28 NOTE — Progress Notes (Unsigned)
 "  Subjective:  Patient ID: Sonya Small, female    DOB: December 19, 1950  Age: 74 y.o. MRN: [id number]  No chief complaint on file.   HPI: Discussed the use of AI scribe software for clinical note transcription with the patient, who gave verbal consent to proceed.         09/21/2024    2:03 PM 04/07/2024    1:57 PM 01/06/2024    2:07 PM 09/16/2023    3:17 PM 09/09/2023    9:51 AM  Depression screen PHQ 2/9  Decreased Interest 3 3 3  0 0  Down, Depressed, Hopeless 3 3 3  0 0  PHQ - 2 Score 6 6 6  0 0  Altered sleeping 0 3 3 0 0  Tired, decreased energy 3 3 3  0 0  Change in appetite 1 2 2  0 0  Feeling bad or failure about yourself  1 0 3 0 0  Trouble concentrating 0 1 3 0 0  Moving slowly or fidgety/restless 1 0 0 0 0  Suicidal thoughts 0 0 0 0 0  PHQ-9 Score 12 15  20   0  0   Difficult doing work/chores  Somewhat difficult Very difficult Not difficult at all Not difficult at all     Data saved with a previous flowsheet row definition        09/21/2024    1:44 PM  Fall Risk   Falls in the past year? 0  Number falls in past yr: 0  Injury with Fall? 0  Risk for fall due to : Impaired balance/gait  Follow up Falls evaluation completed    Patient Care Team: Sherre Clapper, MD as PCP - General (Family Medicine) Jordan, [name], MD as PCP - Cardiology (Cardiology) Mardee Franc, MD as Referring Physician (Specialist) Estelle Derry SAUNDERS, MD as Referring Physician (Obstetrics and Gynecology)   Review of Systems  Constitutional:  Negative for appetite change, fatigue and fever.  HENT:  Negative for congestion, ear pain, sinus pressure and sore throat.   Respiratory:  Negative for cough, chest tightness, shortness of breath and wheezing.   Cardiovascular:  Negative for chest pain and palpitations.  Gastrointestinal:  Negative for abdominal pain, constipation, diarrhea, nausea and vomiting.  Genitourinary:  Negative for dysuria and hematuria.  Musculoskeletal:  Negative for  arthralgias, back pain, joint swelling and myalgias.  Skin:  Negative for rash.  Neurological:  Negative for dizziness, weakness and headaches.  Psychiatric/Behavioral:  Negative for dysphoric mood. The patient is not nervous/anxious.     Medications Ordered Prior to Encounter[1] Past Medical History:  Diagnosis Date   Anxiety    CAD (coronary artery disease)    a. NSTEMI 11/2008 s/p DES to LCx (3.0x12 Xience); b. NSTEMI 01/2010 secondary to thrombotic RCA lesion (non-obstructive)-->med rx (integrilin x 24 hrs + plavix ); c. 09/2012 negative Myoview .   Chronic diastolic CHF (congestive heart failure) (HCC)    a. 06/2014 Echo: EF 55-60%, no rwma, Gr1 DD, mild AI.   Depression    Dizziness    Drug induced constipation    Ganglion cyst of left foot 01/17/2021   Generalized hyperhidrosis    GERD (gastroesophageal reflux disease)    Headache    Hyperlipidemia    Hypertensive heart disease    Lumbar disc disease    Metabolic encephalopathy    Mixed hyperlipidemia    Myocardial infarction (HCC)    Obstructive sleep apnea    OP (osteoporosis)    Osteoarthritis    Osteoporosis  Other malaise    Overweight(278.02)    PAD (peripheral artery disease)    a. Emboli to R foot 2010 from partially occlusive lesion in R EIA, s/p stenting. - followed by Dr. Oris;  b. 10/2015 ABIs: R 1.03, L 0.97.   Restless leg    Sleep apnea    Stroke [facility])    TIA (transient ischemic attack)    Tobacco abuse    Urge incontinence    Past Surgical History:  Procedure Laterality Date   CYST EXCISION Left 01/2021   left foot   EYE SURGERY     at age 9   hysterectomy -age 61     ILIAC ARTERY STENT     RIGHT ILIAC STENT   KNEE ARTHROSCOPY     LEFT HEART CATH AND CORONARY ANGIOGRAPHY N/A 03/05/2022   Procedure: LEFT HEART CATH AND CORONARY ANGIOGRAPHY;  Surgeon: Dann Candyce RAMAN, MD;  Location: [facility] INVASIVE [facility];  Service: Cardiovascular;  Laterality: N/A;   LITHOTRIPSY Left 12/2020   LUMBAR  LAMINECTOMY     TUBAL LIGATION      Family History  Problem Relation Age of Onset   Alzheimer's disease Mother    Hodgkin's lymphoma Brother    Lung cancer Brother    Hepatitis C Brother    Hypothyroidism Brother    Hypertension Brother    Depression Brother    Heart disease Other        Grandfather   Social History   Socioeconomic History   Marital status: Divorced    Spouse name: Not on file   Number of children: 3   Years of education: 10 th   Highest education level: Not on file  Occupational History   Occupation: DISABLED    Employer: UNEMPLOYED  Tobacco Use   Smoking status: Every Day    Current packs/day: 0.00    Average packs/day: 2.0 packs/day for 54.0 years (108.0 ttl pk-yrs)    Types: Cigarettes    Start date: 11/20/1958    Last attempt to quit: 11/20/2012    Years since quitting: 11.9   Smokeless tobacco: Never   Tobacco comments:    2 ppd for many years.     04/13/2022 Patient smokes 1/2 pack daily or more if she is nervous  Substance and Sexual Activity   Alcohol use: No    Alcohol/week: 0.0 standard drinks of alcohol   Drug use: No   Sexual activity: Never  Other Topics Concern   Not on file  Social History Narrative   Patient is single with 3 children, 1 deceased.   Patient is right handed.   Patient has 10 th grade education.   Patient drinks 5 or more cups daily.   Social Drivers of Health   Tobacco Use: High Risk (10/14/2024)   Received from [facility]   Patient History    Smoking Tobacco Use: Every Day    Smokeless Tobacco Use: Never    Passive Exposure: Not on file  Financial Resource Strain: Low Risk (09/21/2024)   Overall Financial Resource Strain (CARDIA)    Difficulty of Paying Living Expenses: Not hard at all  Food Insecurity: No Food Insecurity (09/21/2024)   Epic    Worried About Radiation Protection Practitioner of Food in the Last Year: Never true    Ran Out of Food in the Last Year: Never true  Transportation Needs: No Transportation Needs  (09/21/2024)   Epic    Lack of Transportation (Medical): No    Lack of Transportation (  Non-Medical): No  Physical Activity: Inactive (09/21/2024)   Exercise Vital Sign    Days of Exercise per Week: 0 days    Minutes of Exercise per Session: 0 min  Stress: No Stress Concern Present (09/21/2024)   [employer] of Occupational Health - Occupational Stress Questionnaire    Feeling of Stress: Not at all  Social Connections: Socially Isolated (09/21/2024)   Social Connection and Isolation Panel    Frequency of Communication with Friends and Family: More than three times a week    Frequency of Social Gatherings with Friends and Family: Once a week    Attends Religious Services: Never    Database Administrator or Organizations: No    Attends Banker Meetings: Never    Marital Status: Divorced  Depression (PHQ2-9): High Risk (09/21/2024)   Depression (PHQ2-9)    PHQ-2 Score: 12  Alcohol Screen: Low Risk (09/21/2024)   Alcohol Screen    Last Alcohol Screening Score (AUDIT): 0  Housing: Low Risk (09/21/2024)   Epic    Unable to Pay for Housing in the Last Year: No    Number of Times Moved in the Last Year: 0    Homeless in the Last Year: No  Utilities: Not At Risk (09/21/2024)   Epic    Threatened with loss of utilities: No  Health Literacy: Adequate Health Literacy (09/21/2024)   B1300 Health Literacy    Frequency of need for help with medical instructions: Never    Objective:  There were no vitals taken for this visit.     09/21/2024    1:36 PM 07/31/2024   10:39 AM 06/30/2024    2:02 PM  BP/Weight  Systolic BP 118 134 112  Diastolic BP 64 86 64  Wt. (Lbs) 150 150 152.8  BMI 24.96 kg/m2 24.96 kg/m2 25.43 kg/m2    Physical Exam  {Perform Simple Foot Exam  Perform Detailed exam:1} {Insert foot Exam (Optional):30965}   Lab Results  Component Value Date   WBC 10.2 07/13/2024   HGB 13.0 07/13/2024   HCT 41.0 07/13/2024   PLT 267 07/13/2024   GLUCOSE  111 (H) 07/13/2024   CHOL 112 07/13/2024   TRIG 104 07/13/2024   HDL 40 07/13/2024   LDLCALC 53 07/13/2024   ALT 17 07/13/2024   AST 23 07/13/2024   NA 143 07/13/2024   K 3.4 (L) 07/13/2024   CL 103 07/13/2024   CREATININE 0.94 07/13/2024   BUN 33 (H) 07/13/2024   CO2 25 07/13/2024   TSH 0.734 04/26/2023   INR 1.0 01/31/2019   HGBA1C 6.9 (H) 07/13/2024    Results for orders placed or performed in visit on 09/25/24  OPHTHALMOLOGY REPORT-SCANNED   Collection Time: 09/23/24 12:38 PM  Result Value Ref Range   HM Diabetic Eye Exam No Retinopathy No Retinopathy   A Comment    .  Assessment & Plan:   Assessment & Plan Diabetic polyneuropathy associated with type 2 diabetes mellitus (HCC)     Hypertensive heart disease with chronic combined systolic and diastolic congestive heart failure (HCC)     Mixed hyperlipidemia     Coronary artery disease involving native coronary artery of native heart without angina pectoris     Severe episode of recurrent major depressive disorder, without psychotic features (HCC)     Chronic nonintractable headache, unspecified headache type     COPD mixed type (HCC)       There is no height or weight on file to calculate BMI.  Assessment and Plan      No orders of the defined types were placed in this encounter.   No orders of the defined types were placed in this encounter.      Follow-up: No follow-ups on file.  An After Visit Summary was printed and given to the patient.    I,Hugo Lybrand M Ida Milbrath,acting as a neurosurgeon for [employer].,have documented all relevant documentation on the behalf of Sonya Nuttle, PA,as directed by  Nola Angles, PA while in the presence of Nola Angles, [place].    Nola Angles, [place] [name] [phone]    [1]  Current Outpatient Medications on File Prior to Visit  Medication Sig Dispense Refill   Accu-Chek Softclix Lancets lancets 1 EACH BY DOES NOT APPLY ROUTE AS DIRECTED.  DISPENSE BASED ON PATIENT AND INSURANCE PREFERENCE. USE DAILY. 100 each 0   albuterol  (PROVENTIL ) (2.5 MG/3ML) 0.083% nebulizer solution Take 3 mLs (2.5 mg total) by nebulization every 6 (six) hours as needed for wheezing or shortness of breath. 75 mL 2   albuterol  (VENTOLIN  HFA) 108 (90 Base) MCG/ACT inhaler Inhale 2 puffs into the lungs every 4 (four) hours as needed for wheezing or shortness of breath.     ALPRAZolam  (XANAX ) 0.5 MG tablet Take 1 tablet (0.5 mg total) by mouth 3 (three) times daily. 90 tablet 0   amLODipine  (NORVASC ) 5 MG tablet TAKE 1 TABLET BY MOUTH DAILY 30 tablet 11   Aspirin -Acetaminophen  (GOODYS BODY PAIN PO) Take 1 packet by mouth daily.     baclofen (LIORESAL) 10 MG tablet Take 10 mg by mouth 3 (three) times daily as needed.     blood glucose meter kit and supplies KIT Dispense based on patient and insurance preference. Use up to four times daily as directed. 1 each 0   Blood Glucose Monitoring Suppl (ACCU-CHEK GUIDE ME) w/Device KIT USE AS DIRECTED 1 kit 0   buPROPion  (WELLBUTRIN  XL) 150 MG 24 hr tablet TAKE 1 TABLET BY MOUTH ONCE DAILY 30 tablet 10   chlorthalidone  (HYGROTON ) 25 MG tablet TAKE 1/2 TABLET BY MOUTH EVERY DAY 45 tablet 1   clopidogrel  (PLAVIX ) 75 MG tablet TAKE 1 TABLET BY MOUTH EVERY MORNING 30 tablet 11   ezetimibe  (ZETIA ) 10 MG tablet TAKE 1 TABLET BY MOUTH ONCE DAILY 30 tablet 11   fenofibrate  160 MG tablet TAKE 1 TABLET BY MOUTH EVERY MORNING 30 tablet 11   glucose blood (ONETOUCH ULTRA) test strip DISPENSE BASED ON PATIENT AND INSURANCE PREFERENCE. USE daily 300 strip 1   lamoTRIgine  (LAMICTAL ) 25 MG tablet TAKE 2 TABLETS BY MOUTH AT BEDTIME 60 tablet 11   lansoprazole  (PREVACID ) 30 MG capsule Take 1 capsule (30 mg total) by mouth daily before breakfast. 30 capsule 11   meclizine  (ANTIVERT ) 25 MG tablet Take 1 tablet (25 mg total) by mouth 3 (three) times daily as needed for dizziness. 30 tablet 0   MYRBETRIQ  50 MG TB24 tablet Take 50 mg by mouth  daily.     naloxone (NARCAN) nasal spray 4 mg/0.1 mL      nitroGLYCERIN  (NITROSTAT ) 0.4 MG SL tablet Dissolve 1 tab under tongue as needed for chest pain. May repeat every 5 minutes x 2 doses. If no relief call 9-1-1. 25 tablet 2   oxyCODONE -acetaminophen  (PERCOCET) 10-325 MG tablet Take 0.5 tablets by mouth in the morning and at bedtime.     OZEMPIC , 2 MG/DOSE, 8 MG/3ML SOPN INJECT 2MG  SUBCUTANEOUSLY ONCE WEEKLY 9 mL 11   predniSONE  (  DELTASONE ) 50 MG tablet Take 1 tablet (50 mg total) by mouth daily with breakfast. 5 tablet 0   rosuvastatin  (CRESTOR ) 20 MG tablet TAKE 1 TABLET BY MOUTH AT BEDTIME 30 tablet 11   solifenacin  (VESICARE ) 5 MG tablet Take 1 tablet (5 mg total) by mouth daily. 90 tablet 1   torsemide  (DEMADEX ) 20 MG tablet TAKE 1 TABLET BY MOUTH DAILY 30 tablet 11   TRELEGY ELLIPTA 200-62.5-25 MCG/ACT AEPB Inhale 1 puff into the lungs daily.     TRUEplus Lancets 30G MISC 1 each by Does not apply route daily. E11.69 300 each 1   valACYclovir  (VALTREX ) 1000 MG tablet TAKE 2 TABLETS BY MOUTH TWICE DAILY AS NEEDED 20 tablet 2   valsartan  (DIOVAN ) 80 MG tablet Take 1 tablet (80 mg total) by mouth daily. 90 tablet 1   VASCEPA  1 g capsule TAKE 2 CAPSULES BY MOUTH TWICE DAILY 120 capsule 10   No current facility-administered medications on file prior to visit.   "

## 2024-11-09 ENCOUNTER — Ambulatory Visit: Admitting: Cardiology

## 2025-09-29 ENCOUNTER — Ambulatory Visit
